# Patient Record
Sex: Female | Born: 1975
Health system: Southern US, Community
[De-identification: ages and names within clinical notes are randomized; demographics above are authoritative.]

## PROBLEM LIST (undated history)

## (undated) DIAGNOSIS — I10 Essential (primary) hypertension: Secondary | ICD-10-CM

## (undated) DIAGNOSIS — K219 Gastro-esophageal reflux disease without esophagitis: Secondary | ICD-10-CM

## (undated) DIAGNOSIS — K861 Other chronic pancreatitis: Secondary | ICD-10-CM

## (undated) DIAGNOSIS — Z765 Malingerer [conscious simulation]: Secondary | ICD-10-CM

## (undated) DIAGNOSIS — M1990 Unspecified osteoarthritis, unspecified site: Secondary | ICD-10-CM

## (undated) DIAGNOSIS — F329 Major depressive disorder, single episode, unspecified: Secondary | ICD-10-CM

## (undated) DIAGNOSIS — F32A Depression, unspecified: Secondary | ICD-10-CM

## (undated) DIAGNOSIS — F319 Bipolar disorder, unspecified: Secondary | ICD-10-CM

## (undated) DIAGNOSIS — M545 Low back pain, unspecified: Secondary | ICD-10-CM

## (undated) DIAGNOSIS — G8929 Other chronic pain: Secondary | ICD-10-CM

## (undated) DIAGNOSIS — E559 Vitamin D deficiency, unspecified: Secondary | ICD-10-CM

## (undated) DIAGNOSIS — M797 Fibromyalgia: Secondary | ICD-10-CM

## (undated) DIAGNOSIS — J45909 Unspecified asthma, uncomplicated: Secondary | ICD-10-CM

## (undated) DIAGNOSIS — E7800 Pure hypercholesterolemia, unspecified: Secondary | ICD-10-CM

## (undated) DIAGNOSIS — Z889 Allergy status to unspecified drugs, medicaments and biological substances status: Secondary | ICD-10-CM

## (undated) DIAGNOSIS — F419 Anxiety disorder, unspecified: Secondary | ICD-10-CM

## (undated) DIAGNOSIS — H5440 Blindness, one eye, unspecified eye: Secondary | ICD-10-CM

## (undated) DIAGNOSIS — R7303 Prediabetes: Secondary | ICD-10-CM

## (undated) DIAGNOSIS — D219 Benign neoplasm of connective and other soft tissue, unspecified: Secondary | ICD-10-CM

## (undated) HISTORY — PX: ENDOMETRIAL ABLATION: SHX621

## (undated) HISTORY — PX: EYE SURGERY: SHX253

## (undated) HISTORY — PX: WISDOM TOOTH EXTRACTION: SHX21

## (undated) HISTORY — DX: Benign neoplasm of connective and other soft tissue, unspecified: D21.9

## (undated) NOTE — *Deleted (*Deleted)
Family Medicine Teaching Service Daily Progress Note Intern Pager: [phone]  Patient name: Holly Mendez Medical record number: [id number] Date of birth: 06-13-1975 Age: 5 y.o. Gender: female  Primary Care Provider: Nestor Ramp, MD Consultants: None Code Status: Full  Pt Overview and Major Events to Date:  Admitted 12/01/2019  Assessment and Plan: Holly Mendez is a 82 y.o. female who presented with severe abdominal pain, nausea, vomiting and was admitted for IV fluid hydration and further evaluation.  Patient's past medical history significant for bipolar affective disorder, hypertension, morbid obesity, left eye blindness, chronic epigastric pain, vitamin D deficiency, chronic pancreatitis.   Abdominal pain ** Patient does not have any signs of infection at this time and no empiric antibiotics will be started. We will continue to treat patient as pancreatitis versus PUD with fluids, antiemetics (QTc 465), Protonix, and IV pain medications.   Continuous cardiac monitoring   Vitals per unit routine, O2 >92%, Activity OOB w/ assistance with sedating medciations   AM Labs: CMP, CBC    IVF while NPO   ADAT   IV Zofran PRN n/v   AM EKG for QTc   IV Morphine 2 mg x1, dose PRN given she is sleeping comfortably   ETOH pending   Chronic Abdominal Pain  Chronic pancreatitis  *** Chronic abdominal pain noted on progress note from March 2021 which spoke about outpatient EGD. No further notes are seen.  In 2018, there is a note that reports patient was previously seen by Dr. Elnoria Howard but that patient was not satisfied with [facility].  Dr. Myrtie Neither reviewed Dr. Haywood Pao records and noted that she has had work-up for constipation, pancreatitis and referred her to pain clinic earlier during the year.  He did not have anything further to add and politely declined to accept transfer of care.  No other notes seen in chart or care everywhere. 10/08/2015 Upper EUS with  Dr. Elnoria Howard showed pancreatic parenchymal abnormalities consisting of hyperechoic strands and lobularity were noted in the entire pancreas.  Home medications include: Nexium 40 mg daily, Pepcid 20 mg twice daily, Zofran as needed, Carafate 1 g 3 times daily AC nightly  Continue home medications when PO   Continue ETOH cessation   Hypertension *** Patient hypertensive in the emergency department 170's/80-90.  On her med list, there is clonidine patch that she wears once weekly (indication: Anger management per progress note 11/07/2017).  Patient reports she has not worn this for 3 weeks.  She is otherwise not on any other antihypertensive medications.  This is likely due to her pain.  Patient denies any chest pain, headaches, visual changes.  Previously on HCTZ. Previous admission notes states that she does not tolerate norvasc.   Will continue to monitor blood pressures.  Pain control   Bipolar affective  *** Per chart review, patient follows at [facility] mental health and receives her medications and therapy there.  Home medications include: Amitriptyline 75 mg, hydroxyzine 25 mg twice daily as needed, Latuda 80 mg daily.  Also takes clonidine patch for anger management issues.  Has not 1 patch in 3 weeks.  Continue home medications when PO   Hold clonidine patch  ETOH use  Patient reports hx of ETOH abuse. Last use last Wednesday per patient.   CIWA   ETOH pending   Substance abuse  Cocaine + on UDS. Hx of cocaine and ETOH use.   TOC consult for abuse resources   Vitamin D deficiency Takes 50,000 units twice  daily vitamin D  Hold while NPO   Asthma, mild intermittent Home medications include Symbicort and albuterol as needed.  Zyrtec 10 mg daily.  Hold while NPO   FEN/GI:   Fluids: LR 125 ml/hr   Electrolytes: replete PRN    Nutrition:  Clear liquid diet  VTE prophylaxis: Lovenox 40 (CrCl>30)   Status is: Observation  The patient remains OBS  appropriate and will d/c before 2 midnights.  Dispo: The patient is from: Home              Anticipated d/c is to: Home              Anticipated d/c date is: 1 day              Patient currently is not medically stable to d/c.        Subjective:  ***  Objective: Temp:  [97.6 F (36.4 C)-98.4 F (36.9 C)] 98.4 F (36.9 C) (11/23 0421) Pulse Rate:  [79-108] 92 (11/23 0421) Resp:  [13-29] 18 (11/23 0421) BP: (101-184)/(70-107) 116/71 (11/23 0421) SpO2:  [97 %-100 %] 100 % (11/23 0421) Weight:  [161 kg] 115 kg (11/22 1021)  Physical Exam: General: *** Cardiovascular: *** Respiratory: *** Abdomen: *** Extremities: ***  Laboratory: Recent Labs  Lab 12/01/19 1010 12/02/19 0509  WBC 7.4 12.0*  HGB 12.7 12.5  HCT 38.9 37.8  PLT 240 296   Recent Labs  Lab 12/01/19 1010 12/02/19 0509  NA 139 138  K 3.5 3.6  CL 103 101  CO2 24 26  BUN 6 7  CREATININE 0.87 1.42*  CALCIUM 9.1 9.2  PROT 7.6 7.3  BILITOT 0.8 1.0  ALKPHOS 107 99  ALT 38 32  AST 34 26  GLUCOSE 134* 104*    Imaging/Diagnostic Tests:  DG ABDOMEN ACUTE WITH 1 VIEW CHEST 12/02/2019 FINDINGS: PA chest: Lungs are clear. Heart is upper normal in size with pulmonary vascularity normal. No adenopathy. Supine and upright abdomen: There is moderate stool in the colon. There is no bowel dilatation or air-fluid level to suggest bowel obstruction. No free air. Probable tiny phlebolith right mid pelvis. IMPRESSION: Moderate stool in colon. No bowel obstruction or free air. No edema or airspace opacity. Heart upper normal in size.  CT ABDOMEN AND PELVIS WITH CONTRAST 12/02/2019 FINDINGS: Hepatobiliary: No hepatic lesions or intrahepatic biliary dilatation. The gallbladder is normal. No common bile duct dilatation. The portal and hepatic veins are patent. Pancreas: No mass, inflammation or ductal dilatation. No peripancreatic fluid collections. Spleen: Normal size. No focal lesions. Small accessory  spleen in the splenic hilum. IMPRESSION: 1. No acute abdominal/pelvic findings, mass lesions or adenopathy. 2. Small stable hiatal hernia. 3. Moderate bladder distention ULTRASOUND ABDOMEN LIMITED RIGHT UPPER QUADRANT 12/02/2019 FINDINGS: Gallbladder: There multiple stones within the gallbladder. No gallbladder wall thickening or pericholecystic fluid. Evaluation for Murphy's sign was limited as the patient was medicated. Common bile duct: Diameter: 6 mm Liver: No focal lesion identified. Within normal limits in parenchymal echogenicity. Portal vein is patent on color Doppler imaging with normal direction of blood flow towards the liver. Other: None. IMPRESSION: Cholelithiasis without definite sonographic evidence of acute cholecystitis.    Fayette Pho, MD 12/02/2019, 8:17 AM PGY-1, [facility] Family Medicine FPTS Intern pager: [phone], text pages welcome

---
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
## 1996-01-10 HISTORY — PX: TUBAL LIGATION: SHX77

## 2000-07-26 ENCOUNTER — Emergency Department (HOSPITAL_COMMUNITY): Admission: EM | Admit: 2000-07-26 | Discharge: 2000-07-26 | Payer: Self-pay | Admitting: Emergency Medicine

## 2001-01-09 HISTORY — PX: DILATION AND CURETTAGE OF UTERUS: SHX78

## 2001-12-13 ENCOUNTER — Emergency Department (HOSPITAL_COMMUNITY): Admission: EM | Admit: 2001-12-13 | Discharge: 2001-12-13 | Payer: Self-pay | Admitting: Emergency Medicine

## 2002-03-20 ENCOUNTER — Emergency Department (HOSPITAL_COMMUNITY): Admission: EM | Admit: 2002-03-20 | Discharge: 2002-03-20 | Payer: Self-pay | Admitting: Emergency Medicine

## 2002-03-22 ENCOUNTER — Emergency Department (HOSPITAL_COMMUNITY): Admission: EM | Admit: 2002-03-22 | Discharge: 2002-03-22 | Payer: Self-pay | Admitting: Emergency Medicine

## 2002-03-23 ENCOUNTER — Emergency Department (HOSPITAL_COMMUNITY): Admission: EM | Admit: 2002-03-23 | Discharge: 2002-03-24 | Payer: Self-pay | Admitting: Emergency Medicine

## 2003-01-10 ENCOUNTER — Emergency Department (HOSPITAL_COMMUNITY): Admission: EM | Admit: 2003-01-10 | Discharge: 2003-01-11 | Payer: Self-pay

## 2004-09-28 ENCOUNTER — Emergency Department (HOSPITAL_COMMUNITY): Admission: EM | Admit: 2004-09-28 | Discharge: 2004-09-28 | Payer: Self-pay | Admitting: Emergency Medicine

## 2005-03-14 ENCOUNTER — Ambulatory Visit: Payer: Self-pay | Admitting: Family Medicine

## 2005-04-04 ENCOUNTER — Ambulatory Visit: Payer: Self-pay | Admitting: Family Medicine

## 2005-04-07 ENCOUNTER — Ambulatory Visit: Payer: Self-pay | Admitting: *Deleted

## 2005-05-22 ENCOUNTER — Emergency Department (HOSPITAL_COMMUNITY): Admission: EM | Admit: 2005-05-22 | Discharge: 2005-05-23 | Payer: Self-pay | Admitting: Emergency Medicine

## 2005-05-24 ENCOUNTER — Ambulatory Visit: Payer: Self-pay | Admitting: Family Medicine

## 2005-05-26 ENCOUNTER — Ambulatory Visit: Payer: Self-pay | Admitting: Family Medicine

## 2005-06-05 ENCOUNTER — Inpatient Hospital Stay (HOSPITAL_COMMUNITY): Admission: AD | Admit: 2005-06-05 | Discharge: 2005-06-05 | Payer: Self-pay | Admitting: Gynecology

## 2005-06-29 ENCOUNTER — Ambulatory Visit: Payer: Self-pay | Admitting: Obstetrics & Gynecology

## 2005-10-26 ENCOUNTER — Ambulatory Visit: Payer: Self-pay | Admitting: Obstetrics and Gynecology

## 2005-12-21 ENCOUNTER — Ambulatory Visit: Payer: Self-pay | Admitting: Obstetrics and Gynecology

## 2005-12-21 ENCOUNTER — Ambulatory Visit (HOSPITAL_COMMUNITY): Admission: RE | Admit: 2005-12-21 | Discharge: 2005-12-21 | Payer: Self-pay | Admitting: Obstetrics and Gynecology

## 2006-12-13 ENCOUNTER — Emergency Department (HOSPITAL_COMMUNITY): Admission: EM | Admit: 2006-12-13 | Discharge: 2006-12-13 | Payer: Self-pay | Admitting: Emergency Medicine

## 2009-04-04 ENCOUNTER — Emergency Department (HOSPITAL_COMMUNITY): Admission: EM | Admit: 2009-04-04 | Discharge: 2009-04-04 | Payer: Self-pay | Admitting: Family Medicine

## 2009-08-29 ENCOUNTER — Emergency Department (HOSPITAL_COMMUNITY): Admission: EM | Admit: 2009-08-29 | Discharge: 2009-08-29 | Payer: Self-pay | Admitting: Emergency Medicine

## 2009-08-30 ENCOUNTER — Emergency Department (HOSPITAL_COMMUNITY): Admission: EM | Admit: 2009-08-30 | Discharge: 2009-08-30 | Payer: Self-pay | Admitting: Emergency Medicine

## 2009-09-02 ENCOUNTER — Ambulatory Visit: Payer: Self-pay | Admitting: Psychiatry

## 2009-09-02 ENCOUNTER — Observation Stay (HOSPITAL_COMMUNITY): Admission: EM | Admit: 2009-09-02 | Discharge: 2009-09-03 | Payer: Self-pay | Admitting: Emergency Medicine

## 2009-10-05 ENCOUNTER — Emergency Department (HOSPITAL_COMMUNITY): Admission: EM | Admit: 2009-10-05 | Discharge: 2009-10-05 | Payer: Self-pay | Admitting: Family Medicine

## 2009-11-19 ENCOUNTER — Ambulatory Visit: Payer: Self-pay | Admitting: Psychiatry

## 2010-03-24 LAB — CBC
HCT: 39.5 % (ref 36.0–46.0)
MCH: 28.4 pg (ref 26.0–34.0)
MCHC: 34.9 g/dL (ref 30.0–36.0)
MCV: 81.3 fL (ref 78.0–100.0)
Platelets: 266 10*3/uL (ref 150–400)
WBC: 11.5 10*3/uL — ABNORMAL HIGH (ref 4.0–10.5)

## 2010-03-24 LAB — URINE CULTURE: Culture  Setup Time: 201108222324

## 2010-03-24 LAB — DIFFERENTIAL
Basophils Absolute: 0 10*3/uL (ref 0.0–0.1)
Eosinophils Absolute: 0 10*3/uL (ref 0.0–0.7)
Monocytes Absolute: 1.2 10*3/uL — ABNORMAL HIGH (ref 0.1–1.0)
Monocytes Relative: 11 % (ref 3–12)
Neutro Abs: 7.9 10*3/uL — ABNORMAL HIGH (ref 1.7–7.7)
Neutrophils Relative %: 69 % (ref 43–77)

## 2010-03-24 LAB — COMPREHENSIVE METABOLIC PANEL
ALT: 37 U/L — ABNORMAL HIGH (ref 0–35)
Albumin: 3.5 g/dL (ref 3.5–5.2)
BUN: 9 mg/dL (ref 6–23)
CO2: 31 mEq/L (ref 19–32)
Calcium: 9 mg/dL (ref 8.4–10.5)
Chloride: 99 mEq/L (ref 96–112)
Creatinine, Ser: 0.82 mg/dL (ref 0.4–1.2)
Glucose, Bld: 124 mg/dL — ABNORMAL HIGH (ref 70–99)
Potassium: 3.9 mEq/L (ref 3.5–5.1)
Sodium: 138 mEq/L (ref 135–145)
Total Protein: 7.4 g/dL (ref 6.0–8.3)

## 2010-03-24 LAB — URINALYSIS, ROUTINE W REFLEX MICROSCOPIC
Hgb urine dipstick: NEGATIVE
Ketones, ur: NEGATIVE mg/dL
Nitrite: NEGATIVE
Protein, ur: 30 mg/dL — AB
Specific Gravity, Urine: 1.031 — ABNORMAL HIGH (ref 1.005–1.030)
pH: 7.5 (ref 5.0–8.0)

## 2010-03-24 LAB — LIPASE, BLOOD: Lipase: 39 U/L (ref 11–59)

## 2010-03-24 LAB — URINE MICROSCOPIC-ADD ON

## 2010-03-25 LAB — POCT PREGNANCY, URINE
Preg Test, Ur: NEGATIVE
Preg Test, Ur: NEGATIVE

## 2010-03-25 LAB — URINALYSIS, ROUTINE W REFLEX MICROSCOPIC
Bilirubin Urine: NEGATIVE
Glucose, UA: NEGATIVE mg/dL
Hgb urine dipstick: NEGATIVE
Nitrite: NEGATIVE
Specific Gravity, Urine: 1.02 (ref 1.005–1.030)
Urobilinogen, UA: 0.2 mg/dL (ref 0.0–1.0)
pH: 7 (ref 5.0–8.0)

## 2010-03-25 LAB — CBC
HCT: 40 % (ref 36.0–46.0)
HCT: 41.4 % (ref 36.0–46.0)
Hemoglobin: 13.8 g/dL (ref 12.0–15.0)
Hemoglobin: 14.1 g/dL (ref 12.0–15.0)
MCH: 28.4 pg (ref 26.0–34.0)
MCHC: 33.8 g/dL (ref 30.0–36.0)
MCHC: 33.9 g/dL (ref 30.0–36.0)
Platelets: 265 10*3/uL (ref 150–400)
RBC: 4.81 MIL/uL (ref 3.87–5.11)
RDW: 14.8 % (ref 11.5–15.5)
WBC: 11.1 10*3/uL — ABNORMAL HIGH (ref 4.0–10.5)

## 2010-03-25 LAB — COMPREHENSIVE METABOLIC PANEL
ALT: 35 U/L (ref 0–35)
ALT: 38 U/L — ABNORMAL HIGH (ref 0–35)
AST: 26 U/L (ref 0–37)
AST: 26 U/L (ref 0–37)
Alkaline Phosphatase: 76 U/L (ref 39–117)
Alkaline Phosphatase: 89 U/L (ref 39–117)
CO2: 29 mEq/L (ref 19–32)
Calcium: 8.7 mg/dL (ref 8.4–10.5)
Calcium: 9.1 mg/dL (ref 8.4–10.5)
Calcium: 9.4 mg/dL (ref 8.4–10.5)
Chloride: 95 mEq/L — ABNORMAL LOW (ref 96–112)
Chloride: 99 mEq/L (ref 96–112)
GFR calc Af Amer: >60 mL/min (ref >60)
GFR calc Af Amer: >60 mL/min (ref >60)
GFR calc Af Amer: >60 mL/min (ref >60)
GFR calc non Af Amer: >60 mL/min (ref >60)
GFR calc non Af Amer: >60 mL/min (ref >60)
GFR calc non Af Amer: >60 mL/min (ref >60)
Glucose, Bld: 113 mg/dL — ABNORMAL HIGH (ref 70–99)
Glucose, Bld: 135 mg/dL — ABNORMAL HIGH (ref 70–99)
Potassium: 3 mEq/L — ABNORMAL LOW (ref 3.5–5.1)
Potassium: 3.4 mEq/L — ABNORMAL LOW (ref 3.5–5.1)
Potassium: 3.8 mEq/L (ref 3.5–5.1)
Sodium: 135 mEq/L (ref 135–145)
Total Bilirubin: 0.6 mg/dL (ref 0.3–1.2)
Total Bilirubin: 0.7 mg/dL (ref 0.3–1.2)

## 2010-03-25 LAB — LIPASE, BLOOD
Lipase: 27 U/L (ref 11–59)
Lipase: 97 U/L — ABNORMAL HIGH (ref 11–59)

## 2010-03-25 LAB — URINE MICROSCOPIC-ADD ON

## 2010-03-25 LAB — DIFFERENTIAL
Basophils Absolute: 0.1 10*3/uL (ref 0.0–0.1)
Basophils Relative: 0 % (ref 0–1)
Eosinophils Absolute: 0 10*3/uL (ref 0.0–0.7)
Eosinophils Absolute: 0 10*3/uL (ref 0.0–0.7)
Eosinophils Relative: 0 % (ref 0–5)
Lymphocytes Relative: 13 % (ref 12–46)
Lymphocytes Relative: 31 % (ref 12–46)
Lymphs Abs: 1.2 10*3/uL (ref 0.7–4.0)
Lymphs Abs: 4 10*3/uL (ref 0.7–4.0)
Monocytes Absolute: 0.1 10*3/uL (ref 0.1–1.0)
Monocytes Absolute: 1.4 10*3/uL — ABNORMAL HIGH (ref 0.1–1.0)
Monocytes Relative: 2 % — ABNORMAL LOW (ref 3–12)
Neutro Abs: 7.2 10*3/uL (ref 1.7–7.7)
Neutro Abs: 7.9 10*3/uL — ABNORMAL HIGH (ref 1.7–7.7)
Neutrophils Relative %: 86 % — ABNORMAL HIGH (ref 43–77)

## 2010-03-25 LAB — CULTURE, BLOOD (ROUTINE X 2): Culture: NO GROWTH

## 2010-03-25 LAB — RAPID URINE DRUG SCREEN, HOSP PERFORMED
Amphetamines: NOT DETECTED
Barbiturates: NOT DETECTED
Opiates: NOT DETECTED

## 2010-03-25 LAB — PHOSPHORUS: Phosphorus: 3.1 mg/dL (ref 2.3–4.6)

## 2010-03-25 LAB — URINE CULTURE: Culture  Setup Time: 201108261756

## 2010-03-25 LAB — ETHANOL: Alcohol, Ethyl (B): 5 mg/dL (ref 0–10)

## 2010-03-25 LAB — MAGNESIUM: Magnesium: 2.6 mg/dL — ABNORMAL HIGH (ref 1.5–2.5)

## 2010-04-01 LAB — CBC
MCHC: 33.5 g/dL (ref 30.0–36.0)
Platelets: 268 10*3/uL (ref 150–400)
RDW: 15.3 % (ref 11.5–15.5)

## 2010-04-01 LAB — COMPREHENSIVE METABOLIC PANEL
ALT: 27 U/L (ref 0–35)
AST: 26 U/L (ref 0–37)
Calcium: 9.1 mg/dL (ref 8.4–10.5)
GFR calc Af Amer: >60 mL/min (ref >60)
Sodium: 138 mEq/L (ref 135–145)
Total Protein: 7.9 g/dL (ref 6.0–8.3)

## 2010-04-01 LAB — POCT URINALYSIS DIP (DEVICE)
Nitrite: NEGATIVE
Protein, ur: NEGATIVE mg/dL
pH: 6 (ref 5.0–8.0)

## 2010-04-01 LAB — LIPASE, BLOOD: Lipase: 20 U/L (ref 11–59)

## 2010-05-27 NOTE — Op Note (Signed)
[name], Holly Mendez           ACCOUNT NO.:  [id number]   MEDICAL RECORD NO.:  [id number]          PATIENT TYPE:  AMB   LOCATION:  SDC                           [facility]  [facility]   PHYSICIAN:  Phil D. Okey Dupre, M.D.     DATE OF BIRTH:  08-Nov-1975   DATE OF PROCEDURE:  12/21/2005  DATE OF DISCHARGE:                               OPERATIVE REPORT   PROCEDURE:  Hydrothermal ablation of the endometrium.   POSTOPERATIVE DIAGNOSIS:  Intractable menometrorrhagia with secondary  anemia.   SURGEON:  Dr. Okey Dupre   ANESTHESIA:  General.   SPECIMENS TO PATHOLOGY:  None.   POSTOPERATIVE CONDITION:  Satisfactory.   ESTIMATED BLOOD LOSS:  Under satisfactory general anesthesia with the  patient in dorsal lithotomy position, perineum, vagina were prepped and  draped in usual sterile manner.  Bimanual pelvic examination under  anesthesia revealed the uterus of normal size, shape, consistency,  anteflexed, freely movable with a free cul-de-sac. Adnexa could not be  well palpated. A weighted speculum was placed in the posterior  fourchette of the vagina through a marital introitus, BUS within normal  limits.  Vagina is clean and well rugated. The cervix was clean and  parous and grasped with single-tooth tenaculum.  The uterine cavity was  sounded to a depth of 8 cm. Cervical os dilated to a #8 Hagar dilator  and the endometrial ablation device inserted with a hysteroscope into  the uterine cavity where the entire cavity was easily visualized.  Both  ostia were seen with no sign of any leiomyomata that could be  identified.  There was a thickened endometrium with many hyperemic  areas. The usual precautions were used to make sure there was no  leakage. The circulating hydrothermal ablation machine was put into  effect and at 85-90 degrees cGy Celsius the ablation took place for 10  minutes with 1-minute cooling down period.  There was no sign of any  burning or injury to any other organ. Blanching  occurred to the entire  endometrium which was easily visualized. The tenaculum and speculum  removed from vagina.  The patient was transferred to recovery room in  satisfactory condition having tolerated procedure well.           ______________________________  Holly Mendez. Okey Dupre, M.D.     PDR/MEDQ  D:  12/21/2005  T:  12/21/2005  Job:  [id number]

## 2010-05-27 NOTE — Group Therapy Note (Signed)
NAME:  Holly Mendez, Holly Mendez NO.:  [id number]   MEDICAL RECORD NO.:  [id number]          PATIENT TYPE:  WOC   LOCATION:  [facility]                   FACILITY:  [facility]   PHYSICIAN:  Argentina Donovan, MD        DATE OF BIRTH:  [birth year]   DATE OF SERVICE:  10/26/2005                                    CLINIC NOTE   The patient is a 35 year old, African-American, gravida 3, para 3-0-0-3, who  has had persistent dysfunctional uterine bleeding, saw Dr. Perlie Gold in June,  and was placed on Loestrin to control the bleeding that did control the  bleeding.  The patient is back but since she ran out she has continued to  bleed again.  She has had a tubal ligation, she does not want to take  hormones anymore, and we offered her endometrial ablation as an alternative,  and she has decided this is what she wants to try.  We are going to have her  admitted for thermal hydroablation of the endometrium.  The patient has a  fairly good medical history.  She is ALLERGIC TO ZITHROMAX and NORVASC.  She  does not smoke, drink, or take illicit drugs, Pap smear normal in March of  2007.  The medicines she takes are Trazodone and Prolixin.  She weighs 225  pounds and is 5 foot 4 inches tall.   DIAGNOSIS:  Persistent dysfunctional uterine bleeding.           ______________________________  Argentina Donovan, MD     PR/MEDQ  D:  10/26/2005  T:  10/28/2005  Job:  [id number]

## 2010-05-27 NOTE — Group Therapy Note (Signed)
Holly Mendez, Holly Mendez NO.:  [id number]   MEDICAL RECORD NO.:  [id number]          PATIENT TYPE:  WOC   LOCATION:  [facility]                   [facility]ACILITY:  [facility]   PHYSICIAN:  Dorthula Perfect, MD     DATE OF BIRTH:  1976-01-06   DATE OF SERVICE:                                    CLINIC NOTE   HISTORY OF PRESENT ILLNESS:  This is a 35 year old African-American female,  gravida 4, para 3, abort of 1 who was seen in the MAU on Jun 05, 2005 with a  history of vaginal bleeding since May 14, 2005.  Prior to that, she had had  normal, cyclic menstrual periods.  She had been seen at [facility]  on May 22, 2004, and was given Provera to take for 10 days.  Her evaluation  on Jun 05, 2005 included a negative pregnancy test.  It included a  hemoglobin of 9.9 gm and a pelvic ultrasound.  The uterus was of normal  size.  The endometrial stripe was upper limits of normal.  Ovaries were  normal.  She was placed on Loestrin 24 Fe to take daily.  She has started  the last 4 brown pills, and will finish those Saturday.  Her bleeding  stopped about a week and a half ago.   PAST MEDICAL HISTORY:  Noncontributory.   REVIEW OF SYSTEMS:  Noncontributory.   PHYSICAL EXAMINATION:  ABDOMEN:  Soft, nontender, and somewhat obese.  PELVIC:  External genitalia and BUS glands are normal.  Vaginal wall was  epithelialized as was the cervix.  She is starting her menstrual period.  The uterus is in the upper limits of normal size.  The ovaries are not  palpable.  There are no separate masses.   DIAGNOSIS:  Abnormal uterine bleeding.   DISPOSITION:  Patient will continue Loestrin 58 Fe for another 3 cycles.  She is given samples.  She will be seen again here in 4 months for followup.  I anticipate that this will correct her problem.           ______________________________  Dorthula Perfect, MD     ER/MEDQ  D:  06/29/2005  T:  06/29/2005  Job:  [id number]

## 2011-01-10 HISTORY — PX: UPPER GASTROINTESTINAL ENDOSCOPY: SHX188

## 2011-01-10 HISTORY — PX: ESOPHAGOGASTRODUODENOSCOPY: SHX1529

## 2011-02-03 ENCOUNTER — Emergency Department (HOSPITAL_COMMUNITY): Payer: Medicaid Other

## 2011-02-03 ENCOUNTER — Emergency Department (INDEPENDENT_AMBULATORY_CARE_PROVIDER_SITE_OTHER)
Admission: EM | Admit: 2011-02-03 | Discharge: 2011-02-03 | Disposition: A | Discharge status: Nursing Home (Includes ICF) | Payer: Self-pay | Source: Home / Self Care

## 2011-02-03 ENCOUNTER — Encounter (HOSPITAL_COMMUNITY): Payer: Self-pay | Admitting: Emergency Medicine

## 2011-02-03 ENCOUNTER — Emergency Department (HOSPITAL_COMMUNITY)
Admission: EM | Admit: 2011-02-03 | Discharge: 2011-02-03 | Disposition: A | Discharge status: Home / Self Care | Payer: Medicaid Other | Attending: Emergency Medicine | Admitting: Emergency Medicine

## 2011-02-03 ENCOUNTER — Encounter (HOSPITAL_COMMUNITY): Payer: Self-pay

## 2011-02-03 DIAGNOSIS — R112 Nausea with vomiting, unspecified: Secondary | ICD-10-CM | POA: Insufficient documentation

## 2011-02-03 DIAGNOSIS — K859 Acute pancreatitis without necrosis or infection, unspecified: Secondary | ICD-10-CM

## 2011-02-03 DIAGNOSIS — R1032 Left lower quadrant pain: Secondary | ICD-10-CM | POA: Insufficient documentation

## 2011-02-03 DIAGNOSIS — F319 Bipolar disorder, unspecified: Secondary | ICD-10-CM | POA: Insufficient documentation

## 2011-02-03 DIAGNOSIS — R109 Unspecified abdominal pain: Secondary | ICD-10-CM

## 2011-02-03 DIAGNOSIS — I10 Essential (primary) hypertension: Secondary | ICD-10-CM | POA: Insufficient documentation

## 2011-02-03 DIAGNOSIS — Z79899 Other long term (current) drug therapy: Secondary | ICD-10-CM | POA: Insufficient documentation

## 2011-02-03 HISTORY — DX: Bipolar disorder, unspecified: F31.9

## 2011-02-03 HISTORY — DX: Essential (primary) hypertension: I10

## 2011-02-03 LAB — COMPREHENSIVE METABOLIC PANEL
ALT: 20 U/L (ref 0–35)
AST: 19 U/L (ref 0–37)
Albumin: 3.9 g/dL (ref 3.5–5.2)
Alkaline Phosphatase: 92 U/L (ref 39–117)
BUN: 8 mg/dL (ref 6–23)
Chloride: 98 mEq/L (ref 96–112)
Potassium: 3.8 mEq/L (ref 3.5–5.1)
Sodium: 136 mEq/L (ref 135–145)
Total Bilirubin: 0.2 mg/dL — ABNORMAL LOW (ref 0.3–1.2)
Total Protein: 8.4 g/dL — ABNORMAL HIGH (ref 6.0–8.3)

## 2011-02-03 LAB — CBC
Hemoglobin: 13.9 g/dL (ref 12.0–15.0)
MCH: 27.6 pg (ref 26.0–34.0)
MCHC: 34.5 g/dL (ref 30.0–36.0)
Platelets: 318 10*3/uL (ref 150–400)
RDW: 15 % (ref 11.5–15.5)

## 2011-02-03 LAB — URINALYSIS, ROUTINE W REFLEX MICROSCOPIC
Bilirubin Urine: NEGATIVE
Glucose, UA: NEGATIVE mg/dL
Ketones, ur: NEGATIVE mg/dL
Leukocytes, UA: NEGATIVE
Protein, ur: 30 mg/dL — AB
Specific Gravity, Urine: 1.023 (ref 1.005–1.030)
Urobilinogen, UA: 0.2 mg/dL (ref 0.0–1.0)
pH: 8.5 — ABNORMAL HIGH (ref 5.0–8.0)

## 2011-02-03 LAB — DIFFERENTIAL
Basophils Absolute: 0 10*3/uL (ref 0.0–0.1)
Basophils Relative: 0 % (ref 0–1)
Eosinophils Absolute: 0.2 10*3/uL (ref 0.0–0.7)
Monocytes Relative: 6 % (ref 3–12)
Neutro Abs: 6.2 10*3/uL (ref 1.7–7.7)
Neutrophils Relative %: 60 % (ref 43–77)

## 2011-02-03 LAB — LIPASE, BLOOD: Lipase: 269 U/L — ABNORMAL HIGH (ref 11–59)

## 2011-02-03 LAB — URINE MICROSCOPIC-ADD ON

## 2011-02-03 LAB — PREGNANCY, URINE: Preg Test, Ur: NEGATIVE

## 2011-02-03 MED ORDER — ONDANSETRON HCL 4 MG/2ML IJ SOLN
4.0000 mg | Freq: Once | INTRAMUSCULAR | Status: AC
  Administered 2011-02-03 (×2): 4 mg via INTRAVENOUS

## 2011-02-03 MED ORDER — ONDANSETRON HCL 4 MG/2ML IJ SOLN
INTRAMUSCULAR | Status: AC
  Administered 2011-02-03: 4 mg
  Filled 2011-02-03: qty 2

## 2011-02-03 MED ORDER — PROMETHAZINE HCL 25 MG PO TABS: 25.0000 mg | ORAL_TABLET | Freq: Three times a day (TID) | ORAL | Status: DC | PRN

## 2011-02-03 MED ORDER — IOHEXOL 300 MG/ML  SOLN
100.0000 mL | Freq: Once | INTRAMUSCULAR | Status: AC | PRN
  Administered 2011-02-03: 100 mL via INTRAVENOUS

## 2011-02-03 MED ORDER — ONDANSETRON HCL 4 MG/2ML IJ SOLN
INTRAMUSCULAR | Status: AC
  Filled 2011-02-03: qty 2

## 2011-02-03 MED ORDER — HYDROMORPHONE HCL PF 1 MG/ML IJ SOLN
1.0000 mg | Freq: Once | INTRAMUSCULAR | Status: AC
  Administered 2011-02-03: 1 mg via INTRAVENOUS

## 2011-02-03 MED ORDER — ONDANSETRON HCL 4 MG/2ML IJ SOLN
INTRAMUSCULAR | Status: AC
  Filled 2011-02-03: qty 4

## 2011-02-03 MED ORDER — HYDROMORPHONE HCL PF 2 MG/ML IJ SOLN
INTRAMUSCULAR | Status: AC
  Administered 2011-02-03: 2 mg
  Filled 2011-02-03: qty 1

## 2011-02-03 MED ORDER — ONDANSETRON HCL 4 MG/2ML IJ SOLN
INTRAMUSCULAR | Status: AC
  Administered 2011-02-03: 4 mg via INTRAVENOUS
  Filled 2011-02-03: qty 2

## 2011-02-03 MED ORDER — ONDANSETRON HCL 4 MG/2ML IJ SOLN
8.0000 mg | Freq: Once | INTRAMUSCULAR | Status: AC
  Administered 2011-02-03: 8 mg via INTRAMUSCULAR

## 2011-02-03 MED ORDER — HYDROMORPHONE HCL PF 1 MG/ML IJ SOLN
INTRAMUSCULAR | Status: AC
  Filled 2011-02-03: qty 1

## 2011-02-03 MED ORDER — OXYCODONE-ACETAMINOPHEN 5-325 MG PO TABS: 1.0000 | ORAL_TABLET | Freq: Four times a day (QID) | ORAL | Status: DC | PRN

## 2011-02-03 NOTE — ED Provider Notes (Signed)
History     CSN: [id number]  Arrival date & time 02/03/11  1038   First MD Initiated Contact with Patient 02/03/11 1048      Chief Complaint  Patient presents with  . Abdominal Pain    PMH of pancreatitis  . Nausea    actively vomiting    (Consider location/radiation/quality/duration/timing/severity/associated sxs/prior treatment) Patient is a 36 y.o. female presenting with abdominal pain. The history is provided by the patient.  Abdominal Pain The primary symptoms of the illness include abdominal pain, nausea and vomiting. The primary symptoms of the illness do not include fever, shortness of breath or hematochezia. The current episode started 2 days ago. The onset of the illness was gradual. The problem has not changed since onset. Associated with: She reports the last alcohol use was 6 months ago. The patient states that she believes she is currently not pregnant. The patient has had a change in bowel habit (She report 3 loose bowel movements today. No melena, no blood.). Symptoms associated with the illness do not include chills. Associated symptoms comments: She has a reported history of pancreatitis and feels the same as today's pain "almost". When asked, she points to the lower LQ abdomen as pain area. .    Past Medical History  Diagnosis Date  . Hypertension   . Pancreatitis   . Bipolar disorder   . Schizoaffective disorder     Past Surgical History  Procedure Date  . Tubal ligation   . Appendectomy     History reviewed. No pertinent family history.  History  Substance Use Topics  . Smoking status: Passive Smoker  . Smokeless tobacco: Not on file  . Alcohol Use: No     NO ETOH FOR 6 MONTHS    OB History    Grav Para Term Preterm Abortions TAB SAB Ect Mult Living                  Review of Systems  Constitutional: Negative for fever and chills.  HENT: Negative.   Respiratory: Negative.  Negative for shortness of breath.   Cardiovascular: Negative.     Gastrointestinal: Positive for nausea, vomiting and abdominal pain. Negative for hematochezia.  Musculoskeletal: Negative.   Skin: Negative.   Neurological: Negative.     Allergies  Norvasc and Zithromax  Home Medications   Current Outpatient Rx  Name Route Sig Dispense Refill  . BENZTROPINE MESYLATE 1 MG PO TABS Oral Take 0.5 mg by mouth 2 (two) times daily.    Marland Kitchen DIVALPROEX SODIUM 250 MG PO TBEC Oral Take 750 mg by mouth every evening.    Marland Kitchen FLUPHENAZINE DECANOATE 25 MG/ML IJ SOLN Intramuscular Inject 12.5 mg into the muscle every 14 (fourteen) days. 0.5 cc    . HYDROXYZINE HCL 25 MG PO TABS Oral Take 75 mg by mouth every evening.    Marland Kitchen LURASIDONE HCL 80 MG PO TABS Oral Take 80 mg by mouth every evening.       Pulse 75  Temp(Src) 97.6 F (36.4 C) (Oral)  Resp 18  Ht 5\' 5"  (1.651 m)  Wt 275 lb (124.739 kg)  BMI 45.76 kg/m2  SpO2 100%  LMP 12/04/2010  Physical Exam  Constitutional: She appears well-developed and well-nourished.  HENT:  Head: Normocephalic.  Neck: Normal range of motion. Neck supple.  Cardiovascular: Normal rate and regular rhythm.   Pulmonary/Chest: Effort normal and breath sounds normal.  Abdominal: Soft. She exhibits no mass. Bowel sounds are decreased. There is no rebound, no  guarding and no CVA tenderness.    Musculoskeletal: Normal range of motion.  Neurological: She is alert. No cranial nerve deficit.  Skin: Skin is warm and dry. No rash noted.  Psychiatric: She has a normal mood and affect.    ED Course  Procedures (including critical care time) Pain has been controlled with medications. No further vomiting in ED. She is drinking contrast for CT scan.  Labs Reviewed - No data to display No results found.   No diagnosis found.    MDM   Tenderness is in LLQ abdomen with mild tenderness in LUQ. Concerned for pathology other than pancreatitis. CT scan pending.        Rodena Medin, PA-C 02/03/11 1453

## 2011-02-03 NOTE — ED Provider Notes (Signed)
History     CSN: [id number]  Arrival date & time 02/03/11  4098   None     Chief Complaint  Patient presents with  . Abdominal Pain  . Emesis    (Consider location/radiation/quality/duration/timing/severity/associated sxs/prior treatment) HPI Comments: Patient presents with onset of left upper quadrant abdominal pain, nausea, vomiting, and diarrhea for 3 days. She denies fever. She states her symptoms are progressing, and she is unable to keep even water down at this time. She states that this feels just like when she had a flare of pancreatitis in the past. She states she was hospitalized with her last pancreatitis flareup which was in August of 2011.   Past Medical History  Diagnosis Date  . Hypertension   . Pancreatitis   . Bipolar disorder   . Schizoaffective disorder     Past Surgical History  Procedure Date  . Tubal ligation     History reviewed. No pertinent family history.  History  Substance Use Topics  . Smoking status: Never Smoker   . Smokeless tobacco: Not on file  . Alcohol Use: No    OB History    Grav Para Term Preterm Abortions TAB SAB Ect Mult Living                  Review of Systems  Constitutional: Positive for chills. Negative for fever and fatigue.  Respiratory: Negative for cough, shortness of breath and wheezing.   Cardiovascular: Positive for palpitations. Negative for chest pain.  Gastrointestinal: Positive for nausea, vomiting, abdominal pain and diarrhea.  Genitourinary: Negative for decreased urine volume.    Allergies  Norvasc and Zithromax  Home Medications   Current Outpatient Rx  Name Route Sig Dispense Refill  . BENZTROPINE MESYLATE 1 MG PO TABS Oral Take 0.5 mg by mouth 2 (two) times daily.    Marland Kitchen DIVALPROEX SODIUM 250 MG PO TBEC Oral Take 750 mg by mouth every evening.    Marland Kitchen FLUPHENAZINE DECANOATE 25 MG/ML IJ SOLN Intramuscular Inject into the muscle every 14 (fourteen) days. 0.5 cc    . HYDROXYZINE HCL 25 MG PO TABS  Oral Take 75 mg by mouth every evening.    Marland Kitchen LURASIDONE HCL 80 MG PO TABS Oral Take by mouth every evening.      BP 122/92  Pulse 78  Temp(Src) 97.7 F (36.5 C) (Oral)  Resp 20  SpO2 100%  Physical Exam  Nursing note and vitals reviewed. Constitutional: She appears well-developed and well-nourished. She appears ill. No distress.  HENT:  Head: Normocephalic and atraumatic.  Right Ear: Tympanic membrane, external ear and ear canal normal.  Left Ear: Tympanic membrane, external ear and ear canal normal.  Nose: Nose normal.  Mouth/Throat: Uvula is midline, oropharynx is clear and moist and mucous membranes are normal. No oropharyngeal exudate, posterior oropharyngeal edema or posterior oropharyngeal erythema.  Neck: Neck supple.  Cardiovascular: Normal rate, regular rhythm and normal heart sounds.   Pulmonary/Chest: Effort normal and breath sounds normal. No respiratory distress.  Abdominal: Soft. Bowel sounds are normal. She exhibits no mass. There is no hepatosplenomegaly. There is tenderness in the right upper quadrant, right lower quadrant, left upper quadrant and left lower quadrant. There is no rebound, no guarding and no CVA tenderness.  Lymphadenopathy:    She has no cervical adenopathy.  Neurological: She is alert.  Skin: Skin is warm and dry.  Psychiatric: She has a normal mood and affect.    ED Course  Procedures (including critical care time)  Labs Reviewed - No data to display No results found.   1. Abdominal pain   2. Nausea vomiting and diarrhea       MDM  Pt transferred to [facility], [place] 02/03/11 1015

## 2011-02-03 NOTE — ED Notes (Signed)
PA Cordelia Pen at [facility] for evaluation.

## 2011-02-03 NOTE — ED Provider Notes (Signed)
  Physical Exam  BP 109/65  Pulse 83  Temp(Src) 98.2 F (36.8 C) (Oral)  Resp 18  Ht 5\' 5"  (1.651 m)  Wt 275 lb (124.739 kg)  BMI 45.76 kg/m2  SpO2 98%  LMP 12/04/2010  Physical Exam  ED Course  Procedures  MDM ASKED SHE FELT THAT SHE COULD GO HOME AND SHE SAYS SHE FEELS LIKE SHE COULD.  I advised the patient that we will send her home with pain control and antiemetics.  She is comfortable with this plan and she understands that if her pain in her condition worsens she will need to come back to the emergency room for further evaluation.  The patient has been tolerating oral fluids here in the emergency room.  Medical screening examination/treatment/procedure(s) were conducted as a shared visit with non-physician practitioner(s) and myself.  I personally evaluated the patient during the encounter I saw pt with Mr. Lawyer.  Pt was comfortable, not in pain.  Her lab tests showed an elevated lipase, CT of abdomen/pelvis negative.  Released with discharge instructions, and advised to return if pain recurred. Osvaldo Human, M.D.  Carlyle Dolly, PA-C 02/03/11 1828  Carleene Cooper III, MD 02/04/11 [phone]

## 2011-02-03 NOTE — ED Notes (Signed)
Patient tolerated PO challenge without incident. 

## 2011-02-03 NOTE — ED Notes (Signed)
C/o LUQ abdominal pain, diarrhea and vomiting for 3 days.  Denies fever.  States these sx are just like when she had pancreatitis in 2011.  Denies sick contacts, states she has been unable to keep medications down.

## 2011-02-03 NOTE — ED Provider Notes (Signed)
Medical screening examination/treatment/procedure(s) were performed by non-physician practitioner and as supervising physician I was immediately available for consultation/collaboration.   Glynn Octave, MD 02/03/11 [phone]

## 2011-02-03 NOTE — ED Notes (Addendum)
Pt given the first dose of PO contrast.  She c/o n/v  and abdominal pain at  8/10.Marland Kitchen  BSC placed in room for pt convenience d/t intermittent diarrhea

## 2011-02-03 NOTE — ED Notes (Signed)
Pt states that she began having N/V and severe abdominal pain two days ago that has gotten progressviely worse

## 2011-02-04 ENCOUNTER — Encounter (HOSPITAL_COMMUNITY): Payer: Self-pay | Admitting: *Deleted

## 2011-02-04 ENCOUNTER — Inpatient Hospital Stay (HOSPITAL_COMMUNITY)
Admission: EM | Admit: 2011-02-04 | Discharge: 2011-02-08 | DRG: 439 | Disposition: A | Discharge status: Home / Self Care | Payer: Medicaid Other | Attending: Internal Medicine | Admitting: Internal Medicine

## 2011-02-04 DIAGNOSIS — K859 Acute pancreatitis without necrosis or infection, unspecified: Secondary | ICD-10-CM | POA: Diagnosis present

## 2011-02-04 DIAGNOSIS — Z6841 Body Mass Index (BMI) 40.0 and over, adult: Secondary | ICD-10-CM

## 2011-02-04 DIAGNOSIS — Z23 Encounter for immunization: Secondary | ICD-10-CM

## 2011-02-04 DIAGNOSIS — Z79899 Other long term (current) drug therapy: Secondary | ICD-10-CM

## 2011-02-04 DIAGNOSIS — F319 Bipolar disorder, unspecified: Secondary | ICD-10-CM | POA: Diagnosis present

## 2011-02-04 DIAGNOSIS — I10 Essential (primary) hypertension: Secondary | ICD-10-CM | POA: Diagnosis present

## 2011-02-04 DIAGNOSIS — R1032 Left lower quadrant pain: Secondary | ICD-10-CM | POA: Diagnosis not present

## 2011-02-04 DIAGNOSIS — F3170 Bipolar disorder, currently in remission, most recent episode unspecified: Secondary | ICD-10-CM | POA: Insufficient documentation

## 2011-02-04 DIAGNOSIS — R112 Nausea with vomiting, unspecified: Secondary | ICD-10-CM | POA: Diagnosis present

## 2011-02-04 DIAGNOSIS — R1084 Generalized abdominal pain: Secondary | ICD-10-CM | POA: Diagnosis present

## 2011-02-04 DIAGNOSIS — F209 Schizophrenia, unspecified: Secondary | ICD-10-CM | POA: Diagnosis present

## 2011-02-04 LAB — COMPREHENSIVE METABOLIC PANEL
ALT: 16 U/L (ref 0–35)
Alkaline Phosphatase: 85 U/L (ref 39–117)
CO2: 26 mEq/L (ref 19–32)
Chloride: 98 mEq/L (ref 96–112)
GFR calc Af Amer: >90 mL/min (ref >90)
GFR calc non Af Amer: 84 mL/min — ABNORMAL LOW (ref >90)
Glucose, Bld: 156 mg/dL — ABNORMAL HIGH (ref 70–99)
Potassium: 3.9 mEq/L (ref 3.5–5.1)
Sodium: 134 mEq/L — ABNORMAL LOW (ref 135–145)
Total Bilirubin: 0.3 mg/dL (ref 0.3–1.2)

## 2011-02-04 LAB — URINALYSIS, ROUTINE W REFLEX MICROSCOPIC
Bilirubin Urine: NEGATIVE
Nitrite: NEGATIVE
Specific Gravity, Urine: 1.044 — ABNORMAL HIGH (ref 1.005–1.030)
pH: 6 (ref 5.0–8.0)

## 2011-02-04 LAB — CBC
Hemoglobin: 13.3 g/dL (ref 12.0–15.0)
MCH: 27.8 pg (ref 26.0–34.0)
RBC: 4.79 MIL/uL (ref 3.87–5.11)
WBC: 11.5 10*3/uL — ABNORMAL HIGH (ref 4.0–10.5)

## 2011-02-04 MED ORDER — FLUPHENAZINE DECANOATE 25 MG/ML IJ SOLN: 12.5000 mg | INTRAMUSCULAR | Status: DC

## 2011-02-04 MED ORDER — BENZTROPINE MESYLATE 0.5 MG PO TABS
0.5000 mg | ORAL_TABLET | Freq: Two times a day (BID) | ORAL | Status: DC
  Administered 2011-02-04 – 2011-02-08 (×7): 0.5 mg via ORAL
  Filled 2011-02-04 (×10): qty 1

## 2011-02-04 MED ORDER — PROMETHAZINE HCL 25 MG/ML IJ SOLN
12.5000 mg | INTRAMUSCULAR | Status: AC
  Administered 2011-02-04: 12.5 mg via INTRAVENOUS
  Filled 2011-02-04: qty 1

## 2011-02-04 MED ORDER — LURASIDONE HCL 80 MG PO TABS
80.0000 mg | ORAL_TABLET | Freq: Every evening | ORAL | Status: DC
  Administered 2011-02-04 – 2011-02-07 (×3): 80 mg via ORAL
  Filled 2011-02-04 (×5): qty 1

## 2011-02-04 MED ORDER — ALUM & MAG HYDROXIDE-SIMETH 200-200-20 MG/5ML PO SUSP: 30.0000 mL | Freq: Four times a day (QID) | ORAL | Status: DC | PRN

## 2011-02-04 MED ORDER — INFLUENZA VIRUS VACC SPLIT PF IM SUSP
0.5000 mL | INTRAMUSCULAR | Status: AC
  Administered 2011-02-05: 0.5 mL via INTRAMUSCULAR
  Filled 2011-02-04: qty 0.5

## 2011-02-04 MED ORDER — ACETAMINOPHEN 650 MG RE SUPP: 650.0000 mg | Freq: Four times a day (QID) | RECTAL | Status: DC | PRN

## 2011-02-04 MED ORDER — OXYCODONE HCL 5 MG PO TABS
5.0000 mg | ORAL_TABLET | ORAL | Status: DC | PRN
  Administered 2011-02-06: 5 mg via ORAL
  Filled 2011-02-04: qty 1

## 2011-02-04 MED ORDER — HYDROXYZINE HCL 50 MG PO TABS
50.0000 mg | ORAL_TABLET | Freq: Every evening | ORAL | Status: DC
  Administered 2011-02-04 – 2011-02-05 (×2): 75 mg via ORAL
  Administered 2011-02-07: 50 mg via ORAL
  Filled 2011-02-04 (×5): qty 2

## 2011-02-04 MED ORDER — HYDROMORPHONE HCL PF 1 MG/ML IJ SOLN
1.0000 mg | Freq: Once | INTRAMUSCULAR | Status: AC
  Administered 2011-02-04: 1 mg via INTRAVENOUS
  Filled 2011-02-04: qty 1

## 2011-02-04 MED ORDER — ONDANSETRON HCL 4 MG/2ML IJ SOLN
4.0000 mg | Freq: Four times a day (QID) | INTRAMUSCULAR | Status: DC | PRN
  Administered 2011-02-06 – 2011-02-07 (×4): 4 mg via INTRAVENOUS
  Filled 2011-02-04 (×4): qty 2

## 2011-02-04 MED ORDER — PROMETHAZINE HCL 25 MG/ML IJ SOLN
25.0000 mg | Freq: Four times a day (QID) | INTRAMUSCULAR | Status: DC | PRN
  Administered 2011-02-06 – 2011-02-07 (×3): 25 mg via INTRAVENOUS
  Filled 2011-02-04 (×3): qty 1

## 2011-02-04 MED ORDER — SODIUM CHLORIDE 0.9 % IV SOLN
INTRAVENOUS | Status: AC
  Administered 2011-02-04: 11:00:00 via INTRAVENOUS

## 2011-02-04 MED ORDER — PROMETHAZINE HCL 25 MG/ML IJ SOLN
25.0000 mg | Freq: Once | INTRAMUSCULAR | Status: AC
  Administered 2011-02-04: 25 mg via INTRAVENOUS
  Filled 2011-02-04: qty 1

## 2011-02-04 MED ORDER — ONDANSETRON HCL 4 MG PO TABS: 4.0000 mg | ORAL_TABLET | Freq: Four times a day (QID) | ORAL | Status: DC | PRN

## 2011-02-04 MED ORDER — ZOLPIDEM TARTRATE 5 MG PO TABS: 5.0000 mg | ORAL_TABLET | Freq: Every evening | ORAL | Status: DC | PRN

## 2011-02-04 MED ORDER — SODIUM CHLORIDE 0.9 % IV BOLUS (SEPSIS)
1000.0000 mL | Freq: Once | INTRAVENOUS | Status: AC
  Administered 2011-02-04: 1000 mL via INTRAVENOUS

## 2011-02-04 MED ORDER — HYDROMORPHONE HCL PF 1 MG/ML IJ SOLN
0.5000 mg | INTRAMUSCULAR | Status: DC | PRN
  Administered 2011-02-04 – 2011-02-05 (×5): 1 mg via INTRAVENOUS
  Filled 2011-02-04 (×5): qty 1

## 2011-02-04 MED ORDER — SODIUM CHLORIDE 0.9 % IV SOLN
INTRAVENOUS | Status: DC
  Administered 2011-02-04 – 2011-02-05 (×2): via INTRAVENOUS

## 2011-02-04 MED ORDER — ENOXAPARIN SODIUM 40 MG/0.4ML ~~LOC~~ SOLN
40.0000 mg | SUBCUTANEOUS | Status: DC
  Administered 2011-02-04 – 2011-02-08 (×5): 40 mg via SUBCUTANEOUS
  Filled 2011-02-04 (×5): qty 0.4

## 2011-02-04 MED ORDER — DIVALPROEX SODIUM ER 500 MG PO TB24
1500.0000 mg | ORAL_TABLET | Freq: Every day | ORAL | Status: DC
  Administered 2011-02-04 – 2011-02-07 (×3): 1500 mg via ORAL
  Filled 2011-02-04 (×5): qty 3

## 2011-02-04 MED ORDER — ACETAMINOPHEN 325 MG PO TABS: 650.0000 mg | ORAL_TABLET | Freq: Four times a day (QID) | ORAL | Status: DC | PRN

## 2011-02-04 NOTE — ED Provider Notes (Signed)
History     CSN: [id number]  Arrival date & time 02/04/11  0402   First MD Initiated Contact with Patient 02/04/11 0406      Chief Complaint  Patient presents with  . Abdominal Pain    (Consider location/radiation/quality/duration/timing/severity/associated sxs/prior treatment) HPI Comments: 36 year old female with a history of bipolar disorder and schizophrenia as well as hypertension and pancreatitis. She presents to the emergency department approximately 15 hours after her last visit. She was seen at urgent care and transferred to the emergency department yesterday because of abdominal pain. Currently her pain is in the left upper quadrant, the pain is persistent, aching and 8/10 in severity. It is associated with nausea and vomiting and she has been unable to hold any food or liquids down. This pain has been going on greater than 24 hours. During her prior emergency department visit she was treated with pain medications, nausea medicines and IV fluids and found to have an elevated lipase at that time. That was the only significant laboratory abnormality. She improved during her emergency department stay and was discharged with return instructions if symptoms got worse.  She states that overnight the symptoms have gotten worse and she has been unable to tolerate even water. Symptoms are severe at this time.  The history is provided by the patient and medical records.    Past Medical History  Diagnosis Date  . Hypertension   . Pancreatitis   . Bipolar disorder   . Schizoaffective disorder     Past Surgical History  Procedure Date  . Tubal ligation   . Appendectomy     History reviewed. No pertinent family history.  History  Substance Use Topics  . Smoking status: Passive Smoker  . Smokeless tobacco: Not on file  . Alcohol Use: No     NO ETOH FOR 6 MONTHS    OB History    Grav Para Term Preterm Abortions TAB SAB Ect Mult Living                  Review of Systems    Constitutional: Negative for fever and chills.  HENT: Negative for sore throat and neck pain.   Eyes: Negative for visual disturbance.  Respiratory: Negative for cough and shortness of breath.   Cardiovascular: Negative for chest pain.  Gastrointestinal: Positive for nausea, vomiting, abdominal pain and diarrhea.  Genitourinary: Negative for dysuria and frequency.  Musculoskeletal: Negative for back pain.  Skin: Negative for rash.  Neurological: Negative for weakness, numbness and headaches.  Hematological: Negative for adenopathy.  Psychiatric/Behavioral: Negative for behavioral problems.    Allergies  Norvasc and Zithromax  Home Medications   Current Outpatient Rx  Name Route Sig Dispense Refill  . DIVALPROEX SODIUM ER 500 MG PO TB24 Oral Take 1,500 mg by mouth at bedtime.    Marland Kitchen BENZTROPINE MESYLATE 1 MG PO TABS Oral Take 0.5 mg by mouth 2 (two) times daily.    Marland Kitchen FLUPHENAZINE DECANOATE 25 MG/ML IJ SOLN Intramuscular Inject 12.5 mg into the muscle every 14 (fourteen) days. 0.5 cc    . HYDROXYZINE HCL 25 MG PO TABS Oral Take 50-75 mg by mouth every evening. For insomnia    . LURASIDONE HCL 80 MG PO TABS Oral Take 80 mg by mouth every evening.     . OXYCODONE-ACETAMINOPHEN 5-325 MG PO TABS Oral Take 1 tablet by mouth every 6 (six) hours as needed for pain. 15 tablet 0  . PROMETHAZINE HCL 25 MG PO TABS Oral Take 1  tablet (25 mg total) by mouth every 8 (eight) hours as needed for nausea. 15 tablet 0    BP 145/84  Pulse 86  Temp(Src) 97.4 F (36.3 C) (Oral)  Resp 22  SpO2 98%  LMP 12/04/2010  Physical Exam  Nursing note and vitals reviewed. Constitutional: She appears well-developed and well-nourished.       Uncomfortable appearing  HENT:  Head: Normocephalic and atraumatic.  Mouth/Throat: No oropharyngeal exudate.       Mucous membranes dry  Eyes: Conjunctivae and EOM are normal. Pupils are equal, round, and reactive to light. Right eye exhibits no discharge. Left eye  exhibits no discharge. No scleral icterus.  Neck: Normal range of motion. Neck supple. No JVD present. No thyromegaly present.  Cardiovascular: Normal rate, regular rhythm, normal heart sounds and intact distal pulses.  Exam reveals no gallop and no friction rub.   No murmur heard. Pulmonary/Chest: Effort normal and breath sounds normal. No respiratory distress. She has no wheezes. She has no rales.  Abdominal: Soft. Bowel sounds are normal. She exhibits no distension (tenderness to palpation in the left upper quadrant, no guarding, no masses, no rebound tenderness.) and no mass. There is tenderness.       No tenderness to the right lower quadrant, left lower quadrant or right upper quadrant. No epigastric tenderness. Non-peritoneal  Musculoskeletal: Normal range of motion. She exhibits no edema and no tenderness.  Lymphadenopathy:    She has no cervical adenopathy.  Neurological: She is alert. Coordination normal.  Skin: Skin is warm and dry. No rash noted. No erythema.  Psychiatric: She has a normal mood and affect. Her behavior is normal.    ED Course  Procedures (including critical care time)  Labs Reviewed  URINALYSIS, ROUTINE W REFLEX MICROSCOPIC - Abnormal; Notable for the following:    APPearance CLOUDY (*)    Specific Gravity, Urine 1.044 (*)    Ketones, ur 15 (*)    Protein, ur 30 (*)    All other components within normal limits  CBC - Abnormal; Notable for the following:    WBC 11.5 (*)    All other components within normal limits  COMPREHENSIVE METABOLIC PANEL - Abnormal; Notable for the following:    Sodium 134 (*)    Glucose, Bld 156 (*)    GFR calc non Af Amer 84 (*)    All other components within normal limits  URINE MICROSCOPIC-ADD ON - Abnormal; Notable for the following:    Squamous Epithelial / LPF MANY (*)    Bacteria, UA FEW (*)    All other components within normal limits  POCT PREGNANCY, URINE  POCT PREGNANCY, URINE   Ct Abdomen Pelvis W  Contrast  02/03/2011  *RADIOLOGY REPORT*  Clinical Data: Nausea and vomiting.  CT ABDOMEN AND PELVIS WITH CONTRAST  Technique:  Multidetector CT imaging of the abdomen and pelvis was performed following the standard protocol during bolus administration of intravenous contrast.  Contrast: OMNIPAQUE IOHEXOL 300 MG/ML IV SOLN  Comparison: CT 09/02/2009  Findings: There is mild atelectasis and ground-glass opacities at the lung bases.  No pleural fluid.  No pericardial fluid.  No focal hepatic lesion.  The gallbladder, pancreas, spleen, adrenal glands, and kidneys appear normal.  There is small hiatal hernia.  The stomach, small bowel, and cecum are normal.  The appendix is not identified.  The colon and rectosigmoid colon are normal.  Abdominal aorta is normal caliber.  No retroperitoneal or periportal lymphadenopathy.  No free fluid in  the pelvis.  No pelvic lymphadenopathy.  The bladder, uterus, and ovaries are normal.  No aggressive osseous lesions.  No evidence of significant abdominal hernia.  IMPRESSION:  1.  No acute abdominal or pelvic findings. 2.  Mild atelectasis at the lung bases. 3.  Small hiatal hernia.  Original Report Authenticated By: Genevive Bi, M.D.     1. Pancreatitis       MDM  Exam consistent with pancreatitis. Patient has overall normal vital signs but has significant symptoms requiring IV fluid bolus, maintenance saline, intravenous hydromorphone as well as intravenous antiemetics. Laboratory studies ordered for admission including CBC and basic metabolic panel  Review of medical records patient has elevated lipase 250 yesterday.Marland Kitchen  other laboratory values were normal at that time. CT scan of the abdomen and pelvis performed in the last 12 hours was negative for any acute findings.  We'll proceed with symptomatically her and likely admission due to patient's significant pain and non-tolerating outpatient therapy    CBC shows slight increase of WBC, UA shows that she  has dehydration with high SG and ketones.  Fluids and meds with good improvement.  D/w Dr. Lovell Sheehan - agrees to inpt admission.       Vida Roller, MD 02/04/11 [phone]

## 2011-02-04 NOTE — Progress Notes (Signed)
Subjective: Pt still having abdominal pain 5/10. Nausea improved. Objective: Filed Vitals:   02/04/11 0530 02/04/11 0600 02/04/11 0630 02/04/11 0712  BP: 131/84 140/90 132/65 102/63  Pulse: 91 90 97 96  Temp:    98.2 F (36.8 C)  TempSrc:      Resp:   16 19  Height:    5\' 5"  (1.651 m)  Weight:    119.4 kg (263 lb 3.7 oz)  SpO2: 94% 91% 95% 96%   Weight change:   Intake/Output Summary (Last 24 hours) at 02/04/11 1053 Last data filed at 02/04/11 0543  Gross per 24 hour  Intake   2000 ml  Output      0 ml  Net   2000 ml    General: Obese. Alert, awake, oriented x3, in no acute distress.  HEENT: [place]/AT PEERL, EOMI Neck: Trachea midline,  no masses, no thyromegal,y no JVD, no carotid bruit OROPHARYNX:  Moist, No exudate/ erythema/lesions.  Heart: Regular rate and rhythm, without murmurs, rubs, gallops, PMI non-displaced, no heaves or thrills on palpation.  Lungs: Clear to auscultation, no wheezing or rhonchi noted. No increased vocal fremitus resonant to percussion  Abdomen: Soft, mild diffuse tenderness, nondistended, positive bowel sounds, no masses no hepatosplenomegaly noted..  Neuro: No focal neurological deficits noted cranial nerves II through XII grossly intact. DTRs 2+ bilaterally upper and lower extremities. Strength 5/5 in bilateral upper and lower extremities. Musculoskeletal: No warm swelling or erythema around joints, no spinal tenderness noted. Psychiatric: Patient alert and oriented x3, good insight and cognition, good recent to remote recall. Lymph node survey: No cervical axillary or inguinal lymphadenopathy noted.     Lab Results:  [facility] 02/04/11 0405 02/03/11 1225  NA 134* 136  K 3.9 3.8  CL 98 98  CO2 26 26  GLUCOSE 156* 122*  BUN 6 8  CREATININE 0.87 0.89  CALCIUM 9.6 9.7  MG -- --  PHOS -- --    Basename 02/04/11 0405 02/03/11 1225  AST 17 19  ALT 16 20  ALKPHOS 85 92  BILITOT 0.3 0.2*  PROT 8.0 8.4*  ALBUMIN 3.7 3.9    Basename  02/03/11 1225  LIPASE 269*  AMYLASE --    Basename 02/04/11 0405 02/03/11 1225  WBC 11.5* 10.4  NEUTROABS -- 6.2  HGB 13.3 13.9  HCT 38.0 40.3  MCV 79.3 80.1  PLT 319 318   No results found for this basename: CKTOTAL:3,CKMB:3,CKMBINDEX:3,TROPONINI:3 in the last 72 hours No components found with this basename: POCBNP:3 No results found for this basename: DDIMER:2 in the last 72 hours No results found for this basename: HGBA1C:2 in the last 72 hours No results found for this basename: CHOL:2,HDL:2,LDLCALC:2,TRIG:2,CHOLHDL:2,LDLDIRECT:2 in the last 72 hours No results found for this basename: TSH,T4TOTAL,FREET3,T3FREE,THYROIDAB in the last 72 hours No results found for this basename: VITAMINB12:2,FOLATE:2,FERRITIN:2,TIBC:2,IRON:2,RETICCTPCT:2 in the last 72 hours  Micro Results: No results found for this or any previous visit (from the past 240 hour(s)).  Studies/Results: Ct Abdomen Pelvis W Contrast  02/03/2011  *RADIOLOGY REPORT*  Clinical Data: Nausea and vomiting.  CT ABDOMEN AND PELVIS WITH CONTRAST  Technique:  Multidetector CT imaging of the abdomen and pelvis was performed following the standard protocol during bolus administration of intravenous contrast.  Contrast: OMNIPAQUE IOHEXOL 300 MG/ML IV SOLN  Comparison: CT 09/02/2009  Findings: There is mild atelectasis and ground-glass opacities at the lung bases.  No pleural fluid.  No pericardial fluid.  No focal hepatic lesion.  The gallbladder, pancreas, spleen, adrenal  glands, and kidneys appear normal.  There is small hiatal hernia.  The stomach, small bowel, and cecum are normal.  The appendix is not identified.  The colon and rectosigmoid colon are normal.  Abdominal aorta is normal caliber.  No retroperitoneal or periportal lymphadenopathy.  No free fluid in the pelvis.  No pelvic lymphadenopathy.  The bladder, uterus, and ovaries are normal.  No aggressive osseous lesions.  No evidence of significant abdominal hernia.   IMPRESSION:  1.  No acute abdominal or pelvic findings. 2.  Mild atelectasis at the lung bases. 3.  Small hiatal hernia.  Original Report Authenticated By: Genevive Bi, M.D.    Medications: I have reviewed the patient's current medications. Scheduled Meds:   . sodium chloride   Intravenous STAT  . enoxaparin  40 mg Subcutaneous Q24H  .  HYDROmorphone (DILAUDID) injection  1 mg Intravenous Once  .  HYDROmorphone (DILAUDID) injection  1 mg Intravenous Once  . influenza  inactive virus vaccine  0.5 mL Intramuscular Tomorrow-1000  . promethazine  12.5 mg Intravenous To Major  . promethazine  25 mg Intravenous Once  . sodium chloride  1,000 mL Intravenous Once   Continuous Infusions:   . sodium chloride     PRN Meds:.acetaminophen, acetaminophen, alum & mag hydroxide-simeth, HYDROmorphone, ondansetron (ZOFRAN) IV, ondansetron, oxyCODONE, promethazine, zolpidem Assessment/Plan: Patient Active Hospital Problem List: Pancreatitis (02/04/2011)   Assessment: Pt still with considerable pain.    Plan: keep NPO for now  Abdominal pain (02/04/2011)   Assessment: Pt still with significant abdominal pain.    Plan: continue pain management  Schizophrenia (02/04/2011)   Assessment: Mood appears stable.   Plan: Continue pre-hospital medications  HTN (hypertension) (02/04/2011)   Assessment: BP well controlled    LOS: 0 days

## 2011-02-04 NOTE — ED Notes (Signed)
The pt has had abd pain with vomiting for 5 days.  She was seen here earlier today for the same.  Her meds will not stay down

## 2011-02-04 NOTE — ED Notes (Signed)
Dr Lovell Sheehan in at bedside.  New orders for pain level of 7/10 per Dr Lovell Sheehan.

## 2011-02-04 NOTE — ED Notes (Signed)
Attempted to call report.  RN unable to come to phone.  Will call back to ED in 5 minutes.

## 2011-02-04 NOTE — H&P (Addendum)
DATE OF ADMISSION:  02/04/2011  PCP:   No primary provider on file.   Chief Complaint: ABD Pain, Nausea and Vomiting  HPI: Holly Mendez is an 36 y.o. female complaints of increased nausea and vomiting and ABD Pain X 5 days. She complains of Epigastric and LUQ ABD pain which was sharp and constant and rated the pain as 8/10. She denies hematemesis, hematochezia, and melena.  She reports having yellow stools.  She was seen in the ED 1 day ago and diagnosed  Acute pancreatitis and her lipase was found to be 250.  She reports having pancreatitis in August 2012.    Past Medical History  Diagnosis Date  . Hypertension   . Pancreatitis   . Bipolar disorder   . Schizoaffective disorder     Past Surgical History  Procedure Date  . Tubal ligation   . Appendectomy     Medications:  HOME MEDS: Prior to Admission medications   Medication Sig Start Date End Date Taking? Authorizing Provider  divalproex (DEPAKOTE ER) 500 MG 24 hr tablet Take 1,500 mg by mouth at bedtime.   Yes Historical Provider, MD  benztropine (COGENTIN) 1 MG tablet Take 0.5 mg by mouth 2 (two) times daily.    Historical Provider, MD  fluPHENAZine decanoate (PROLIXIN) 25 MG/ML injection Inject 12.5 mg into the muscle every 14 (fourteen) days. 0.5 cc    Historical Provider, MD  hydrOXYzine (ATARAX/VISTARIL) 25 MG tablet Take 50-75 mg by mouth every evening. For insomnia    Historical Provider, MD  lurasidone (LATUDA) 80 MG TABS Take 80 mg by mouth every evening.     Historical Provider, MD  oxyCODONE-acetaminophen (PERCOCET) 5-325 MG per tablet Take 1 tablet by mouth every 6 (six) hours as needed for pain. 02/03/11 02/13/11  Jamesetta Orleans Lawyer, PA-C  promethazine (PHENERGAN) 25 MG tablet Take 1 tablet (25 mg total) by mouth every 8 (eight) hours as needed for nausea. 02/03/11 02/10/11  Carlyle Dolly, PA-C    Allergies:  Allergies  Allergen Reactions  . Norvasc (Amlodipine Besylate) Other (See Comments)   Mouth irritation  . Zithromax (Azithromycin) Other (See Comments)    Severe stomach cramping    Social History:   reports that she has been passively smoking.  She does not have any smokeless tobacco history on file. She reports that she does not drink alcohol or use illicit drugs.  Family History: History reviewed. No pertinent family history.  Review of Systems:  The patient denies anorexia, fever, weight loss,, vision loss, decreased hearing, hoarseness, chest pain, syncope, dyspnea on exertion, peripheral edema, balance deficits, hemoptysis, abdominal pain, melena, hematochezia, severe indigestion/heartburn, hematuria, incontinence, genital sores, muscle weakness, suspicious skin lesions, transient blindness, difficulty walking, depression, unusual weight change, abnormal bleeding, enlarged lymph nodes, angioedema, and breast masses.   Physical Exam:  GEN:  Pleasant  36 year old Obese African American female examined and in no acute distress; cooperative with exam Filed Vitals:   02/04/11 0408 02/04/11 0418 02/04/11 0500 02/04/11 0530  BP: 135/80 145/84 140/84 131/84  Pulse: 86  94 91  Temp: 97.5 F (36.4 C) 97.4 F (36.3 C)    TempSrc: Oral Oral    Resp: 18 22    SpO2: 95% 98% 93% 94%   Blood pressure 131/84, pulse 91, temperature 97.4 F (36.3 C), temperature source Oral, resp. rate 22, last menstrual period 12/04/2010, SpO2 94.00%. PSYCH: She is alert and oriented x4; does not appear anxious does not appear depressed; affect is normal  HEENT: Normocephalic and Atraumatic, Mucous membranes pink; PERRLA; EOM intact; Fundi:  Benign;  No scleral icterus, Nares: Patent, Oropharynx: Clear, Fair Dentition, Neck:  FROM, no cervical lymphadenopathy nor thyromegaly or carotid bruit; no JVD; Breasts:: Not examined CHEST WALL: No tenderness CHEST: Normal respiration, clear to auscultation bilaterally HEART: Regular rate and rhythm; no murmurs rubs or gallops BACK: No kyphosis or  scoliosis; no CVA tenderness ABDOMEN: Positive Bowel Sounds, Scaphoid, Obese, +Tenderness in Epigastrium and LUQ, NO Rebound No guarding,  no masses, no organomegaly. Rectal Exam: Not done EXTREMITIES: No bone or joint deformity; age-appropriate arthropathy of the hands and knees; no cyanosis, clubbing or edema; no ulcerations. Genitalia: not examined PULSES: 2+ and symmetric SKIN: Normal hydration no rash or ulceration CNS: Cranial nerves 2-12 grossly intact no focal neurologic deficit   Labs & Imaging Results for orders placed during the hospital encounter of 02/04/11 (from the past 48 hour(s))  CBC     Status: Abnormal   Collection Time   02/04/11  4:05 AM      Component Value Range Comment   WBC 11.5 (*) 4.0 - 10.5 (K/uL)    RBC 4.79  3.87 - 5.11 (MIL/uL)    Hemoglobin 13.3  12.0 - 15.0 (g/dL)    HCT 16.1  09.6 - 04.5 (%)    MCV 79.3  78.0 - 100.0 (fL)    MCH 27.8  26.0 - 34.0 (pg)    MCHC 35.0  30.0 - 36.0 (g/dL)    RDW 40.9  81.1 - 91.4 (%)    Platelets 319  150 - 400 (K/uL)   COMPREHENSIVE METABOLIC PANEL     Status: Abnormal   Collection Time   02/04/11  4:05 AM      Component Value Range Comment   Sodium 134 (*) 135 - 145 (mEq/L)    Potassium 3.9  3.5 - 5.1 (mEq/L)    Chloride 98  96 - 112 (mEq/L)    CO2 26  19 - 32 (mEq/L)    Glucose, Bld 156 (*) 70 - 99 (mg/dL)    BUN 6  6 - 23 (mg/dL)    Creatinine, Ser 7.82  0.50 - 1.10 (mg/dL)    Calcium 9.6  8.4 - 10.5 (mg/dL)    Total Protein 8.0  6.0 - 8.3 (g/dL)    Albumin 3.7  3.5 - 5.2 (g/dL)    AST 17  0 - 37 (U/L)    ALT 16  0 - 35 (U/L)    Alkaline Phosphatase 85  39 - 117 (U/L)    Total Bilirubin 0.3  0.3 - 1.2 (mg/dL)    GFR calc non Af Amer 84 (*) >90 (mL/min)    GFR calc Af Amer >90  >90 (mL/min)   URINALYSIS, ROUTINE W REFLEX MICROSCOPIC     Status: Abnormal   Collection Time   02/04/11  4:18 AM      Component Value Range Comment   Color, Urine YELLOW  YELLOW     APPearance CLOUDY (*) CLEAR     Specific  Gravity, Urine 1.044 (*) 1.005 - 1.030     pH 6.0  5.0 - 8.0     Glucose, UA NEGATIVE  NEGATIVE (mg/dL)    Hgb urine dipstick NEGATIVE  NEGATIVE     Bilirubin Urine NEGATIVE  NEGATIVE     Ketones, ur 15 (*) NEGATIVE (mg/dL)    Protein, ur 30 (*) NEGATIVE (mg/dL)    Urobilinogen, UA 0.2  0.0 - 1.0 (mg/dL)  Nitrite NEGATIVE  NEGATIVE     Leukocytes, UA NEGATIVE  NEGATIVE    URINE MICROSCOPIC-ADD ON     Status: Abnormal   Collection Time   02/04/11  4:18 AM      Component Value Range Comment   Squamous Epithelial / LPF MANY (*) RARE     WBC, UA 0-2  <3 (WBC/hpf)    Bacteria, UA FEW (*) RARE    POCT PREGNANCY, URINE     Status: Normal   Collection Time   02/04/11  4:22 AM      Component Value Range Comment   Preg Test, Ur NEGATIVE  NEGATIVE     Ct Abdomen Pelvis W Contrast  02/03/2011  *RADIOLOGY REPORT*  Clinical Data: Nausea and vomiting.  CT ABDOMEN AND PELVIS WITH CONTRAST  Technique:  Multidetector CT imaging of the abdomen and pelvis was performed following the standard protocol during bolus administration of intravenous contrast.  Contrast: OMNIPAQUE IOHEXOL 300 MG/ML IV SOLN  Comparison: CT 09/02/2009  Findings: There is mild atelectasis and ground-glass opacities at the lung bases.  No pleural fluid.  No pericardial fluid.  No focal hepatic lesion.  The gallbladder, pancreas, spleen, adrenal glands, and kidneys appear normal.  There is small hiatal hernia.  The stomach, small bowel, and cecum are normal.  The appendix is not identified.  The colon and rectosigmoid colon are normal.  Abdominal aorta is normal caliber.  No retroperitoneal or periportal lymphadenopathy.  No free fluid in the pelvis.  No pelvic lymphadenopathy.  The bladder, uterus, and ovaries are normal.  No aggressive osseous lesions.  No evidence of significant abdominal hernia.  IMPRESSION:  1.  No acute abdominal or pelvic findings. 2.  Mild atelectasis at the lung bases. 3.  Small hiatal hernia.  Original Report  Authenticated By: Genevive Bi, M.D.      Assessment:  1.  Acute Pancreatitis 2.  ABD Pain 3.  Nausea and Vomiting 4.  Hx of Bipolar and Schizophrenia 5.  Morbid Obesity  Plan:  Pain Control Antiemetics PRN IVFs Clear Liqiuids for now May need Gi eval for possible ERCP DVT prophylaxis. Other plans as per orders.    CODE STATUS:      FULL CODE        Nylen Creque C 02/04/2011, 5:56 AM

## 2011-02-04 NOTE — ED Notes (Signed)
Patient with left sided abdominal pain with nausea, vomiting and diarrhea.  Patient states the last time she had vomiting and diarrhea was before coming to ED.  Patient with tenderness in left upper quadrant.

## 2011-02-05 LAB — BASIC METABOLIC PANEL
BUN: 4 mg/dL — ABNORMAL LOW (ref 6–23)
CO2: 28 mEq/L (ref 19–32)
Chloride: 106 mEq/L (ref 96–112)
GFR calc Af Amer: 90 mL/min — ABNORMAL LOW (ref >90)
Glucose, Bld: 80 mg/dL (ref 70–99)
Potassium: 3.4 mEq/L — ABNORMAL LOW (ref 3.5–5.1)

## 2011-02-05 LAB — CBC
HCT: 35.9 % — ABNORMAL LOW (ref 36.0–46.0)
Hemoglobin: 11.7 g/dL — ABNORMAL LOW (ref 12.0–15.0)
MCHC: 32.6 g/dL (ref 30.0–36.0)
MCV: 82 fL (ref 78.0–100.0)

## 2011-02-05 MED ORDER — POLYETHYLENE GLYCOL 3350 17 G PO PACK
17.0000 g | PACK | Freq: Every day | ORAL | Status: DC
  Administered 2011-02-05: 17 g via ORAL
  Filled 2011-02-05 (×4): qty 1

## 2011-02-05 NOTE — Progress Notes (Signed)
Subjective: Pt states pain markedly improved and now only 1-2/10. Patient has tolerated the full meal for the first time just right now. States that she feels constipated and has had no bowel movement since admission. Objective: Filed Vitals:   02/04/11 2233 02/05/11 0530 02/05/11 0945 02/05/11 1354  BP: 112/72 113/75 106/71 112/73  Pulse: 79 75 57 82  Temp: 97.4 F (36.3 C) 95 F (35 C) 97.5 F (36.4 C) 98.3 F (36.8 C)  TempSrc:  Oral Oral Oral  Resp: 18 20 20 20   Height:      Weight:      SpO2: 94% 94% 100% 97%   Weight change:   Intake/Output Summary (Last 24 hours) at 02/05/11 1420 Last data filed at 02/05/11 1355  Gross per 24 hour  Intake   2472 ml  Output   1002 ml  Net   1470 ml    General: Obese. Alert, awake, oriented x3, in no acute distress.  HEENT: [place]/AT [place]ER[place], EOMI Neck: Trachea midline,  no masses, no thyromegal,y no JVD, no carotid bruit OROPHARYNX:  Moist, No exudate/ erythema/lesions.  Heart: Regular rate and rhythm, without murmurs, rubs, gallops, PMI non-displaced, no heaves or thrills on palpation.  Lungs: Clear to auscultation, no wheezing or rhonchi noted. No increased vocal fremitus resonant to percussion  Abdomen: Soft, very mild epigastric tenderness, nondistended, positive bowel sounds, no masses no hepatosplenomegaly noted..  Neuro: No focal neurological deficits noted cranial nerves II through XII grossly intact. DTRs 2+ bilaterally upper and lower extremities. Strength 5/5 in bilateral upper and lower extremities. Musculoskeletal: No warm swelling or erythema around joints, no spinal tenderness noted. Psychiatric: Patient alert and oriented x3, good insight and cognition, good recent to remote recall. Lymph node survey: No cervical axillary or inguinal lymphadenopathy noted.     Lab Results:  [facility] 02/05/11 0555 02/04/11 0405  NA 139 134*  K 3.4* 3.9  CL 106 98  CO2 28 26  GLUCOSE 80 156*  BUN 4* 6  CREATININE 0.93 0.87  CALCIUM  8.4 9.6  MG -- --  PHOS -- --    Basename 02/04/11 0405 02/03/11 1225  AST 17 19  ALT 16 20  ALKPHOS 85 92  BILITOT 0.3 0.2*  PROT 8.0 8.4*  ALBUMIN 3.7 3.9    Basename 02/03/11 1225  LIPASE 269*  AMYLASE --    Basename 02/05/11 0555 02/04/11 0405 02/03/11 1225  WBC 9.0 11.5* --  NEUTROABS -- -- 6.2  HGB 11.7* 13.3 --  HCT 35.9* 38.0 --  MCV 82.0 79.3 --  PLT 238 319 --   No results found for this basename: CKTOTAL:3,CKMB:3,CKMBINDEX:3,TROPONINI:3 in the last 72 hours No components found with this basename: POCBNP:3 No results found for this basename: DDIMER:2 in the last 72 hours No results found for this basename: HGBA1C:2 in the last 72 hours No results found for this basename: CHOL:2,HDL:2,LDLCALC:2,TRIG:2,CHOLHDL:2,LDLDIRECT:2 in the last 72 hours No results found for this basename: TSH,T4TOTAL,FREET3,T3FREE,THYROIDAB in the last 72 hours No results found for this basename: VITAMINB12:2,FOLATE:2,FERRITIN:2,TIBC:2,IRON:2,RETICCTPCT:2 in the last 72 hours  Micro Results: No results found for this or any previous visit (from the past 240 hour(s)).  Studies/Results: Ct Abdomen Pelvis W Contrast  02/03/2011  *RADIOLOGY REPORT*  Clinical Data: Nausea and vomiting.  CT ABDOMEN AND PELVIS WITH CONTRAST  Technique:  Multidetector CT imaging of the abdomen and pelvis was performed following the standard protocol during bolus administration of intravenous contrast.  Contrast: OMNIPAQUE IOHEXOL 300 MG/ML IV SOLN  Comparison:  CT 09/02/2009  Findings: There is mild atelectasis and ground-glass opacities at the lung bases.  No pleural fluid.  No pericardial fluid.  No focal hepatic lesion.  The gallbladder, pancreas, spleen, adrenal glands, and kidneys appear normal.  There is small hiatal hernia.  The stomach, small bowel, and cecum are normal.  The appendix is not identified.  The colon and rectosigmoid colon are normal.  Abdominal aorta is normal caliber.  No retroperitoneal or  periportal lymphadenopathy.  No free fluid in the pelvis.  No pelvic lymphadenopathy.  The bladder, uterus, and ovaries are normal.  No aggressive osseous lesions.  No evidence of significant abdominal hernia.  IMPRESSION:  1.  No acute abdominal or pelvic findings. 2.  Mild atelectasis at the lung bases. 3.  Small hiatal hernia.  Original Report Authenticated By: Genevive Bi, M.D.    Medications: I have reviewed the patient's current medications. Scheduled Meds:    . sodium chloride   Intravenous STAT  . benztropine  0.5 mg Oral BID  . divalproex  1,500 mg Oral QHS  . enoxaparin  40 mg Subcutaneous Q24H  . hydrOXYzine  50-75 mg Oral QPM  . influenza  inactive virus vaccine  0.5 mL Intramuscular Tomorrow-1000  . lurasidone  80 mg Oral QPM   Continuous Infusions:    . sodium chloride 125 mL/hr at 02/05/11 0521   PRN Meds:.acetaminophen, acetaminophen, alum & mag hydroxide-simeth, HYDROmorphone, ondansetron (ZOFRAN) IV, ondansetron, oxyCODONE, promethazine, zolpidem Assessment/Plan: Patient Active Hospital Problem List: Pancreatitis (02/04/2011)   Assessment: Patient almost without any pain. Tolerating diet well so far.    Plan: Continue on a heart healthy diet. It does well be discharged tomorrow  Abdominal pain (02/04/2011)   Assessment: Markedly improved.    Plan: Discontinue IV pain medications and use oral only as needed  Schizophrenia (02/04/2011)   Assessment: Mood  stable.   Plan: Continue pre-hospital medications  HTN (hypertension) (02/04/2011)   Assessment: BP well controlled  Disposition: Likely discharge home tomorrow   LOS: 1 day

## 2011-02-05 NOTE — Plan of Care (Signed)
Pt arrived from ED, Full Code, a/o, denied pain or nausea at this time, abd slightly tender to touch, skin intact, IV NS infusing lt a/c area, ambulates without assistance, describes having n/v for few days at home, lungs clear, voiding without difficulty, psy hx (see health hx notes), describes ETOH use several months ago, lives at home with husband & children. Plan to hydrate with IVF, monitor VS, lab values, control pain & nausea, allow clear liq intake & progression as ordered & tolerated, will return home with family at discharge. Bonney Leitz RN

## 2011-02-06 ENCOUNTER — Inpatient Hospital Stay (HOSPITAL_COMMUNITY): Payer: Medicaid Other

## 2011-02-06 LAB — COMPREHENSIVE METABOLIC PANEL
ALT: 27 U/L (ref 0–35)
Alkaline Phosphatase: 79 U/L (ref 39–117)
BUN: 4 mg/dL — ABNORMAL LOW (ref 6–23)
CO2: 31 mEq/L (ref 19–32)
Chloride: 102 mEq/L (ref 96–112)
GFR calc Af Amer: 84 mL/min — ABNORMAL LOW (ref >90)
GFR calc non Af Amer: 73 mL/min — ABNORMAL LOW (ref >90)
Glucose, Bld: 99 mg/dL (ref 70–99)
Potassium: 3.6 mEq/L (ref 3.5–5.1)
Total Bilirubin: 0.2 mg/dL — ABNORMAL LOW (ref 0.3–1.2)

## 2011-02-06 MED ORDER — MORPHINE SULFATE 4 MG/ML IJ SOLN
4.0000 mg | INTRAMUSCULAR | Status: DC | PRN
  Administered 2011-02-06 – 2011-02-08 (×9): 4 mg via INTRAVENOUS
  Filled 2011-02-06 (×9): qty 1

## 2011-02-06 MED ORDER — POTASSIUM CHLORIDE CRYS ER 20 MEQ PO TBCR: 40.0000 meq | EXTENDED_RELEASE_TABLET | ORAL | Status: DC

## 2011-02-06 MED ORDER — IOHEXOL 300 MG/ML  SOLN
100.0000 mL | Freq: Once | INTRAMUSCULAR | Status: AC | PRN
  Administered 2011-02-06: 100 mL via INTRAVENOUS

## 2011-02-06 MED ORDER — POTASSIUM CHLORIDE 10 MEQ/100ML IV SOLN
10.0000 meq | INTRAVENOUS | Status: AC
  Administered 2011-02-06 (×3): 10 meq via INTRAVENOUS
  Filled 2011-02-06 (×3): qty 100

## 2011-02-06 NOTE — Progress Notes (Signed)
Subjective: Pt states pain from yesterday markedly improved but she now has a new pain in the LUQ. Pt also started having Nausea and emesis again thi morning after having tolerated food yesterday. Pt also c/o chills which started today. Objective: Filed Vitals:   02/05/11 2141 02/06/11 0151 02/06/11 0521 02/06/11 1029  BP: 108/69 116/75 116/75 117/73  Pulse: 77 80 82 75  Temp: 97.5 F (36.4 C) 98 F (36.7 C) 98.1 F (36.7 C) 98.1 F (36.7 C)  TempSrc:    Oral  Resp: 20 20 20 20   Height:      Weight:      SpO2: 94% 96% 94% 97%   Weight change:   Intake/Output Summary (Last 24 hours) at 02/06/11 1119 Last data filed at 02/06/11 1016  Gross per 24 hour  Intake   2202 ml  Output    550 ml  Net   1652 ml    General: Obese. Alert, awake, oriented x3, in no acute distress.  HEENT: /AT [place], EOMI Neck: Trachea midline,  no masses, no thyromegal,y no JVD, no carotid bruit OROPHARYNX:  Moist, No exudate/ erythema/lesions.  Heart: Regular rate and rhythm, without murmurs, rubs, gallops, PMI non-displaced, no heaves or thrills on palpation.  Lungs: Clear to auscultation, no wheezing or rhonchi noted. No increased vocal fremitus resonant to percussion  Abdomen: Soft, LUQ tenderness, nondistended, positive bowel sounds, no masses no hepatosplenomegaly noted.  Neuro: No focal neurological deficits noted cranial nerves II through XII grossly intact. DTRs 2+ bilaterally upper and lower extremities. Strength 5/5 in bilateral upper and lower extremities. Musculoskeletal: No warm swelling or erythema around joints, no spinal tenderness noted. Psychiatric: Patient alert and oriented x3, good insight and cognition, good recent to remote recall. Lymph node survey: No cervical axillary or inguinal lymphadenopathy noted.     Lab Results:  [facility] 02/05/11 0555 02/04/11 0405  NA 139 134*  K 3.4* 3.9  CL 106 98  CO2 28 26  GLUCOSE 80 156*  BUN 4* 6  CREATININE 0.93 0.87  CALCIUM 8.4 9.6   MG -- --  PHOS -- --    Basename 02/04/11 0405 02/03/11 1225  AST 17 19  ALT 16 20  ALKPHOS 85 92  BILITOT 0.3 0.2*  PROT 8.0 8.4*  ALBUMIN 3.7 3.9    Basename 02/03/11 1225  LIPASE 269*  AMYLASE --    Basename 02/05/11 0555 02/04/11 0405 02/03/11 1225  WBC 9.0 11.5* --  NEUTROABS -- -- 6.2  HGB 11.7* 13.3 --  HCT 35.9* 38.0 --  MCV 82.0 79.3 --  PLT 238 319 --   No results found for this basename: CKTOTAL:3,CKMB:3,CKMBINDEX:3,TROPONINI:3 in the last 72 hours No components found with this basename: POCBNP:3 No results found for this basename: DDIMER:2 in the last 72 hours No results found for this basename: HGBA1C:2 in the last 72 hours No results found for this basename: CHOL:2,HDL:2,LDLCALC:2,TRIG:2,CHOLHDL:2,LDLDIRECT:2 in the last 72 hours No results found for this basename: TSH,T4TOTAL,FREET3,T3FREE,THYROIDAB in the last 72 hours No results found for this basename: VITAMINB12:2,FOLATE:2,FERRITIN:2,TIBC:2,IRON:2,RETICCTPCT:2 in the last 72 hours  Micro Results: No results found for this or any previous visit (from the past 240 hour(s)).  Studies/Results: Ct Abdomen Pelvis W Contrast  02/03/2011  *RADIOLOGY REPORT*  Clinical Data: Nausea and vomiting.  CT ABDOMEN AND PELVIS WITH CONTRAST  Technique:  Multidetector CT imaging of the abdomen and pelvis was performed following the standard protocol during bolus administration of intravenous contrast.  Contrast: OMNIPAQUE IOHEXOL 300 MG/ML IV  SOLN  Comparison: CT 09/02/2009  Findings: There is mild atelectasis and ground-glass opacities at the lung bases.  No pleural fluid.  No pericardial fluid.  No focal hepatic lesion.  The gallbladder, pancreas, spleen, adrenal glands, and kidneys appear normal.  There is small hiatal hernia.  The stomach, small bowel, and cecum are normal.  The appendix is not identified.  The colon and rectosigmoid colon are normal.  Abdominal aorta is normal caliber.  No retroperitoneal or  periportal lymphadenopathy.  No free fluid in the pelvis.  No pelvic lymphadenopathy.  The bladder, uterus, and ovaries are normal.  No aggressive osseous lesions.  No evidence of significant abdominal hernia.  IMPRESSION:  1.  No acute abdominal or pelvic findings. 2.  Mild atelectasis at the lung bases. 3.  Small hiatal hernia.  Original Report Authenticated By: Genevive Bi, M.D.    Medications: I have reviewed the patient's current medications. Scheduled Meds:    . benztropine  0.5 mg Oral BID  . divalproex  1,500 mg Oral QHS  . enoxaparin  40 mg Subcutaneous Q24H  . hydrOXYzine  50-75 mg Oral QPM  . lurasidone  80 mg Oral QPM  . polyethylene glycol  17 g Oral Daily  . potassium chloride  10 mEq Intravenous Q1 Hr x 3  . DISCONTD: potassium chloride  40 mEq Oral Q2H   Continuous Infusions:    . DISCONTD: sodium chloride 125 mL/hr at 02/05/11 0521   PRN Meds:.acetaminophen, acetaminophen, alum & mag hydroxide-simeth, ondansetron (ZOFRAN) IV, ondansetron, oxyCODONE, promethazine, zolpidem, DISCONTD: HYDROmorphone Assessment/Plan: Patient Active Hospital Problem List: Pancreatitis (02/04/2011)   Assessment: Pt now with recurrent emesis. Etiology is unclear. I will get an abdominal X-ray to ensure no gross abnormalities. Will also get CT scan of abdomen with contrast.   Plan:Resume NPO status  Abdominal pain (02/04/2011)   Assessment:Pt with new LUQ pain.    Plan: Resume IV pain medications and obtain abdominal studies, CMET, Lipase.  Schizophrenia (02/04/2011)   Assessment: Mood  stable.   Plan: Continue pre-hospital medications  HTN (hypertension) (02/04/2011)   Assessment: BP well controlled  Disposition:DS/C on hold for now.   LOS: 2 days

## 2011-02-06 NOTE — ED Provider Notes (Signed)
Medical screening examination/treatment/procedure(s) were performed by non-physician practitioner and as supervising physician I was immediately available for consultation/collaboration.   Tameca Jerez DOUGLAS MD.    Tonna Palazzi Douglas Delitha Elms, MD 02/06/11 1346 

## 2011-02-06 NOTE — Progress Notes (Signed)
   CARE MANAGEMENT NOTE 02/06/2011  Patient:  Holly Mendez, Holly Mendez   Account Number:  [id number]  Date Initiated:  02/06/2011  Documentation initiated by:  Letha Cape  Subjective/Objective Assessment:   dx pancreatitis  admit- lives with spouse.     Action/Plan:   Anticipated DC Date:  02/06/2011   Anticipated DC Plan:  HOME/SELF CARE      DC Planning Services  CM consult  Medication Assistance  [facility]      Choice offered to / List presented to:             Status of service:  In process, will continue to follow Medicare Important Message given?   (If response is "NO", the following Medicare IM given date fields will be blank) Date Medicare IM given:   Date Additional Medicare IM given:    Discharge Disposition:    Per UR Regulation:    Comments:  [facility] eligibility appt,  2/13 at [facility] f/u appt  3/15  at 3:15 Dr. Andrey Campanile  02/06/11 11:19 Letha Cape RN, BSN [phone] Patient lives with spouse, will need help with medication ast, patient is eligible for zz fund.  Patient has transportation to go home when dc.  Today patient is having some n/v, temp.  NCM will continue to follow.

## 2011-02-07 NOTE — Progress Notes (Signed)
Subjective: Pt states that her nausea has completely resolved.  Asking for food. Objective: Filed Vitals:   02/06/11 2207 02/07/11 0215 02/07/11 0513 02/07/11 1351  BP: 138/84 122/76 134/83 108/72  Pulse: 93 86 84 87  Temp: 98.8 F (37.1 C) 98.3 F (36.8 C) 99 F (37.2 C) 97.4 F (36.3 C)  TempSrc: Oral Oral Oral Oral  Resp: 18 20 20 20   Height:      Weight:      SpO2: 93% 93% 96% 98%   Weight change:   Intake/Output Summary (Last 24 hours) at 02/07/11 1944 Last data filed at 02/07/11 1902  Gross per 24 hour  Intake   1328 ml  Output      0 ml  Net   1328 ml    General: Obese. Alert, awake, oriented x3, in no acute distress.  HEENT: [place]/AT [place]ER[place], EOMI Neck: Trachea midline,  no masses, no thyromegal,y no JVD, no carotid bruit OROPHARYNX:  Moist, No exudate/ erythema/lesions.  Heart: Regular rate and rhythm, without murmurs, rubs, gallops, PMI non-displaced, no heaves or thrills on palpation.  Lungs: Clear to auscultation, no wheezing or rhonchi noted. No increased vocal fremitus resonant to percussion  Abdomen: Soft, LUQ tenderness, nondistended, positive bowel sounds, no masses no hepatosplenomegaly noted.  Neuro: No focal neurological deficits noted cranial nerves II through XII grossly intact. DTRs 2+ bilaterally upper and lower extremities. Strength 5/5 in bilateral upper and lower extremities. Musculoskeletal: No warm swelling or erythema around joints, no spinal tenderness noted. Psychiatric: Patient alert and oriented x3, good insight and cognition, good recent to remote recall. Lymph node survey: No cervical axillary or inguinal lymphadenopathy noted.     Lab Results:  Basename 02/06/11 1200 02/05/11 0555  NA 142 139  K 3.6 3.4*  CL 102 106  CO2 31 28  GLUCOSE 99 80  BUN 4* 4*  CREATININE 0.98 0.93  CALCIUM 9.8 8.4  MG 2.2 --  PHOS -- --    Basename 02/06/11 1200  AST 22  ALT 27  ALKPHOS 79  BILITOT 0.2*  PROT 7.7  ALBUMIN 3.6    Basename  02/06/11 1200  LIPASE 217*  AMYLASE --    Basename 02/05/11 0555  WBC 9.0  NEUTROABS --  HGB 11.7*  HCT 35.9*  MCV 82.0  PLT 238   No results found for this basename: CKTOTAL:3,CKMB:3,CKMBINDEX:3,TROPONINI:3 in the last 72 hours No components found with this basename: POCBNP:3 No results found for this basename: DDIMER:2 in the last 72 hours No results found for this basename: HGBA1C:2 in the last 72 hours No results found for this basename: CHOL:2,HDL:2,LDLCALC:2,TRIG:2,CHOLHDL:2,LDLDIRECT:2 in the last 72 hours No results found for this basename: TSH,T4TOTAL,FREET3,T3FREE,THYROIDAB in the last 72 hours No results found for this basename: VITAMINB12:2,FOLATE:2,FERRITIN:2,TIBC:2,IRON:2,RETICCTPCT:2 in the last 72 hours  Micro Results: No results found for this or any previous visit (from the past 240 hour(s)).  Studies/Results: Ct Abdomen Pelvis W Contrast  02/03/2011  *RADIOLOGY REPORT*  Clinical Data: Nausea and vomiting.  CT ABDOMEN AND PELVIS WITH CONTRAST  Technique:  Multidetector CT imaging of the abdomen and pelvis was performed following the standard protocol during bolus administration of intravenous contrast.  Contrast: OMNIPAQUE IOHEXOL 300 MG/ML IV SOLN  Comparison: CT 09/02/2009  Findings: There is mild atelectasis and ground-glass opacities at the lung bases.  No pleural fluid.  No pericardial fluid.  No focal hepatic lesion.  The gallbladder, pancreas, spleen, adrenal glands, and kidneys appear normal.  There is small hiatal hernia.  The stomach, small bowel, and cecum are normal.  The appendix is not identified.  The colon and rectosigmoid colon are normal.  Abdominal aorta is normal caliber.  No retroperitoneal or periportal lymphadenopathy.  No free fluid in the pelvis.  No pelvic lymphadenopathy.  The bladder, uterus, and ovaries are normal.  No aggressive osseous lesions.  No evidence of significant abdominal hernia.  IMPRESSION:  1.  No acute abdominal or pelvic  findings. 2.  Mild atelectasis at the lung bases. 3.  Small hiatal hernia.  Original Report Authenticated By: Genevive Bi, M.D.    Medications: I have reviewed the patient's current medications. Scheduled Meds:    . benztropine  0.5 mg Oral BID  . divalproex  1,500 mg Oral QHS  . enoxaparin  40 mg Subcutaneous Q24H  . hydrOXYzine  50-75 mg Oral QPM  . lurasidone  80 mg Oral QPM  . polyethylene glycol  17 g Oral Daily   Continuous Infusions:   PRN Meds:.acetaminophen, acetaminophen, alum & mag hydroxide-simeth, morphine injection, ondansetron (ZOFRAN) IV, ondansetron, promethazine, zolpidem Assessment/Plan: Patient Active Hospital Problem List: Pancreatitis (02/04/2011)   Assessment: Resolved. Patient now asking for food.  Plan:Advance diet.  Abdominal pain (02/04/2011)   Assessment:Abdominal pain resolved.   Schizophrenia (02/04/2011)   Assessment: Mood  stable.   Plan: Continue pre-hospital medications  HTN (hypertension) (02/04/2011)   Assessment: BP well controlled  Disposition:DS/C on hold for now.   LOS: 3 days

## 2011-02-08 MED ORDER — OXYCODONE HCL 5 MG PO TABS: 5.0000 mg | ORAL_TABLET | ORAL | Status: DC | PRN

## 2011-02-08 MED ORDER — ONDANSETRON HCL 4 MG PO TABS: 4.0000 mg | ORAL_TABLET | Freq: Four times a day (QID) | ORAL | Status: DC | PRN

## 2011-02-08 NOTE — Progress Notes (Signed)
01/3011 NSG 1445  Patient discharged to home with family, discharged instructions given and reviewed with patient.  Patient verbalized understanding, care notes given for new meds and pertinent education. Skin intact at discharge, IV discharged and intact. Patient escorted to car via wheelchair by NT.

## 2011-02-08 NOTE — Discharge Summary (Signed)
Discharge Summary  [name] MR#: [id number]  DOB:Jan 30, 1975  Date of Admission: 02/04/2011 Date of Discharge: 02/08/2011  Patient's PCP: No primary provider on file.  Attending Physician:THOMPSON,DANIEL  Consults:   None  Discharge Diagnoses: Pancreatitis Present on Admission:  .Pancreatitis .Abdominal pain .Schizophrenia .HTN (hypertension)   Brief Admitting History and Physical Holly Mendez is an 36 y.o. female complaints of increased nausea and vomiting and ABD Pain X 5 days. She complains of Epigastric and LUQ ABD pain which was sharp and constant and rated the pain as 8/10. She denies hematemesis, hematochezia, and melena. She reports having yellow stools. She was seen in the ED 1 day ago and diagnosed Acute pancreatitis and her lipase was found to be 250. She reports having pancreatitis in August 2012.    Discharge Medications Medication List  As of 02/08/2011 12:59 PM   START taking these medications         ondansetron 4 MG tablet   Commonly known as: ZOFRAN   Take 1 tablet (4 mg total) by mouth every 6 (six) hours as needed for nausea.      oxyCODONE 5 MG immediate release tablet   Commonly known as: Oxy IR/ROXICODONE   Take 1 tablet (5 mg total) by mouth every 4 (four) hours as needed.         CONTINUE taking these medications         benztropine 1 MG tablet   Commonly known as: COGENTIN      divalproex 500 MG 24 hr tablet   Commonly known as: DEPAKOTE ER      fluPHENAZine decanoate 25 MG/ML injection   Commonly known as: PROLIXIN      hydrOXYzine 25 MG tablet   Commonly known as: ATARAX/VISTARIL      lurasidone 80 MG Tabs   Commonly known as: LATUDA         STOP taking these medications         oxyCODONE-acetaminophen 5-325 MG per tablet      promethazine 25 MG tablet          Where to get your medications    These are the prescriptions that you need to pick up.   You may get these medications from any  pharmacy.         ondansetron 4 MG tablet   oxyCODONE 5 MG immediate release tablet           Hospital Course: Pancreatitis Patient was admitted to the MedSurg floor for an acute exacerbation of pancreatitis. Patient was placed on clear liquids, antiemetics, pain control. Initially patient still continued to have abdominal pain and a such was placed on bowel rest. CT of the abdomen and pelvis with done which was negative. Patient improved clinically and was subsequently placed on a diet. On 02/06/2011 patient continued to have some nausea as well as emesis on the morning even though she tolerated oral intake the day before. Abdominal x-ray was obtained which was negative as well as CT of the abdomen and pelvis was also obtained which was negative. Patient's nausea and emesis resolved abdomen abdominal pain improved and her diet was resumed and advanced. Patient's lipase levels trended down from admission levels. Patient tolerated diet well without any further nausea or emesis and abdominal pain had improved significantly. Patient however did complain of some abdominal discomfort which she felt was secondary to retching. Patient improved clinically and will be discharged home in stable and improved condition. The rest of patient's chronic  medical issues remained stable throughout the hospitalization.  Present on Admission:  .Pancreatitis .Abdominal pain .Schizophrenia .HTN (hypertension)   Day of Discharge BP 98/64  Pulse 88  Temp(Src) 97.8 F (36.6 C) (Oral)  Resp 20  Ht 5\' 5"  (1.651 m)  Wt 119.4 kg (263 lb 3.7 oz)  BMI 43.80 kg/m2  SpO2 95%  LMP 12/04/2010 General: Alert, awake, oriented x3, in no acute distress. HEENT: No bruits, no goiter. Heart: Regular rate and rhythm, without murmurs, rubs, gallops. Lungs: Clear to auscultation bilaterally. Abdomen: Soft, nontender, nondistended, positive bowel sounds. Extremities: No clubbing cyanosis or edema with positive pedal  pulses.   No results found for this or any previous visit (from the past 48 hour(s)).  Dg Abd 1 View  02/06/2011  *RADIOLOGY REPORT*  Clinical Data: Left lower quadrant pain.  Vomiting.  ABDOMEN - 1 VIEW  Comparison: CT scan dated 02/06/2011 at 24 p.m.  Findings: Contrast is seen in both renal collecting systems with no hydronephrosis.  The entire right ureter is visible.  Bladder appears normal.  Contrast is seen in the nondistended colon. Osseous structures are normal.  IMPRESSION: Benign-appearing abdomen.  Original Report Authenticated By: Gwynn Burly, M.D.   Ct Abdomen W Contrast  02/06/2011  *RADIOLOGY REPORT*  Clinical Data:  Abdominal pain.  CT ABDOMEN WITH CONTRAST  Technique:  Multidetector CT imaging of the abdomen was performed using the standard protocol following bolus administration of intravenous contrast.  Contrast: OMNIPAQUE IOHEXOL 300 MG/ML IV SOLN  Comparison:  02/03/2011.  Findings:  Lung bases show no acute findings.  Heart size normal. No pericardial or pleural effusion.  Small hiatal hernia.  Liver, gallbladder, adrenal glands, kidneys, spleen, pancreas, stomach and visualized bowel are unremarkable.  No pathologically enlarged lymph nodes.  No free fluid.  No worrisome lytic or sclerotic lesions.  IMPRESSION: No acute findings.  No findings to explain the patient's given symptoms.  Original Report Authenticated By: Reyes Ivan, M.D.   Ct Abdomen Pelvis W Contrast  02/03/2011  *RADIOLOGY REPORT*  Clinical Data: Nausea and vomiting.  CT ABDOMEN AND PELVIS WITH CONTRAST  Technique:  Multidetector CT imaging of the abdomen and pelvis was performed following the standard protocol during bolus administration of intravenous contrast.  Contrast: OMNIPAQUE IOHEXOL 300 MG/ML IV SOLN  Comparison: CT 09/02/2009  Findings: There is mild atelectasis and ground-glass opacities at the lung bases.  No pleural fluid.  No pericardial fluid.  No focal hepatic lesion.  The  gallbladder, pancreas, spleen, adrenal glands, and kidneys appear normal.  There is small hiatal hernia.  The stomach, small bowel, and cecum are normal.  The appendix is not identified.  The colon and rectosigmoid colon are normal.  Abdominal aorta is normal caliber.  No retroperitoneal or periportal lymphadenopathy.  No free fluid in the pelvis.  No pelvic lymphadenopathy.  The bladder, uterus, and ovaries are normal.  No aggressive osseous lesions.  No evidence of significant abdominal hernia.  IMPRESSION:  1.  No acute abdominal or pelvic findings. 2.  Mild atelectasis at the lung bases. 3.  Small hiatal hernia.  Original Report Authenticated By: Genevive Bi, M.D.     Disposition: Home   Diet: Gen. diet  Activity: Increase activity slowly   Follow-up Appts: Discharge Orders    Future Orders Please Complete By Expires   Diet general      Increase activity slowly      Discharge instructions      Comments:   [facility] with Dr Andrey Campanile   Other Restrictions      Comments:   No Alcohol      TESTS THAT NEED FOLLOW-UP   Time spent on discharge, talking to the patient, and coordinating care: 50 mins.   SignedRamiro Harvest 02/08/2011, 12:59 PM

## 2011-02-12 ENCOUNTER — Encounter (HOSPITAL_COMMUNITY): Payer: Self-pay | Admitting: Emergency Medicine

## 2011-02-12 ENCOUNTER — Inpatient Hospital Stay (HOSPITAL_COMMUNITY)
Admission: EM | Admit: 2011-02-12 | Discharge: 2011-02-14 | DRG: 440 | Disposition: A | Discharge status: Home / Self Care | Payer: Medicaid Other | Attending: Internal Medicine | Admitting: Internal Medicine

## 2011-02-12 DIAGNOSIS — F209 Schizophrenia, unspecified: Secondary | ICD-10-CM | POA: Diagnosis present

## 2011-02-12 DIAGNOSIS — K861 Other chronic pancreatitis: Secondary | ICD-10-CM | POA: Diagnosis present

## 2011-02-12 DIAGNOSIS — R1084 Generalized abdominal pain: Secondary | ICD-10-CM | POA: Diagnosis present

## 2011-02-12 DIAGNOSIS — K859 Acute pancreatitis without necrosis or infection, unspecified: Principal | ICD-10-CM | POA: Diagnosis present

## 2011-02-12 DIAGNOSIS — F3170 Bipolar disorder, currently in remission, most recent episode unspecified: Secondary | ICD-10-CM | POA: Diagnosis present

## 2011-02-12 DIAGNOSIS — F319 Bipolar disorder, unspecified: Secondary | ICD-10-CM | POA: Diagnosis present

## 2011-02-12 HISTORY — DX: Gastro-esophageal reflux disease without esophagitis: K21.9

## 2011-02-12 LAB — CBC
HCT: 38 % (ref 36.0–46.0)
MCH: 27.8 pg (ref 26.0–34.0)
MCHC: 33.2 g/dL (ref 30.0–36.0)
MCV: 80.7 fL (ref 78.0–100.0)
Platelets: 310 10*3/uL (ref 150–400)
Platelets: 301 10*3/uL (ref 150–400)
RDW: 15.5 % (ref 11.5–15.5)
RDW: 15.3 % (ref 11.5–15.5)
WBC: 12.5 10*3/uL — ABNORMAL HIGH (ref 4.0–10.5)
WBC: 12.1 10*3/uL — ABNORMAL HIGH (ref 4.0–10.5)

## 2011-02-12 LAB — COMPREHENSIVE METABOLIC PANEL
ALT: 19 U/L (ref 0–35)
AST: 17 U/L (ref 0–37)
Albumin: 3.7 g/dL (ref 3.5–5.2)
CO2: 26 mEq/L (ref 19–32)
Calcium: 9.7 mg/dL (ref 8.4–10.5)
Chloride: 101 mEq/L (ref 96–112)
Creatinine, Ser: 0.88 mg/dL (ref 0.50–1.10)
GFR calc non Af Amer: 83 mL/min — ABNORMAL LOW (ref >90)
Sodium: 139 mEq/L (ref 135–145)
Total Bilirubin: 0.2 mg/dL — ABNORMAL LOW (ref 0.3–1.2)

## 2011-02-12 LAB — URINALYSIS, MICROSCOPIC ONLY
Glucose, UA: NEGATIVE mg/dL
Hgb urine dipstick: NEGATIVE
Leukocytes, UA: NEGATIVE
Protein, ur: NEGATIVE mg/dL
pH: 7.5 (ref 5.0–8.0)

## 2011-02-12 LAB — DIFFERENTIAL
Basophils Absolute: 0.1 10*3/uL (ref 0.0–0.1)
Basophils Relative: 1 % (ref 0–1)
Lymphocytes Relative: 36 % (ref 12–46)
Neutro Abs: 6.7 10*3/uL (ref 1.7–7.7)

## 2011-02-12 LAB — CREATININE, SERUM: GFR calc Af Amer: >90 mL/min (ref >90)

## 2011-02-12 MED ORDER — LURASIDONE HCL 80 MG PO TABS
80.0000 mg | ORAL_TABLET | Freq: Every evening | ORAL | Status: DC
  Filled 2011-02-12 (×3): qty 1

## 2011-02-12 MED ORDER — HYDROMORPHONE HCL PF 1 MG/ML IJ SOLN
2.0000 mg | INTRAMUSCULAR | Status: DC | PRN
  Administered 2011-02-12 – 2011-02-13 (×3): 2 mg via INTRAVENOUS
  Filled 2011-02-12: qty 2
  Filled 2011-02-12: qty 1
  Filled 2011-02-12: qty 2
  Filled 2011-02-12: qty 1

## 2011-02-12 MED ORDER — PANTOPRAZOLE SODIUM 40 MG IV SOLR
40.0000 mg | Freq: Every day | INTRAVENOUS | Status: DC
  Administered 2011-02-12 – 2011-02-14 (×3): 40 mg via INTRAVENOUS
  Filled 2011-02-12 (×3): qty 40

## 2011-02-12 MED ORDER — ENOXAPARIN SODIUM 40 MG/0.4ML ~~LOC~~ SOLN
40.0000 mg | SUBCUTANEOUS | Status: DC
  Administered 2011-02-13 – 2011-02-14 (×2): 40 mg via SUBCUTANEOUS
  Filled 2011-02-12 (×2): qty 0.4

## 2011-02-12 MED ORDER — DIPHENHYDRAMINE HCL 50 MG/ML IJ SOLN: 25.0000 mg | Freq: Once | INTRAMUSCULAR | Status: DC

## 2011-02-12 MED ORDER — HYDROXYZINE HCL 50 MG PO TABS
50.0000 mg | ORAL_TABLET | Freq: Every evening | ORAL | Status: DC
  Administered 2011-02-12 – 2011-02-13 (×2): 75 mg via ORAL
  Administered 2011-02-14: 50 mg via ORAL
  Filled 2011-02-12 (×3): qty 2

## 2011-02-12 MED ORDER — WHITE PETROLATUM GEL
Status: AC
  Filled 2011-02-12: qty 5

## 2011-02-12 MED ORDER — SODIUM CHLORIDE 0.9 % IV SOLN
INTRAVENOUS | Status: DC
  Administered 2011-02-12 – 2011-02-14 (×2): via INTRAVENOUS

## 2011-02-12 MED ORDER — BENZTROPINE MESYLATE 1 MG PO TABS
1.0000 mg | ORAL_TABLET | Freq: Two times a day (BID) | ORAL | Status: DC
Start: 2011-02-12 — End: 2011-02-14
  Administered 2011-02-12 – 2011-02-14 (×4): 1 mg via ORAL
  Filled 2011-02-12 (×5): qty 1

## 2011-02-12 MED ORDER — ONDANSETRON HCL 4 MG/2ML IJ SOLN
4.0000 mg | Freq: Once | INTRAMUSCULAR | Status: AC
  Administered 2011-02-12: 4 mg via INTRAVENOUS
  Filled 2011-02-12: qty 2

## 2011-02-12 MED ORDER — SODIUM CHLORIDE 0.9 % IV BOLUS (SEPSIS)
1000.0000 mL | Freq: Once | INTRAVENOUS | Status: AC
  Administered 2011-02-12: 1000 mL via INTRAVENOUS

## 2011-02-12 MED ORDER — HYDROMORPHONE HCL PF 1 MG/ML IJ SOLN
1.0000 mg | Freq: Once | INTRAMUSCULAR | Status: AC
  Administered 2011-02-12: 1 mg via INTRAVENOUS
  Filled 2011-02-12: qty 1

## 2011-02-12 MED ORDER — ONDANSETRON HCL 4 MG/2ML IJ SOLN
4.0000 mg | Freq: Four times a day (QID) | INTRAMUSCULAR | Status: DC | PRN
  Administered 2011-02-12 – 2011-02-13 (×5): 4 mg via INTRAVENOUS
  Filled 2011-02-12 (×5): qty 2

## 2011-02-12 MED ORDER — FLUPHENAZINE DECANOATE 25 MG/ML IJ SOLN
12.5000 mg | INTRAMUSCULAR | Status: DC
  Administered 2011-02-14: 12.5 mg via INTRAMUSCULAR
  Filled 2011-02-12: qty 0.5

## 2011-02-12 MED ORDER — PROMETHAZINE HCL 25 MG/ML IJ SOLN
12.5000 mg | Freq: Once | INTRAMUSCULAR | Status: AC
  Administered 2011-02-12: 12.5 mg via INTRAVENOUS
  Filled 2011-02-12: qty 1

## 2011-02-12 NOTE — ED Provider Notes (Signed)
History     CSN: [id number]  Arrival date & time 02/12/11  1627   First MD Initiated Contact with Patient 02/12/11 1709      Chief Complaint  Patient presents with  . Pancreatitis    (Consider location/radiation/quality/duration/timing/severity/associated sxs/prior treatment) Patient is a 36 y.o. female presenting with abdominal pain. The history is provided by the patient.  Abdominal Pain The primary symptoms of the illness include abdominal pain, nausea and vomiting. The primary symptoms of the illness do not include fever, shortness of breath, diarrhea, dysuria, vaginal discharge or vaginal bleeding. The current episode started 2 days ago. The onset of the illness was gradual. The problem has been gradually worsening.  Symptoms associated with the illness do not include chills or constipation.  Pt states she was just in the hospital for a week for pancreatitis. Was discharged 4 days ago. Two days ago developed increased pain, nausea, vomiting. States she has not eaten or drank anything in two days. States pain is worsening. Unable to keep medications down at home. Denies fever, chills, malaise, chest pain, SOB. States feels weak. Denies alcohol.  Past Medical History  Diagnosis Date  . Hypertension   . Pancreatitis   . Bipolar disorder   . Schizoaffective disorder     Past Surgical History  Procedure Date  . Tubal ligation   . Appendectomy     No family history on file.  History  Substance Use Topics  . Smoking status: Passive Smoker  . Smokeless tobacco: Not on file  . Alcohol Use: No     NO ETOH FOR 6 MONTHS    OB History    Grav Para Term Preterm Abortions TAB SAB Ect Mult Living                  Review of Systems  Constitutional: Negative for fever and chills.  HENT: Negative.   Eyes: Negative.   Respiratory: Negative for shortness of breath.   Gastrointestinal: Positive for nausea, vomiting and abdominal pain. Negative for diarrhea and constipation.    Genitourinary: Negative for dysuria, vaginal bleeding and vaginal discharge.  Musculoskeletal: Negative.   Skin: Negative.   Neurological: Positive for dizziness, weakness and light-headedness.  Psychiatric/Behavioral: Negative.     Allergies  Norvasc and Zithromax  Home Medications   Current Outpatient Rx  Name Route Sig Dispense Refill  . BENZTROPINE MESYLATE 1 MG PO TABS Oral Take 1 mg by mouth 2 (two) times daily.     Marland Kitchen FLUPHENAZINE DECANOATE 25 MG/ML IJ SOLN Intramuscular Inject 12.5 mg into the muscle every 14 (fourteen) days. 0.5 cc    . HYDROXYZINE HCL 25 MG PO TABS Oral Take 50-75 mg by mouth every evening. For insomnia    . LURASIDONE HCL 80 MG PO TABS Oral Take 80 mg by mouth every evening.     . OXYCODONE HCL 5 MG PO TABS Oral Take 1 tablet (5 mg total) by mouth every 4 (four) hours as needed. 20 tablet 0    BP 130/78  Pulse 94  Temp(Src) 99.2 F (37.3 C) (Oral)  Resp 22  SpO2 96%  LMP 12/04/2010  Physical Exam  Constitutional: She is oriented to person, place, and time. She appears well-developed and well-nourished.       Uncomfortable appearing  Eyes: Conjunctivae are normal.  Neck: Neck supple.  Cardiovascular: Normal rate and regular rhythm.   Pulmonary/Chest: Effort normal and breath sounds normal.  Abdominal: Soft. Bowel sounds are normal.  Epigastric and left upper quadrant tenderness, no guarding, no rebound tenderness  Musculoskeletal: She exhibits no edema.  Neurological: She is alert and oriented to person, place, and time.  Skin: Skin is warm and dry.  Psychiatric: She has a normal mood and affect.    ED Course  Procedures (including critical care time)  Pt just discharged from inpatient few days ago for pancreatitis. Will check labs, pain meds and anti emetics ordered.  Results for orders placed during the hospital encounter of 02/12/11  CBC      Component Value Range   WBC 12.5 (*) 4.0 - 10.5 (K/uL)   RBC 4.98  3.87 - 5.11 (MIL/uL)    Hemoglobin 13.5  12.0 - 15.0 (g/dL)   HCT 40.9  81.1 - 91.4 (%)   MCV 81.7  78.0 - 100.0 (fL)   MCH 27.1  26.0 - 34.0 (pg)   MCHC 33.2  30.0 - 36.0 (g/dL)   RDW 78.2  95.6 - 21.3 (%)   Platelets 310  150 - 400 (K/uL)  DIFFERENTIAL      Component Value Range   Neutrophils Relative 53  43 - 77 (%)   Neutro Abs 6.7  1.7 - 7.7 (K/uL)   Lymphocytes Relative 36  12 - 46 (%)   Lymphs Abs 4.6 (*) 0.7 - 4.0 (K/uL)   Monocytes Relative 7  3 - 12 (%)   Monocytes Absolute 0.9  0.1 - 1.0 (K/uL)   Eosinophils Relative 2  0 - 5 (%)   Eosinophils Absolute 0.3  0.0 - 0.7 (K/uL)   Basophils Relative 1  0 - 1 (%)   Basophils Absolute 0.1  0.0 - 0.1 (K/uL)  COMPREHENSIVE METABOLIC PANEL      Component Value Range   Sodium 139  135 - 145 (mEq/L)   Potassium 4.1  3.5 - 5.1 (mEq/L)   Chloride 101  96 - 112 (mEq/L)   CO2 26  19 - 32 (mEq/L)   Glucose, Bld 144 (*) 70 - 99 (mg/dL)   BUN 4 (*) 6 - 23 (mg/dL)   Creatinine, Ser 0.86  0.50 - 1.10 (mg/dL)   Calcium 9.7  8.4 - 57.8 (mg/dL)   Total Protein 8.2  6.0 - 8.3 (g/dL)   Albumin 3.7  3.5 - 5.2 (g/dL)   AST 17  0 - 37 (U/L)   ALT 19  0 - 35 (U/L)   Alkaline Phosphatase 88  39 - 117 (U/L)   Total Bilirubin 0.2 (*) 0.3 - 1.2 (mg/dL)   GFR calc non Af Amer 83 (*) >90 (mL/min)   GFR calc Af Amer >90  >90 (mL/min)  LIPASE, BLOOD      Component Value Range   Lipase 293 (*) 11 - 59 (U/L)  POCT PREGNANCY, URINE      Component Value Range   Preg Test, Ur NEGATIVE  NEGATIVE    7:24 PM Lipase higher then when admitted. Pain not controlled. Fluids running. Will admit. Discussed with Dr. Anitra Lauth, who called [facility] for readmission. Pt accepted to [facility].     No diagnosis found.    MDM          Lottie Mussel, PA 02/12/11 1926

## 2011-02-12 NOTE — ED Notes (Signed)
C/o abd pain, nausea, and vomiting x 2 days.  States she was discharged from hospital on January 30th for pancreatitis.

## 2011-02-12 NOTE — H&P (Signed)
PCP:    Chief Complaint:  Abdominal pain  HPI: 36 year old female with a history of pancreatitis, who was just discharged from the hospital 3 days ago after being treated for acute pancreatitis, getting all the imaging studies were negative. Patient was discharged after she improved. Today patient is back to the hospital, with nausea vomiting and abdominal pain. Patient says that she did not drink any alcohol, in fact has not had alcohol over the past 8 months. Patient says that she has not been able to eat or drink because of the recurrent nausea and vomiting.  Allergies:   Allergies  Allergen Reactions  . Norvasc (Amlodipine Besylate) Other (See Comments)    Mouth irritation  . Zithromax (Azithromycin) Other (See Comments)    Severe stomach cramping      Past Medical History  Diagnosis Date  . Hypertension   . Pancreatitis   . Bipolar disorder   . Schizoaffective disorder     Past Surgical History  Procedure Date  . Tubal ligation   . Appendectomy     Prior to Admission medications   Medication Sig Start Date End Date Taking? Authorizing Provider  benztropine (COGENTIN) 1 MG tablet Take 1 mg by mouth 2 (two) times daily.    Yes Historical Provider, MD  fluPHENAZine decanoate (PROLIXIN) 25 MG/ML injection Inject 12.5 mg into the muscle every 14 (fourteen) days. 0.5 cc   Yes Historical Provider, MD  hydrOXYzine (ATARAX/VISTARIL) 25 MG tablet Take 50-75 mg by mouth every evening. For insomnia   Yes Historical Provider, MD  lurasidone (LATUDA) 80 MG TABS Take 80 mg by mouth every evening.    Yes Historical Provider, MD  oxyCODONE (OXY IR/ROXICODONE) 5 MG immediate release tablet Take 1 tablet (5 mg total) by mouth every 4 (four) hours as needed. 02/08/11 02/18/11 Yes Ramiro Harvest, MD    Social History:  reports that she has been passively smoking.  She does not have any smokeless tobacco history on file. She reports that she does not drink alcohol or use illicit drugs.  No  family history on file.  Review of Systems:  HEENT: Denies headache, blurred vision, runny nose, sore throat,  Neck: Denies thyroid problems,lymphadenopathy Chest : Denies shortness of breath, no history of COPD Heart : Denies Chest pain,  coronary arterey disease GI: Admits to nausea, vomiting, diarrhea GU: Denies dysuria, urgency, frequency of urination, hematuria Neuro: Denies stroke, seizures, syncope Psych: H/o schizoaffetive disorder,    Physical Exam: Blood pressure 127/80, pulse 91, temperature 98.8 F (37.1 C), temperature source Oral, resp. rate 18, last menstrual period 12/04/2010, SpO2 96.00%. Constitutional:   Patient is a well-developed and well-nourished female  in no acute distress and cooperative with exam. Head: Normocephalic and atraumatic Mouth: Mucus membranes moist Eyes: PERRL, EOMI, conjunctivae normal Neck: Supple, No Thyromegaly Cardiovascular: RRR, S1 normal, S2 normal Pulmonary/Chest: CTAB, no wheezes, rales, or rhonchi Abdominal: Soft. Mild epigastric tenderness,  non-distended, bowel sounds are normal, no masses, organomegaly, or guarding present.  Neurological: A&O x3, Strenght is normal and symmetric bilaterally, cranial nerve II-XII are grossly intact, no focal motor deficit, sensory intact to light touch bilaterally.  Extremities : No Cyanosis, Clubbing or Edema   Labs on Admission:  Results for orders placed during the hospital encounter of 02/12/11 (from the past 48 hour(s))  CBC     Status: Abnormal   Collection Time   02/12/11  5:25 PM      Component Value Range Comment   WBC 12.5 (*) 4.0 -  10.5 (K/uL)    RBC 4.98  3.87 - 5.11 (MIL/uL)    Hemoglobin 13.5  12.0 - 15.0 (g/dL)    HCT 45.4  09.8 - 11.9 (%)    MCV 81.7  78.0 - 100.0 (fL)    MCH 27.1  26.0 - 34.0 (pg)    MCHC 33.2  30.0 - 36.0 (g/dL)    RDW 14.7  82.9 - 56.2 (%)    Platelets 310  150 - 400 (K/uL)   DIFFERENTIAL     Status: Abnormal   Collection Time   02/12/11  5:25 PM       Component Value Range Comment   Neutrophils Relative 53  43 - 77 (%)    Neutro Abs 6.7  1.7 - 7.7 (K/uL)    Lymphocytes Relative 36  12 - 46 (%)    Lymphs Abs 4.6 (*) 0.7 - 4.0 (K/uL)    Monocytes Relative 7  3 - 12 (%)    Monocytes Absolute 0.9  0.1 - 1.0 (K/uL)    Eosinophils Relative 2  0 - 5 (%)    Eosinophils Absolute 0.3  0.0 - 0.7 (K/uL)    Basophils Relative 1  0 - 1 (%)    Basophils Absolute 0.1  0.0 - 0.1 (K/uL)   COMPREHENSIVE METABOLIC PANEL     Status: Abnormal   Collection Time   02/12/11  5:25 PM      Component Value Range Comment   Sodium 139  135 - 145 (mEq/L)    Potassium 4.1  3.5 - 5.1 (mEq/L)    Chloride 101  96 - 112 (mEq/L)    CO2 26  19 - 32 (mEq/L)    Glucose, Bld 144 (*) 70 - 99 (mg/dL)    BUN 4 (*) 6 - 23 (mg/dL)    Creatinine, Ser 1.30  0.50 - 1.10 (mg/dL)    Calcium 9.7  8.4 - 10.5 (mg/dL)    Total Protein 8.2  6.0 - 8.3 (g/dL)    Albumin 3.7  3.5 - 5.2 (g/dL)    AST 17  0 - 37 (U/L)    ALT 19  0 - 35 (U/L)    Alkaline Phosphatase 88  39 - 117 (U/L)    Total Bilirubin 0.2 (*) 0.3 - 1.2 (mg/dL)    GFR calc non Af Amer 83 (*) >90 (mL/min)    GFR calc Af Amer >90  >90 (mL/min)   LIPASE, BLOOD     Status: Abnormal   Collection Time   02/12/11  5:25 PM      Component Value Range Comment   Lipase 293 (*) 11 - 59 (U/L)   POCT PREGNANCY, URINE     Status: Normal   Collection Time   02/12/11  7:11 PM      Component Value Range Comment   Preg Test, Ur NEGATIVE  NEGATIVE      Assessment/Plan  Time Spent on Admission:  Acute pancreatitis We'll start the patient on IV normal saline at 100 per hour, to also start Zofran, and Dilaudid when necessary for pain. Will keep patient n.p.o. Patient's lipase is 293, which is only mildly elevated since her last lipase was 217. Will follow patient's lipase in the morning.  Nausea, vomiting Secondary to pancreatitis, will start Zofran when necessary.  Schizoaffective disorder Continue home medications  DVT  prophylaxis Lovenox    Makeyla Govan S [facility] Pager: [phone] 02/12/2011, 7:42 PM

## 2011-02-12 NOTE — ED Notes (Signed)
D/c 02/08/2011 for pancreatitis, treated with Zofran and narcotics. Watery stools. Hyperactive bs. Emesis- green/yellow.

## 2011-02-12 NOTE — ED Provider Notes (Signed)
Medical screening examination/treatment/procedure(s) were conducted as a shared visit with non-physician practitioner(s) and myself.  I personally evaluated the patient during the encounter  Patient with a history of pancreatitis recently admitted and discharged 2 days ago with recurrent symptoms and now higher lipase from prior. Patient had CT while she was hospitalized and there were no signs of pseudocyst. On exam here patient has left upper quadrant pain no peritoneal signs. Continued vomiting so will admit.  Gwyneth Sprout, MD 02/12/11 2117

## 2011-02-13 ENCOUNTER — Inpatient Hospital Stay (HOSPITAL_COMMUNITY): Payer: Medicaid Other

## 2011-02-13 ENCOUNTER — Encounter (HOSPITAL_COMMUNITY): Payer: Self-pay | Admitting: *Deleted

## 2011-02-13 LAB — COMPREHENSIVE METABOLIC PANEL
ALT: 17 U/L (ref 0–35)
AST: 26 U/L (ref 0–37)
CO2: 24 mEq/L (ref 19–32)
Chloride: 101 mEq/L (ref 96–112)
Creatinine, Ser: 0.85 mg/dL (ref 0.50–1.10)
GFR calc Af Amer: >90 mL/min (ref >90)
GFR calc non Af Amer: 86 mL/min — ABNORMAL LOW (ref >90)
Glucose, Bld: 121 mg/dL — ABNORMAL HIGH (ref 70–99)
Total Bilirubin: 0.2 mg/dL — ABNORMAL LOW (ref 0.3–1.2)

## 2011-02-13 LAB — CBC
MCH: 27.8 pg (ref 26.0–34.0)
MCHC: 34 g/dL (ref 30.0–36.0)
Platelets: 238 10*3/uL (ref 150–400)
RBC: 4.43 MIL/uL (ref 3.87–5.11)

## 2011-02-13 LAB — LIPID PANEL: LDL Cholesterol: 116 mg/dL — ABNORMAL HIGH (ref 0–99)

## 2011-02-13 LAB — LIPASE, BLOOD: Lipase: 21 U/L (ref 11–59)

## 2011-02-13 LAB — ETHANOL: Alcohol, Ethyl (B): <11 mg/dL (ref 0–11)

## 2011-02-13 MED ORDER — HYDROMORPHONE HCL PF 1 MG/ML IJ SOLN
1.0000 mg | INTRAMUSCULAR | Status: DC | PRN
  Administered 2011-02-13 – 2011-02-14 (×6): 2 mg via INTRAVENOUS
  Filled 2011-02-13 (×6): qty 2

## 2011-02-13 NOTE — Progress Notes (Signed)
Utilization review completed. Holly Mendez 02/13/2011 

## 2011-02-13 NOTE — Consult Note (Signed)
Reason for Consult:Nausea and vomiting. Referring Physician: Dr. Zannie Cove  Holly Mendez is an 36 y.o. female.  HPI: Patient gives a history of nausea and vomiting x 1 week-recently discharged after being treated supportively for ?acute pancreatitis. She complains of some LUQ pain [5/10 in intensity] and some reflux with nausea and vomiting. Denies having any melena or hematochezia. No history of alcohol abuse. Lipase is 21 today. Recent CT scan of abdomen and pelvis have been normal. She had an abdominal ultrasound done in 2011 that was normal as well.  Past Medical History  Diagnosis Date  . Hypertension   . Pancreatitis   . Bipolar disorder   . Schizoaffective disorder     Past Surgical History  Procedure Date  . Tubal ligation   . Appendectomy     History reviewed. No pertinent family history.  Social History:  reports that she has been passively smoking.  She does not have any smokeless tobacco history on file. She reports that she does not drink alcohol or use illicit drugs.  Allergies:  Allergies  Allergen Reactions  . Norvasc (Amlodipine Besylate) Other (See Comments)    Mouth irritation  . Zithromax (Azithromycin) Other (See Comments)    Severe stomach cramping    Medications: I have reviewed the patient's current medications.  Results for orders placed during the hospital encounter of 02/12/11 (from the past 48 hour(s))  CBC     Status: Abnormal   Collection Time   02/12/11  5:25 PM      Component Value Range Comment   WBC 12.5 (*) 4.0 - 10.5 (K/uL)    RBC 4.98  3.87 - 5.11 (MIL/uL)    Hemoglobin 13.5  12.0 - 15.0 (g/dL)    HCT 78.4  69.6 - 29.5 (%)    MCV 81.7  78.0 - 100.0 (fL)    MCH 27.1  26.0 - 34.0 (pg)    MCHC 33.2  30.0 - 36.0 (g/dL)    RDW 28.4  13.2 - 44.0 (%)    Platelets 310  150 - 400 (K/uL)   DIFFERENTIAL     Status: Abnormal   Collection Time   02/12/11  5:25 PM      Component Value Range Comment   Neutrophils Relative 53  43  - 77 (%)    Neutro Abs 6.7  1.7 - 7.7 (K/uL)    Lymphocytes Relative 36  12 - 46 (%)    Lymphs Abs 4.6 (*) 0.7 - 4.0 (K/uL)    Monocytes Relative 7  3 - 12 (%)    Monocytes Absolute 0.9  0.1 - 1.0 (K/uL)    Eosinophils Relative 2  0 - 5 (%)    Eosinophils Absolute 0.3  0.0 - 0.7 (K/uL)    Basophils Relative 1  0 - 1 (%)    Basophils Absolute 0.1  0.0 - 0.1 (K/uL)   COMPREHENSIVE METABOLIC PANEL     Status: Abnormal   Collection Time   02/12/11  5:25 PM      Component Value Range Comment   Sodium 139  135 - 145 (mEq/L)    Potassium 4.1  3.5 - 5.1 (mEq/L)    Chloride 101  96 - 112 (mEq/L)    CO2 26  19 - 32 (mEq/L)    Glucose, Bld 144 (*) 70 - 99 (mg/dL)    BUN 4 (*) 6 - 23 (mg/dL)    Creatinine, Ser 1.02  0.50 - 1.10 (mg/dL)    Calcium 9.7  8.4 - 10.5 (mg/dL)    Total Protein 8.2  6.0 - 8.3 (g/dL)    Albumin 3.7  3.5 - 5.2 (g/dL)    AST 17  0 - 37 (U/L)    ALT 19  0 - 35 (U/L)    Alkaline Phosphatase 88  39 - 117 (U/L)    Total Bilirubin 0.2 (*) 0.3 - 1.2 (mg/dL)    GFR calc non Af Amer 83 (*) >90 (mL/min)    GFR calc Af Amer >90  >90 (mL/min)   LIPASE, BLOOD     Status: Abnormal   Collection Time   02/12/11  5:25 PM      Component Value Range Comment   Lipase 293 (*) 11 - 59 (U/L)   POCT PREGNANCY, URINE     Status: Normal   Collection Time   02/12/11  7:11 PM      Component Value Range Comment   Preg Test, Ur NEGATIVE  NEGATIVE    URINALYSIS, WITH MICROSCOPIC     Status: Abnormal   Collection Time   02/12/11  7:50 PM      Component Value Range Comment   Color, Urine YELLOW  YELLOW     APPearance CLOUDY (*) CLEAR     Specific Gravity, Urine 1.019  1.005 - 1.030     pH 7.5  5.0 - 8.0     Glucose, UA NEGATIVE  NEGATIVE (mg/dL)    Hgb urine dipstick NEGATIVE  NEGATIVE     Bilirubin Urine NEGATIVE  NEGATIVE     Ketones, ur NEGATIVE  NEGATIVE (mg/dL)    Protein, ur NEGATIVE  NEGATIVE (mg/dL)    Urobilinogen, UA 0.2  0.0 - 1.0 (mg/dL)    Nitrite NEGATIVE  NEGATIVE      Leukocytes, UA NEGATIVE  NEGATIVE     WBC, UA 0-2  <3 (WBC/hpf)    RBC / HPF 0-2  <3 (RBC/hpf)    Bacteria, UA MANY (*) RARE     Squamous Epithelial / LPF FEW (*) RARE     Casts HYALINE CASTS (*) NEGATIVE     Urine-Other MUCOUS PRESENT     CBC     Status: Abnormal   Collection Time   02/12/11  9:30 PM      Component Value Range Comment   WBC 12.1 (*) 4.0 - 10.5 (K/uL)    RBC 4.71  3.87 - 5.11 (MIL/uL)    Hemoglobin 13.1  12.0 - 15.0 (g/dL)    HCT 16.1  09.6 - 04.5 (%)    MCV 80.7  78.0 - 100.0 (fL)    MCH 27.8  26.0 - 34.0 (pg)    MCHC 34.5  30.0 - 36.0 (g/dL)    RDW 40.9  81.1 - 91.4 (%)    Platelets 301  150 - 400 (K/uL)   CREATININE, SERUM     Status: Abnormal   Collection Time   02/12/11  9:30 PM      Component Value Range Comment   Creatinine, Ser 0.84  0.50 - 1.10 (mg/dL)    GFR calc non Af Amer 88 (*) >90 (mL/min)    GFR calc Af Amer >90  >90 (mL/min)   COMPREHENSIVE METABOLIC PANEL     Status: Abnormal   Collection Time   02/13/11  6:20 AM      Component Value Range Comment   Sodium 134 (*) 135 - 145 (mEq/L)    Potassium 4.7  3.5 - 5.1 (mEq/L) HEMOLYSIS AT THIS LEVEL MAY AFFECT  RESULT   Chloride 101  96 - 112 (mEq/L)    CO2 24  19 - 32 (mEq/L)    Glucose, Bld 121 (*) 70 - 99 (mg/dL)    BUN 6  6 - 23 (mg/dL)    Creatinine, Ser 3.08  0.50 - 1.10 (mg/dL)    Calcium 9.1  8.4 - 10.5 (mg/dL)    Total Protein 7.6  6.0 - 8.3 (g/dL)    Albumin 3.4 (*) 3.5 - 5.2 (g/dL)    AST 26  0 - 37 (U/L) HEMOLYSIS AT THIS LEVEL MAY AFFECT RESULT   ALT 17  0 - 35 (U/L) HEMOLYSIS AT THIS LEVEL MAY AFFECT RESULT   Alkaline Phosphatase 73  39 - 117 (U/L) HEMOLYSIS AT THIS LEVEL MAY AFFECT RESULT   Total Bilirubin 0.2 (*) 0.3 - 1.2 (mg/dL)    GFR calc non Af Amer 86 (*) >90 (mL/min)    GFR calc Af Amer >90  >90 (mL/min)   CBC     Status: Abnormal   Collection Time   02/13/11  6:20 AM      Component Value Range Comment   WBC 11.6 (*) 4.0 - 10.5 (K/uL) WHITE COUNT CONFIRMED ON SMEAR   RBC 4.43   3.87 - 5.11 (MIL/uL)    Hemoglobin 12.3  12.0 - 15.0 (g/dL)    HCT 65.7  84.6 - 96.2 (%)    MCV 81.7  78.0 - 100.0 (fL)    MCH 27.8  26.0 - 34.0 (pg)    MCHC 34.0  30.0 - 36.0 (g/dL)    RDW 95.2 (*) 84.1 - 15.5 (%)    Platelets 238  150 - 400 (K/uL) PLATELET CLUMPS NOTED ON SMEAR  LIPASE, BLOOD     Status: Normal   Collection Time   02/13/11  6:20 AM      Component Value Range Comment   Lipase 21  11 - 59 (U/L)   ETHANOL     Status: Normal   Collection Time   02/13/11  9:15 AM      Component Value Range Comment   Alcohol, Ethyl (B) <11  0 - 11 (mg/dL)   LIPID PANEL     Status: Abnormal   Collection Time   02/13/11  9:15 AM      Component Value Range Comment   Cholesterol 176  0 - 200 (mg/dL)    Triglycerides 324  <150 (mg/dL)    HDL 31 (*) >40 (mg/dL)    Total CHOL/HDL Ratio 5.7      VLDL 29  0 - 40 (mg/dL)    LDL Cholesterol 102 (*) 0 - 99 (mg/dL)     No results found.  Review of Systems  Constitutional: Negative for fever, chills, weight loss, malaise/fatigue and diaphoresis.  HENT: Negative for hearing loss, ear pain, nosebleeds, congestion, sore throat, neck pain, tinnitus and ear discharge.   Eyes: Negative for blurred vision, double vision, photophobia, pain, discharge and redness.  Respiratory: Negative for cough, hemoptysis, sputum production, shortness of breath, wheezing and stridor.   Cardiovascular: Negative for chest pain, palpitations, orthopnea, claudication, leg swelling and PND.  Gastrointestinal: Positive for heartburn, nausea, vomiting and abdominal pain. Negative for diarrhea, constipation, blood in stool and melena.  Genitourinary: Negative for dysuria, urgency, frequency, hematuria and flank pain.  Musculoskeletal: Negative for myalgias, back pain, joint pain and falls.  Skin: Negative for itching and rash.  Neurological: Positive for dizziness and tingling. Negative for tremors, sensory change, speech change, focal weakness, seizures,  weakness and  headaches.  Endo/Heme/Allergies: Negative for environmental allergies and polydipsia. Does not bruise/bleed easily.  Psychiatric/Behavioral: Positive for depression. Negative for suicidal ideas, hallucinations, memory loss and substance abuse. The patient is nervous/anxious. The patient does not have insomnia.    Blood pressure 109/67, pulse 82, temperature 98.6 F (37 C), temperature source Oral, resp. rate 16, last menstrual period 12/04/2010, SpO2 96.00%. Physical Exam  Nursing note and vitals reviewed. Constitutional: She is oriented to person, place, and time. She appears well-developed and well-nourished.  HENT:  Head: Normocephalic and atraumatic.  Eyes: EOM are normal.  Neck: Normal range of motion. Neck supple.  Cardiovascular: Normal rate, regular rhythm and normal heart sounds.   Respiratory: Effort normal and breath sounds normal. No respiratory distress. She has no wheezes. She has no rales. She exhibits no tenderness.  GI: Soft. Bowel sounds are normal. She exhibits no distension and no mass. There is no tenderness. There is no rebound and no guarding.  Musculoskeletal: Normal range of motion.  Neurological: She is alert and oriented to person, place, and time.  Skin: Skin is warm and dry.  Psychiatric: She has a normal mood and affect. Her behavior is normal.    Assessment/Plan: 1) Nausea with vomiting and LUQ pain: Will proceed with an EGD tomorrow. Continue PPI's for now. 2) Morbid obesity.  3) Hiatal hernia-noted on previous imaging.  4) Bipolar disorder: On Latuda.  Holly Mendez 02/13/2011, 5:51 PM

## 2011-02-13 NOTE — Progress Notes (Addendum)
Subjective: Still with intermittent abd pain, epigastrium and LUQ, heart burn Reports she felt a little better when she went home but not completely, abd pain was associated with nausea and vomiting yesterday Apparently has not eaten anything in 3 days  Objective: Vital signs in last 24 hours: Temp:  [97.6 F (36.4 C)-99.2 F (37.3 C)] 97.6 F (36.4 C) (02/04 0519) Pulse Rate:  [87-95] 87  (02/04 0519) Resp:  [18-22] 20  (02/04 0519) BP: (107-130)/(69-80) 107/69 mmHg (02/04 0519) SpO2:  [96 %] 96 % (02/04 0519) Weight change:  Last BM Date: 02/12/11  Intake/Output from previous day: 02/03 0701 - 02/04 0700 In: 1050 [I.V.:1050] Out: -      Physical Exam: General: Alert, awake, oriented x3, in no acute distress. HEENT: No bruits, no goiter. Heart: Regular rate and rhythm, without murmurs, rubs, gallops. Lungs: Clear to auscultation bilaterally. Abdomen: Soft, mild epigastric and LUQ tenderness, nondistended, positive bowel sounds, no flank tenderness Extremities: No clubbing cyanosis or edema with positive pedal pulses. Neuro: Grossly intact, nonfocal.  Lab Results: Basic Metabolic Panel:  Basename 02/13/11 0620 02/12/11 2130 02/12/11 1725  NA 134* -- 139  K 4.7 -- 4.1  CL 101 -- 101  CO2 24 -- 26  GLUCOSE 121* -- 144*  BUN 6 -- 4*  CREATININE 0.85 0.84 --  CALCIUM 9.1 -- 9.7  MG -- -- --  PHOS -- -- --   Liver Function Tests:  [facility] 02/13/11 0620 02/12/11 1725  AST 26 17  ALT 17 19  ALKPHOS 73 88  BILITOT 0.2* 0.2*  PROT 7.6 8.2  ALBUMIN 3.4* 3.7    Basename 02/13/11 0620 02/12/11 1725  LIPASE 21 293*  AMYLASE -- --   No results found for this basename: AMMONIA:2 in the last 72 hours CBC:  Basename 02/13/11 0620 02/12/11 2130 02/12/11 1725  WBC 11.6* 12.1* --  NEUTROABS -- -- 6.7  HGB 12.3 13.1 --  HCT 36.2 38.0 --  MCV 81.7 80.7 --  PLT 238 301 --   Cardiac Enzymes: No results found for this basename:  CKTOTAL:3,CKMB:3,CKMBINDEX:3,TROPONINI:3 in the last 72 hours BNP: No results found for this basename: PROBNP:3 in the last 72 hours D-Dimer: No results found for this basename: DDIMER:2 in the last 72 hours CBG: No results found for this basename: GLUCAP:6 in the last 72 hours Hemoglobin A1C: No results found for this basename: HGBA1C in the last 72 hours Fasting Lipid Panel: No results found for this basename: CHOL,HDL,LDLCALC,TRIG,CHOLHDL,LDLDIRECT in the last 72 hours Thyroid Function Tests: No results found for this basename: TSH,T4TOTAL,FREET4,T3FREE,THYROIDAB in the last 72 hours Anemia Panel: No results found for this basename: VITAMINB12,FOLATE,FERRITIN,TIBC,IRON,RETICCTPCT in the last 72 hours Coagulation: No results found for this basename: LABPROT:2,INR:2 in the last 72 hours Urine Drug Screen: Drugs of Abuse     Component Value Date/Time   LABOPIA NONE DETECTED 09/01/2009 2113   COCAINSCRNUR NONE DETECTED 09/01/2009 2113   LABBENZ NONE DETECTED 09/01/2009 2113   AMPHETMU NONE DETECTED 09/01/2009 2113   THCU NONE DETECTED 09/01/2009 2113   LABBARB  Value: NONE DETECTED        DRUG SCREEN FOR MEDICAL PURPOSES ONLY.  IF CONFIRMATION IS NEEDED FOR ANY PURPOSE, NOTIFY LAB WITHIN 5 DAYS.        LOWEST DETECTABLE LIMITS FOR URINE DRUG SCREEN Drug Class       Cutoff (ng/mL) Amphetamine      1000 Barbiturate      200 Benzodiazepine   200 Tricyclics  300 Opiates          300 Cocaine          300 THC              50 09/01/2009 2113    Alcohol Level: No results found for this basename: ETH:2 in the last 72 hours Urinalysis:  Basename 02/12/11 1950  COLORURINE YELLOW  LABSPEC 1.019  PHURINE 7.5  GLUCOSEU NEGATIVE  HGBUR NEGATIVE  BILIRUBINUR NEGATIVE  KETONESUR NEGATIVE  PROTEINUR NEGATIVE  UROBILINOGEN 0.2  NITRITE NEGATIVE  LEUKOCYTESUR NEGATIVE    No results found for this or any previous visit (from the past 240 hour(s)).  Studies/Results: No results  found.  Medications: Scheduled Meds:   . benztropine  1 mg Oral BID  . enoxaparin  40 mg Subcutaneous Q24H  . fluPHENAZine decanoate  12.5 mg Intramuscular Q14 Days  .  HYDROmorphone (DILAUDID) injection  1 mg Intravenous Once  .  HYDROmorphone (DILAUDID) injection  1 mg Intravenous Once  . hydrOXYzine  50-75 mg Oral QPM  . lurasidone  80 mg Oral QPM  . ondansetron  4 mg Intravenous Once  . pantoprazole (PROTONIX) IV  40 mg Intravenous Daily  . promethazine  12.5 mg Intravenous Once  . sodium chloride  1,000 mL Intravenous Once  . white petrolatum      . DISCONTD: diphenhydrAMINE  25 mg Intravenous Once   Continuous Infusions:   . sodium chloride 100 mL/hr at 02/12/11 2100   PRN Meds:.HYDROmorphone (DILAUDID) injection, ondansetron (ZOFRAN) IV  Assessment/Plan: 1. Abdominal pain: chronic pancreatitis of unclear etiology vs gastritis/PUD, readmission in 3 days Advance to clears, decrease IVF CT with no evidence of gall stones, also check RUQ USG,  pt reports abstinence from ETOH for 8 months Check Triglycerides. Request GI consult, does she need an EGD or ERCP defer to GI opinion PPI trial 2.  Bipolar affective disorder:continue Cogentin and lurasidone 3: DVT prophylaxis: lovenox    LOS: 1 day   Nolia Tschantz [facility] Pager: [phone] 02/13/2011, 9:47 AM

## 2011-02-14 ENCOUNTER — Encounter (HOSPITAL_COMMUNITY)
Admission: EM | Disposition: A | Discharge status: Home / Self Care | Payer: Self-pay | Source: Home / Self Care | Attending: Internal Medicine

## 2011-02-14 ENCOUNTER — Encounter (HOSPITAL_COMMUNITY): Payer: Self-pay | Admitting: *Deleted

## 2011-02-14 HISTORY — PX: ESOPHAGOGASTRODUODENOSCOPY: SHX5428

## 2011-02-14 SURGERY — EGD (ESOPHAGOGASTRODUODENOSCOPY)
Anesthesia: Moderate Sedation

## 2011-02-14 MED ORDER — PANTOPRAZOLE SODIUM 40 MG PO TBEC: 40.0000 mg | DELAYED_RELEASE_TABLET | Freq: Every day | ORAL | Status: DC

## 2011-02-14 MED ORDER — PANTOPRAZOLE SODIUM 40 MG PO TBEC: 40.0000 mg | DELAYED_RELEASE_TABLET | Freq: Two times a day (BID) | ORAL | Status: DC

## 2011-02-14 MED ORDER — FENTANYL CITRATE 0.05 MG/ML IJ SOLN
INTRAMUSCULAR | Status: AC
  Filled 2011-02-14: qty 2

## 2011-02-14 MED ORDER — MIDAZOLAM HCL 10 MG/2ML IJ SOLN
INTRAMUSCULAR | Status: DC | PRN
  Administered 2011-02-14: 1 mg via INTRAVENOUS
  Administered 2011-02-14 (×2): 2 mg via INTRAVENOUS

## 2011-02-14 MED ORDER — MIDAZOLAM HCL 10 MG/2ML IJ SOLN
INTRAMUSCULAR | Status: AC
  Filled 2011-02-14: qty 2

## 2011-02-14 MED ORDER — FENTANYL NICU IV SYRINGE 50 MCG/ML
INJECTION | INTRAMUSCULAR | Status: DC | PRN
  Administered 2011-02-14 (×2): 25 ug via INTRAVENOUS

## 2011-02-14 MED ORDER — PANTOPRAZOLE SODIUM 40 MG PO TBEC
40.0000 mg | DELAYED_RELEASE_TABLET | Freq: Two times a day (BID) | ORAL | Status: DC
  Administered 2011-02-14: 40 mg via ORAL
  Filled 2011-02-14: qty 1

## 2011-02-14 NOTE — Interval H&P Note (Signed)
History and Physical Interval Note:  02/14/2011 2:47 PM  Holly Mendez  has presented today for surgery, with the diagnosis of pancreatitis  The various methods of treatment have been discussed with the patient and family. After consideration of risks, benefits and other options for treatment, the patient has consented to  Procedure(s): ESOPHAGOGASTRODUODENOSCOPY (EGD) as a surgical intervention .  The patients' history has been reviewed, patient examined, no change in status, stable for surgery.  I have reviewed the patients' chart and labs.  Questions were answered to the patient's satisfaction.     Auset Fritzler D

## 2011-02-14 NOTE — H&P (View-Only) (Signed)
Subjective: Still with intermittent abd pain, epigastrium and LUQ, heart burn Reports she felt a little better when she went home but not completely, abd pain was associated with nausea and vomiting yesterday Apparently has not eaten anything in 3 days  Objective: Vital signs in last 24 hours: Temp:  [97.6 F (36.4 C)-99.2 F (37.3 C)] 97.6 F (36.4 C) (02/04 0519) Pulse Rate:  [87-95] 87  (02/04 0519) Resp:  [18-22] 20  (02/04 0519) BP: (107-130)/(69-80) 107/69 mmHg (02/04 0519) SpO2:  [96 %] 96 % (02/04 0519) Weight change:  Last BM Date: 02/12/11  Intake/Output from previous day: 02/03 0701 - 02/04 0700 In: 1050 [I.V.:1050] Out: -      Physical Exam: General: Alert, awake, oriented x3, in no acute distress. HEENT: No bruits, no goiter. Heart: Regular rate and rhythm, without murmurs, rubs, gallops. Lungs: Clear to auscultation bilaterally. Abdomen: Soft, mild epigastric and LUQ tenderness, nondistended, positive bowel sounds, no flank tenderness Extremities: No clubbing cyanosis or edema with positive pedal pulses. Neuro: Grossly intact, nonfocal.  Lab Results: Basic Metabolic Panel:  Basename 02/13/11 0620 02/12/11 2130 02/12/11 1725  NA 134* -- 139  K 4.7 -- 4.1  CL 101 -- 101  CO2 24 -- 26  GLUCOSE 121* -- 144*  BUN 6 -- 4*  CREATININE 0.85 0.84 --  CALCIUM 9.1 -- 9.7  MG -- -- --  PHOS -- -- --   Liver Function Tests:  Basename 02/13/11 0620 02/12/11 1725  AST 26 17  ALT 17 19  ALKPHOS 73 88  BILITOT 0.2* 0.2*  PROT 7.6 8.2  ALBUMIN 3.4* 3.7    Basename 02/13/11 0620 02/12/11 1725  LIPASE 21 293*  AMYLASE -- --   No results found for this basename: AMMONIA:2 in the last 72 hours CBC:  Basename 02/13/11 0620 02/12/11 2130 02/12/11 1725  WBC 11.6* 12.1* --  NEUTROABS -- -- 6.7  HGB 12.3 13.1 --  HCT 36.2 38.0 --  MCV 81.7 80.7 --  PLT 238 301 --   Cardiac Enzymes: No results found for this basename:  CKTOTAL:3,CKMB:3,CKMBINDEX:3,TROPONINI:3 in the last 72 hours BNP: No results found for this basename: PROBNP:3 in the last 72 hours D-Dimer: No results found for this basename: DDIMER:2 in the last 72 hours CBG: No results found for this basename: GLUCAP:6 in the last 72 hours Hemoglobin A1C: No results found for this basename: HGBA1C in the last 72 hours Fasting Lipid Panel: No results found for this basename: CHOL,HDL,LDLCALC,TRIG,CHOLHDL,LDLDIRECT in the last 72 hours Thyroid Function Tests: No results found for this basename: TSH,T4TOTAL,FREET4,T3FREE,THYROIDAB in the last 72 hours Anemia Panel: No results found for this basename: VITAMINB12,FOLATE,FERRITIN,TIBC,IRON,RETICCTPCT in the last 72 hours Coagulation: No results found for this basename: LABPROT:2,INR:2 in the last 72 hours Urine Drug Screen: Drugs of Abuse     Component Value Date/Time   LABOPIA NONE DETECTED 09/01/2009 2113   COCAINSCRNUR NONE DETECTED 09/01/2009 2113   LABBENZ NONE DETECTED 09/01/2009 2113   AMPHETMU NONE DETECTED 09/01/2009 2113   THCU NONE DETECTED 09/01/2009 2113   LABBARB  Value: NONE DETECTED        DRUG SCREEN FOR MEDICAL PURPOSES ONLY.  IF CONFIRMATION IS NEEDED FOR ANY PURPOSE, NOTIFY LAB WITHIN 5 DAYS.        LOWEST DETECTABLE LIMITS FOR URINE DRUG SCREEN Drug Class       Cutoff (ng/mL) Amphetamine      1000 Barbiturate      200 Benzodiazepine   200 Tricyclics         300 Opiates          300 Cocaine          300 THC              50 09/01/2009 2113    Alcohol Level: No results found for this basename: ETH:2 in the last 72 hours Urinalysis:  Basename 02/12/11 1950  COLORURINE YELLOW  LABSPEC 1.019  PHURINE 7.5  GLUCOSEU NEGATIVE  HGBUR NEGATIVE  BILIRUBINUR NEGATIVE  KETONESUR NEGATIVE  PROTEINUR NEGATIVE  UROBILINOGEN 0.2  NITRITE NEGATIVE  LEUKOCYTESUR NEGATIVE    No results found for this or any previous visit (from the past 240 hour(s)).  Studies/Results: No results  found.  Medications: Scheduled Meds:   . benztropine  1 mg Oral BID  . enoxaparin  40 mg Subcutaneous Q24H  . fluPHENAZine decanoate  12.5 mg Intramuscular Q14 Days  .  HYDROmorphone (DILAUDID) injection  1 mg Intravenous Once  .  HYDROmorphone (DILAUDID) injection  1 mg Intravenous Once  . hydrOXYzine  50-75 mg Oral QPM  . lurasidone  80 mg Oral QPM  . ondansetron  4 mg Intravenous Once  . pantoprazole (PROTONIX) IV  40 mg Intravenous Daily  . promethazine  12.5 mg Intravenous Once  . sodium chloride  1,000 mL Intravenous Once  . white petrolatum      . DISCONTD: diphenhydrAMINE  25 mg Intravenous Once   Continuous Infusions:   . sodium chloride 100 mL/hr at 02/12/11 2100   PRN Meds:.HYDROmorphone (DILAUDID) injection, ondansetron (ZOFRAN) IV  Assessment/Plan: 1. Abdominal pain: chronic pancreatitis of unclear etiology vs gastritis/PUD, readmission in 3 days Advance to clears, decrease IVF CT with no evidence of gall stones, also check RUQ USG,  pt reports abstinence from ETOH for 8 months Check Triglycerides. Request GI consult, does she need an EGD or ERCP defer to GI opinion PPI trial 2.  Bipolar affective disorder:continue Cogentin and lurasidone 3: DVT prophylaxis: lovenox    LOS: 1 day   Shantasia Hunnell [facility] Pager: [phone] 02/13/2011, 9:47 AM   

## 2011-02-14 NOTE — Op Note (Signed)
[facility] [address] [place], [place]  [address]  OPERATIVE PROCEDURE REPORT  PATIENT:  Holly Mendez, Holly Mendez  MR#:  [id number] BIRTHDATE:  July 29, 1975  GENDER:  female ENDOSCOPIST:  Jeani Hawking, MD ASSISTANT:  Jeneen Montgomery, RN, Rolanda Lundborg and Olene Craven PROCEDURE DATE:  02/14/2011 PROCEDURE:  EGD, diagnostic [phone] ASA CLASS:  Class III INDICATIONS:  Nausea/Vomiting MEDICATIONS:  Fentanyl 50 mcg IV, Versed 5 mg IV  DESCRIPTION OF PROCEDURE:   After the risks benefits and alternatives of the procedure were thoroughly explained, informed consent was obtained.  The [id number] ([id number]) endoscope was introduced through the mouth and advanced to the second portion of the duodenum, without limitations.  The instrument was slowly withdrawn as the mucosa was fully examined. <<PROCEDUREIMAGES>>  FINDINGS:  In the distal esophagus there was evidence of a mild healed/healing esohpagitis. No other abnormaliteis were identified.    Retroflexed views revealed no abnormalities.    The scope was then withdrawn from the patient and the procedure terminated.  COMPLICATIONS:  None  IMPRESSION:  1) ? Healed or healing esophagitis. RECOMMENDATIONS:  1) Increase Protonix to 40 mg BID.  ______________________________ Jeani Hawking, MD  n. Rosalie DoctorJeani Hawking at 02/14/2011 03:12 PM  [place], [place], [phone]

## 2011-02-15 ENCOUNTER — Encounter (HOSPITAL_COMMUNITY): Payer: Self-pay | Admitting: Gastroenterology

## 2011-03-13 ENCOUNTER — Emergency Department (HOSPITAL_COMMUNITY): Payer: Medicaid Other

## 2011-03-13 ENCOUNTER — Encounter (HOSPITAL_COMMUNITY): Payer: Self-pay | Admitting: Emergency Medicine

## 2011-03-13 ENCOUNTER — Emergency Department (HOSPITAL_COMMUNITY)
Admission: EM | Admit: 2011-03-13 | Discharge: 2011-03-14 | Disposition: A | Discharge status: Home / Self Care | Payer: Medicaid Other | Attending: Emergency Medicine | Admitting: Emergency Medicine

## 2011-03-13 DIAGNOSIS — E860 Dehydration: Secondary | ICD-10-CM

## 2011-03-13 DIAGNOSIS — R197 Diarrhea, unspecified: Secondary | ICD-10-CM | POA: Insufficient documentation

## 2011-03-13 DIAGNOSIS — R112 Nausea with vomiting, unspecified: Secondary | ICD-10-CM | POA: Insufficient documentation

## 2011-03-13 DIAGNOSIS — F319 Bipolar disorder, unspecified: Secondary | ICD-10-CM | POA: Insufficient documentation

## 2011-03-13 DIAGNOSIS — Z9889 Other specified postprocedural states: Secondary | ICD-10-CM | POA: Insufficient documentation

## 2011-03-13 DIAGNOSIS — I10 Essential (primary) hypertension: Secondary | ICD-10-CM | POA: Insufficient documentation

## 2011-03-13 DIAGNOSIS — R1011 Right upper quadrant pain: Secondary | ICD-10-CM | POA: Insufficient documentation

## 2011-03-13 DIAGNOSIS — R1110 Vomiting, unspecified: Secondary | ICD-10-CM

## 2011-03-13 DIAGNOSIS — K861 Other chronic pancreatitis: Secondary | ICD-10-CM | POA: Insufficient documentation

## 2011-03-13 DIAGNOSIS — Z9851 Tubal ligation status: Secondary | ICD-10-CM | POA: Insufficient documentation

## 2011-03-13 DIAGNOSIS — R10811 Right upper quadrant abdominal tenderness: Secondary | ICD-10-CM | POA: Insufficient documentation

## 2011-03-13 DIAGNOSIS — R10816 Epigastric abdominal tenderness: Secondary | ICD-10-CM | POA: Insufficient documentation

## 2011-03-13 DIAGNOSIS — R10812 Left upper quadrant abdominal tenderness: Secondary | ICD-10-CM | POA: Insufficient documentation

## 2011-03-13 DIAGNOSIS — R630 Anorexia: Secondary | ICD-10-CM | POA: Insufficient documentation

## 2011-03-13 DIAGNOSIS — R1012 Left upper quadrant pain: Secondary | ICD-10-CM | POA: Insufficient documentation

## 2011-03-13 DIAGNOSIS — R000 Tachycardia, unspecified: Secondary | ICD-10-CM | POA: Insufficient documentation

## 2011-03-13 DIAGNOSIS — F259 Schizoaffective disorder, unspecified: Secondary | ICD-10-CM | POA: Insufficient documentation

## 2011-03-13 LAB — URINALYSIS, ROUTINE W REFLEX MICROSCOPIC
Glucose, UA: NEGATIVE mg/dL
Hgb urine dipstick: NEGATIVE
Ketones, ur: 15 mg/dL — AB
Nitrite: NEGATIVE
Protein, ur: 30 mg/dL — AB
Specific Gravity, Urine: 1.037 — ABNORMAL HIGH (ref 1.005–1.030)
Urobilinogen, UA: 0.2 mg/dL (ref 0.0–1.0)
pH: 5.5 (ref 5.0–8.0)

## 2011-03-13 LAB — LIPASE, BLOOD: Lipase: 58 U/L (ref 11–59)

## 2011-03-13 LAB — COMPREHENSIVE METABOLIC PANEL
ALT: 17 U/L (ref 0–35)
AST: 19 U/L (ref 0–37)
Albumin: 3.9 g/dL (ref 3.5–5.2)
Alkaline Phosphatase: 98 U/L (ref 39–117)
BUN: 8 mg/dL (ref 6–23)
CO2: 22 mEq/L (ref 19–32)
Calcium: 10 mg/dL (ref 8.4–10.5)
Chloride: 99 mEq/L (ref 96–112)
Creatinine, Ser: 0.82 mg/dL (ref 0.50–1.10)
GFR calc Af Amer: >90 mL/min (ref >90)
GFR calc non Af Amer: >90 mL/min (ref >90)
Glucose, Bld: 147 mg/dL — ABNORMAL HIGH (ref 70–99)
Potassium: 4.3 mEq/L (ref 3.5–5.1)
Sodium: 134 mEq/L — ABNORMAL LOW (ref 135–145)
Total Bilirubin: 0.3 mg/dL (ref 0.3–1.2)
Total Protein: 8.5 g/dL — ABNORMAL HIGH (ref 6.0–8.3)

## 2011-03-13 LAB — DIFFERENTIAL
Basophils Absolute: 0 10*3/uL (ref 0.0–0.1)
Basophils Relative: 0 % (ref 0–1)
Eosinophils Absolute: 0.2 10*3/uL (ref 0.0–0.7)
Eosinophils Relative: 2 % (ref 0–5)
Lymphocytes Relative: 10 % — ABNORMAL LOW (ref 12–46)
Lymphs Abs: 0.9 10*3/uL (ref 0.7–4.0)
Monocytes Absolute: 0.7 10*3/uL (ref 0.1–1.0)
Monocytes Relative: 8 % (ref 3–12)
Neutro Abs: 7.8 10*3/uL — ABNORMAL HIGH (ref 1.7–7.7)
Neutrophils Relative %: 81 % — ABNORMAL HIGH (ref 43–77)

## 2011-03-13 LAB — CBC
HCT: 42.5 % (ref 36.0–46.0)
Hemoglobin: 14.4 g/dL (ref 12.0–15.0)
MCH: 26.8 pg (ref 26.0–34.0)
MCHC: 33.9 g/dL (ref 30.0–36.0)
MCV: 79 fL (ref 78.0–100.0)
Platelets: 304 10*3/uL (ref 150–400)
RBC: 5.38 MIL/uL — ABNORMAL HIGH (ref 3.87–5.11)
RDW: 15.1 % (ref 11.5–15.5)
WBC: 9.6 10*3/uL (ref 4.0–10.5)

## 2011-03-13 LAB — URINE MICROSCOPIC-ADD ON

## 2011-03-13 MED ORDER — SODIUM CHLORIDE 0.9 % IV BOLUS (SEPSIS)
1000.0000 mL | Freq: Once | INTRAVENOUS | Status: AC
  Administered 2011-03-13: 1000 mL via INTRAVENOUS

## 2011-03-13 MED ORDER — ONDANSETRON HCL 4 MG/2ML IJ SOLN
4.0000 mg | Freq: Once | INTRAMUSCULAR | Status: AC
  Administered 2011-03-13: 4 mg via INTRAVENOUS
  Filled 2011-03-13: qty 2

## 2011-03-13 MED ORDER — HYDROMORPHONE HCL PF 1 MG/ML IJ SOLN
1.0000 mg | Freq: Once | INTRAMUSCULAR | Status: AC
  Administered 2011-03-13: 1 mg via INTRAVENOUS
  Filled 2011-03-13: qty 1

## 2011-03-13 NOTE — ED Provider Notes (Signed)
History     CSN: [id number]  Arrival date & time 03/13/11  1810   First MD Initiated Contact with Patient 03/13/11 2258      Chief Complaint  Patient presents with  . Abdominal Pain    (Consider location/radiation/quality/duration/timing/severity/associated sxs/prior treatment) HPI Comments: Patient with a history of pancreatitis presents with worsening RUQ and LUQ abdominal pain similar to her pancreatitis pain - states that the pain started on Friday with worsening of pain, vomiting and diarrhea - she reports a recent history of admission in February for the same thing - denies fever, chills, pregnancy, constipation, bowel surgeries, vaginal discharge or bleeding.  Patient is a 36 y.o. female presenting with abdominal pain. The history is provided by the patient. No language interpreter was used.  Abdominal Pain The primary symptoms of the illness include abdominal pain, nausea, vomiting and diarrhea. The primary symptoms of the illness do not include fever, fatigue, shortness of breath, hematemesis, hematochezia, dysuria, vaginal discharge or vaginal bleeding. The current episode started more than 2 days ago. The onset of the illness was gradual. The problem has been gradually worsening.  The abdominal pain began more than 2 days ago. The pain came on gradually. The abdominal pain is located in the RUQ, epigastric region and LUQ. The abdominal pain does not radiate. The severity of the abdominal pain is 7/10. The abdominal pain is relieved by nothing. The abdominal pain is exacerbated by vomiting.  The illness is associated with eating. The patient states that she believes she is currently not pregnant. The patient has not had a change in bowel habit. Additional symptoms associated with the illness include anorexia. Symptoms associated with the illness do not include chills, diaphoresis, heartburn, constipation, urgency, hematuria, frequency or back pain.    Past Medical History  Diagnosis  Date  . Hypertension   . Pancreatitis   . Bipolar disorder   . Schizoaffective disorder   . GERD (gastroesophageal reflux disease)     Past Surgical History  Procedure Date  . Tubal ligation   . Appendectomy   . Esophagogastroduodenoscopy 02/14/2011    Procedure: ESOPHAGOGASTRODUODENOSCOPY (EGD);  Surgeon: Theda Belfast, MD;  Location: [facility] ENDOSCOPY;  Service: Endoscopy;  Laterality: N/A;    Family History  Problem Relation Age of Onset  . Anesthesia problems Neg Hx   . Hypotension Neg Hx   . Malignant hyperthermia Neg Hx   . Pseudochol deficiency Neg Hx     History  Substance Use Topics  . Smoking status: Passive Smoker  . Smokeless tobacco: Not on file  . Alcohol Use: No     NO ETOH FOR 6 MONTHS    OB History    Grav Para Term Preterm Abortions TAB SAB Ect Mult Living                  Review of Systems  Constitutional: Negative for fever, chills, diaphoresis and fatigue.  Respiratory: Negative for shortness of breath.   Gastrointestinal: Positive for nausea, vomiting, abdominal pain, diarrhea and anorexia. Negative for heartburn, constipation, hematochezia and hematemesis.  Genitourinary: Negative for dysuria, urgency, frequency, hematuria, vaginal bleeding and vaginal discharge.  Musculoskeletal: Negative for back pain.  All other systems reviewed and are negative.    Allergies  Norvasc and Zithromax  Home Medications   Current Outpatient Rx  Name Route Sig Dispense Refill  . HYDROXYZINE HCL 25 MG PO TABS Oral Take 50-75 mg by mouth every evening. For insomnia    . LURASIDONE HCL 80  MG PO TABS Oral Take 80 mg by mouth every evening.     Marland Kitchen RISPERIDONE 1 MG PO TABS Oral Take 1 mg by mouth daily.      BP 117/83  Pulse 108  Temp(Src) 97.7 F (36.5 C) (Oral)  Resp 20  SpO2 96%  LMP 01/13/2011  Physical Exam  Nursing note and vitals reviewed. Constitutional: She is oriented to person, place, and time. She appears well-developed and well-nourished. She  appears distressed.  HENT:  Head: Normocephalic and atraumatic.  Right Ear: External ear normal.  Left Ear: External ear normal.  Nose: Nose normal.  Mouth/Throat: No oropharyngeal exudate.       Dry mucous membranes  Eyes: Conjunctivae are normal. Pupils are equal, round, and reactive to light. No scleral icterus.  Neck: Normal range of motion. Neck supple.  Cardiovascular: Regular rhythm and normal heart sounds.  Exam reveals no gallop and no friction rub.   No murmur heard.      tachycardia  Pulmonary/Chest: Effort normal and breath sounds normal. No respiratory distress. She exhibits no tenderness.  Abdominal: Soft. Bowel sounds are normal. She exhibits no distension and no mass. There is tenderness in the right upper quadrant, epigastric area and left upper quadrant. There is no rebound, no guarding and no CVA tenderness.    Musculoskeletal: Normal range of motion. She exhibits no edema and no tenderness.  Lymphadenopathy:    She has no cervical adenopathy.  Neurological: She is alert and oriented to person, place, and time. No cranial nerve deficit.  Skin: Skin is warm and dry. No rash noted. No erythema. No pallor.  Psychiatric: She has a normal mood and affect. Her behavior is normal. Judgment and thought content normal.    ED Course  Procedures (including critical care time)  Labs Reviewed  URINALYSIS, ROUTINE W REFLEX MICROSCOPIC - Abnormal; Notable for the following:    Color, Urine AMBER (*) BIOCHEMICALS MAY BE AFFECTED BY COLOR   APPearance CLOUDY (*)    Specific Gravity, Urine 1.037 (*)    Bilirubin Urine SMALL (*)    Ketones, ur 15 (*)    Protein, ur 30 (*)    Leukocytes, UA TRACE (*)    All other components within normal limits  CBC - Abnormal; Notable for the following:    RBC 5.38 (*)    All other components within normal limits  DIFFERENTIAL - Abnormal; Notable for the following:    Neutrophils Relative 81 (*)    Neutro Abs 7.8 (*)    Lymphocytes  Relative 10 (*)    All other components within normal limits  COMPREHENSIVE METABOLIC PANEL - Abnormal; Notable for the following:    Sodium 134 (*)    Glucose, Bld 147 (*)    Total Protein 8.5 (*)    All other components within normal limits  URINE MICROSCOPIC-ADD ON - Abnormal; Notable for the following:    Casts HYALINE CASTS (*)    All other components within normal limits  LIPASE, BLOOD  POCT PREGNANCY, URINE   No results found. Results for orders placed during the hospital encounter of 03/13/11  URINALYSIS, ROUTINE W REFLEX MICROSCOPIC      Component Value Range   Color, Urine AMBER (*) YELLOW    APPearance CLOUDY (*) CLEAR    Specific Gravity, Urine 1.037 (*) 1.005 - 1.030    pH 5.5  5.0 - 8.0    Glucose, UA NEGATIVE  NEGATIVE (mg/dL)   Hgb urine dipstick NEGATIVE  NEGATIVE  Bilirubin Urine SMALL (*) NEGATIVE    Ketones, ur 15 (*) NEGATIVE (mg/dL)   Protein, ur 30 (*) NEGATIVE (mg/dL)   Urobilinogen, UA 0.2  0.0 - 1.0 (mg/dL)   Nitrite NEGATIVE  NEGATIVE    Leukocytes, UA TRACE (*) NEGATIVE   LIPASE, BLOOD      Component Value Range   Lipase 58  11 - 59 (U/L)  CBC      Component Value Range   WBC 9.6  4.0 - 10.5 (K/uL)   RBC 5.38 (*) 3.87 - 5.11 (MIL/uL)   Hemoglobin 14.4  12.0 - 15.0 (g/dL)   HCT 16.1  09.6 - 04.5 (%)   MCV 79.0  78.0 - 100.0 (fL)   MCH 26.8  26.0 - 34.0 (pg)   MCHC 33.9  30.0 - 36.0 (g/dL)   RDW 40.9  81.1 - 91.4 (%)   Platelets 304  150 - 400 (K/uL)  DIFFERENTIAL      Component Value Range   Neutrophils Relative 81 (*) 43 - 77 (%)   Neutro Abs 7.8 (*) 1.7 - 7.7 (K/uL)   Lymphocytes Relative 10 (*) 12 - 46 (%)   Lymphs Abs 0.9  0.7 - 4.0 (K/uL)   Monocytes Relative 8  3 - 12 (%)   Monocytes Absolute 0.7  0.1 - 1.0 (K/uL)   Eosinophils Relative 2  0 - 5 (%)   Eosinophils Absolute 0.2  0.0 - 0.7 (K/uL)   Basophils Relative 0  0 - 1 (%)   Basophils Absolute 0.0  0.0 - 0.1 (K/uL)  COMPREHENSIVE METABOLIC PANEL      Component Value Range     Sodium 134 (*) 135 - 145 (mEq/L)   Potassium 4.3  3.5 - 5.1 (mEq/L)   Chloride 99  96 - 112 (mEq/L)   CO2 22  19 - 32 (mEq/L)   Glucose, Bld 147 (*) 70 - 99 (mg/dL)   BUN 8  6 - 23 (mg/dL)   Creatinine, Ser 7.82  0.50 - 1.10 (mg/dL)   Calcium 95.6  8.4 - 10.5 (mg/dL)   Total Protein 8.5 (*) 6.0 - 8.3 (g/dL)   Albumin 3.9  3.5 - 5.2 (g/dL)   AST 19  0 - 37 (U/L)   ALT 17  0 - 35 (U/L)   Alkaline Phosphatase 98  39 - 117 (U/L)   Total Bilirubin 0.3  0.3 - 1.2 (mg/dL)   GFR calc non Af Amer >90  >90 (mL/min)   GFR calc Af Amer >90  >90 (mL/min)  POCT PREGNANCY, URINE      Component Value Range   Preg Test, Ur NEGATIVE  NEGATIVE   URINE MICROSCOPIC-ADD ON      Component Value Range   Squamous Epithelial / LPF RARE  RARE    WBC, UA 3-6  <3 (WBC/hpf)   RBC / HPF 0-2  <3 (RBC/hpf)   Bacteria, UA RARE  RARE    Casts HYALINE CASTS (*) NEGATIVE    Urine-Other MUCOUS PRESENT     US Abdomen Complete  03/14/2011  *RADIOLOGY REPORT*  Clinical Data:  Right upper quadrant pain for 4 days, nausea, vomiting  ULTRASOUND ABDOMEN:  Technique:  Sonography of upper abdominal structures was performed.  Comparison:  02/13/2011  Gallbladder:  Normally distended without stones or wall thickening. No pericholecystic fluid or sonographic Murphy sign.  Common bile duct:  Upper normal caliber 5.5 mm diameter.  Liver:  Normal echogenicity.  Question mildly enlarged.  No focal mass or nodularity.  IVC:  Normal appearance  Pancreas:  Portion of body is adequately visualize and grossly normal in appearance with remainder obscured by bowel gas.  Spleen:  7.4 cm length, normal morphology  Right kidney:  13.3 cm length. Normal morphology without mass or hydronephrosis.  Left kidney:  11.9 cm length. Normal morphology without mass or hydronephrosis.  Aorta:  Distally obscured by bowel gas, proximally normal caliber  Other:  No free fluid  IMPRESSION: Question mildly enlarged liver. Incomplete visualization of aorta and  pancreas. No acute abnormalities identified. No interval change.  Original Report Authenticated By: Lollie Marrow, M.D.   US Abdomen Complete  02/13/2011  *RADIOLOGY REPORT*  Clinical Data:  Right-sided abdominal pain, hypertension  COMPLETE ABDOMINAL ULTRASOUND  Comparison:  CT 02/06/2011  Findings:  Gallbladder:  No shadowing gallstones or echogenic sludge.  No gallbladder wall thickening or pericholecystic fluid.  Negative sonographic Murphy's sign according to the ultrasound technologist.  Common bile duct:  4.6 mm diameter, unremarkable  Liver:  Mildly enlarged, 18.7 cm craniocaudal length, without focal lesion or intrahepatic biliary ductal dilatation.  IVC:  Appears normal.  Pancreas:  No focal abnormality seen.  Spleen:  9.3 cm craniocaudal length, unremarkable.  Right Kidney:  12.3 cm. No hydronephrosis.  Well-preserved cortex. Normal size and parenchymal echotexture without focal abnormalities.  Left Kidney:  13 cm. No hydronephrosis.  Well-preserved cortex. Normal size and parenchymal echotexture without focal abnormalities.  Abdominal aorta:  No aneurysm identified.  IMPRESSION:  1.  Normal gallbladder. 2.  Mild hepatomegaly as before.  Original Report Authenticated By: Osa Craver, M.D.      Abdominal pain Vomiting Dehydration    MDM  Patient report mild improvement in pain after medication - will try oral trial of fluids to see if she can tolerate po's - if so, she can be discharged home with pain and nausea medications.      Patient able to tolerate po fluids - heart rate down to 98, will discharge home with pain and nausea medication.  She is to follow up with [facility] on the 13th of March, she knows to return here with any worsening of symptoms.  Izola Price Cash, [place] 03/14/11 [phone]

## 2011-03-13 NOTE — ED Notes (Signed)
Patient states she is not throwing up as much with pain 8 out of 10.  Alert and oriented.  Labs being drawn at this time.

## 2011-03-13 NOTE — ED Notes (Signed)
Patient states she has vomited once since last assessment.  Patient alert and oriented at this time.  Pain is unchanged continues to rate at 8

## 2011-03-13 NOTE — ED Notes (Signed)
PT. REPORTS MID ABDOMINAL PAIN WITH VOMITTING AND DIARRHEA FOR 4 DAYS , STATES HISTORY OF PANCREATITIS , DENIES FEVER OR CHILLS.

## 2011-03-13 NOTE — ED Notes (Signed)
Patient approached desk about wait time.  Informed patient she was next on list.   Patient alert and oriented with no acute distress noted.  Apologized to patient for extended wait time.

## 2011-03-14 MED ORDER — HYDROMORPHONE HCL PF 1 MG/ML IJ SOLN
1.0000 mg | Freq: Once | INTRAMUSCULAR | Status: AC
  Administered 2011-03-14: 1 mg via INTRAVENOUS
  Filled 2011-03-14: qty 1

## 2011-03-14 MED ORDER — PROMETHAZINE HCL 25 MG PO TABS: 25.0000 mg | ORAL_TABLET | Freq: Four times a day (QID) | ORAL | Status: DC | PRN

## 2011-03-14 MED ORDER — ONDANSETRON HCL 4 MG/2ML IJ SOLN
4.0000 mg | Freq: Once | INTRAMUSCULAR | Status: AC
  Administered 2011-03-14: 4 mg via INTRAVENOUS
  Filled 2011-03-14: qty 2

## 2011-03-14 MED ORDER — HYDROCODONE-ACETAMINOPHEN 5-325 MG PO TABS: 1.0000 | ORAL_TABLET | ORAL | Status: AC | PRN

## 2011-03-14 NOTE — ED Notes (Signed)
Back from radiology.

## 2011-03-14 NOTE — ED Notes (Signed)
Patient given PO challenge by request.  PA states patient may have PO fluids.

## 2011-03-14 NOTE — Discharge Summary (Signed)
Physician Discharge Summary  Patient ID: Holly Mendez MRN: [id number] DOB/AGE: [id number] y.o.  Admit date: 02/12/2011 Discharge date: 03/14/2011  Primary Care Physician:  No primary provider on file.   Discharge Diagnoses:   1. Abdominal pain 2. GERD/healing esophagitis 3.  Bipolar affective disorder, currently in remission 4. Remote history of alcohol abuse 5. Hypertension 6. History of pancreatitis   Medication List  As of 03/14/2011  6:14 PM   STOP taking these medications         oxyCODONE 5 MG immediate release tablet         TAKE these medications         hydrOXYzine 25 MG tablet   Commonly known as: ATARAX/VISTARIL   Take 50-75 mg by mouth every evening. For insomnia      lurasidone 80 MG Tabs   Commonly known as: LATUDA   Take 80 mg by mouth every evening.          3. protonix 40 mg by mouth twice a day   Disposition and Follow-up:  Dr. Loreta Ave in one month  Consults: Dr. Loreta Ave and Dr. Elnoria Howard with GI  EGD 2/5 with healing esophagitis  Significant Diagnostic Studies:  No results found.  Brief H and P: 36 year old female with a history of pancreatitis, who was just discharged from the hospital 3 days ago after being treated for acute pancreatitis, getting all the imaging studies were negative. Patient was discharged after she improved. Today patient is back to the hospital, with nausea vomiting and abdominal pain. Patient says that she did not drink any alcohol, in fact has not had alcohol over the past 8 months. Patient says that she has not been able to eat or drink because of the recurrent nausea and vomiting.  Hospital Course:  1. Abdominal pain with nausea and vomiting: she had recently been discharged from the hospital after treatment for possible pancreatitis, she had a CT of her abdomen and pelvis as well as an abdominal ultrasound which did not show any evidence of gallstones, or any other acute abdominal process. Her triglycerides were not  elevated, she also swore abstinence from alcohol for 8 months. Was subsequently seen by GI in consultation who proceeded with endoscopy, which showed healing esophagitis, she was treated symptomatically with antiemetics, PPI, low-dose narcotics with good clinical response. And was subsequently discharged home on oral protonix twice a day, after she was tolerating a regular diet. We also checked her triglycerides which came back normal Rest of her chronic medical problems remain stable  Time spent on Discharge:  Signed: Jaanvi Fizer [facility]  03/14/2011, 6:14 PM

## 2011-03-20 NOTE — ED Provider Notes (Signed)
Medical screening examination/treatment/procedure(s) were performed by non-physician practitioner and as supervising physician I was immediately available for consultation/collaboration.  Saleemah Mollenhauer, MD 03/20/11 0701 

## 2012-01-06 ENCOUNTER — Encounter (HOSPITAL_COMMUNITY): Payer: Self-pay | Admitting: *Deleted

## 2012-01-06 ENCOUNTER — Emergency Department (HOSPITAL_COMMUNITY)
Admission: EM | Admit: 2012-01-06 | Discharge: 2012-01-06 | Disposition: A | Discharge status: Home / Self Care | Payer: Medicaid Other | Attending: Emergency Medicine | Admitting: Emergency Medicine

## 2012-01-06 ENCOUNTER — Emergency Department (HOSPITAL_COMMUNITY): Payer: Medicaid Other

## 2012-01-06 DIAGNOSIS — Z8659 Personal history of other mental and behavioral disorders: Secondary | ICD-10-CM | POA: Insufficient documentation

## 2012-01-06 DIAGNOSIS — M25529 Pain in unspecified elbow: Secondary | ICD-10-CM | POA: Insufficient documentation

## 2012-01-06 DIAGNOSIS — I10 Essential (primary) hypertension: Secondary | ICD-10-CM | POA: Insufficient documentation

## 2012-01-06 DIAGNOSIS — R071 Chest pain on breathing: Secondary | ICD-10-CM | POA: Insufficient documentation

## 2012-01-06 DIAGNOSIS — Z8719 Personal history of other diseases of the digestive system: Secondary | ICD-10-CM | POA: Insufficient documentation

## 2012-01-06 DIAGNOSIS — R209 Unspecified disturbances of skin sensation: Secondary | ICD-10-CM | POA: Insufficient documentation

## 2012-01-06 DIAGNOSIS — R0789 Other chest pain: Secondary | ICD-10-CM

## 2012-01-06 DIAGNOSIS — M7500 Adhesive capsulitis of unspecified shoulder: Secondary | ICD-10-CM | POA: Insufficient documentation

## 2012-01-06 LAB — CBC
HCT: 36.9 % (ref 36.0–46.0)
MCH: 26.3 pg (ref 26.0–34.0)
MCHC: 32.5 g/dL (ref 30.0–36.0)
RDW: 14.6 % (ref 11.5–15.5)

## 2012-01-06 LAB — BASIC METABOLIC PANEL
BUN: 8 mg/dL (ref 6–23)
Calcium: 9.2 mg/dL (ref 8.4–10.5)
Creatinine, Ser: 0.76 mg/dL (ref 0.50–1.10)
GFR calc Af Amer: >90 mL/min (ref >90)
GFR calc non Af Amer: >90 mL/min (ref >90)
Potassium: 3.7 mEq/L (ref 3.5–5.1)

## 2012-01-06 LAB — POCT I-STAT TROPONIN I: Troponin i, poc: 0 ng/mL (ref 0.00–0.08)

## 2012-01-06 MED ORDER — TRAMADOL HCL 50 MG PO TABS: 50.0000 mg | ORAL_TABLET | Freq: Four times a day (QID) | ORAL | Status: DC | PRN

## 2012-01-06 MED ORDER — IBUPROFEN 800 MG PO TABS: 800.0000 mg | ORAL_TABLET | Freq: Three times a day (TID) | ORAL | Status: DC

## 2012-01-06 NOTE — ED Notes (Signed)
Pt here for chest pain, onset 48 hours ago, sts started in left arm and radiating up to chest describes as a toothache.

## 2012-01-06 NOTE — ED Notes (Signed)
Reports having left arm weakness/numbness x 2 days, has radiated up into her chest causing a throbbing chest pain. Reports dog bite 3 weeks ago and still having problems with puncture wounds and "feelilng funny" since with nausea, etc.

## 2012-01-06 NOTE — ED Provider Notes (Signed)
History     CSN: [id number]  Arrival date & time 01/06/12  0712   First MD Initiated Contact with Patient 01/06/12 [phone]      Chief Complaint  Patient presents with  . Chest Pain  . Arm Problem    (Consider location/radiation/quality/duration/timing/severity/associated sxs/prior treatment) HPI Comments: Patient presents for evaluation of left arm pain. Patient reports that the pain began 2 days ago. She notices that it hurts more at night when she lies down on her left side. Chest old her arm close to her body because that makes the pain better. Pain also worsens if she raises her arm or moves it. Pain is sharp in nature, moderate. She says she has numbness and tingling down the arm as well. Pain does cross over into the left chest underwent to the arm. She is not short of breath. No nausea or diaphoresis. She denies any injury.  Patient also wants a dog bite evaluated. She says she was bitten on the back of her leg 3 weeks ago. She says the area is healing, but it still hurts. No redness or drainage.  Patient is a 36 y.o. female presenting with chest pain.  Chest Pain Pertinent negatives for primary symptoms include no fever, no shortness of breath and no abdominal pain.  Associated symptoms include numbness.     Past Medical History  Diagnosis Date  . Hypertension   . Pancreatitis   . Bipolar disorder   . Schizoaffective disorder   . GERD (gastroesophageal reflux disease)     Past Surgical History  Procedure Date  . Tubal ligation   . Appendectomy   . Esophagogastroduodenoscopy 02/14/2011    Procedure: ESOPHAGOGASTRODUODENOSCOPY (EGD);  Surgeon: Theda Belfast, MD;  Location: [facility] ENDOSCOPY;  Service: Endoscopy;  Laterality: N/A;    Family History  Problem Relation Age of Onset  . Anesthesia problems Neg Hx   . Hypotension Neg Hx   . Malignant hyperthermia Neg Hx   . Pseudochol deficiency Neg Hx     History  Substance Use Topics  . Smoking status: Passive Smoke  Exposure - Never Smoker  . Smokeless tobacco: Not on file  . Alcohol Use: No     Comment: NO ETOH FOR 6 MONTHS    OB History    Grav Para Term Preterm Abortions TAB SAB Ect Mult Living                  Review of Systems  Constitutional: Negative for fever.  HENT: Negative.   Respiratory: Negative for shortness of breath.   Cardiovascular: Positive for chest pain.  Gastrointestinal: Negative for abdominal pain.  Skin: Negative for color change and rash.  Neurological: Positive for numbness.  All other systems reviewed and are negative.    Allergies  Norvasc and Zithromax  Home Medications   Current Outpatient Rx  Name  Route  Sig  Dispense  Refill  . OXYCODONE HCL 5 MG PO CAPS   Oral   Take 5 mg by mouth daily. pain         . PROMETHAZINE HCL 25 MG PO TABS   Oral   Take 1 tablet (25 mg total) by mouth every 6 (six) hours as needed for nausea.   30 tablet   0     BP 117/71  Pulse 86  Temp 98.4 F (36.9 C) (Oral)  Resp 18  SpO2 98%  LMP 12/20/2011  Physical Exam  Constitutional: She is oriented to person, place, and time. She  appears well-developed and well-nourished. No distress.  HENT:  Head: Normocephalic and atraumatic.  Right Ear: Hearing normal.  Nose: Nose normal.  Mouth/Throat: Oropharynx is clear and moist and mucous membranes are normal.  Eyes: Conjunctivae normal and EOM are normal. Pupils are equal, round, and reactive to light.  Neck: Normal range of motion. Neck supple.  Cardiovascular: Normal rate, regular rhythm, S1 normal and S2 normal.  Exam reveals no gallop and no friction rub.   No murmur heard. Pulmonary/Chest: Effort normal and breath sounds normal. No respiratory distress. She exhibits no tenderness.  Abdominal: Soft. Normal appearance and bowel sounds are normal. There is no hepatosplenomegaly. There is no tenderness. There is no rebound, no guarding, no tenderness at McBurney's point and negative Murphy's sign. No hernia.    Musculoskeletal: Normal range of motion.       Left shoulder: She exhibits tenderness and pain. She exhibits no deformity.  Neurological: She is alert and oriented to person, place, and time. She has normal strength. No cranial nerve deficit or sensory deficit. Coordination normal. GCS eye subscore is 4. GCS verbal subscore is 5. GCS motor subscore is 6.  Skin: Skin is warm, dry and intact. No rash noted. No cyanosis.  Psychiatric: She has a normal mood and affect. Her speech is normal and behavior is normal. Thought content normal.    ED Course  Procedures (including critical care time)   Date: 01/06/2012  Rate: 84  Rhythm: normal sinus rhythm  QRS Axis: normal  Intervals: normal  ST/T Wave abnormalities: early repolarization  Conduction Disutrbances:none  Narrative Interpretation:   Old EKG Reviewed: none available    Labs Reviewed  CBC  PROTIME-INR  POCT I-STAT TROPONIN I  BASIC METABOLIC PANEL   Dg Chest Port 1 View  01/06/2012  *RADIOLOGY REPORT*  Clinical Data: Chest pain.  PORTABLE CHEST - 1 VIEW  Comparison: 12/13/2006.  Findings: Heart and mediastinal contours are within normal limits. No focal opacities or effusions.  No acute bony abnormality.  IMPRESSION: No active cardiopulmonary disease.   Original Report Authenticated By: Charlett Nose, M.D.      No diagnosis found.    MDM  Patient presents to ER with complaints of left arm and chest pain. Symptoms have been present for 2 days. Pain is continuous, but worsens with movement. Symptoms are very consistent with musculoskeletal pain. Pain is very reproducible with palpation over the area of the axilla and anterior upper arm. Pain also significantly worsens with active and passive range of motion at the shoulder. Patient has minimal cardiac risk factors and EKG was unremarkable. Troponin was negative after 2 days. There is nothing to support cardiac etiology of the symptoms. With the current workup, patient is safe for  further evaluation by her primary care doctor as an outpatient. She'll be treated for musculoskeletal pain.  Patient did indicate that she had some numbness in her left hand. This worsens with movement as well. There is nothing to indicate a central nervous system etiology of this. All of her symptoms are consistent with inflammation in the shoulder area with possible slight nerve impingement causing the numbness and tingling. She has normal strength and sensation in the arm and hand. She has normal pulses. No further workup necessary.        Gilda Crease, MD 01/07/12 [phone]

## 2012-01-06 NOTE — ED Notes (Signed)
Registration made aware of pt need to report to animal control

## 2012-04-18 ENCOUNTER — Other Ambulatory Visit: Payer: Self-pay | Admitting: Internal Medicine

## 2012-04-18 DIAGNOSIS — K3189 Other diseases of stomach and duodenum: Secondary | ICD-10-CM

## 2012-04-24 ENCOUNTER — Ambulatory Visit
Admission: RE | Admit: 2012-04-24 | Discharge: 2012-04-24 | Disposition: A | Discharge status: Home / Self Care | Payer: Medicaid Other | Source: Ambulatory Visit | Attending: Internal Medicine | Admitting: Internal Medicine

## 2012-04-24 DIAGNOSIS — R1013 Epigastric pain: Secondary | ICD-10-CM

## 2012-10-10 ENCOUNTER — Encounter (HOSPITAL_COMMUNITY): Payer: Self-pay | Admitting: *Deleted

## 2012-10-10 ENCOUNTER — Emergency Department (HOSPITAL_COMMUNITY)
Admission: EM | Admit: 2012-10-10 | Discharge: 2012-10-10 | Disposition: A | Discharge status: Home / Self Care | Payer: Self-pay | Source: Home / Self Care | Attending: Emergency Medicine | Admitting: Emergency Medicine

## 2012-10-10 DIAGNOSIS — T148 Other injury of unspecified body region: Secondary | ICD-10-CM

## 2012-10-10 DIAGNOSIS — W57XXXA Bitten or stung by nonvenomous insect and other nonvenomous arthropods, initial encounter: Secondary | ICD-10-CM

## 2012-10-10 MED ORDER — PREDNISONE 20 MG PO TABS: 20.0000 mg | ORAL_TABLET | Freq: Two times a day (BID) | ORAL | Status: DC

## 2012-10-10 MED ORDER — DOXEPIN HCL 10 MG PO CAPS: 10.0000 mg | ORAL_CAPSULE | Freq: Three times a day (TID) | ORAL | Status: DC

## 2012-10-10 MED ORDER — TRIAMCINOLONE ACETONIDE 0.1 % EX CREA: TOPICAL_CREAM | Freq: Three times a day (TID) | CUTANEOUS | Status: DC

## 2012-10-10 NOTE — ED Provider Notes (Signed)
Chief Complaint:   Chief Complaint  Patient presents with  . Rash    History of Present Illness:   Holly Mendez is a 37 year old female who has been broken out in a rash for the past week since she helped a friend clean out a house. The house was infested with bugs and firm of all kinds. There were numerous small flying insects that a lighted on her. She's had no definite exposures to bed bugs. The rash is very itchy. It is composed of numerous small, raised, erythematous papules. She denies any fever, chills or other systemic symptoms. She's had no difficulty breathing, wheezing, or coughing. She denies any swelling of the lips, tongue, or throat.  Review of Systems:  Other than noted above, the patient denies any of the following symptoms: Systemic:  No fever, chills, sweats, weight loss, or fatigue. ENT:  No nasal congestion, rhinorrhea, sore throat, swelling of lips, tongue or throat. Resp:  No cough, wheezing, or shortness of breath. Skin:  No rash, itching, nodules, or suspicious lesions.  PMFSH:  Past medical history, family history, social history, meds, and allergies were reviewed. She is allergic to Norvasc and azithromycin. She takes oxycodone for chronic pancreatitis, hydroxyzine at bedtime for sleep, and Abilify for psychiatric issues.  Physical Exam:   Vital signs:  BP 130/77  Pulse 2  Temp(Src) 97.9 F (36.6 C) (Oral)  Resp 24  SpO2 100% Gen:  Alert, oriented, in no distress. ENT:  Pharynx clear, no intraoral lesions, moist mucous membranes. Lungs:  Clear to auscultation. Skin:  She has numerous, small, erythematous, raised maculopapules on the arms, legs, trunk, and back. None of these appear infected.  Assessment:  The encounter diagnosis was Insect bites.  Could be bed bugs, fleas, or chiggers.  Plan:   1.  Meds:  The following meds were prescribed:   New Prescriptions   DOXEPIN (SINEQUAN) 10 MG CAPSULE    Take 1 capsule (10 mg total) by mouth 3 (three)  times daily.   PREDNISONE (DELTASONE) 20 MG TABLET    Take 1 tablet (20 mg total) by mouth 2 (two) times daily.   TRIAMCINOLONE CREAM (KENALOG) 0.1 %    Apply topically 3 (three) times daily.    2.  Patient Education/Counseling:  The patient was given appropriate handouts, self care instructions, and instructed in symptomatic relief.    3.  Follow up:  The patient was told to follow up if no better in 3 to 4 days, if becoming worse in any way, and given some red flag symptoms such as worsening rash which would prompt immediate return.  Follow up here if necessary.      Reuben Likes, MD 10/10/12 1515

## 2012-10-10 NOTE — ED Notes (Signed)
Pt  Has  Symptoms  Of a  Rash        Since  Monday  She  Reports  She  Was  Cleaning  A  House  A  Few  Days  Ago  And   Noticed  The  Bites       She   Displays  No  Angioedema  And  Is  In no  Acute  Distress   Sitting upright on the  Exam table  Speaking in  Complete  sentances

## 2013-07-22 ENCOUNTER — Ambulatory Visit: Payer: Medicaid Other | Attending: Internal Medicine | Admitting: Physical Therapy

## 2013-07-22 DIAGNOSIS — M545 Low back pain, unspecified: Secondary | ICD-10-CM | POA: Insufficient documentation

## 2013-07-22 DIAGNOSIS — IMO0001 Reserved for inherently not codable concepts without codable children: Secondary | ICD-10-CM | POA: Insufficient documentation

## 2013-07-22 DIAGNOSIS — M25659 Stiffness of unspecified hip, not elsewhere classified: Secondary | ICD-10-CM | POA: Insufficient documentation

## 2013-10-28 ENCOUNTER — Emergency Department (HOSPITAL_COMMUNITY): Payer: Medicaid Other

## 2013-10-28 ENCOUNTER — Emergency Department (HOSPITAL_COMMUNITY)
Admission: EM | Admit: 2013-10-28 | Discharge: 2013-10-28 | Disposition: A | Discharge status: Home / Self Care | Payer: Medicaid Other | Attending: Emergency Medicine | Admitting: Emergency Medicine

## 2013-10-28 ENCOUNTER — Encounter (HOSPITAL_COMMUNITY): Payer: Self-pay | Admitting: Emergency Medicine

## 2013-10-28 DIAGNOSIS — R21 Rash and other nonspecific skin eruption: Secondary | ICD-10-CM | POA: Insufficient documentation

## 2013-10-28 DIAGNOSIS — Z79899 Other long term (current) drug therapy: Secondary | ICD-10-CM | POA: Insufficient documentation

## 2013-10-28 DIAGNOSIS — I10 Essential (primary) hypertension: Secondary | ICD-10-CM | POA: Insufficient documentation

## 2013-10-28 DIAGNOSIS — Z8719 Personal history of other diseases of the digestive system: Secondary | ICD-10-CM | POA: Insufficient documentation

## 2013-10-28 DIAGNOSIS — R221 Localized swelling, mass and lump, neck: Secondary | ICD-10-CM | POA: Insufficient documentation

## 2013-10-28 DIAGNOSIS — Z8659 Personal history of other mental and behavioral disorders: Secondary | ICD-10-CM | POA: Insufficient documentation

## 2013-10-28 NOTE — ED Notes (Signed)
MD reports he called patient to tell report of ultrasound. He left message.

## 2013-10-28 NOTE — ED Notes (Signed)
Pt having to leave to go pick up child and her ride is here. Called to inquire about [name], reports from radiology are the read will be done so as it is on top of list. Dr. Kathrynn Humble made aware pt is leaving. Reports he will discharge her and will call with results. Pt verbalized this. Did not want vital signs taken.

## 2013-10-28 NOTE — ED Notes (Signed)
Reports since Friday lump growing on outside of her throat. No drainage, is sore to touch. Is hard.

## 2013-11-02 NOTE — ED Provider Notes (Signed)
CSN: [id number]     Arrival date & time 10/28/13  1660 History   First MD Initiated Contact with Patient 10/28/13 [phone]     Chief Complaint  Patient presents with  . Mass     (Consider location/radiation/quality/duration/timing/severity/associated sxs/prior Treatment) HPI Comments: Pt comes in with cc of lump to the neck. Pt noted a lump to her neck on Friday. The lump has grown larger over the past few days. There is family hx of cancers, so she decided to come to the ER. There is no dysphagia, hoarseness. No n/v/f/c/weight loss.  The history is provided by the patient.    Past Medical History  Diagnosis Date  . Hypertension   . Pancreatitis   . Bipolar disorder   . Schizoaffective disorder   . GERD (gastroesophageal reflux disease)    Past Surgical History  Procedure Laterality Date  . Tubal ligation    . Appendectomy    . Esophagogastroduodenoscopy  02/14/2011    Procedure: ESOPHAGOGASTRODUODENOSCOPY (EGD);  Surgeon: Beryle Beams, MD;  Location: [facility] ENDOSCOPY;  Service: Endoscopy;  Laterality: N/A;   Family History  Problem Relation Age of Onset  . Anesthesia problems Neg Hx   . Hypotension Neg Hx   . Malignant hyperthermia Neg Hx   . Pseudochol deficiency Neg Hx    History  Substance Use Topics  . Smoking status: Passive Smoke Exposure - Never Smoker  . Smokeless tobacco: Not on file  . Alcohol Use: No     Comment: NO ETOH FOR 6 MONTHS   OB History   Grav Para Term Preterm Abortions TAB SAB Ect Mult Living                 Review of Systems  Constitutional: Negative for chills, diaphoresis, activity change and unexpected weight change.  HENT: Negative for sore throat, trouble swallowing and voice change.   Respiratory: Negative for shortness of breath.   Cardiovascular: Negative for chest pain.  Gastrointestinal: Negative for nausea, vomiting and abdominal pain.  Genitourinary: Negative for dysuria.  Musculoskeletal: Negative for neck pain.  Skin: Positive  for rash.  Neurological: Negative for headaches.      Allergies  Norvasc and Zithromax  Home Medications   Prior to Admission medications   Medication Sig Start Date End Date Taking? Authorizing Provider  oxycodone (OXY-IR) 5 MG capsule Take 5 mg by mouth 3 (three) times daily. pain   Yes Historical Provider, MD   BP 123/52  Pulse 79  Temp(Src) 97.8 F (36.6 C) (Oral)  Resp 18  Ht 5\' 5"  (1.651 m)  Wt 282 lb (127.914 kg)  BMI 46.93 kg/m2  SpO2 98% Physical Exam  Nursing note and vitals reviewed. Constitutional: She is oriented to person, place, and time. She appears well-developed and well-nourished.  HENT:  Head: Normocephalic and atraumatic.  Eyes: EOM are normal. Pupils are equal, round, and reactive to light.  Neck: Neck supple.  Indurated neck lesion, no fluctuance, size is about 2-3 in length.  Cardiovascular: Normal rate, regular rhythm and normal heart sounds.   No murmur heard. Pulmonary/Chest: Effort normal. No respiratory distress.  Abdominal: Soft. She exhibits no distension. There is no tenderness. There is no rebound and no guarding.  Neurological: She is alert and oriented to person, place, and time.  Skin: Skin is warm and dry.    ED Course  Procedures (including critical care time) Labs Review Labs Reviewed - No data to display  Imaging Review No results found.   EKG  Interpretation None      MDM   Final diagnoses:  Mass of neck    Pt comes in with neck mass.  [name] ordered - and shows: IMPRESSION: Small midline mid neck lesion is most likely a complex thyroglossal duct cyst.   Unfortunately, it took a while before [name] was done, and resulted - and pt had to leave. I called her number, as requested by her, and left a message, and requested her to pick up the records if she needs and to see ENT doctor.    Varney Biles, MD 11/02/13 [phone]

## 2013-11-21 DIAGNOSIS — F32A Depression, unspecified: Secondary | ICD-10-CM | POA: Insufficient documentation

## 2013-11-21 DIAGNOSIS — J302 Other seasonal allergic rhinitis: Secondary | ICD-10-CM | POA: Insufficient documentation

## 2013-11-21 DIAGNOSIS — J45909 Unspecified asthma, uncomplicated: Secondary | ICD-10-CM

## 2013-11-21 DIAGNOSIS — F39 Unspecified mood [affective] disorder: Secondary | ICD-10-CM | POA: Insufficient documentation

## 2013-11-21 HISTORY — DX: Unspecified asthma, uncomplicated: J45.909

## 2014-02-04 ENCOUNTER — Emergency Department (INDEPENDENT_AMBULATORY_CARE_PROVIDER_SITE_OTHER)
Admission: EM | Admit: 2014-02-04 | Discharge: 2014-02-04 | Disposition: A | Discharge status: Home / Self Care | Payer: Medicaid Other | Source: Home / Self Care | Attending: Emergency Medicine | Admitting: Emergency Medicine

## 2014-02-04 ENCOUNTER — Encounter (HOSPITAL_COMMUNITY): Payer: Self-pay

## 2014-02-04 ENCOUNTER — Emergency Department (INDEPENDENT_AMBULATORY_CARE_PROVIDER_SITE_OTHER): Payer: Medicaid Other

## 2014-02-04 DIAGNOSIS — R634 Abnormal weight loss: Secondary | ICD-10-CM

## 2014-02-04 LAB — COMPREHENSIVE METABOLIC PANEL
ALK PHOS: 93 U/L (ref 39–117)
ALT: 27 U/L (ref 0–35)
AST: 22 U/L (ref 0–37)
Albumin: 3.7 g/dL (ref 3.5–5.2)
Anion gap: 3 — ABNORMAL LOW (ref 5–15)
BILIRUBIN TOTAL: 0.3 mg/dL (ref 0.3–1.2)
BUN: 7 mg/dL (ref 6–23)
CO2: 29 mmol/L (ref 19–32)
CREATININE: 0.84 mg/dL (ref 0.50–1.10)
Calcium: 8.9 mg/dL (ref 8.4–10.5)
Chloride: 106 mmol/L (ref 96–112)
GFR calc Af Amer: >90 mL/min (ref >90)
GFR calc non Af Amer: 86 mL/min — ABNORMAL LOW (ref >90)
Glucose, Bld: 91 mg/dL (ref 70–99)
POTASSIUM: 3.9 mmol/L (ref 3.5–5.1)
SODIUM: 138 mmol/L (ref 135–145)
TOTAL PROTEIN: 7.7 g/dL (ref 6.0–8.3)

## 2014-02-04 LAB — TSH: TSH: 1.333 u[IU]/mL (ref 0.350–4.500)

## 2014-02-04 LAB — CBC WITH DIFFERENTIAL/PLATELET
Basophils Absolute: 0 10*3/uL (ref 0.0–0.1)
Basophils Relative: 0 % (ref 0–1)
Eosinophils Absolute: 0.6 10*3/uL (ref 0.0–0.7)
Eosinophils Relative: 7 % — ABNORMAL HIGH (ref 0–5)
HCT: 37.5 % (ref 36.0–46.0)
HEMOGLOBIN: 12.6 g/dL (ref 12.0–15.0)
Lymphocytes Relative: 47 % — ABNORMAL HIGH (ref 12–46)
Lymphs Abs: 3.7 10*3/uL (ref 0.7–4.0)
MCH: 26.9 pg (ref 26.0–34.0)
MCHC: 33.6 g/dL (ref 30.0–36.0)
MCV: 80 fL (ref 78.0–100.0)
MONOS PCT: 9 % (ref 3–12)
Monocytes Absolute: 0.7 10*3/uL (ref 0.1–1.0)
Neutro Abs: 2.8 10*3/uL (ref 1.7–7.7)
Neutrophils Relative %: 37 % — ABNORMAL LOW (ref 43–77)
PLATELETS: 304 10*3/uL (ref 150–400)
RBC: 4.69 MIL/uL (ref 3.87–5.11)
RDW: 15.4 % (ref 11.5–15.5)
WBC: 7.8 10*3/uL (ref 4.0–10.5)

## 2014-02-04 LAB — POCT URINALYSIS DIP (DEVICE)
Bilirubin Urine: NEGATIVE
GLUCOSE, UA: NEGATIVE mg/dL
Ketones, ur: NEGATIVE mg/dL
Leukocytes, UA: NEGATIVE
NITRITE: NEGATIVE
PH: 7 (ref 5.0–8.0)
PROTEIN: 30 mg/dL — AB
Specific Gravity, Urine: 1.025 (ref 1.005–1.030)
UROBILINOGEN UA: 0.2 mg/dL (ref 0.0–1.0)

## 2014-02-04 LAB — POCT PREGNANCY, URINE: PREG TEST UR: NEGATIVE

## 2014-02-04 MED ORDER — ONDANSETRON HCL 4 MG PO TABS: 4.0000 mg | ORAL_TABLET | Freq: Three times a day (TID) | ORAL | Status: DC | PRN

## 2014-02-04 NOTE — ED Notes (Signed)
C/o multiple problems. Has a scheduled  appointment to see her PCP in February, and was reportedly told to see someone in the ED or [facility] for her concerns. Reports 40 + lb unintentional weight loss, nausea , unable to eat. Reported history of pancreatitis , but this does not feel like her usual symptoms. Reported strong family history of CA

## 2014-02-04 NOTE — ED Notes (Signed)
Patient unable to void at this time

## 2014-02-04 NOTE — Discharge Instructions (Signed)
Your exam is normal today. We checked some basic blood work. We will call you with the results in 2 days. Please follow-up with your new doctor next week as scheduled. You will need a mammogram and Pap smear at least. Like we discussed, your nausea and loss of appetite may have a physical cause. It is also possible that you can worry yourself into loss of appetite and nausea. It is important to make sure there is not a physical cause.  Take Zofran every 8 hours as needed for nausea, to see if that will help your appetite. Make sure you are drinking plenty of fluids to help with the dizzy feeling when you stand up.

## 2014-02-04 NOTE — ED Provider Notes (Signed)
CSN: [id number]     Arrival date & time 02/04/14  5784 History   First MD Initiated Contact with Patient 02/04/14 0912     Chief Complaint  Patient presents with  . Weight Loss   (Consider location/radiation/quality/duration/timing/severity/associated sxs/prior Treatment) HPI  She is a 39 year old woman here for evaluation of multiple complaints. Her primary concern is a 40 pound weight loss over the last 3 weeks. This has been unintentional. She also reports loss of appetite and nausea. She states she has had May before meals in the last 3 weeks. She reports getting dizzy when she stands up and her legs feeling wobbly on standing. She reports one episode of vomiting. She reports some mild constipation, but does not know if there is been blood in her stool. She also reports some intermittent abdominal pain, but states this is not new for her. She states her hair is falling out. She is also noted some increasing dark spots along her rib cage. She reports some increased dyspnea on exertion. She had an ablation 6 years ago, but states she had some bleeding 2 days ago. She states she has a history of abnormal Pap smears, her last Pap smear was for 5 years ago. She has what sounds like an endometrial biopsy several years ago. She also reports that she has a family history of cancer, she reports an aunt and an uncle who had bone cancer. She also has another aunt who had lung and brain cancer.  Her only medication is Seroquel. She states the dose was increased from 150 mg to 300 mg about one month ago. She states she has some issues with pancreatitis and borderline diabetes.  Past Medical History  Diagnosis Date  . Hypertension   . Pancreatitis   . Bipolar disorder   . Schizoaffective disorder   . GERD (gastroesophageal reflux disease)    Past Surgical History  Procedure Laterality Date  . Tubal ligation    . Appendectomy    . Esophagogastroduodenoscopy  02/14/2011    Procedure:  ESOPHAGOGASTRODUODENOSCOPY (EGD);  Surgeon: Beryle Beams, MD;  Location: [facility] ENDOSCOPY;  Service: Endoscopy;  Laterality: N/A;   Family History  Problem Relation Age of Onset  . Anesthesia problems Neg Hx   . Hypotension Neg Hx   . Malignant hyperthermia Neg Hx   . Pseudochol deficiency Neg Hx   . Cancer Other    History  Substance Use Topics  . Smoking status: Passive Smoke Exposure - Never Smoker  . Smokeless tobacco: Not on file  . Alcohol Use: No     Comment: NO ETOH FOR 6 MONTHS   OB History    No data available     Review of Systems  Constitutional: Positive for appetite change. Negative for fever.  HENT: Negative.   Respiratory: Positive for shortness of breath.   Cardiovascular: Negative for chest pain, palpitations and leg swelling.  Gastrointestinal: Positive for nausea, abdominal pain and constipation. Negative for vomiting, diarrhea and blood in stool.  Musculoskeletal: Positive for back pain (chronic).  Skin: Positive for color change (dark spots along rib cage).  Neurological: Positive for dizziness (on standing) and weakness (legs feel wobbly).  Hematological: Negative for adenopathy.    Allergies  Norvasc and Zithromax  Home Medications   Prior to Admission medications   Medication Sig Start Date End Date Taking? Authorizing Provider  QUEtiapine (SEROQUEL) 400 MG tablet Take 400 mg by mouth at bedtime.   Yes Historical Provider, MD  ondansetron (ZOFRAN) 4 MG  tablet Take 1 tablet (4 mg total) by mouth every 8 (eight) hours as needed for nausea or vomiting. 02/04/14   Melony Overly, MD  oxycodone (OXY-IR) 5 MG capsule Take 5 mg by mouth 3 (three) times daily. pain    Historical Provider, MD   BP 124/81 mmHg  Pulse 98  Temp(Src) 97.8 F (36.6 C) (Oral)  Resp 16  SpO2 99% Physical Exam  Constitutional: She is oriented to person, place, and time. She appears well-developed and well-nourished. She appears distressed (anxious).  Obese woman  HENT:  Head:  Normocephalic and atraumatic.  Eyes: No scleral icterus.  Neck: Neck supple.  Cardiovascular: Normal rate, regular rhythm, normal heart sounds and intact distal pulses.   No murmur heard. Pulmonary/Chest: Effort normal and breath sounds normal. No respiratory distress. She has no wheezes. She has no rales.  Abdominal: Soft. Bowel sounds are normal. She exhibits no distension and no mass. There is tenderness (mild suprapubic and RLQ). There is no rebound and no guarding.  Limited by body habitus  Genitourinary: No breast swelling (no masses), tenderness or discharge. Pelvic exam was performed with patient prone. There is no rash on the right labia. There is no rash on the left labia. Uterus is tender. Uterus is not enlarged. Cervix exhibits no motion tenderness. Right adnexum displays no mass and no tenderness. Left adnexum displays no mass and no tenderness. There is bleeding in the vagina. No vaginal discharge found.  Bimanual exam limited due to body habitus  Musculoskeletal: She exhibits no edema or tenderness.  Lymphadenopathy:    She has no cervical adenopathy.    She has no axillary adenopathy.       Right: No inguinal and no supraclavicular adenopathy present.       Left: No inguinal and no supraclavicular adenopathy present.  Neurological: She is alert and oriented to person, place, and time. She exhibits normal muscle tone.    ED Course  Procedures (including critical care time) Labs Review Labs Reviewed  CBC WITH DIFFERENTIAL/PLATELET - Abnormal; Notable for the following:    Neutrophils Relative % 37 (*)    Lymphocytes Relative 47 (*)    Eosinophils Relative 7 (*)    All other components within normal limits  POCT URINALYSIS DIP (DEVICE) - Abnormal; Notable for the following:    Hgb urine dipstick LARGE (*)    Protein, ur 30 (*)    All other components within normal limits  COMPREHENSIVE METABOLIC PANEL  TSH  HIV ANTIBODY (ROUTINE TESTING)  POCT PREGNANCY, URINE     Imaging Review Dg Chest 2 View  02/04/2014   CLINICAL DATA:  Anorexia, night sweats, unintentional weight loss. Smoker.  EXAM: CHEST  2 VIEW  COMPARISON:  01/06/2012  FINDINGS: The lungs are clear. There are no effusions. Pulmonary vasculature is normal. Hilar, mediastinal and cardiac contours appear unremarkable.  No significant skeletal abnormalities are evident.  There is no significant interval change.  IMPRESSION: No active cardiopulmonary disease.   Electronically Signed   By: Andreas Newport M.D.   On: 02/04/2014 10:59     MDM   1. Weight loss    Exam is benign. Basic blood work collected today. Chest x-ray is unremarkable. Discussed with patient that given her frequent exposure to cancer in the last year or so, this may be a worried well situation. She was open to this idea. She will follow-up with her new PCP at [facility] week. Discussed that she will need at a minimum a mammogram  and a Pap smear. We'll give her a prescription of Zofran to help with the nausea. Follow-up as needed.    Melony Overly, MD 02/04/14 1114

## 2014-02-05 LAB — HIV ANTIBODY (ROUTINE TESTING W REFLEX): HIV Screen 4th Generation wRfx: NONREACTIVE

## 2014-02-05 NOTE — ED Notes (Addendum)
TSH 1.333, HIV non reactive.  Message sent to Dr. Bridgett Larsson. Holly Mendez 02/05/2014 She wrote back to notify pt. of neg. results, due to anxiety about them. I called pt.  Pt. verified x 2 and given results. 02/09/2014

## 2014-02-09 ENCOUNTER — Telehealth (HOSPITAL_COMMUNITY): Payer: Self-pay | Admitting: *Deleted

## 2014-02-20 ENCOUNTER — Other Ambulatory Visit: Payer: Self-pay

## 2014-02-20 DIAGNOSIS — A599 Trichomoniasis, unspecified: Secondary | ICD-10-CM | POA: Insufficient documentation

## 2014-02-20 DIAGNOSIS — Z1231 Encounter for screening mammogram for malignant neoplasm of breast: Secondary | ICD-10-CM

## 2014-02-25 ENCOUNTER — Ambulatory Visit
Admission: RE | Admit: 2014-02-25 | Discharge: 2014-02-25 | Disposition: A | Discharge status: Home / Self Care | Payer: Medicaid Other | Source: Ambulatory Visit

## 2014-02-25 DIAGNOSIS — Z1231 Encounter for screening mammogram for malignant neoplasm of breast: Secondary | ICD-10-CM

## 2014-02-27 ENCOUNTER — Other Ambulatory Visit: Payer: Self-pay | Admitting: Family Medicine

## 2014-02-27 DIAGNOSIS — R928 Other abnormal and inconclusive findings on diagnostic imaging of breast: Secondary | ICD-10-CM

## 2014-03-06 ENCOUNTER — Ambulatory Visit
Admission: RE | Admit: 2014-03-06 | Discharge: 2014-03-06 | Disposition: A | Discharge status: Home / Self Care | Payer: Medicaid Other | Source: Ambulatory Visit | Attending: Family Medicine | Admitting: Family Medicine

## 2014-03-06 DIAGNOSIS — R928 Other abnormal and inconclusive findings on diagnostic imaging of breast: Secondary | ICD-10-CM

## 2014-03-09 ENCOUNTER — Other Ambulatory Visit: Payer: Medicaid Other

## 2014-04-03 ENCOUNTER — Encounter (HOSPITAL_COMMUNITY): Payer: Self-pay | Admitting: *Deleted

## 2014-04-03 ENCOUNTER — Emergency Department (HOSPITAL_COMMUNITY)
Admission: EM | Admit: 2014-04-03 | Discharge: 2014-04-03 | Disposition: A | Discharge status: Home / Self Care | Payer: Medicaid Other | Attending: Emergency Medicine | Admitting: Emergency Medicine

## 2014-04-03 DIAGNOSIS — R110 Nausea: Secondary | ICD-10-CM | POA: Diagnosis not present

## 2014-04-03 DIAGNOSIS — Z8719 Personal history of other diseases of the digestive system: Secondary | ICD-10-CM | POA: Diagnosis not present

## 2014-04-03 DIAGNOSIS — R197 Diarrhea, unspecified: Secondary | ICD-10-CM | POA: Diagnosis not present

## 2014-04-03 DIAGNOSIS — Z79899 Other long term (current) drug therapy: Secondary | ICD-10-CM | POA: Insufficient documentation

## 2014-04-03 DIAGNOSIS — R1084 Generalized abdominal pain: Secondary | ICD-10-CM | POA: Diagnosis not present

## 2014-04-03 DIAGNOSIS — Z7951 Long term (current) use of inhaled steroids: Secondary | ICD-10-CM | POA: Diagnosis not present

## 2014-04-03 DIAGNOSIS — Z8659 Personal history of other mental and behavioral disorders: Secondary | ICD-10-CM | POA: Diagnosis not present

## 2014-04-03 DIAGNOSIS — I10 Essential (primary) hypertension: Secondary | ICD-10-CM | POA: Diagnosis not present

## 2014-04-03 LAB — CBC WITH DIFFERENTIAL/PLATELET
Basophils Absolute: 0.1 10*3/uL (ref 0.0–0.1)
Basophils Relative: 1 % (ref 0–1)
EOS PCT: 4 % (ref 0–5)
Eosinophils Absolute: 0.4 10*3/uL (ref 0.0–0.7)
HEMATOCRIT: 39 % (ref 36.0–46.0)
Hemoglobin: 13.2 g/dL (ref 12.0–15.0)
Lymphocytes Relative: 29 % (ref 12–46)
Lymphs Abs: 3.1 10*3/uL (ref 0.7–4.0)
MCH: 27.2 pg (ref 26.0–34.0)
MCHC: 33.8 g/dL (ref 30.0–36.0)
MCV: 80.2 fL (ref 78.0–100.0)
MONO ABS: 0.8 10*3/uL (ref 0.1–1.0)
MONOS PCT: 7 % (ref 3–12)
Neutro Abs: 6.3 10*3/uL (ref 1.7–7.7)
Neutrophils Relative %: 59 % (ref 43–77)
Platelets: 259 10*3/uL (ref 150–400)
RBC: 4.86 MIL/uL (ref 3.87–5.11)
RDW: 15 % (ref 11.5–15.5)
WBC: 10.7 10*3/uL — AB (ref 4.0–10.5)

## 2014-04-03 LAB — COMPREHENSIVE METABOLIC PANEL
ALT: 22 U/L (ref 0–35)
AST: 29 U/L (ref 0–37)
Albumin: 3.7 g/dL (ref 3.5–5.2)
Alkaline Phosphatase: 93 U/L (ref 39–117)
Anion gap: 8 (ref 5–15)
BUN: 6 mg/dL (ref 6–23)
CALCIUM: 9 mg/dL (ref 8.4–10.5)
CO2: 26 mmol/L (ref 19–32)
Chloride: 101 mmol/L (ref 96–112)
Creatinine, Ser: 0.93 mg/dL (ref 0.50–1.10)
GFR calc Af Amer: 88 mL/min — ABNORMAL LOW (ref >90)
GFR, EST NON AFRICAN AMERICAN: 76 mL/min — AB (ref >90)
GLUCOSE: 138 mg/dL — AB (ref 70–99)
Potassium: 3.5 mmol/L (ref 3.5–5.1)
Sodium: 135 mmol/L (ref 135–145)
Total Bilirubin: 0.4 mg/dL (ref 0.3–1.2)
Total Protein: 7.7 g/dL (ref 6.0–8.3)

## 2014-04-03 LAB — LIPASE, BLOOD: Lipase: 25 U/L (ref 11–59)

## 2014-04-03 LAB — POC OCCULT BLOOD, ED: Fecal Occult Bld: NEGATIVE

## 2014-04-03 MED ORDER — SODIUM CHLORIDE 0.9 % IV BOLUS (SEPSIS)
1000.0000 mL | Freq: Once | INTRAVENOUS | Status: AC
  Administered 2014-04-03: 1000 mL via INTRAVENOUS

## 2014-04-03 MED ORDER — LOPERAMIDE HCL 2 MG PO CAPS
4.0000 mg | ORAL_CAPSULE | Freq: Once | ORAL | Status: AC
  Administered 2014-04-03: 4 mg via ORAL
  Filled 2014-04-03: qty 2

## 2014-04-03 MED ORDER — LOPERAMIDE HCL 2 MG PO CAPS: 4.0000 mg | ORAL_CAPSULE | Freq: Once | ORAL | Status: DC

## 2014-04-03 NOTE — ED Notes (Signed)
PO challenge started

## 2014-04-03 NOTE — ED Provider Notes (Signed)
CSN: [id number]     Arrival date & time 04/03/14  1746 History   First MD Initiated Contact with Patient 04/03/14 2014     Chief Complaint  Patient presents with  . Diarrhea  . Abdominal Pain     (Consider location/radiation/quality/duration/timing/severity/associated sxs/prior Treatment) HPI Comments: Patient states she binge drank Friday night into Saturday and since that time has had 5-6 bowel movements a day on Sunday.  She had noticed blood in her stool 1.  She also noticed blood in her stool today 1.  She reports that she became nauseated but no vomiting.  She reports crampy abdominal pain prior to bowel movements.  She has not taken any medication for her symptoms nor called her primary care physician  Patient is a 39 y.o. female presenting with diarrhea and abdominal pain. The history is provided by the patient.  Diarrhea Quality:  Watery Severity:  Moderate Onset quality:  Gradual Duration:  7 days Timing:  Intermittent Progression:  Unchanged Relieved by:  None tried Ineffective treatments:  None tried Associated symptoms: abdominal pain   Associated symptoms: no fever and no vomiting   Abdominal pain:    Location:  Generalized   Quality:  Cramping   Severity:  Moderate   Onset quality:  Gradual   Timing:  Intermittent   Progression:  Unchanged   Chronicity:  New Abdominal Pain Associated symptoms: diarrhea and nausea   Associated symptoms: no chest pain, no dysuria, no fever, no shortness of breath and no vomiting     Past Medical History  Diagnosis Date  . Hypertension   . Pancreatitis   . Bipolar disorder   . Schizoaffective disorder   . GERD (gastroesophageal reflux disease)    Past Surgical History  Procedure Laterality Date  . Tubal ligation    . Appendectomy    . Esophagogastroduodenoscopy  02/14/2011    Procedure: ESOPHAGOGASTRODUODENOSCOPY (EGD);  Surgeon: Beryle Beams, MD;  Location: [facility] ENDOSCOPY;  Service: Endoscopy;  Laterality: N/A;    Family History  Problem Relation Age of Onset  . Anesthesia problems Neg Hx   . Hypotension Neg Hx   . Malignant hyperthermia Neg Hx   . Pseudochol deficiency Neg Hx   . Cancer Other    History  Substance Use Topics  . Smoking status: Passive Smoke Exposure - Never Smoker  . Smokeless tobacco: Not on file  . Alcohol Use: Yes   OB History    No data available     Review of Systems  Constitutional: Negative for fever.  Respiratory: Negative for shortness of breath.   Cardiovascular: Negative for chest pain.  Gastrointestinal: Positive for nausea, abdominal pain and diarrhea. Negative for vomiting.  Genitourinary: Negative for dysuria and decreased urine volume.  All other systems reviewed and are negative.     Allergies  Norvasc and Zithromax  Home Medications   Prior to Admission medications   Medication Sig Start Date End Date Taking? Authorizing Provider  albuterol (PROVENTIL HFA;VENTOLIN HFA) 108 (90 BASE) MCG/ACT inhaler Inhale 2 puffs into the lungs every 6 (six) hours as needed for wheezing or shortness of breath.   Yes Historical Provider, MD  budesonide-formoterol (SYMBICORT) 160-4.5 MCG/ACT inhaler Inhale 2 puffs into the lungs 2 (two) times daily.   Yes Historical Provider, MD  cetirizine (ZYRTEC) 10 MG tablet Take 10 mg by mouth daily.   Yes Historical Provider, MD  fluticasone (FLONASE) 50 MCG/ACT nasal spray Place 2 sprays into both nostrils daily as needed for allergies or  rhinitis.   Yes Historical Provider, MD  HYDROcodone-acetaminophen (NORCO/VICODIN) 5-325 MG per tablet Take 1 tablet by mouth every 6 (six) hours as needed for moderate pain.   Yes Historical Provider, MD  loperamide (IMODIUM) 2 MG capsule Take 2 capsules (4 mg total) by mouth once. 04/03/14   Junius Creamer, NP  ondansetron (ZOFRAN) 4 MG tablet Take 1 tablet (4 mg total) by mouth every 8 (eight) hours as needed for nausea or vomiting. Patient not taking: Reported on 04/03/2014 02/04/14   Melony Overly, MD   BP 121/71 mmHg  Pulse 89  Temp(Src) 98 F (36.7 C) (Oral)  Resp 19  Ht 5\' 5"  (1.651 m)  Wt 249 lb (112.946 kg)  BMI 41.44 kg/m2  SpO2 100%  LMP 04/02/2009 Physical Exam  Constitutional: She is oriented to person, place, and time. She appears well-developed and well-nourished.  Eyes: Pupils are equal, round, and reactive to light.  Neck: Normal range of motion.  Cardiovascular: Normal rate.   Pulmonary/Chest: Effort normal and breath sounds normal.  Abdominal: Soft. Bowel sounds are normal. She exhibits no distension. There is no tenderness.  Musculoskeletal: Normal range of motion.  Neurological: She is alert and oriented to person, place, and time.  Skin: Skin is warm and dry. No rash noted.  Nursing note and vitals reviewed.   ED Course  Procedures (including critical care time) Labs Review Labs Reviewed  CBC WITH DIFFERENTIAL/PLATELET - Abnormal; Notable for the following:    WBC 10.7 (*)    All other components within normal limits  COMPREHENSIVE METABOLIC PANEL - Abnormal; Notable for the following:    Glucose, Bld 138 (*)    GFR calc non Af Amer 76 (*)    GFR calc Af Amer 88 (*)    All other components within normal limits  LIPASE, BLOOD  POC OCCULT BLOOD, ED    Imaging Review Patient's labs are normal.  She is guaiac negative.  She has been given a prescription for Imodium and ejection to follow-up with her primary care physician  MDM   Final diagnoses:  Diarrhea         Junius Creamer, NP 04/03/14 2241  Evelina Bucy, MD 04/03/14 [phone]

## 2014-04-03 NOTE — ED Notes (Signed)
Pt placed in a gown hooked up to BP cuff and pulse ox

## 2014-04-03 NOTE — ED Notes (Signed)
Pt states she binge drank last Sat.  Since then she has been having diarrhea 6 x per day, bloody diarrhea 2 x per day and lower abdominal pain.

## 2014-04-03 NOTE — Discharge Instructions (Signed)
Your labs today are fine.  You are not bleeding rectally You have  been given a prescription for Imodium.  Please take this as directed.  Drink plenty of fluids.  Follow-up with Dr.

## 2014-04-22 DIAGNOSIS — M79651 Pain in right thigh: Secondary | ICD-10-CM | POA: Insufficient documentation

## 2014-08-11 ENCOUNTER — Emergency Department (HOSPITAL_COMMUNITY)
Admission: EM | Admit: 2014-08-11 | Discharge: 2014-08-11 | Disposition: A | Discharge status: Home / Self Care | Payer: Medicaid Other | Attending: Emergency Medicine | Admitting: Emergency Medicine

## 2014-08-11 ENCOUNTER — Emergency Department (HOSPITAL_COMMUNITY): Payer: Medicaid Other

## 2014-08-11 ENCOUNTER — Encounter (HOSPITAL_COMMUNITY): Payer: Self-pay | Admitting: Emergency Medicine

## 2014-08-11 DIAGNOSIS — Z8719 Personal history of other diseases of the digestive system: Secondary | ICD-10-CM | POA: Insufficient documentation

## 2014-08-11 DIAGNOSIS — D649 Anemia, unspecified: Secondary | ICD-10-CM | POA: Insufficient documentation

## 2014-08-11 DIAGNOSIS — R2243 Localized swelling, mass and lump, lower limb, bilateral: Secondary | ICD-10-CM | POA: Diagnosis present

## 2014-08-11 DIAGNOSIS — I10 Essential (primary) hypertension: Secondary | ICD-10-CM | POA: Insufficient documentation

## 2014-08-11 DIAGNOSIS — R600 Localized edema: Secondary | ICD-10-CM

## 2014-08-11 DIAGNOSIS — M1990 Unspecified osteoarthritis, unspecified site: Secondary | ICD-10-CM | POA: Insufficient documentation

## 2014-08-11 DIAGNOSIS — T426X5A Adverse effect of other antiepileptic and sedative-hypnotic drugs, initial encounter: Secondary | ICD-10-CM | POA: Insufficient documentation

## 2014-08-11 DIAGNOSIS — Z8659 Personal history of other mental and behavioral disorders: Secondary | ICD-10-CM | POA: Diagnosis not present

## 2014-08-11 DIAGNOSIS — Z7951 Long term (current) use of inhaled steroids: Secondary | ICD-10-CM | POA: Diagnosis not present

## 2014-08-11 DIAGNOSIS — Z79899 Other long term (current) drug therapy: Secondary | ICD-10-CM | POA: Insufficient documentation

## 2014-08-11 DIAGNOSIS — T50905A Adverse effect of unspecified drugs, medicaments and biological substances, initial encounter: Secondary | ICD-10-CM

## 2014-08-11 DIAGNOSIS — R0602 Shortness of breath: Secondary | ICD-10-CM | POA: Insufficient documentation

## 2014-08-11 LAB — CBC WITH DIFFERENTIAL/PLATELET
BASOS PCT: 0 % (ref 0–1)
Basophils Absolute: 0 10*3/uL (ref 0.0–0.1)
EOS ABS: 0.5 10*3/uL (ref 0.0–0.7)
EOS PCT: 5 % (ref 0–5)
HEMATOCRIT: 34.5 % — AB (ref 36.0–46.0)
Hemoglobin: 11.6 g/dL — ABNORMAL LOW (ref 12.0–15.0)
Lymphocytes Relative: 55 % — ABNORMAL HIGH (ref 12–46)
Lymphs Abs: 5.2 10*3/uL — ABNORMAL HIGH (ref 0.7–4.0)
MCH: 26.7 pg (ref 26.0–34.0)
MCHC: 33.6 g/dL (ref 30.0–36.0)
MCV: 79.3 fL (ref 78.0–100.0)
MONOS PCT: 6 % (ref 3–12)
Monocytes Absolute: 0.5 10*3/uL (ref 0.1–1.0)
Neutro Abs: 3.2 10*3/uL (ref 1.7–7.7)
Neutrophils Relative %: 34 % — ABNORMAL LOW (ref 43–77)
Platelets: 262 10*3/uL (ref 150–400)
RBC: 4.35 MIL/uL (ref 3.87–5.11)
RDW: 14.6 % (ref 11.5–15.5)
WBC: 9.4 10*3/uL (ref 4.0–10.5)

## 2014-08-11 LAB — COMPREHENSIVE METABOLIC PANEL
ALT: 34 U/L (ref 14–54)
AST: 27 U/L (ref 15–41)
Albumin: 3.4 g/dL — ABNORMAL LOW (ref 3.5–5.0)
Alkaline Phosphatase: 84 U/L (ref 38–126)
Anion gap: 8 (ref 5–15)
BUN: 7 mg/dL (ref 6–20)
CO2: 27 mmol/L (ref 22–32)
Calcium: 8.8 mg/dL — ABNORMAL LOW (ref 8.9–10.3)
Chloride: 104 mmol/L (ref 101–111)
Creatinine, Ser: 0.78 mg/dL (ref 0.44–1.00)
GFR calc Af Amer: >60 mL/min (ref >60)
GFR calc non Af Amer: >60 mL/min (ref >60)
Glucose, Bld: 98 mg/dL (ref 65–99)
Potassium: 3.6 mmol/L (ref 3.5–5.1)
Sodium: 139 mmol/L (ref 135–145)
Total Bilirubin: 0.3 mg/dL (ref 0.3–1.2)
Total Protein: 7.1 g/dL (ref 6.5–8.1)

## 2014-08-11 LAB — BRAIN NATRIURETIC PEPTIDE: B Natriuretic Peptide: 34.8 pg/mL (ref 0.0–100.0)

## 2014-08-11 NOTE — ED Notes (Signed)
Pt. reports swelling/edema at bilateral lower legs onset yesterday , denies injury . Mild SOB , ambulatory , pain " pressure" when ambulating .

## 2014-08-11 NOTE — ED Provider Notes (Signed)
CSN: [id number]     Arrival date & time 08/11/14  2016 History   First MD Initiated Contact with Patient 08/11/14 2150     Chief Complaint  Patient presents with  . Leg Swelling   HPI  Initially presented with bilateral lower extremity swelling. Denies history of injury. Also notes mild shortness of breath. Unable to lie flat due to significant back arthritis, but suspects she would have shortness of breath if attempted. Unsure if improved with elevation. Denies calf tenderness. Denies any prior history of similar episodes. Reports recently staring Lyrica three weeks ago and has been titrating up dose. States medication is ineffective. Concerned swelling is coming from Lyrica since that was listed as one of the side effects.   Past Medical History  Diagnosis Date  . Hypertension   . Pancreatitis   . Bipolar disorder   . Schizoaffective disorder   . GERD (gastroesophageal reflux disease)    Past Surgical History  Procedure Laterality Date  . Tubal ligation    . Appendectomy    . Esophagogastroduodenoscopy  02/14/2011    Procedure: ESOPHAGOGASTRODUODENOSCOPY (EGD);  Surgeon: Beryle Beams, MD;  Location: [facility] ENDOSCOPY;  Service: Endoscopy;  Laterality: N/A;   Family History  Problem Relation Age of Onset  . Anesthesia problems Neg Hx   . Hypotension Neg Hx   . Malignant hyperthermia Neg Hx   . Pseudochol deficiency Neg Hx   . Cancer Other    History  Substance Use Topics  . Smoking status: Passive Smoke Exposure - Never Smoker  . Smokeless tobacco: Not on file  . Alcohol Use: Yes   OB History    No data available     Review of Systems  Respiratory: Positive for shortness of breath.   Cardiovascular: Positive for leg swelling. Negative for chest pain.   Allergies  Norvasc and Zithromax  Home Medications   Prior to Admission medications   Medication Sig Start Date End Date Taking? Authorizing Provider  albuterol (PROVENTIL HFA;VENTOLIN HFA) 108 (90 BASE) MCG/ACT inhaler  Inhale 2 puffs into the lungs every 6 (six) hours as needed for wheezing or shortness of breath.    Historical Provider, MD  budesonide-formoterol (SYMBICORT) 160-4.5 MCG/ACT inhaler Inhale 2 puffs into the lungs 2 (two) times daily.    Historical Provider, MD  cetirizine (ZYRTEC) 10 MG tablet Take 10 mg by mouth daily.    Historical Provider, MD  fluticasone (FLONASE) 50 MCG/ACT nasal spray Place 2 sprays into both nostrils daily as needed for allergies or rhinitis.    Historical Provider, MD  HYDROcodone-acetaminophen (NORCO/VICODIN) 5-325 MG per tablet Take 1 tablet by mouth every 6 (six) hours as needed for moderate pain.    Historical Provider, MD  loperamide (IMODIUM) 2 MG capsule Take 2 capsules (4 mg total) by mouth once. 04/03/14   Junius Creamer, NP  ondansetron (ZOFRAN) 4 MG tablet Take 1 tablet (4 mg total) by mouth every 8 (eight) hours as needed for nausea or vomiting. Patient not taking: Reported on 04/03/2014 02/04/14   Melony Overly, MD   BP 125/76 mmHg  Pulse 70  Temp(Src) 97.7 F (36.5 C) (Oral)  Resp 19  Wt 249 lb (112.946 kg)  SpO2 100% Physical Exam  Constitutional: She is oriented to person, place, and time. She appears well-developed and well-nourished. No distress.  HENT:  Head: Normocephalic and atraumatic.  Cardiovascular: Normal rate and regular rhythm.  Exam reveals no gallop and no friction rub.   No murmur heard. Pulmonary/Chest:  Effort normal. No respiratory distress. She has no wheezes. She has no rales.  Abdominal: Soft. Bowel sounds are normal. She exhibits no distension. There is no tenderness. There is no rebound.  Musculoskeletal:  Nonpitting edema noted bilaterally in lower extremities  Neurological: She is alert and oriented to person, place, and time.  Skin: Skin is warm and dry. No rash noted.  Psychiatric: She has a normal mood and affect. Her behavior is normal.    ED Course  Procedures (including critical care time) Labs Review Labs Reviewed   CBC WITH DIFFERENTIAL/PLATELET - Abnormal; Notable for the following:    Hemoglobin 11.6 (*)    HCT 34.5 (*)    Neutrophils Relative % 34 (*)    Lymphocytes Relative 55 (*)    Lymphs Abs 5.2 (*)    All other components within normal limits  COMPREHENSIVE METABOLIC PANEL - Abnormal; Notable for the following:    Calcium 8.8 (*)    Albumin 3.4 (*)    All other components within normal limits  BRAIN NATRIURETIC PEPTIDE    Imaging Review Dg Chest Portable 1 View  08/11/2014   CLINICAL DATA:  Acute onset of shortness of breath and lower extremity swelling. Initial encounter.  EXAM: PORTABLE CHEST - 1 VIEW  COMPARISON:  Chest radiograph performed 02/04/2014  FINDINGS: The lungs are well-aerated. Mild vascular congestion is noted. There is no evidence of focal opacification, pleural effusion or pneumothorax.  The cardiomediastinal silhouette is borderline normal in size. No acute osseous abnormalities are seen.  IMPRESSION: Mild vascular congestion noted; lungs remain grossly clear.   Electronically Signed   By: Garald Balding M.D.   On: 08/11/2014 23:15     EKG Interpretation None      MDM   Final diagnoses:  Bilateral leg edema  Medication adverse effect, initial encounter  - CMP normal - CBC with mild anemia (11.6) - BNP normal - CXR with mild vascular congestion  Suspect edema secondary to Lyrica. Will titrate dose of lyrica down and then discontinue. Follow up with PCP. Recommend contacting Pain Clinic and alerting of adverse reaction.         [place], [place] 08/11/14 2329  Leonard Schwartz, MD 08/11/14 [phone]

## 2014-08-11 NOTE — Discharge Instructions (Signed)
I suspect your edema is due to Lyrica, which you just started taking approximately three weeks ago.  Please titrate the dose of this medication down to two tablets a day for seven days and then one tablet a day for seven days before stopping the medication. Please let the doctor at the pain clinic know that you had an adverse reaction to this medication. If your shortness of breath worsens or if you develop chest pain, please return to the Emergency Department.

## 2014-08-11 NOTE — ED Notes (Signed)
Patient getting X-ray

## 2014-09-10 DIAGNOSIS — Z8661 Personal history of infections of the central nervous system: Secondary | ICD-10-CM

## 2014-09-10 HISTORY — DX: Personal history of infections of the central nervous system: Z86.61

## 2014-09-21 ENCOUNTER — Encounter (HOSPITAL_COMMUNITY): Payer: Self-pay

## 2014-09-21 ENCOUNTER — Emergency Department (HOSPITAL_COMMUNITY): Payer: Medicaid Other

## 2014-09-21 ENCOUNTER — Encounter: Payer: Self-pay | Admitting: Internal Medicine

## 2014-09-21 ENCOUNTER — Encounter (HOSPITAL_COMMUNITY): Payer: Self-pay | Admitting: *Deleted

## 2014-09-21 ENCOUNTER — Inpatient Hospital Stay (HOSPITAL_COMMUNITY)
Admission: EM | Admit: 2014-09-21 | Discharge: 2014-09-24 | DRG: 097 | Disposition: A | Discharge status: Home / Self Care | Payer: Medicaid Other | Attending: Internal Medicine | Admitting: Internal Medicine

## 2014-09-21 DIAGNOSIS — E860 Dehydration: Secondary | ICD-10-CM | POA: Diagnosis present

## 2014-09-21 DIAGNOSIS — F259 Schizoaffective disorder, unspecified: Secondary | ICD-10-CM | POA: Diagnosis present

## 2014-09-21 DIAGNOSIS — E872 Acidosis, unspecified: Secondary | ICD-10-CM | POA: Insufficient documentation

## 2014-09-21 DIAGNOSIS — E669 Obesity, unspecified: Secondary | ICD-10-CM | POA: Diagnosis present

## 2014-09-21 DIAGNOSIS — G039 Meningitis, unspecified: Secondary | ICD-10-CM | POA: Diagnosis present

## 2014-09-21 DIAGNOSIS — Z6841 Body Mass Index (BMI) 40.0 and over, adult: Secondary | ICD-10-CM | POA: Diagnosis not present

## 2014-09-21 DIAGNOSIS — F319 Bipolar disorder, unspecified: Secondary | ICD-10-CM | POA: Diagnosis present

## 2014-09-21 DIAGNOSIS — F209 Schizophrenia, unspecified: Secondary | ICD-10-CM | POA: Diagnosis present

## 2014-09-21 DIAGNOSIS — Z23 Encounter for immunization: Secondary | ICD-10-CM | POA: Diagnosis not present

## 2014-09-21 DIAGNOSIS — F17210 Nicotine dependence, cigarettes, uncomplicated: Secondary | ICD-10-CM | POA: Diagnosis present

## 2014-09-21 DIAGNOSIS — D72829 Elevated white blood cell count, unspecified: Secondary | ICD-10-CM | POA: Diagnosis not present

## 2014-09-21 DIAGNOSIS — R51 Headache: Secondary | ICD-10-CM

## 2014-09-21 DIAGNOSIS — F3170 Bipolar disorder, currently in remission, most recent episode unspecified: Secondary | ICD-10-CM | POA: Diagnosis present

## 2014-09-21 DIAGNOSIS — A419 Sepsis, unspecified organism: Secondary | ICD-10-CM | POA: Diagnosis present

## 2014-09-21 DIAGNOSIS — K219 Gastro-esophageal reflux disease without esophagitis: Secondary | ICD-10-CM | POA: Diagnosis present

## 2014-09-21 DIAGNOSIS — IMO0001 Reserved for inherently not codable concepts without codable children: Secondary | ICD-10-CM | POA: Insufficient documentation

## 2014-09-21 DIAGNOSIS — H5442 Blindness, left eye, normal vision right eye: Secondary | ICD-10-CM | POA: Diagnosis present

## 2014-09-21 DIAGNOSIS — I10 Essential (primary) hypertension: Secondary | ICD-10-CM | POA: Diagnosis present

## 2014-09-21 DIAGNOSIS — R519 Headache, unspecified: Secondary | ICD-10-CM | POA: Insufficient documentation

## 2014-09-21 DIAGNOSIS — R509 Fever, unspecified: Secondary | ICD-10-CM | POA: Diagnosis present

## 2014-09-21 HISTORY — DX: Unspecified asthma, uncomplicated: J45.909

## 2014-09-21 HISTORY — DX: Unspecified osteoarthritis, unspecified site: M19.90

## 2014-09-21 HISTORY — DX: Low back pain: M54.5

## 2014-09-21 HISTORY — DX: Other chronic pain: G89.29

## 2014-09-21 HISTORY — DX: Fibromyalgia: M79.7

## 2014-09-21 HISTORY — DX: Other chronic pancreatitis: K86.1

## 2014-09-21 HISTORY — DX: Pure hypercholesterolemia, unspecified: E78.00

## 2014-09-21 HISTORY — DX: Low back pain, unspecified: M54.50

## 2014-09-21 HISTORY — DX: Blindness, one eye, unspecified eye: H54.40

## 2014-09-21 LAB — CSF CELL COUNT WITH DIFFERENTIAL
EOS CSF: 0 % (ref 0–1)
Eosinophils, CSF: 1 % (ref 0–1)
Lymphs, CSF: 95 % — ABNORMAL HIGH (ref 40–80)
Lymphs, CSF: 90 % — ABNORMAL HIGH (ref 40–80)
MONOCYTE-MACROPHAGE-SPINAL FLUID: 3 % — AB (ref 15–45)
Monocyte-Macrophage-Spinal Fluid: 7 % — ABNORMAL LOW (ref 15–45)
RBC Count, CSF: 380 /mm3 — ABNORMAL HIGH
RBC Count, CSF: 13 /mm3 — ABNORMAL HIGH
Segmented Neutrophils-CSF: 2 % (ref 0–6)
Segmented Neutrophils-CSF: 2 % (ref 0–6)
TUBE #: 4
Tube #: 1
WBC, CSF: 400 /mm3 (ref 0–5)
WBC, CSF: 580 /mm3 (ref 0–5)

## 2014-09-21 LAB — URINALYSIS, ROUTINE W REFLEX MICROSCOPIC
Bilirubin Urine: NEGATIVE
Glucose, UA: NEGATIVE mg/dL
Hgb urine dipstick: NEGATIVE
KETONES UR: NEGATIVE mg/dL
LEUKOCYTES UA: NEGATIVE
NITRITE: NEGATIVE
Protein, ur: NEGATIVE mg/dL
Specific Gravity, Urine: 1.009 (ref 1.005–1.030)
Urobilinogen, UA: 0.2 mg/dL (ref 0.0–1.0)
pH: 5 (ref 5.0–8.0)

## 2014-09-21 LAB — CBC WITH DIFFERENTIAL/PLATELET
BASOS ABS: 0 10*3/uL (ref 0.0–0.1)
BASOS PCT: 0 % (ref 0–1)
EOS ABS: 0 10*3/uL (ref 0.0–0.7)
Eosinophils Relative: 0 % (ref 0–5)
HCT: 38.5 % (ref 36.0–46.0)
HEMOGLOBIN: 12.8 g/dL (ref 12.0–15.0)
Lymphocytes Relative: 19 % (ref 12–46)
Lymphs Abs: 2.5 10*3/uL (ref 0.7–4.0)
MCH: 26.9 pg (ref 26.0–34.0)
MCHC: 33.2 g/dL (ref 30.0–36.0)
MCV: 81.1 fL (ref 78.0–100.0)
MONOS PCT: 4 % (ref 3–12)
Monocytes Absolute: 0.5 10*3/uL (ref 0.1–1.0)
NEUTROS PCT: 77 % (ref 43–77)
Neutro Abs: 9.8 10*3/uL — ABNORMAL HIGH (ref 1.7–7.7)
Platelets: 286 10*3/uL (ref 150–400)
RBC: 4.75 MIL/uL (ref 3.87–5.11)
RDW: 15 % (ref 11.5–15.5)
WBC: 12.8 10*3/uL — AB (ref 4.0–10.5)

## 2014-09-21 LAB — COMPREHENSIVE METABOLIC PANEL
ALBUMIN: 3.6 g/dL (ref 3.5–5.0)
ALK PHOS: 79 U/L (ref 38–126)
ALT: 17 U/L (ref 14–54)
ANION GAP: 10 (ref 5–15)
AST: 22 U/L (ref 15–41)
BUN: 6 mg/dL (ref 6–20)
CALCIUM: 9.1 mg/dL (ref 8.9–10.3)
CO2: 23 mmol/L (ref 22–32)
Chloride: 100 mmol/L — ABNORMAL LOW (ref 101–111)
Creatinine, Ser: 0.8 mg/dL (ref 0.44–1.00)
GFR calc Af Amer: >60 mL/min (ref >60)
GFR calc non Af Amer: >60 mL/min (ref >60)
GLUCOSE: 136 mg/dL — AB (ref 65–99)
Potassium: 3.9 mmol/L (ref 3.5–5.1)
SODIUM: 133 mmol/L — AB (ref 135–145)
Total Bilirubin: 0.5 mg/dL (ref 0.3–1.2)
Total Protein: 7.7 g/dL (ref 6.5–8.1)

## 2014-09-21 LAB — I-STAT CG4 LACTIC ACID, ED
Lactic Acid, Venous: 2.58 mmol/L (ref 0.5–2.0)
Lactic Acid, Venous: 3.27 mmol/L (ref 0.5–2.0)

## 2014-09-21 LAB — MAGNESIUM: MAGNESIUM: 1.8 mg/dL (ref 1.7–2.4)

## 2014-09-21 LAB — PROTEIN, CSF: TOTAL PROTEIN, CSF: 177 mg/dL — AB (ref 15–45)

## 2014-09-21 LAB — GLUCOSE, CSF: GLUCOSE CSF: 63 mg/dL (ref 40–70)

## 2014-09-21 MED ORDER — SODIUM CHLORIDE 0.9 % IV BOLUS (SEPSIS)
1000.0000 mL | Freq: Once | INTRAVENOUS | Status: AC
  Administered 2014-09-21: 1000 mL via INTRAVENOUS

## 2014-09-21 MED ORDER — LORATADINE 10 MG PO TABS
10.0000 mg | ORAL_TABLET | Freq: Every day | ORAL | Status: DC
  Administered 2014-09-22 – 2014-09-24 (×3): 10 mg via ORAL
  Filled 2014-09-21 (×4): qty 1

## 2014-09-21 MED ORDER — KETOROLAC TROMETHAMINE 30 MG/ML IJ SOLN
30.0000 mg | Freq: Four times a day (QID) | INTRAMUSCULAR | Status: DC | PRN
  Administered 2014-09-21 – 2014-09-24 (×4): 30 mg via INTRAVENOUS
  Filled 2014-09-21 (×4): qty 1

## 2014-09-21 MED ORDER — FOLIC ACID 1 MG PO TABS
1.0000 mg | ORAL_TABLET | Freq: Every day | ORAL | Status: DC
  Administered 2014-09-21 – 2014-09-24 (×4): 1 mg via ORAL
  Filled 2014-09-21 (×4): qty 1

## 2014-09-21 MED ORDER — SODIUM CHLORIDE 0.9 % IV SOLN
2.0000 g | INTRAVENOUS | Status: DC
  Administered 2014-09-21 – 2014-09-24 (×17): 2 g via INTRAVENOUS
  Filled 2014-09-21 (×21): qty 2000

## 2014-09-21 MED ORDER — IPRATROPIUM BROMIDE 0.02 % IN SOLN: 0.5000 mg | RESPIRATORY_TRACT | Status: DC | PRN

## 2014-09-21 MED ORDER — TIZANIDINE HCL 2 MG PO TABS
2.0000 mg | ORAL_TABLET | Freq: Every day | ORAL | Status: DC
  Administered 2014-09-21 – 2014-09-23 (×3): 2 mg via ORAL
  Filled 2014-09-21 (×4): qty 1

## 2014-09-21 MED ORDER — METOCLOPRAMIDE HCL 5 MG/ML IJ SOLN
10.0000 mg | Freq: Once | INTRAMUSCULAR | Status: AC
  Administered 2014-09-21: 10 mg via INTRAVENOUS
  Filled 2014-09-21: qty 2

## 2014-09-21 MED ORDER — MAGNESIUM CITRATE PO SOLN: 1.0000 | Freq: Once | ORAL | Status: DC | PRN

## 2014-09-21 MED ORDER — SODIUM CHLORIDE 0.9 % IJ SOLN
3.0000 mL | Freq: Two times a day (BID) | INTRAMUSCULAR | Status: DC
  Administered 2014-09-22 – 2014-09-23 (×2): 3 mL via INTRAVENOUS

## 2014-09-21 MED ORDER — OXYCODONE HCL 5 MG PO TABS
5.0000 mg | ORAL_TABLET | ORAL | Status: DC | PRN
  Administered 2014-09-21 – 2014-09-22 (×5): 5 mg via ORAL
  Filled 2014-09-21 (×5): qty 1

## 2014-09-21 MED ORDER — DEXTROSE 5 % IV SOLN
2.0000 g | Freq: Two times a day (BID) | INTRAVENOUS | Status: DC
  Administered 2014-09-21 – 2014-09-24 (×6): 2 g via INTRAVENOUS
  Filled 2014-09-21 (×7): qty 2

## 2014-09-21 MED ORDER — DOCUSATE SODIUM 100 MG PO CAPS
100.0000 mg | ORAL_CAPSULE | Freq: Two times a day (BID) | ORAL | Status: DC
  Administered 2014-09-21 – 2014-09-24 (×6): 100 mg via ORAL
  Filled 2014-09-21 (×6): qty 1

## 2014-09-21 MED ORDER — ALUM & MAG HYDROXIDE-SIMETH 200-200-20 MG/5ML PO SUSP: 30.0000 mL | Freq: Four times a day (QID) | ORAL | Status: DC | PRN

## 2014-09-21 MED ORDER — ONDANSETRON HCL 4 MG/2ML IJ SOLN: 4.0000 mg | Freq: Four times a day (QID) | INTRAMUSCULAR | Status: DC | PRN

## 2014-09-21 MED ORDER — SODIUM CHLORIDE 0.9 % IV SOLN
INTRAVENOUS | Status: DC
  Administered 2014-09-21: 16:00:00 via INTRAVENOUS
  Administered 2014-09-21: 125 mL/h via INTRAVENOUS
  Administered 2014-09-22 – 2014-09-24 (×5): via INTRAVENOUS

## 2014-09-21 MED ORDER — SENNOSIDES-DOCUSATE SODIUM 8.6-50 MG PO TABS: 1.0000 | ORAL_TABLET | Freq: Every evening | ORAL | Status: DC | PRN

## 2014-09-21 MED ORDER — ONDANSETRON HCL 4 MG PO TABS: 4.0000 mg | ORAL_TABLET | Freq: Four times a day (QID) | ORAL | Status: DC | PRN

## 2014-09-21 MED ORDER — PREGABALIN 25 MG PO CAPS
50.0000 mg | ORAL_CAPSULE | Freq: Two times a day (BID) | ORAL | Status: DC
  Administered 2014-09-21 – 2014-09-24 (×6): 50 mg via ORAL
  Filled 2014-09-21 (×6): qty 2

## 2014-09-21 MED ORDER — LIDOCAINE-EPINEPHRINE 1 %-1:100000 IJ SOLN
10.0000 mL | Freq: Once | INTRAMUSCULAR | Status: AC
  Administered 2014-09-21: 10 mL via INTRADERMAL
  Filled 2014-09-21: qty 1

## 2014-09-21 MED ORDER — SORBITOL 70 % SOLN
30.0000 mL | Freq: Every day | Status: DC | PRN
  Filled 2014-09-21: qty 30

## 2014-09-21 MED ORDER — PANTOPRAZOLE SODIUM 40 MG PO TBEC: 40.0000 mg | DELAYED_RELEASE_TABLET | Freq: Every day | ORAL | Status: DC

## 2014-09-21 MED ORDER — ALBUTEROL SULFATE (2.5 MG/3ML) 0.083% IN NEBU: 2.5000 mg | INHALATION_SOLUTION | RESPIRATORY_TRACT | Status: DC | PRN

## 2014-09-21 MED ORDER — VITAMIN B-1 100 MG PO TABS
100.0000 mg | ORAL_TABLET | Freq: Every day | ORAL | Status: DC
  Administered 2014-09-21 – 2014-09-24 (×4): 100 mg via ORAL
  Filled 2014-09-21 (×4): qty 1

## 2014-09-21 MED ORDER — PANTOPRAZOLE SODIUM 40 MG PO TBEC
40.0000 mg | DELAYED_RELEASE_TABLET | Freq: Every day | ORAL | Status: DC
  Administered 2014-09-21 – 2014-09-24 (×4): 40 mg via ORAL
  Filled 2014-09-21 (×4): qty 1

## 2014-09-21 MED ORDER — VANCOMYCIN HCL IN DEXTROSE 1-5 GM/200ML-% IV SOLN
1000.0000 mg | Freq: Three times a day (TID) | INTRAVENOUS | Status: DC
  Administered 2014-09-21 – 2014-09-24 (×8): 1000 mg via INTRAVENOUS
  Filled 2014-09-21 (×10): qty 200

## 2014-09-21 MED ORDER — ACETAMINOPHEN 325 MG PO TABS
650.0000 mg | ORAL_TABLET | Freq: Four times a day (QID) | ORAL | Status: DC | PRN
  Administered 2014-09-21: 650 mg via ORAL
  Filled 2014-09-21: qty 2

## 2014-09-21 MED ORDER — ADULT MULTIVITAMIN W/MINERALS CH
1.0000 | ORAL_TABLET | Freq: Every day | ORAL | Status: DC
  Administered 2014-09-21 – 2014-09-24 (×4): 1 via ORAL
  Filled 2014-09-21 (×4): qty 1

## 2014-09-21 MED ORDER — FLUTICASONE PROPIONATE 50 MCG/ACT NA SUSP
2.0000 | Freq: Every day | NASAL | Status: DC | PRN
  Filled 2014-09-21: qty 16

## 2014-09-21 MED ORDER — DEXAMETHASONE SODIUM PHOSPHATE 10 MG/ML IJ SOLN
10.0000 mg | Freq: Four times a day (QID) | INTRAMUSCULAR | Status: DC
  Administered 2014-09-21 – 2014-09-24 (×11): 10 mg via INTRAVENOUS
  Filled 2014-09-21 (×11): qty 1

## 2014-09-21 MED ORDER — ACETAMINOPHEN 650 MG RE SUPP: 650.0000 mg | Freq: Four times a day (QID) | RECTAL | Status: DC | PRN

## 2014-09-21 MED ORDER — ACETAMINOPHEN 325 MG PO TABS
650.0000 mg | ORAL_TABLET | Freq: Once | ORAL | Status: DC
Start: 2014-09-21 — End: 2014-09-21

## 2014-09-21 MED ORDER — BUDESONIDE-FORMOTEROL FUMARATE 160-4.5 MCG/ACT IN AERO
2.0000 | INHALATION_SPRAY | Freq: Two times a day (BID) | RESPIRATORY_TRACT | Status: DC
  Administered 2014-09-22 – 2014-09-24 (×4): 2 via RESPIRATORY_TRACT
  Filled 2014-09-21 (×2): qty 6

## 2014-09-21 MED ORDER — ONDANSETRON HCL 4 MG/2ML IJ SOLN
4.0000 mg | Freq: Once | INTRAMUSCULAR | Status: AC
  Administered 2014-09-21: 4 mg via INTRAVENOUS
  Filled 2014-09-21: qty 2

## 2014-09-21 MED ORDER — FENTANYL CITRATE (PF) 100 MCG/2ML IJ SOLN
50.0000 ug | Freq: Once | INTRAMUSCULAR | Status: AC
  Administered 2014-09-21: 50 ug via INTRAVENOUS
  Filled 2014-09-21: qty 2

## 2014-09-21 MED ORDER — DEXTROSE 5 % IV SOLN
2.0000 g | Freq: Once | INTRAVENOUS | Status: AC
  Administered 2014-09-21: 2 g via INTRAVENOUS
  Filled 2014-09-21: qty 2

## 2014-09-21 MED ORDER — SODIUM CHLORIDE 0.9 % IV SOLN
2500.0000 mg | Freq: Once | INTRAVENOUS | Status: AC
  Administered 2014-09-21: 2500 mg via INTRAVENOUS
  Filled 2014-09-21: qty 2500

## 2014-09-21 NOTE — Progress Notes (Signed)
NURSING PROGRESS NOTE  Holly Mendez [id number] Admission Data: 09/21/2014 6:11 PM Attending Provider: Eugenie Filler, MD MBE:[id number] NOT IN SYSTEM Code Status: Full Allergies:  Norvasc and Zithromax Past Medical History:   has a past medical history of Hypertension; Pancreatitis; Bipolar disorder; Schizoaffective disorder; and GERD (gastroesophageal reflux disease). Past Surgical History:   has past surgical history that includes Tubal ligation; Appendectomy; and Esophagogastroduodenoscopy (02/14/2011). Social History:   reports that she has been passively smoking Cigarettes.  She has been smoking about 0.25 packs per day. She does not have any smokeless tobacco history on file. She reports that she drinks alcohol. She reports that she does not use illicit drugs.  Holly Mendez is a 39 y.o. female patient admitted from ED:   Last Documented Vital Signs: Blood pressure 121/64, pulse 91, temperature 97.8 F (36.6 C), temperature source Oral, resp. rate 18, height 5\' 5"  (1.651 m), weight 114.306 kg (252 lb), SpO2 97 %.  Cardiac Monitoring: Box # 19 in place. Cardiac monitor yields:normal sinus rhythm.  IV Fluids:  IV in place, occlusive dsg intact without redness, IV cath antecubital left, condition patent and no redness normal saline.   Skin: WDL  Patient/Family orientated to room. Information packet given to patient/family. Admission inpatient armband information verified with patient/family to include name and date of birth and placed on patient arm. Side rails up x 2, fall assessment and education completed with patient/family. Patient/family able to verbalize understanding of risk associated with falls and verbalized understanding to call for assistance before getting out of bed. Call light within reach. Patient/family able to voice and demonstrate understanding of unit orientation instructions.    Will continue to evaluate and treat per MD orders.   Hendricks Limes RN,  BS, BSN

## 2014-09-21 NOTE — Progress Notes (Signed)
Report received by [facility] RN.

## 2014-09-21 NOTE — ED Notes (Signed)
Placed pt on bed pan.

## 2014-09-21 NOTE — ED Notes (Signed)
Attempted report 

## 2014-09-21 NOTE — ED Provider Notes (Signed)
CSN: [id number]     Arrival date & time 09/21/14  0856 History   None    Chief Complaint  Patient presents with  . Fever  . Headache   Patient is a 39 y.o. female presenting with general illness. The history is provided by the patient. No language interpreter was used.  Illness Location:  Head & neck Quality:  Pain, with fever & N/V Severity:  Severe Onset quality:  Gradual Timing:  Constant Progression:  Worsening Chronicity:  New Context:  PMHx of HTN, GERD, and schizoaffective fever, chills, headache, and neck stiffness. Generalized headache started yesterday morning with progressive worsening until presentation. Neck stiffness started yesterday evening along with subjective fever and shaking chills. Other associated symptoms include nausea, NBNB emesis, decreased appetite, and body aches. Associated symptoms: fatigue, fever, headaches, nausea and vomiting   Associated symptoms: no abdominal pain, no chest pain, no cough, no diarrhea, no rhinorrhea and no shortness of breath     Past Medical History  Diagnosis Date  . Hypertension   . Pancreatitis   . Bipolar disorder   . Schizoaffective disorder   . GERD (gastroesophageal reflux disease)    Past Surgical History  Procedure Laterality Date  . Tubal ligation    . Appendectomy    . Esophagogastroduodenoscopy  02/14/2011    Procedure: ESOPHAGOGASTRODUODENOSCOPY (EGD);  Surgeon: Beryle Beams, MD;  Location: [facility] ENDOSCOPY;  Service: Endoscopy;  Laterality: N/A;   Family History  Problem Relation Age of Onset  . Anesthesia problems Neg Hx   . Hypotension Neg Hx   . Malignant hyperthermia Neg Hx   . Pseudochol deficiency Neg Hx   . Cancer Other    Social History  Substance Use Topics  . Smoking status: Passive Smoke Exposure - Never Smoker  . Smokeless tobacco: None  . Alcohol Use: Yes   OB History    No data available      Review of Systems  Constitutional: Positive for fever, chills, appetite change and fatigue.   HENT: Negative for rhinorrhea.   Respiratory: Negative for cough and shortness of breath.   Cardiovascular: Negative for chest pain.  Gastrointestinal: Positive for nausea and vomiting. Negative for abdominal pain and diarrhea.  Musculoskeletal: Positive for neck pain and neck stiffness.  Neurological: Positive for headaches.  All other systems reviewed and are negative.   Allergies  Norvasc and Zithromax  Home Medications   Prior to Admission medications   Medication Sig Start Date End Date Taking? Authorizing Provider  albuterol (PROVENTIL HFA;VENTOLIN HFA) 108 (90 BASE) MCG/ACT inhaler Inhale 2 puffs into the lungs every 6 (six) hours as needed for wheezing or shortness of breath.    Historical Provider, MD  budesonide-formoterol (SYMBICORT) 160-4.5 MCG/ACT inhaler Inhale 2 puffs into the lungs 2 (two) times daily.    Historical Provider, MD  cetirizine (ZYRTEC) 10 MG tablet Take 10 mg by mouth daily.    Historical Provider, MD  fluticasone (FLONASE) 50 MCG/ACT nasal spray Place 2 sprays into both nostrils daily as needed for allergies or rhinitis.    Historical Provider, MD  HYDROcodone-acetaminophen (NORCO/VICODIN) 5-325 MG per tablet Take 1 tablet by mouth every 6 (six) hours as needed for moderate pain.    Historical Provider, MD  loperamide (IMODIUM) 2 MG capsule Take 2 capsules (4 mg total) by mouth once. 04/03/14   Junius Creamer, NP  ondansetron (ZOFRAN) 4 MG tablet Take 1 tablet (4 mg total) by mouth every 8 (eight) hours as needed for nausea or vomiting.  Patient not taking: Reported on 04/03/2014 02/04/14   Melony Overly, MD   BP 148/74 mmHg  Pulse 92  Temp(Src) 100.9 F (38.3 C) (Oral)  Resp 17  Ht 5\' 5"  (1.651 m)  Wt 252 lb (114.306 kg)  BMI 41.93 kg/m2  SpO2 99%   Physical Exam  Constitutional: She is oriented to person, place, and time. She appears distressed.  Middle-aged obese female in moderate distress  HENT:  Head: Normocephalic and atraumatic.  Eyes:  Conjunctivae are normal.  Right eye with pupil of 3 mm and reactive, left eye with ocular transplant  Neck: Normal range of motion. Neck supple.  Cardiovascular: Normal rate, regular rhythm and intact distal pulses.   Pulmonary/Chest: Effort normal and breath sounds normal.  Abdominal: Soft. Bowel sounds are normal. She exhibits no distension. There is no tenderness. There is no rebound.  Musculoskeletal: Normal range of motion.  Neurological: She is alert and oriented to person, place, and time. She has normal reflexes. She displays normal reflexes. No cranial nerve deficit. She exhibits normal muscle tone. Coordination normal.  Skin: Skin is warm. She is not diaphoretic.  Nursing note and vitals reviewed.   ED Course  Procedures   Labs Review Labs Reviewed  CBC WITH DIFFERENTIAL/PLATELET - Abnormal; Notable for the following:    WBC 12.8 (*)    Neutro Abs 9.8 (*)    All other components within normal limits  COMPREHENSIVE METABOLIC PANEL - Abnormal; Notable for the following:    Sodium 133 (*)    Chloride 100 (*)    Glucose, Bld 136 (*)    All other components within normal limits  PROTEIN, CSF - Abnormal; Notable for the following:    Total  Protein, CSF 177 (*)    All other components within normal limits  CSF CELL COUNT WITH DIFFERENTIAL - Abnormal; Notable for the following:    Appearance, CSF HAZY (*)    RBC Count, CSF 380 (*)    WBC, CSF 400 (*)    Lymphs, CSF 95 (*)    Monocyte-Macrophage-Spinal Fluid 3 (*)    All other components within normal limits  CSF CELL COUNT WITH DIFFERENTIAL - Abnormal; Notable for the following:    RBC Count, CSF 13 (*)    WBC, CSF 580 (*)    Lymphs, CSF 90 (*)    Monocyte-Macrophage-Spinal Fluid 7 (*)    All other components within normal limits  I-STAT CG4 LACTIC ACID, ED - Abnormal; Notable for the following:    Lactic Acid, Venous 2.58 (*)    All other components within normal limits  I-STAT CG4 LACTIC ACID, ED - Abnormal; Notable  for the following:    Lactic Acid, Venous 3.27 (*)    All other components within normal limits  CULTURE, BLOOD (ROUTINE X 2)  CULTURE, BLOOD (ROUTINE X 2)  CSF CULTURE  CSF CULTURE  GRAM STAIN  GRAM STAIN  CSF CULTURE  URINALYSIS, ROUTINE W REFLEX MICROSCOPIC (NOT AT [facility])  GLUCOSE, CSF  CSF CELL COUNT WITH DIFFERENTIAL  CSF CELL COUNT WITH DIFFERENTIAL  GLUCOSE, CSF  PROTEIN, CSF  GLUCOSE, CSF  PROTEIN, CSF  CSF CELL COUNT WITH DIFFERENTIAL  HERPES SIMPLEX VIRUS(HSV) DNA BY PCR   Imaging Review Dg Lumbar Puncture Fluoro Guide  09/21/2014   CLINICAL DATA:  Severe headache and neck pain since last evening.  EXAM: DIAGNOSTIC LUMBAR PUNCTURE UNDER FLUOROSCOPIC GUIDANCE  FLUOROSCOPY TIME:  Radiation Exposure Index (as provided by the fluoroscopic device):  If the device  does not provide the exposure index:  Fluoroscopy Time (in minutes and seconds):  1 minutes and 12 seconds  Number of Acquired Images:  1  PROCEDURE: Informed consent was obtained from the patient prior to the procedure, including potential complications of headache, allergy, and pain. With the patient prone, the lower back was prepped with Betadine. 1% Lidocaine was used for local anesthesia. Lumbar puncture was performed at the L3-4 level using a 20 gauge needle with return of clear CSF. 10 ml of CSF were obtained for laboratory studies. The patient tolerated the procedure well and there were no apparent complications.  IMPRESSION: Fluoroscopic guided lumbar puncture with 10 cc of clear CSF obtained for appropriate laboratory evaluation.   Electronically Signed   By: Marijo Sanes M.D.   On: 09/21/2014 12:33   I have personally reviewed and evaluated these images and lab results as part of my medical decision-making.   EKG Interpretation None      MDM  Ms Nemitz is a 39 yo female w/ PMHx of HTN, GERD, and schizoaffective fever, chills, HA, & neck stiffness. Generalized headache started yesterday morning with  progressive worsening until presentation. Neck stiffness started yesterday evening along with subjective fever and shaking chills. Other associated symptoms include nausea, NBNB emesis, decreased appetite, and body aches.  Exam above notable for obese middle-age female lying in stretcher in moderate distress. Temp 100.58F.  heart rate low 100s. Normotensive. Not tachypneic. Breathing well on room air and maintaining saturations without supplemental oxygen. Abdomen benign. Pupil 3 mm and reactive on right, left ocular implant in place. Neck rigidity noted on exam. Neuro exam otherwise nonfocal.   WBC 12.8 lactic acid 2.58. Concern for sepsis with possible source as meningitis. Attempted LP but unable to obtain CSF sample secondary to patient's body habitus. Blood & urine cultures obtains as well as basic labs. Cover for meningitis initiated with Ceftriaxone 2g & Vancomycin 20mg /kg. Patient sent to fluoroscopy suite to undergo LP - CSF containing obtained and sent for analysis. CMP unremarkable. UA negative. CSF analysis notable for WBCs in 500s, protein 177, and glucose 63 (serum glucose 163 which 70% would equal 114). Higher suspicion for bacterial source given results.  Patient admitted to hospitalist service for further evaluation and management of meningitis. Patient understands and agrees with plan has no further questions or concerns this time.  Patient care discussed with followed by my attending, Dr. Carmin Muskrat.   Final diagnoses:  Headache  Leukocytosis  Lactic acidosis  Meningitis    Mayer Camel, MD 09/21/14 1527  Carmin Muskrat, MD 09/24/14 1540

## 2014-09-21 NOTE — ED Notes (Signed)
Per ems- pt has reported HA, N/V, fever and neck pain for 24 hrs. Pt was given 4 mg of zofran, 1000mg  of tylenol, 256ml ns en route.

## 2014-09-21 NOTE — H&P (Signed)
[facility] History and Physical  Holly Mendez XQJ:[id number] DOB: 09-15-1975 DOA: 09/21/2014  Referring physician: Dr. Vanita Panda PCP: PROVIDER NOT IN SYSTEM   Chief Complaint: Headache/fever/nausea vomiting  HPI: Holly Mendez is a 39 y.o. female  With history of hypertension, bipolar disorder, schizoaffective disorder, gastroesophageal reflux disease, who presents to the ED with a one-day history of worsening frontal headache and headache on the top of the head which started at 10 AM while in church one day prior to admission. Patient states headache has been constant and worsening with some associated nausea emesis and pain and stiffness increased urinary output, photophobia, phonophobia, subjective fevers, chills. Patient denies any chest pain, no shortness of breath, no abdominal pain, no diarrhea, no constipation, no dysuria no melanotic, no hematemesis, no hematochezia, no cough. Patient states she called EMS and on route here she was noted to have a temperature 101. Patient was seen in the emergency room comprehensive metabolic profile obtained had a sodium of 133 chloride of 100 glucose of 136 otherwise was unremarkable. Lactic acid was elevated at 2.58 and it went further up to 3.27. CBC had a white count of 12.8 otherwise was within normal limits. Urinalysis was leukocytes negative nitrite negative. LP was attempted by ED physician due to body habitus unable to be obtained and patient underwent LP under fluoroscopy. Patient was given a dose of IV vancomycin and IV Rocephin. [facility] were called to admit the patient for further evaluation and management.   Review of Systems: As per history of present illness otherwise negative. Constitutional:  No weight loss, night sweats, Fevers, chills, fatigue.  HEENT:  No headaches, Difficulty swallowing,Tooth/dental problems,Sore throat,  No sneezing, itching, ear ache, nasal congestion, post nasal drip,    Cardio-vascular:  No chest pain, Orthopnea, PND, swelling in lower extremities, anasarca, dizziness, palpitations  GI:  No heartburn, indigestion, abdominal pain, nausea, vomiting, diarrhea, change in bowel habits, loss of appetite  Resp:  No shortness of breath with exertion or at rest. No excess mucus, no productive cough, No non-productive cough, No coughing up of blood.No change in color of mucus.No wheezing.No chest wall deformity  Skin:  no rash or lesions.  GU:  no dysuria, change in color of urine, no urgency or frequency. No flank pain.  Musculoskeletal:  No joint pain or swelling. No decreased range of motion. No back pain.  Psych:  No change in mood or affect. No depression or anxiety. No memory loss.   Past Medical History  Diagnosis Date  . Hypertension   . Pancreatitis   . Bipolar disorder   . Schizoaffective disorder   . GERD (gastroesophageal reflux disease)    Past Surgical History  Procedure Laterality Date  . Tubal ligation    . Appendectomy    . Esophagogastroduodenoscopy  02/14/2011    Procedure: ESOPHAGOGASTRODUODENOSCOPY (EGD);  Surgeon: Beryle Beams, MD;  Location[facility] ENDOSCOPY;  Service: Endoscopy;  Laterality: N/A;   Social History:  reports that she has been passively smoking Cigarettes.  She has been smoking about 0.25 packs per day. She does not have any smokeless tobacco history on file. She reports that she drinks alcohol. She reports that she does not use illicit drugs.  Allergies  Allergen Reactions  . Norvasc [Amlodipine Besylate] Other (See Comments)    Mouth irritation  . Zithromax [Azithromycin] Other (See Comments)    Severe stomach cramping    Family History  Problem Relation Age of Onset  . Anesthesia problems Neg Hx   .  Hypotension Neg Hx   . Malignant hyperthermia Neg Hx   . Pseudochol deficiency Neg Hx   . Cancer Other   . Aneurysm Mother    mother alive age 7 with brain aneurysms. Father deceased cause unknown. Patient is  on disability secondary to her bipolar and schizoaffective disorders.  Prior to Admission medications   Medication Sig Start Date End Date Taking? Authorizing Provider  albuterol (PROVENTIL HFA;VENTOLIN HFA) 108 (90 BASE) MCG/ACT inhaler Inhale 2 puffs into the lungs every 6 (six) hours as needed for wheezing or shortness of breath.   Yes Historical Provider, MD  budesonide-formoterol (SYMBICORT) 160-4.5 MCG/ACT inhaler Inhale 2 puffs into the lungs 2 (two) times daily.   Yes Historical Provider, MD  cetirizine (ZYRTEC) 10 MG tablet Take 10 mg by mouth daily.   Yes Historical Provider, MD  fluticasone (FLONASE) 50 MCG/ACT nasal spray Place 2 sprays into both nostrils daily as needed for allergies or rhinitis.   Yes Historical Provider, MD  pregabalin (LYRICA) 50 MG capsule Take 50 mg by mouth 2 (two) times daily.   Yes Historical Provider, MD  tizanidine (ZANAFLEX) 2 MG capsule Take 2 mg by mouth at bedtime.   Yes Historical Provider, MD  loperamide (IMODIUM) 2 MG capsule Take 2 capsules (4 mg total) by mouth once. Patient not taking: Reported on 09/21/2014 04/03/14   Junius Creamer, NP  ondansetron (ZOFRAN) 4 MG tablet Take 1 tablet (4 mg total) by mouth every 8 (eight) hours as needed for nausea or vomiting. Patient not taking: Reported on 04/03/2014 02/04/14   Melony Overly, MD   Physical Exam: Filed Vitals:   09/21/14 1427 09/21/14 1430 09/21/14 1445 09/21/14 1453  BP: 139/68 103/82 148/74   Pulse: 102 94 92   Temp:    100.9 F (38.3 C)  TempSrc:    Oral  Resp: 17     Height:      Weight:      SpO2: 100% 100% 99%     Wt Readings from Last 3 Encounters:  09/21/14 114.306 kg (252 lb)  08/11/14 112.946 kg (249 lb)  04/03/14 112.946 kg (249 lb)    General:  Well-developed well-nourished female laying on gurney in no acute cardiopulmonary distress. Patient complaining of significant headache. Patient lay flat post LP.  Eyes: PERRLA, EOMI, normal lids, irises & conjunctiva ENT: grossly  normal hearing, lips & tongue. Dry mucous membranes. Neck: no LAD, masses or thyromegaly. Nuchal rigidity Cardiovascular: Tachycardic. No murmurs rubs or gallops. No lower extremity edema.  Respiratory: CTA bilaterally anterior lung fields, no w/r/r. Normal respiratory effort. Abdomen: soft, ntnd, positive bowel sounds, no rebound, no guarding Skin: no rash or induration seen on limited exam Musculoskeletal: grossly normal tone BUE/BLE Psychiatric: grossly normal mood and affect, speech fluent and appropriate Neurologic: Alert and oriented 3. Cranial nerves II through XII are grossly intact. Sensation is intact. Visual fields are intact. Gait not tested secondary to safety.           Labs on Admission:  Basic Metabolic Panel:  Recent Labs Lab 09/21/14 1114  NA 133*  K 3.9  CL 100*  CO2 23  GLUCOSE 136*  BUN 6  CREATININE 0.80  CALCIUM 9.1   Liver Function Tests:  Recent Labs Lab 09/21/14 1114  AST 22  ALT 17  ALKPHOS 79  BILITOT 0.5  PROT 7.7  ALBUMIN 3.6   No results for input(s): LIPASE, AMYLASE in the last 168 hours. No results for input(s): AMMONIA in the  last 168 hours. CBC:  Recent Labs Lab 09/21/14 1114  WBC 12.8*  NEUTROABS 9.8*  HGB 12.8  HCT 38.5  MCV 81.1  PLT 286   Cardiac Enzymes: No results for input(s): CKTOTAL, CKMB, CKMBINDEX, TROPONINI in the last 168 hours.  BNP (last 3 results)  Recent Labs  08/11/14 2023  BNP 34.8    ProBNP (last 3 results) No results for input(s): PROBNP in the last 8760 hours.  CBG: No results for input(s): GLUCAP in the last 168 hours.  Radiological Exams on Admission: Dg Lumbar Puncture Fluoro Guide  09/21/2014   CLINICAL DATA:  Severe headache and neck pain since last evening.  EXAM: DIAGNOSTIC LUMBAR PUNCTURE UNDER FLUOROSCOPIC GUIDANCE  FLUOROSCOPY TIME:  Radiation Exposure Index (as provided by the fluoroscopic device):  If the device does not provide the exposure index:  Fluoroscopy Time (in  minutes and seconds):  1 minutes and 12 seconds  Number of Acquired Images:  1  PROCEDURE: Informed consent was obtained from the patient prior to the procedure, including potential complications of headache, allergy, and pain. With the patient prone, the lower back was prepped with Betadine. 1% Lidocaine was used for local anesthesia. Lumbar puncture was performed at the L3-4 level using a 20 gauge needle with return of clear CSF. 10 ml of CSF were obtained for laboratory studies. The patient tolerated the procedure well and there were no apparent complications.  IMPRESSION: Fluoroscopic guided lumbar puncture with 10 cc of clear CSF obtained for appropriate laboratory evaluation.   Electronically Signed   By: Marijo Sanes M.D.   On: 09/21/2014 12:33    EKG: Not  Assessment/Plan Principal Problem:   Meningitis Active Problems:   Schizophrenia   Bipolar affective disorder, currently in remission   HTN (hypertension)   Dehydration   Leukocytosis  #1 acute meningitis Likely bacterial versus viral. Patient presenting with fever chills severe headache, nuchal rigidity, chills, myalgias. Patient noted to have a leukocytosis with a white count of 12.8 and a fever with a temperature 100.9. Patient underwent LP under fluoroscopy per Interventional radiology. CSF with 13 RBCs 580 WBCs 90% lymphocytes 7% monocytes CSF protein was 177 CSF glucose was 63. CSF Gram stain and cultures pending. Blood cultures pending. Will check a HSV PCR of the CSF, enterovirus PCR of CSF. Will admit patient to telemetry. Will place patient on IV Rocephin, IV vancomycin, IV ampicillin, IV dexamethasone. IV fluids. Pain management. Supportive care. Consulted ID for further evaluation and management.  #2 leukocytosis Likely secondary to problem #1. UA negative. Patient with no pulmonary symptoms. Patient will be placed empirically on IV vancomycin, IV Rocephin, IV ampicillin. Follow.  #3 bipolar affective  disorder/schizophrenia Stable.  #4 dehydration IV fluids.  #5 prophylaxis PPI for GI prophylaxis. SCDs for DVT prophylaxis.  Code Status: Full DVT Prophylaxis: SCDs Family Communication: Updated patient. No family at bedside. Disposition Plan: Admit to telemetry.  Time spent: [facility] Pager [phone]

## 2014-09-21 NOTE — Progress Notes (Signed)
MD Grandville Silos paged, patient admitted to [facility].

## 2014-09-21 NOTE — Consult Note (Signed)
ANTIBIOTIC CONSULT NOTE - INITIAL  Pharmacy Consult for Vancomycin Indication: meningitis  Allergies  Allergen Reactions  . Norvasc [Amlodipine Besylate] Other (See Comments)    Mouth irritation  . Zithromax [Azithromycin] Other (See Comments)    Severe stomach cramping    Patient Measurements: Height: 5\' 5"  (165.1 cm) Weight: 252 lb (114.306 kg) IBW/kg (Calculated) : 57  Vital Signs: Temp: 100.9 F (38.3 C) (09/12 1453) Temp Source: Oral (09/12 1453) BP: 105/58 mmHg (09/12 1545) Pulse Rate: 93 (09/12 1545) Intake/Output from previous day:   Intake/Output from this shift:    Labs:  Recent Labs  09/21/14 1114  WBC 12.8*  HGB 12.8  PLT 286  CREATININE 0.80   Estimated Creatinine Clearance: 116.7 mL/min (by C-G formula based on Cr of 0.8).  Microbiology: Recent Results (from the past 720 hour(s))  Blood culture (routine x 2)     Status: None (Preliminary result)   Collection Time: 09/21/14 11:13 AM  Result Value Ref Range Status   Specimen Description BLOOD RIGHT ANTECUBITAL  Final   Special Requests BOTTLES DRAWN AEROBIC AND ANAEROBIC 5ML  Final   Culture PENDING  Incomplete   Report Status PENDING  Incomplete  Blood culture (routine x 2)     Status: None (Preliminary result)   Collection Time: 09/21/14 11:25 AM  Result Value Ref Range Status   Specimen Description BLOOD RIGHT HAND  Final   Special Requests BOTTLES DRAWN AEROBIC AND ANAEROBIC 5ML  Final   Culture PENDING  Incomplete   Report Status PENDING  Incomplete  CSF culture     Status: None (Preliminary result)   Collection Time: 09/21/14 12:32 PM  Result Value Ref Range Status   Specimen Description CSF  Final   Special Requests NONE  Final   Gram Stain   Final    CYTOSPIN SLIDE WBC PRESENT,BOTH PMN AND MONONUCLEAR NO ORGANISMS SEEN    Culture PENDING  Incomplete   Report Status PENDING  Incomplete    Medical History: Past Medical History  Diagnosis Date  . Hypertension   . Pancreatitis    . Bipolar disorder   . Schizoaffective disorder   . GERD (gastroesophageal reflux disease)    Assessment: 39yof presents to the ED with fever, chills, severe HA, nuchal rigidity, and myalgias. She underwent LP which showed protein 177, glucose 63, WBC 580, lymphs 90. She will begin antibiotics for probable meningitis. Received 2500mg  vancomycin in the ED at 1219. Renal function wnl.  Goal of Therapy:  Vancomycin trough level 15-20 mcg/ml  Plan:  1) Vancomycin 1g IV q8 2) Follow renal function, cultures, LOT, level as needed  Holly Mendez 09/21/2014,4:08 PM

## 2014-09-21 NOTE — Consult Note (Signed)
[facility]  Date of Admission:  09/21/2014  Date of Consult:  09/21/2014  Reason for Consult: Meningitis Referring Physician: Thompson  Impression/Recommendation Meningitis  Lymphocytic  Would- continue her current anbx as discussed with Dr Grandville Silos  await Cx  Check CSF HSV  Check CSF enterovirus  Check HIV AB and RNA  Check BCx   Check state enterovirus panel  Consider adding Acyclovir if any clinical change.   Thank you so much for this interesting consult,   Bobby Rumpf (pager) [phone] [email or web address]  Holly Mendez is an 39 y.o. female.  HPI: 39 yo F with hx of pancreatitis, comes to hospital with onset of headache yesterday AM. She denies fever or chills. She has no hx of insect bites (although she does spend her days sitting on her porch).  She was brought to ED today when her headache worsened. She was noted to have temp 101 in EMS.  She underwent LP in ED showing Prot 177, WBC 580 (90%L), RBC 13, Glc 63, g/s showed no organisms.   Past Medical History  Diagnosis Date  . Hypertension   . Pancreatitis   . Bipolar disorder   . Schizoaffective disorder   . GERD (gastroesophageal reflux disease)     Past Surgical History  Procedure Laterality Date  . Tubal ligation    . Appendectomy    . Esophagogastroduodenoscopy  02/14/2011    Procedure: ESOPHAGOGASTRODUODENOSCOPY (EGD);  Surgeon: Beryle Beams, MD;  Location: [facility] ENDOSCOPY;  Service: Endoscopy;  Laterality: N/A;     Allergies  Allergen Reactions  . Norvasc [Amlodipine Besylate] Other (See Comments)    Mouth irritation  . Zithromax [Azithromycin] Other (See Comments)    Severe stomach cramping    Medications:  Scheduled: . budesonide-formoterol  2 puff Inhalation BID  . dexamethasone  10 mg Intravenous 4 times per day  . loratadine  10 mg Oral Daily  . pantoprazole  40 mg Oral Q0600  . pregabalin  50 mg Oral BID  . tizanidine  2 mg Oral QHS     Abtx:  Anti-infectives    Start     Dose/Rate Route Frequency Ordered Stop   09/21/14 2200  cefTRIAXone (ROCEPHIN) 2 g in dextrose 5 % 50 mL IVPB     2 g 100 mL/hr over 30 Minutes Intravenous Every 12 hours 09/21/14 1551     09/21/14 2000  vancomycin (VANCOCIN) IVPB 1000 mg/200 mL premix     1,000 mg 200 mL/hr over 60 Minutes Intravenous Every 8 hours 09/21/14 1614     09/21/14 1630  ampicillin (OMNIPEN) 2 g in sodium chloride 0.9 % 50 mL IVPB     2 g 150 mL/hr over 20 Minutes Intravenous 6 times per day 09/21/14 1529     09/21/14 1130  vancomycin (VANCOCIN) 2,500 mg in sodium chloride 0.9 % 500 mL IVPB     2,500 mg 250 mL/hr over 120 Minutes Intravenous  Once 09/21/14 1056 09/21/14 1511   09/21/14 1056  cefTRIAXone (ROCEPHIN) 2 g in dextrose 5 % 50 mL IVPB     2 g 100 mL/hr over 30 Minutes Intravenous  Once 09/21/14 1056 09/21/14 1215      Total days of antibiotics: 0 amp/ceftriaxone/vanco           Social History:  reports that she has been passively smoking Cigarettes.  She has been smoking about 0.25 packs per day. She does not have any smokeless tobacco history on file. She reports  that she drinks alcohol. She reports that she does not use illicit drugs.  She is married.   Family History  Problem Relation Age of Onset  . Anesthesia problems Neg Hx   . Hypotension Neg Hx   . Malignant hyperthermia Neg Hx   . Pseudochol deficiency Neg Hx   . Cancer Other   . Aneurysm Mother     General ROS: +photophobia, no dysphagia, no genital or oral ulcers, +neck stiffness, no diarrhea, no rash, no dysuria.  Please see HPI. 12 point ROS o/w (-)  Blood pressure 111/50, pulse 99, temperature 100.9 F (38.3 C), temperature source Oral, resp. rate 17, height '5\' 5"'  (1.651 m), weight 114.306 kg (252 lb), SpO2 93 %. General appearance: alert, cooperative and mild distress Eyes: negative findings: conjunctivae and sclerae normal, pupils equal, round, reactive to light and accomodation  and mild light aversion.  Throat: normal findings: buccal mucosa normal and oropharynx pink & moist without lesions or evidence of thrush Neck: no adenopathy, supple, symmetrical, trachea midline and resistance to movement. lateral and flexion.  Lungs: clear to auscultation bilaterally Heart: regular rate and rhythm Abdomen: normal findings: bowel sounds normal and soft, non-tender Extremities: edema none Skin: no rash.   Normal light touch, plantar and dorsiflexion in BLE.    Results for orders placed or performed during the hospital encounter of 09/21/14 (from the past 48 hour(s))  Urinalysis, Routine w reflex microscopic (not at [facility])     Status: None   Collection Time: 09/21/14 11:01 AM  Result Value Ref Range   Color, Urine YELLOW YELLOW   APPearance CLEAR CLEAR   Specific Gravity, Urine 1.009 1.005 - 1.030   pH 5.0 5.0 - 8.0   Glucose, UA NEGATIVE NEGATIVE mg/dL   Hgb urine dipstick NEGATIVE NEGATIVE   Bilirubin Urine NEGATIVE NEGATIVE   Ketones, ur NEGATIVE NEGATIVE mg/dL   Protein, ur NEGATIVE NEGATIVE mg/dL   Urobilinogen, UA 0.2 0.0 - 1.0 mg/dL   Nitrite NEGATIVE NEGATIVE   Leukocytes, UA NEGATIVE NEGATIVE    Comment: MICROSCOPIC NOT DONE ON URINES WITH NEGATIVE PROTEIN, BLOOD, LEUKOCYTES, NITRITE, OR GLUCOSE <1000 mg/dL.  Blood culture (routine x 2)     Status: None (Preliminary result)   Collection Time: 09/21/14 11:13 AM  Result Value Ref Range   Specimen Description BLOOD RIGHT ANTECUBITAL    Special Requests BOTTLES DRAWN AEROBIC AND ANAEROBIC 5ML    Culture PENDING    Report Status PENDING   CBC with Differential     Status: Abnormal   Collection Time: 09/21/14 11:14 AM  Result Value Ref Range   WBC 12.8 (H) 4.0 - 10.5 K/uL   RBC 4.75 3.87 - 5.11 MIL/uL   Hemoglobin 12.8 12.0 - 15.0 g/dL   HCT 38.5 36.0 - 46.0 %   MCV 81.1 78.0 - 100.0 fL   MCH 26.9 26.0 - 34.0 pg   MCHC 33.2 30.0 - 36.0 g/dL   RDW 15.0 11.5 - 15.5 %   Platelets 286 150 - 400 K/uL    Neutrophils Relative % 77 43 - 77 %   Neutro Abs 9.8 (H) 1.7 - 7.7 K/uL   Lymphocytes Relative 19 12 - 46 %   Lymphs Abs 2.5 0.7 - 4.0 K/uL   Monocytes Relative 4 3 - 12 %   Monocytes Absolute 0.5 0.1 - 1.0 K/uL   Eosinophils Relative 0 0 - 5 %   Eosinophils Absolute 0.0 0.0 - 0.7 K/uL   Basophils Relative 0 0 - 1 %  Basophils Absolute 0.0 0.0 - 0.1 K/uL  Comprehensive metabolic panel     Status: Abnormal   Collection Time: 09/21/14 11:14 AM  Result Value Ref Range   Sodium 133 (L) 135 - 145 mmol/L   Potassium 3.9 3.5 - 5.1 mmol/L   Chloride 100 (L) 101 - 111 mmol/L   CO2 23 22 - 32 mmol/L   Glucose, Bld 136 (H) 65 - 99 mg/dL   BUN 6 6 - 20 mg/dL   Creatinine, Ser 0.80 0.44 - 1.00 mg/dL   Calcium 9.1 8.9 - 10.3 mg/dL   Total Protein 7.7 6.5 - 8.1 g/dL   Albumin 3.6 3.5 - 5.0 g/dL   AST 22 15 - 41 U/L   ALT 17 14 - 54 U/L   Alkaline Phosphatase 79 38 - 126 U/L   Total Bilirubin 0.5 0.3 - 1.2 mg/dL   GFR calc non Af Amer >60 >60 mL/min   GFR calc Af Amer >60 >60 mL/min    Comment: (NOTE) The eGFR has been calculated using the CKD EPI equation. This calculation has not been validated in all clinical situations. eGFR's persistently <60 mL/min signify possible Chronic Kidney Disease.    Anion gap 10 5 - 15  Blood culture (routine x 2)     Status: None (Preliminary result)   Collection Time: 09/21/14 11:25 AM  Result Value Ref Range   Specimen Description BLOOD RIGHT HAND    Special Requests BOTTLES DRAWN AEROBIC AND ANAEROBIC 5ML    Culture PENDING    Report Status PENDING   I-Stat CG4 Lactic Acid, ED     Status: Abnormal   Collection Time: 09/21/14 11:28 AM  Result Value Ref Range   Lactic Acid, Venous 2.58 (HH) 0.5 - 2.0 mmol/L   Comment NOTIFIED PHYSICIAN   Glucose, CSF     Status: None   Collection Time: 09/21/14 12:27 PM  Result Value Ref Range   Glucose, CSF 63 40 - 70 mg/dL  Protein, CSF     Status: Abnormal   Collection Time: 09/21/14 12:28 PM  Result Value  Ref Range   Total  Protein, CSF 177 (H) 15 - 45 mg/dL  CSF cell count with differential     Status: Abnormal   Collection Time: 09/21/14 12:29 PM  Result Value Ref Range   Tube # 1    Color, CSF COLORLESS COLORLESS   Appearance, CSF HAZY (A) CLEAR   Supernatant NOT INDICATED    RBC Count, CSF 380 (H) 0 /cu mm   WBC, CSF 400 (HH) 0 - 5 /cu mm    Comment: CRITICAL RESULT CALLED TO, READ BACK BY AND VERIFIED WITH: DREAMA WHITE,RN AT 1610 09/21/14 BY ZBEECH.    Segmented Neutrophils-CSF 2 0 - 6 %   Lymphs, CSF 95 (H) 40 - 80 %   Monocyte-Macrophage-Spinal Fluid 3 (L) 15 - 45 %   Eosinophils, CSF 0 0 - 1 %  CSF cell count with differential     Status: Abnormal   Collection Time: 09/21/14 12:30 PM  Result Value Ref Range   Tube # 4    Color, CSF COLORLESS COLORLESS   Appearance, CSF CLEAR CLEAR   Supernatant NOT INDICATED    RBC Count, CSF 13 (H) 0 /cu mm   WBC, CSF 580 (HH) 0 - 5 /cu mm    Comment: CRITICAL RESULT CALLED TO, READ BACK BY AND VERIFIED WITH: DREAMA WHITE,RN AT 9604 09/21/14 BY ZBEECH.    Segmented Neutrophils-CSF 2 0 -  6 %   Lymphs, CSF 90 (H) 40 - 80 %   Monocyte-Macrophage-Spinal Fluid 7 (L) 15 - 45 %   Eosinophils, CSF 1 0 - 1 %  CSF culture     Status: None (Preliminary result)   Collection Time: 09/21/14 12:32 PM  Result Value Ref Range   Specimen Description CSF    Special Requests NONE    Gram Stain      CYTOSPIN SLIDE WBC PRESENT,BOTH PMN AND MONONUCLEAR NO ORGANISMS SEEN    Culture PENDING    Report Status PENDING   I-Stat CG4 Lactic Acid, ED     Status: Abnormal   Collection Time: 09/21/14  1:09 PM  Result Value Ref Range   Lactic Acid, Venous 3.27 (HH) 0.5 - 2.0 mmol/L   Comment NOTIFIED PHYSICIAN       Component Value Date/Time   SDES CSF 09/21/2014 1232   SPECREQUEST NONE 09/21/2014 1232   CULT PENDING 09/21/2014 1232   REPTSTATUS PENDING 09/21/2014 1232   Dg Lumbar Puncture Fluoro Guide  09/21/2014   CLINICAL DATA:  Severe headache and  neck pain since last evening.  EXAM: DIAGNOSTIC LUMBAR PUNCTURE UNDER FLUOROSCOPIC GUIDANCE  FLUOROSCOPY TIME:  Radiation Exposure Index (as provided by the fluoroscopic device):  If the device does not provide the exposure index:  Fluoroscopy Time (in minutes and seconds):  1 minutes and 12 seconds  Number of Acquired Images:  1  PROCEDURE: Informed consent was obtained from the patient prior to the procedure, including potential complications of headache, allergy, and pain. With the patient prone, the lower back was prepped with Betadine. 1% Lidocaine was used for local anesthesia. Lumbar puncture was performed at the L3-4 level using a 20 gauge needle with return of clear CSF. 10 ml of CSF were obtained for laboratory studies. The patient tolerated the procedure well and there were no apparent complications.  IMPRESSION: Fluoroscopic guided lumbar puncture with 10 cc of clear CSF obtained for appropriate laboratory evaluation.   Electronically Signed   By: Marijo Sanes M.D.   On: 09/21/2014 12:33   Recent Results (from the past 240 hour(s))  Blood culture (routine x 2)     Status: None (Preliminary result)   Collection Time: 09/21/14 11:13 AM  Result Value Ref Range Status   Specimen Description BLOOD RIGHT ANTECUBITAL  Final   Special Requests BOTTLES DRAWN AEROBIC AND ANAEROBIC 5ML  Final   Culture PENDING  Incomplete   Report Status PENDING  Incomplete  Blood culture (routine x 2)     Status: None (Preliminary result)   Collection Time: 09/21/14 11:25 AM  Result Value Ref Range Status   Specimen Description BLOOD RIGHT HAND  Final   Special Requests BOTTLES DRAWN AEROBIC AND ANAEROBIC 5ML  Final   Culture PENDING  Incomplete   Report Status PENDING  Incomplete  CSF culture     Status: None (Preliminary result)   Collection Time: 09/21/14 12:32 PM  Result Value Ref Range Status   Specimen Description CSF  Final   Special Requests NONE  Final   Gram Stain   Final    CYTOSPIN SLIDE WBC  PRESENT,BOTH PMN AND MONONUCLEAR NO ORGANISMS SEEN    Culture PENDING  Incomplete   Report Status PENDING  Incomplete      09/21/2014, 4:33 PM     LOS: 0 days    Records and images were personally reviewed where available.

## 2014-09-22 ENCOUNTER — Encounter (HOSPITAL_COMMUNITY): Payer: Self-pay | Admitting: General Practice

## 2014-09-22 DIAGNOSIS — R51 Headache: Secondary | ICD-10-CM

## 2014-09-22 DIAGNOSIS — R519 Headache, unspecified: Secondary | ICD-10-CM | POA: Insufficient documentation

## 2014-09-22 DIAGNOSIS — IMO0001 Reserved for inherently not codable concepts without codable children: Secondary | ICD-10-CM | POA: Insufficient documentation

## 2014-09-22 DIAGNOSIS — A419 Sepsis, unspecified organism: Secondary | ICD-10-CM

## 2014-09-22 LAB — CBC
HEMATOCRIT: 35.6 % — AB (ref 36.0–46.0)
HEMOGLOBIN: 11.9 g/dL — AB (ref 12.0–15.0)
MCH: 26.7 pg (ref 26.0–34.0)
MCHC: 33.4 g/dL (ref 30.0–36.0)
MCV: 80 fL (ref 78.0–100.0)
Platelets: 231 10*3/uL (ref 150–400)
RBC: 4.45 MIL/uL (ref 3.87–5.11)
RDW: 15 % (ref 11.5–15.5)
WBC: 10.2 10*3/uL (ref 4.0–10.5)

## 2014-09-22 LAB — COMPREHENSIVE METABOLIC PANEL
ALBUMIN: 3.2 g/dL — AB (ref 3.5–5.0)
ALK PHOS: 63 U/L (ref 38–126)
ALT: 16 U/L (ref 14–54)
ANION GAP: 8 (ref 5–15)
AST: 22 U/L (ref 15–41)
BUN: 8 mg/dL (ref 6–20)
CHLORIDE: 105 mmol/L (ref 101–111)
CO2: 22 mmol/L (ref 22–32)
Calcium: 8.4 mg/dL — ABNORMAL LOW (ref 8.9–10.3)
Creatinine, Ser: 0.84 mg/dL (ref 0.44–1.00)
GFR calc Af Amer: >60 mL/min (ref >60)
GFR calc non Af Amer: >60 mL/min (ref >60)
GLUCOSE: 168 mg/dL — AB (ref 65–99)
POTASSIUM: 3.8 mmol/L (ref 3.5–5.1)
SODIUM: 135 mmol/L (ref 135–145)
Total Bilirubin: 0.5 mg/dL (ref 0.3–1.2)
Total Protein: 6.7 g/dL (ref 6.5–8.1)

## 2014-09-22 LAB — LACTIC ACID, PLASMA: LACTIC ACID, VENOUS: 2.3 mmol/L — AB (ref 0.5–2.0)

## 2014-09-22 LAB — PROTIME-INR
INR: 1.15 (ref 0.00–1.49)
Prothrombin Time: 14.9 seconds (ref 11.6–15.2)

## 2014-09-22 LAB — HIV ANTIBODY (ROUTINE TESTING W REFLEX): HIV Screen 4th Generation wRfx: NONREACTIVE

## 2014-09-22 LAB — PATHOLOGIST SMEAR REVIEW

## 2014-09-22 MED ORDER — INFLUENZA VAC SPLIT QUAD 0.5 ML IM SUSY
0.5000 mL | PREFILLED_SYRINGE | INTRAMUSCULAR | Status: DC
  Filled 2014-09-22: qty 0.5
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
## 2014-09-22 NOTE — Clinical Documentation Improvement (Signed)
Internal Medicine  Can the diagnosis of obesity be further specified? If so, please document in your future progress notes and discharge summary.     Morbid Obesity  Obesity only as currently documented  Other  Clinically Undetermined  Document any associated diagnoses/conditions.   Supporting Information: Currently documented height 5'5", weight 252 lb, BMI 41.93.     Please exercise your independent, professional judgment when responding. A specific answer is not anticipated or expected.  Thank you, Mateo Flow, RN [phone] Clinical Documentation Specialist

## 2014-09-22 NOTE — Progress Notes (Signed)
INFECTIOUS DISEASE PROGRESS NOTE  ID: Holly Mendez is a 39 y.o. female with  Principal Problem:   Meningitis Active Problems:   Schizophrenia   Bipolar affective disorder, currently in remission   HTN (hypertension)   Dehydration   Leukocytosis  Subjective: Feels better.  Improved headache Improved neck stiffness.   Abtx:  Anti-infectives    Start     Dose/Rate Route Frequency Ordered Stop   09/21/14 2200  cefTRIAXone (ROCEPHIN) 2 g in dextrose 5 % 50 mL IVPB     2 g 100 mL/hr over 30 Minutes Intravenous Every 12 hours 09/21/14 1551     09/21/14 2000  vancomycin (VANCOCIN) IVPB 1000 mg/200 mL premix     1,000 mg 200 mL/hr over 60 Minutes Intravenous Every 8 hours 09/21/14 1614     09/21/14 1630  ampicillin (OMNIPEN) 2 g in sodium chloride 0.9 % 50 mL IVPB     2 g 150 mL/hr over 20 Minutes Intravenous 6 times per day 09/21/14 1529     09/21/14 1130  vancomycin (VANCOCIN) 2,500 mg in sodium chloride 0.9 % 500 mL IVPB     2,500 mg 250 mL/hr over 120 Minutes Intravenous  Once 09/21/14 1056 09/21/14 1511   09/21/14 1056  cefTRIAXone (ROCEPHIN) 2 g in dextrose 5 % 50 mL IVPB     2 g 100 mL/hr over 30 Minutes Intravenous  Once 09/21/14 1056 09/21/14 1215      Medications:  Scheduled: . ampicillin (OMNIPEN) IV  2 g Intravenous 6 times per day  . budesonide-formoterol  2 puff Inhalation BID  . cefTRIAXone (ROCEPHIN)  IV  2 g Intravenous Q12H  . dexamethasone  10 mg Intravenous 4 times per day  . docusate sodium  100 mg Oral BID  . folic acid  1 mg Oral Daily  . [START ON 09/23/2014] Influenza vac split quadrivalent PF  0.5 mL Intramuscular Tomorrow-1000  . loratadine  10 mg Oral Daily  . multivitamin with minerals  1 tablet Oral Daily  . pantoprazole  40 mg Oral Q0600  . pregabalin  50 mg Oral BID  . sodium chloride  3 mL Intravenous Q12H  . thiamine  100 mg Oral Daily  . tiZANidine  2 mg Oral QHS  . vancomycin  1,000 mg Intravenous Q8H    Objective: Vital  signs in last 24 hours: Temp:  [97.8 F (36.6 C)-100.9 F (38.3 C)] 98.4 F (36.9 C) (09/13 0426) Pulse Rate:  [81-102] 83 (09/13 0426) Resp:  [17-23] 18 (09/13 0426) BP: (103-148)/(50-89) 113/64 mmHg (09/13 0426) SpO2:  [93 %-100 %] 96 % (09/13 0910) Weight:  [117.2 kg (258 lb 6.1 oz)] 117.2 kg (258 lb 6.1 oz) (09/13 0423)   General appearance: alert, cooperative and no distress Eyes: L eye opaque. no photophobia Neck: FROM Resp: clear to auscultation bilaterally Cardio: regular rate and rhythm GI: normal findings: bowel sounds normal and soft, non-tender  Lab Results  Recent Labs  09/21/14 1114 09/22/14 0518  WBC 12.8* 10.2  HGB 12.8 11.9*  HCT 38.5 35.6*  NA 133* 135  K 3.9 3.8  CL 100* 105  CO2 23 22  BUN 6 8  CREATININE 0.80 0.84   Liver Panel  Recent Labs  09/21/14 1114 09/22/14 0518  PROT 7.7 6.7  ALBUMIN 3.6 3.2*  AST 22 22  ALT 17 16  ALKPHOS 79 63  BILITOT 0.5 0.5   Sedimentation Rate No results for input(s): ESRSEDRATE in the last 72 hours. C-Reactive Protein  No results for input(s): CRP in the last 72 hours.  Microbiology: Recent Results (from the past 240 hour(s))  Blood culture (routine x 2)     Status: None (Preliminary result)   Collection Time: 09/21/14 11:13 AM  Result Value Ref Range Status   Specimen Description BLOOD RIGHT ANTECUBITAL  Final   Special Requests BOTTLES DRAWN AEROBIC AND ANAEROBIC 5ML  Final   Culture PENDING  Incomplete   Report Status PENDING  Incomplete  Blood culture (routine x 2)     Status: None (Preliminary result)   Collection Time: 09/21/14 11:25 AM  Result Value Ref Range Status   Specimen Description BLOOD RIGHT HAND  Final   Special Requests BOTTLES DRAWN AEROBIC AND ANAEROBIC 5ML  Final   Culture PENDING  Incomplete   Report Status PENDING  Incomplete  CSF culture     Status: None (Preliminary result)   Collection Time: 09/21/14 12:32 PM  Result Value Ref Range Status   Specimen Description CSF   Final   Special Requests NONE  Final   Gram Stain   Final    CYTOSPIN SLIDE WBC PRESENT,BOTH PMN AND MONONUCLEAR NO ORGANISMS SEEN    Culture PENDING  Incomplete   Report Status PENDING  Incomplete    Studies/Results: Dg Lumbar Puncture Fluoro Guide  09/21/2014   CLINICAL DATA:  Severe headache and neck pain since last evening.  EXAM: DIAGNOSTIC LUMBAR PUNCTURE UNDER FLUOROSCOPIC GUIDANCE  FLUOROSCOPY TIME:  Radiation Exposure Index (as provided by the fluoroscopic device):  If the device does not provide the exposure index:  Fluoroscopy Time (in minutes and seconds):  1 minutes and 12 seconds  Number of Acquired Images:  1  PROCEDURE: Informed consent was obtained from the patient prior to the procedure, including potential complications of headache, allergy, and pain. With the patient prone, the lower back was prepped with Betadine. 1% Lidocaine was used for local anesthesia. Lumbar puncture was performed at the L3-4 level using a 20 gauge needle with return of clear CSF. 10 ml of CSF were obtained for laboratory studies. The patient tolerated the procedure well and there were no apparent complications.  IMPRESSION: Fluoroscopic guided lumbar puncture with 10 cc of clear CSF obtained for appropriate laboratory evaluation.   Electronically Signed   By: Marijo Sanes M.D.   On: 09/21/2014 12:33     Assessment/Plan: Meningitis  Total days of antibiotics: 2 amp/vanco/ceftriaxone/steroids  HIV Ab (-) Await csf cx Await hsv and adeno pcr Await state arbovirus panel- spoke with lab to get setup         Bobby Rumpf Infectious Diseases (pager) [phone] [email or web address] 09/22/2014, 10:43 AM  LOS: 1 day

## 2014-09-22 NOTE — Progress Notes (Signed)
[facility]                                                                              Patient Demographics  Holly Mendez, is a 39 y.o. female, DOB - January 21, 1975, MVH:[id number]  Admit date - 09/21/2014   Admitting Physician Eugenie Filler, MD  Outpatient Primary MD for the patient is [name], MD  LOS - 1   Chief Complaint  Patient presents with  . Fever  . Headache      Interim history 39 year old with history of hypertension, bipolar, schizoaffective disorder presenting to the emergency department with complaints of frontal headache, fever nausea and vomiting. Patient underwent LP. CSF culture shows no growth. Blood cultures pending. Infectious disease also consulted. Continue IV vancomycin, ampicillin, ceftriaxone.   Assessment & Plan   Sepsis secondary to Acute meningitis -Patient was febrile upon admission with leukocytosis -Leukocytosis improved -Continues to have headache however improved, able to move her neck without restriction -Continue IV vancomycin, ampicillin, ceftriaxone, dexamethasone -Patient underwent fluoroscopy guided LP by interventional radiology, CSF culture shows no organisms -Blood Cultures pending -Pending HSV, enterovirus PCR   Bipolar affective disorder/schizophrenia -Stable, currently on no home medications  Dehydration -Improving, Placed on IV fluids  GERD -Continue PPI  Left eye blindness -Stable  Code Status: Full  Family Communication: None at Bedside  Disposition Plan: Admitted.  Time Spent in minutes   30 minutes  Procedures  LP   Consults   Infectious disease  DVT Prophylaxis  SCDs  Lab Results  Component Value Date   PLT 231 09/22/2014    Medications  Scheduled Meds: . ampicillin (OMNIPEN) IV  2 g Intravenous 6 times per day  . budesonide-formoterol  2 puff Inhalation BID  . cefTRIAXone (ROCEPHIN)  IV  2 g Intravenous Q12H  . dexamethasone  10 mg Intravenous 4 times per day  . docusate sodium   100 mg Oral BID  . folic acid  1 mg Oral Daily  . [START ON 09/23/2014] Influenza vac split quadrivalent PF  0.5 mL Intramuscular Tomorrow-1000  . loratadine  10 mg Oral Daily  . multivitamin with minerals  1 tablet Oral Daily  . pantoprazole  40 mg Oral Q0600  . pregabalin  50 mg Oral BID  . sodium chloride  3 mL Intravenous Q12H  . thiamine  100 mg Oral Daily  . tiZANidine  2 mg Oral QHS  . vancomycin  1,000 mg Intravenous Q8H   Continuous Infusions: . sodium chloride 125 mL/hr at 09/22/14 0856   PRN Meds:.acetaminophen **OR** acetaminophen, albuterol, alum & mag hydroxide-simeth, fluticasone, ipratropium, ketorolac, magnesium citrate, ondansetron **OR** ondansetron (ZOFRAN) IV, oxyCODONE, senna-docusate, sorbitol  Antibiotics    Anti-infectives    Start     Dose/Rate Route Frequency Ordered Stop   09/21/14 2200  cefTRIAXone (ROCEPHIN) 2 g in dextrose 5 % 50 mL IVPB     2 g 100 mL/hr over 30 Minutes Intravenous Every 12 hours 09/21/14 1551     09/21/14 2000  vancomycin (VANCOCIN) IVPB 1000 mg/200 mL premix     1,000 mg 200 mL/hr over 60 Minutes Intravenous Every 8 hours 09/21/14 1614     09/21/14 1630  ampicillin (  OMNIPEN) 2 g in sodium chloride 0.9 % 50 mL IVPB     2 g 150 mL/hr over 20 Minutes Intravenous 6 times per day 09/21/14 1529     09/21/14 1130  vancomycin (VANCOCIN) 2,500 mg in sodium chloride 0.9 % 500 mL IVPB     2,500 mg 250 mL/hr over 120 Minutes Intravenous  Once 09/21/14 1056 09/21/14 1511   09/21/14 1056  cefTRIAXone (ROCEPHIN) 2 g in dextrose 5 % 50 mL IVPB     2 g 100 mL/hr over 30 Minutes Intravenous  Once 09/21/14 1056 09/21/14 1215        Subjective:   [employer] seen and examined today.  Patient states her headache has improved.  Patient states she is able to coordinate with more ease. Denies any testing, shortness of breath, vomiting, nausea vomiting diarrhea constipation, dizziness.  Objective:   Filed Vitals:   09/21/14 2215 09/22/14  0423 09/22/14 0426 09/22/14 0910  BP: 133/62  113/64   Pulse: 81  83   Temp: 98.1 F (36.7 C)  98.4 F (36.9 C)   TempSrc: Oral  Oral   Resp: 20  18   Height:      Weight:  117.2 kg (258 lb 6.1 oz)    SpO2: 99%  94% 96%    Wt Readings from Last 3 Encounters:  09/22/14 117.2 kg (258 lb 6.1 oz)  08/11/14 112.946 kg (249 lb)  04/03/14 112.946 kg (249 lb)     Intake/Output Summary (Last 24 hours) at 09/22/14 1135 Last data filed at 09/22/14 0956  Gross per 24 hour  Intake 3035.83 ml  Output    550 ml  Net 2485.83 ml    Exam  General: Well developed, well nourished, NAD, appears stated age  HEENT: NCAT, mucous membranes moist. Left eye opaque  Cardiovascular: S1 S2 auscultated, no rubs, murmurs or gallops. Regular rate and rhythm.  Respiratory: Clear to auscultation bilaterally with equal chest rise  Abdomen: Soft, obese, nontender, nondistended, + bowel sounds  Extremities: warm dry without cyanosis clubbing or edema  Neuro: AAOx3, nonfocal (left eye blindness)  Skin: Without rashes exudates or nodules  Psych: Normal affect and demeanor  Data Review   Micro Results Recent Results (from the past 240 hour(s))  Blood culture (routine x 2)     Status: None (Preliminary result)   Collection Time: 09/21/14 11:13 AM  Result Value Ref Range Status   Specimen Description BLOOD RIGHT ANTECUBITAL  Final   Special Requests BOTTLES DRAWN AEROBIC AND ANAEROBIC 5ML  Final   Culture PENDING  Incomplete   Report Status PENDING  Incomplete  Blood culture (routine x 2)     Status: None (Preliminary result)   Collection Time: 09/21/14 11:25 AM  Result Value Ref Range Status   Specimen Description BLOOD RIGHT HAND  Final   Special Requests BOTTLES DRAWN AEROBIC AND ANAEROBIC 5ML  Final   Culture PENDING  Incomplete   Report Status PENDING  Incomplete  CSF culture     Status: None (Preliminary result)   Collection Time: 09/21/14 12:32 PM  Result Value Ref Range Status    Specimen Description CSF  Final   Special Requests NONE  Final   Gram Stain   Final    CYTOSPIN SLIDE WBC PRESENT,BOTH PMN AND MONONUCLEAR NO ORGANISMS SEEN    Culture PENDING  Incomplete   Report Status PENDING  Incomplete    Radiology Reports Dg Lumbar Puncture Fluoro Guide  09/21/2014   CLINICAL DATA:  Severe headache and neck pain since last evening.  EXAM: DIAGNOSTIC LUMBAR PUNCTURE UNDER FLUOROSCOPIC GUIDANCE  FLUOROSCOPY TIME:  Radiation Exposure Index (as provided by the fluoroscopic device):  If the device does not provide the exposure index:  Fluoroscopy Time (in minutes and seconds):  1 minutes and 12 seconds  Number of Acquired Images:  1  PROCEDURE: Informed consent was obtained from the patient prior to the procedure, including potential complications of headache, allergy, and pain. With the patient prone, the lower back was prepped with Betadine. 1% Lidocaine was used for local anesthesia. Lumbar puncture was performed at the L3-4 level using a 20 gauge needle with return of clear CSF. 10 ml of CSF were obtained for laboratory studies. The patient tolerated the procedure well and there were no apparent complications.  IMPRESSION: Fluoroscopic guided lumbar puncture with 10 cc of clear CSF obtained for appropriate laboratory evaluation.   Electronically Signed   By: Marijo Sanes M.D.   On: 09/21/2014 12:33    CBC  Recent Labs Lab 09/21/14 1114 09/22/14 0518  WBC 12.8* 10.2  HGB 12.8 11.9*  HCT 38.5 35.6*  PLT 286 231  MCV 81.1 80.0  MCH 26.9 26.7  MCHC 33.2 33.4  RDW 15.0 15.0  LYMPHSABS 2.5  --   MONOABS 0.5  --   EOSABS 0.0  --   BASOSABS 0.0  --     Chemistries   Recent Labs Lab 09/21/14 1114 09/21/14 1847 09/22/14 0518  NA 133*  --  135  K 3.9  --  3.8  CL 100*  --  105  CO2 23  --  22  GLUCOSE 136*  --  168*  BUN 6  --  8  CREATININE 0.80  --  0.84  CALCIUM 9.1  --  8.4*  MG  --  1.8  --   AST 22  --  22  ALT 17  --  16  ALKPHOS 79  --  63    BILITOT 0.5  --  0.5   ------------------------------------------------------------------------------------------------------------------ estimated creatinine clearance is 114.8 mL/min (by C-G formula based on Cr of 0.84). ------------------------------------------------------------------------------------------------------------------ No results for input(s): HGBA1C in the last 72 hours. ------------------------------------------------------------------------------------------------------------------ No results for input(s): CHOL, HDL, LDLCALC, TRIG, CHOLHDL, LDLDIRECT in the last 72 hours. ------------------------------------------------------------------------------------------------------------------ No results for input(s): TSH, T4TOTAL, T3FREE, THYROIDAB in the last 72 hours.  Invalid input(s): FREET3 ------------------------------------------------------------------------------------------------------------------ No results for input(s): VITAMINB12, FOLATE, FERRITIN, TIBC, IRON, RETICCTPCT in the last 72 hours.  Coagulation profile  Recent Labs Lab 09/22/14 0518  INR 1.15    No results for input(s): DDIMER in the last 72 hours.  Cardiac Enzymes No results for input(s): CKMB, TROPONINI, MYOGLOBIN in the last 168 hours.  Invalid input(s): CK ------------------------------------------------------------------------------------------------------------------ Invalid input(s): POCBNP    Dave Mergen D.O. on 09/22/2014 at 11:35 AM  Between 7am to 7pm - Pager - [phone]  After 7pm go to [email or web address] - password [email or web address]  And look for the night coverage person covering for me after hours  [facility] Office  [phone]

## 2014-09-22 NOTE — Progress Notes (Signed)
Cont pt on Droplet precautions even with no growth on CSF per Dr. Lujean Rave you.

## 2014-09-22 NOTE — Progress Notes (Signed)
PT Cancellation Note  Patient Details Name: Holly Mendez MRN: [id number] DOB: [birth year]   Cancelled Treatment:    Reason Eval/Treat Not Completed: PT screened, no needs identified, will sign off. Per nursing pt independent.   Elanah Osmanovic 09/22/2014, 12:44 PM  [facility] PT [phone]

## 2014-09-22 NOTE — Progress Notes (Signed)
OT Cancellation Note  Patient Details Name: Holly Mendez MRN: [id number] DOB: 1975-09-08   Cancelled Treatment:    Reason Eval/Treat Not Completed: OT screened, no needs identified, will sign off  Benito Mccreedy OTR/L [phone] 09/22/2014, 5:09 PM

## 2014-09-23 ENCOUNTER — Encounter (HOSPITAL_COMMUNITY): Payer: Self-pay

## 2014-09-23 ENCOUNTER — Ambulatory Visit: Payer: Medicaid Other | Admitting: Family Medicine

## 2014-09-23 DIAGNOSIS — F3170 Bipolar disorder, currently in remission, most recent episode unspecified: Secondary | ICD-10-CM

## 2014-09-23 DIAGNOSIS — I10 Essential (primary) hypertension: Secondary | ICD-10-CM

## 2014-09-23 DIAGNOSIS — E860 Dehydration: Secondary | ICD-10-CM

## 2014-09-23 DIAGNOSIS — D72829 Elevated white blood cell count, unspecified: Secondary | ICD-10-CM

## 2014-09-23 LAB — BASIC METABOLIC PANEL
Anion gap: 9 (ref 5–15)
BUN: 11 mg/dL (ref 6–20)
CO2: 26 mmol/L (ref 22–32)
CREATININE: 0.76 mg/dL (ref 0.44–1.00)
Calcium: 8.8 mg/dL — ABNORMAL LOW (ref 8.9–10.3)
Chloride: 103 mmol/L (ref 101–111)
GFR calc Af Amer: >60 mL/min (ref >60)
GLUCOSE: 160 mg/dL — AB (ref 65–99)
POTASSIUM: 4.3 mmol/L (ref 3.5–5.1)
SODIUM: 138 mmol/L (ref 135–145)

## 2014-09-23 LAB — CBC
HCT: 38.2 % (ref 36.0–46.0)
Hemoglobin: 12.7 g/dL (ref 12.0–15.0)
MCH: 27 pg (ref 26.0–34.0)
MCHC: 33.2 g/dL (ref 30.0–36.0)
MCV: 81.1 fL (ref 78.0–100.0)
PLATELETS: 265 10*3/uL (ref 150–400)
RBC: 4.71 MIL/uL (ref 3.87–5.11)
RDW: 15.2 % (ref 11.5–15.5)
WBC: 13.2 10*3/uL — ABNORMAL HIGH (ref 4.0–10.5)

## 2014-09-23 LAB — HIV-1 RNA ULTRAQUANT REFLEX TO GENTYP+: HIV-1 RNA Quant, Log: UNDETERMINED log10copy/mL

## 2014-09-23 MED ORDER — POLYETHYLENE GLYCOL 3350 17 G PO PACK
17.0000 g | PACK | Freq: Every day | ORAL | Status: DC
  Administered 2014-09-23 – 2014-09-24 (×2): 17 g via ORAL
  Filled 2014-09-23 (×2): qty 1

## 2014-09-23 MED ORDER — INFLUENZA VAC SPLIT QUAD 0.5 ML IM SUSY
0.5000 mL | PREFILLED_SYRINGE | INTRAMUSCULAR | Status: AC
  Administered 2014-09-24: 0.5 mL via INTRAMUSCULAR
  Filled 2014-09-23: qty 0.5

## 2014-09-23 NOTE — Progress Notes (Signed)
[facility] PROGRESS NOTE  Holly Mendez RSW:[id number] DOB: [birth year] DOA: 09/21/2014 PCP: [name], MD  Assessment/Plan: Sepsis secondary to Acute meningitis -Patient was febrile upon admission with leukocytosis -Leukocytosis improved -Pt continued to have headache however improved, able to move her neck without restriction -Pt is continued on IV vancomycin, ampicillin, ceftriaxone, dexamethasone -Patient underwent fluoroscopy guided LP by interventional radiology, CSF culture shows no organisms -Blood Cultures pending -Pending HSV, enterovirus PCR - called lab/micro. Labs sent out, arrived for testing 9/13, pending  Bipolar affective disorder/schizophrenia -Stable, currently on no home medications  Dehydration -Improving, Placed on IV fluids  GERD -Continue PPI  Left eye blindness -Remain stable  Code Status: Full Family Communication: Pt in room Disposition Plan: Pending   Consultants:  ID  Procedures:    Antibiotics:  Ampicillin 9/12>>>  Rocephin 9/12>>>  Vancomycin 9/12>>>  HPI/Subjective: Complains of continued headache. Neck is less stiff  Objective: Filed Vitals:   09/23/14 0639 09/23/14 0900 09/23/14 0906 09/23/14 1505  BP:    142/85  Pulse:   87 74  Temp:    97.9 F (36.6 C)  TempSrc:    Oral  Resp:   18 22  Height:      Weight: 115.667 kg (255 lb)     SpO2:  99% 99% 99%    Intake/Output Summary (Last 24 hours) at 09/23/14 1706 Last data filed at 09/23/14 0910  Gross per 24 hour  Intake 4160.83 ml  Output    800 ml  Net 3360.83 ml   Filed Weights   09/21/14 0902 09/22/14 0423 09/23/14 0639  Weight: 114.306 kg (252 lb) 117.2 kg (258 lb 6.1 oz) 115.667 kg (255 lb)    Exam:   General:  Awake, in nad  Cardiovascular: regular, s1, s2  Respiratory: normal resp effort, no wheezing  Abdomen: soft,nondistended  Musculoskeletal: perfused, no clubbing   Data Reviewed: Basic Metabolic Panel:  Recent Labs Lab  09/21/14 1114 09/21/14 1847 09/22/14 0518 09/23/14 0530  NA 133*  --  135 138  K 3.9  --  3.8 4.3  CL 100*  --  105 103  CO2 23  --  22 26  GLUCOSE 136*  --  168* 160*  BUN 6  --  8 11  CREATININE 0.80  --  0.84 0.76  CALCIUM 9.1  --  8.4* 8.8*  MG  --  1.8  --   --    Liver Function Tests:  Recent Labs Lab 09/21/14 1114 09/22/14 0518  AST 22 22  ALT 17 16  ALKPHOS 79 63  BILITOT 0.5 0.5  PROT 7.7 6.7  ALBUMIN 3.6 3.2*   No results for input(s): LIPASE, AMYLASE in the last 168 hours. No results for input(s): AMMONIA in the last 168 hours. CBC:  Recent Labs Lab 09/21/14 1114 09/22/14 0518 09/23/14 0530  WBC 12.8* 10.2 13.2*  NEUTROABS 9.8*  --   --   HGB 12.8 11.9* 12.7  HCT 38.5 35.6* 38.2  MCV 81.1 80.0 81.1  PLT 286 231 265   Cardiac Enzymes: No results for input(s): CKTOTAL, CKMB, CKMBINDEX, TROPONINI in the last 168 hours. BNP (last 3 results)  Recent Labs  08/11/14 2023  BNP 34.8    ProBNP (last 3 results) No results for input(s): PROBNP in the last 8760 hours.  CBG: No results for input(s): GLUCAP in the last 168 hours.  Recent Results (from the past 240 hour(s))  Blood culture (routine x 2)     Status: None (  Preliminary result)   Collection Time: 09/21/14 11:13 AM  Result Value Ref Range Status   Specimen Description BLOOD RIGHT ANTECUBITAL  Final   Special Requests BOTTLES DRAWN AEROBIC AND ANAEROBIC 5ML  Final   Culture NO GROWTH 1 DAY  Final   Report Status PENDING  Incomplete  Blood culture (routine x 2)     Status: None (Preliminary result)   Collection Time: 09/21/14 11:25 AM  Result Value Ref Range Status   Specimen Description BLOOD RIGHT HAND  Final   Special Requests BOTTLES DRAWN AEROBIC AND ANAEROBIC 5ML  Final   Culture NO GROWTH 1 DAY  Final   Report Status PENDING  Incomplete  CSF culture     Status: None (Preliminary result)   Collection Time: 09/21/14 12:32 PM  Result Value Ref Range Status   Specimen Description  CSF  Final   Special Requests NONE  Final   Gram Stain   Final    CYTOSPIN SLIDE WBC PRESENT,BOTH PMN AND MONONUCLEAR NO ORGANISMS SEEN    Culture NO GROWTH 2 DAYS  Final   Report Status PENDING  Incomplete     Studies: No results found.  Scheduled Meds: . ampicillin (OMNIPEN) IV  2 g Intravenous 6 times per day  . budesonide-formoterol  2 puff Inhalation BID  . cefTRIAXone (ROCEPHIN)  IV  2 g Intravenous Q12H  . dexamethasone  10 mg Intravenous 4 times per day  . docusate sodium  100 mg Oral BID  . folic acid  1 mg Oral Daily  . [START ON 09/24/2014] Influenza vac split quadrivalent PF  0.5 mL Intramuscular Tomorrow-1000  . loratadine  10 mg Oral Daily  . multivitamin with minerals  1 tablet Oral Daily  . pantoprazole  40 mg Oral Q0600  . polyethylene glycol  17 g Oral Daily  . pregabalin  50 mg Oral BID  . sodium chloride  3 mL Intravenous Q12H  . thiamine  100 mg Oral Daily  . tiZANidine  2 mg Oral QHS  . vancomycin  1,000 mg Intravenous Q8H   Continuous Infusions: . sodium chloride 125 mL/hr at 09/23/14 1441    Principal Problem:   Meningitis Active Problems:   Schizophrenia   Bipolar affective disorder, currently in remission   HTN (hypertension)   Dehydration   Leukocytosis   Headache   Blood poisoning   Holly Mendez, [place] [facility] Pager [phone]. If 7PM-7AM, please contact night-coverage at [email or web address], password [facility] 09/23/2014, 5:06 PM  LOS: 2 days

## 2014-09-23 NOTE — Consult Note (Signed)
Holly Mendez M. [name] EdD 

## 2014-09-23 NOTE — Progress Notes (Addendum)
INFECTIOUS DISEASE PROGRESS NOTE  ID: Holly Mendez is a 39 y.o. female with  Principal Problem:   Meningitis Active Problems:   Schizophrenia   Bipolar affective disorder, currently in remission   HTN (hypertension)   Dehydration   Leukocytosis   Headache   Blood poisoning  Subjective: Without complaints. Headache better with IV rx.   Neck feels better.   Abtx:  Anti-infectives    Start     Dose/Rate Route Frequency Ordered Stop   09/21/14 2200  cefTRIAXone (ROCEPHIN) 2 g in dextrose 5 % 50 mL IVPB     2 g 100 mL/hr over 30 Minutes Intravenous Every 12 hours 09/21/14 1551     09/21/14 2000  vancomycin (VANCOCIN) IVPB 1000 mg/200 mL premix     1,000 mg 200 mL/hr over 60 Minutes Intravenous Every 8 hours 09/21/14 1614     09/21/14 1630  ampicillin (OMNIPEN) 2 g in sodium chloride 0.9 % 50 mL IVPB     2 g 150 mL/hr over 20 Minutes Intravenous 6 times per day 09/21/14 1529     09/21/14 1130  vancomycin (VANCOCIN) 2,500 mg in sodium chloride 0.9 % 500 mL IVPB     2,500 mg 250 mL/hr over 120 Minutes Intravenous  Once 09/21/14 1056 09/21/14 1511   09/21/14 1056  cefTRIAXone (ROCEPHIN) 2 g in dextrose 5 % 50 mL IVPB     2 g 100 mL/hr over 30 Minutes Intravenous  Once 09/21/14 1056 09/21/14 1215      Medications:  Scheduled: . ampicillin (OMNIPEN) IV  2 g Intravenous 6 times per day  . budesonide-formoterol  2 puff Inhalation BID  . cefTRIAXone (ROCEPHIN)  IV  2 g Intravenous Q12H  . dexamethasone  10 mg Intravenous 4 times per day  . docusate sodium  100 mg Oral BID  . folic acid  1 mg Oral Daily  . [START ON 09/24/2014] Influenza vac split quadrivalent PF  0.5 mL Intramuscular Tomorrow-1000  . loratadine  10 mg Oral Daily  . multivitamin with minerals  1 tablet Oral Daily  . pantoprazole  40 mg Oral Q0600  . polyethylene glycol  17 g Oral Daily  . pregabalin  50 mg Oral BID  . sodium chloride  3 mL Intravenous Q12H  . thiamine  100 mg Oral Daily  .  tiZANidine  2 mg Oral QHS  . vancomycin  1,000 mg Intravenous Q8H    Objective: Vital signs in last 24 hours: Temp:  [97.5 F (36.4 C)-98.2 F (36.8 C)] 97.9 F (36.6 C) (09/14 1505) Pulse Rate:  [74-88] 74 (09/14 1505) Resp:  [16-22] 22 (09/14 1505) BP: (121-142)/(55-85) 142/85 mmHg (09/14 1505) SpO2:  [99 %-100 %] 99 % (09/14 1505) Weight:  [115.667 kg (255 lb)] 115.667 kg (255 lb) (09/14 0639)   General appearance: alert, cooperative and no distress Neck: FROM Resp: clear to auscultation bilaterally Cardio: regular rate and rhythm GI: normal findings: bowel sounds normal and soft, non-tender  Lab Results  Recent Labs  09/22/14 0518 09/23/14 0530  WBC 10.2 13.2*  HGB 11.9* 12.7  HCT 35.6* 38.2  NA 135 138  K 3.8 4.3  CL 105 103  CO2 22 26  BUN 8 11  CREATININE 0.84 0.76   Liver Panel  Recent Labs  09/21/14 1114 09/22/14 0518  PROT 7.7 6.7  ALBUMIN 3.6 3.2*  AST 22 22  ALT 17 16  ALKPHOS 79 63  BILITOT 0.5 0.5   Sedimentation Rate No results  for input(s): ESRSEDRATE in the last 72 hours. C-Reactive Protein No results for input(s): CRP in the last 72 hours.  Microbiology: Recent Results (from the past 240 hour(s))  Blood culture (routine x 2)     Status: None (Preliminary result)   Collection Time: 09/21/14 11:13 AM  Result Value Ref Range Status   Specimen Description BLOOD RIGHT ANTECUBITAL  Final   Special Requests BOTTLES DRAWN AEROBIC AND ANAEROBIC 5ML  Final   Culture NO GROWTH 1 DAY  Final   Report Status PENDING  Incomplete  Blood culture (routine x 2)     Status: None (Preliminary result)   Collection Time: 09/21/14 11:25 AM  Result Value Ref Range Status   Specimen Description BLOOD RIGHT HAND  Final   Special Requests BOTTLES DRAWN AEROBIC AND ANAEROBIC 5ML  Final   Culture NO GROWTH 1 DAY  Final   Report Status PENDING  Incomplete  CSF culture     Status: None (Preliminary result)   Collection Time: 09/21/14 12:32 PM  Result Value  Ref Range Status   Specimen Description CSF  Final   Special Requests NONE  Final   Gram Stain   Final    CYTOSPIN SLIDE WBC PRESENT,BOTH PMN AND MONONUCLEAR NO ORGANISMS SEEN    Culture NO GROWTH 2 DAYS  Final   Report Status PENDING  Incomplete    Studies/Results: No results found.   Assessment/Plan: Meningitis  Total days of antibiotics: 3 amp/vanco/ceftriaxone/steroids  Hopefully her Cx will be negative in AM and she can be d/c off anbx Her HSV pcr is the only test that would change her treatment, she can receive valtrex po if needed.  D/i pt and Dr Wyline Copas  Therapeutic drug monitoring  Cr stable  F/u vanco level.       HIV (-)     Bobby Rumpf Infectious Diseases (pager) [phone] [email or web address] 09/23/2014, 6:06 PM  LOS: 2 days

## 2014-09-24 ENCOUNTER — Telehealth: Payer: Self-pay | Admitting: Family Medicine

## 2014-09-24 ENCOUNTER — Inpatient Hospital Stay (HOSPITAL_COMMUNITY): Payer: Medicaid Other

## 2014-09-24 LAB — BASIC METABOLIC PANEL
Anion gap: 10 (ref 5–15)
BUN: 10 mg/dL (ref 6–20)
CHLORIDE: 101 mmol/L (ref 101–111)
CO2: 27 mmol/L (ref 22–32)
CREATININE: 0.82 mg/dL (ref 0.44–1.00)
Calcium: 8.7 mg/dL — ABNORMAL LOW (ref 8.9–10.3)
GFR calc non Af Amer: >60 mL/min (ref >60)
Glucose, Bld: 183 mg/dL — ABNORMAL HIGH (ref 65–99)
POTASSIUM: 3.9 mmol/L (ref 3.5–5.1)
SODIUM: 138 mmol/L (ref 135–145)

## 2014-09-24 LAB — CBC
HEMATOCRIT: 40.4 % (ref 36.0–46.0)
Hemoglobin: 13.3 g/dL (ref 12.0–15.0)
MCH: 26.8 pg (ref 26.0–34.0)
MCHC: 32.9 g/dL (ref 30.0–36.0)
MCV: 81.3 fL (ref 78.0–100.0)
Platelets: 319 10*3/uL (ref 150–400)
RBC: 4.97 MIL/uL (ref 3.87–5.11)
RDW: 15.1 % (ref 11.5–15.5)
WBC: 12.7 10*3/uL — AB (ref 4.0–10.5)

## 2014-09-24 LAB — CSF CULTURE: CULTURE: NO GROWTH

## 2014-09-24 MED ORDER — KETOROLAC TROMETHAMINE 10 MG PO TABS: 10.0000 mg | ORAL_TABLET | Freq: Four times a day (QID) | ORAL | Status: DC | PRN

## 2014-09-24 NOTE — Telephone Encounter (Signed)
Pt is being discharged from the hospital today. She had a new pt appt with dr Nori Riis on Monday but she was in the hospital.  appt was rescheduled to 10-15.  She needs a hospital followup visit before that day. Would dr neal want to see her before then, schedule her with another dr on white team or just let her be seen on the 15.   If another appt is scheduled, please call pt and advise

## 2014-09-24 NOTE — Progress Notes (Signed)
INFECTIOUS DISEASE PROGRESS NOTE  ID: Holly Mendez is a 39 y.o. female with  Principal Problem:   Meningitis Active Problems:   Schizophrenia   Bipolar affective disorder, currently in remission   HTN (hypertension)   Dehydration   Leukocytosis   Headache   Blood poisoning  Subjective: No headache  Abtx:  Anti-infectives    Start     Dose/Rate Route Frequency Ordered Stop   09/21/14 2200  cefTRIAXone (ROCEPHIN) 2 g in dextrose 5 % 50 mL IVPB     2 g 100 mL/hr over 30 Minutes Intravenous Every 12 hours 09/21/14 1551     09/21/14 2000  vancomycin (VANCOCIN) IVPB 1000 mg/200 mL premix     1,000 mg 200 mL/hr over 60 Minutes Intravenous Every 8 hours 09/21/14 1614     09/21/14 1630  ampicillin (OMNIPEN) 2 g in sodium chloride 0.9 % 50 mL IVPB     2 g 150 mL/hr over 20 Minutes Intravenous 6 times per day 09/21/14 1529     09/21/14 1130  vancomycin (VANCOCIN) 2,500 mg in sodium chloride 0.9 % 500 mL IVPB     2,500 mg 250 mL/hr over 120 Minutes Intravenous  Once 09/21/14 1056 09/21/14 1511   09/21/14 1056  cefTRIAXone (ROCEPHIN) 2 g in dextrose 5 % 50 mL IVPB     2 g 100 mL/hr over 30 Minutes Intravenous  Once 09/21/14 1056 09/21/14 1215      Medications:  Scheduled: . ampicillin (OMNIPEN) IV  2 g Intravenous 6 times per day  . budesonide-formoterol  2 puff Inhalation BID  . cefTRIAXone (ROCEPHIN)  IV  2 g Intravenous Q12H  . dexamethasone  10 mg Intravenous 4 times per day  . docusate sodium  100 mg Oral BID  . folic acid  1 mg Oral Daily  . Influenza vac split quadrivalent PF  0.5 mL Intramuscular Tomorrow-1000  . loratadine  10 mg Oral Daily  . multivitamin with minerals  1 tablet Oral Daily  . pantoprazole  40 mg Oral Q0600  . polyethylene glycol  17 g Oral Daily  . pregabalin  50 mg Oral BID  . sodium chloride  3 mL Intravenous Q12H  . thiamine  100 mg Oral Daily  . tiZANidine  2 mg Oral QHS  . vancomycin  1,000 mg Intravenous Q8H    Objective: Vital  signs in last 24 hours: Temp:  [97.5 F (36.4 C)-97.9 F (36.6 C)] 97.7 F (36.5 C) (09/15 0602) Pulse Rate:  [64-89] 64 (09/15 0835) Resp:  [18-22] 18 (09/15 0835) BP: (109-153)/(69-91) 109/69 mmHg (09/15 0602) SpO2:  [98 %-99 %] 98 % (09/15 0835) Weight:  [116.8 kg (257 lb 8 oz)] 116.8 kg (257 lb 8 oz) (09/15 0602)   General appearance: alert, cooperative and no distress Resp: clear to auscultation bilaterally Cardio: regular rate and rhythm GI: normal findings: bowel sounds normal and soft, non-tender Neck- FROM  Lab Results  Recent Labs  09/23/14 0530 09/24/14 0619  WBC 13.2* 12.7*  HGB 12.7 13.3  HCT 38.2 40.4  NA 138 138  K 4.3 3.9  CL 103 101  CO2 26 27  BUN 11 10  CREATININE 0.76 0.82   Liver Panel  Recent Labs  09/22/14 0518  PROT 6.7  ALBUMIN 3.2*  AST 22  ALT 16  ALKPHOS 63  BILITOT 0.5   Sedimentation Rate No results for input(s): ESRSEDRATE in the last 72 hours. C-Reactive Protein No results for input(s): CRP in the last  72 hours.  Microbiology: Recent Results (from the past 240 hour(s))  Blood culture (routine x 2)     Status: None (Preliminary result)   Collection Time: 09/21/14 11:13 AM  Result Value Ref Range Status   Specimen Description BLOOD RIGHT ANTECUBITAL  Final   Special Requests BOTTLES DRAWN AEROBIC AND ANAEROBIC 5ML  Final   Culture NO GROWTH 2 DAYS  Final   Report Status PENDING  Incomplete  Blood culture (routine x 2)     Status: None (Preliminary result)   Collection Time: 09/21/14 11:25 AM  Result Value Ref Range Status   Specimen Description BLOOD RIGHT HAND  Final   Special Requests BOTTLES DRAWN AEROBIC AND ANAEROBIC 5ML  Final   Culture NO GROWTH 2 DAYS  Final   Report Status PENDING  Incomplete  CSF culture     Status: None   Collection Time: 09/21/14 12:32 PM  Result Value Ref Range Status   Specimen Description CSF  Final   Special Requests NONE  Final   Gram Stain   Final    CYTOSPIN SLIDE WBC  PRESENT,BOTH PMN AND MONONUCLEAR NO ORGANISMS SEEN    Culture NO GROWTH 3 DAYS  Final   Report Status 09/24/2014 FINAL  Final    Studies/Results: No results found.   Assessment/Plan: Meningitis  Total days of antibiotics: 4 amp/vanco/ceftriaxone/steroids  Will stop her anbx Stop her steroids.  Obtain head CT Await PCR's Can be d/c home with outpt f/u.   Therapeutic Drug Monitoring  Her Cr is stable  Stopping vanco, no need for trough         Bobby Rumpf Infectious Diseases (pager) [phone] [email or web address] 09/24/2014, 12:26 PM  LOS: 3 days

## 2014-09-24 NOTE — Discharge Summary (Signed)
Physician Discharge Summary  Holly Mendez RDE:[id number] DOB: 08-01-1975 DOA: 09/21/2014  PCP: Holly Mcmurray, MD  Admit date: 09/21/2014 Discharge date: 09/24/2014  Time spent: 20 minutes  Recommendations for Outpatient Follow-up:  1. Follow up with PCP in 1-2 weeks 2. Please follow up PCR results as outpatient  Discharge Diagnoses:  Principal Problem:   Meningitis Active Problems:   Schizophrenia   Bipolar affective disorder, currently in remission   HTN (hypertension)   Dehydration   Leukocytosis   Headache   Blood poisoning   Morbid obesity   Discharge Condition: Improved  Diet recommendation: Regular  Filed Weights   09/22/14 0423 09/23/14 0639 09/24/14 0602  Weight: 117.2 kg (258 lb 6.1 oz) 115.667 kg (255 lb) 116.8 kg (257 lb 8 oz)    History of present illness:  Please see admit h and p from 9/12 for details. Briefly, pt presented with headache with fevers and nausea. The patient was admitted with concerns for possible acute meningitis.  Hospital Course:  Sepsis secondary to Acute meningitis -Patient was febrile upon admission with leukocytosis -Leukocytosis had improved -Pt's headache and neck stiffness improved markedly during this admission -Pt was continued on empiric IV vancomycin, ampicillin, ceftriaxone, dexamethasone -Patient underwent fluoroscopy guided LP by interventional radiology, CSF culture shows no organisms -Blood Cultures neg -Still pending HSV, enterovirus PCR - called lab/micro. These labs were sent out, arrived for testing 9/13, pending and would have patient follow up as outpatient -Case discussed with ID who recommends d/c antibiotics and clear for discharge with close outpatient follow up  Bipolar affective disorder/schizophrenia -Remained stable, currently on no home medications  Dehydration -Improved with IV fluids  GERD -Continue PPI  Left eye blindness -Remain stable  Morbid  Obesity -Stable  Consultations:  ID  Discharge Exam: Filed Vitals:   09/23/14 2157 09/24/14 0602 09/24/14 0835 09/24/14 1407  BP: 153/91 109/69  144/81  Pulse: 68 89 64 73  Temp: 97.5 F (36.4 C) 97.7 F (36.5 C)  98.3 F (36.8 C)  TempSrc: Oral Oral  Oral  Resp: 18  18 18   Height:      Weight:  116.8 kg (257 lb 8 oz)    SpO2: 98% 98% 98% 100%    General: Awake, in nad Cardiovascular: regular, s1, s2 Respiratory: normal resp effort, no wheezing  Discharge Instructions     Medication List    STOP taking these medications        loperamide 2 MG capsule  Commonly known as:  IMODIUM     ondansetron 4 MG tablet  Commonly known as:  ZOFRAN      TAKE these medications        albuterol 108 (90 BASE) MCG/ACT inhaler  Commonly known as:  PROVENTIL HFA;VENTOLIN HFA  Inhale 2 puffs into the lungs every 6 (six) hours as needed for wheezing or shortness of breath.     budesonide-formoterol 160-4.5 MCG/ACT inhaler  Commonly known as:  SYMBICORT  Inhale 2 puffs into the lungs 2 (two) times daily.     cetirizine 10 MG tablet  Commonly known as:  ZYRTEC  Take 10 mg by mouth daily.     fluticasone 50 MCG/ACT nasal spray  Commonly known as:  FLONASE  Place 2 sprays into both nostrils daily as needed for allergies or rhinitis.     ketorolac 10 MG tablet  Commonly known as:  TORADOL  Take 1 tablet (10 mg total) by mouth every 6 (six) hours as needed.  pregabalin 50 MG capsule  Commonly known as:  LYRICA  Take 50 mg by mouth 2 (two) times daily.     tizanidine 2 MG capsule  Commonly known as:  ZANAFLEX  Take 2 mg by mouth at bedtime.       Allergies  Allergen Reactions  . Norvasc [Amlodipine Besylate] Other (See Comments)    Mouth irritation  . Zithromax [Azithromycin] Other (See Comments)    Severe stomach cramping   Follow-up Information    Follow up with [facility].   Contact information:   [place]  [phone]       Follow up with [facility].   Specialty:  Family Medicine   Contact information:   [address] [place] [address] [phone]       Follow up with Holly Mcmurray, MD. Schedule an appointment as soon as possible for a visit on 10/14/2014.   Specialties:  Family Medicine, Sports Medicine   Why:  Hospital follow up,  Appointment will be on Oct 5th will call patient at home if get one closer   Contact information:   [address] [place] [address] [phone]        The results of significant diagnostics from this hospitalization (including imaging, microbiology, ancillary and laboratory) are listed below for reference.    Significant Diagnostic Studies: Ct Head Wo Contrast  09/24/2014   CLINICAL DATA:  Midline frontal headaches radiating to the vertex for 3 days.  EXAM: CT HEAD WITHOUT CONTRAST  TECHNIQUE: Contiguous axial images were obtained from the base of the skull through the vertex without intravenous contrast.  COMPARISON:  None.  FINDINGS: No acute cortical infarct, hemorrhage, or mass lesion is present. The ventricles are of normal size. No significant extra-axial fluid collection is evident. The paranasal sinuses and mastoid air cells are clear. The calvarium is intact.  No significant extracranial lesions are evident. The left globe is calcified. The right globe is within normal limits. The orbits are otherwise unremarkable.  IMPRESSION: 1. Negative CT of the head. 2. Left-sided phthisis bulbi.   Electronically Signed   By: San Morelle M.D.   On: 09/24/2014 13:46   Dg Lumbar Puncture Fluoro Guide  09/21/2014   CLINICAL DATA:  Severe headache and neck pain since last evening.  EXAM: DIAGNOSTIC LUMBAR PUNCTURE UNDER FLUOROSCOPIC GUIDANCE  FLUOROSCOPY TIME:  Radiation Exposure Index (as provided by the fluoroscopic device):  If the device does not provide the exposure index:  Fluoroscopy Time (in minutes and  seconds):  1 minutes and 12 seconds  Number of Acquired Images:  1  PROCEDURE: Informed consent was obtained from the patient prior to the procedure, including potential complications of headache, allergy, and pain. With the patient prone, the lower back was prepped with Betadine. 1% Lidocaine was used for local anesthesia. Lumbar puncture was performed at the L3-4 level using a 20 gauge needle with return of clear CSF. 10 ml of CSF were obtained for laboratory studies. The patient tolerated the procedure well and there were no apparent complications.  IMPRESSION: Fluoroscopic guided lumbar puncture with 10 cc of clear CSF obtained for appropriate laboratory evaluation.   Electronically Signed   By: Marijo Sanes M.D.   On: 09/21/2014 12:33    Microbiology: Recent Results (from the past 240 hour(s))  Blood culture (routine x 2)     Status: None (Preliminary result)   Collection Time: 09/21/14 11:13  AM  Result Value Ref Range Status   Specimen Description BLOOD RIGHT ANTECUBITAL  Final   Special Requests BOTTLES DRAWN AEROBIC AND ANAEROBIC 5ML  Final   Culture NO GROWTH 2 DAYS  Final   Report Status PENDING  Incomplete  Blood culture (routine x 2)     Status: None (Preliminary result)   Collection Time: 09/21/14 11:25 AM  Result Value Ref Range Status   Specimen Description BLOOD RIGHT HAND  Final   Special Requests BOTTLES DRAWN AEROBIC AND ANAEROBIC 5ML  Final   Culture NO GROWTH 2 DAYS  Final   Report Status PENDING  Incomplete  CSF culture     Status: None   Collection Time: 09/21/14 12:32 PM  Result Value Ref Range Status   Specimen Description CSF  Final   Special Requests NONE  Final   Gram Stain   Final    CYTOSPIN SLIDE WBC PRESENT,BOTH PMN AND MONONUCLEAR NO ORGANISMS SEEN    Culture NO GROWTH 3 DAYS  Final   Report Status 09/24/2014 FINAL  Final     Labs: Basic Metabolic Panel:  Recent Labs Lab 09/21/14 1114 09/21/14 1847 09/22/14 0518 09/23/14 0530 09/24/14 0619   NA 133*  --  135 138 138  K 3.9  --  3.8 4.3 3.9  CL 100*  --  105 103 101  CO2 23  --  22 26 27   GLUCOSE 136*  --  168* 160* 183*  BUN 6  --  8 11 10   CREATININE 0.80  --  0.84 0.76 0.82  CALCIUM 9.1  --  8.4* 8.8* 8.7*  MG  --  1.8  --   --   --    Liver Function Tests:  Recent Labs Lab 09/21/14 1114 09/22/14 0518  AST 22 22  ALT 17 16  ALKPHOS 79 63  BILITOT 0.5 0.5  PROT 7.7 6.7  ALBUMIN 3.6 3.2*   No results for input(s): LIPASE, AMYLASE in the last 168 hours. No results for input(s): AMMONIA in the last 168 hours. CBC:  Recent Labs Lab 09/21/14 1114 09/22/14 0518 09/23/14 0530 09/24/14 0619  WBC 12.8* 10.2 13.2* 12.7*  NEUTROABS 9.8*  --   --   --   HGB 12.8 11.9* 12.7 13.3  HCT 38.5 35.6* 38.2 40.4  MCV 81.1 80.0 81.1 81.3  PLT 286 231 265 319   Cardiac Enzymes: No results for input(s): CKTOTAL, CKMB, CKMBINDEX, TROPONINI in the last 168 hours. BNP: BNP (last 3 results)  Recent Labs  08/11/14 2023  BNP 34.8    ProBNP (last 3 results) No results for input(s): PROBNP in the last 8760 hours.  CBG: No results for input(s): GLUCAP in the last 168 hours.   Signed:  Milina Pagett, Orpah Melter  [facility] 09/24/2014, 2:24 PM

## 2014-09-24 NOTE — Progress Notes (Signed)
Holly Mendez to be D/C'd Home per MD order.  Discussed with the patient and all questions fully answered.  VSS, Skin clean, dry and intact without evidence of skin break down, no evidence of skin tears noted. IV catheter discontinued intact. Site without signs and symptoms of complications. Dressing and pressure applied.  An After Visit Summary was printed and given to the patient. Patient received prescription.  D/c education completed with patient including follow up instructions, medication list, d/c activities limitations if indicated, with other d/c instructions as indicated by MD - patient able to verbalize understanding, all questions fully answered.   Patient instructed to return to ED, call 911, or call MD for any changes in condition.   Patient escorted via [place], and D/C home via private auto.  Horton Finer 09/24/2014 3:44 PM

## 2014-09-24 NOTE — Care Management Note (Signed)
Case Management Note  Patient Details  Name: KAMRY FARACI MRN: [id number] Date of Birth: 05/17/1975  Subjective/Objective:                 Discharge to home, self care.   Action/Plan:   Expected Discharge Date:                  Expected Discharge Plan:  Home/Self Care  In-House Referral:     Discharge planning Services  CM Consult  Post Acute Care Choice:    Choice offered to:     DME Arranged:    DME Agency:     HH Arranged:    [place] Agency:     Status of Service:  Completed, signed off  Medicare Important Message Given:    Date Medicare IM Given:    Medicare IM give by:    Date Additional Medicare IM Given:    Additional Medicare Important Message give by:     If discussed at [place] of Stay Meetings, dates discussed:    Additional Comments:  Carles Collet, RN 09/24/2014, 2:36 PM

## 2014-09-26 LAB — CULTURE, BLOOD (ROUTINE X 2)
Culture: NO GROWTH
Culture: NO GROWTH

## 2014-10-01 ENCOUNTER — Ambulatory Visit (INDEPENDENT_AMBULATORY_CARE_PROVIDER_SITE_OTHER): Payer: Medicaid Other | Admitting: Family Medicine

## 2014-10-01 ENCOUNTER — Encounter: Payer: Self-pay | Admitting: Family Medicine

## 2014-10-01 VITALS — BP 136/98 | HR 90 | Temp 98.4°F | Wt 247.0 lb

## 2014-10-01 DIAGNOSIS — B370 Candidal stomatitis: Secondary | ICD-10-CM

## 2014-10-01 DIAGNOSIS — R51 Headache: Secondary | ICD-10-CM | POA: Diagnosis not present

## 2014-10-01 DIAGNOSIS — Z8249 Family history of ischemic heart disease and other diseases of the circulatory system: Secondary | ICD-10-CM

## 2014-10-01 DIAGNOSIS — R519 Headache, unspecified: Secondary | ICD-10-CM

## 2014-10-01 MED ORDER — NYSTATIN 100000 UNIT/ML MT SUSP: 5.0000 mL | Freq: Four times a day (QID) | OROMUCOSAL | Status: DC

## 2014-10-01 NOTE — Patient Instructions (Signed)
Nice to meet you today Scheduling MRI of brain Take nystatin 4 times daily for a week to treat thrush Keep the appointment you have with Dr. Nori Riis  Call with any questions or concerns.  Be well, Dr. Ardelia Mems

## 2014-10-02 NOTE — Progress Notes (Signed)
Date of Visit: 10/01/2014    HPI:  Pt presents for a same day appointment to discuss possible thrush.  Of note, patient was hospitalized from 9/12 to 9/15 with presumed meningitis, after presenting with headache, neck stiffness, and high fever. Treated with antibiotics (vancomycin, ampicillin, ceftriaxone) and steroids (dexamethasone). CSF cultures were negative, with PCR's for enterovirus and herpes pending at time of discharge. Patient denies having any history of oral or genital herpes. She was cared for on the [facility] service, and has an upcoming appointment here at the [facility] on 10/5 to establish care with Dr. Nori Riis.  Patient reports that since discharge from the hospital she has developed a thick coating on her mouth, especially on her tongue. Has had some difficulty swallowing due to the discomfort in her mouth. Even when she brushes her tongue some of the white coating is still there.  Patient also reports continued headaches since her hospitalization. They are worse with movement, and improved with being still. Denies any fevers. Reports history of both her mother and maternal aunt having cerebral aneurysms. No MRI's were done in the hospital. No vision changes or new focal neurological symptoms.  ROS: See HPI  [place]: history of bipolar/schizophrenia, hypertension, morbid obesity  PHYSICAL EXAM: BP 136/98 mmHg  Pulse 90  Temp(Src) 98.4 F (36.9 C) (Oral)  Wt 247 lb (112.038 kg)  LMP 08/27/2014 (Approximate) Gen: NAD, pleasant, cooperative HEENT: normocephalic, atraumatic. moist mucous membranes. White coating present on tongue. No meningismus. Heart: regular rate and rhythm  No murmur Lungs: clear to auscultation bilaterally, NWOB Neuro: cranial nerves II-XII tested and intact. Speech normal. Full strength bilat upper and lower ext. Normal FNF.   ASSESSMENT/PLAN:  1. Thrush - patient known to be at risk for oropharyngeal candidiasis given her recent treatment with both  antibiotics and steroids. History and exam consistent with this diagnosis. rx nystatin swish & swallow.  2. Headaches - no red flags (no recurrent fevers, neurological signs). Suspect these are post-lumbar puncture headaches, and had a long discussion with patient today to explain the etiology of spinal headaches. However, with her family history of cerebral aneurysms in multiple family members, as well as her recent admission for meningitis of unknown etiology, I believe MRI/MRA is warranted to evaluate further. Of note, the PCR orders for enterovirus and herpes seem to have been canceled when she was discharged. I will make an attempt to call the lab to find out if these results are still attainable. Patient will follow up as scheduled with Dr. Nori Riis.  FOLLOW UP: F/u as scheduled with Dr. Nori Riis to establish as patient here at [facility].  [place]. Ardelia Mems, [place]

## 2014-10-05 LAB — MISCELLANEOUS TEST

## 2014-10-07 NOTE — Telephone Encounter (Signed)
Left voice message informing patient to keep appointment for the 15th with Dr. Nori Riis only if she is feeling better, but if she is still having problems to schedule with a different provider before the 15th.  Derl Barrow, RN

## 2014-10-07 NOTE — Telephone Encounter (Signed)
If she is feeling OK then I will see her on the 15th as scheduled. If she is still having post hospital stay issues then schedule her w another provider before [facility]! [name]

## 2014-10-08 ENCOUNTER — Telehealth: Payer: Self-pay | Admitting: Family Medicine

## 2014-10-08 ENCOUNTER — Other Ambulatory Visit: Payer: Self-pay | Admitting: Family Medicine

## 2014-10-13 ENCOUNTER — Other Ambulatory Visit: Payer: Self-pay | Admitting: Family Medicine

## 2014-10-13 DIAGNOSIS — R51 Headache: Principal | ICD-10-CM

## 2014-10-13 DIAGNOSIS — R519 Headache, unspecified: Secondary | ICD-10-CM

## 2014-10-13 DIAGNOSIS — Z8249 Family history of ischemic heart disease and other diseases of the circulatory system: Secondary | ICD-10-CM

## 2014-10-14 ENCOUNTER — Encounter: Payer: Self-pay | Admitting: Family Medicine

## 2014-10-14 ENCOUNTER — Ambulatory Visit (INDEPENDENT_AMBULATORY_CARE_PROVIDER_SITE_OTHER): Payer: Medicaid Other | Admitting: Family Medicine

## 2014-10-14 ENCOUNTER — Ambulatory Visit: Payer: Medicaid Other | Admitting: Family Medicine

## 2014-10-14 VITALS — BP 130/82 | HR 86 | Temp 98.4°F | Ht 66.0 in | Wt 252.6 lb

## 2014-10-14 DIAGNOSIS — I10 Essential (primary) hypertension: Secondary | ICD-10-CM

## 2014-10-14 DIAGNOSIS — R739 Hyperglycemia, unspecified: Secondary | ICD-10-CM

## 2014-10-14 DIAGNOSIS — G4489 Other headache syndrome: Secondary | ICD-10-CM

## 2014-10-14 DIAGNOSIS — G039 Meningitis, unspecified: Secondary | ICD-10-CM | POA: Diagnosis not present

## 2014-10-14 DIAGNOSIS — F3170 Bipolar disorder, currently in remission, most recent episode unspecified: Secondary | ICD-10-CM

## 2014-10-15 ENCOUNTER — Ambulatory Visit (HOSPITAL_COMMUNITY): Admission: RE | Admit: 2014-10-15 | Payer: Medicaid Other | Source: Ambulatory Visit

## 2014-10-15 NOTE — Telephone Encounter (Signed)
Late entry note: Contacted hospital lab on 9/29 to inquire about patient's CSF HSV & enterovirus PCR's. Was told that these were never processed through the lab and had not been ordered. Was advised to order "add-on" labs to see if these could be added to remaining CSF sample. Was then called and advised that quantity not sufficient. So, in summary, we will not be able to find out if meningitis was due to enterovirus or HSV. Evidently state lab arbovirus panel is still pending, and has not yet resulted.  FYI to PCP, whom patient saw yesterday in clinic to establish care.  Leeanne Rio, MD

## 2014-10-17 ENCOUNTER — Encounter: Payer: Self-pay | Admitting: Family Medicine

## 2014-10-17 DIAGNOSIS — R739 Hyperglycemia, unspecified: Secondary | ICD-10-CM | POA: Insufficient documentation

## 2014-10-17 DIAGNOSIS — Z9889 Other specified postprocedural states: Secondary | ICD-10-CM | POA: Insufficient documentation

## 2014-10-17 NOTE — Assessment & Plan Note (Signed)
[place] she has some history of chronic headaches but what she said scribing now seems more related to her recent hospitalization for meningitis.  We will follow this, see her and once.  I expect them to resolve as she recovers from meningitis.  Answered her questions regarding meningitis.

## 2014-10-17 NOTE — Progress Notes (Signed)
   Subjective:    Patient ID: Holly Mendez, female    DOB: 11-May-1975, 39 y.o.   MRN: [id number]  HPI  The patient my practice here for establishment of care and follow-up recent hospitalization.  Hospitalized for meningitis.  Since then she has continued to have daily headaches.  They have decreased in intensity, now 6 out of 10 at their worst.  At the time of hospitalization #10 out of 10.  She still has a lot of questions about why she had meningitis #2.  History of mental health issues.  Followed by mental [place].  She takes her medicines regularly.  Not complaining currently of any mood issues. #3.  COPD.  Using her inhalers regularly and not currently having any issues. #4.Hypertension: Well controlled.  Nonsmoker.  No regular exercise.  No problems with her medicines.  Compliant.  Review of Systems Positive for headache.  Negative for confusion.  No unusual weight change, fever, sweats, chills.  Denies chest pain, no shortness of breath, no dyspnea on exertion, no lower extremity edema, no palpitations.    Objective:   Physical Exam Vital signs reviewed. GENERAL: Well-developed, well-nourished, no acute distress. CARDIOVASCULAR: Regular rate and rhythm no murmur gallop or rub LUNGS: Clear to auscultation bilaterally, no rales or wheeze. ABDOMEN: Soft positive bowel sounds NEURO: No gross focal neurological deficits. MSK: Movement of extremity x 4. PSYCH: AxO 4.  Speech is normal in content and fluency.  Recent and remote memory is intact.         Assessment & Plan:  And next office visit we will follow-up her headaches to see if they have improved and she continues her recovery from meningitis.  We will also recheck her blood sugar given some of her CBGs were elevated during her hospitalization.

## 2014-10-17 NOTE — Assessment & Plan Note (Signed)
Will recheck next visit.

## 2014-10-17 NOTE — Assessment & Plan Note (Signed)
Currently good control.  No medication changes.  Reviewed her lab work was just done while she was in the hospital and her creatinine is normal, electrolytes normal, glucose Was elevated.  We'll recheck on see her back.

## 2014-10-28 ENCOUNTER — Ambulatory Visit (INDEPENDENT_AMBULATORY_CARE_PROVIDER_SITE_OTHER): Payer: Medicaid Other | Admitting: Family Medicine

## 2014-10-28 ENCOUNTER — Other Ambulatory Visit: Payer: Self-pay | Admitting: Family Medicine

## 2014-10-28 ENCOUNTER — Encounter: Payer: Self-pay | Admitting: Family Medicine

## 2014-10-28 ENCOUNTER — Telehealth: Payer: Self-pay | Admitting: Family Medicine

## 2014-10-28 VITALS — BP 125/104 | HR 87 | Temp 97.8°F | Ht 66.0 in | Wt 252.2 lb

## 2014-10-28 DIAGNOSIS — T7840XA Allergy, unspecified, initial encounter: Secondary | ICD-10-CM | POA: Diagnosis present

## 2014-10-28 MED ORDER — METHYLPREDNISOLONE ACETATE 40 MG/ML IJ SUSP
40.0000 mg | Freq: Once | INTRAMUSCULAR | Status: AC
  Administered 2014-10-28: 40 mg via INTRA_ARTICULAR

## 2014-10-28 MED ORDER — PREDNISONE 50 MG PO TABS: 50.0000 mg | ORAL_TABLET | Freq: Every day | ORAL | Status: DC

## 2014-10-28 NOTE — Patient Instructions (Signed)
Take prednisone 50mg daily for 5 days Go to your pharmacy, they'll be able to get you a quantity of the pink version of the pills Do NOT take any more of the kind that gave you a rash Giving you a shot of steroids here as well  If you have any swelling of your lips, tongue, throat, or any wheezing or trouble breathing please go to to the ER immediately Also do this if you have any sores in your mouth or genital area  Be well, Dr.   Drug Allergy Allergic reactions to medicines are common. Some allergic reactions are mild. A delayed type of drug allergy that occurs 1 week or more after exposure to a medicine or vaccine is called serum sickness. A life-threatening, sudden (acute) allergic reaction that involves the whole body is called anaphylaxis. CAUSES  "True" drug allergies occur when there is an allergic reaction to a medicine. This is caused by overactivity of the immune system. First, the body becomes sensitized. The immune system is triggered by your first exposure to the medicine. Following this first exposure, future exposure to the same medicine may be life-threatening. Almost any medicine can cause an allergic reaction. Common ones are:  Penicillin.  Sulfonamides (sulfa drugs).  Local anesthetics.  X-ray dyes that contain iodine. SYMPTOMS  Common symptoms of a minor allergic reaction are:  Swelling around the mouth.  An itchy red rash or hives.  Vomiting or diarrhea. Anaphylaxis can cause swelling of the mouth and throat. This makes it difficult to breathe and swallow. Severe reactions can be fatal within seconds, even after exposure to only a trace amount of the drug that causes the reaction. HOME CARE INSTRUCTIONS  If you are unsure of what caused your reaction, write down:  The names of the medicines you took.  How much medicine you took.  How you took the medicine, such as whether you took a pill, injected the medicine, or applied it to your skin.  All  of the things you ate and drank.  The date and time of your reaction.  The symptoms of the reaction.  You may want to follow up with an allergy specialist after the reaction has cleared in order to be tested to confirm the allergy. It is important to confirm that your reaction is an allergy, not just a side effect to the medicine. If you have a true allergy to a medicine, this may prevent that medicine and related medicines from being given to you when you are very ill.  If you have hives or a rash:  Take medicines as directed by your caregiver.  You may use an over-the-counter antihistamine (diphenhydramine) as needed.  Apply cold compresses to the skin or take baths in cool water. Avoid hot baths or showers.  If you are severely allergic:  Continuous observation after a severe reaction may be needed. Hospitalization is often required.  Wear a medical alert bracelet or necklace stating your allergy.  You and your family must learn how to use an anaphylaxis kit or give an epinephrine injection to temporarily treat an emergency allergic reaction. If you have had a severe reaction, always carry your epinephrine injection or anaphylaxis kit with you. This can be lifesaving if you have a severe reaction.  Do not drive or perform tasks after treatment until the medicines used to treat your reaction have worn off, or until your caregiver says it is okay.  If you have a drug allergy that was confirmed by your   health care provider:  Carry information about the drug allergy with you at all times.  Always check with a pharmacist before taking any over-the-counter medicine. SEEK MEDICAL CARE IF:   You think you had an allergic reaction. Symptoms usually start within 30 minutes after exposure.  Symptoms are getting worse rather than better.  You develop new symptoms.  The symptoms that brought you to your caregiver return. SEEK IMMEDIATE MEDICAL CARE IF:   You have swelling of the  mouth, difficulty breathing, or wheezing.  You have a tight feeling in your chest or throat.  You develop hives, swelling, or itching all over your body.  You develop severe vomiting or diarrhea.  You feel faint or pass out. This is an emergency. Use your epinephrine injection or anaphylaxis kit as you have been instructed. Call for emergency medical help. Even if you improve after the injection, you need to be examined at a hospital emergency department. MAKE SURE YOU:   Understand these instructions.  Will watch your condition.  Will get help right away if you are not doing well or get worse.   This information is not intended to replace advice given to you by your health care provider. Make sure you discuss any questions you have with your health care provider.   Document Released: 12/26/2004 Document Revised: 01/16/2014 Document Reviewed: 07/28/2014 Elsevier Interactive Patient Education 2016 Elsevier Inc.  

## 2014-10-28 NOTE — Telephone Encounter (Signed)
Family Medicine After hours phone call  Patient called complaining of whole body itching and rash. Denies any throat swelling or difficulty breathing. Says she recently picked up tegretol but the pill changed, she is not sure if it is a new manufacturer or not but it was a different dose; she had been on this medicine before. She says she was recently in the hospital with a diagnosis of meningitis. I strongly encouraged her that she needed to be seen by a doctor to have the rash looked at, says she has financial issues and really wanted to wait until clinic appointment. Recommended she try taking a benadryl, which she says she would need to go out and buy but doesn't have any money (told her this was over the counter and shouldn't be more than $5). ED precautions for anaphylaxis symptoms. I informed her sometimes drug rashes can be very dangerous (Stevens-johnson) and not to hesitate to go to the ED if she gets worse. Earliest clinic appointment available with Dr. Ardelia Mems at 2:45pm.  Tawanna Sat, MD 10/28/2014, 5:48 AM PGY-3, [place]

## 2014-10-28 NOTE — Telephone Encounter (Signed)
Refill request for albuterol. Also, patient requesting meds to help with the pain in her back and leg. Please advise.

## 2014-10-28 NOTE — Progress Notes (Signed)
Date of Visit: 10/28/2014   HPI:  Pt presents for a same day appointment to discuss allergic reaction to medication.  Patient takes carbamazepine as part of her medical regimen for bipolar/schizophrenia. She is followed by Dr. Sherilyn Cooter at [facility] in [place] (located on [facility]). Earlier this week she had her carbamazepine dose increased from 200mg  twice daily to 400mg  in the morning and 200mg  at night. She picked up the new prescription yesterday, and the pharmacy had to give her a new type of tablet. She previously took oval-shaped pink tablets, and this time they gave her circular white tablets because they did not have enough of the pink tablets.  She took the first dose of 2 white tablets at about 4pm, since she missed her morning dose. Within about 2 hours, she began breaking out in a rash on her arms and legs, that was intensely pruritic. It again worsened when she took the next dose later that night. She denies any fever, sores in mouth or genitals, swelling of her tongue/throat/lips, or any difficulty breathing. She feels well except for the intense itching.  ROS: See HPI  [place]: history of bipolar/schizophrenia, hypertension, recent hospitalization for meningitis, morbid obesity  PHYSICAL EXAM: BP 125/104 mmHg  Pulse 87  Temp(Src) 97.8 F (36.6 C) (Oral)  Ht 5\' 6"  (1.676 m)  Wt 252 lb 3.2 oz (114.397 kg)  BMI 40.73 kg/m2 Gen: NAD, pleasant, cooperative Skin: maculopapular rash on arms and legs without skin breakdown, also present on back HEENT: no oral lesions, lip, or tongue swelling Resp: normal work of breathing   ASSESSMENT/PLAN:  1. Allergic reaction - most likely this is an allergic drug eruption, likely to a component of the new type of tablet patient was given. Doubt any actual allergy to carbamazepine, as the timeline is so consistent with dosing of the new formulation of tablet.  Plan: - I called and spoke with patient's pharmacy, and they  will be able to give her a small supply of the original pink tablets until they are able to get more in stock. She will then follow up there to get the rest of the amount at a later time - encouraged patient to call her psychiatrist and inform her of these developments - depomedrol 40mg  IM now - prednisone 50mg  daily for 5 days - discussed return precautions  FOLLOW UP: F/u as needed if symptoms worsen or do not improve.   [place]. Ardelia Mems, [place]

## 2014-10-29 ENCOUNTER — Telehealth: Payer: Self-pay | Admitting: Family Medicine

## 2014-10-29 NOTE — Telephone Encounter (Signed)
Melanie from the pre-service center is calling because the patient will have an MRA-Head done tomorrow that needs prior authorization. Thank you, Fonda Kinder, ASA

## 2014-10-29 NOTE — Telephone Encounter (Signed)
Will forward to MD and to referral coordinator to see if there is any update on the imaging.  Halah Whiteside,CMA

## 2014-10-29 NOTE — Telephone Encounter (Signed)
Calling again, says the MRA-head has been approved but MRI of brain without contrast has not been approved yet, wants to know if the doctor wants just one procedure done or both? If both, needs a prior auth done for the other referral.

## 2014-10-29 NOTE — Telephone Encounter (Signed)
Both MRI and MRA have been pre-cert'd for pt's appt tomorrow.

## 2014-10-30 ENCOUNTER — Ambulatory Visit (HOSPITAL_COMMUNITY)
Admission: RE | Admit: 2014-10-30 | Discharge: 2014-10-30 | Disposition: A | Discharge status: Home / Self Care | Payer: Medicaid Other | Source: Ambulatory Visit | Attending: Family Medicine | Admitting: Family Medicine

## 2014-10-30 ENCOUNTER — Encounter: Payer: Self-pay | Admitting: Family Medicine

## 2014-10-30 DIAGNOSIS — J329 Chronic sinusitis, unspecified: Secondary | ICD-10-CM | POA: Diagnosis not present

## 2014-10-30 DIAGNOSIS — R51 Headache: Secondary | ICD-10-CM | POA: Diagnosis not present

## 2014-10-30 DIAGNOSIS — Z8249 Family history of ischemic heart disease and other diseases of the circulatory system: Secondary | ICD-10-CM | POA: Diagnosis not present

## 2014-10-30 DIAGNOSIS — I671 Cerebral aneurysm, nonruptured: Secondary | ICD-10-CM | POA: Insufficient documentation

## 2014-10-30 DIAGNOSIS — R519 Headache, unspecified: Secondary | ICD-10-CM

## 2014-10-30 MED ORDER — DICLOFENAC SODIUM 75 MG PO TBEC: 75.0000 mg | DELAYED_RELEASE_TABLET | Freq: Two times a day (BID) | ORAL | Status: DC

## 2014-10-30 MED ORDER — ALBUTEROL SULFATE HFA 108 (90 BASE) MCG/ACT IN AERS: 2.0000 | INHALATION_SPRAY | Freq: Four times a day (QID) | RESPIRATORY_TRACT | Status: DC | PRN

## 2014-12-08 ENCOUNTER — Telehealth: Payer: Self-pay | Admitting: *Deleted

## 2014-12-08 NOTE — Telephone Encounter (Signed)
Received call from Juanetta Beets at [facility]. States patient in need of new prosthetic eye. Form was faxed to provider on 11/23/14 Please follow-up. Hubbard Hartshorn, RN

## 2014-12-09 ENCOUNTER — Ambulatory Visit: Payer: Medicaid Other | Admitting: Family Medicine

## 2014-12-10 ENCOUNTER — Encounter: Payer: Self-pay | Admitting: Family Medicine

## 2014-12-10 DIAGNOSIS — H5440 Blindness, one eye, unspecified eye: Secondary | ICD-10-CM | POA: Insufficient documentation
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
## 2014-12-16 ENCOUNTER — Telehealth: Payer: Self-pay | Admitting: Family Medicine

## 2014-12-16 NOTE — Telephone Encounter (Signed)
Can dr neal give pt something for her back?  Some type of pain medication. Could hardly get out of bed this morning

## 2014-12-16 NOTE — Telephone Encounter (Signed)
Dear [name] Team She would need to be seen for her back issues. Pain medicine is usually not something I rx for back pain and certainly not without her being seen.. As I am out of office until next week, she could be placed on SDA schedule if she wants  THANKS! [name]

## 2014-12-18 NOTE — Telephone Encounter (Signed)
LVM for pt to call back to inform her of below. Zimmerman Rumple, April D, CMA  

## 2014-12-22 ENCOUNTER — Encounter: Payer: Self-pay | Admitting: Family Medicine

## 2014-12-22 ENCOUNTER — Ambulatory Visit (INDEPENDENT_AMBULATORY_CARE_PROVIDER_SITE_OTHER): Payer: Medicaid Other | Admitting: Family Medicine

## 2014-12-22 VITALS — BP 139/79 | HR 85 | Temp 97.8°F | Ht 66.0 in | Wt 259.8 lb

## 2014-12-22 DIAGNOSIS — F3170 Bipolar disorder, currently in remission, most recent episode unspecified: Secondary | ICD-10-CM | POA: Diagnosis not present

## 2014-12-22 DIAGNOSIS — M549 Dorsalgia, unspecified: Secondary | ICD-10-CM

## 2014-12-22 DIAGNOSIS — G8929 Other chronic pain: Secondary | ICD-10-CM

## 2014-12-22 NOTE — Assessment & Plan Note (Signed)
No red flags by history or physical.  Evidently chronic for some time but worse with cold weather. Heat, movement, Zanaflex for pain relief.  Continue diclofenac. She has tried physical therapy which helped in the past but she has Medicaid which only pays for limited amount of physical therapy. Follow-up in 1 month with PCP. If still with pain can try physical therapy as will be a new year at that time.

## 2014-12-22 NOTE — Telephone Encounter (Signed)
PT had an appointment to discuss this on SDA visit with Dr. Mingo Amber on 12/22/2014. Katharina Caper, April D, [place]

## 2014-12-22 NOTE — Assessment & Plan Note (Signed)
Of note she mentions today that she was taken off of Seroquel and started on Abilify as of 1 week ago. I have adjusted her medication list.

## 2014-12-22 NOTE — Patient Instructions (Addendum)
You are correct in that back pain can be difficult to treat.    I'm going to send in the muscle relaxer for your back.  Start with this at night as it may make you sleepy.   Come back to see Dr. Nori Riis in about 1 month to see how you're doing.    Keep movement in your back as much as possible.    It was good to meet you today.
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
## 2014-12-24 ENCOUNTER — Telehealth: Payer: Self-pay | Admitting: Family Medicine

## 2014-12-24 NOTE — Telephone Encounter (Signed)
Pt called because she was prescribed Lyrica and the last time she took this she ended up in the ER with a swollen head and other parts. She was told by the ED not to take this. She would like to know what she should do. Please call since she is not going to take this medication until speaking to someone from our office. jw

## 2014-12-28 NOTE — Telephone Encounter (Signed)
Dear Holly Mendez Team I am confused---when she says she "was prescribed Lyrica"----WHO gave ut to her? Dr. Mingo Amber who saw her last tallked about zanaflex and diclofenac.   Of course I do not want her to take Lyrica if she had problems with it before    [facility]! [name]

## 2014-12-29 NOTE — Telephone Encounter (Signed)
Spoke with patient, she states that when pharmacy called after her visit on 12/13 they told her MD sent in a rx for lyrica, states she has reactions to lyrica and did not pick this medication up. She states that this was the only medicine at the pharmacy (of note: It doesn't look like anything was ever sent to her pharmacy that day even though MD says he was going to prescribe zanaflex). Would like to know if MD could send in the muscle relaxer he mentioned at last office visit.

## 2014-12-29 NOTE — Telephone Encounter (Signed)
LM for patient to call back. Leliana Kontz,CMA  

## 2014-12-30 MED ORDER — TIZANIDINE HCL 2 MG PO CAPS: 2.0000 mg | ORAL_CAPSULE | Freq: Three times a day (TID) | ORAL | Status: DC | PRN

## 2014-12-30 NOTE — Telephone Encounter (Signed)
LVM for pt to call back to inform her of below. Zimmerman Rumple, Aseel Truxillo D, CMA  

## 2014-12-30 NOTE — Telephone Encounter (Signed)
Dear [name] Team This has been done Please lether know THANKS! [name]

## 2015-01-20 ENCOUNTER — Other Ambulatory Visit: Payer: Self-pay | Admitting: *Deleted

## 2015-01-20 MED ORDER — CETIRIZINE HCL 10 MG PO TABS: 10.0000 mg | ORAL_TABLET | Freq: Every day | ORAL | Status: DC

## 2015-01-20 MED ORDER — SYMBICORT 80-4.5 MCG/ACT IN AERO: 2.0000 | INHALATION_SPRAY | Freq: Two times a day (BID) | RESPIRATORY_TRACT | Status: DC

## 2015-02-04 ENCOUNTER — Ambulatory Visit: Payer: Medicaid Other | Admitting: Family Medicine

## 2015-02-06 ENCOUNTER — Telehealth: Payer: Self-pay | Admitting: Family Medicine

## 2015-02-06 NOTE — Telephone Encounter (Signed)
Emergency Line / After Hours Call  She reports starting zanaflex. She has had swelling of her lower extremity for the past three days since starting the new medication. She denies any chest pain or SOB. The LE swelling is bilateral. Advised to stop the zanaflex until her appt with Dr. Nori Riis on Monday.   Rosemarie Ax, MD PGY-3, [facility] 02/06/2015, 3:49 AM

## 2015-02-08 ENCOUNTER — Ambulatory Visit (INDEPENDENT_AMBULATORY_CARE_PROVIDER_SITE_OTHER): Payer: Medicaid Other | Admitting: Family Medicine

## 2015-02-08 ENCOUNTER — Encounter: Payer: Self-pay | Admitting: Family Medicine

## 2015-02-08 ENCOUNTER — Other Ambulatory Visit (HOSPITAL_COMMUNITY)
Admission: RE | Admit: 2015-02-08 | Discharge: 2015-02-08 | Disposition: A | Discharge status: Home / Self Care | Payer: Medicaid Other | Source: Ambulatory Visit | Attending: Family Medicine | Admitting: Family Medicine

## 2015-02-08 VITALS — BP 124/80 | HR 83 | Temp 98.3°F | Wt 270.0 lb

## 2015-02-08 DIAGNOSIS — A499 Bacterial infection, unspecified: Secondary | ICD-10-CM

## 2015-02-08 DIAGNOSIS — A5901 Trichomonal vulvovaginitis: Secondary | ICD-10-CM

## 2015-02-08 DIAGNOSIS — N760 Acute vaginitis: Secondary | ICD-10-CM

## 2015-02-08 DIAGNOSIS — Z113 Encounter for screening for infections with a predominantly sexual mode of transmission: Secondary | ICD-10-CM | POA: Diagnosis present

## 2015-02-08 DIAGNOSIS — B9689 Other specified bacterial agents as the cause of diseases classified elsewhere: Secondary | ICD-10-CM

## 2015-02-08 LAB — POCT WET PREP (WET MOUNT): Clue Cells Wet Prep Whiff POC: POSITIVE

## 2015-02-08 LAB — POCT URINE PREGNANCY: PREG TEST UR: NEGATIVE

## 2015-02-08 MED ORDER — METRONIDAZOLE 500 MG PO TABS: 500.0000 mg | ORAL_TABLET | Freq: Two times a day (BID) | ORAL | Status: DC

## 2015-02-08 NOTE — Progress Notes (Signed)
Per chart review of Care Everywhere, patient had last pap smear on 02/13/2014 which was negative. Updated in Health Maintenance.

## 2015-02-08 NOTE — Progress Notes (Signed)
    Subjective   Holly Mendez is a 40 y.o. female that presents for a same day visit  1. Vaginal discharge: Symptoms started about 3 weeks ago. She has a milky white discharge that is changed. She has associated odor that is intermittent. No fevers. She does endorse some nausea when she smells the odor. She is currently sexually active but very infrequent. She has sex with multiple people in the last 3 months and used a condom. She has been treated for trichomonas in the past. She has a history of uterine ablation. She is also concerned for pregnancy. She has had some spotting for a few days which has resolved.  ROS Per HPI  Social History  Substance Use Topics  . Smoking status: Current Some Day Smoker -- 0.10 packs/day for 1 years    Types: Cigarettes  . Smokeless tobacco: Never Used  . Alcohol Use: Yes     Comment: 09/22/2014 "might have a wine cooler q couple months"    Allergies  Allergen Reactions  . Norvasc [Amlodipine Besylate] Other (See Comments)    Mouth irritation  . Zithromax [Azithromycin] Other (See Comments)    Severe stomach cramping    Objective   BP 124/80 mmHg  Pulse 83  Temp(Src) 98.3 F (36.8 C) (Oral)  Wt 270 lb (122.471 kg)  General: Well appearing, no distress Genitourinary: No external vaginal lesions. Vagina with copious white discharge, no blood or purulence.  Assessment and Plan   1. Trichomonas vaginitis - POCT urine pregnancy - POCT Wet Prep [facility]) - Cervicovaginal ancillary only - metroNIDAZOLE (FLAGYL) 500 MG tablet; Take 1 tablet (500 mg total) by mouth 2 (two) times daily.  Dispense: 14 tablet; Refill: 0  2. Bacterial vaginosis - metroNIDAZOLE (FLAGYL) 500 MG tablet; Take 1 tablet (500 mg total) by mouth 2 (two) times daily.  Dispense: 14 tablet; Refill: 0

## 2015-02-08 NOTE — Patient Instructions (Signed)
Thank you for coming to see me today. It was a pleasure. Today we talked about:   Trichomonas and Bacterial Vaginosis: I am prescribing a 7 day course of Metronidazole 500mg  twice daily. Please do not drink alcohol for at least 3 days after you have completed this medication. Please inform all sexual partners as Trichomonas is considered sexually transmitted.  If you have any questions or concerns, please do not hesitate to call the office at [phone].  Sincerely,  Cordelia Poche, MD

## 2015-02-09 LAB — CERVICOVAGINAL ANCILLARY ONLY
Chlamydia: NEGATIVE
NEISSERIA GONORRHEA: NEGATIVE

## 2015-02-10 ENCOUNTER — Encounter: Payer: Self-pay | Admitting: *Deleted

## 2015-02-22 ENCOUNTER — Other Ambulatory Visit: Payer: Self-pay

## 2015-02-22 DIAGNOSIS — Z1231 Encounter for screening mammogram for malignant neoplasm of breast: Secondary | ICD-10-CM

## 2015-02-23 ENCOUNTER — Other Ambulatory Visit: Payer: Self-pay | Admitting: *Deleted

## 2015-02-23 MED ORDER — FLUTICASONE PROPIONATE 50 MCG/ACT NA SUSP: 2.0000 | Freq: Every day | NASAL | Status: DC | PRN

## 2015-03-09 ENCOUNTER — Ambulatory Visit: Payer: Medicaid Other

## 2015-03-10 ENCOUNTER — Ambulatory Visit (INDEPENDENT_AMBULATORY_CARE_PROVIDER_SITE_OTHER): Payer: Medicaid Other | Admitting: Family Medicine

## 2015-03-10 ENCOUNTER — Encounter: Payer: Self-pay | Admitting: Family Medicine

## 2015-03-10 VITALS — BP 140/80 | HR 71 | Temp 98.9°F | Ht 66.0 in | Wt 262.0 lb

## 2015-03-10 DIAGNOSIS — M545 Low back pain, unspecified: Secondary | ICD-10-CM

## 2015-03-10 DIAGNOSIS — N760 Acute vaginitis: Secondary | ICD-10-CM

## 2015-03-10 DIAGNOSIS — B9689 Other specified bacterial agents as the cause of diseases classified elsewhere: Secondary | ICD-10-CM

## 2015-03-10 DIAGNOSIS — G8929 Other chronic pain: Secondary | ICD-10-CM

## 2015-03-10 DIAGNOSIS — A499 Bacterial infection, unspecified: Secondary | ICD-10-CM

## 2015-03-10 DIAGNOSIS — M549 Dorsalgia, unspecified: Secondary | ICD-10-CM | POA: Diagnosis not present

## 2015-03-10 DIAGNOSIS — A5901 Trichomonal vulvovaginitis: Secondary | ICD-10-CM | POA: Diagnosis not present

## 2015-03-10 MED ORDER — METRONIDAZOLE 500 MG PO TABS: 500.0000 mg | ORAL_TABLET | Freq: Two times a day (BID) | ORAL | Status: DC

## 2015-03-10 MED ORDER — AMITRIPTYLINE HCL 25 MG PO TABS: ORAL_TABLET | ORAL | Status: DC

## 2015-03-10 NOTE — Patient Instructions (Signed)
Please get your back X rays Start the amitriptylene Please see me back in 3-5 weeks

## 2015-03-10 NOTE — Assessment & Plan Note (Signed)
We'll have any recent imaging. Like to set that up and have given her information put the order in. I'll try her on amitriptyline and see her back in 3-4 weeks. This seems more muscular than radicular. She has done physical therapy in the past without much improvement. Body habitus is probably contributing this as he is low core strength.

## 2015-03-10 NOTE — Progress Notes (Signed)
   Subjective:    Patient ID: Holly Mendez, female    DOB: [birth year], 40 y.o.   MRN: [id number]  HPI  back pain: low back pain last several months, gradually worsening. Painful to the touch. It is central in the low back, does not radiate. No bowel or bladder changes. Not necessarily better with lying down. Definitely worse with extended standing. The muscle relaxer gave her did not seem to help and it caused her lower extremities to swell so she stopped it. At one point she had been on Lyrica and that seemed to help with her pain but she had a lot of extremity swelling with that so they had to stop that. She does not know if she's been on gabapentin in the past.   Trichomoniasis: before she completed her treatment she had sexual intercourse with her husband. He did not get treated. Now she has return of her vaginal discharge and itchy symptoms.  PERTINENT  PMH / PSH: I have reviewed the patient's medications, allergies, past medical and surgical history. Pertinent findings that relate to today's visit / issues include: No hx back surgery  Review of Systems  see history of present illness    Objective:   Physical Exam  vital signs reviewed GEN.: Well-developed overweight female no acute distress BACK: mildly tender to palpation in the paravertebral muscles in the lower thoracic upper lumbar spine area. Mild tenderness to palpation over the PSIS right greater than left but bilateral. Normal flexion and extension at the hips. Normal rotation laterally. Negative straight leg raise bilaterally  In seated and supine position.  EXTREMITY: Lower extremity strength 5 out of 5 hip flexion extension , lower leg extension and flexion , plantar and dorsiflexion.  Neuro: DTRs 1+ to 2+ bilateral symmetrical at knee and ankle       Assessment & Plan:   #1. Trichomoniasis : prescription for her and her husband instructions to complete treatment.

## 2015-03-15 ENCOUNTER — Ambulatory Visit
Admission: RE | Admit: 2015-03-15 | Discharge: 2015-03-15 | Disposition: A | Discharge status: Home / Self Care | Payer: Medicaid Other | Source: Ambulatory Visit | Attending: Family Medicine | Admitting: Family Medicine

## 2015-03-15 ENCOUNTER — Telehealth: Payer: Self-pay | Admitting: Family Medicine

## 2015-03-15 DIAGNOSIS — M545 Low back pain, unspecified: Secondary | ICD-10-CM

## 2015-03-15 NOTE — Telephone Encounter (Signed)
Would like results from todays x rays  [phone]

## 2015-03-16 NOTE — Telephone Encounter (Signed)
Pt informed. Brenden Rudman T Jasara Corrigan, CMA  

## 2015-03-16 NOTE — Telephone Encounter (Signed)
Dear [name] Team X rays show some mild arthritic change--nothing worrisome.  I will go over them when I see her back in office [facility]! Holly Mendez

## 2015-03-17 ENCOUNTER — Other Ambulatory Visit: Payer: Self-pay | Admitting: *Deleted

## 2015-03-17 MED ORDER — FLUTICASONE PROPIONATE 50 MCG/ACT NA SUSP: 2.0000 | Freq: Every day | NASAL | Status: DC | PRN

## 2015-03-30 ENCOUNTER — Telehealth: Payer: Self-pay | Admitting: Family Medicine

## 2015-03-30 NOTE — Telephone Encounter (Signed)
Holly Mendez was given info for results.  Unable to keep appt due to issue with transportation..  Want to know if she need to continue on the muscle relaxer med that she was given.  Please call to advise.

## 2015-03-31 ENCOUNTER — Ambulatory Visit: Payer: Medicaid Other | Admitting: Family Medicine

## 2015-03-31 NOTE — Telephone Encounter (Signed)
Dear [name] Team I think she can stop that and use it  PRN. Holly Mendez

## 2015-03-31 NOTE — Telephone Encounter (Signed)
LVM for pt to call back to inform her of below. Zimmerman Rumple, April D, CMA  

## 2015-04-01 NOTE — Telephone Encounter (Signed)
Pt called back and was given the message below and she stated that she understood. jw

## 2015-04-06 ENCOUNTER — Other Ambulatory Visit: Payer: Self-pay | Admitting: *Deleted

## 2015-04-07 MED ORDER — TIZANIDINE HCL 2 MG PO CAPS: 2.0000 mg | ORAL_CAPSULE | Freq: Two times a day (BID) | ORAL | Status: DC | PRN

## 2015-05-06 ENCOUNTER — Telehealth: Payer: Self-pay | Admitting: *Deleted

## 2015-05-06 MED ORDER — AMITRIPTYLINE HCL 25 MG PO TABS: ORAL_TABLET | ORAL | Status: DC

## 2015-05-06 NOTE — Telephone Encounter (Signed)
Patient requesting refill on amitryptylline. Holly Mendez

## 2015-06-09 ENCOUNTER — Other Ambulatory Visit: Payer: Self-pay | Admitting: *Deleted

## 2015-06-09 MED ORDER — AMITRIPTYLINE HCL 25 MG PO TABS: ORAL_TABLET | ORAL | Status: DC

## 2015-06-18 ENCOUNTER — Ambulatory Visit: Payer: Medicaid Other

## 2015-07-12 ENCOUNTER — Other Ambulatory Visit: Payer: Self-pay | Admitting: *Deleted

## 2015-07-12 MED ORDER — FLUTICASONE PROPIONATE 50 MCG/ACT NA SUSP: 2.0000 | Freq: Every day | NASAL | Status: DC | PRN

## 2015-07-30 ENCOUNTER — Ambulatory Visit: Payer: Medicaid Other

## 2015-08-02 ENCOUNTER — Observation Stay (HOSPITAL_COMMUNITY)
Admission: EM | Admit: 2015-08-02 | Discharge: 2015-08-03 | Disposition: A | Discharge status: Home / Self Care | Payer: Medicaid Other | Attending: Family Medicine | Admitting: Family Medicine

## 2015-08-02 ENCOUNTER — Encounter (HOSPITAL_COMMUNITY): Payer: Self-pay | Admitting: Emergency Medicine

## 2015-08-02 ENCOUNTER — Observation Stay (HOSPITAL_COMMUNITY): Payer: Medicaid Other

## 2015-08-02 ENCOUNTER — Emergency Department (HOSPITAL_COMMUNITY)
Admission: EM | Admit: 2015-08-02 | Discharge: 2015-08-02 | Disposition: A | Discharge status: Home / Self Care | Payer: Medicaid Other | Source: Home / Self Care | Attending: Emergency Medicine | Admitting: Emergency Medicine

## 2015-08-02 ENCOUNTER — Encounter (HOSPITAL_COMMUNITY): Payer: Self-pay | Admitting: *Deleted

## 2015-08-02 DIAGNOSIS — R109 Unspecified abdominal pain: Secondary | ICD-10-CM

## 2015-08-02 DIAGNOSIS — K861 Other chronic pancreatitis: Secondary | ICD-10-CM

## 2015-08-02 DIAGNOSIS — K859 Acute pancreatitis without necrosis or infection, unspecified: Secondary | ICD-10-CM | POA: Diagnosis present

## 2015-08-02 DIAGNOSIS — J45909 Unspecified asthma, uncomplicated: Secondary | ICD-10-CM | POA: Insufficient documentation

## 2015-08-02 DIAGNOSIS — I10 Essential (primary) hypertension: Secondary | ICD-10-CM | POA: Diagnosis not present

## 2015-08-02 DIAGNOSIS — R1013 Epigastric pain: Secondary | ICD-10-CM | POA: Diagnosis not present

## 2015-08-02 DIAGNOSIS — F17210 Nicotine dependence, cigarettes, uncomplicated: Secondary | ICD-10-CM | POA: Diagnosis not present

## 2015-08-02 DIAGNOSIS — R1012 Left upper quadrant pain: Secondary | ICD-10-CM

## 2015-08-02 DIAGNOSIS — Z79899 Other long term (current) drug therapy: Secondary | ICD-10-CM | POA: Insufficient documentation

## 2015-08-02 LAB — COMPREHENSIVE METABOLIC PANEL
ALBUMIN: 4 g/dL (ref 3.5–5.0)
ALBUMIN: 3.8 g/dL (ref 3.5–5.0)
ALK PHOS: 96 U/L (ref 38–126)
ALT: 25 U/L (ref 14–54)
ALT: 23 U/L (ref 14–54)
AST: 28 U/L (ref 15–41)
AST: 31 U/L (ref 15–41)
Alkaline Phosphatase: 101 U/L (ref 38–126)
Anion gap: 8 (ref 5–15)
Anion gap: 8 (ref 5–15)
BILIRUBIN TOTAL: 0.8 mg/dL (ref 0.3–1.2)
BUN: 7 mg/dL (ref 6–20)
BUN: 5 mg/dL — AB (ref 6–20)
CALCIUM: 9.3 mg/dL (ref 8.9–10.3)
CHLORIDE: 101 mmol/L (ref 101–111)
CO2: 25 mmol/L (ref 22–32)
CO2: 26 mmol/L (ref 22–32)
CREATININE: 0.71 mg/dL (ref 0.44–1.00)
Calcium: 9.3 mg/dL (ref 8.9–10.3)
Chloride: 102 mmol/L (ref 101–111)
Creatinine, Ser: 0.78 mg/dL (ref 0.44–1.00)
GFR calc Af Amer: >60 mL/min (ref >60)
GFR calc Af Amer: >60 mL/min (ref >60)
GFR calc non Af Amer: >60 mL/min (ref >60)
GFR calc non Af Amer: >60 mL/min (ref >60)
GLUCOSE: 174 mg/dL — AB (ref 65–99)
GLUCOSE: 145 mg/dL — AB (ref 65–99)
POTASSIUM: 3.6 mmol/L (ref 3.5–5.1)
POTASSIUM: 4.6 mmol/L (ref 3.5–5.1)
SODIUM: 134 mmol/L — AB (ref 135–145)
Sodium: 136 mmol/L (ref 135–145)
TOTAL PROTEIN: 7.6 g/dL (ref 6.5–8.1)
Total Bilirubin: 0.5 mg/dL (ref 0.3–1.2)
Total Protein: 7.6 g/dL (ref 6.5–8.1)

## 2015-08-02 LAB — URINALYSIS, ROUTINE W REFLEX MICROSCOPIC
BILIRUBIN URINE: NEGATIVE
GLUCOSE, UA: NEGATIVE mg/dL
Hgb urine dipstick: NEGATIVE
Ketones, ur: NEGATIVE mg/dL
Leukocytes, UA: NEGATIVE
Nitrite: NEGATIVE
PH: 7.5 (ref 5.0–8.0)
Protein, ur: NEGATIVE mg/dL
SPECIFIC GRAVITY, URINE: 1.018 (ref 1.005–1.030)

## 2015-08-02 LAB — CBC WITH DIFFERENTIAL/PLATELET
BASOS ABS: 0 10*3/uL (ref 0.0–0.1)
BASOS PCT: 0 %
Eosinophils Absolute: 0 10*3/uL (ref 0.0–0.7)
Eosinophils Relative: 0 %
HEMATOCRIT: 40.4 % (ref 36.0–46.0)
HEMOGLOBIN: 13.6 g/dL (ref 12.0–15.0)
LYMPHS PCT: 17 %
Lymphs Abs: 1.7 10*3/uL (ref 0.7–4.0)
MCH: 27.9 pg (ref 26.0–34.0)
MCHC: 33.7 g/dL (ref 30.0–36.0)
MCV: 82.8 fL (ref 78.0–100.0)
Monocytes Absolute: 0.4 10*3/uL (ref 0.1–1.0)
Monocytes Relative: 4 %
NEUTROS ABS: 8.1 10*3/uL — AB (ref 1.7–7.7)
NEUTROS PCT: 79 %
Platelets: 257 10*3/uL (ref 150–400)
RBC: 4.88 MIL/uL (ref 3.87–5.11)
RDW: 14.1 % (ref 11.5–15.5)
WBC: 10.2 10*3/uL (ref 4.0–10.5)

## 2015-08-02 LAB — CBC
HEMATOCRIT: 41.4 % (ref 36.0–46.0)
HEMATOCRIT: 37.9 % (ref 36.0–46.0)
HEMOGLOBIN: 13.7 g/dL (ref 12.0–15.0)
HEMOGLOBIN: 12.6 g/dL (ref 12.0–15.0)
MCH: 27.6 pg (ref 26.0–34.0)
MCH: 27.5 pg (ref 26.0–34.0)
MCHC: 33.1 g/dL (ref 30.0–36.0)
MCHC: 33.2 g/dL (ref 30.0–36.0)
MCV: 83.3 fL (ref 78.0–100.0)
MCV: 82.8 fL (ref 78.0–100.0)
Platelets: 295 10*3/uL (ref 150–400)
Platelets: 288 10*3/uL (ref 150–400)
RBC: 4.97 MIL/uL (ref 3.87–5.11)
RBC: 4.58 MIL/uL (ref 3.87–5.11)
RDW: 14.1 % (ref 11.5–15.5)
RDW: 14.1 % (ref 11.5–15.5)
WBC: 10.4 10*3/uL (ref 4.0–10.5)
WBC: 9.8 10*3/uL (ref 4.0–10.5)

## 2015-08-02 LAB — LIPID PANEL
Cholesterol: 214 mg/dL — ABNORMAL HIGH (ref 0–200)
HDL: 44 mg/dL (ref >40)
LDL Cholesterol: 146 mg/dL — ABNORMAL HIGH (ref 0–99)
TRIGLYCERIDES: 121 mg/dL (ref <150)
Total CHOL/HDL Ratio: 4.9 RATIO
VLDL: 24 mg/dL (ref 0–40)

## 2015-08-02 LAB — CREATININE, SERUM: CREATININE: 0.81 mg/dL (ref 0.44–1.00)

## 2015-08-02 LAB — LIPASE, BLOOD
LIPASE: 103 U/L — AB (ref 11–51)
Lipase: 65 U/L — ABNORMAL HIGH (ref 11–51)

## 2015-08-02 MED ORDER — FOLIC ACID 1 MG PO TABS
1.0000 mg | ORAL_TABLET | Freq: Every day | ORAL | Status: DC
  Administered 2015-08-02 – 2015-08-03 (×2): 1 mg via ORAL
  Filled 2015-08-02 (×2): qty 1

## 2015-08-02 MED ORDER — ONDANSETRON 4 MG PO TBDP
4.0000 mg | ORAL_TABLET | Freq: Once | ORAL | Status: AC | PRN
  Administered 2015-08-02: 4 mg via ORAL

## 2015-08-02 MED ORDER — ADULT MULTIVITAMIN W/MINERALS CH
1.0000 | ORAL_TABLET | Freq: Every day | ORAL | Status: DC
  Administered 2015-08-02 – 2015-08-03 (×2): 1 via ORAL
  Filled 2015-08-02 (×2): qty 1

## 2015-08-02 MED ORDER — ENOXAPARIN SODIUM 40 MG/0.4ML ~~LOC~~ SOLN
40.0000 mg | SUBCUTANEOUS | Status: DC
  Administered 2015-08-02: 40 mg via SUBCUTANEOUS
  Filled 2015-08-02: qty 0.4

## 2015-08-02 MED ORDER — ONDANSETRON HCL 4 MG/2ML IJ SOLN
4.0000 mg | Freq: Once | INTRAMUSCULAR | Status: AC
  Administered 2015-08-02: 4 mg via INTRAVENOUS
  Filled 2015-08-02: qty 2

## 2015-08-02 MED ORDER — HYDROCODONE-ACETAMINOPHEN 5-325 MG PO TABS: 1.0000 | ORAL_TABLET | Freq: Four times a day (QID) | ORAL | 0 refills | Status: DC | PRN

## 2015-08-02 MED ORDER — HYDROMORPHONE HCL 1 MG/ML IJ SOLN
1.0000 mg | Freq: Once | INTRAMUSCULAR | Status: AC
  Administered 2015-08-02: 1 mg via INTRAVENOUS
  Filled 2015-08-02: qty 1

## 2015-08-02 MED ORDER — VITAMIN B-1 100 MG PO TABS
100.0000 mg | ORAL_TABLET | Freq: Every day | ORAL | Status: DC
  Administered 2015-08-02 – 2015-08-03 (×2): 100 mg via ORAL
  Filled 2015-08-02 (×2): qty 1

## 2015-08-02 MED ORDER — HYDROMORPHONE HCL 1 MG/ML IJ SOLN
1.0000 mg | Freq: Once | INTRAMUSCULAR | Status: AC
Start: 2015-08-02 — End: 2015-08-02
  Administered 2015-08-02: 1 mg via INTRAVENOUS
  Filled 2015-08-02: qty 1

## 2015-08-02 MED ORDER — ONDANSETRON HCL 4 MG/2ML IJ SOLN
4.0000 mg | Freq: Four times a day (QID) | INTRAMUSCULAR | Status: DC | PRN
  Filled 2015-08-02: qty 2

## 2015-08-02 MED ORDER — SODIUM CHLORIDE 0.9 % IV BOLUS (SEPSIS)
1000.0000 mL | Freq: Once | INTRAVENOUS | Status: AC
Start: 2015-08-02 — End: 2015-08-02
  Administered 2015-08-02: 1000 mL via INTRAVENOUS

## 2015-08-02 MED ORDER — HYDROMORPHONE HCL 1 MG/ML IJ SOLN
1.0000 mg | INTRAMUSCULAR | Status: DC | PRN
  Administered 2015-08-02: 1 mg via INTRAVENOUS
  Filled 2015-08-02: qty 1

## 2015-08-02 MED ORDER — SODIUM CHLORIDE 0.9 % IV BOLUS (SEPSIS)
1000.0000 mL | Freq: Once | INTRAVENOUS | Status: AC
  Administered 2015-08-02: 1000 mL via INTRAVENOUS

## 2015-08-02 MED ORDER — LORAZEPAM 2 MG/ML IJ SOLN: 1.0000 mg | Freq: Four times a day (QID) | INTRAMUSCULAR | Status: DC | PRN

## 2015-08-02 MED ORDER — LORAZEPAM 1 MG PO TABS: 1.0000 mg | ORAL_TABLET | Freq: Four times a day (QID) | ORAL | Status: DC | PRN

## 2015-08-02 MED ORDER — THIAMINE HCL 100 MG/ML IJ SOLN: 100.0000 mg | Freq: Every day | INTRAMUSCULAR | Status: DC

## 2015-08-02 MED ORDER — HYDROMORPHONE HCL 1 MG/ML IJ SOLN: 1.0000 mg | Freq: Once | INTRAMUSCULAR | Status: DC

## 2015-08-02 MED ORDER — ALBUTEROL SULFATE (2.5 MG/3ML) 0.083% IN NEBU: 3.0000 mL | INHALATION_SOLUTION | Freq: Four times a day (QID) | RESPIRATORY_TRACT | Status: DC | PRN

## 2015-08-02 MED ORDER — HYDROMORPHONE HCL 1 MG/ML IJ SOLN: 1.0000 mg | INTRAMUSCULAR | Status: DC | PRN

## 2015-08-02 MED ORDER — IOPAMIDOL (ISOVUE-300) INJECTION 61%
INTRAVENOUS | Status: AC
  Filled 2015-08-02: qty 100

## 2015-08-02 MED ORDER — MOMETASONE FURO-FORMOTEROL FUM 100-5 MCG/ACT IN AERO
2.0000 | INHALATION_SPRAY | Freq: Two times a day (BID) | RESPIRATORY_TRACT | Status: DC
  Filled 2015-08-02: qty 8.8

## 2015-08-02 MED ORDER — ONDANSETRON 4 MG PO TBDP
ORAL_TABLET | ORAL | Status: AC
  Filled 2015-08-02: qty 1

## 2015-08-02 MED ORDER — METOCLOPRAMIDE HCL 10 MG PO TABS: 10.0000 mg | ORAL_TABLET | Freq: Three times a day (TID) | ORAL | 0 refills | Status: DC | PRN

## 2015-08-02 MED ORDER — SODIUM CHLORIDE 0.9 % IV SOLN
INTRAVENOUS | Status: DC
  Administered 2015-08-03: 01:00:00 via INTRAVENOUS

## 2015-08-02 MED ORDER — ONDANSETRON 4 MG PO TBDP: 4.0000 mg | ORAL_TABLET | Freq: Three times a day (TID) | ORAL | 0 refills | Status: DC | PRN
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
## 2015-08-02 NOTE — ED Notes (Signed)
Report given to [facility] on [facility].  Per family practice MD request pt will stop at CT prior to floor. CT called- pt will be transported by ED tech to CT 1 then to floor.  Floor updated.

## 2015-08-02 NOTE — ED Notes (Signed)
PA at bedside.
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
## 2015-08-02 NOTE — ED Notes (Addendum)
Patient currently vomiting in waiting room. Returned to ED after being discharged one hour ago for same symptoms per patient and family member.  Triage nurse notified and medication administered.

## 2015-08-02 NOTE — ED Triage Notes (Signed)
PT was just seen here today and then discharged home with pancreatitis.  PT states she is now vomiting blood.  Pt still with upper abdominal pain and feels bad.

## 2015-08-02 NOTE — ED Notes (Signed)
Pt. Ambulated to bathroom with minimal assist. Pt. Tolerated well.

## 2015-08-02 NOTE — ED Notes (Signed)
Unable to get pt temp in triage due to pt just having some sprite

## 2015-08-02 NOTE — ED Triage Notes (Addendum)
Pt. arrived with PTAR from home reports left upper abdominal pain with nausea / emesis . Denies diarrhea / no fever or chills. Pt.'s family became verbally aggressive with nurse during triage - nurse explained that pt. will be move to next available bed as soon as possible , updated on wait time/ process .

## 2015-08-02 NOTE — ED Provider Notes (Signed)
Pt given to me a shift change with PO challenge pending.  Pt seen and evaluated in ER by Charlann Lange, PA-C, dx with acute on chronic pancreatitis, pt given pain medication and antiemetic in the ED.  Will likely discharge home if able to tolerate fluids.  7:56 AM Pt tolerating fluids and reports she is ready to go home.   Will discharge home with zofran, reglan and norco, to follow up with PCP as needed, return precautions reviewed. Pt well appearing, VSS at the time of discharge.  Results for orders placed or performed during the hospital encounter of 08/02/15  Lipase, blood  Result Value Ref Range   Lipase 103 (H) 11 - 51 U/L  Comprehensive metabolic panel  Result Value Ref Range   Sodium 134 (L) 135 - 145 mmol/L   Potassium 3.6 3.5 - 5.1 mmol/L   Chloride 101 101 - 111 mmol/L   CO2 25 22 - 32 mmol/L   Glucose, Bld 174 (H) 65 - 99 mg/dL   BUN 7 6 - 20 mg/dL   Creatinine, Ser 0.78 0.44 - 1.00 mg/dL   Calcium 9.3 8.9 - 10.3 mg/dL   Total Protein 7.6 6.5 - 8.1 g/dL   Albumin 4.0 3.5 - 5.0 g/dL   AST 28 15 - 41 U/L   ALT 25 14 - 54 U/L   Alkaline Phosphatase 101 38 - 126 U/L   Total Bilirubin 0.5 0.3 - 1.2 mg/dL   GFR calc non Af Amer >60 >60 mL/min   GFR calc Af Amer >60 >60 mL/min   Anion gap 8 5 - 15  CBC  Result Value Ref Range   WBC 10.4 4.0 - 10.5 K/uL   RBC 4.97 3.87 - 5.11 MIL/uL   Hemoglobin 13.7 12.0 - 15.0 g/dL   HCT 41.4 36.0 - 46.0 %   MCV 83.3 78.0 - 100.0 fL   MCH 27.6 26.0 - 34.0 pg   MCHC 33.1 30.0 - 36.0 g/dL   RDW 14.1 11.5 - 15.5 %   Platelets 295 150 - 400 K/uL  Urinalysis, Routine w reflex microscopic  Result Value Ref Range   Color, Urine YELLOW YELLOW   APPearance CLEAR CLEAR   Specific Gravity, Urine 1.018 1.005 - 1.030   pH 7.5 5.0 - 8.0   Glucose, UA NEGATIVE NEGATIVE mg/dL   Hgb urine dipstick NEGATIVE NEGATIVE   Bilirubin Urine NEGATIVE NEGATIVE   Ketones, ur NEGATIVE NEGATIVE mg/dL   Protein, ur NEGATIVE NEGATIVE mg/dL   Nitrite  NEGATIVE NEGATIVE   Leukocytes, UA NEGATIVE NEGATIVE   No results found.     Delsa Grana, PA-C 08/02/15 [phone]
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
## 2015-08-02 NOTE — ED Notes (Signed)
Family at bedside. Son 

## 2015-08-03 LAB — BASIC METABOLIC PANEL
Anion gap: 6 (ref 5–15)
BUN: 6 mg/dL (ref 6–20)
CALCIUM: 8.9 mg/dL (ref 8.9–10.3)
CO2: 27 mmol/L (ref 22–32)
CREATININE: 0.81 mg/dL (ref 0.44–1.00)
Chloride: 102 mmol/L (ref 101–111)
GFR calc Af Amer: >60 mL/min (ref >60)
Glucose, Bld: 119 mg/dL — ABNORMAL HIGH (ref 65–99)
POTASSIUM: 3.5 mmol/L (ref 3.5–5.1)
SODIUM: 135 mmol/L (ref 135–145)

## 2015-08-03 MED ORDER — MORPHINE SULFATE (PF) 2 MG/ML IV SOLN: 2.0000 mg | INTRAVENOUS | Status: DC | PRN

## 2015-08-03 NOTE — Progress Notes (Signed)
Holly Mendez to be D/C'd Home per MD order.  Discussed prescriptions and follow up appointments with the patient. Prescriptions given to patient, medication list explained in detail. Pt verbalized understanding.    Medication List    TAKE these medications   albuterol 108 (90 Base) MCG/ACT inhaler Commonly known as:  PROVENTIL HFA;VENTOLIN HFA Inhale 2 puffs into the lungs every 6 (six) hours as needed for wheezing or shortness of breath.   amitriptyline 25 MG tablet Commonly known as:  ELAVIL Take on to three tabs at bedtime for back pain What changed:  how much to take  how to take this  when to take this  reasons to take this  additional instructions   cetirizine 10 MG tablet Commonly known as:  ZYRTEC Take 1 tablet (10 mg total) by mouth daily. What changed:  when to take this  reasons to take this   fluticasone 50 MCG/ACT nasal spray Commonly known as:  FLONASE Place 2 sprays into both nostrils daily as needed for allergies or rhinitis. What changed:  reasons to take this   HYDROcodone-acetaminophen 5-325 MG tablet Commonly known as:  NORCO/VICODIN Take 1-2 tablets by mouth every 6 (six) hours as needed for severe pain.   metoCLOPramide 10 MG tablet Commonly known as:  REGLAN Take 1 tablet (10 mg total) by mouth 3 (three) times daily as needed for nausea or refractory nausea / vomiting.   ondansetron 4 MG disintegrating tablet Commonly known as:  ZOFRAN ODT Take 1 tablet (4 mg total) by mouth every 8 (eight) hours as needed for nausea or vomiting.   SYMBICORT 80-4.5 MCG/ACT inhaler Generic drug:  budesonide-formoterol Inhale 2 puffs into the lungs 2 (two) times daily. What changed:  when to take this  reasons to take this   tizanidine 2 MG capsule Commonly known as:  ZANAFLEX Take 1 capsule (2 mg total) by mouth 2 (two) times daily as needed for muscle spasms.       Vitals:   08/03/15 0436 08/03/15 0945  BP: 132/73 124/82  Pulse: 89 89   Resp: 20 20  Temp: 98 F (36.7 C) 98.5 F (36.9 C)    Skin clean, dry and intact without evidence of skin break down, no evidence of skin tears noted. IV catheter discontinued intact. Site without signs and symptoms of complications. Dressing and pressure applied. Pt denies pain at this time. No complaints noted.  An After Visit Summary was printed and given to the patient. Patient ambulated off unit, and D/C home via private auto.  Retta Mac BSN, RN

## 2015-08-03 NOTE — Discharge Instructions (Signed)
You were admitted for pancreatitis likely due to alcohol use. It is important NOT to drink alcohol, as this can cause another episode of pancreatitis. Please schedule an appointment with [facility] as you mentioned for grief counseling. It is also important to attend your follow-up appointment with Dr. Nori Riis as scheduled.

## 2015-08-03 NOTE — Discharge Summary (Signed)
[facility] Discharge Summary  Patient name: Holly Mendez Medical record number: [id number] Date of birth: Apr 03, 1975 Age: 40 y.o. Gender: female Date of Admission: 08/02/2015  Date of Discharge: 08/03/15 Admitting Physician: Holly Resides, MD  Primary Care Provider: [name], MD Consultants: None  Indication for Hospitalization: Pancreatitis  Discharge Diagnoses/Problem List:  Patient Active Problem List   Diagnosis Date Noted  . Pancreatitis 08/02/2015  . Back pain, chronic 12/22/2014  . Blind left eye 12/10/2014  . Possiblle Anterior communicating artery aneurysm 10/30/2014  . S/P endometrial ablation 10/17/2014  . Hyperglycemia 10/17/2014  . Morbid obesity ([place]) 09/24/2014  . Headache   . Meningitis, hx, 2016 09/21/2014  . Bipolar affective disorder, currently in remission ([place]) 02/04/2011  . HTN (hypertension) 02/04/2011     Disposition: Home  Discharge Condition: Stable, improved   Discharge Exam:  General: lying in bed, appears groggy and uncomfortable Eyes: pupils constricted in the setting of recent dilaudid dose in ED, R eye PERRL. L eye has post traumatic changes.  ENTM: dry mucous membrane Cardiovascular: RRR, no murmurs appreciated. <3 sec cap refill.2+ DPs Respiratory: CTAB, normal effort Abdomen: TTP over mid epigastric area with rebound but no guarding, +bs, soft, nd.  MSK: moving limbs spontaneously Skin: no rashes noted Neuro: groggy but arousable, no focal deficits Psych: appropriate affect   Brief Hospital Course:  Holly Mendez is a 40 y.o. female presenting with nausea/vomiting. PMH is significant for chronic pancreatitis, HTN, HLD, GERD, fibromyalgia, bipolar disorder, asthma, obesity, probable anterior communicating artery aneurysm.  Abdominal pain likely acute on chronic pancreatitis vs GERD. Patient presented with abdominal pain and a lipase 106 in ED, that trended down to 65.  Stated that she has a  history of alcohol abuse and has been sober for 7 years, but recently had a death in the family and has been binge drinking for the past weekend. She had been afebrile, midepigastric pain with mild rebound no guarding. CT abd/pelvis w/contrast shows no mass/inflammatory changes. States last pancreatitis flare was in 2013 but imaging from that time shows no acute findings.  She was admitted under observation and placed on NPO and was given NS@160cc /hr and her pain was initially controlled with dilauded 1mg  q2hr prn and zofran for nausea.  She needed little pain medication over night.  Despite our order for NPO, her husband brought her breakfast, which she ate without any issues.  She felt much improved this morning and was scoring zero on CIWA.  Due to her long recent history of sobriety and that this was a binge we are not concerned for acute withdrawal at this time.   Issues for Follow Up:  1. Alcohol abuse: Patient had a binge in the wake of her brother's recent murder.  Not concerned for acute withdrawal, but patient needs follow up with primary doctor to discuss coping strategies.  Appears to have a very supportive husband who is a non-drinker.   2. Hyperlipidemia: Patient had elevated cholesterol anld likely push her over to the >7.5% 10-year ASCVD risk.  d LDL to 214 and 146.  Not quite at the mark for needing a statin, but patient should be screened for diabetes, as this wou 3. H/o Anterior communicating artery aneurysm:  Patient had a finding of a anterior communicating artery aneurysm in 2016 and it was recommended that she follow up with a CTA, which she has not done.  We recommend that she follow up and receive this study.  Significant Procedures: None Significant Labs and Imaging:   Recent Labs Lab 08/02/15 0502 08/02/15 1501 08/02/15 1901  WBC 10.4 10.2 9.8  HGB 13.7 13.6 12.6  HCT 41.4 40.4 37.9  PLT 295 257 288    Recent Labs Lab 08/02/15 0502 08/02/15 1501 08/02/15 1901  08/03/15 0604  NA 134* 136  --  135  K 3.6 4.6  --  3.5  CL 101 102  --  102  CO2 25 26  --  27  GLUCOSE 174* 145*  --  119*  BUN 7 5*  --  6  CREATININE 0.78 0.71 0.81 0.81  CALCIUM 9.3 9.3  --  8.9  ALKPHOS 101 96  --   --   AST 28 31  --   --   ALT 25 23  --   --   ALBUMIN 4.0 3.8  --   --     Ct Abdomen Pelvis W Contrast  Result Date: 08/02/2015 CLINICAL DATA:  Left upper abdominal pain EXAM: CT ABDOMEN AND PELVIS WITH CONTRAST TECHNIQUE: Multidetector CT imaging of the abdomen and pelvis was performed using the standard protocol following bolus administration of intravenous contrast. CONTRAST:  100 mL Isovue-300 COMPARISON:  02/06/2011 FINDINGS: Lower chest:  No acute findings. Hepatobiliary: No masses or other significant abnormality. Pancreas: No mass, inflammatory changes, or other significant abnormality. Spleen: Within normal limits in size and appearance. Adrenals/Urinary Tract: No masses identified. No evidence of hydronephrosis. Stomach/Bowel: No evidence of obstruction, inflammatory process, or abnormal fluid collections. Vascular/Lymphatic: No pathologically enlarged lymph nodes. No evidence of abdominal aortic aneurysm. Reproductive: The uterus and ovaries appear within normal limits. Some fluid is noted within the vaginal wall adjacent to the cervix. Other: None. Musculoskeletal:  No suspicious bone lesions identified. IMPRESSION: Small amount of fluid is noted within the vaginal vault adjacent to the cervix. No acute abnormality to correspond with the patient's clinical symptomatology is noted. Electronically Signed   By: Inez Catalina M.D.   On: 08/02/2015 18:43   Results/Tests Pending at Time of Discharge: None  Discharge Medications:    Medication List    TAKE these medications   albuterol 108 (90 Base) MCG/ACT inhaler Commonly known as:  PROVENTIL HFA;VENTOLIN HFA Inhale 2 puffs into the lungs every 6 (six) hours as needed for wheezing or shortness of breath.    amitriptyline 25 MG tablet Commonly known as:  ELAVIL Take on to three tabs at bedtime for back pain What changed:  how much to take  how to take this  when to take this  reasons to take this  additional instructions   cetirizine 10 MG tablet Commonly known as:  ZYRTEC Take 1 tablet (10 mg total) by mouth daily. What changed:  when to take this  reasons to take this   fluticasone 50 MCG/ACT nasal spray Commonly known as:  FLONASE Place 2 sprays into both nostrils daily as needed for allergies or rhinitis. What changed:  reasons to take this   HYDROcodone-acetaminophen 5-325 MG tablet Commonly known as:  NORCO/VICODIN Take 1-2 tablets by mouth every 6 (six) hours as needed for severe pain.   metoCLOPramide 10 MG tablet Commonly known as:  REGLAN Take 1 tablet (10 mg total) by mouth 3 (three) times daily as needed for nausea or refractory nausea / vomiting.   ondansetron 4 MG disintegrating tablet Commonly known as:  ZOFRAN ODT Take 1 tablet (4 mg total) by mouth every 8 (eight) hours as needed for nausea or vomiting.  SYMBICORT 80-4.5 MCG/ACT inhaler Generic drug:  budesonide-formoterol Inhale 2 puffs into the lungs 2 (two) times daily. What changed:  when to take this  reasons to take this   tizanidine 2 MG capsule Commonly known as:  ZANAFLEX Take 1 capsule (2 mg total) by mouth 2 (two) times daily as needed for muscle spasms.       Discharge Instructions: Please refer to Patient Instructions section of EMR for full details.  Patient was counseled important signs and symptoms that should prompt return to medical care, changes in medications, dietary instructions, activity restrictions, and follow up appointments.   Follow-Up Appointments:   Eloise Levels, MD 08/03/2015, 9:15 PM PGY-1, [place]

## 2015-08-04 ENCOUNTER — Observation Stay (HOSPITAL_COMMUNITY)
Admission: EM | Admit: 2015-08-04 | Discharge: 2015-08-05 | Disposition: A | Discharge status: Home / Self Care | Payer: Medicaid Other | Attending: Family Medicine | Admitting: Family Medicine

## 2015-08-04 ENCOUNTER — Encounter (HOSPITAL_COMMUNITY): Payer: Self-pay

## 2015-08-04 DIAGNOSIS — R739 Hyperglycemia, unspecified: Secondary | ICD-10-CM | POA: Diagnosis not present

## 2015-08-04 DIAGNOSIS — F3170 Bipolar disorder, currently in remission, most recent episode unspecified: Secondary | ICD-10-CM | POA: Diagnosis not present

## 2015-08-04 DIAGNOSIS — K589 Irritable bowel syndrome without diarrhea: Secondary | ICD-10-CM | POA: Diagnosis not present

## 2015-08-04 DIAGNOSIS — R1013 Epigastric pain: Secondary | ICD-10-CM

## 2015-08-04 DIAGNOSIS — R1010 Upper abdominal pain, unspecified: Secondary | ICD-10-CM | POA: Diagnosis not present

## 2015-08-04 DIAGNOSIS — Z7951 Long term (current) use of inhaled steroids: Secondary | ICD-10-CM | POA: Diagnosis not present

## 2015-08-04 DIAGNOSIS — E785 Hyperlipidemia, unspecified: Secondary | ICD-10-CM | POA: Insufficient documentation

## 2015-08-04 DIAGNOSIS — I671 Cerebral aneurysm, nonruptured: Secondary | ICD-10-CM | POA: Diagnosis not present

## 2015-08-04 DIAGNOSIS — F17210 Nicotine dependence, cigarettes, uncomplicated: Secondary | ICD-10-CM | POA: Diagnosis not present

## 2015-08-04 DIAGNOSIS — K219 Gastro-esophageal reflux disease without esophagitis: Secondary | ICD-10-CM | POA: Insufficient documentation

## 2015-08-04 DIAGNOSIS — K859 Acute pancreatitis without necrosis or infection, unspecified: Secondary | ICD-10-CM | POA: Diagnosis present

## 2015-08-04 DIAGNOSIS — R112 Nausea with vomiting, unspecified: Secondary | ICD-10-CM | POA: Insufficient documentation

## 2015-08-04 DIAGNOSIS — K861 Other chronic pancreatitis: Secondary | ICD-10-CM | POA: Diagnosis not present

## 2015-08-04 DIAGNOSIS — J45909 Unspecified asthma, uncomplicated: Secondary | ICD-10-CM | POA: Diagnosis not present

## 2015-08-04 DIAGNOSIS — R1110 Vomiting, unspecified: Secondary | ICD-10-CM

## 2015-08-04 DIAGNOSIS — Z6841 Body Mass Index (BMI) 40.0 and over, adult: Secondary | ICD-10-CM | POA: Diagnosis not present

## 2015-08-04 DIAGNOSIS — Z79899 Other long term (current) drug therapy: Secondary | ICD-10-CM | POA: Diagnosis not present

## 2015-08-04 DIAGNOSIS — I10 Essential (primary) hypertension: Secondary | ICD-10-CM | POA: Diagnosis not present

## 2015-08-04 DIAGNOSIS — K5900 Constipation, unspecified: Secondary | ICD-10-CM | POA: Insufficient documentation

## 2015-08-04 DIAGNOSIS — G8929 Other chronic pain: Secondary | ICD-10-CM | POA: Diagnosis present

## 2015-08-04 DIAGNOSIS — H5442 Blindness, left eye, normal vision right eye: Secondary | ICD-10-CM | POA: Insufficient documentation

## 2015-08-04 LAB — COMPREHENSIVE METABOLIC PANEL
ALBUMIN: 4.5 g/dL (ref 3.5–5.0)
ALT: 38 U/L (ref 14–54)
ANION GAP: 9 (ref 5–15)
AST: 40 U/L (ref 15–41)
Alkaline Phosphatase: 89 U/L (ref 38–126)
BILIRUBIN TOTAL: 0.8 mg/dL (ref 0.3–1.2)
BUN: 6 mg/dL (ref 6–20)
CHLORIDE: 99 mmol/L — AB (ref 101–111)
CO2: 26 mmol/L (ref 22–32)
Calcium: 9.2 mg/dL (ref 8.9–10.3)
Creatinine, Ser: 0.79 mg/dL (ref 0.44–1.00)
GFR calc Af Amer: >60 mL/min (ref >60)
GFR calc non Af Amer: >60 mL/min (ref >60)
GLUCOSE: 145 mg/dL — AB (ref 65–99)
POTASSIUM: 3.6 mmol/L (ref 3.5–5.1)
SODIUM: 134 mmol/L — AB (ref 135–145)
TOTAL PROTEIN: 8.4 g/dL — AB (ref 6.5–8.1)

## 2015-08-04 LAB — LIPASE, BLOOD: Lipase: 20 U/L (ref 11–51)

## 2015-08-04 LAB — CBC WITH DIFFERENTIAL/PLATELET
BASOS PCT: 0 %
Basophils Absolute: 0 10*3/uL (ref 0.0–0.1)
Eosinophils Absolute: 0 10*3/uL (ref 0.0–0.7)
Eosinophils Relative: 0 %
HEMATOCRIT: 39.4 % (ref 36.0–46.0)
HEMOGLOBIN: 13.5 g/dL (ref 12.0–15.0)
LYMPHS ABS: 1.9 10*3/uL (ref 0.7–4.0)
LYMPHS PCT: 18 %
MCH: 28.1 pg (ref 26.0–34.0)
MCHC: 34.3 g/dL (ref 30.0–36.0)
MCV: 82.1 fL (ref 78.0–100.0)
MONO ABS: 0.5 10*3/uL (ref 0.1–1.0)
MONOS PCT: 4 %
NEUTROS ABS: 8.2 10*3/uL — AB (ref 1.7–7.7)
NEUTROS PCT: 78 %
Platelets: 299 10*3/uL (ref 150–400)
RBC: 4.8 MIL/uL (ref 3.87–5.11)
RDW: 14.4 % (ref 11.5–15.5)
WBC: 10.6 10*3/uL — ABNORMAL HIGH (ref 4.0–10.5)

## 2015-08-04 LAB — ETHANOL: Alcohol, Ethyl (B): <5 mg/dL (ref <5)

## 2015-08-04 MED ORDER — LORAZEPAM 2 MG/ML IJ SOLN: 1.0000 mg | Freq: Four times a day (QID) | INTRAMUSCULAR | Status: DC | PRN

## 2015-08-04 MED ORDER — LORAZEPAM 1 MG PO TABS: 1.0000 mg | ORAL_TABLET | Freq: Four times a day (QID) | ORAL | Status: DC | PRN

## 2015-08-04 MED ORDER — ONDANSETRON HCL 4 MG/2ML IJ SOLN
4.0000 mg | Freq: Once | INTRAMUSCULAR | Status: AC
  Administered 2015-08-04: 4 mg via INTRAVENOUS
  Filled 2015-08-04: qty 2

## 2015-08-04 MED ORDER — HYDROMORPHONE HCL 1 MG/ML IJ SOLN
1.0000 mg | Freq: Once | INTRAMUSCULAR | Status: AC
  Administered 2015-08-04: 1 mg via INTRAVENOUS
  Filled 2015-08-04: qty 1

## 2015-08-04 MED ORDER — ONDANSETRON HCL 4 MG/2ML IJ SOLN
4.0000 mg | Freq: Four times a day (QID) | INTRAMUSCULAR | Status: DC | PRN
  Administered 2015-08-04 – 2015-08-05 (×3): 4 mg via INTRAVENOUS
  Filled 2015-08-04 (×3): qty 2

## 2015-08-04 MED ORDER — MORPHINE SULFATE (PF) 4 MG/ML IV SOLN
4.0000 mg | INTRAVENOUS | Status: DC | PRN
  Administered 2015-08-04 – 2015-08-05 (×4): 4 mg via INTRAVENOUS
  Filled 2015-08-04 (×4): qty 1

## 2015-08-04 MED ORDER — ALBUTEROL SULFATE (2.5 MG/3ML) 0.083% IN NEBU: 2.5000 mg | INHALATION_SOLUTION | Freq: Four times a day (QID) | RESPIRATORY_TRACT | Status: DC | PRN

## 2015-08-04 MED ORDER — THIAMINE HCL 100 MG/ML IJ SOLN
100.0000 mg | Freq: Every day | INTRAMUSCULAR | Status: DC
  Administered 2015-08-04: 100 mg via INTRAVENOUS
  Filled 2015-08-04: qty 2

## 2015-08-04 MED ORDER — KCL IN DEXTROSE-NACL 20-5-0.45 MEQ/L-%-% IV SOLN
INTRAVENOUS | Status: DC
  Administered 2015-08-04: 16:00:00 via INTRAVENOUS
  Filled 2015-08-04 (×2): qty 1000

## 2015-08-04 MED ORDER — ONDANSETRON HCL 4 MG PO TABS: 4.0000 mg | ORAL_TABLET | Freq: Four times a day (QID) | ORAL | Status: DC | PRN

## 2015-08-04 MED ORDER — HYDROMORPHONE HCL 1 MG/ML IJ SOLN
1.0000 mg | Freq: Once | INTRAMUSCULAR | Status: DC
  Filled 2015-08-04: qty 1

## 2015-08-04 MED ORDER — SODIUM CHLORIDE 0.9 % IV SOLN
1000.0000 mL | Freq: Once | INTRAVENOUS | Status: AC
  Administered 2015-08-04: 1000 mL via INTRAVENOUS

## 2015-08-04 MED ORDER — MOMETASONE FURO-FORMOTEROL FUM 100-5 MCG/ACT IN AERO
2.0000 | INHALATION_SPRAY | Freq: Two times a day (BID) | RESPIRATORY_TRACT | Status: DC
  Filled 2015-08-04: qty 8.8

## 2015-08-04 MED ORDER — HYDROCODONE-ACETAMINOPHEN 5-325 MG PO TABS: 1.0000 | ORAL_TABLET | Freq: Once | ORAL | Status: DC

## 2015-08-04 MED ORDER — FOLIC ACID 1 MG PO TABS
1.0000 mg | ORAL_TABLET | Freq: Every day | ORAL | Status: DC
  Administered 2015-08-04 – 2015-08-05 (×2): 1 mg via ORAL
  Filled 2015-08-04 (×2): qty 1

## 2015-08-04 MED ORDER — ENOXAPARIN SODIUM 40 MG/0.4ML ~~LOC~~ SOLN
40.0000 mg | SUBCUTANEOUS | Status: DC
  Administered 2015-08-04: 40 mg via SUBCUTANEOUS
  Filled 2015-08-04: qty 0.4

## 2015-08-04 MED ORDER — VITAMIN B-1 100 MG PO TABS
100.0000 mg | ORAL_TABLET | Freq: Every day | ORAL | Status: DC
  Administered 2015-08-05: 100 mg via ORAL
  Filled 2015-08-04: qty 1

## 2015-08-04 MED ORDER — SODIUM CHLORIDE 0.9 % IV SOLN: INTRAVENOUS | Status: DC

## 2015-08-04 MED ORDER — ADULT MULTIVITAMIN W/MINERALS CH
1.0000 | ORAL_TABLET | Freq: Every day | ORAL | Status: DC
  Administered 2015-08-04 – 2015-08-05 (×2): 1 via ORAL
  Filled 2015-08-04 (×2): qty 1

## 2015-08-04 MED ORDER — ONDANSETRON HCL 4 MG/2ML IJ SOLN
4.0000 mg | Freq: Once | INTRAMUSCULAR | Status: DC
  Filled 2015-08-04: qty 2

## 2015-08-04 MED ORDER — SODIUM CHLORIDE 0.9 % IV SOLN
1000.0000 mL | INTRAVENOUS | Status: DC
  Administered 2015-08-04 (×2): 1000 mL via INTRAVENOUS
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
## 2015-08-04 NOTE — ED Notes (Signed)
Pt complains of abdominal pain due to her pancreatitis, was seen at [facility] yesterday and started vomiting this am again.

## 2015-08-04 NOTE — ED Notes (Signed)
4 mg zofran and 30mcg fentanyl given by EMS in route

## 2015-08-05 DIAGNOSIS — R1013 Epigastric pain: Secondary | ICD-10-CM | POA: Diagnosis not present

## 2015-08-05 DIAGNOSIS — I671 Cerebral aneurysm, nonruptured: Secondary | ICD-10-CM | POA: Diagnosis not present

## 2015-08-05 DIAGNOSIS — K859 Acute pancreatitis without necrosis or infection, unspecified: Secondary | ICD-10-CM | POA: Diagnosis not present

## 2015-08-05 DIAGNOSIS — R112 Nausea with vomiting, unspecified: Secondary | ICD-10-CM | POA: Diagnosis not present

## 2015-08-05 LAB — CBC
HCT: 40.3 % (ref 36.0–46.0)
Hemoglobin: 13.2 g/dL (ref 12.0–15.0)
MCH: 27.3 pg (ref 26.0–34.0)
MCHC: 32.8 g/dL (ref 30.0–36.0)
MCV: 83.4 fL (ref 78.0–100.0)
PLATELETS: 283 10*3/uL (ref 150–400)
RBC: 4.83 MIL/uL (ref 3.87–5.11)
RDW: 14.3 % (ref 11.5–15.5)
WBC: 12 10*3/uL — AB (ref 4.0–10.5)

## 2015-08-05 LAB — COMPREHENSIVE METABOLIC PANEL
ALT: 30 U/L (ref 14–54)
AST: 32 U/L (ref 15–41)
Albumin: 3.5 g/dL (ref 3.5–5.0)
Alkaline Phosphatase: 80 U/L (ref 38–126)
Anion gap: 7 (ref 5–15)
BILIRUBIN TOTAL: 0.7 mg/dL (ref 0.3–1.2)
CHLORIDE: 100 mmol/L — AB (ref 101–111)
CO2: 26 mmol/L (ref 22–32)
CREATININE: 0.87 mg/dL (ref 0.44–1.00)
Calcium: 8.9 mg/dL (ref 8.9–10.3)
GFR calc Af Amer: >60 mL/min (ref >60)
GLUCOSE: 132 mg/dL — AB (ref 65–99)
Potassium: 3.2 mmol/L — ABNORMAL LOW (ref 3.5–5.1)
Sodium: 133 mmol/L — ABNORMAL LOW (ref 135–145)
TOTAL PROTEIN: 7.2 g/dL (ref 6.5–8.1)

## 2015-08-05 LAB — HEMOGLOBIN A1C
Hgb A1c MFr Bld: 6.3 % — ABNORMAL HIGH (ref 4.8–5.6)
Mean Plasma Glucose: 134 mg/dL

## 2015-08-05 NOTE — Discharge Instructions (Signed)
You were admitted to the hospital for nausea and vomiting that could have been due to pancreatitis, but also likely related to stress and your irritable bowel syndrome.  We have limited your food intake overnight and you tolerated liquids well and then had a full lunch without issues.  We feel that you have improved well enough to be discharged.  Please follow up with your primary doctor and also with your therapist to see if you can restart some of the medication that you mentioned helped you cope with stress in the past.

## 2015-08-05 NOTE — Progress Notes (Signed)
Pt verbaly understood discharge paper and recommendations.
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
## 2015-08-06 ENCOUNTER — Inpatient Hospital Stay (HOSPITAL_COMMUNITY)
Admission: EM | Admit: 2015-08-06 | Discharge: 2015-08-10 | DRG: 392 | Disposition: A | Discharge status: Home / Self Care | Payer: Medicaid Other | Attending: Family Medicine | Admitting: Family Medicine

## 2015-08-06 ENCOUNTER — Emergency Department (HOSPITAL_COMMUNITY): Payer: Medicaid Other

## 2015-08-06 ENCOUNTER — Encounter (HOSPITAL_COMMUNITY): Payer: Self-pay

## 2015-08-06 ENCOUNTER — Other Ambulatory Visit: Payer: Self-pay | Admitting: *Deleted

## 2015-08-06 DIAGNOSIS — H5442 Blindness, left eye, normal vision right eye: Secondary | ICD-10-CM | POA: Diagnosis present

## 2015-08-06 DIAGNOSIS — A499 Bacterial infection, unspecified: Secondary | ICD-10-CM | POA: Diagnosis not present

## 2015-08-06 DIAGNOSIS — G8929 Other chronic pain: Secondary | ICD-10-CM | POA: Diagnosis present

## 2015-08-06 DIAGNOSIS — K219 Gastro-esophageal reflux disease without esophagitis: Secondary | ICD-10-CM | POA: Diagnosis present

## 2015-08-06 DIAGNOSIS — B9689 Other specified bacterial agents as the cause of diseases classified elsewhere: Secondary | ICD-10-CM | POA: Insufficient documentation

## 2015-08-06 DIAGNOSIS — R1110 Vomiting, unspecified: Secondary | ICD-10-CM | POA: Diagnosis present

## 2015-08-06 DIAGNOSIS — K829 Disease of gallbladder, unspecified: Secondary | ICD-10-CM | POA: Diagnosis present

## 2015-08-06 DIAGNOSIS — K5900 Constipation, unspecified: Secondary | ICD-10-CM | POA: Insufficient documentation

## 2015-08-06 DIAGNOSIS — Z6841 Body Mass Index (BMI) 40.0 and over, adult: Secondary | ICD-10-CM

## 2015-08-06 DIAGNOSIS — E785 Hyperlipidemia, unspecified: Secondary | ICD-10-CM | POA: Diagnosis present

## 2015-08-06 DIAGNOSIS — N760 Acute vaginitis: Secondary | ICD-10-CM | POA: Diagnosis not present

## 2015-08-06 DIAGNOSIS — M797 Fibromyalgia: Secondary | ICD-10-CM | POA: Diagnosis present

## 2015-08-06 DIAGNOSIS — K861 Other chronic pancreatitis: Secondary | ICD-10-CM | POA: Diagnosis present

## 2015-08-06 DIAGNOSIS — R7303 Prediabetes: Secondary | ICD-10-CM | POA: Diagnosis present

## 2015-08-06 DIAGNOSIS — E876 Hypokalemia: Secondary | ICD-10-CM

## 2015-08-06 DIAGNOSIS — R112 Nausea with vomiting, unspecified: Secondary | ICD-10-CM | POA: Diagnosis present

## 2015-08-06 DIAGNOSIS — J45909 Unspecified asthma, uncomplicated: Secondary | ICD-10-CM | POA: Diagnosis present

## 2015-08-06 DIAGNOSIS — F1010 Alcohol abuse, uncomplicated: Secondary | ICD-10-CM | POA: Diagnosis present

## 2015-08-06 DIAGNOSIS — R739 Hyperglycemia, unspecified: Secondary | ICD-10-CM | POA: Diagnosis present

## 2015-08-06 DIAGNOSIS — K589 Irritable bowel syndrome without diarrhea: Secondary | ICD-10-CM | POA: Diagnosis present

## 2015-08-06 DIAGNOSIS — E7800 Pure hypercholesterolemia, unspecified: Secondary | ICD-10-CM | POA: Diagnosis present

## 2015-08-06 DIAGNOSIS — F259 Schizoaffective disorder, unspecified: Secondary | ICD-10-CM | POA: Diagnosis present

## 2015-08-06 DIAGNOSIS — F3170 Bipolar disorder, currently in remission, most recent episode unspecified: Secondary | ICD-10-CM | POA: Diagnosis present

## 2015-08-06 DIAGNOSIS — F17210 Nicotine dependence, cigarettes, uncomplicated: Secondary | ICD-10-CM | POA: Diagnosis present

## 2015-08-06 DIAGNOSIS — R1013 Epigastric pain: Secondary | ICD-10-CM | POA: Diagnosis present

## 2015-08-06 DIAGNOSIS — I10 Essential (primary) hypertension: Secondary | ICD-10-CM | POA: Diagnosis present

## 2015-08-06 DIAGNOSIS — I671 Cerebral aneurysm, nonruptured: Secondary | ICD-10-CM | POA: Diagnosis present

## 2015-08-06 LAB — RAPID URINE DRUG SCREEN, HOSP PERFORMED
AMPHETAMINES: NOT DETECTED
Barbiturates: NOT DETECTED
Benzodiazepines: NOT DETECTED
Cocaine: NOT DETECTED
OPIATES: POSITIVE — AB
Tetrahydrocannabinol: NOT DETECTED

## 2015-08-06 LAB — COMPREHENSIVE METABOLIC PANEL
ALT: 42 U/L (ref 14–54)
AST: 42 U/L — AB (ref 15–41)
Albumin: 4.6 g/dL (ref 3.5–5.0)
Alkaline Phosphatase: 93 U/L (ref 38–126)
Anion gap: 12 (ref 5–15)
BILIRUBIN TOTAL: 0.9 mg/dL (ref 0.3–1.2)
CHLORIDE: 96 mmol/L — AB (ref 101–111)
CO2: 22 mmol/L (ref 22–32)
CREATININE: 0.9 mg/dL (ref 0.44–1.00)
Calcium: 9.8 mg/dL (ref 8.9–10.3)
Glucose, Bld: 147 mg/dL — ABNORMAL HIGH (ref 65–99)
Potassium: 4.3 mmol/L (ref 3.5–5.1)
Sodium: 130 mmol/L — ABNORMAL LOW (ref 135–145)
TOTAL PROTEIN: 8.2 g/dL — AB (ref 6.5–8.1)

## 2015-08-06 LAB — WET PREP, GENITAL
SPERM: NONE SEEN
TRICH WET PREP: NONE SEEN
YEAST WET PREP: NONE SEEN

## 2015-08-06 LAB — URINALYSIS, ROUTINE W REFLEX MICROSCOPIC
Bilirubin Urine: NEGATIVE
GLUCOSE, UA: NEGATIVE mg/dL
Hgb urine dipstick: NEGATIVE
KETONES UR: 40 mg/dL — AB
LEUKOCYTES UA: NEGATIVE
NITRITE: NEGATIVE
PROTEIN: NEGATIVE mg/dL
Specific Gravity, Urine: 1.019 (ref 1.005–1.030)
pH: 8.5 — ABNORMAL HIGH (ref 5.0–8.0)

## 2015-08-06 LAB — CBC
HCT: 43.7 % (ref 36.0–46.0)
Hemoglobin: 15 g/dL (ref 12.0–15.0)
MCH: 28.2 pg (ref 26.0–34.0)
MCHC: 34.3 g/dL (ref 30.0–36.0)
MCV: 82.3 fL (ref 78.0–100.0)
PLATELETS: 348 10*3/uL (ref 150–400)
RBC: 5.31 MIL/uL — AB (ref 3.87–5.11)
RDW: 14.1 % (ref 11.5–15.5)
WBC: 11.7 10*3/uL — AB (ref 4.0–10.5)

## 2015-08-06 LAB — LIPASE, BLOOD: LIPASE: 33 U/L (ref 11–51)

## 2015-08-06 LAB — I-STAT BETA HCG BLOOD, ED (MC, WL, AP ONLY)

## 2015-08-06 MED ORDER — HYDROMORPHONE HCL 1 MG/ML IJ SOLN
1.0000 mg | Freq: Once | INTRAMUSCULAR | Status: AC
  Administered 2015-08-06: 1 mg via INTRAVENOUS
  Filled 2015-08-06: qty 1

## 2015-08-06 MED ORDER — GI COCKTAIL ~~LOC~~
30.0000 mL | Freq: Once | ORAL | Status: AC
  Administered 2015-08-07: 30 mL via ORAL
  Filled 2015-08-06: qty 30

## 2015-08-06 MED ORDER — PROMETHAZINE HCL 25 MG/ML IJ SOLN
12.5000 mg | Freq: Once | INTRAMUSCULAR | Status: AC
  Administered 2015-08-06: 12.5 mg via INTRAVENOUS
  Filled 2015-08-06: qty 1

## 2015-08-06 MED ORDER — SODIUM CHLORIDE 0.9 % IV BOLUS (SEPSIS)
1000.0000 mL | Freq: Once | INTRAVENOUS | Status: AC
  Administered 2015-08-06: 1000 mL via INTRAVENOUS

## 2015-08-06 MED ORDER — METOCLOPRAMIDE HCL 5 MG/ML IJ SOLN
10.0000 mg | Freq: Once | INTRAMUSCULAR | Status: AC
  Administered 2015-08-06: 10 mg via INTRAVENOUS
  Filled 2015-08-06: qty 2

## 2015-08-06 MED ORDER — ONDANSETRON HCL 4 MG/2ML IJ SOLN
4.0000 mg | Freq: Once | INTRAMUSCULAR | Status: AC
  Administered 2015-08-06: 4 mg via INTRAVENOUS
  Filled 2015-08-06: qty 2

## 2015-08-06 NOTE — ED Notes (Signed)
Pelvic cart set up at bedside  
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
## 2015-08-06 NOTE — ED Triage Notes (Signed)
Per [facility]: Pt complaining of pancreatitis, pt states that she is in pain, "pt is already requesting pain medication and wants you guys to make the pain go away", per [facility] "pt has been taking the zofran like a pill and throwing it back up".
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
## 2015-08-06 NOTE — ED Notes (Signed)
Patient transported to X-ray 

## 2015-08-06 NOTE — ED Notes (Signed)
Pt states " I haven't taken any of the pain medication today, I just throw it back up"

## 2015-08-07 ENCOUNTER — Observation Stay (HOSPITAL_COMMUNITY): Payer: Medicaid Other

## 2015-08-07 DIAGNOSIS — R7303 Prediabetes: Secondary | ICD-10-CM | POA: Diagnosis present

## 2015-08-07 DIAGNOSIS — K589 Irritable bowel syndrome without diarrhea: Secondary | ICD-10-CM | POA: Diagnosis present

## 2015-08-07 DIAGNOSIS — E7800 Pure hypercholesterolemia, unspecified: Secondary | ICD-10-CM | POA: Diagnosis present

## 2015-08-07 DIAGNOSIS — R739 Hyperglycemia, unspecified: Secondary | ICD-10-CM | POA: Diagnosis present

## 2015-08-07 DIAGNOSIS — F1010 Alcohol abuse, uncomplicated: Secondary | ICD-10-CM | POA: Diagnosis present

## 2015-08-07 DIAGNOSIS — N760 Acute vaginitis: Secondary | ICD-10-CM | POA: Diagnosis present

## 2015-08-07 DIAGNOSIS — J45909 Unspecified asthma, uncomplicated: Secondary | ICD-10-CM | POA: Diagnosis present

## 2015-08-07 DIAGNOSIS — H5442 Blindness, left eye, normal vision right eye: Secondary | ICD-10-CM | POA: Diagnosis present

## 2015-08-07 DIAGNOSIS — R1110 Vomiting, unspecified: Secondary | ICD-10-CM | POA: Diagnosis present

## 2015-08-07 DIAGNOSIS — F259 Schizoaffective disorder, unspecified: Secondary | ICD-10-CM | POA: Diagnosis present

## 2015-08-07 DIAGNOSIS — K861 Other chronic pancreatitis: Secondary | ICD-10-CM | POA: Diagnosis present

## 2015-08-07 DIAGNOSIS — Z6841 Body Mass Index (BMI) 40.0 and over, adult: Secondary | ICD-10-CM | POA: Diagnosis not present

## 2015-08-07 DIAGNOSIS — F3170 Bipolar disorder, currently in remission, most recent episode unspecified: Secondary | ICD-10-CM | POA: Diagnosis present

## 2015-08-07 DIAGNOSIS — R1013 Epigastric pain: Secondary | ICD-10-CM | POA: Diagnosis present

## 2015-08-07 DIAGNOSIS — K5900 Constipation, unspecified: Secondary | ICD-10-CM | POA: Diagnosis not present

## 2015-08-07 DIAGNOSIS — G8929 Other chronic pain: Secondary | ICD-10-CM | POA: Diagnosis present

## 2015-08-07 DIAGNOSIS — R112 Nausea with vomiting, unspecified: Secondary | ICD-10-CM | POA: Diagnosis present

## 2015-08-07 DIAGNOSIS — K219 Gastro-esophageal reflux disease without esophagitis: Secondary | ICD-10-CM | POA: Diagnosis present

## 2015-08-07 DIAGNOSIS — I671 Cerebral aneurysm, nonruptured: Secondary | ICD-10-CM | POA: Diagnosis present

## 2015-08-07 DIAGNOSIS — F17210 Nicotine dependence, cigarettes, uncomplicated: Secondary | ICD-10-CM | POA: Diagnosis present

## 2015-08-07 DIAGNOSIS — K829 Disease of gallbladder, unspecified: Secondary | ICD-10-CM | POA: Diagnosis present

## 2015-08-07 DIAGNOSIS — A499 Bacterial infection, unspecified: Secondary | ICD-10-CM | POA: Diagnosis not present

## 2015-08-07 DIAGNOSIS — M797 Fibromyalgia: Secondary | ICD-10-CM | POA: Diagnosis present

## 2015-08-07 DIAGNOSIS — E785 Hyperlipidemia, unspecified: Secondary | ICD-10-CM | POA: Diagnosis present

## 2015-08-07 DIAGNOSIS — I10 Essential (primary) hypertension: Secondary | ICD-10-CM | POA: Diagnosis present

## 2015-08-07 LAB — CBC
HCT: 39.5 % (ref 36.0–46.0)
HCT: 41.5 % (ref 36.0–46.0)
Hemoglobin: 13.4 g/dL (ref 12.0–15.0)
Hemoglobin: 13.8 g/dL (ref 12.0–15.0)
MCH: 27.7 pg (ref 26.0–34.0)
MCH: 28.1 pg (ref 26.0–34.0)
MCHC: 33.3 g/dL (ref 30.0–36.0)
MCHC: 33.9 g/dL (ref 30.0–36.0)
MCV: 82.8 fL (ref 78.0–100.0)
MCV: 83.2 fL (ref 78.0–100.0)
PLATELETS: 302 10*3/uL (ref 150–400)
PLATELETS: 312 10*3/uL (ref 150–400)
RBC: 4.77 MIL/uL (ref 3.87–5.11)
RBC: 4.99 MIL/uL (ref 3.87–5.11)
RDW: 14 % (ref 11.5–15.5)
RDW: 14.1 % (ref 11.5–15.5)
WBC: 12.3 10*3/uL — AB (ref 4.0–10.5)
WBC: 12.9 10*3/uL — ABNORMAL HIGH (ref 4.0–10.5)

## 2015-08-07 LAB — BASIC METABOLIC PANEL
Anion gap: 8 (ref 5–15)
BUN: 5 mg/dL — AB (ref 6–20)
CALCIUM: 9.1 mg/dL (ref 8.9–10.3)
CHLORIDE: 100 mmol/L — AB (ref 101–111)
CO2: 26 mmol/L (ref 22–32)
CREATININE: 1.02 mg/dL — AB (ref 0.44–1.00)
GFR calc Af Amer: >60 mL/min (ref >60)
GFR calc non Af Amer: >60 mL/min (ref >60)
GLUCOSE: 143 mg/dL — AB (ref 65–99)
Potassium: 3.6 mmol/L (ref 3.5–5.1)
Sodium: 134 mmol/L — ABNORMAL LOW (ref 135–145)

## 2015-08-07 LAB — TSH: TSH: 0.479 u[IU]/mL (ref 0.350–4.500)

## 2015-08-07 LAB — LACTIC ACID, PLASMA
Lactic Acid, Venous: 1.9 mmol/L (ref 0.5–1.9)
Lactic Acid, Venous: 1.2 mmol/L (ref 0.5–1.9)

## 2015-08-07 LAB — CREATININE, SERUM: CREATININE: 0.86 mg/dL (ref 0.44–1.00)

## 2015-08-07 MED ORDER — ONDANSETRON HCL 4 MG/2ML IJ SOLN
INTRAMUSCULAR | Status: AC
  Administered 2015-08-07: 4 mg via INTRAVENOUS
  Filled 2015-08-07: qty 2

## 2015-08-07 MED ORDER — KETOROLAC TROMETHAMINE 30 MG/ML IJ SOLN
30.0000 mg | Freq: Once | INTRAMUSCULAR | Status: AC
  Administered 2015-08-07: 30 mg via INTRAVENOUS
  Filled 2015-08-07: qty 1

## 2015-08-07 MED ORDER — HYDROMORPHONE HCL 1 MG/ML IJ SOLN
1.0000 mg | INTRAMUSCULAR | Status: DC | PRN
  Administered 2015-08-07 (×3): 1 mg via INTRAVENOUS
  Filled 2015-08-07 (×3): qty 1

## 2015-08-07 MED ORDER — ALBUTEROL SULFATE (2.5 MG/3ML) 0.083% IN NEBU: 3.0000 mL | INHALATION_SOLUTION | Freq: Four times a day (QID) | RESPIRATORY_TRACT | Status: DC | PRN

## 2015-08-07 MED ORDER — MILK AND MOLASSES ENEMA
1.0000 | Freq: Once | RECTAL | Status: DC
  Filled 2015-08-07: qty 250

## 2015-08-07 MED ORDER — SODIUM CHLORIDE 0.9 % IV SOLN: INTRAVENOUS | Status: AC

## 2015-08-07 MED ORDER — ONDANSETRON 4 MG PO TBDP
4.0000 mg | ORAL_TABLET | Freq: Three times a day (TID) | ORAL | Status: DC | PRN
  Administered 2015-08-07 – 2015-08-09 (×2): 4 mg via ORAL
  Filled 2015-08-07 (×2): qty 1

## 2015-08-07 MED ORDER — HYDROMORPHONE HCL 1 MG/ML IJ SOLN: 1.0000 mg | INTRAMUSCULAR | Status: DC | PRN

## 2015-08-07 MED ORDER — TIZANIDINE HCL 2 MG PO TABS
2.0000 mg | ORAL_TABLET | Freq: Two times a day (BID) | ORAL | Status: DC | PRN
Start: 2015-08-07 — End: 2015-08-10
  Administered 2015-08-07 – 2015-08-09 (×2): 2 mg via ORAL
  Filled 2015-08-07 (×2): qty 1

## 2015-08-07 MED ORDER — AMITRIPTYLINE HCL 50 MG PO TABS: 50.0000 mg | ORAL_TABLET | Freq: Every evening | ORAL | Status: DC | PRN

## 2015-08-07 MED ORDER — ONDANSETRON HCL 4 MG/2ML IJ SOLN
4.0000 mg | Freq: Four times a day (QID) | INTRAMUSCULAR | Status: DC
  Administered 2015-08-07 – 2015-08-10 (×9): 4 mg via INTRAVENOUS
  Filled 2015-08-07 (×9): qty 2

## 2015-08-07 MED ORDER — ONDANSETRON HCL 4 MG/2ML IJ SOLN: 4.0000 mg | Freq: Three times a day (TID) | INTRAMUSCULAR | Status: DC | PRN

## 2015-08-07 MED ORDER — ONDANSETRON HCL 4 MG/2ML IJ SOLN
4.0000 mg | Freq: Three times a day (TID) | INTRAMUSCULAR | Status: AC | PRN
  Administered 2015-08-07: 4 mg via INTRAVENOUS
  Filled 2015-08-07: qty 2

## 2015-08-07 MED ORDER — SODIUM CHLORIDE 0.9 % IV SOLN
INTRAVENOUS | Status: DC
  Administered 2015-08-07: 1 mL via INTRAVENOUS
  Administered 2015-08-07 (×2): via INTRAVENOUS
  Administered 2015-08-07: 1 mL via INTRAVENOUS
  Administered 2015-08-08 – 2015-08-09 (×5): via INTRAVENOUS

## 2015-08-07 MED ORDER — MILK AND MOLASSES ENEMA
1.0000 | Freq: Once | RECTAL | Status: AC
  Administered 2015-08-07: 250 mL via RECTAL
  Filled 2015-08-07: qty 250

## 2015-08-07 MED ORDER — METOCLOPRAMIDE HCL 5 MG/ML IJ SOLN
10.0000 mg | Freq: Four times a day (QID) | INTRAMUSCULAR | Status: DC
  Administered 2015-08-07 – 2015-08-10 (×8): 10 mg via INTRAVENOUS
  Filled 2015-08-07 (×8): qty 2

## 2015-08-07 MED ORDER — ENOXAPARIN SODIUM 60 MG/0.6ML ~~LOC~~ SOLN
60.0000 mg | SUBCUTANEOUS | Status: DC
  Administered 2015-08-07 – 2015-08-09 (×3): 60 mg via SUBCUTANEOUS
  Filled 2015-08-07 (×3): qty 0.6

## 2015-08-07 MED ORDER — METOCLOPRAMIDE HCL 10 MG PO TABS
10.0000 mg | ORAL_TABLET | Freq: Three times a day (TID) | ORAL | Status: DC | PRN
  Administered 2015-08-08: 10 mg via ORAL
  Filled 2015-08-07: qty 1

## 2015-08-07 MED ORDER — DOCUSATE SODIUM 100 MG PO CAPS
100.0000 mg | ORAL_CAPSULE | Freq: Two times a day (BID) | ORAL | Status: DC
  Administered 2015-08-07 – 2015-08-09 (×6): 100 mg via ORAL
  Filled 2015-08-07 (×7): qty 1

## 2015-08-07 MED ORDER — PANTOPRAZOLE SODIUM 40 MG PO TBEC
40.0000 mg | DELAYED_RELEASE_TABLET | Freq: Two times a day (BID) | ORAL | Status: DC
  Administered 2015-08-07 – 2015-08-09 (×6): 40 mg via ORAL
  Filled 2015-08-07 (×7): qty 1

## 2015-08-07 NOTE — Progress Notes (Signed)
Pt claimed she had 3 good bowel movements post retention enema.

## 2015-08-07 NOTE — Progress Notes (Signed)
Family Medicine Teaching Service Daily Progress Note Intern Pager: [phone]  Patient name: Holly Mendez Medical record number: [id number] Date of birth: 1975/02/03 Age: 40 y.o. Gender: female  Primary Care Provider: [name], MD Consultants: None Code Status: Full  Pt Overview and Major Events to Date:  08/06/15:  Admitted to FMTS under attending [place]  Assessment and Plan: Holly Mendez is a 40 y.o. female presenting with abdominal pain accompanied with nausea and vomiting most likely secondary to ongoing pancreatitis flare for which she was admitted earlier this week (7/24) . PMH is significant for chronic pancreatitis, HLD, GERD. Fibromyalgia, bipolar disorder, asthma, possible history of anterior communicating artery aneurysm and obesity.   #Epigastric pain, nausea, emesis most likely secondary to ongoing pancreatitis Patient admitted earlier this week (7/24) for similar symptoms, was discharged on 7/25 with symptoms improvement. Patient returned with similar symptomic on 7/26, was treated with improvement in symptoms and discharged on 7/27. Once again on 7/28, patient endorses similar symptoms after discharge from hospital, multiple episodes of emesis (x20) epigastric pain mostly radiating to her chest that she describes as a burning pain 9/10 up from 5/10 on discharge per patient. Pain did not radiate to her back.  No flair of pancreatitis on either recent admission.  Much improved after bowel movements today. RUQ US showed mild distension of CBD and some sludge.  No stones, no pericholecystic fluid or gallbladder wall thickening.  --NPO except for meds, advance to liquids as tolerated --phenergan 15mg  IV for nausea -- protonix 40mg   BID  -- NS 150 cc/hr  #BV Patient also reports clear fluid leaking from vagina during emesis. Vaginal exam was unremarkable, however wet prep revealed clue cells w/ moderate WBC consistent with bacterial vaginosis. Patient denies any other  urinary symptoms, no dysuria, increased frequency or urgency. Will start treatment prior to discharge, at the moment patient is unable to tolerate po. --Start patient on 500mg  po bid for 7 days   #Constipation, subacute Patient reports last bowel movement was over a week ago (Wed- 7/19). Patient received an unsuccessful enema on last admission. Patient has been on opiate for pain control which could potentially exacerbate possible constipation though NPO most of this week. Imaging did not show bowel dilation or significant stool burden, however patient has complained about it. Has been constipated and not had BM since last week.  Had multiple bowel movements today and feels improved after molasses enema.  --Continue IVF 150 cc/hr -- cont to monitor for symptoms  #Alcohol abuse.  Last drink was two weeks ago after murder of her brother. Patient with a history of alcohol abuse. --Will monitor for withdrawal symptoms and CIWA if needed  #Mildly elevated BP: BP 150s/90s in ED. Likely autonomic response due to pain. No history of hypertension.  -- Will monitor BP with symptoms improvement   #Hyperglycemia: Mildly elevated blood glucose on BMP. No history of diabetes.  A1c is 6.3 patient meet pre diabetes criteria. Patient also had some ketones in UA most likely secondary to increase physiologic stress, limited po intake in the past week and excessive vomiting. --Will continue to monitor CBGs --Follow up with PCP in the outpatient setting   #Hyperlipidemia: ASCVD risk score 5.3% based on her most recent lipid panel and most recent blood pressure - Would benefit from moderate intensity statin - will defer to PCP  #Fibromyalgia and chronic back pain --Will continue home regimen of amitriptyline, norco and zanaflex prn while NPO if patient can tolerate.  #Asthma  --  Will continue, Albuterol neb q6 prn  #Bipolar disorder, in remission  --No medications at the moment, well controlled    #Anterior communicating artery aneurysm.MRI/MRA on 10/30/2014 showing probable 30mm ACA aneurysm vs dysplastic ACA. Follow up CTA was recommended and not performed --Follow up CTA as outpatient  FEN/GI: NPO, NS @150cc /hr PPx: PPI  Disposition: pending medical improvement  Subjective:  Patient had multiple bowl movements today and feels much improved.    Objective: Temp:  [97.8 F (36.6 C)-98.4 F (36.9 C)] 97.8 F (36.6 C) (07/29 0548) Pulse Rate:  [82-98] 82 (07/29 0548) Resp:  [16-26] 17 (07/29 0548) BP: (104-158)/(63-97) 104/63 (07/29 0548) SpO2:  [93 %-100 %] 93 % (07/29 0548) Weight:  [261 lb 3.2 oz (118.5 kg)] 261 lb 3.2 oz (118.5 kg) (07/29 0016) Physical Exam: General: 40yo female in NAD lying in hospital bed Eyes: PERRL, EOMI ENTM: MMM, O/P clear Neck: FROM, supple Cardiovascular: RRR, no murmurs noted Respiratory: NWOB, CTAB Abdomen: Obese, +BS, S, non-tender, non-distended.  MSK: No cyanosis or edema Skin: Warm, dry Neuro: Alert and conversational. No focal neurological deficits.  Psych: Normal affect and thought content  Laboratory:  Recent Labs Lab 08/06/15 1650 08/07/15 0043 08/07/15 0324  WBC 11.7* 12.3* 12.9*  HGB 15.0 13.8 13.4  HCT 43.7 41.5 39.5  PLT 348 312 302    Recent Labs Lab 08/04/15 0628 08/05/15 0255 08/06/15 1650 08/07/15 0043 08/07/15 0324  NA 134* 133* 130*  --  134*  K 3.6 3.2* 4.3  --  3.6  CL 99* 100* 96*  --  100*  CO2 26 26 22   --  26  BUN 6 <5* <5*  --  5*  CREATININE 0.79 0.87 0.90 0.86 1.02*  CALCIUM 9.2 8.9 9.8  --  9.1  PROT 8.4* 7.2 8.2*  --   --   BILITOT 0.8 0.7 0.9  --   --   ALKPHOS 89 80 93  --   --   ALT 38 30 42  --   --   AST 40 32 42*  --   --   GLUCOSE 145* 132* 147*  --  143*    Imaging/Diagnostic Tests: Dg Abd Acute W/chest  Result Date: 08/06/2015 CLINICAL DATA:  Emesis and constipation for 1 week, initial encounter EXAM: DG ABDOMEN ACUTE W/ 1V CHEST COMPARISON:  08/02/2015 FINDINGS:  There is no evidence of dilated bowel loops or free intraperitoneal air. Fecal material is seen within the colon although no obstructive changes are seen. No radiopaque calculi or other significant radiographic abnormality is seen. Heart size and mediastinal contours are within normal limits. Both lungs are clear. IMPRESSION: No acute abnormality noted. Electronically Signed   By: Inez Catalina M.D.   On: 08/06/2015 21:33   Eloise Levels, MD 08/07/2015, 6:56 AM PGY-1, [place] Intern pager: [phone], text pages welcome

## 2015-08-08 ENCOUNTER — Encounter (HOSPITAL_COMMUNITY): Payer: Self-pay | Admitting: *Deleted

## 2015-08-08 ENCOUNTER — Inpatient Hospital Stay (HOSPITAL_COMMUNITY): Payer: Medicaid Other

## 2015-08-08 DIAGNOSIS — E876 Hypokalemia: Secondary | ICD-10-CM

## 2015-08-08 LAB — HEPATIC FUNCTION PANEL
ALBUMIN: 4.3 g/dL (ref 3.5–5.0)
ALT: 32 U/L (ref 14–54)
AST: 28 U/L (ref 15–41)
Alkaline Phosphatase: 87 U/L (ref 38–126)
BILIRUBIN INDIRECT: 0.4 mg/dL (ref 0.3–0.9)
Bilirubin, Direct: 0.2 mg/dL (ref 0.1–0.5)
TOTAL PROTEIN: 8.1 g/dL (ref 6.5–8.1)
Total Bilirubin: 0.6 mg/dL (ref 0.3–1.2)

## 2015-08-08 LAB — BASIC METABOLIC PANEL
Anion gap: 11 (ref 5–15)
CHLORIDE: 98 mmol/L — AB (ref 101–111)
CO2: 25 mmol/L (ref 22–32)
Calcium: 9.2 mg/dL (ref 8.9–10.3)
Creatinine, Ser: 0.81 mg/dL (ref 0.44–1.00)
Glucose, Bld: 123 mg/dL — ABNORMAL HIGH (ref 65–99)
POTASSIUM: 3.6 mmol/L (ref 3.5–5.1)
SODIUM: 134 mmol/L — AB (ref 135–145)

## 2015-08-08 LAB — CBC
HCT: 43.4 % (ref 36.0–46.0)
HEMOGLOBIN: 14.3 g/dL (ref 12.0–15.0)
MCH: 27.6 pg (ref 26.0–34.0)
MCHC: 32.9 g/dL (ref 30.0–36.0)
MCV: 83.6 fL (ref 78.0–100.0)
PLATELETS: 253 10*3/uL (ref 150–400)
RBC: 5.19 MIL/uL — AB (ref 3.87–5.11)
RDW: 14.2 % (ref 11.5–15.5)
WBC: 12.5 10*3/uL — AB (ref 4.0–10.5)

## 2015-08-08 MED ORDER — SODIUM CHLORIDE 0.9 % IV SOLN
25.0000 mg | Freq: Once | INTRAVENOUS | Status: AC
  Administered 2015-08-08: 25 mg via INTRAVENOUS
  Filled 2015-08-08: qty 1

## 2015-08-08 MED ORDER — KETOROLAC TROMETHAMINE 30 MG/ML IJ SOLN
30.0000 mg | Freq: Once | INTRAMUSCULAR | Status: AC
  Administered 2015-08-08: 30 mg via INTRAVENOUS
  Filled 2015-08-08: qty 1

## 2015-08-08 MED ORDER — KETOROLAC TROMETHAMINE 30 MG/ML IJ SOLN
30.0000 mg | Freq: Once | INTRAMUSCULAR | Status: AC
  Administered 2015-08-09: 30 mg via INTRAVENOUS
  Filled 2015-08-08: qty 1

## 2015-08-08 NOTE — Progress Notes (Signed)
Family Medicine Teaching Service Daily Progress Note Intern Pager: [phone]  Patient name: Holly Mendez Medical record number: [id number] Date of birth: [birth year] Age: 40 y.o. Gender: female  Primary Care Provider: [name], MD Consultants: None Code Status: Full  Pt Overview and Major Events to Date:  08/06/15:  Admitted to FMTS under attending [place]el 08/07/15: recurrent episodes of n/v  Assessment and Plan: Holly Mendez is a 40 y.o. female presenting with abdominal pain accompanied with nausea and vomiting most likely secondary to ongoing pancreatitis flare for which she was admitted earlier this week (7/24) . PMH is significant for chronic pancreatitis, HLD, GERD. Fibromyalgia, bipolar disorder, asthma, possible history of anterior communicating artery aneurysm and obesity.   #Epigastric pain, nausea, emesis Patient admitted earlier this week (7/24) for similar symptoms and now has bounced back twice. At that time diagnoses was pancreatitis. This admission she endorses multiple episodes of emesis (x20) and epigastric pain mostly radiating to her chest. Unsure of diagnosis at this time. Believe there is a strong psych component with somatic manifestions. However, RUQ US showed mild distension of CBD and some sludge yesterday.  No stones, no pericholecystic fluid or gallbladder wall thickening. Could be a gallbladder etiology.  --NPO except for meds, advance to liquids as tolerated --phenergan 15mg  IV for nausea -- protonix 40mg   BID  -- NS 125 cc/hr --Follow-up LFTs --GI consult for possible MRCP -- Avoid narcotics; NSAIDs or tylenol prn for pain  #BV Patient also reports clear fluid leaking from vagina during emesis. Vaginal exam was unremarkable, however wet prep revealed clue cells w/ moderate WBC consistent with bacterial vaginosis. Patient denies any other urinary symptoms, no dysuria, increased frequency or urgency. Will start treatment prior to discharge, at the  moment patient is unable to tolerate po. --Start patient on 500mg  po bid for 7 days  #Constipation, subacute Patient reports last bowel movement was over a week ago (Wed- 7/19). Patient received an unsuccessful enema on last admission. Patient has been on opiate for pain control which could potentially exacerbate possible constipation though NPO most of this week. Imaging did not show bowel dilation or significant stool burden, however patient has complained about it. Resolved. Now s/p multiple BMs after enema.   #Alcohol abuse.  Last drink was two weeks ago after murder of her brother. Patient with a history of alcohol abuse. --Will monitor for withdrawal symptoms and CIWA if needed  #Mildly elevated BP: BP 150s/90s in ED. Likely autonomic response due to pain. No history of hypertension.  -- Will monitor BP with symptom improvement   #Hyperglycemia: Mildly elevated blood glucose on BMP. No history of diabetes.  A1c is 6.3 patient meets pre diabetes criteria. Patient also had some ketones in UA most likely secondary to increase physiologic stress, limited po intake in the past week and excessive vomiting. --Will continue to monitor CBGs --Follow up with PCP in the outpatient setting   #Hyperlipidemia: ASCVD risk score 5.3% based on her most recent lipid panel and most recent blood pressure - Would benefit from moderate intensity statin - will defer to PCP  #Fibromyalgia and chronic back pain --Will continue home regimen of amitriptyline, norco and zanaflex prn while NPO if patient can tolerate.  #Asthma  -- Will continue, Albuterol neb q6 prn  #Bipolar disorder, in remission  --No medications at the moment, well controlled   #Anterior communicating artery aneurysm.MRI/MRA on 10/30/2014 showing probable 31mm ACA aneurysm vs dysplastic ACA. Follow up CTA was recommended and not performed --Follow  up CTA as outpatient  FEN/GI: NPO, NS @125cc /hr PPx: PPI  Disposition:  pending medical improvement  Subjective:  Complains of crampy abdominal pain this morning. Had advancement of diet to clear liquids and patient had recurrence of emesis. Requesting something for pain.   Objective: Temp:  [97.8 F (36.6 C)-99.7 F (37.6 C)] 99.7 F (37.6 C) (07/30 0544) Pulse Rate:  [80-93] 80 (07/30 0544) Resp:  [18-20] 18 (07/30 0544) BP: (134-147)/(70-88) 146/88 (07/30 0544) SpO2:  [97 %-100 %] 99 % (07/30 0544) Physical Exam: General: mild distress, lying in hospital bed, obese ENTM: MMM Cardiovascular: RRR Respiratory: NWOB, CTAB Abdomen: Obese, +BS, S, epigastric and RUQ tenderness, non-distended.  MSK: No edema Skin: Warm, dry Neuro: Alert and conversational. No focal neurological deficits.  Psych: Normal affect and thought content  Laboratory:  Recent Labs Lab 08/06/15 1650 08/07/15 0043 08/07/15 0324  WBC 11.7* 12.3* 12.9*  HGB 15.0 13.8 13.4  HCT 43.7 41.5 39.5  PLT 348 312 302    Recent Labs Lab 08/04/15 0628 08/05/15 0255 08/06/15 1650 08/07/15 0043 08/07/15 0324  NA 134* 133* 130*  --  134*  K 3.6 3.2* 4.3  --  3.6  CL 99* 100* 96*  --  100*  CO2 26 26 22   --  26  BUN 6 <5* <5*  --  5*  CREATININE 0.79 0.87 0.90 0.86 1.02*  CALCIUM 9.2 8.9 9.8  --  9.1  PROT 8.4* 7.2 8.2*  --   --   BILITOT 0.8 0.7 0.9  --   --   ALKPHOS 89 80 93  --   --   ALT 38 30 42  --   --   AST 40 32 42*  --   --   GLUCOSE 145* 132* 147*  --  143*    Imaging/Diagnostic Tests: Dg Abd Acute W/chest  Result Date: 08/06/2015 CLINICAL DATA:  Emesis and constipation for 1 week, initial encounter EXAM: DG ABDOMEN ACUTE W/ 1V CHEST COMPARISON:  08/02/2015 FINDINGS: There is no evidence of dilated bowel loops or free intraperitoneal air. Fecal material is seen within the colon although no obstructive changes are seen. No radiopaque calculi or other significant radiographic abnormality is seen. Heart size and mediastinal contours are within normal limits. Both  lungs are clear. IMPRESSION: No acute abnormality noted. Electronically Signed   By: Inez Catalina M.D.   On: 08/06/2015 21:33  US Abdomen Limited Ruq  Result Date: 08/07/2015 CLINICAL DATA:  Right upper quadrant pain.  Pancreatitis. EXAM: US ABDOMEN LIMITED - RIGHT UPPER QUADRANT COMPARISON:  April 24, 2012 ultrasound of the abdomen FINDINGS: Gallbladder: A small amount of sludge is seen in the gallbladder with no wall thickening, pericholecystic fluid, stones, or Murphy's sign. Common bile duct: Diameter: 7.1 mm Liver: Increased echogenicity in the liver with no focal mass. IMPRESSION: 1. There is a small amount of sludge in the gallbladder. The gallbladder is otherwise normal in appearance. 2. The common bile duct measures up to 7.1 mm which is prominent for age. Recommend correlation with labs. If there is concern for biliary obstruction, an MRCP could better evaluate. 3. Probable hepatic steatosis. Electronically Signed   By: Dorise Bullion III M.D   On: 08/07/2015 11:16   Katheren Shams, DO 08/08/2015, 6:15 AM PGY-3, [facility] Intern pager: [phone], text pages welcome
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
## 2015-08-09 ENCOUNTER — Encounter (HOSPITAL_COMMUNITY): Payer: Self-pay | Admitting: *Deleted

## 2015-08-09 LAB — GC/CHLAMYDIA PROBE AMP (~~LOC~~) NOT AT ARMC
Chlamydia: NEGATIVE
Neisseria Gonorrhea: NEGATIVE

## 2015-08-09 MED ORDER — TIZANIDINE HCL 2 MG PO CAPS: 2.0000 mg | ORAL_CAPSULE | Freq: Two times a day (BID) | ORAL | 5 refills | Status: DC | PRN

## 2015-08-09 MED ORDER — ALUM & MAG HYDROXIDE-SIMETH 200-200-20 MG/5ML PO SUSP
30.0000 mL | Freq: Once | ORAL | Status: AC
  Administered 2015-08-09: 30 mL via ORAL
  Filled 2015-08-09: qty 30

## 2015-08-09 MED ORDER — POLYETHYLENE GLYCOL 3350 17 G PO PACK
17.0000 g | PACK | Freq: Every day | ORAL | Status: DC
  Administered 2015-08-09: 17 g via ORAL
  Filled 2015-08-09: qty 1

## 2015-08-09 NOTE — Progress Notes (Signed)
Family Medicine Teaching Service Daily Progress Note Intern Pager: [phone]  Patient name: Holly Mendez Medical record number: [id number] Date of birth: [birth year] Age: 40 y.o. Gender: female  Primary Care Provider: [name], MD Consultants: None Code Status: Full  Pt Overview and Major Events to Date:  08/06/15:  Admitted to FMTS under attending Hensel 08/07/15: Recurrent episodes of n/v  Assessment and Plan: Holly Mendez is a 40 y.o. female presenting with abdominal pain accompanied with nausea and vomiting most likely secondary to ongoing pancreatitis flare for which she was admitted earlier this week (7/24) . PMH is significant for chronic pancreatitis, HLD, GERD. Fibromyalgia, bipolar disorder, asthma, possible history of anterior communicating artery aneurysm and obesity.   #Epigastric pain, nausea, emesis Patient admitted earlier this week (7/24) for similar symptoms and now has bounced back twice. At that time diagnoses was pancreatitis. This admission she endorses multiple episodes of emesis (x20) and epigastric pain mostly radiating to her chest. Unsure of diagnosis at this time. Believe there is a strong psych component with somatic manifestions. However, RUQ US showed mild distension of CBD and some sludge yesterday.  No stones, no pericholecystic fluid or gallbladder wall thickening. Could be a gallbladder etiology.  MRCP normal.  GI suggests gastric emptying study. LFT normal. Will wait for GI recs and advance diet to liquids.  --NPO except for meds, advance to liquids as tolerated --phenergan 15mg  IV for nausea -- add colace when diet is advanced -- f/u GI recs -- protonix 40mg   BID  -- NS 125 cc/hr -- Avoid narcotics; NSAIDs or tylenol prn for pain  #BV Patient also reports clear fluid leaking from vagina during emesis. Vaginal exam was unremarkable, however wet prep revealed clue cells w/ moderate WBC consistent with bacterial vaginosis. Patient denies any  other urinary symptoms, no dysuria, increased frequency or urgency. Will start treatment prior to discharge, at the moment patient is unable to tolerate po. --Start patient on 500mg  po bid for 7 days  #Constipation, subacute Patient reports last bowel movement was over a week ago (Wed- 7/19). Patient received an unsuccessful enema on last admission. Patient has been on opiate for pain control which could potentially exacerbate possible constipation though NPO most of this week. Imaging did not show bowel dilation or significant stool burden, however patient has complained about it. Now resolved after multiple BM s/p enema.   #Alcohol abuse.  Last drink was two weeks ago after murder of her brother. Patient with a history of alcohol abuse. --Will monitor for withdrawal symptoms and CIWA if needed  #Mildly elevated BP: BP 150s/90s in ED. Likely autonomic response due to pain. No history of hypertension. 132/83 this AM.  -- Will monitor BP with symptom improvement   #Hyperglycemia: Mildly elevated blood glucose on BMP. No history of diabetes.  A1c is 6.3 patient meets pre diabetes criteria. Patient also had some ketones in UA most likely secondary to increase physiologic stress, limited po intake in the past week and excessive vomiting. CBG 123 --Will continue to monitor CBGs --Follow up with PCP in the outpatient setting   #Hyperlipidemia: ASCVD risk score 5.3% based on her most recent lipid panel and most recent blood pressure - Would benefit from moderate intensity statin - will defer to PCP  #Fibromyalgia and chronic back pain --Will continue home regimen of amitriptyline, norco and zanaflex prn while NPO if patient can tolerate.  #Asthma  -- Will continue, Albuterol neb q6 prn  #Bipolar disorder, in remission  --No medications  at the moment, well controlled   #Anterior communicating artery aneurysm.MRI/MRA on 10/30/2014 showing probable 36mm ACA aneurysm vs dysplastic ACA.  Follow up CTA was recommended and not performed --Follow up CTA as outpatient  FEN/GI: NPO, NS @125cc /hr PPx: PPI  Disposition: pending medical improvement  Subjective:  Complains of crampy abdominal pain this morning. NPO, patient asking for ice chips.   Objective: Temp:  [98.4 F (36.9 C)-98.5 F (36.9 C)] 98.5 F (36.9 C) (07/31 1406) Pulse Rate:  [86-90] 90 (07/31 1406) Resp:  [19] 19 (07/31 1406) BP: (130-132)/(76-83) 130/76 (07/31 1406) SpO2:  [95 %-100 %] 95 % (07/31 1406) Physical Exam: General: mild distress, lying in hospital bed, obese ENTM: MMM Cardiovascular: RRR Respiratory: NWOB, CTAB Abdomen: Obese, +BS, S, epigastric and RUQ tenderness, non-distended.  MSK: No edema Skin: Warm, dry Neuro: Alert and conversational. No focal neurological deficits.  Psych: Normal affect and thought content  Laboratory:  Recent Labs Lab 08/07/15 0043 08/07/15 0324 08/08/15 0945  WBC 12.3* 12.9* 12.5*  HGB 13.8 13.4 14.3  HCT 41.5 39.5 43.4  PLT 312 302 253    Recent Labs Lab 08/05/15 0255 08/06/15 1650 08/07/15 0043 08/07/15 0324 08/08/15 0945  NA 133* 130*  --  134* 134*  K 3.2* 4.3  --  3.6 3.6  CL 100* 96*  --  100* 98*  CO2 26 22  --  26 25  BUN <5* <5*  --  5* <5*  CREATININE 0.87 0.90 0.86 1.02* 0.81  CALCIUM 8.9 9.8  --  9.1 9.2  PROT 7.2 8.2*  --   --  8.1  BILITOT 0.7 0.9  --   --  0.6  ALKPHOS 80 93  --   --  87  ALT 30 42  --   --  32  AST 32 42*  --   --  28  GLUCOSE 132* 147*  --  143* 123*    Imaging/Diagnostic Tests: Mr Abdomen Mrcp Wo Cm  Result Date: 08/08/2015 CLINICAL DATA:  Abdominal pain common nausea and vomiting with history of pancreatitis. EXAM: MRI ABDOMEN WITHOUT CONTRAST  (INCLUDING MRCP) TECHNIQUE: Multiplanar multisequence MR imaging of the abdomen was performed. Heavily T2-weighted images of the biliary and pancreatic ducts were obtained, and three-dimensional MRCP images were rendered by post processing. COMPARISON:  CT  scan 08/02/2015 and ultrasound 08/07/2015 FINDINGS: Examination is quite limited due to respiratory motion. Lower chest: The lung bases are grossly clear. No pleural or pericardial effusion. Hepatobiliary: No obvious liver lesions. The gallbladder is normal. Normal caliber and course of the common bile duct. No common bile duct stones. Pancreas: No mass, inflammation or ductal dilatation. I do not see any obvious changes of acute pancreatitis. No peripancreatic fluid collections. Spleen: Normal size.  No focal lesions. Adrenals/Urinary Tract: The adrenal glands and kidneys are unremarkable. Stomach/Bowel: The stomach, duodenum, visualized small bowel and visualized colon are grossly normal. Vascular/Lymphatic: The aorta is normal in caliber. No mesenteric or retroperitoneal mass are adenopathy. Small scattered lymph nodes are stable. Other: No ascites or abdominal wall hernia. Musculoskeletal: No significant bony findings. IMPRESSION: Very limited examination due to respiratory motion. Normal gallbladder and normal caliber and course of the common bile duct. No MR findings to suggest acute pancreatitis and no mass or ductal dilatation. No obvious findings for pancreatic divisum. Electronically Signed   By: Marijo Sanes M.D.   On: 08/08/2015 13:58   Eloise Levels, MD 08/09/2015, 2:49 PM PGY-1, [place] Intern pager:  [phone], text pages welcome

## 2015-08-09 NOTE — Progress Notes (Signed)
Holly Mendez 3:42 PM  Subjective: Patient seen and examined and her hospital computer chart reviewed and her case discussed with my partner Dr. Amedeo Plenty and she woke up this morning feeling better and wants to go home and has no more pain and no more nausea or vomiting and wants to eat  Objective: Vital signs stable afebrile no acute distress abdomen is soft nontender MRCP okay labs stable  Assessment: Improved GI symptoms  Plan: Okay with me to go home and slowly advance diet and avoid alcohol and drug use and my partner Dr. Amedeo Plenty happy to see back when necessary and please call us if we can be of any further assistance with this hospital stay  [facility]  Pager [phone] After 5PM or if no answer call [phone]

## 2015-08-09 NOTE — Discharge Summary (Signed)
[facility] Discharge Summary  Patient name: Holly Mendez Medical record number: [id number] Date of birth: 1975-07-01 Age: 40 y.o. Gender: female Date of Admission: 08/06/2015  Date of Discharge: 08/10/2015 Admitting Physician: Zenia Resides, MD  Primary Care Provider: [name], MD Consultants: Gastroenterology  Indication for Hospitalization: Nausea/vomiting  Discharge Diagnoses/Problem List:  Patient Active Problem List   Diagnosis Date Noted  . Hypokalemia   . Intractable vomiting 08/07/2015  . Nausea & vomiting 08/06/2015  . Constipation   . BV (bacterial vaginosis)   . Epigastric pain 08/04/2015  . Uncontrollable vomiting   . Pancreatitis 08/02/2015  . Back pain, chronic 12/22/2014  . Blind left eye 12/10/2014  . Possiblle Anterior communicating artery aneurysm 10/30/2014  . S/P endometrial ablation 10/17/2014  . Hyperglycemia 10/17/2014  . Morbid obesity ([place]) 09/24/2014  . Headache   . Meningitis, hx, 2016 09/21/2014  . Bipolar affective disorder, currently in remission ([place]) 02/04/2011  . HTN (hypertension) 02/04/2011     Disposition: Home  Discharge Condition: Stable, improved  Discharge Exam:  General: mild distress, lying in hospital bed, obese ENTM: MMM Cardiovascular: RRR Respiratory: NWOB, CTAB Abdomen: Obese, +BS, S, epigastric and RUQ tenderness, non-distended.  MSK: No edema Skin: Warm, dry Neuro: Alert and conversational. No focal neurological deficits.  Psych: Normal affect and thought content  Brief Hospital Course:  Holly Reichle Whitsettis a 40 y.o.femalepresenting with abdominal pain accompanied with nausea and vomiting most likely secondary to ongoing pancreatitis flare for which she was admitted earlier this week (7/24). PMH is significant for chronic pancreatitis, HLD, GERD. Fibromyalgia, bipolar disorder, asthma, possible history of anterior communicating artery aneurysm and obesity.   #Epigastric  pain, nausea, emesis Patient admitted earlier this week (7/24) for similar symptoms and now has bounced back twice. At that time diagnoses was pancreatitis. This admission she endorses multiple episodes of emesis (x20) and epigastric pain mostly radiating to her chest. Unsure of diagnosis at this time. Believe there is a strong psych component with somatic manifestions. However, RUQ US showed mild distension of CBD and some sludge yesterday.  No stones, no pericholecystic fluid or gallbladder wall thickening. Could be a gallbladder etiology.  MRCP normal. Slowly advanced diet to liquids and then soft diet.   #BV Patient also reports clear fluid leaking from vagina during emesis. Vaginal exam was unremarkable, however wet prep revealed clue cells w/ moderate WBC consistent with bacterial vaginosis. Patient denies any other urinary symptoms, no dysuria, increased frequency or urgency. Started treatment of 500mg  metronidazole PO BID for 7 days at the time of discharge.  #Constipation, subacute Patient reports last bowel movement was over a week ago (Wed- 7/19). Patient received an unsuccessful enema on last admission. Patient has been on opiate for pain control which could potentially exacerbate possible constipation though NPO most of this week. Imaging did not show bowel dilation or significant stool burden, however patient has complained about it. Now resolved after multiple BM s/p enema.   Issues for Follow Up:  1. Nausea/vomiting: Patient should have follow up appointment with GI to possibly have EGD.  Last study in 2013 showed gastritis.   2. BV: Patient will continue flagyl 500mg  BID x7 days.  Can follow up if symptomatic.   Significant Procedures: None  Significant Labs and Imaging:   Recent Labs Lab 08/07/15 0043 08/07/15 0324 08/08/15 0945  WBC 12.3* 12.9* 12.5*  HGB 13.8 13.4 14.3  HCT 41.5 39.5 43.4  PLT 312 302 253    Recent  Labs Lab 08/04/15 [id number] 08/05/15 0255  08/06/15 1650 08/07/15 0043 08/07/15 0324 08/08/15 0945  NA 134* 133* 130*  --  134* 134*  K 3.6 3.2* 4.3  --  3.6 3.6  CL 99* 100* 96*  --  100* 98*  CO2 26 26 22   --  26 25  GLUCOSE 145* 132* 147*  --  143* 123*  BUN 6 <5* <5*  --  5* <5*  CREATININE 0.79 0.87 0.90 0.86 1.02* 0.81  CALCIUM 9.2 8.9 9.8  --  9.1 9.2  ALKPHOS 89 80 93  --   --  87  AST 40 32 42*  --   --  28  ALT 38 30 42  --   --  32  ALBUMIN 4.5 3.5 4.6  --   --  4.3    Results/Tests Pending at Time of Discharge: None  Discharge Medications:    Medication List    STOP taking these medications   HYDROcodone-acetaminophen 5-325 MG tablet Commonly known as:  NORCO/VICODIN     TAKE these medications   albuterol 108 (90 Base) MCG/ACT inhaler Commonly known as:  PROVENTIL HFA;VENTOLIN HFA Inhale 2 puffs into the lungs every 6 (six) hours as needed for wheezing or shortness of breath.   amitriptyline 25 MG tablet Commonly known as:  ELAVIL Take on to three tabs at bedtime for back pain What changed:  how much to take  how to take this  when to take this  reasons to take this  additional instructions   calcium carbonate 750 MG chewable tablet Commonly known as:  TUMS EX Chew 1 tablet by mouth daily as needed for heartburn.   cetirizine 10 MG tablet Commonly known as:  ZYRTEC Take 1 tablet (10 mg total) by mouth daily. What changed:  when to take this  reasons to take this   docusate sodium 100 MG capsule Commonly known as:  COLACE Take 1 capsule (100 mg total) by mouth 2 (two) times daily.   fluticasone 50 MCG/ACT nasal spray Commonly known as:  FLONASE Place 2 sprays into both nostrils daily as needed for allergies or rhinitis. What changed:  reasons to take this   metoCLOPramide 10 MG tablet Commonly known as:  REGLAN Take 1 tablet (10 mg total) by mouth 3 (three) times daily as needed for nausea or refractory nausea / vomiting.   metroNIDAZOLE 500 MG tablet Commonly known as:   FLAGYL Take 1 tablet (500 mg total) by mouth 2 (two) times daily.   ondansetron 4 MG disintegrating tablet Commonly known as:  ZOFRAN ODT Take 1 tablet (4 mg total) by mouth every 8 (eight) hours as needed for nausea or vomiting.   pantoprazole 40 MG tablet Commonly known as:  PROTONIX Take 1 tablet (40 mg total) by mouth 2 (two) times daily.   polyethylene glycol packet Commonly known as:  MIRALAX / GLYCOLAX Take 17 g by mouth daily.   SYMBICORT 80-4.5 MCG/ACT inhaler Generic drug:  budesonide-formoterol Inhale 2 puffs into the lungs 2 (two) times daily. What changed:  when to take this  reasons to take this   tizanidine 2 MG capsule Commonly known as:  ZANAFLEX Take 1 capsule (2 mg total) by mouth 2 (two) times daily as needed for muscle spasms.       Discharge Instructions: Please refer to Patient Instructions section of EMR for full details.  Patient was counseled important signs and symptoms that should prompt return to medical care, changes in medications,  dietary instructions, activity restrictions, and follow up appointments.   Follow-Up Appointments: Follow-up Information    [name], MD Follow up on 08/11/2015.   Specialties:  Family Medicine, Sports Medicine Why:  @ 9:15AM Contact information: [id number] [id number] [phone]           Everrett Coombe, MD 08/10/2015, 3:50 PM PGY-1, [place]

## 2015-08-10 MED ORDER — DOCUSATE SODIUM 100 MG PO CAPS: 100.0000 mg | ORAL_CAPSULE | Freq: Two times a day (BID) | ORAL | 0 refills | Status: DC

## 2015-08-10 MED ORDER — PANTOPRAZOLE SODIUM 40 MG PO TBEC: 40.0000 mg | DELAYED_RELEASE_TABLET | Freq: Two times a day (BID) | ORAL | 2 refills | Status: DC

## 2015-08-10 MED ORDER — POLYETHYLENE GLYCOL 3350 17 G PO PACK: 17.0000 g | PACK | Freq: Every day | ORAL | 0 refills | Status: DC

## 2015-08-10 MED ORDER — METRONIDAZOLE 500 MG PO TABS: 500.0000 mg | ORAL_TABLET | Freq: Two times a day (BID) | ORAL | 0 refills | Status: DC

## 2015-08-10 NOTE — Progress Notes (Signed)
Pt ready for discharge to home. Pt. Is alert and oriented. Pt is hemodynamically stable. IV removed. AVS reviewed with pt. Capable of re verbalizing medication regimen. Discharge plan appropriate and in place.

## 2015-08-10 NOTE — Progress Notes (Signed)
Family Medicine Teaching Service Daily Progress Note Intern Pager: [phone]  Patient name: Holly Mendez Medical record number: [id number] Date of birth: 02-Nov-1975 Age: 40 y.o. Gender: female  Primary Care Provider: [name], MD Consultants: None  Code Status: Full   Pt Overview and Major Events to Date:  08/06/15:  Admitted to FMTS under attending Hensel 08/07/15: Recurrent episodes of n/v 08/10/15: N/V Resolved  Assessment and Plan: Holly Coulibaly Whitsettis a 40 y.o.femalepresenting with abdominal pain accompanied with nausea and vomiting most likely secondary to ongoing pancreatitis flare for which she was admitted earlier this week (7/24). PMH is significant for chronic pancreatitis, HLD, GERD. Fibromyalgia, bipolar disorder, asthma, possible history of anterior communicating artery aneurysm and obesity.   #Epigastric pain, nausea, emesis Patient admitted earlier this week (7/24) for similar symptoms and now has bounced back twice. At that time diagnoses was pancreatitis. This admission she endorses multiple episodes of emesis (x20) and epigastric pain mostly radiating to her chest. Unsure of diagnosis at this time. Believe there is a strong psych component with somatic manifestions. However, RUQ US showed mild distension of CBD and some sludge yesterday.  No stones, no pericholecystic fluid or gallbladder wall thickening.  MRCP normal.  GI suggests gastric emptying study. LFT normal.  --tolerating PO last night and this AM - patient reports eating a burger last night, eggs toast and grits this morning --cont Phenergan PRN nausea -- protonix 40mg   BID  -- Avoid narcotics; NSAIDs or tylenol prn for pain --GI saw patient, fine to go home with slowly advancing diet, avoiding alcohol and drug use, may follow up with GI as outpt as needed. Appreciate recs.  #BV Patient also reports clear fluid leaking from vagina during emesis. Vaginal exam was unremarkable, however wet prep revealed  clue cells w/ moderate WBC consistent with bacterial vaginosis. Patient denies any other urinary symptoms, no dysuria, increased frequency or urgency.  - 500 mg PO flagyl BID x 7days started at discharge.    #Constipation, subacute - Resolved Patient reported last bowel movement was over a week ago (Wed- 7/19). Patient received an unsuccessful enema on last admission. Imaging did not show bowel dilation or significant stool burden, however patient has complained about it. Now resolved after multiple BM s/p enema.     #Alcohol abuse.  Last drink was two weeks ago after murder of her brother. Patient with a history of alcohol abuse. --Will monitor for withdrawal symptoms and CIWA if needed  #Mildly elevated BP: Resolved. BP 150s/90s in ED. Likely autonomic response due to pain. No history of hypertension. 112/71 this AM.    #Hyperglycemia: Mildly elevated blood glucose on BMP. No history of diabetes. A1c is 6.3 patient meets pre diabetes criteria. Patient also had some ketones in UA most likely secondary to increase physiologic stress, limited po intake in the past week and excessive vomiting. --Follow up with PCP in the outpatient setting   #Hyperlipidemia: ASCVD risk score 5.3% based on her most recent lipid panel and most recent blood pressure - Would benefit from moderate intensity statin - will defer to PCP  #Fibromyalgia and chronic back pain --Will continue home regimen of amitriptyline, norco and zanaflex prnwhile NPO if patient can tolerate.  #Asthma -- Will continue, Albuterol neb q6 prn  #Bipolar disorder, in remission  --No medications at the moment, well controlled  #Anterior communicating artery aneurysm.MRI/MRA on 10/30/2014 showing probable 18mm ACA aneurysm vs dysplastic ACA. Follow up CTA was recommended and not performed --Follow up CTA as outpatient  FEN/GI: Normal diet PPx: PPI  Disposition: Patient stable for discharge.  Subjective:  Patient  reports feeling better overnight.  No nausea or vomiting. Reports ongoing diarrhea improved with imodium, now has formed stools.   Objective: Temp:  [97.8 F (36.6 C)-98.5 F (36.9 C)] 98.4 F (36.9 C) (08/01 0527) Pulse Rate:  [84-92] 92 (08/01 0527) Resp:  [18-19] 18 (08/01 0527) BP: (112-138)/(71-79) 112/71 (08/01 0527) SpO2:  [95 %-96 %] 95 % (08/01 0527) Physical Exam: General: NAD, well-appearing female appears stated age Cardiovascular: RRR, no murmurs, rubs or gallops Respiratory: clear to auscultation bilaterally, no W/R/R Abdomen: Soft, nontender, nondistended Extremities: Warm and well-perfused, no edema  Laboratory:  Recent Labs Lab 08/07/15 0043 08/07/15 0324 08/08/15 0945  WBC 12.3* 12.9* 12.5*  HGB 13.8 13.4 14.3  HCT 41.5 39.5 43.4  PLT 312 302 253    Recent Labs Lab 08/05/15 0255 08/06/15 1650 08/07/15 0043 08/07/15 0324 08/08/15 0945  NA 133* 130*  --  134* 134*  K 3.2* 4.3  --  3.6 3.6  CL 100* 96*  --  100* 98*  CO2 26 22  --  26 25  BUN <5* <5*  --  5* <5*  CREATININE 0.87 0.90 0.86 1.02* 0.81  CALCIUM 8.9 9.8  --  9.1 9.2  PROT 7.2 8.2*  --   --  8.1  BILITOT 0.7 0.9  --   --  0.6  ALKPHOS 80 93  --   --  87  ALT 30 42  --   --  32  AST 32 42*  --   --  28  GLUCOSE 132* 147*  --  143* 123*      Imaging/Diagnostic Tests: Mr Abdomen Mrcp Wo Cm  Result Date: 08/08/2015 CLINICAL DATA:  Abdominal pain common nausea and vomiting with history of pancreatitis. EXAM: MRI ABDOMEN WITHOUT CONTRAST  (INCLUDING MRCP) TECHNIQUE: Multiplanar multisequence MR imaging of the abdomen was performed. Heavily T2-weighted images of the biliary and pancreatic ducts were obtained, and three-dimensional MRCP images were rendered by post processing. COMPARISON:  CT scan 08/02/2015 and ultrasound 08/07/2015 FINDINGS: Examination is quite limited due to respiratory motion. Lower chest: The lung bases are grossly clear. No pleural or pericardial effusion.  Hepatobiliary: No obvious liver lesions. The gallbladder is normal. Normal caliber and course of the common bile duct. No common bile duct stones. Pancreas: No mass, inflammation or ductal dilatation. I do not see any obvious changes of acute pancreatitis. No peripancreatic fluid collections. Spleen: Normal size.  No focal lesions. Adrenals/Urinary Tract: The adrenal glands and kidneys are unremarkable. Stomach/Bowel: The stomach, duodenum, visualized small bowel and visualized colon are grossly normal. Vascular/Lymphatic: The aorta is normal in caliber. No mesenteric or retroperitoneal mass are adenopathy. Small scattered lymph nodes are stable. Other: No ascites or abdominal wall hernia. Musculoskeletal: No significant bony findings. IMPRESSION: Very limited examination due to respiratory motion. Normal gallbladder and normal caliber and course of the common bile duct. No MR findings to suggest acute pancreatitis and no mass or ductal dilatation. No obvious findings for pancreatic divisum. Electronically Signed   By: Marijo Sanes M.D.   On: 08/08/2015 13:58   Everrett Coombe, MD 08/10/2015, 9:36 AM PGY-1, [place] Intern pager: [phone], text pages welcome

## 2015-08-10 NOTE — Progress Notes (Signed)
Patient is still very upset- unable to do assessment. Patient states she is ready to leave and is waiting on a doctor to come see her.

## 2015-08-10 NOTE — Progress Notes (Signed)
Patient stated that she was told that she could leave early this morning- RN called MD on call to verify. MD asked that patient does not leave until she is seen by MD this morning. Patient is now very upset because she said that her only ride will be here shortly and she will need to leave. MD paged again.

## 2015-08-10 NOTE — Discharge Instructions (Signed)
You were admitted for nausea and vomiting, which may be due to your pancreatitis.   In order to prevent recurrent symptoms in the future, you should make the following dietary adjustments: - Reduce the amount of fat in your diet. (Details below) - Refrain from consuming alcohol. - Avoid smoking and drug use.  Follow up with Dr. Nori Riis on 08/11/2015 at 9 am.   Low-Fat Diet for Pancreatitis or Gallbladder Conditions A low-fat diet can be helpful if you have pancreatitis or a gallbladder condition. With these conditions, your pancreas and gallbladder have trouble digesting fats. A healthy eating plan with less fat will help rest your pancreas and gallbladder and reduce your symptoms. WHAT DO I NEED TO KNOW ABOUT THIS DIET?  Eat a low-fat diet.  Reduce your fat intake to less than 20-30% of your total daily calories. This is less than 50-60 g of fat per day.  Remember that you need some fat in your diet. Ask your dietician what your daily goal should be.  Choose nonfat and low-fat healthy foods. Look for the words "nonfat," "low fat," or "fat free."  As a guide, look on the label and choose foods with less than 3 g of fat per serving. Eat only one serving.  Avoid alcohol.  Do not smoke. If you need help quitting, talk with your health care provider.  Eat small frequent meals instead of three large heavy meals. WHAT FOODS CAN I EAT? Grains Include healthy grains and starches such as potatoes, wheat bread, fiber-rich cereal, and brown rice. Choose whole grain options whenever possible. In adults, whole grains should account for 45-65% of your daily calories.  Fruits and Vegetables Eat plenty of fruits and vegetables. Fresh fruits and vegetables add fiber to your diet. Meats and Other Protein Sources Eat lean meat such as chicken and pork. Trim any fat off of meat before cooking it. Eggs, fish, and beans are other sources of protein. In adults, these foods should account for 10-35% of your  daily calories. Dairy Choose low-fat milk and dairy options. Dairy includes fat and protein, as well as calcium.  Fats and Oils Limit high-fat foods such as fried foods, sweets, baked goods, sugary drinks.  Other Creamy sauces and condiments, such as mayonnaise, can add extra fat. Think about whether or not you need to use them, or use smaller amounts or low fat options. WHAT FOODS ARE NOT RECOMMENDED?  High fat foods, such as:  [employer].  Ice cream.  [place] toast.  Sweet rolls.  Pizza.  Cheese bread.  Foods covered with batter, butter, creamy sauces, or cheese.  Fried foods.  Sugary drinks and desserts.  Foods that cause gas or bloating   This information is not intended to replace advice given to you by your health care provider. Make sure you discuss any questions you have with your health care provider.   Document Released: 12/31/2012 Document Reviewed: 12/31/2012 Elsevier Interactive Patient Education [employer].

## 2015-08-11 ENCOUNTER — Ambulatory Visit (INDEPENDENT_AMBULATORY_CARE_PROVIDER_SITE_OTHER): Payer: Medicaid Other | Admitting: Family Medicine

## 2015-08-11 ENCOUNTER — Encounter: Payer: Self-pay | Admitting: Family Medicine

## 2015-08-11 VITALS — BP 127/81 | HR 89 | Temp 97.8°F | Ht 65.0 in | Wt 261.4 lb

## 2015-08-11 DIAGNOSIS — R1110 Vomiting, unspecified: Secondary | ICD-10-CM | POA: Diagnosis present

## 2015-08-11 DIAGNOSIS — R1013 Epigastric pain: Secondary | ICD-10-CM | POA: Diagnosis not present

## 2015-08-11 MED ORDER — METRONIDAZOLE 500 MG PO TABS: ORAL_TABLET | ORAL | 1 refills | Status: DC

## 2015-08-11 NOTE — Progress Notes (Signed)
    CHIEF COMPLAINT / HPI:  Abdominal pain. Recently hospitalized. Pain is better and she's not having nausea and vomiting like she was but she still has midline epigastric pain. She's also had a lot of constipation which is improving. She has some questions about her medications and about why she's having these problems.  REVIEW OF SYSTEMS: No unusual weight change, fever, sweats, chills. She has abdominal pain as per history of present illness. No melena. She is having bowel movements daily.  OBJECTIVE: Vital signs are reviewed.  GEN.: Well-developed female, overweight, no acute distress CV: Regular rate and rhythm LUNGS: Clear to auscultation bilaterally EXTREMITY: No edema, movement of extremity 4. PSYCH: Alert and oriented 4. Affects interactive. Good eye contact. Asks and answers questions appropriately  ASSESSMENT / PLAN: Please see problem oriented charting for details Additionally she said she did not receive the prescription for her bacterial vaginosis that was supposed to be given to her on discharge from the hospital. I will call that in. She reports having had problems with this in the past. If it does not clear up, she'll let me know.

## 2015-08-11 NOTE — Patient Instructions (Signed)
I will send in a referral for the stomach doctor If you have not heard from them in a week give me a call Start some fiber supplement daily---miralax or citrucel or mettamucil. Start with a tablespoon daily and double uit every 5-7 days I am calling in some flagyl for the bacterial vaginosis Let me see you back in 4-6 weeks

## 2015-08-11 NOTE — Assessment & Plan Note (Signed)
Unclear why she's had this recurrent abdominal pain. She has been admitted twice recently. Initial diagnosis was pancreatitis but imaging studies and laboratory values did not indicate pancreatitis. She was seen by Dr. Amedeo Plenty from gastroenterology at last hospitalization and they recommended outpatient follow-up with possible EGD. I will refer her for that. I spent greater than 50% of our 25 minute office visit in counseling education regarding appropriate diet, fiber supplementation and the need to gradually increase that. We also discussed her medications. I will see her back in 4-6 weeks.

## 2015-08-25 ENCOUNTER — Other Ambulatory Visit: Payer: Self-pay | Admitting: *Deleted

## 2015-08-25 MED ORDER — ALBUTEROL SULFATE HFA 108 (90 BASE) MCG/ACT IN AERS: 2.0000 | INHALATION_SPRAY | Freq: Four times a day (QID) | RESPIRATORY_TRACT | 12 refills | Status: DC | PRN

## 2015-08-26 ENCOUNTER — Ambulatory Visit: Payer: Medicaid Other

## 2015-09-10 ENCOUNTER — Ambulatory Visit
Admission: RE | Admit: 2015-09-10 | Discharge: 2015-09-10 | Disposition: A | Discharge status: Home / Self Care | Payer: Medicaid Other | Source: Ambulatory Visit

## 2015-09-10 DIAGNOSIS — Z1231 Encounter for screening mammogram for malignant neoplasm of breast: Secondary | ICD-10-CM

## 2015-09-15 ENCOUNTER — Ambulatory Visit (INDEPENDENT_AMBULATORY_CARE_PROVIDER_SITE_OTHER): Payer: Medicaid Other | Admitting: Family Medicine

## 2015-09-15 ENCOUNTER — Encounter: Payer: Self-pay | Admitting: Family Medicine

## 2015-09-15 VITALS — BP 126/81 | HR 98 | Temp 98.0°F | Ht 65.0 in | Wt 263.2 lb

## 2015-09-15 DIAGNOSIS — Z23 Encounter for immunization: Secondary | ICD-10-CM | POA: Diagnosis not present

## 2015-09-15 DIAGNOSIS — M549 Dorsalgia, unspecified: Secondary | ICD-10-CM | POA: Diagnosis not present

## 2015-09-15 DIAGNOSIS — R1013 Epigastric pain: Secondary | ICD-10-CM | POA: Diagnosis not present

## 2015-09-15 DIAGNOSIS — K5901 Slow transit constipation: Secondary | ICD-10-CM | POA: Diagnosis not present

## 2015-09-15 DIAGNOSIS — G8929 Other chronic pain: Secondary | ICD-10-CM | POA: Diagnosis not present

## 2015-09-15 NOTE — Patient Instructions (Signed)
Try to remain active throughout this episode of back pain is I think that will help overall. Continue her daily fiber. It was great to see you.

## 2015-09-15 NOTE — Assessment & Plan Note (Signed)
She reports having had an MRI sometime in the past medical see that our record. She also reports having some type of spinal injection in the past through pain clinic but she's not sure if that was a trigger point injection, epidural steroid injection or facet injection. She also does not recall whether or not it seemed to help.  I think she just having a flare of some facet arthritis today. We discussed about activity being important. I'll continue the tizanidine. DC amitriptyline as that will not help her GI problems anyway and doesn't seem to be helping her as much is the muscle relaxer. Follow-up when necessary for back issues. Should it worsen, would probably get MRI.

## 2015-09-15 NOTE — Assessment & Plan Note (Signed)
This seems resolved. I reviewed her MRCP results which are totally normal with her. She seems relieved by this.

## 2015-09-15 NOTE — Addendum Note (Signed)
Addended by: Londell Moh T on: 09/15/2015 02:54 PM   Modules accepted: Orders, SmartSet

## 2015-09-15 NOTE — Assessment & Plan Note (Signed)
Thick GI doctor placed her on daily MiraLAX and I recommended continuing that without missing a day. We discussed slow transit.

## 2015-09-15 NOTE — Progress Notes (Signed)
    CHIEF COMPLAINT / HPI:   1. Back pain: same as usual but having a flare over the last few days. She attributes this to the weather. Pain in low back bilateral, not currently radiating to the leg or buttock. No leg weakness, no change in bowel or bladder habits. Pain 4 out of 10. The muscle relaxer is helping. It works better for her than the amitriptyline and the amitriptyline seems to give her dry mouth so she's rather take the tizanidine #2. Epigastric/abdominal pain: Better. She wants to discuss results of her recent workup by gastroenterology. They have placed her on daily fiber and that seems to be helping. She has a few questions about that. She says overall her abdominal pain is 90-95% better #3. Health maintenance: She had her mammogram yesterday wants to get results from that as well as check up on her other preventive needs.  REVIEW OF SYSTEMS:  No unusual weight change, fever, sweats, chills. See history of present illness above for additional pertinent review of systems  OBJECTIVE:  Vital signs are reviewed.   GEN.: Well-developed female no acute distress CV: Regular rate and rhythm  ABDOMEN: Soft, positive bowel sounds nontender nondistended no rebound or guarding BACK: Spine appears to be normally aligned. No soft tissue defect, no swelling. Limited flexion at the hips secondary to stiffness. Negative straight leg raise bilaterally. Contrast chair without difficulty although she seems to be somewhat stiff. 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 PSYCH: Alert and oriented 4. Affects a little bit flat. Asks answers questions appropriately. Speech is normal in content and fluency. ASSESSMENT / PLAN: Please see problem oriented charting for details  Preventive health needs: Will give her updated tetanus and flu shot today. We reviewed her normal  mammogram results.

## 2015-09-21 ENCOUNTER — Telehealth: Payer: Self-pay | Admitting: Internal Medicine

## 2015-09-21 ENCOUNTER — Emergency Department (HOSPITAL_COMMUNITY): Payer: Medicaid Other

## 2015-09-21 ENCOUNTER — Encounter (HOSPITAL_COMMUNITY): Payer: Self-pay | Admitting: Emergency Medicine

## 2015-09-21 ENCOUNTER — Emergency Department (HOSPITAL_COMMUNITY)
Admission: EM | Admit: 2015-09-21 | Discharge: 2015-09-21 | Disposition: A | Discharge status: Home / Self Care | Payer: Medicaid Other | Attending: Emergency Medicine | Admitting: Emergency Medicine

## 2015-09-21 DIAGNOSIS — J45909 Unspecified asthma, uncomplicated: Secondary | ICD-10-CM | POA: Insufficient documentation

## 2015-09-21 DIAGNOSIS — R1084 Generalized abdominal pain: Secondary | ICD-10-CM

## 2015-09-21 DIAGNOSIS — Z87891 Personal history of nicotine dependence: Secondary | ICD-10-CM | POA: Diagnosis not present

## 2015-09-21 DIAGNOSIS — K5900 Constipation, unspecified: Secondary | ICD-10-CM

## 2015-09-21 DIAGNOSIS — I10 Essential (primary) hypertension: Secondary | ICD-10-CM | POA: Insufficient documentation

## 2015-09-21 DIAGNOSIS — R1013 Epigastric pain: Secondary | ICD-10-CM | POA: Diagnosis present

## 2015-09-21 LAB — POC URINE PREG, ED: PREG TEST UR: NEGATIVE

## 2015-09-21 LAB — CBC
HCT: 39.9 % (ref 36.0–46.0)
Hemoglobin: 13.5 g/dL (ref 12.0–15.0)
MCH: 27.9 pg (ref 26.0–34.0)
MCHC: 33.8 g/dL (ref 30.0–36.0)
MCV: 82.4 fL (ref 78.0–100.0)
PLATELETS: 300 10*3/uL (ref 150–400)
RBC: 4.84 MIL/uL (ref 3.87–5.11)
RDW: 14 % (ref 11.5–15.5)
WBC: 9.4 10*3/uL (ref 4.0–10.5)

## 2015-09-21 LAB — COMPREHENSIVE METABOLIC PANEL
ALBUMIN: 4 g/dL (ref 3.5–5.0)
ALK PHOS: 114 U/L (ref 38–126)
ALT: 50 U/L (ref 14–54)
ANION GAP: 11 (ref 5–15)
AST: 40 U/L (ref 15–41)
BILIRUBIN TOTAL: 1.4 mg/dL — AB (ref 0.3–1.2)
BUN: 7 mg/dL (ref 6–20)
CALCIUM: 9.4 mg/dL (ref 8.9–10.3)
CO2: 27 mmol/L (ref 22–32)
CREATININE: 0.9 mg/dL (ref 0.44–1.00)
Chloride: 97 mmol/L — ABNORMAL LOW (ref 101–111)
GFR calc Af Amer: >60 mL/min (ref >60)
GFR calc non Af Amer: >60 mL/min (ref >60)
GLUCOSE: 165 mg/dL — AB (ref 65–99)
Potassium: 4 mmol/L (ref 3.5–5.1)
SODIUM: 135 mmol/L (ref 135–145)
TOTAL PROTEIN: 8.1 g/dL (ref 6.5–8.1)

## 2015-09-21 LAB — URINALYSIS, ROUTINE W REFLEX MICROSCOPIC
Glucose, UA: NEGATIVE mg/dL
HGB URINE DIPSTICK: NEGATIVE
KETONES UR: 15 mg/dL — AB
Leukocytes, UA: NEGATIVE
NITRITE: NEGATIVE
PROTEIN: 100 mg/dL — AB
pH: 6 (ref 5.0–8.0)

## 2015-09-21 LAB — URINE MICROSCOPIC-ADD ON: RBC / HPF: NONE SEEN RBC/hpf (ref 0–5)

## 2015-09-21 LAB — LIPASE, BLOOD: Lipase: 83 U/L — ABNORMAL HIGH (ref 11–51)

## 2015-09-21 MED ORDER — KETOROLAC TROMETHAMINE 30 MG/ML IJ SOLN
30.0000 mg | Freq: Once | INTRAMUSCULAR | Status: AC
  Administered 2015-09-21: 30 mg via INTRAVENOUS
  Filled 2015-09-21: qty 1

## 2015-09-21 MED ORDER — SODIUM CHLORIDE 0.9 % IV BOLUS (SEPSIS)
1000.0000 mL | Freq: Once | INTRAVENOUS | Status: AC
  Administered 2015-09-21: 1000 mL via INTRAVENOUS

## 2015-09-21 MED ORDER — METOCLOPRAMIDE HCL 10 MG PO TABS: 10.0000 mg | ORAL_TABLET | Freq: Four times a day (QID) | ORAL | 0 refills | Status: DC

## 2015-09-21 MED ORDER — METOCLOPRAMIDE HCL 5 MG/ML IJ SOLN
10.0000 mg | Freq: Once | INTRAMUSCULAR | Status: AC
  Administered 2015-09-21: 10 mg via INTRAVENOUS
  Filled 2015-09-21: qty 2

## 2015-09-21 MED ORDER — HALOPERIDOL LACTATE 5 MG/ML IJ SOLN
4.0000 mg | Freq: Once | INTRAMUSCULAR | Status: AC
  Administered 2015-09-21: 4 mg via INTRAVENOUS
  Filled 2015-09-21: qty 1

## 2015-09-21 NOTE — Telephone Encounter (Signed)
After Hours Phone Call:   Patient called emergency clinic line reporting severe abdominal pain. Has history of pancreatitis and reports this pain feels similar. She states she has thrown up approximately 25 times in the past couple days. Has not had anything to eat or drink for 48 hours and feels very dehydrated. Advised patient to come to the ED for further evaluation. Patient voiced understanding and reported that she would be on her way now.   Phill Myron, D.O. 09/21/2015, 5:29 AM PGY-2, [facility]

## 2015-09-21 NOTE — ED Triage Notes (Signed)
Pt arrives with N/V abdominal pain ongoing x72 hours, reports hx of pancreatitis. Reports 25+ episodes of vomiting in the last 24 hours. No BM in 5 days, states she knows she's impacted.
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
## 2015-09-22 ENCOUNTER — Emergency Department (HOSPITAL_COMMUNITY): Payer: Medicaid Other

## 2015-09-22 ENCOUNTER — Emergency Department (HOSPITAL_COMMUNITY)
Admission: EM | Admit: 2015-09-22 | Discharge: 2015-09-22 | Disposition: A | Discharge status: Home / Self Care | Payer: Medicaid Other | Source: Home / Self Care | Attending: Emergency Medicine | Admitting: Emergency Medicine

## 2015-09-22 ENCOUNTER — Encounter (HOSPITAL_COMMUNITY): Payer: Self-pay | Admitting: Neurology

## 2015-09-22 ENCOUNTER — Encounter (HOSPITAL_COMMUNITY): Payer: Self-pay

## 2015-09-22 ENCOUNTER — Emergency Department (HOSPITAL_COMMUNITY)
Admission: EM | Admit: 2015-09-22 | Discharge: 2015-09-23 | Disposition: A | Discharge status: Home / Self Care | Payer: Medicaid Other | Attending: Emergency Medicine | Admitting: Emergency Medicine

## 2015-09-22 DIAGNOSIS — J45909 Unspecified asthma, uncomplicated: Secondary | ICD-10-CM | POA: Insufficient documentation

## 2015-09-22 DIAGNOSIS — R112 Nausea with vomiting, unspecified: Secondary | ICD-10-CM | POA: Insufficient documentation

## 2015-09-22 DIAGNOSIS — Z87891 Personal history of nicotine dependence: Secondary | ICD-10-CM | POA: Insufficient documentation

## 2015-09-22 DIAGNOSIS — R1084 Generalized abdominal pain: Secondary | ICD-10-CM | POA: Diagnosis present

## 2015-09-22 DIAGNOSIS — I10 Essential (primary) hypertension: Secondary | ICD-10-CM | POA: Insufficient documentation

## 2015-09-22 DIAGNOSIS — Z79899 Other long term (current) drug therapy: Secondary | ICD-10-CM

## 2015-09-22 DIAGNOSIS — R109 Unspecified abdominal pain: Secondary | ICD-10-CM

## 2015-09-22 DIAGNOSIS — Z7951 Long term (current) use of inhaled steroids: Secondary | ICD-10-CM | POA: Diagnosis not present

## 2015-09-22 LAB — CBC
HCT: 39.9 % (ref 36.0–46.0)
HEMOGLOBIN: 13.9 g/dL (ref 12.0–15.0)
MCH: 27.7 pg (ref 26.0–34.0)
MCHC: 34.8 g/dL (ref 30.0–36.0)
MCV: 79.5 fL (ref 78.0–100.0)
Platelets: 324 10*3/uL (ref 150–400)
RBC: 5.02 MIL/uL (ref 3.87–5.11)
RDW: 13.9 % (ref 11.5–15.5)
WBC: 10.1 10*3/uL (ref 4.0–10.5)

## 2015-09-22 LAB — URINALYSIS, ROUTINE W REFLEX MICROSCOPIC
Glucose, UA: NEGATIVE mg/dL
Hgb urine dipstick: NEGATIVE
Ketones, ur: NEGATIVE mg/dL
LEUKOCYTES UA: NEGATIVE
NITRITE: NEGATIVE
Protein, ur: 30 mg/dL — AB
SPECIFIC GRAVITY, URINE: 1.028 (ref 1.005–1.030)
pH: 6 (ref 5.0–8.0)

## 2015-09-22 LAB — CBC WITH DIFFERENTIAL/PLATELET
BASOS PCT: 1 %
Basophils Absolute: 0 10*3/uL (ref 0.0–0.1)
EOS ABS: 0 10*3/uL (ref 0.0–0.7)
EOS PCT: 0 %
HCT: 39.8 % (ref 36.0–46.0)
Hemoglobin: 13.5 g/dL (ref 12.0–15.0)
LYMPHS ABS: 2.8 10*3/uL (ref 0.7–4.0)
Lymphocytes Relative: 36 %
MCH: 27.7 pg (ref 26.0–34.0)
MCHC: 33.9 g/dL (ref 30.0–36.0)
MCV: 81.6 fL (ref 78.0–100.0)
Monocytes Absolute: 0.5 10*3/uL (ref 0.1–1.0)
Monocytes Relative: 6 %
NEUTROS PCT: 57 %
Neutro Abs: 4.4 10*3/uL (ref 1.7–7.7)
PLATELETS: 274 10*3/uL (ref 150–400)
RBC: 4.88 MIL/uL (ref 3.87–5.11)
RDW: 14 % (ref 11.5–15.5)
WBC: 7.8 10*3/uL (ref 4.0–10.5)

## 2015-09-22 LAB — COMPREHENSIVE METABOLIC PANEL
ALBUMIN: 3.6 g/dL (ref 3.5–5.0)
ALK PHOS: 110 U/L (ref 38–126)
ALT: 70 U/L — ABNORMAL HIGH (ref 14–54)
ALT: 77 U/L — ABNORMAL HIGH (ref 14–54)
ANION GAP: 13 (ref 5–15)
ANION GAP: 10 (ref 5–15)
AST: 109 U/L — ABNORMAL HIGH (ref 15–41)
AST: 90 U/L — ABNORMAL HIGH (ref 15–41)
Albumin: 4.2 g/dL (ref 3.5–5.0)
Alkaline Phosphatase: 100 U/L (ref 38–126)
BILIRUBIN TOTAL: 2 mg/dL — AB (ref 0.3–1.2)
BUN: <5 mg/dL — ABNORMAL LOW (ref 6–20)
BUN: 8 mg/dL (ref 6–20)
CALCIUM: 9.3 mg/dL (ref 8.9–10.3)
CHLORIDE: 97 mmol/L — AB (ref 101–111)
CO2: 23 mmol/L (ref 22–32)
CO2: 28 mmol/L (ref 22–32)
Calcium: 9.2 mg/dL (ref 8.9–10.3)
Chloride: 95 mmol/L — ABNORMAL LOW (ref 101–111)
Creatinine, Ser: 0.76 mg/dL (ref 0.44–1.00)
Creatinine, Ser: 0.9 mg/dL (ref 0.44–1.00)
GFR calc non Af Amer: >60 mL/min (ref >60)
GFR calc non Af Amer: >60 mL/min (ref >60)
GLUCOSE: 130 mg/dL — AB (ref 65–99)
Glucose, Bld: 145 mg/dL — ABNORMAL HIGH (ref 65–99)
Potassium: 3.7 mmol/L (ref 3.5–5.1)
Potassium: 5 mmol/L (ref 3.5–5.1)
SODIUM: 133 mmol/L — AB (ref 135–145)
SODIUM: 133 mmol/L — AB (ref 135–145)
TOTAL PROTEIN: 8.5 g/dL — AB (ref 6.5–8.1)
Total Bilirubin: 1.2 mg/dL (ref 0.3–1.2)
Total Protein: 7.1 g/dL (ref 6.5–8.1)

## 2015-09-22 LAB — URINE CULTURE

## 2015-09-22 LAB — URINE MICROSCOPIC-ADD ON
BACTERIA UA: NONE SEEN
RBC / HPF: NONE SEEN RBC/hpf (ref 0–5)
WBC UA: NONE SEEN WBC/hpf (ref 0–5)

## 2015-09-22 LAB — LIPASE, BLOOD: Lipase: 20 U/L (ref 11–51)

## 2015-09-22 MED ORDER — SODIUM CHLORIDE 0.9 % IV BOLUS (SEPSIS)
2000.0000 mL | Freq: Once | INTRAVENOUS | Status: AC
  Administered 2015-09-23: 2000 mL via INTRAVENOUS

## 2015-09-22 MED ORDER — SODIUM CHLORIDE 0.9 % IV BOLUS (SEPSIS): 1000.0000 mL | Freq: Once | INTRAVENOUS | Status: DC

## 2015-09-22 MED ORDER — MAGNESIUM CITRATE PO SOLN
1.0000 | Freq: Once | ORAL | Status: AC
  Administered 2015-09-23: 1 via ORAL
  Filled 2015-09-22: qty 296
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
## 2015-09-22 NOTE — Discharge Instructions (Signed)
Please read and follow all provided instructions.  Your diagnoses today include:  1. Abdominal pain, unspecified abdominal location     Tests performed today include: Blood counts and electrolytes Blood tests to check liver and kidney function Vital signs. See below for your results today.   Medications prescribed:   Take any prescribed medications only as directed.  Home care instructions:  Follow any educational materials contained in this packet.  Follow-up instructions: Please follow-up with your primary care provider in the next 2 days for further evaluation of your symptoms.    Return instructions:  SEEK IMMEDIATE MEDICAL ATTENTION IF: The pain does not go away or becomes severe  A temperature above 101F develops  Repeated vomiting occurs (multiple episodes)  The pain becomes localized to portions of the abdomen. The right side could possibly be appendicitis. In an adult, the left lower portion of the abdomen could be colitis or diverticulitis.  Blood is being passed in stools or vomit (bright red or black tarry stools)  You develop chest pain, difficulty breathing, dizziness or fainting, or become confused, poorly responsive, or inconsolable (young children) If you have any other emergent concerns regarding your health  Additional Information: Abdominal (belly) pain can be caused by many things. Your caregiver performed an examination and possibly ordered blood/urine tests and imaging (CT scan, x-rays, ultrasound). Many cases can be observed and treated at home after initial evaluation in the emergency department. Even though you are being discharged home, abdominal pain can be unpredictable. Therefore, you need a repeated exam if your pain does not resolve, returns, or worsens. Most patients with abdominal pain don't have to be admitted to the hospital or have surgery, but serious problems like appendicitis and gallbladder attacks can start out as nonspecific pain. Many  abdominal conditions cannot be diagnosed in one visit, so follow-up evaluations are very important.  Your vital signs today were: BP 113/76 (BP Location: Right Arm)    Pulse 91    Temp 98.5 F (36.9 C) (Oral)    Resp 16    Ht 5\' 5"  (1.651 m)    Wt 119.3 kg    SpO2 98%    BMI 43.77 kg/m  If your blood pressure (bp) was elevated above 135/85 this visit, please have this repeated by your doctor within one month. --------------

## 2015-09-22 NOTE — ED Notes (Addendum)
Pt reports she is ready to leave and doesn't want IV fluids. Dorothea Ogle, [place] made aware. Want pt to try and eat saltine crackers and make sure she can tolerate well. Pt given water and crackers.
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
## 2015-09-22 NOTE — ED Triage Notes (Signed)
Pt BIB [facility] c/o abdominal pain. Pt was seen at [facility] yesterday and this morning. [facility] recommended an enema and pt declined this morning. Hx of pancreatitis. LBM last Thursday (9/7). A&Ox4. Endorses N/V.

## 2015-09-22 NOTE — ED Triage Notes (Signed)
Per ems- pt is coming from GI today where she had f/u appointment after being d/c from ED yesterday. C/o n/v x 5 days. Had constipation yesterday but took miralax and is now having diarrhea. While at the doctors office, she reports she started shaking and fell in the floor, prompting the doctor to call 911. Thinks she is so dehydrated and tastes medicine in her mouth. Pt is a x 4. In NAD

## 2015-09-23 MED ORDER — ONDANSETRON HCL 4 MG/2ML IJ SOLN
4.0000 mg | Freq: Once | INTRAMUSCULAR | Status: AC
  Administered 2015-09-23: 4 mg via INTRAVENOUS
  Filled 2015-09-23: qty 2

## 2015-09-23 MED ORDER — ONDANSETRON 8 MG PO TBDP
8.0000 mg | ORAL_TABLET | Freq: Once | ORAL | Status: AC
  Administered 2015-09-23: 8 mg via ORAL
  Filled 2015-09-23: qty 1

## 2015-09-23 MED ORDER — PROMETHAZINE HCL 25 MG RE SUPP: 25.0000 mg | Freq: Four times a day (QID) | RECTAL | 0 refills | Status: DC | PRN

## 2015-09-23 NOTE — ED Notes (Signed)
Patient has sipped small amount of ginger ale but hesitant to drink. Patient has refused to drink remainder of mag citrate.

## 2015-09-23 NOTE — ED Notes (Signed)
Patient vomited first half of mag citrate. Unable to drink remainder at this time.

## 2015-09-23 NOTE — Discharge Instructions (Signed)
We saw you in the ER for the abdominal pain. °All of our results are normal, including all labs and imaging. Kidney function is fine as well. °We are not sure what is causing your abdominal pain, and recommend that you see your primary care doctor within 2-3 days for further evaluation. °If your symptoms get worse, return to the ER. ° ° ° °

## 2015-09-23 NOTE — ED Notes (Signed)
Pt ambulated to the bathroom.  

## 2015-09-28 ENCOUNTER — Other Ambulatory Visit: Payer: Self-pay | Admitting: Student

## 2015-10-04 ENCOUNTER — Other Ambulatory Visit: Payer: Self-pay | Admitting: Gastroenterology

## 2015-10-05 ENCOUNTER — Encounter (HOSPITAL_COMMUNITY): Payer: Self-pay | Admitting: *Deleted

## 2015-10-08 ENCOUNTER — Ambulatory Visit (HOSPITAL_COMMUNITY)
Admission: RE | Admit: 2015-10-08 | Discharge: 2015-10-08 | Disposition: A | Discharge status: Home / Self Care | Payer: Medicaid Other | Source: Ambulatory Visit | Attending: Gastroenterology | Admitting: Gastroenterology

## 2015-10-08 ENCOUNTER — Ambulatory Visit (HOSPITAL_COMMUNITY): Payer: Medicaid Other | Admitting: Anesthesiology

## 2015-10-08 ENCOUNTER — Encounter (HOSPITAL_COMMUNITY): Payer: Self-pay | Admitting: *Deleted

## 2015-10-08 ENCOUNTER — Encounter (HOSPITAL_COMMUNITY)
Admission: RE | Disposition: A | Discharge status: Home / Self Care | Payer: Self-pay | Source: Ambulatory Visit | Attending: Gastroenterology

## 2015-10-08 DIAGNOSIS — Z970 Presence of artificial eye: Secondary | ICD-10-CM | POA: Diagnosis not present

## 2015-10-08 DIAGNOSIS — K920 Hematemesis: Secondary | ICD-10-CM | POA: Insufficient documentation

## 2015-10-08 DIAGNOSIS — R1084 Generalized abdominal pain: Secondary | ICD-10-CM | POA: Diagnosis not present

## 2015-10-08 DIAGNOSIS — I739 Peripheral vascular disease, unspecified: Secondary | ICD-10-CM | POA: Diagnosis not present

## 2015-10-08 DIAGNOSIS — K5900 Constipation, unspecified: Secondary | ICD-10-CM | POA: Diagnosis not present

## 2015-10-08 DIAGNOSIS — R112 Nausea with vomiting, unspecified: Secondary | ICD-10-CM | POA: Diagnosis present

## 2015-10-08 DIAGNOSIS — H5442 Blindness, left eye, normal vision right eye: Secondary | ICD-10-CM | POA: Diagnosis not present

## 2015-10-08 DIAGNOSIS — M479 Spondylosis, unspecified: Secondary | ICD-10-CM | POA: Diagnosis not present

## 2015-10-08 DIAGNOSIS — Z8719 Personal history of other diseases of the digestive system: Secondary | ICD-10-CM | POA: Diagnosis not present

## 2015-10-08 DIAGNOSIS — F17210 Nicotine dependence, cigarettes, uncomplicated: Secondary | ICD-10-CM | POA: Insufficient documentation

## 2015-10-08 HISTORY — PX: EUS: SHX5427

## 2015-10-08 SURGERY — UPPER ENDOSCOPIC ULTRASOUND (EUS) LINEAR
Anesthesia: Monitor Anesthesia Care

## 2015-10-08 MED ORDER — PROPOFOL 10 MG/ML IV BOLUS
INTRAVENOUS | Status: AC
  Filled 2015-10-08: qty 20

## 2015-10-08 MED ORDER — PROPOFOL 10 MG/ML IV BOLUS
INTRAVENOUS | Status: AC
  Filled 2015-10-08: qty 40

## 2015-10-08 MED ORDER — PROPOFOL 500 MG/50ML IV EMUL
INTRAVENOUS | Status: DC | PRN
  Administered 2015-10-08: 125 ug/kg/min via INTRAVENOUS

## 2015-10-08 MED ORDER — SODIUM CHLORIDE 0.9 % IV SOLN: INTRAVENOUS | Status: DC

## 2015-10-08 MED ORDER — PROPOFOL 500 MG/50ML IV EMUL
INTRAVENOUS | Status: DC | PRN
  Administered 2015-10-08 (×3): 30 mg via INTRAVENOUS

## 2015-10-08 MED ORDER — LACTATED RINGERS IV SOLN
INTRAVENOUS | Status: DC
  Administered 2015-10-08: 1000 mL via INTRAVENOUS

## 2015-10-08 NOTE — Anesthesia Preprocedure Evaluation (Signed)
Anesthesia Evaluation  Patient identified by MRN, date of birth, ID band Patient awake    Reviewed: Allergy & Precautions, H&P , NPO status , Patient's Chart, lab work & pertinent test results  History of Anesthesia Complications Negative for: history of anesthetic complications  Airway Mallampati: II  TM Distance: >3 FB Neck ROM: full    Dental no notable dental hx.    Pulmonary asthma , Current Smoker,    Pulmonary exam normal breath sounds clear to auscultation       Cardiovascular hypertension, + Peripheral Vascular Disease  Normal cardiovascular exam Rhythm:regular Rate:Normal     Neuro/Psych  Headaches, PSYCHIATRIC DISORDERS Bipolar Disorder Schizophrenia    GI/Hepatic Neg liver ROS, GERD  ,  Endo/Other  negative endocrine ROS  Renal/GU negative Renal ROS     Musculoskeletal  (+) Arthritis , Fibromyalgia -  Abdominal   Peds  Hematology negative hematology ROS (+)   Anesthesia Other Findings   Reproductive/Obstetrics negative OB ROS                             Anesthesia Physical Anesthesia Plan  ASA: III  Anesthesia Plan: MAC   Post-op Pain Management:    Induction: Intravenous  Airway Management Planned: Nasal Cannula  Additional Equipment:   Intra-op Plan:   Post-operative Plan:   Informed Consent: I have reviewed the patients History and Physical, chart, labs and discussed the procedure including the risks, benefits and alternatives for the proposed anesthesia with the patient or authorized representative who has indicated his/her understanding and acceptance.   Dental Advisory Given  Plan Discussed with: Anesthesiologist, CRNA and Surgeon  Anesthesia Plan Comments: (Blind left eye, prosthetic now)        Anesthesia Quick Evaluation

## 2015-10-08 NOTE — Discharge Instructions (Signed)
YOU HAD AN ENDOSCOPIC PROCEDURE TODAY: Refer to the procedure report and other information in the discharge instructions given to you for any specific questions about what was found during the examination. If this information does not answer your questions, please call [facility] at [phone] to clarify.  ° °YOU SHOULD EXPECT: Some feelings of bloating in the abdomen. Passage of more gas than usual. Walking can help get rid of the air that was put into your GI tract during the procedure and reduce the bloating. If you had a lower endoscopy (such as a colonoscopy or flexible sigmoidoscopy) you may notice spotting of blood in your stool or on the toilet paper. Some abdominal soreness may be present for a day or two, also. ° °DIET: Your first meal following the procedure should be a light meal and then it is ok to progress to your normal diet. A half-sandwich or bowl of soup is an example of a good first meal. Heavy or fried foods are harder to digest and may make you feel nauseous or bloated. Drink plenty of fluids but you should avoid alcoholic beverages for 24 hours. If you had an esophageal dilation, please see attached information for diet.  ° °ACTIVITY: Your care partner should take you home directly after the procedure. You should plan to take it easy, moving slowly for the rest of the day. You can resume normal activity the day after the procedure however YOU SHOULD NOT DRIVE, use power tools, machinery or perform tasks that involve climbing or major physical exertion for 24 hours (because of the sedation medicines used during the test).  ° °SYMPTOMS TO REPORT IMMEDIATELY: °A gastroenterologist can be reached at any hour. Please call [phone]  for any of the following symptoms:  °Following lower endoscopy (colonoscopy, flexible sigmoidoscopy) °Excessive amounts of blood in the stool  °Significant tenderness, worsening of abdominal pains  °Swelling of the abdomen that is new, acute  °Fever of  100° or higher  °Following upper endoscopy (EGD, EUS, ERCP, esophageal dilation) °Vomiting of blood or coffee ground material  °New, significant abdominal pain  °New, significant chest pain or pain under the shoulder blades  °Painful or persistently difficult swallowing  °New shortness of breath  °Black, tarry-looking or red, bloody stools ° °FOLLOW UP:  °If any biopsies were taken you will be contacted by phone or by letter within the next 1-3 weeks. Call [phone]  if you have not heard about the biopsies in 3 weeks.  °Please also call with any specific questions about appointments or follow up tests. ° °

## 2015-10-08 NOTE — Transfer of Care (Signed)
Immediate Anesthesia Transfer of Care Note  Patient: Holly Mendez  Procedure(s) Performed: Procedure(s): UPPER ENDOSCOPIC ULTRASOUND (EUS) LINEAR (N/A)  Patient Location: PACU  Anesthesia Type:MAC  Level of Consciousness: awake, alert  and oriented  Airway & Oxygen Therapy: Patient Spontanous Breathing and Patient connected to nasal cannula oxygen  Post-op Assessment: Report given to RN and Post -op Vital signs reviewed and stable  Post vital signs: Reviewed and stable  Last Vitals:  Vitals:   10/08/15 1002 10/08/15 1117  BP: (!) 135/92 (!) 112/92  Pulse: 98 (!) 101  Resp: 20 20  Temp: 36.4 C     Last Pain:  Vitals:   10/08/15 1117  TempSrc: Oral         Complications: No apparent anesthesia complications
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
## 2015-10-08 NOTE — Anesthesia Postprocedure Evaluation (Signed)
Anesthesia Post Note  Patient: Odell Broccoli Brown-Klee  Procedure(s) Performed: Procedure(s) (LRB): UPPER ENDOSCOPIC ULTRASOUND (EUS) LINEAR (N/A)  Patient location during evaluation: PACU Anesthesia Type: MAC Level of consciousness: awake and alert Pain management: pain level controlled Vital Signs Assessment: post-procedure vital signs reviewed and stable Respiratory status: spontaneous breathing, nonlabored ventilation, respiratory function stable and patient connected to nasal cannula oxygen Cardiovascular status: stable and blood pressure returned to baseline Anesthetic complications: no    Last Vitals:  Vitals:   10/08/15 1002 10/08/15 1117  BP: (!) 135/92 (!) 112/92  Pulse: 98 (!) 101  Resp: 20 20  Temp: 36.4 C 36.7 C    Last Pain:  Vitals:   10/08/15 1117  TempSrc: Oral                 Zenaida Deed

## 2015-10-08 NOTE — Op Note (Signed)
[facility] Patient Name: Holly Mendez Procedure Date: 10/08/2015 MRN: [id number] Attending MD: Carol Ada , MD Date of Birth: 1975-04-19 CSN: [id number] Age: 40 Admit Type: Outpatient Procedure:                Upper EUS Indications:              Suspected chronic pancreatitis, Generalized                            abdominal distress Providers:                Carol Ada, MD, Elmer Ramp. Tilden Dome, RN, Ralene Bathe,                            Technician, Herbie Drape, CRNA Referring MD:              Medicines:                Propofol per Anesthesia Complications:            No immediate complications. Estimated Blood Loss:     Estimated blood loss: none. Procedure:                Pre-Anesthesia Assessment:                           - Prior to the procedure, a History and Physical                            was performed, and patient medications and                            allergies were reviewed. The patient's tolerance of                            previous anesthesia was also reviewed. The risks                            and benefits of the procedure and the sedation                            options and risks were discussed with the patient.                            All questions were answered, and informed consent                            was obtained. Prior Anticoagulants: The patient has                            taken no previous anticoagulant or antiplatelet                            agents. ASA Grade Assessment: II - A patient with                            mild  systemic disease. After reviewing the risks                            and benefits, the patient was deemed in                            satisfactory condition to undergo the procedure.                           - Sedation was administered by an anesthesia                            professional. Deep sedation was attained.                           After obtaining informed  consent, the endoscope was                            passed under direct vision. Throughout the                            procedure, the patient's blood pressure, pulse, and                            oxygen saturations were monitored continuously. The                            [id number]) scope was introduced through                            the mouth, and advanced to the second part of                            duodenum. The upper EUS was accomplished without                            difficulty. The patient tolerated the procedure                            well. Scope In: Scope Out: Findings:      Endosonographic Finding :      Pancreatic parenchymal abnormalities were noted in the entire pancreas.       These consisted of hyperechoic strands and lobularity. Impression:               - Pancreatic parenchymal abnormalities consisting                            of hyperechoic strands and lobularity were noted in                            the entire pancreas.                           - No specimens collected. Moderate Sedation:      N/A- Per  Anesthesia Care Recommendation:           - Patient has a contact number available for                            emergencies. The signs and symptoms of potential                            delayed complications were discussed with the                            patient. Return to normal activities tomorrow.                            Written discharge instructions were provided to the                            patient.                           - Resume previous diet.                           - Continue present medications. Procedure Code(s):        --- Professional ---                           [phone], Esophagogastroduodenoscopy, flexible,                            transoral; with endoscopic ultrasound examination                            limited to the esophagus, stomach or duodenum, and                             adjacent structures Diagnosis Code(s):        --- Professional ---                           K86.9, Disease of pancreas, unspecified                           R10.84, Generalized abdominal pain CPT copyright 2016 American Medical Association. All rights reserved. The codes documented in this report are preliminary and upon coder review may  be revised to meet current compliance requirements. Carol Ada, MD Carol Ada, MD 10/08/2015 11:19:49 AM This report has been signed electronically. Number of Addenda: 0

## 2015-10-12 ENCOUNTER — Other Ambulatory Visit: Payer: Self-pay | Admitting: *Deleted

## 2015-10-13 MED ORDER — FLUTICASONE PROPIONATE 50 MCG/ACT NA SUSP
2.0000 | Freq: Every day | NASAL | 3 refills | Status: DC | PRN
Start: 2015-10-13 — End: 2017-01-03

## 2015-10-14 ENCOUNTER — Other Ambulatory Visit: Payer: Self-pay | Admitting: *Deleted

## 2015-10-14 MED ORDER — SYMBICORT 80-4.5 MCG/ACT IN AERO: 2.0000 | INHALATION_SPRAY | Freq: Two times a day (BID) | RESPIRATORY_TRACT | 7 refills | Status: DC

## 2015-11-05 ENCOUNTER — Other Ambulatory Visit: Payer: Self-pay | Admitting: *Deleted

## 2015-11-05 MED ORDER — AMITRIPTYLINE HCL 25 MG PO TABS: ORAL_TABLET | ORAL | 3 refills | Status: DC

## 2015-11-11 ENCOUNTER — Ambulatory Visit (INDEPENDENT_AMBULATORY_CARE_PROVIDER_SITE_OTHER): Payer: Medicaid Other | Admitting: Family Medicine

## 2015-11-11 ENCOUNTER — Other Ambulatory Visit (HOSPITAL_COMMUNITY)
Admission: RE | Admit: 2015-11-11 | Discharge: 2015-11-11 | Disposition: A | Discharge status: Home / Self Care | Payer: Medicaid Other | Source: Ambulatory Visit | Attending: Family Medicine | Admitting: Family Medicine

## 2015-11-11 ENCOUNTER — Encounter: Payer: Self-pay | Admitting: Family Medicine

## 2015-11-11 VITALS — BP 136/84 | HR 87 | Temp 98.0°F | Wt 257.0 lb

## 2015-11-11 DIAGNOSIS — Z113 Encounter for screening for infections with a predominantly sexual mode of transmission: Secondary | ICD-10-CM | POA: Diagnosis not present

## 2015-11-11 DIAGNOSIS — B9689 Other specified bacterial agents as the cause of diseases classified elsewhere: Secondary | ICD-10-CM | POA: Diagnosis not present

## 2015-11-11 DIAGNOSIS — N760 Acute vaginitis: Secondary | ICD-10-CM | POA: Diagnosis not present

## 2015-11-11 DIAGNOSIS — N898 Other specified noninflammatory disorders of vagina: Secondary | ICD-10-CM | POA: Diagnosis present

## 2015-11-11 LAB — POCT URINE PREGNANCY: PREG TEST UR: NEGATIVE

## 2015-11-11 LAB — POCT WET PREP (WET MOUNT)
Clue Cells Wet Prep Whiff POC: POSITIVE
Trichomonas Wet Prep HPF POC: ABSENT

## 2015-11-11 MED ORDER — METRONIDAZOLE 500 MG PO TABS: 500.0000 mg | ORAL_TABLET | Freq: Two times a day (BID) | ORAL | 0 refills | Status: DC

## 2015-11-11 NOTE — Progress Notes (Signed)
VAGINAL DISCHARGE  Having vaginal discharge for 14 days. Discharge is: mucus like and malodorous Sex in last month: no (2 months) Using barrier protection (condoms): no Possible STD exposure: yes Personal history of vaginal infection: yes, >52yrs ago Family history of uterine or vaginal cancer: no Recent antibiotic use: no  Symptoms Vaginal itching: yes Dysuria:no Dyspareunia: no Genital sores or ulcers:no Hematuria: no Flank pain: no Weight loss: no Weight gain: no Trouble with vision: no Headaches: no Abdomen or pelvic pain: no Back pain: no   ROS see HPI Smoking Status noted  CC, SH/smoking status, and VS noted  Objective: BP 136/84   Pulse 87   Temp 98 F (36.7 C) (Oral)   Wt 257 lb (116.6 kg)   BMI 42.77 kg/m  Gen: NAD, alert, cooperative. CV: Well-perfused. Resp: Non-labored. Neuro: Sensation intact throughout. Female genitalia: normal external genitalia, vulva, vagina, cervix, uterus and adnexa Cervix: normal appearing cervix without discharge or lesions, cervical discharge present - malodorous and mucoid and cervical motion tenderness absent   Assessment and plan:  BV (bacterial vaginosis) Patient is here with complaints of increased vaginal discharge and odor. Etiology most likely she vaginosis. Wet prep today supports this diagnosis. - Metronidazole twice a day 7 days - Provided information on vaginal hygiene.   Orders Placed This Encounter  Procedures  . POCT Wet Prep [employer])  . POCT urine pregnancy    Meds ordered this encounter  Medications  . metroNIDAZOLE (FLAGYL) 500 MG tablet    Sig: Take 1 tablet (500 mg total) by mouth 2 (two) times daily.    Dispense:  14 tablet    Refill:  0     Elberta Leatherwood, MD,MS,  PGY3 11/11/2015 2:36 PM

## 2015-11-11 NOTE — Assessment & Plan Note (Signed)
Patient is here with complaints of increased vaginal discharge and odor. Etiology most likely she vaginosis. Wet prep today supports this diagnosis. - Metronidazole twice a day 7 days - Provided information on vaginal hygiene.

## 2015-11-11 NOTE — Patient Instructions (Signed)
It was a pleasure seeing you today in our clinic. Today we discussed your vaginal discharge. Here is the treatment plan we have discussed and agreed upon together:   - Tests today revealed that you likely have bacterial vaginosis. - I've prescribed you metronidazole. Take 1 tablet twice a day for the next 7 days. Do not drink alcohol while on this medication as it may cause significant nausea and vomiting. - If you have any questions call your PCP.   Healthy vaginal hygiene practices   -  Avoid sleeper pajamas. Nightgowns allow air to circulate.  Sleep without underpants whenever possible.  -  Wear cotton underpants during the day. Double-rinse underwear after washing to avoid residual detergent. Do not use fabric softeners for underwear and swimsuits.  - Avoid tights, leotards, leggings, "skinny" jeans, and other tight-fitting clothing. Skirts and loose-fitting pants allow air to circulate.  - Avoid pantyliners.  Instead use tampons or cotton pads.  - Daily warm bathing is helpful:     - Soak in clean water (no soap) for 10 to 15 minutes. Adding vinegar or baking soda to the water has not been specifically studied and may not be better than clean water alone.      - Use soap to wash regions other than the genital area just before getting out of the tub. Limit use of any soap on genital areas. Use fragance-free soaps.     - Rinse the genital area well and gently pat dry.  Don't rub.  Hair dryer to assist with drying can be used only if on cool setting.     - Do not use bubble baths or perfumed soaps.  - Do not use any feminine sprays, douches or powders.  These contain chemicals that will irritate the skin.  - If the genital area is tender or swollen, cool compresses may relieve the discomfort. Unscented wet wipes can be used instead of toilet paper for wiping.   - Emollients, such as Vaseline, may help protect skin and can be applied to the irritated area.  - Always remember to wipe  front-to-back after bowel movements. Pat dry after urination.  - Do not sit in wet swimsuits for long periods of time after swimming

## 2015-11-12 LAB — CERVICOVAGINAL ANCILLARY ONLY
Chlamydia: NEGATIVE
Neisseria Gonorrhea: NEGATIVE

## 2015-11-14 ENCOUNTER — Emergency Department (HOSPITAL_COMMUNITY): Payer: Medicaid Other

## 2015-11-14 ENCOUNTER — Encounter (HOSPITAL_COMMUNITY): Payer: Self-pay | Admitting: Emergency Medicine

## 2015-11-14 ENCOUNTER — Emergency Department (HOSPITAL_COMMUNITY)
Admission: EM | Admit: 2015-11-14 | Discharge: 2015-11-14 | Disposition: A | Discharge status: Home / Self Care | Payer: Medicaid Other | Attending: Emergency Medicine | Admitting: Emergency Medicine

## 2015-11-14 DIAGNOSIS — F17200 Nicotine dependence, unspecified, uncomplicated: Secondary | ICD-10-CM | POA: Insufficient documentation

## 2015-11-14 DIAGNOSIS — I10 Essential (primary) hypertension: Secondary | ICD-10-CM | POA: Insufficient documentation

## 2015-11-14 DIAGNOSIS — R1011 Right upper quadrant pain: Secondary | ICD-10-CM | POA: Diagnosis present

## 2015-11-14 DIAGNOSIS — K861 Other chronic pancreatitis: Secondary | ICD-10-CM | POA: Insufficient documentation

## 2015-11-14 DIAGNOSIS — K859 Acute pancreatitis without necrosis or infection, unspecified: Secondary | ICD-10-CM

## 2015-11-14 DIAGNOSIS — J45909 Unspecified asthma, uncomplicated: Secondary | ICD-10-CM | POA: Insufficient documentation

## 2015-11-14 LAB — I-STAT CG4 LACTIC ACID, ED: LACTIC ACID, VENOUS: 3.21 mmol/L — AB (ref 0.5–1.9)

## 2015-11-14 LAB — COMPREHENSIVE METABOLIC PANEL
ALK PHOS: 92 U/L (ref 38–126)
ALT: 25 U/L (ref 14–54)
AST: 25 U/L (ref 15–41)
Albumin: 4 g/dL (ref 3.5–5.0)
Anion gap: 11 (ref 5–15)
BILIRUBIN TOTAL: 0.6 mg/dL (ref 0.3–1.2)
BUN: 5 mg/dL — AB (ref 6–20)
CALCIUM: 9.9 mg/dL (ref 8.9–10.3)
CO2: 23 mmol/L (ref 22–32)
CREATININE: 0.82 mg/dL (ref 0.44–1.00)
Chloride: 102 mmol/L (ref 101–111)
GFR calc Af Amer: >60 mL/min (ref >60)
GLUCOSE: 161 mg/dL — AB (ref 65–99)
POTASSIUM: 3.7 mmol/L (ref 3.5–5.1)
Sodium: 136 mmol/L (ref 135–145)
TOTAL PROTEIN: 8 g/dL (ref 6.5–8.1)

## 2015-11-14 LAB — CBC
HEMATOCRIT: 39.4 % (ref 36.0–46.0)
Hemoglobin: 13.5 g/dL (ref 12.0–15.0)
MCH: 27.6 pg (ref 26.0–34.0)
MCHC: 34.3 g/dL (ref 30.0–36.0)
MCV: 80.4 fL (ref 78.0–100.0)
PLATELETS: 300 10*3/uL (ref 150–400)
RBC: 4.9 MIL/uL (ref 3.87–5.11)
RDW: 14.4 % (ref 11.5–15.5)
WBC: 10.1 10*3/uL (ref 4.0–10.5)

## 2015-11-14 LAB — LIPASE, BLOOD: Lipase: 184 U/L — ABNORMAL HIGH (ref 11–51)

## 2015-11-14 MED ORDER — HYDROCODONE-ACETAMINOPHEN 5-325 MG PO TABS: 1.0000 | ORAL_TABLET | ORAL | 0 refills | Status: DC | PRN

## 2015-11-14 MED ORDER — ONDANSETRON 4 MG PO TBDP: 4.0000 mg | ORAL_TABLET | Freq: Three times a day (TID) | ORAL | 0 refills | Status: DC | PRN

## 2015-11-14 MED ORDER — METOCLOPRAMIDE HCL 5 MG/ML IJ SOLN
10.0000 mg | Freq: Once | INTRAMUSCULAR | Status: AC
  Administered 2015-11-14: 10 mg via INTRAVENOUS
  Filled 2015-11-14: qty 2

## 2015-11-14 MED ORDER — SODIUM CHLORIDE 0.9 % IV BOLUS (SEPSIS)
1000.0000 mL | Freq: Once | INTRAVENOUS | Status: AC
  Administered 2015-11-14: 1000 mL via INTRAVENOUS

## 2015-11-14 MED ORDER — HYDROMORPHONE HCL 2 MG/ML IJ SOLN
1.0000 mg | Freq: Once | INTRAMUSCULAR | Status: AC
  Administered 2015-11-14: 1 mg via INTRAVENOUS
  Filled 2015-11-14: qty 1

## 2015-11-14 NOTE — ED Triage Notes (Signed)
Pt was seen by PMD 3 days ago for vaginal discharge and dc'd with antibiotic RX.  Patient states she has been taking prescribed medications, but that since yesterday has been unable to keep them or any food or liquid down due to nausea and vomiting.

## 2015-11-14 NOTE — ED Notes (Signed)
Doctor at bedside.
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
## 2015-11-14 NOTE — ED Provider Notes (Signed)
Complains of right-sided abdominal pain onset last night coming by nausea and vomiting pain feels like pancreatitis she's had in the past. She is also been told that she has gallstones. On exam appears uncomfortable, nontoxic. Abdomen morbidly obese, tender at right upper and right lower quadrants no guarding rigidity or rebound, negative Murphy sign   Orlie Dakin, MD 11/14/15 [phone]

## 2015-11-14 NOTE — ED Notes (Signed)
EDP at bedside  

## 2015-11-14 NOTE — ED Notes (Signed)
Patient still in   Ultra sound

## 2015-11-14 NOTE — ED Notes (Signed)
Patient transported to Ultrasound 

## 2015-11-14 NOTE — ED Triage Notes (Addendum)
Pt c/o LRQ abdominal pain since 10pm last night, along with nausea and vomiting.  EMS gave 4mg  IV zofran en route.  Pt was seen in this ED with pancreatitis but dc'd home to followup with GI.  EMS Vitals152/106, hr 80, NSR, CBG 156, resp 16, 98% RA.  Pain 10/10.  18G IV inserted by EMS, patent and flushed by ED staff.

## 2015-11-16 ENCOUNTER — Observation Stay (HOSPITAL_COMMUNITY): Payer: Medicaid Other

## 2015-11-16 ENCOUNTER — Encounter (HOSPITAL_COMMUNITY): Payer: Self-pay | Admitting: Emergency Medicine

## 2015-11-16 ENCOUNTER — Other Ambulatory Visit: Payer: Self-pay

## 2015-11-16 ENCOUNTER — Emergency Department (HOSPITAL_COMMUNITY): Payer: Medicaid Other

## 2015-11-16 ENCOUNTER — Inpatient Hospital Stay (HOSPITAL_COMMUNITY)
Admission: EM | Admit: 2015-11-16 | Discharge: 2015-11-19 | DRG: 439 | Disposition: A | Discharge status: Home / Self Care | Payer: Medicaid Other | Attending: Family Medicine | Admitting: Family Medicine

## 2015-11-16 DIAGNOSIS — J45909 Unspecified asthma, uncomplicated: Secondary | ICD-10-CM | POA: Diagnosis present

## 2015-11-16 DIAGNOSIS — K5900 Constipation, unspecified: Secondary | ICD-10-CM | POA: Diagnosis present

## 2015-11-16 DIAGNOSIS — K861 Other chronic pancreatitis: Secondary | ICD-10-CM | POA: Diagnosis present

## 2015-11-16 DIAGNOSIS — M797 Fibromyalgia: Secondary | ICD-10-CM | POA: Diagnosis present

## 2015-11-16 DIAGNOSIS — Z7951 Long term (current) use of inhaled steroids: Secondary | ICD-10-CM

## 2015-11-16 DIAGNOSIS — R112 Nausea with vomiting, unspecified: Secondary | ICD-10-CM | POA: Diagnosis present

## 2015-11-16 DIAGNOSIS — E876 Hypokalemia: Secondary | ICD-10-CM | POA: Diagnosis present

## 2015-11-16 DIAGNOSIS — F319 Bipolar disorder, unspecified: Secondary | ICD-10-CM | POA: Diagnosis present

## 2015-11-16 DIAGNOSIS — R109 Unspecified abdominal pain: Secondary | ICD-10-CM

## 2015-11-16 DIAGNOSIS — F259 Schizoaffective disorder, unspecified: Secondary | ICD-10-CM | POA: Diagnosis present

## 2015-11-16 DIAGNOSIS — F17210 Nicotine dependence, cigarettes, uncomplicated: Secondary | ICD-10-CM | POA: Diagnosis present

## 2015-11-16 DIAGNOSIS — E7800 Pure hypercholesterolemia, unspecified: Secondary | ICD-10-CM | POA: Diagnosis present

## 2015-11-16 DIAGNOSIS — E871 Hypo-osmolality and hyponatremia: Secondary | ICD-10-CM | POA: Diagnosis not present

## 2015-11-16 DIAGNOSIS — R7303 Prediabetes: Secondary | ICD-10-CM | POA: Diagnosis present

## 2015-11-16 DIAGNOSIS — I10 Essential (primary) hypertension: Secondary | ICD-10-CM | POA: Diagnosis present

## 2015-11-16 DIAGNOSIS — M545 Low back pain: Secondary | ICD-10-CM | POA: Diagnosis present

## 2015-11-16 DIAGNOSIS — Z6841 Body Mass Index (BMI) 40.0 and over, adult: Secondary | ICD-10-CM

## 2015-11-16 DIAGNOSIS — K859 Acute pancreatitis without necrosis or infection, unspecified: Principal | ICD-10-CM | POA: Diagnosis present

## 2015-11-16 DIAGNOSIS — J9811 Atelectasis: Secondary | ICD-10-CM | POA: Diagnosis present

## 2015-11-16 DIAGNOSIS — H5462 Unqualified visual loss, left eye, normal vision right eye: Secondary | ICD-10-CM | POA: Diagnosis present

## 2015-11-16 DIAGNOSIS — G8929 Other chronic pain: Secondary | ICD-10-CM | POA: Diagnosis present

## 2015-11-16 DIAGNOSIS — R1011 Right upper quadrant pain: Secondary | ICD-10-CM

## 2015-11-16 DIAGNOSIS — K219 Gastro-esophageal reflux disease without esophagitis: Secondary | ICD-10-CM | POA: Diagnosis present

## 2015-11-16 LAB — URINALYSIS, ROUTINE W REFLEX MICROSCOPIC
BILIRUBIN URINE: NEGATIVE
GLUCOSE, UA: NEGATIVE mg/dL
Hgb urine dipstick: NEGATIVE
KETONES UR: NEGATIVE mg/dL
LEUKOCYTES UA: NEGATIVE
NITRITE: NEGATIVE
PH: 7.5 (ref 5.0–8.0)
Protein, ur: NEGATIVE mg/dL
SPECIFIC GRAVITY, URINE: 1.028 (ref 1.005–1.030)

## 2015-11-16 LAB — CBC
HCT: 38.2 % (ref 36.0–46.0)
HEMATOCRIT: 39.4 % (ref 36.0–46.0)
HEMOGLOBIN: 13.4 g/dL (ref 12.0–15.0)
Hemoglobin: 13.1 g/dL (ref 12.0–15.0)
MCH: 27.6 pg (ref 26.0–34.0)
MCH: 27.9 pg (ref 26.0–34.0)
MCHC: 34 g/dL (ref 30.0–36.0)
MCHC: 34.3 g/dL (ref 30.0–36.0)
MCV: 81.1 fL (ref 78.0–100.0)
MCV: 81.4 fL (ref 78.0–100.0)
PLATELETS: 268 10*3/uL (ref 150–400)
Platelets: 288 10*3/uL (ref 150–400)
RBC: 4.86 MIL/uL (ref 3.87–5.11)
RBC: 4.69 MIL/uL (ref 3.87–5.11)
RDW: 14.6 % (ref 11.5–15.5)
RDW: 15.1 % (ref 11.5–15.5)
WBC: 9.2 10*3/uL (ref 4.0–10.5)
WBC: 9 10*3/uL (ref 4.0–10.5)

## 2015-11-16 LAB — COMPREHENSIVE METABOLIC PANEL
ALK PHOS: 87 U/L (ref 38–126)
ALT: 26 U/L (ref 14–54)
ANION GAP: 14 (ref 5–15)
AST: 30 U/L (ref 15–41)
Albumin: 4.2 g/dL (ref 3.5–5.0)
BILIRUBIN TOTAL: 0.4 mg/dL (ref 0.3–1.2)
BUN: 7 mg/dL (ref 6–20)
CALCIUM: 9.7 mg/dL (ref 8.9–10.3)
CO2: 23 mmol/L (ref 22–32)
CREATININE: 0.87 mg/dL (ref 0.44–1.00)
Chloride: 101 mmol/L (ref 101–111)
Glucose, Bld: 153 mg/dL — ABNORMAL HIGH (ref 65–99)
Potassium: 3.8 mmol/L (ref 3.5–5.1)
Sodium: 138 mmol/L (ref 135–145)
TOTAL PROTEIN: 7.8 g/dL (ref 6.5–8.1)

## 2015-11-16 LAB — CREATININE, SERUM: CREATININE: 0.81 mg/dL (ref 0.44–1.00)

## 2015-11-16 LAB — TROPONIN I: Troponin I: <0.03 ng/mL (ref <0.03)

## 2015-11-16 LAB — I-STAT CG4 LACTIC ACID, ED: LACTIC ACID, VENOUS: 0.96 mmol/L (ref 0.5–1.9)

## 2015-11-16 LAB — TSH: TSH: 0.254 u[IU]/mL — ABNORMAL LOW (ref 0.350–4.500)

## 2015-11-16 LAB — LIPASE, BLOOD: Lipase: 100 U/L — ABNORMAL HIGH (ref 11–51)

## 2015-11-16 LAB — RAPID URINE DRUG SCREEN, HOSP PERFORMED
AMPHETAMINES: NOT DETECTED
BARBITURATES: NOT DETECTED
BENZODIAZEPINES: NOT DETECTED
Cocaine: NOT DETECTED
Opiates: NOT DETECTED
TETRAHYDROCANNABINOL: NOT DETECTED

## 2015-11-16 LAB — ETHANOL: Alcohol, Ethyl (B): <5 mg/dL (ref <5)

## 2015-11-16 MED ORDER — ALBUTEROL SULFATE HFA 108 (90 BASE) MCG/ACT IN AERS: 2.0000 | INHALATION_SPRAY | Freq: Four times a day (QID) | RESPIRATORY_TRACT | Status: DC | PRN

## 2015-11-16 MED ORDER — ONDANSETRON HCL 4 MG/2ML IJ SOLN: 4.0000 mg | Freq: Three times a day (TID) | INTRAMUSCULAR | Status: DC | PRN

## 2015-11-16 MED ORDER — METOCLOPRAMIDE HCL 10 MG PO TABS
10.0000 mg | ORAL_TABLET | Freq: Three times a day (TID) | ORAL | Status: DC | PRN
  Administered 2015-11-18: 10 mg via ORAL
  Filled 2015-11-16: qty 1

## 2015-11-16 MED ORDER — SODIUM CHLORIDE 0.9 % IV SOLN
INTRAVENOUS | Status: DC
  Administered 2015-11-16 – 2015-11-18 (×9): via INTRAVENOUS

## 2015-11-16 MED ORDER — FENTANYL CITRATE (PF) 100 MCG/2ML IJ SOLN
25.0000 ug | INTRAMUSCULAR | Status: DC | PRN
  Administered 2015-11-16 (×2): 25 ug via INTRAVENOUS
  Filled 2015-11-16 (×2): qty 2

## 2015-11-16 MED ORDER — SODIUM CHLORIDE 0.9 % IV BOLUS (SEPSIS)
1000.0000 mL | Freq: Once | INTRAVENOUS | Status: AC
  Administered 2015-11-16: 1000 mL via INTRAVENOUS

## 2015-11-16 MED ORDER — FENTANYL CITRATE (PF) 100 MCG/2ML IJ SOLN
25.0000 ug | INTRAMUSCULAR | Status: DC | PRN
  Administered 2015-11-16 – 2015-11-17 (×6): 25 ug via INTRAVENOUS
  Filled 2015-11-16 (×6): qty 2

## 2015-11-16 MED ORDER — MOMETASONE FURO-FORMOTEROL FUM 100-5 MCG/ACT IN AERO
2.0000 | INHALATION_SPRAY | Freq: Two times a day (BID) | RESPIRATORY_TRACT | Status: DC
  Administered 2015-11-17: 2 via RESPIRATORY_TRACT
  Filled 2015-11-16: qty 8.8

## 2015-11-16 MED ORDER — HYDROCODONE-ACETAMINOPHEN 5-325 MG PO TABS: 1.0000 | ORAL_TABLET | ORAL | Status: DC | PRN

## 2015-11-16 MED ORDER — ONDANSETRON HCL 4 MG/2ML IJ SOLN
4.0000 mg | Freq: Four times a day (QID) | INTRAMUSCULAR | Status: DC | PRN
  Administered 2015-11-16 – 2015-11-19 (×8): 4 mg via INTRAVENOUS
  Filled 2015-11-16 (×9): qty 2

## 2015-11-16 MED ORDER — POLYETHYLENE GLYCOL 3350 17 G PO PACK: 17.0000 g | PACK | Freq: Two times a day (BID) | ORAL | Status: DC

## 2015-11-16 MED ORDER — ONDANSETRON HCL 4 MG/2ML IJ SOLN
4.0000 mg | Freq: Once | INTRAMUSCULAR | Status: AC | PRN
  Administered 2015-11-16: 4 mg via INTRAVENOUS
  Filled 2015-11-16: qty 2

## 2015-11-16 MED ORDER — HYDROMORPHONE HCL 2 MG/ML IJ SOLN
1.0000 mg | Freq: Once | INTRAMUSCULAR | Status: AC
  Administered 2015-11-16: 1 mg via INTRAVENOUS
  Filled 2015-11-16: qty 1

## 2015-11-16 MED ORDER — FLEET ENEMA 7-19 GM/118ML RE ENEM
1.0000 | ENEMA | Freq: Once | RECTAL | Status: AC
  Administered 2015-11-16: 1 via RECTAL
  Filled 2015-11-16: qty 1

## 2015-11-16 MED ORDER — ENOXAPARIN SODIUM 40 MG/0.4ML ~~LOC~~ SOLN
40.0000 mg | SUBCUTANEOUS | Status: DC
  Administered 2015-11-17 – 2015-11-18 (×2): 40 mg via SUBCUTANEOUS
  Filled 2015-11-16 (×3): qty 0.4

## 2015-11-16 MED ORDER — ONDANSETRON HCL 4 MG PO TABS
4.0000 mg | ORAL_TABLET | Freq: Four times a day (QID) | ORAL | Status: DC | PRN
  Administered 2015-11-18: 4 mg via ORAL
  Filled 2015-11-16: qty 1

## 2015-11-16 MED ORDER — FAMOTIDINE IN NACL 20-0.9 MG/50ML-% IV SOLN
20.0000 mg | Freq: Two times a day (BID) | INTRAVENOUS | Status: DC
  Administered 2015-11-17 – 2015-11-19 (×6): 20 mg via INTRAVENOUS
  Filled 2015-11-16 (×7): qty 50

## 2015-11-16 MED ORDER — ALBUTEROL SULFATE (2.5 MG/3ML) 0.083% IN NEBU: 2.5000 mg | INHALATION_SOLUTION | Freq: Four times a day (QID) | RESPIRATORY_TRACT | Status: DC | PRN

## 2015-11-16 MED ORDER — IOPAMIDOL (ISOVUE-300) INJECTION 61%
INTRAVENOUS | Status: AC
  Administered 2015-11-16: 100 mL
  Filled 2015-11-16: qty 100

## 2015-11-16 NOTE — Progress Notes (Signed)
FPTS Interim Progress Note  S: Paged to intern pager patient received enema, had no bowel movement but was complaining of excruciating abdominal pain.  Patient was seen and examined at bedside. She reports diffuse abdominal pain, which has been worsening since onset. She has been unable to have a bowel movement in several days. Endorses that she "feels like she's in labor" with cramping pain, however worse in epigastric region.  Patient reports that pain is consistent with previous pancreatitis symptoms.  O: BP 144/90 (BP Location: Right Arm)   Pulse 94   Temp 98.2 F (36.8 C) (Oral)   Resp 20   LMP 10/14/2015 (Approximate)   SpO2 96%   Gen: Patient in apparent distress, diaphoretic and calling out in pain, unable to sit still, holding her abdomen Abd: abdomen is soft, (+)normoactive bowel sounds appreciated, (+)RUQ/epigastric tenderness to palpation without rebound or guarding (-) murphy sign, (-) Rosving sign, rectal exam WNL.   A/P: Patient is a 47yoF with abdominal pain worsening over the past two days, found to have elevated lipase and symptoms are reportedly consistent with previous pancreatitis symptoms.  Abdominal Pain - likely due to pancreatitis - Will treat for pancreatitis with IVF and pain control with fentanyl 25 mcg q3h PRN and norco-vicoden 5-325mg  q4h, keep NPO.  Will monitor abdominal pain.   1. CT abdomen had been performed earlier without abnormal findings on the read. The radiologist was called to look over images again, uterus, appendix and all visualized organs were stable from previous CT. No radiologic evidence of pancreatitis. 2.  Lipase noted to be elevated at 100, previously high at 184 in ED two days ago. 3.  Bladder full on CT but not abnormal. Bladder scan at bedside 12 mL residual. 4.  bHcg, UDS added to labs  Constipation 1. continue miralax BID 2. Will monitor  Everrett Coombe, MD 11/16/2015, 2:08 PM PGY-1, [facility] Service pager  [phone]

## 2015-11-16 NOTE — ED Notes (Signed)
Patient transported to CT 
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
## 2015-11-16 NOTE — ED Notes (Signed)
Report called to [employer]

## 2015-11-16 NOTE — Discharge Summary (Signed)
[facility] Discharge Summary  Patient name: Holly Mendez Medical record number: [id number] Date of birth: 23-Dec-1975 Age: 40 y.o. Gender: female Date of Admission: 11/16/2015  Date of Discharge: 11/19/15 Admitting Physician: Leeanne Rio, MD  Primary Care Provider: [name], MD Consultants: None  Indication for Hospitalization: Acute pancretitis  Discharge Diagnoses/Problem List:  Resolving uncomplicated acute pancreatitis Constipation Hyperlipidemia Asthma Prediabetes Schitzoaffective Disorder/Bipolar Disorder  Disposition: Home  Discharge Condition: Stable, imporved  Discharge Exam:  General: obese, well nourished, well developed, no acute distress with non-toxic appearance HEENT: normocephalic, atraumatic, moist mucous membranes CV: regular rate and rhythm without murmurs, rubs, or gallops Lungs: clear to auscultation bilaterally with normal work of breathing Abdomen: soft, non-distended, non-tenderness w/o rebound or guarding, no masses or organomegaly palpable, normoactive bowel sounds Skin: warm, dry, no rashes or lesions, cap refill < 2 seconds Extremities: warm and well perfused, normal tone  Brief Hospital Course:  Holly Mendez is a 40 y.o. female presenting with acute epigastric/RUQ/RLQ abdominal pain. PMH is significant for recurrent pancreatitis, constipation, morbid obesity, hyperlipidemia, hypertension, GERD, schizoaffective.  Onset 11/06 and worsening with eating and drinking leading to non-bloody and non-bilious vomiting. Patient noted not having a bowel movement in 1 week. Lipase was 100 on admission, improved from lipase 2 days prior for same complaint. Patient was treated as resolving uncomplicated acute pancreatitis. Patient was able to pass normal stool during admission and diet was advanced as tolerated. Patients vitals remained stable and abdominal pain was minimal with resolution of nausea on  discharge with close f/u with PCP.  Issues for Follow Up:  1. Patient was treated for resolving uncomplicated pancreatitis given symptoms and improved lipase on admission given previous lipase 2 days prior 2. Patient was found to have criteria for prediabetes and needs counseling on PCP f/u 3. Patient needs CBC and CMET on f/u to check mild hyponatremia, liver function status, and mild anemia 4. Dr. Benson Norway, patients GI, was contacted and aware of hospitalization 5. Patient needs further GI investigation given finding of EGD U/S on 10/08/15 6. Patients amitriptyline, reglan, tizanidine, and phenergan was stopped during hospitalization and on d/c  Significant Procedures: None  Significant Labs and Imaging:   Recent Labs Lab 11/16/15 2343 11/18/15 0409 11/19/15 0539  WBC 10.6* 8.9 8.8  HGB 13.1 13.0 11.4*  HCT 37.6 38.5 35.1*  PLT 271 301 171    Recent Labs Lab 11/16/15 0544 11/16/15 1532 11/16/15 2343 11/18/15 0409 11/19/15 0539 11/19/15 1319  NA 138  --  134* 136 131* 133*  K 3.8  --  4.2 3.2* 5.2* 3.8  CL 101  --  98* 96* 96* 94*  CO2 23  --  25 29 23 28   GLUCOSE 153*  --  136* 151* 117* 127*  BUN 7  --  <5* <5* <5* 5*  CREATININE 0.87 0.81 0.71 0.68 0.72 0.82  CALCIUM 9.7  --  8.9 9.1 9.3 10.0  ALKPHOS 87  --  78 75  --   --   AST 30  --  20 22  --   --   ALT 26  --  22 23  --   --   ALBUMIN 4.2  --  3.7 3.8  --   --    Troponin: neg x3 B-hCG: neg TSH: 0.254 LDH: 0.96 UDS: neg UA: neg Lipase: 100 (184 11/05)  CT ABDOMEN PELVIS W CONTRAST (11/17/15) FINDINGS: Lower chest: Mild basilar atelectasis. Hepatobiliary: No focal liver abnormality.No evidence  of biliary obstruction or stone. Pancreas: Unremarkable. Spleen: Unremarkable. Adrenals/Urinary Tract: Negative adrenals. No hydronephrosis or stone. Full urinary bladder. No wall thickening. Stomach/Bowel: No obstruction. No appendicitis. Vascular/Lymphatic: No acute vascular abnormality. No mass or  adenopathy. Reproductive: Fluid again noted in the posterior vaginal fornix. There may also be nabothian cysts. Other: No ascites or pneumoperitoneum. Musculoskeletal: No acute or aggressive finding  IMPRESSION: No explanation for acute abdominal pain.  [name] RUQ (11/17/15) FINDINGS: Gallbladder: No gallstones or wall thickening visualized. No sonographic Murphy sign noted by sonographer. Common bile duct: Diameter: 7.9 mm tapering to a normal caliber of the pancreatic head. Liver: No focal lesion identified. Within normal limits in parenchymal echogenicity.  IMPRESSION: No cholelithiasis or sonographic evidence of acute cholecystitis.  Results/Tests Pending at Time of Discharge: None  Discharge Medications:    Medication List    STOP taking these medications   amitriptyline 25 MG tablet Commonly known as:  ELAVIL   cetirizine 10 MG tablet Commonly known as:  ZYRTEC   metoCLOPramide 10 MG tablet Commonly known as:  REGLAN   metroNIDAZOLE 500 MG tablet Commonly known as:  FLAGYL   promethazine 25 MG suppository Commonly known as:  PHENERGAN   tizanidine 2 MG capsule Commonly known as:  ZANAFLEX     TAKE these medications   albuterol 108 (90 Base) MCG/ACT inhaler Commonly known as:  PROVENTIL HFA;VENTOLIN HFA Inhale 2 puffs into the lungs every 6 (six) hours as needed for wheezing or shortness of breath.   ARIPiprazole 10 MG tablet Commonly known as:  ABILIFY Take 10 mg by mouth daily.   carbamazepine 200 MG tablet Commonly known as:  TEGRETOL Take 200 mg by mouth 2 (two) times daily.   docusate sodium 100 MG capsule Commonly known as:  COLACE Take 1 capsule (100 mg total) by mouth 2 (two) times daily.   fluticasone 50 MCG/ACT nasal spray Commonly known as:  FLONASE Place 2 sprays into both nostrils daily as needed for allergies or rhinitis.   HYDROcodone-acetaminophen 5-325 MG tablet Commonly known as:  NORCO/VICODIN Take 1-2 tablets by mouth every  6 (six) hours as needed. What changed:  when to take this   ondansetron 4 MG disintegrating tablet Commonly known as:  ZOFRAN ODT Take 1 tablet (4 mg total) by mouth every 8 (eight) hours as needed for nausea or vomiting.   pantoprazole 40 MG tablet Commonly known as:  PROTONIX Take 1 tablet (40 mg total) by mouth 2 (two) times daily.   polyethylene glycol packet Commonly known as:  MIRALAX / GLYCOLAX Take 17 g by mouth daily.   SYMBICORT 80-4.5 MCG/ACT inhaler Generic drug:  budesonide-formoterol Inhale 2 puffs into the lungs 2 (two) times daily.       Discharge Instructions: Please refer to Patient Instructions section of EMR for full details.  Patient was counseled important signs and symptoms that should prompt return to medical care, changes in medications, dietary instructions, activity restrictions, and follow up appointments.   Follow-Up Appointments: Follow-up Information    [name], MD Follow up on 11/24/2015.   Specialties:  Family Medicine, Sports Medicine Why:  hospital follow-up appointment at 10:45AM Contact information: [address] [place] [address] [place], DO 11/21/2015, 3:30 PM PGY-1, [place]

## 2015-11-16 NOTE — ED Notes (Signed)
Attempted report 
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
## 2015-11-16 NOTE — ED Notes (Signed)
Pt heard vomiting from room. Pt found to have vomit on sheet in bed. Linens changed. Pt given emesis bag and informed to vomit in bag

## 2015-11-16 NOTE — ED Triage Notes (Signed)
Per EMS pt reports severe abdominal pain and nausea since noon yesterday.  Seen recently but states "nothing helped".

## 2015-11-17 ENCOUNTER — Observation Stay (HOSPITAL_COMMUNITY): Payer: Medicaid Other

## 2015-11-17 DIAGNOSIS — M797 Fibromyalgia: Secondary | ICD-10-CM | POA: Diagnosis present

## 2015-11-17 DIAGNOSIS — I10 Essential (primary) hypertension: Secondary | ICD-10-CM | POA: Diagnosis present

## 2015-11-17 DIAGNOSIS — F17210 Nicotine dependence, cigarettes, uncomplicated: Secondary | ICD-10-CM | POA: Diagnosis present

## 2015-11-17 DIAGNOSIS — E7800 Pure hypercholesterolemia, unspecified: Secondary | ICD-10-CM | POA: Diagnosis present

## 2015-11-17 DIAGNOSIS — F319 Bipolar disorder, unspecified: Secondary | ICD-10-CM | POA: Diagnosis present

## 2015-11-17 DIAGNOSIS — E871 Hypo-osmolality and hyponatremia: Secondary | ICD-10-CM | POA: Diagnosis not present

## 2015-11-17 DIAGNOSIS — K859 Acute pancreatitis without necrosis or infection, unspecified: Secondary | ICD-10-CM | POA: Diagnosis present

## 2015-11-17 DIAGNOSIS — K219 Gastro-esophageal reflux disease without esophagitis: Secondary | ICD-10-CM | POA: Diagnosis present

## 2015-11-17 DIAGNOSIS — K861 Other chronic pancreatitis: Secondary | ICD-10-CM | POA: Diagnosis present

## 2015-11-17 DIAGNOSIS — R1011 Right upper quadrant pain: Secondary | ICD-10-CM

## 2015-11-17 DIAGNOSIS — Z6841 Body Mass Index (BMI) 40.0 and over, adult: Secondary | ICD-10-CM | POA: Diagnosis not present

## 2015-11-17 DIAGNOSIS — R112 Nausea with vomiting, unspecified: Secondary | ICD-10-CM

## 2015-11-17 DIAGNOSIS — E876 Hypokalemia: Secondary | ICD-10-CM | POA: Diagnosis present

## 2015-11-17 DIAGNOSIS — R7303 Prediabetes: Secondary | ICD-10-CM | POA: Diagnosis present

## 2015-11-17 DIAGNOSIS — F259 Schizoaffective disorder, unspecified: Secondary | ICD-10-CM | POA: Diagnosis present

## 2015-11-17 DIAGNOSIS — R109 Unspecified abdominal pain: Secondary | ICD-10-CM | POA: Diagnosis present

## 2015-11-17 DIAGNOSIS — H5462 Unqualified visual loss, left eye, normal vision right eye: Secondary | ICD-10-CM | POA: Diagnosis present

## 2015-11-17 DIAGNOSIS — J9811 Atelectasis: Secondary | ICD-10-CM | POA: Diagnosis present

## 2015-11-17 DIAGNOSIS — R1013 Epigastric pain: Secondary | ICD-10-CM | POA: Diagnosis not present

## 2015-11-17 DIAGNOSIS — K5900 Constipation, unspecified: Secondary | ICD-10-CM | POA: Diagnosis present

## 2015-11-17 DIAGNOSIS — J45909 Unspecified asthma, uncomplicated: Secondary | ICD-10-CM | POA: Diagnosis present

## 2015-11-17 DIAGNOSIS — R1084 Generalized abdominal pain: Secondary | ICD-10-CM | POA: Diagnosis not present

## 2015-11-17 DIAGNOSIS — Z7951 Long term (current) use of inhaled steroids: Secondary | ICD-10-CM | POA: Diagnosis not present

## 2015-11-17 DIAGNOSIS — M545 Low back pain: Secondary | ICD-10-CM | POA: Diagnosis present

## 2015-11-17 DIAGNOSIS — G8929 Other chronic pain: Secondary | ICD-10-CM | POA: Diagnosis present

## 2015-11-17 LAB — COMPREHENSIVE METABOLIC PANEL
ALK PHOS: 78 U/L (ref 38–126)
ALT: 22 U/L (ref 14–54)
ANION GAP: 11 (ref 5–15)
AST: 20 U/L (ref 15–41)
Albumin: 3.7 g/dL (ref 3.5–5.0)
BUN: <5 mg/dL — ABNORMAL LOW (ref 6–20)
CALCIUM: 8.9 mg/dL (ref 8.9–10.3)
CHLORIDE: 98 mmol/L — AB (ref 101–111)
CO2: 25 mmol/L (ref 22–32)
Creatinine, Ser: 0.71 mg/dL (ref 0.44–1.00)
Glucose, Bld: 136 mg/dL — ABNORMAL HIGH (ref 65–99)
Potassium: 4.2 mmol/L (ref 3.5–5.1)
SODIUM: 134 mmol/L — AB (ref 135–145)
Total Bilirubin: 1 mg/dL (ref 0.3–1.2)
Total Protein: 7 g/dL (ref 6.5–8.1)

## 2015-11-17 LAB — CBC
HCT: 37.6 % (ref 36.0–46.0)
HEMOGLOBIN: 13.1 g/dL (ref 12.0–15.0)
MCH: 28.2 pg (ref 26.0–34.0)
MCHC: 34.8 g/dL (ref 30.0–36.0)
MCV: 80.9 fL (ref 78.0–100.0)
PLATELETS: 271 10*3/uL (ref 150–400)
RBC: 4.65 MIL/uL (ref 3.87–5.11)
RDW: 15 % (ref 11.5–15.5)
WBC: 10.6 10*3/uL — AB (ref 4.0–10.5)

## 2015-11-17 LAB — TROPONIN I: Troponin I: <0.03 ng/mL (ref <0.03)

## 2015-11-17 LAB — BETA HCG QUANT (REF LAB)

## 2015-11-17 MED ORDER — HYDROMORPHONE HCL 2 MG/ML IJ SOLN
1.0000 mg | INTRAMUSCULAR | Status: DC | PRN
  Administered 2015-11-17 – 2015-11-18 (×7): 1 mg via INTRAVENOUS
  Filled 2015-11-17 (×7): qty 1

## 2015-11-17 MED ORDER — PROMETHAZINE HCL 25 MG RE SUPP: 12.5000 mg | Freq: Three times a day (TID) | RECTAL | Status: DC | PRN

## 2015-11-17 MED ORDER — ARIPIPRAZOLE 10 MG PO TABS
10.0000 mg | ORAL_TABLET | Freq: Every day | ORAL | Status: DC
  Administered 2015-11-17 – 2015-11-19 (×3): 10 mg via ORAL
  Filled 2015-11-17 (×4): qty 1

## 2015-11-17 NOTE — Progress Notes (Signed)
Family Medicine Teaching Service Daily Progress Note Intern Pager: [phone]  Patient name: Holly Mendez Medical record number: [id number] Date of birth: 1975/08/22 Age: 40 y.o. Gender: female  Primary Care Provider: [name], MD Consultants: None Code Status: Full  Pt Overview and Major Events to Date:  11/07: Admit for abdominal pain thought to be acute pancreatitis  Assessment and Plan: Holly Mendez is a 40 y.o. female presenting with acute onset abdominal pain. PMH is significant for bipolar disorder, hypertension, morbid obesity, hyperglycemia, chronic back pain, and constipation.  # Acute abdominal pain:  Etiology currently unknown however initial concern for constipation versus pancreatitis. Pancreatitis thought to be relatively unlikely as lipase is elevated but its improving from 10/05, and admission level was only twice the normal range. Constipation seems relatively likely as patient endorses no BMs over the past week and no flatulence. Patient has passed stool 12-03-22 AM, therefore small bowel obstruction is unlikely. CT obtained in the ED does not show any evidence of bowel obstruction but does note some fluid in the posterior vaginal fornix. Differential would include cholelithiasis, however on 11/05, patient underwent an abdominal ultrasound (RUQ) which showed no biliary ductal dilation or gallbladder pathology. Labs include TSH low 0.254, troponin neg x3, lactic acid 0.96. Patient seen by Dr. Benson Norway for GI. Discussed this patient with him over the phone and he did not feel the need to be consulted but felt she may benefit from phenergan suppositories. Has long h/o constipation. He said pancreatitis is unlikely and the CT was not concerning for acute abdomen.  --Initiate liquid diet as toelrated --RUQ U/S pedning --NS IV @200cc /hr --Zofran q8h PRN --Continuing home Reglan 10 mg TID PRN --Phenergan suppositories q6h PRN  --MiraLAX BID --Nursing to track  I&O's --Dilauded q3h PRN pain, Fentanyl and Norco d/c'd --Pepcid 20 mg IV BID --Will add home Abilify 10 mg QD  #Constipation:  Possible overall cause of patient's abdominal pain. Patient noted lack of a BM for at least a week but passed loose stool this AM though RN not aware and not recorded. --MiraLAX BID --Track I&Os --Avoid dehydration  #Elevated Lipase, Acute, Improving:  Initial concern for pancreatitis. Fortunately, patient's lipase level seemed to be reducing back towards normal from 11/05. --Monitor CBC and CMET daily --Escalate diet as tolerated  FEN/GI: Clear liquid diet, IVF NS @200cc /hr, Pepcid 20 mg IV BID Prophylaxis: Lovenox  Disposition: Pending improvement of abdominal pain and tolerating diet  Subjective:  VSS and afebrile. Patient had x2 episodes of emesis, non-bloody or bilious. She had a loose bowel movement this AM. Patient endorses pain which was not improved with fentanyl. States, "I need the pain medication I got in the ED. That seemed to help with the pain."   Objective: Temp:  [98.1 F (36.7 C)-98.8 F (37.1 C)] 98.1 F (36.7 C) Dec 03, 2022 0728) Pulse Rate:  [73-108] 79 12-03-2022 0728) Resp:  [17-24] 18 12-03-2022 0728) BP: (136-155)/(79-90) 136/82 12/03/22 0728) SpO2:  [96 %-100 %] 96 % 12-03-22 0728) Physical Exam: General: obese, well nourished, well developed, acute distress 2/2 abdominal tenderness with non-toxic appearance HEENT: normocephalic, atraumatic, moist mucous membranes CV: regular rate and rhythm without murmurs, rubs, or gallops Lungs: clear to auscultation bilaterally with normal work of breathing Abdomen: soft, non-distended, diffuse tenderness worse on right side near RUQ w/o rebound or guarding, no masses or organomegaly palpable, normoactive bowel sounds Skin: warm, dry, no rashes or lesions, cap refill < 2 seconds Extremities: warm and well perfused, normal tone  Laboratory:  Recent Labs Lab 11/16/15 0544 11/16/15 1532  11/16/15 2343  WBC 9.2 9.0 10.6*  HGB 13.4 13.1 13.1  HCT 39.4 38.2 37.6  PLT 288 268 271    Recent Labs Lab 11/14/15 1100 11/16/15 0544 11/16/15 1532 11/16/15 2343  NA 136 138  --  134*  K 3.7 3.8  --  4.2  CL 102 101  --  98*  CO2 23 23  --  25  BUN 5* 7  --  <5*  CREATININE 0.82 0.87 0.81 0.71  CALCIUM 9.9 9.7  --  8.9  PROT 8.0 7.8  --  7.0  BILITOT 0.6 0.4  --  1.0  ALKPHOS 92 87  --  78  ALT 25 26  --  22  AST 25 30  --  20  GLUCOSE 161* 153*  --  136*   Troponin: neg x3 B-hCG: neg TSH: 0.254 LDH: 0.96 UDS: neg UA: neg Lipase: 100 (184 11/05)  Imaging/Diagnostic Tests: CT ABDOMEN PELVIS W CONTRAST (11/17/15) FINDINGS: Lower chest:  Mild basilar atelectasis. Hepatobiliary: No focal liver abnormality.No evidence of biliary obstruction or stone. Pancreas: Unremarkable. Spleen: Unremarkable. Adrenals/Urinary Tract: Negative adrenals. No hydronephrosis or stone. Full urinary bladder. No wall thickening. Stomach/Bowel:  No obstruction. No appendicitis. Vascular/Lymphatic: No acute vascular abnormality. No mass or adenopathy. Reproductive: Fluid again noted in the posterior vaginal fornix. There may also be nabothian cysts. Other: No ascites or pneumoperitoneum. Musculoskeletal: No acute or aggressive finding  IMPRESSION: No explanation for acute abdominal pain.  Holly Mendez RUQ (11/17/15) FINDINGS: Gallbladder: No gallstones or wall thickening visualized. No sonographic Murphy sign noted by sonographer. Common bile duct: Diameter: 7.9 mm tapering to a normal caliber of the pancreatic head. Liver: No focal lesion identified. Within normal limits in parenchymal echogenicity.  IMPRESSION: No cholelithiasis or sonographic evidence of acute cholecystitis.    Holly Bing, DO 11/17/2015, 8:17 AM PGY-1, [place] Intern pager: [phone], text pages welcome

## 2015-11-18 DIAGNOSIS — R1013 Epigastric pain: Secondary | ICD-10-CM

## 2015-11-18 DIAGNOSIS — R1084 Generalized abdominal pain: Secondary | ICD-10-CM

## 2015-11-18 LAB — LIPID PANEL
CHOL/HDL RATIO: 5.6 ratio
Cholesterol: 228 mg/dL — ABNORMAL HIGH (ref 0–200)
HDL: 41 mg/dL (ref >40)
LDL CALC: 161 mg/dL — AB (ref 0–99)
TRIGLYCERIDES: 130 mg/dL (ref <150)
VLDL: 26 mg/dL (ref 0–40)

## 2015-11-18 LAB — CBC
HEMATOCRIT: 38.5 % (ref 36.0–46.0)
HEMOGLOBIN: 13 g/dL (ref 12.0–15.0)
MCH: 27.7 pg (ref 26.0–34.0)
MCHC: 33.8 g/dL (ref 30.0–36.0)
MCV: 81.9 fL (ref 78.0–100.0)
Platelets: 301 10*3/uL (ref 150–400)
RBC: 4.7 MIL/uL (ref 3.87–5.11)
RDW: 14.5 % (ref 11.5–15.5)
WBC: 8.9 10*3/uL (ref 4.0–10.5)

## 2015-11-18 LAB — COMPREHENSIVE METABOLIC PANEL
ALT: 23 U/L (ref 14–54)
AST: 22 U/L (ref 15–41)
Albumin: 3.8 g/dL (ref 3.5–5.0)
Alkaline Phosphatase: 75 U/L (ref 38–126)
Anion gap: 11 (ref 5–15)
BUN: <5 mg/dL — ABNORMAL LOW (ref 6–20)
CHLORIDE: 96 mmol/L — AB (ref 101–111)
CO2: 29 mmol/L (ref 22–32)
Calcium: 9.1 mg/dL (ref 8.9–10.3)
Creatinine, Ser: 0.68 mg/dL (ref 0.44–1.00)
Glucose, Bld: 151 mg/dL — ABNORMAL HIGH (ref 65–99)
POTASSIUM: 3.2 mmol/L — AB (ref 3.5–5.1)
Sodium: 136 mmol/L (ref 135–145)
Total Bilirubin: 0.4 mg/dL (ref 0.3–1.2)
Total Protein: 7.4 g/dL (ref 6.5–8.1)

## 2015-11-18 MED ORDER — HYDROMORPHONE HCL 2 MG/ML IJ SOLN
1.0000 mg | INTRAMUSCULAR | Status: DC | PRN
  Administered 2015-11-18 (×2): 1 mg via INTRAVENOUS
  Filled 2015-11-18 (×2): qty 1

## 2015-11-18 MED ORDER — POTASSIUM CHLORIDE CRYS ER 20 MEQ PO TBCR: 40.0000 meq | EXTENDED_RELEASE_TABLET | Freq: Two times a day (BID) | ORAL | Status: DC

## 2015-11-18 MED ORDER — HYDROCODONE-ACETAMINOPHEN 5-325 MG PO TABS
1.0000 | ORAL_TABLET | ORAL | Status: DC | PRN
  Administered 2015-11-18 – 2015-11-19 (×3): 1 via ORAL
  Filled 2015-11-18 (×3): qty 1

## 2015-11-18 MED ORDER — CARBAMAZEPINE 200 MG PO TABS
200.0000 mg | ORAL_TABLET | Freq: Two times a day (BID) | ORAL | Status: DC
  Administered 2015-11-18 – 2015-11-19 (×3): 200 mg via ORAL
  Filled 2015-11-18 (×4): qty 1

## 2015-11-18 MED ORDER — POLYETHYLENE GLYCOL 3350 17 G PO PACK
17.0000 g | PACK | Freq: Every day | ORAL | Status: DC
  Filled 2015-11-18 (×2): qty 1

## 2015-11-18 MED ORDER — POTASSIUM CHLORIDE CRYS ER 20 MEQ PO TBCR
40.0000 meq | EXTENDED_RELEASE_TABLET | Freq: Two times a day (BID) | ORAL | Status: AC
  Administered 2015-11-18 (×2): 40 meq via ORAL
  Filled 2015-11-18 (×2): qty 2

## 2015-11-18 NOTE — Progress Notes (Signed)
Family Medicine Teaching Service Daily Progress Note Intern Pager: [phone]  Patient name: Holly Mendez Medical record number: [id number] Date of birth: 1975/06/12 Age: 40 y.o. Gender: female  Primary Care Provider: [name], MD Consultants: None Code Status: Full  Pt Overview and Major Events to Date:  11/07: Admit for abdominal pain thought to be acute pancreatitis 11/09: Tolerating clear liquids and no emesis overnight, advancing to soft diet  Assessment and Plan: Holly Mendez a 40 y.o.femalepresenting with acute onset abdominal pain. PMH is significant for bipolar disorder, hypertension, morbid obesity, hyperglycemia, chronic back pain, and constipation.  #Abdominal Pain/Constipation/Elevated Lipase, Acute, Imporved:  Etiology currently unknown however initial concern for constipation versus pancreatitis. Pancreatitis thought to be relatively unlikely as lipase is elevated but its improving from 10/05. Constipation seems relatively likely as patient endorses no BMs over the past week. Patient passing stool so obstruction unlikely. EGD on 09/2015 showed pancreatic parenchymal abnormalities consisting of hyperechoic strands and lobularity noted in the entire pancreas concerning for chronic pancreatitis. CT during admission did not show any evidence of bowel obstruction. Abdominal U/S RUQwas neg for gallbladder/liver findings. Labs include TSH low 0.254, troponin neg x3, lactic acid 0.96. Patient seen by Dr. Benson Norway for GI. Discussed this patient with him over the phone and he did not feel the need to be consulted but felt she may benefit from phenergan suppositories. Has long h/o constipation. He said pancreatitis is unlikely and the CT was not concerning for acute abdomen. Abd pain has improved with narcotic use and emesis has resolved as diet is advanced. Pt noted BM overnight x2 with well formed stool though not recorded by RN as of 11/09.  --Liquid diet as  toelrated --NS IV @100cc /hr --Tolerating clear liquids, will advance to soft diet --Zofran q8h PRN --Will stop home Reglan 10 mg TID PRN given pt not taking often and multiple side effects  --Phenergan suppositories q6h PRN  --Decreased MiraLAX to home dose QD --Nursing to track I&O's --Dilauded will be spaced q4h PRN pain, continues to receive every 3 hrs prior --Pepcid 20 mg IV BID --Will obtain lipid panel and A1c  #Asthma, Chronic, Stable: Respiratory status well controlled since admission. Takes Dulera and Albuterol at home.  --Dulera 2 puffs BID  #Hypokalemia, Acute, Stable: K level decreased from 4.3>3.2 over 24 hrs on 11/09.  --K-dur 40 mg BID  #Schitzoaffective Disorder/Bipolar Disorder, Chronic: Uncertain level of control given episodes of emesis during admission and likely not tolerating psych meds. Able to tolerate Abilify overnight, will restart Carbamezapine. --Abilify 10 mg QD --Restarted home Carbamezapine 200 mg BID   FEN/GI: Soft food diet, IVF NS @200cc /hr, Pepcid 20 mg IV BID Prophylaxis: Lovenox  Disposition: Pending improvement of abdominal pain and tolerating diet, will try and wean off IV meds  Subjective:  Patient laying in bed well appearing in no distress. Says she feels much better than yesterday since receiving the Dilaudid (last dose 1 hr prior). Nausea improved w/o emesis since last episode yesterday. Tolerating clear liquids and PO meds. Says she is willing to advance diet and is agreeable to decrease pain medications.  Objective: Temp:  [97.7 F (36.5 C)-98.9 F (37.2 C)] 97.7 F (36.5 C) (11/09 0518) Pulse Rate:  [75-92] 80 (11/09 0518) Resp:  [14-18] 18 (11/08 2112) BP: (139-151)/(73-89) 142/87 (11/09 0518) SpO2:  [93 %-100 %] 100 % (11/09 0518) Physical Exam: General: obese, well nourished, well developed, no acute distress with non-toxic appearance HEENT: normocephalic, atraumatic, moist mucous membranes CV: regular rate  and rhythm  without murmurs, rubs, or gallops Lungs: clear to auscultation bilaterally with normal work of breathing Abdomen: soft, non-distended, minimal epigastric tenderness sparing RUQ w/o rebound or guarding, no masses or organomegaly palpable, normoactive bowel sounds Skin: warm, dry, no rashes or lesions, cap refill < 2 seconds Extremities: warm and well perfused, normal tone  Laboratory:  Recent Labs Lab 11/16/15 1532 11/16/15 2343 11/18/15 0409  WBC 9.0 10.6* 8.9  HGB 13.1 13.1 13.0  HCT 38.2 37.6 38.5  PLT 268 271 301    Recent Labs Lab 11/16/15 0544 11/16/15 1532 11/16/15 2343 11/18/15 0409  NA 138  --  134* 136  K 3.8  --  4.2 3.2*  CL 101  --  98* 96*  CO2 23  --  25 29  BUN 7  --  <5* <5*  CREATININE 0.87 0.81 0.71 0.68  CALCIUM 9.7  --  8.9 9.1  PROT 7.8  --  7.0 7.4  BILITOT 0.4  --  1.0 0.4  ALKPHOS 87  --  78 75  ALT 26  --  22 23  AST 30  --  20 22  GLUCOSE 153*  --  136* 151*   Troponin: neg x3 B-hCG: neg TSH: 0.254 LDH: 0.96 UDS: neg UA: neg Lipase: 100 (184 11/05)  Imaging/Diagnostic Tests: CT ABDOMEN PELVIS W CONTRAST (11/17/15) FINDINGS: Lower chest: Mild basilar atelectasis. Hepatobiliary: No focal liver abnormality.No evidence of biliary obstruction or stone. Pancreas: Unremarkable. Spleen: Unremarkable. Adrenals/Urinary Tract: Negative adrenals. No hydronephrosis or stone. Full urinary bladder. No wall thickening. Stomach/Bowel: No obstruction. No appendicitis. Vascular/Lymphatic: No acute vascular abnormality. No mass or adenopathy. Reproductive: Fluid again noted in the posterior vaginal fornix. There may also be nabothian cysts. Other: No ascites or pneumoperitoneum. Musculoskeletal: No acute or aggressive finding  IMPRESSION: No explanation for acute abdominal pain.  [name] RUQ (11/17/15) FINDINGS: Gallbladder: No gallstones or wall thickening visualized. No sonographic Murphy sign noted by sonographer. Common bile duct:  Diameter: 7.9 mm tapering to a normal caliber of the pancreatic head. Liver: No focal lesion identified. Within normal limits in parenchymal echogenicity.  IMPRESSION: No cholelithiasis or sonographic evidence of acute cholecystitis.  Beattie Bing, DO 11/18/2015, 7:29 AM PGY-1, [place] Intern pager: [phone], text pages welcome

## 2015-11-19 LAB — BASIC METABOLIC PANEL
Anion gap: 12 (ref 5–15)
Anion gap: 11 (ref 5–15)
BUN: <5 mg/dL — ABNORMAL LOW (ref 6–20)
BUN: 5 mg/dL — ABNORMAL LOW (ref 6–20)
CHLORIDE: 96 mmol/L — AB (ref 101–111)
CHLORIDE: 94 mmol/L — AB (ref 101–111)
CO2: 23 mmol/L (ref 22–32)
CO2: 28 mmol/L (ref 22–32)
Calcium: 9.3 mg/dL (ref 8.9–10.3)
Calcium: 10 mg/dL (ref 8.9–10.3)
Creatinine, Ser: 0.72 mg/dL (ref 0.44–1.00)
Creatinine, Ser: 0.82 mg/dL (ref 0.44–1.00)
GFR calc non Af Amer: >60 mL/min (ref >60)
GFR calc non Af Amer: >60 mL/min (ref >60)
Glucose, Bld: 117 mg/dL — ABNORMAL HIGH (ref 65–99)
Glucose, Bld: 127 mg/dL — ABNORMAL HIGH (ref 65–99)
POTASSIUM: 5.2 mmol/L — AB (ref 3.5–5.1)
POTASSIUM: 3.8 mmol/L (ref 3.5–5.1)
SODIUM: 131 mmol/L — AB (ref 135–145)
SODIUM: 133 mmol/L — AB (ref 135–145)

## 2015-11-19 LAB — CBC
HEMATOCRIT: 35.1 % — AB (ref 36.0–46.0)
HEMOGLOBIN: 11.4 g/dL — AB (ref 12.0–15.0)
MCH: 26 pg (ref 26.0–34.0)
MCHC: 32.5 g/dL (ref 30.0–36.0)
MCV: 80.1 fL (ref 78.0–100.0)
Platelets: 171 10*3/uL (ref 150–400)
RBC: 4.38 MIL/uL (ref 3.87–5.11)
RDW: 14.5 % (ref 11.5–15.5)
WBC: 8.8 10*3/uL (ref 4.0–10.5)

## 2015-11-19 LAB — HEMOGLOBIN A1C
Hgb A1c MFr Bld: 6.3 % — ABNORMAL HIGH (ref 4.8–5.6)
MEAN PLASMA GLUCOSE: 134 mg/dL

## 2015-11-19 MED ORDER — ONDANSETRON 4 MG PO TBDP: 4.0000 mg | ORAL_TABLET | Freq: Three times a day (TID) | ORAL | 0 refills | Status: DC | PRN

## 2015-11-19 MED ORDER — HYDROCODONE-ACETAMINOPHEN 5-325 MG PO TABS: 1.0000 | ORAL_TABLET | Freq: Four times a day (QID) | ORAL | 0 refills | Status: DC | PRN

## 2015-11-19 NOTE — Progress Notes (Signed)
Patient refused a new bag of normal saline and requested her IV be saline locked so that she could rest.  However, she still agrees to take all of her future secondary IV medications.  Will notify MD on call.

## 2015-11-19 NOTE — Progress Notes (Signed)
Family Medicine Teaching Service Daily Progress Note Intern Pager: [phone]  Patient name: Holly Mendez Medical record number: [id number] Date of birth: 1976/01/05 Age: 40 y.o. Gender: female  Primary Care Provider: [name], MD Consultants: None Code Status: Full  Pt Overview and Major Events to Date:  11/07: Admit for abdominal pain thought to be acute pancreatitis 11/09: Tolerating clear liquids and no emesis overnight, advancing to soft diet  Assessment and Plan: Holly Guillet Brown-Whitsettis a 40 y.o.femalepresenting with acute onset abdominal pain. PMH is significant for bipolar disorder, hypertension, morbid obesity, hyperglycemia, chronic back pain, and constipation.  #Abdominal Pain/Constipation/Elevated Lipase, Acute, Imporved:  Etiology currently unknown however initial concern for constipation versus pancreatitis. Pancreatitis thought to be relatively unlikely as lipase is elevated but its improving from 10/05. Constipation seems relatively likely as patient endorses no BMs over the past week. Patient passing stool so obstruction unlikely. EGD on 09/2015 showed pancreatic parenchymal abnormalities consisting of hyperechoic strands and lobularity noted in the entire pancreas concerning for chronic pancreatitis. CT during admission did not show any evidence of bowel obstruction. Abdominal U/S RUQwas neg for gallbladder/liver findings. Labs include TSH low 0.254, troponin neg x3, lactic acid 0.96. Patient seen by Dr. Benson Norway for GI. Discussed this patient with him over the phone and he did not feel the need to be consulted but felt she may benefit from phenergan suppositories. Has long h/o constipation. He said pancreatitis is unlikely and the CT was not concerning for acute abdomen. Abd pain has improved with narcotic use and emesis has resolved as diet is advanced. Pt noted BM overnight x2 with well formed stool. --Liquid diet as toelrated --NS IV @100cc /hr --Tolerating  clear liquids, will advance to soft diet --Zofran q8h PRN --Stop home Reglan 10 mg TID PRN on d/c given pt not taking often and multiple side effects  --Phenergan suppositories q6h PRN  --MiraLAX QD --Nursing to track I&O's --Norco 5-325 q4h PRN pain, patient has received every dose --Pepcid 20 mg IV BID  #Hyperlipidemia, New Finding: Lipid panel LDL 161. A1c 6.3. ASCVD risk 8.6%. Meets criteria for high intensity statin. --Start Atorvastatin 20 mg QD on d/c  #Asthma, Chronic, Stable: Respiratory status well controlled since admission. Takes Dulera and Albuterol at home.  --Dulera 2 puffs BID  #Hypokalemia, Acute, Stable: K level increased from 3.2>5.2, hemolyzed BMET. Tolerating soft diet, will continue to advance diet.  #Hyponatremia, Acute: Na level 131 today, hemolyzed BMET. Off IVF and tolerating soft diet, will continue to advance diet.  #Schitzoaffective Disorder/Bipolar Disorder, Chronic: Uncertain level of control given episodes of emesis during admission and likely not tolerating psych meds. --Abilify 10 mg QD --Carbamezapine 200 mg BID   FEN/GI: Soft food diet, IVF NS @100cc /hr, Pepcid 20 mg IV BID Prophylaxis: Lovenox  Disposition: Pending improvement of abdominal pain and tolerating diet, wean off IV meds  Subjective:  Patient sitting up in bed well appearing in no distress. Says pain is well controlled. Denies nausea. Tolerating soft diet and PO meds. Says she is wanting to go home.  Objective: Temp:  [97.4 F (36.3 C)-99.1 F (37.3 C)] 97.4 F (36.3 C) (11/10 0438) Pulse Rate:  [80-83] 82 (11/10 0438) Resp:  [18] 18 (11/10 0438) BP: (142-150)/(75-92) 142/75 (11/10 0438) SpO2:  [94 %-100 %] 94 % (11/10 0438) Physical Exam: General: obese, well nourished, well developed, no acute distress with non-toxic appearance HEENT: normocephalic, atraumatic, moist mucous membranes CV: regular rate and rhythm without murmurs, rubs, or gallops Lungs: clear to  auscultation  bilaterally with normal work of breathing Abdomen: soft, non-distended, non-tenderness w/o rebound or guarding, no masses or organomegaly palpable, normoactive bowel sounds Skin: warm, dry, no rashes or lesions, cap refill < 2 seconds Extremities: warm and well perfused, normal tone  Laboratory:  Recent Labs Lab 11/16/15 2343 11/18/15 0409 11/19/15 0539  WBC 10.6* 8.9 8.8  HGB 13.1 13.0 11.4*  HCT 37.6 38.5 35.1*  PLT 271 301 171    Recent Labs Lab 11/16/15 0544  11/16/15 2343 11/18/15 0409 11/19/15 0539  NA 138  --  134* 136 131*  K 3.8  --  4.2 3.2* 5.2*  CL 101  --  98* 96* 96*  CO2 23  --  25 29 23   BUN 7  --  <5* <5* <5*  CREATININE 0.87  < > 0.71 0.68 0.72  CALCIUM 9.7  --  8.9 9.1 9.3  PROT 7.8  --  7.0 7.4  --   BILITOT 0.4  --  1.0 0.4  --   ALKPHOS 87  --  78 75  --   ALT 26  --  22 23  --   AST 30  --  20 22  --   GLUCOSE 153*  --  136* 151* 117*  < > = values in this interval not displayed. Troponin: neg x3 B-hCG: neg TSH: 0.254 LDH: 0.96 UDS: neg UA: neg Lipase: 100 (184 11/05)  Imaging/Diagnostic Tests: CT ABDOMEN PELVIS W CONTRAST (11/17/15) FINDINGS: Lower chest: Mild basilar atelectasis. Hepatobiliary: No focal liver abnormality.No evidence of biliary obstruction or stone. Pancreas: Unremarkable. Spleen: Unremarkable. Adrenals/Urinary Tract: Negative adrenals. No hydronephrosis or stone. Full urinary bladder. No wall thickening. Stomach/Bowel: No obstruction. No appendicitis. Vascular/Lymphatic: No acute vascular abnormality. No mass or adenopathy. Reproductive: Fluid again noted in the posterior vaginal fornix. There may also be nabothian cysts. Other: No ascites or pneumoperitoneum. Musculoskeletal: No acute or aggressive finding  IMPRESSION: No explanation for acute abdominal pain.  [name] RUQ (11/17/15) FINDINGS: Gallbladder: No gallstones or wall thickening visualized. No sonographic Murphy sign noted by  sonographer. Common bile duct: Diameter: 7.9 mm tapering to a normal caliber of the pancreatic head. Liver: No focal lesion identified. Within normal limits in parenchymal echogenicity.  IMPRESSION: No cholelithiasis or sonographic evidence of acute cholecystitis.  Stryker Bing, DO 11/19/2015, 7:27 AM PGY-1, [place] Intern pager: [phone], text pages welcome

## 2015-11-19 NOTE — Progress Notes (Signed)
Pt left prior to getting results of blood work. Pt stated she had a situation at home that she needed to take care of.

## 2015-11-19 NOTE — Discharge Instructions (Signed)
You were admitted for pancreatitis. We gave you fluids and pain meds until your pain was controlled. Please take the pain meds only when needed. Follow up with the clinic at 10:45 AM on 11/24/2015.

## 2015-11-24 ENCOUNTER — Encounter: Payer: Self-pay | Admitting: Family Medicine

## 2015-11-24 ENCOUNTER — Ambulatory Visit (INDEPENDENT_AMBULATORY_CARE_PROVIDER_SITE_OTHER): Payer: Medicaid Other | Admitting: Family Medicine

## 2015-11-24 VITALS — BP 158/92 | HR 93 | Temp 98.4°F | Wt 263.0 lb

## 2015-11-24 DIAGNOSIS — R1013 Epigastric pain: Secondary | ICD-10-CM | POA: Diagnosis not present

## 2015-11-24 DIAGNOSIS — K861 Other chronic pancreatitis: Secondary | ICD-10-CM

## 2015-11-24 DIAGNOSIS — G8929 Other chronic pain: Secondary | ICD-10-CM | POA: Diagnosis not present

## 2015-11-24 MED ORDER — SUCRALFATE 1 G PO TABS: ORAL_TABLET | ORAL | 1 refills | Status: DC

## 2015-11-25 ENCOUNTER — Telehealth: Payer: Self-pay | Admitting: Family Medicine

## 2015-11-25 MED ORDER — METRONIDAZOLE 500 MG PO TABS: 500.0000 mg | ORAL_TABLET | Freq: Two times a day (BID) | ORAL | 0 refills | Status: AC

## 2015-11-25 NOTE — Telephone Encounter (Signed)
rx sent as requested.

## 2015-11-25 NOTE — Telephone Encounter (Signed)
Will forward to PCP to determine approval or denial.  Derl Barrow, RN

## 2015-11-25 NOTE — Telephone Encounter (Signed)
Has BV. Was recently in hospital and the medicine she was given for bv was not returned to pt when she left. She would like to have a refill called in to [facility]. Please let pt know when it is called in

## 2015-11-26 ENCOUNTER — Telehealth: Payer: Self-pay | Admitting: Family Medicine

## 2015-11-26 NOTE — Progress Notes (Signed)
    CHIEF COMPLAINT / HPI:   #1. Follow-up recent hospitalization for chronic recurrent pancreatitis. She still having problems eating and holding fluids down. She feels generally weak. She's having problems getting her activities of daily living including her typical household chores done. They gave her some pills for nausea when she left and that seemed to help some. She would like some additional pills. Movements are scant right now probably secondary to this small amount that she is managing to eat. No diarrhea. No blood in her stool. No mucus in her stool. She's had no fever at home.  REVIEW OF SYSTEMS:  See history of present illness  OBJECTIVE:  Vital signs are reviewed.   Vital signs reviewed. GENERAL: Well-developed, well-nourished, no acute distress. CARDIOVASCULAR: Regular rate and rhythm LUNGS: Clear to auscultation bilaterally, no rales or wheeze. ABDOMEN: Soft positive bowel sounds. Mildly tender to palpation in the midepigastric area but no rebound or guarding. No masses noted. 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 Ambien is without assistance    ASSESSMENT / PLAN: #1. Hospital follow-up for recent admission for acute/chronic pancreatitis. I reviewed her last EGD note. She asks me to set her up for some personal care services as she is not able to do all her ADLs at home. Not sure she'll qualify we'll be happy see in the paperwork through. I'll give her some more nausea pills. Like see her back in 3-4 weeks.

## 2015-11-26 NOTE — Telephone Encounter (Signed)
Dear [name] Team Ok--there was a delay--tell her to call me if she has not heard from someone by next Wednesday  I apologize for the delay [facility]! [name]

## 2015-11-26 NOTE — Telephone Encounter (Signed)
Pt called regarding the paperwork needed for a home health aide.  She stated dr Nori Riis told her to call back today if pt hadnt heard anything back from dr neal or Neoma Laming

## 2015-11-26 NOTE — Telephone Encounter (Signed)
Pt informed. Will call next Wed if she hasn't heard from anyone. [name], CMA

## 2015-11-30 ENCOUNTER — Telehealth: Payer: Self-pay | Admitting: Family Medicine

## 2015-11-30 NOTE — Progress Notes (Signed)
Personal Care application completed by MD, form faxed to [facility].  LCSW called [facility] to confirm receipt of application.  Per Elta Guadel[place], application received, they will contact patient to schedule an assessment or she may contact them.  Called patient to provide an update.    Plan: Patient will reach out to [facility] to schedule her assessment.   Casimer Lanius, LCSW Licensed Clinical Social Worker [place]   [phone] 10:32 AM

## 2015-11-30 NOTE — Telephone Encounter (Signed)
Spoke to pt. She is gong to stop taking the symbicort. She will get a cane at [employer] or a drug store. Pt wanted to thank Dr. Nori Riis for the information. [name], CMA

## 2015-11-30 NOTE — Telephone Encounter (Signed)
Pt had a home visit yesterday, requesting orders for a cane due to pain in right leg. Pt has not been to [facility] since August, so pt has not been on [facility] medicine for some time. Nira Conn will be making pt an appointment @ [facility]. Pt has also not been taking asthma medication correctly. Pt has not been using albuterol at all, pt has been using Symbicort as needed instead of daily. Nira Conn would like to know if pt should be taking the Symbicort daily since she has only used it twice as a rescue inhaler. Pt has also not been eating or drinking much due pancreatis and doing so makes her nauseous so Nira Conn is setting pt up with a dietician with [facility]. Any questions or concerns please call Heather. Thanks! ep

## 2015-11-30 NOTE — Telephone Encounter (Signed)
Dear [name] Team Can u call patient and tell her if she has not been using the symbicort daily and is doing Ok then she can just stop it (see note from [facility] nurse for questions. RE: her request for an order for a cane, I am afraid she does not currently have a dx that would qualify her for any kind of assistive device but she can get a cane without a Rx--it is OTC. THANKS! [name]

## 2015-12-20 ENCOUNTER — Encounter (HOSPITAL_COMMUNITY): Payer: Self-pay | Admitting: Emergency Medicine

## 2015-12-20 ENCOUNTER — Emergency Department (HOSPITAL_COMMUNITY)
Admission: EM | Admit: 2015-12-20 | Discharge: 2015-12-21 | Disposition: A | Discharge status: Home / Self Care | Payer: Medicaid Other | Attending: Emergency Medicine | Admitting: Emergency Medicine

## 2015-12-20 DIAGNOSIS — R1084 Generalized abdominal pain: Secondary | ICD-10-CM | POA: Insufficient documentation

## 2015-12-20 DIAGNOSIS — I10 Essential (primary) hypertension: Secondary | ICD-10-CM | POA: Insufficient documentation

## 2015-12-20 DIAGNOSIS — R112 Nausea with vomiting, unspecified: Secondary | ICD-10-CM | POA: Diagnosis not present

## 2015-12-20 DIAGNOSIS — Z79899 Other long term (current) drug therapy: Secondary | ICD-10-CM | POA: Diagnosis not present

## 2015-12-20 DIAGNOSIS — F17210 Nicotine dependence, cigarettes, uncomplicated: Secondary | ICD-10-CM | POA: Diagnosis not present

## 2015-12-20 DIAGNOSIS — R109 Unspecified abdominal pain: Secondary | ICD-10-CM | POA: Diagnosis present

## 2015-12-20 DIAGNOSIS — J45909 Unspecified asthma, uncomplicated: Secondary | ICD-10-CM | POA: Diagnosis not present

## 2015-12-20 LAB — CBC
HCT: 39 % (ref 36.0–46.0)
Hemoglobin: 13.6 g/dL (ref 12.0–15.0)
MCH: 27.7 pg (ref 26.0–34.0)
MCHC: 34.9 g/dL (ref 30.0–36.0)
MCV: 79.4 fL (ref 78.0–100.0)
PLATELETS: 320 10*3/uL (ref 150–400)
RBC: 4.91 MIL/uL (ref 3.87–5.11)
RDW: 14.1 % (ref 11.5–15.5)
WBC: 8.5 10*3/uL (ref 4.0–10.5)

## 2015-12-20 LAB — URINALYSIS, ROUTINE W REFLEX MICROSCOPIC
BILIRUBIN URINE: NEGATIVE
Bacteria, UA: NONE SEEN
Glucose, UA: NEGATIVE mg/dL
Ketones, ur: 5 mg/dL — AB
Nitrite: NEGATIVE
Protein, ur: 100 mg/dL — AB
SPECIFIC GRAVITY, URINE: 1.032 — AB (ref 1.005–1.030)
pH: 5 (ref 5.0–8.0)

## 2015-12-20 LAB — COMPREHENSIVE METABOLIC PANEL
ALBUMIN: 4.2 g/dL (ref 3.5–5.0)
ALK PHOS: 92 U/L (ref 38–126)
ALT: 26 U/L (ref 14–54)
AST: 21 U/L (ref 15–41)
Anion gap: 8 (ref 5–15)
BUN: 9 mg/dL (ref 6–20)
CALCIUM: 9.2 mg/dL (ref 8.9–10.3)
CHLORIDE: 100 mmol/L — AB (ref 101–111)
CO2: 28 mmol/L (ref 22–32)
CREATININE: 0.72 mg/dL (ref 0.44–1.00)
GFR calc Af Amer: >60 mL/min (ref >60)
GFR calc non Af Amer: >60 mL/min (ref >60)
GLUCOSE: 164 mg/dL — AB (ref 65–99)
Potassium: 3.6 mmol/L (ref 3.5–5.1)
SODIUM: 136 mmol/L (ref 135–145)
Total Bilirubin: 0.4 mg/dL (ref 0.3–1.2)
Total Protein: 8.1 g/dL (ref 6.5–8.1)

## 2015-12-20 LAB — LIPASE, BLOOD: LIPASE: 97 U/L — AB (ref 11–51)

## 2015-12-20 MED ORDER — SODIUM CHLORIDE 0.9 % IV BOLUS (SEPSIS)
2000.0000 mL | Freq: Once | INTRAVENOUS | Status: AC
  Administered 2015-12-20: 2000 mL via INTRAVENOUS

## 2015-12-20 MED ORDER — DICYCLOMINE HCL 10 MG/ML IM SOLN
20.0000 mg | Freq: Once | INTRAMUSCULAR | Status: AC
  Administered 2015-12-21: 20 mg via INTRAMUSCULAR
  Filled 2015-12-20: qty 2

## 2015-12-20 MED ORDER — ONDANSETRON 4 MG PO TBDP
4.0000 mg | ORAL_TABLET | Freq: Once | ORAL | Status: AC | PRN
  Administered 2015-12-20: 4 mg via ORAL
  Filled 2015-12-20: qty 1

## 2015-12-20 MED ORDER — ONDANSETRON HCL 4 MG/2ML IJ SOLN
4.0000 mg | Freq: Once | INTRAMUSCULAR | Status: AC
  Administered 2015-12-20: 4 mg via INTRAVENOUS
  Filled 2015-12-20: qty 2

## 2015-12-20 MED ORDER — KETOROLAC TROMETHAMINE 30 MG/ML IJ SOLN
30.0000 mg | Freq: Once | INTRAMUSCULAR | Status: AC
  Administered 2015-12-20: 30 mg via INTRAVENOUS
  Filled 2015-12-20: qty 1

## 2015-12-20 MED ORDER — FAMOTIDINE IN NACL 20-0.9 MG/50ML-% IV SOLN
20.0000 mg | INTRAVENOUS | Status: AC
  Administered 2015-12-20: 20 mg via INTRAVENOUS
  Filled 2015-12-20: qty 50

## 2015-12-20 NOTE — ED Notes (Signed)
EMT called pt to recheck vitals and get a urine sample but pt stated that she was tired and wanted to wait.

## 2015-12-20 NOTE — ED Triage Notes (Signed)
Per EMS, patient from home, c/o abdominal pain, N/V since yesterday. Hx pancreatitis. Patient states the pain began after eating fried chicken. Denies chest pain, SOB, diarrhea.
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
## 2015-12-21 MED ORDER — SODIUM CHLORIDE 0.9 % IV BOLUS (SEPSIS)
1000.0000 mL | Freq: Once | INTRAVENOUS | Status: AC
  Administered 2015-12-21: 1000 mL via INTRAVENOUS

## 2015-12-21 MED ORDER — ONDANSETRON 4 MG PO TBDP: 4.0000 mg | ORAL_TABLET | Freq: Three times a day (TID) | ORAL | 0 refills | Status: DC | PRN

## 2015-12-21 MED ORDER — PROMETHAZINE HCL 25 MG RE SUPP: 25.0000 mg | Freq: Four times a day (QID) | RECTAL | 0 refills | Status: DC | PRN

## 2015-12-21 MED ORDER — PROMETHAZINE HCL 25 MG/ML IJ SOLN
12.5000 mg | Freq: Once | INTRAMUSCULAR | Status: AC
  Administered 2015-12-21: 12.5 mg via INTRAVENOUS
  Filled 2015-12-21: qty 1

## 2015-12-23 ENCOUNTER — Other Ambulatory Visit: Payer: Self-pay | Admitting: Family Medicine

## 2015-12-24 ENCOUNTER — Inpatient Hospital Stay: Payer: Medicaid Other | Admitting: Family Medicine

## 2015-12-30 ENCOUNTER — Inpatient Hospital Stay: Payer: Medicaid Other | Admitting: Family Medicine

## 2016-01-15 ENCOUNTER — Emergency Department (HOSPITAL_COMMUNITY)
Admission: EM | Admit: 2016-01-15 | Discharge: 2016-01-15 | Disposition: A | Discharge status: Home / Self Care | Payer: Medicaid Other | Attending: Emergency Medicine | Admitting: Emergency Medicine

## 2016-01-15 ENCOUNTER — Encounter (HOSPITAL_COMMUNITY): Payer: Self-pay | Admitting: Emergency Medicine

## 2016-01-15 DIAGNOSIS — K861 Other chronic pancreatitis: Secondary | ICD-10-CM | POA: Diagnosis not present

## 2016-01-15 DIAGNOSIS — R112 Nausea with vomiting, unspecified: Secondary | ICD-10-CM | POA: Diagnosis present

## 2016-01-15 DIAGNOSIS — J45909 Unspecified asthma, uncomplicated: Secondary | ICD-10-CM | POA: Diagnosis not present

## 2016-01-15 DIAGNOSIS — F17210 Nicotine dependence, cigarettes, uncomplicated: Secondary | ICD-10-CM | POA: Insufficient documentation

## 2016-01-15 DIAGNOSIS — I10 Essential (primary) hypertension: Secondary | ICD-10-CM | POA: Diagnosis not present

## 2016-01-15 LAB — URINALYSIS, ROUTINE W REFLEX MICROSCOPIC
Bilirubin Urine: NEGATIVE
GLUCOSE, UA: NEGATIVE mg/dL
HGB URINE DIPSTICK: NEGATIVE
KETONES UR: NEGATIVE mg/dL
Leukocytes, UA: NEGATIVE
Nitrite: NEGATIVE
PH: 6 (ref 5.0–8.0)
PROTEIN: 100 mg/dL — AB
Specific Gravity, Urine: 1.03 (ref 1.005–1.030)

## 2016-01-15 LAB — CBC WITH DIFFERENTIAL/PLATELET
Basophils Absolute: 0 10*3/uL (ref 0.0–0.1)
Basophils Relative: 0 %
EOS ABS: 0 10*3/uL (ref 0.0–0.7)
EOS PCT: 0 %
HCT: 38.9 % (ref 36.0–46.0)
Hemoglobin: 13.1 g/dL (ref 12.0–15.0)
LYMPHS ABS: 1.2 10*3/uL (ref 0.7–4.0)
LYMPHS PCT: 13 %
MCH: 27.2 pg (ref 26.0–34.0)
MCHC: 33.7 g/dL (ref 30.0–36.0)
MCV: 80.9 fL (ref 78.0–100.0)
MONO ABS: 0.3 10*3/uL (ref 0.1–1.0)
MONOS PCT: 3 %
Neutro Abs: 7.9 10*3/uL — ABNORMAL HIGH (ref 1.7–7.7)
Neutrophils Relative %: 84 %
PLATELETS: 302 10*3/uL (ref 150–400)
RBC: 4.81 MIL/uL (ref 3.87–5.11)
RDW: 13.9 % (ref 11.5–15.5)
WBC: 9.3 10*3/uL (ref 4.0–10.5)

## 2016-01-15 LAB — COMPREHENSIVE METABOLIC PANEL
ALBUMIN: 4.2 g/dL (ref 3.5–5.0)
ALT: 19 U/L (ref 14–54)
AST: 21 U/L (ref 15–41)
Alkaline Phosphatase: 91 U/L (ref 38–126)
Anion gap: 8 (ref 5–15)
BUN: 8 mg/dL (ref 6–20)
CHLORIDE: 102 mmol/L (ref 101–111)
CO2: 25 mmol/L (ref 22–32)
CREATININE: 0.78 mg/dL (ref 0.44–1.00)
Calcium: 9.4 mg/dL (ref 8.9–10.3)
GFR calc Af Amer: >60 mL/min (ref >60)
GLUCOSE: 173 mg/dL — AB (ref 65–99)
POTASSIUM: 4.2 mmol/L (ref 3.5–5.1)
Sodium: 135 mmol/L (ref 135–145)
Total Bilirubin: 0.4 mg/dL (ref 0.3–1.2)
Total Protein: 8.2 g/dL — ABNORMAL HIGH (ref 6.5–8.1)

## 2016-01-15 LAB — I-STAT BETA HCG BLOOD, ED (MC, WL, AP ONLY): I-stat hCG, quantitative: <5 m[IU]/mL (ref <5)

## 2016-01-15 LAB — URINALYSIS, MICROSCOPIC (REFLEX): RBC / HPF: NONE SEEN RBC/hpf (ref 0–5)

## 2016-01-15 LAB — LIPASE, BLOOD: LIPASE: 89 U/L — AB (ref 11–51)

## 2016-01-15 MED ORDER — METOCLOPRAMIDE HCL 5 MG/ML IJ SOLN
10.0000 mg | Freq: Once | INTRAMUSCULAR | Status: AC
  Administered 2016-01-15: 10 mg via INTRAVENOUS
  Filled 2016-01-15: qty 2

## 2016-01-15 MED ORDER — ONDANSETRON HCL 4 MG/2ML IJ SOLN
4.0000 mg | Freq: Once | INTRAMUSCULAR | Status: AC
  Administered 2016-01-15: 4 mg via INTRAVENOUS
  Filled 2016-01-15: qty 2

## 2016-01-15 MED ORDER — PROMETHAZINE HCL 25 MG/ML IJ SOLN
12.5000 mg | Freq: Once | INTRAMUSCULAR | Status: AC
  Administered 2016-01-15: 12.5 mg via INTRAVENOUS
  Filled 2016-01-15: qty 1

## 2016-01-15 MED ORDER — FAMOTIDINE IN NACL 20-0.9 MG/50ML-% IV SOLN
20.0000 mg | Freq: Once | INTRAVENOUS | Status: AC
  Administered 2016-01-15: 20 mg via INTRAVENOUS
  Filled 2016-01-15: qty 50

## 2016-01-15 MED ORDER — SODIUM CHLORIDE 0.9 % IV BOLUS (SEPSIS)
1000.0000 mL | Freq: Once | INTRAVENOUS | Status: AC
  Administered 2016-01-15: 1000 mL via INTRAVENOUS

## 2016-01-15 MED ORDER — KETOROLAC TROMETHAMINE 30 MG/ML IJ SOLN
30.0000 mg | Freq: Once | INTRAMUSCULAR | Status: AC
  Administered 2016-01-15: 30 mg via INTRAVENOUS
  Filled 2016-01-15: qty 1
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
## 2016-01-15 NOTE — ED Triage Notes (Signed)
Pt is from home.  Hx pancreatitis.  Pt reports to EMS that these symptoms feel similar.

## 2016-01-15 NOTE — ED Notes (Signed)
RN contacted lab to verify blood orders for this Pt as orders disappeared.  RN spoke to supervisor of lab who will handle it on her end to be sure we get results.

## 2016-01-24 ENCOUNTER — Other Ambulatory Visit: Payer: Self-pay | Admitting: Family Medicine

## 2016-02-02 ENCOUNTER — Telehealth: Payer: Self-pay | Admitting: Family Medicine

## 2016-02-02 DIAGNOSIS — R1013 Epigastric pain: Principal | ICD-10-CM

## 2016-02-02 DIAGNOSIS — G8929 Other chronic pain: Secondary | ICD-10-CM

## 2016-02-02 NOTE — Telephone Encounter (Signed)
Pt needs a referral for pain management, pt has medicaid. ep

## 2016-02-06 ENCOUNTER — Encounter (HOSPITAL_COMMUNITY): Payer: Self-pay | Admitting: Emergency Medicine

## 2016-02-06 ENCOUNTER — Encounter (HOSPITAL_COMMUNITY): Payer: Self-pay | Admitting: *Deleted

## 2016-02-06 ENCOUNTER — Emergency Department (HOSPITAL_COMMUNITY)
Admission: EM | Admit: 2016-02-06 | Discharge: 2016-02-06 | Disposition: A | Discharge status: Home / Self Care | Payer: Medicaid Other | Attending: Emergency Medicine | Admitting: Emergency Medicine

## 2016-02-06 ENCOUNTER — Emergency Department (HOSPITAL_COMMUNITY)
Admission: EM | Admit: 2016-02-06 | Discharge: 2016-02-06 | Disposition: A | Discharge status: Home / Self Care | Payer: Medicaid Other | Source: Home / Self Care | Attending: Emergency Medicine | Admitting: Emergency Medicine

## 2016-02-06 ENCOUNTER — Telehealth: Payer: Self-pay | Admitting: Family Medicine

## 2016-02-06 DIAGNOSIS — R1013 Epigastric pain: Secondary | ICD-10-CM | POA: Diagnosis not present

## 2016-02-06 DIAGNOSIS — Z79899 Other long term (current) drug therapy: Secondary | ICD-10-CM | POA: Insufficient documentation

## 2016-02-06 DIAGNOSIS — I10 Essential (primary) hypertension: Secondary | ICD-10-CM | POA: Insufficient documentation

## 2016-02-06 DIAGNOSIS — J45909 Unspecified asthma, uncomplicated: Secondary | ICD-10-CM | POA: Diagnosis not present

## 2016-02-06 DIAGNOSIS — F17210 Nicotine dependence, cigarettes, uncomplicated: Secondary | ICD-10-CM | POA: Insufficient documentation

## 2016-02-06 DIAGNOSIS — K861 Other chronic pancreatitis: Secondary | ICD-10-CM

## 2016-02-06 HISTORY — DX: Malingerer (conscious simulation): Z76.5

## 2016-02-06 LAB — COMPREHENSIVE METABOLIC PANEL
ALBUMIN: 4.2 g/dL (ref 3.5–5.0)
ALK PHOS: 74 U/L (ref 38–126)
ALT: 24 U/L (ref 14–54)
ALT: 29 U/L (ref 14–54)
ANION GAP: 13 (ref 5–15)
AST: 32 U/L (ref 15–41)
AST: 32 U/L (ref 15–41)
Albumin: 4 g/dL (ref 3.5–5.0)
Alkaline Phosphatase: 76 U/L (ref 38–126)
Anion gap: 16 — ABNORMAL HIGH (ref 5–15)
BILIRUBIN TOTAL: 0.8 mg/dL (ref 0.3–1.2)
BUN: 10 mg/dL (ref 6–20)
BUN: 9 mg/dL (ref 6–20)
CALCIUM: 9.6 mg/dL (ref 8.9–10.3)
CHLORIDE: 95 mmol/L — AB (ref 101–111)
CO2: 27 mmol/L (ref 22–32)
CO2: 28 mmol/L (ref 22–32)
Calcium: 10.1 mg/dL (ref 8.9–10.3)
Chloride: 96 mmol/L — ABNORMAL LOW (ref 101–111)
Creatinine, Ser: 0.92 mg/dL (ref 0.44–1.00)
Creatinine, Ser: 0.93 mg/dL (ref 0.44–1.00)
GFR calc Af Amer: >60 mL/min (ref >60)
GFR calc non Af Amer: >60 mL/min (ref >60)
GLUCOSE: 159 mg/dL — AB (ref 65–99)
Glucose, Bld: 148 mg/dL — ABNORMAL HIGH (ref 65–99)
POTASSIUM: 3.7 mmol/L (ref 3.5–5.1)
Potassium: 3.4 mmol/L — ABNORMAL LOW (ref 3.5–5.1)
SODIUM: 137 mmol/L (ref 135–145)
Sodium: 138 mmol/L (ref 135–145)
TOTAL PROTEIN: 7.6 g/dL (ref 6.5–8.1)
Total Bilirubin: 0.6 mg/dL (ref 0.3–1.2)
Total Protein: 8 g/dL (ref 6.5–8.1)

## 2016-02-06 LAB — CBC
HCT: 41.8 % (ref 36.0–46.0)
HEMOGLOBIN: 13.8 g/dL (ref 12.0–15.0)
MCH: 27.3 pg (ref 26.0–34.0)
MCHC: 33 g/dL (ref 30.0–36.0)
MCV: 82.8 fL (ref 78.0–100.0)
PLATELETS: 368 10*3/uL (ref 150–400)
RBC: 5.05 MIL/uL (ref 3.87–5.11)
RDW: 14.7 % (ref 11.5–15.5)
WBC: 10 10*3/uL (ref 4.0–10.5)

## 2016-02-06 LAB — URINALYSIS, ROUTINE W REFLEX MICROSCOPIC
Bilirubin Urine: NEGATIVE
Bilirubin Urine: NEGATIVE
GLUCOSE, UA: 50 mg/dL — AB
Glucose, UA: NEGATIVE mg/dL
HGB URINE DIPSTICK: NEGATIVE
HGB URINE DIPSTICK: NEGATIVE
KETONES UR: 5 mg/dL — AB
Ketones, ur: 5 mg/dL — AB
LEUKOCYTES UA: NEGATIVE
LEUKOCYTES UA: NEGATIVE
NITRITE: NEGATIVE
NITRITE: NEGATIVE
PH: 6 (ref 5.0–8.0)
PROTEIN: 100 mg/dL — AB
PROTEIN: 30 mg/dL — AB
Specific Gravity, Urine: 1.03 (ref 1.005–1.030)
Specific Gravity, Urine: 1.029 (ref 1.005–1.030)
pH: 5 (ref 5.0–8.0)

## 2016-02-06 LAB — CBC WITH DIFFERENTIAL/PLATELET
BASOS ABS: 0 10*3/uL (ref 0.0–0.1)
Basophils Relative: 0 %
Eosinophils Absolute: 0 10*3/uL (ref 0.0–0.7)
Eosinophils Relative: 0 %
HEMATOCRIT: 42.8 % (ref 36.0–46.0)
Hemoglobin: 14.2 g/dL (ref 12.0–15.0)
LYMPHS ABS: 2.7 10*3/uL (ref 0.7–4.0)
Lymphocytes Relative: 24 %
MCH: 27.3 pg (ref 26.0–34.0)
MCHC: 33.2 g/dL (ref 30.0–36.0)
MCV: 82.1 fL (ref 78.0–100.0)
MONO ABS: 0.7 10*3/uL (ref 0.1–1.0)
MONOS PCT: 6 %
Neutro Abs: 7.7 10*3/uL (ref 1.7–7.7)
Neutrophils Relative %: 70 %
Platelets: 393 10*3/uL (ref 150–400)
RBC: 5.21 MIL/uL — AB (ref 3.87–5.11)
RDW: 14.7 % (ref 11.5–15.5)
WBC: 11.1 10*3/uL — AB (ref 4.0–10.5)

## 2016-02-06 LAB — LIPASE, BLOOD
Lipase: 398 U/L — ABNORMAL HIGH (ref 11–51)
Lipase: 56 U/L — ABNORMAL HIGH (ref 11–51)

## 2016-02-06 LAB — I-STAT BETA HCG BLOOD, ED (MC, WL, AP ONLY)
I-stat hCG, quantitative: <5 m[IU]/mL (ref <5)
I-stat hCG, quantitative: <5 m[IU]/mL (ref <5)

## 2016-02-06 LAB — AMYLASE: AMYLASE: 419 U/L — AB (ref 28–100)

## 2016-02-06 MED ORDER — ONDANSETRON 4 MG PO TBDP
ORAL_TABLET | ORAL | Status: AC
  Filled 2016-02-06: qty 1

## 2016-02-06 MED ORDER — HYDROCODONE-ACETAMINOPHEN 5-325 MG PO TABS: 1.0000 | ORAL_TABLET | Freq: Four times a day (QID) | ORAL | 0 refills | Status: DC | PRN

## 2016-02-06 MED ORDER — PROMETHAZINE HCL 25 MG RE SUPP: 25.0000 mg | Freq: Four times a day (QID) | RECTAL | 0 refills | Status: DC | PRN

## 2016-02-06 MED ORDER — HYDROMORPHONE HCL 2 MG/ML IJ SOLN
1.0000 mg | Freq: Once | INTRAMUSCULAR | Status: AC
  Administered 2016-02-06: 1 mg via INTRAVENOUS
  Filled 2016-02-06: qty 1

## 2016-02-06 MED ORDER — ONDANSETRON 4 MG PO TBDP: 4.0000 mg | ORAL_TABLET | Freq: Three times a day (TID) | ORAL | 0 refills | Status: DC | PRN

## 2016-02-06 MED ORDER — SODIUM CHLORIDE 0.9 % IV BOLUS (SEPSIS)
1000.0000 mL | Freq: Once | INTRAVENOUS | Status: AC
  Administered 2016-02-06: 1000 mL via INTRAVENOUS

## 2016-02-06 MED ORDER — ONDANSETRON HCL 4 MG/2ML IJ SOLN
4.0000 mg | Freq: Once | INTRAMUSCULAR | Status: AC
  Administered 2016-02-06: 4 mg via INTRAVENOUS
  Filled 2016-02-06: qty 2

## 2016-02-06 MED ORDER — ONDANSETRON 4 MG PO TBDP
4.0000 mg | ORAL_TABLET | Freq: Once | ORAL | Status: AC | PRN
  Administered 2016-02-06: 4 mg via ORAL

## 2016-02-06 MED ORDER — GI COCKTAIL ~~LOC~~
30.0000 mL | Freq: Once | ORAL | Status: AC
  Administered 2016-02-06: 30 mL via ORAL
  Filled 2016-02-06: qty 30

## 2016-02-06 NOTE — Discharge Instructions (Signed)
Please start clear liquid diet for now. Advance diet to more normal slowly.  Please return to the ER if your symptoms worsen; you have increased pain, fevers, chills, inability to keep any medications down, confusion. Otherwise see the outpatient doctor as requested.

## 2016-02-06 NOTE — ED Triage Notes (Signed)
Patient presents with c/o RUG pain with N/V since Friday with history of the same

## 2016-02-06 NOTE — ED Triage Notes (Signed)
Per EMS: pt c/o upper abd pain and seen here last night for same; pt sts N/V

## 2016-02-06 NOTE — ED Notes (Signed)
Pt laid down on floor in lobby, crying out "my stomach is bleeding".  Camera operator, tech, this nurse at pts side.  Helped pt to wheelchair and assigned room.

## 2016-02-06 NOTE — ED Notes (Signed)
Vomited after drinking fluid.  MD made aware.

## 2016-02-06 NOTE — ED Notes (Signed)
EDP at bedside  

## 2016-02-06 NOTE — ED Notes (Signed)
Pt given fluids for PO challenge. Pt tolerating well. Will continue to monitor.
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
## 2016-02-06 NOTE — ED Notes (Signed)
Pt given sprite to PO challenge 

## 2016-02-06 NOTE — Telephone Encounter (Signed)
Received call from patient on after hours line. Said that she was just discharged from the ED for pancreatitis. Shortly after returning home, patient reports that her symptoms got much worse and she started vomiting blood. Reports that she is now vomiting pure blood and says that she has filled up a small trash can with blood. I instructed the patient to call 911 immediately for transport to the ED. Patient voiced understanding and had no further questions.  Algis Greenhouse. Jerline Pain, [place] Resident PGY-3 02/06/2016 4:40 PM

## 2016-02-06 NOTE — ED Notes (Signed)
Pt O2 sats dropping to upper 80's. 3L [place] placed on pt.
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
## 2016-02-09 ENCOUNTER — Other Ambulatory Visit: Payer: Self-pay | Admitting: Family Medicine

## 2016-02-09 MED ORDER — ONDANSETRON 4 MG PO TBDP: 4.0000 mg | ORAL_TABLET | Freq: Three times a day (TID) | ORAL | 0 refills | Status: DC | PRN

## 2016-02-09 NOTE — Telephone Encounter (Signed)
Pt was seen in ED recently for pancrease flare up, pt is in a lot of pain and would like Dr. Nori Riis to call something in. Pt uses [facility] [facility]. Pt has an appointment on 02-16-16. ep

## 2016-02-09 NOTE — Telephone Encounter (Signed)
Dear Holly Mendez Team I have called in a refillof her ondansetron. I would not recommend narcotics for this flare of pancreatitis. THANKS! [name]

## 2016-02-10 NOTE — Telephone Encounter (Signed)
LVM to have pt call the office. If she calls, please give her the info below.  [name], CMA

## 2016-02-15 ENCOUNTER — Other Ambulatory Visit: Payer: Self-pay | Admitting: Family Medicine

## 2016-02-16 ENCOUNTER — Other Ambulatory Visit: Payer: Self-pay | Admitting: Family Medicine

## 2016-02-16 ENCOUNTER — Inpatient Hospital Stay: Payer: Medicaid Other | Admitting: Family Medicine

## 2016-02-16 MED ORDER — AMITRIPTYLINE HCL 25 MG PO TABS: ORAL_TABLET | ORAL | 1 refills | Status: DC

## 2016-02-16 NOTE — Progress Notes (Signed)
rx did not go thru--retry Holly Mendez

## 2016-02-24 ENCOUNTER — Ambulatory Visit: Payer: Medicaid Other | Admitting: Family Medicine

## 2016-03-08 ENCOUNTER — Encounter: Payer: Self-pay | Admitting: Family Medicine

## 2016-03-08 ENCOUNTER — Ambulatory Visit (INDEPENDENT_AMBULATORY_CARE_PROVIDER_SITE_OTHER): Payer: Medicaid Other | Admitting: Family Medicine

## 2016-03-08 DIAGNOSIS — N760 Acute vaginitis: Secondary | ICD-10-CM

## 2016-03-08 DIAGNOSIS — B9689 Other specified bacterial agents as the cause of diseases classified elsewhere: Secondary | ICD-10-CM

## 2016-03-08 DIAGNOSIS — R1084 Generalized abdominal pain: Secondary | ICD-10-CM | POA: Diagnosis not present

## 2016-03-08 DIAGNOSIS — R1011 Right upper quadrant pain: Secondary | ICD-10-CM | POA: Diagnosis not present

## 2016-03-08 DIAGNOSIS — R112 Nausea with vomiting, unspecified: Secondary | ICD-10-CM | POA: Diagnosis not present

## 2016-03-08 MED ORDER — CLINDAMYCIN PHOSPHATE 2 % VA CREA: 1.0000 | TOPICAL_CREAM | Freq: Every day | VAGINAL | 2 refills | Status: DC

## 2016-03-08 NOTE — Assessment & Plan Note (Signed)
Recurrent w typical symptoms. She says flagyl has not been helpful in given her current issues with nausea think we would do better giving her clindamycin vaginal gel. She'll follow-up if her typical symptoms do not resolve

## 2016-03-08 NOTE — Assessment & Plan Note (Signed)
Unclear if she truly has chronic recurrent pancreatitis or just idiopathic generalized abdominal pain. One of the gastroenterologists had refer her to pain clinic. I'm not going to prescribe any narcotics for her chronic abdominal pain given the fact that her gastroenterologist did not start her on any pancreatic enzymes, the thought must be that she does not have insufficiency of pancreatic enzymes.

## 2016-03-08 NOTE — Assessment & Plan Note (Signed)
She continues to complain of nausea constantly. She is been seen by gastroenterology and they placed her on ondansetron and Phenergan area I'll have anything additional to add. Since the Carafate did not seem to help I would recommend she go ahead and stop that. She is also on Protonix and for now continue that

## 2016-03-08 NOTE — Progress Notes (Signed)
    CHIEF COMPLAINT / HPI:   #1. Chronic epigastric pain. We added sucralfate at last office visit still having breakthrough pain. Wants to know there some additional medicine we could consider. She has a pain clinic referral in process. #2 continues to get recurrent bacterial vaginosis. Has had this off and on for years. Is having another problem about now.  REVIEW OF SYSTEMS:  Complains of nausea constantly. She's had some intermittent vomiting. No fever  OBJECTIVE:  Vital signs are reviewed.   GEN.: Well-developed overweight female no acute distress ABDOMEN: Soft, mildly tender to palpation in the midepigastric area, no rebound or guarding  ASSESSMENT / PLAN: Please see problem oriented charting for details

## 2016-03-28 ENCOUNTER — Other Ambulatory Visit: Payer: Self-pay | Admitting: Family Medicine

## 2016-04-07 ENCOUNTER — Emergency Department (HOSPITAL_COMMUNITY)
Admission: EM | Admit: 2016-04-07 | Discharge: 2016-04-07 | Disposition: A | Discharge status: Home / Self Care | Payer: Medicaid Other | Attending: Emergency Medicine | Admitting: Emergency Medicine

## 2016-04-07 ENCOUNTER — Encounter (HOSPITAL_COMMUNITY): Payer: Self-pay | Admitting: Emergency Medicine

## 2016-04-07 DIAGNOSIS — R1013 Epigastric pain: Secondary | ICD-10-CM

## 2016-04-07 DIAGNOSIS — J45909 Unspecified asthma, uncomplicated: Secondary | ICD-10-CM | POA: Diagnosis not present

## 2016-04-07 DIAGNOSIS — Z79899 Other long term (current) drug therapy: Secondary | ICD-10-CM | POA: Diagnosis not present

## 2016-04-07 DIAGNOSIS — R112 Nausea with vomiting, unspecified: Secondary | ICD-10-CM | POA: Diagnosis not present

## 2016-04-07 DIAGNOSIS — F17210 Nicotine dependence, cigarettes, uncomplicated: Secondary | ICD-10-CM | POA: Insufficient documentation

## 2016-04-07 DIAGNOSIS — I10 Essential (primary) hypertension: Secondary | ICD-10-CM | POA: Diagnosis not present

## 2016-04-07 DIAGNOSIS — G8929 Other chronic pain: Secondary | ICD-10-CM | POA: Insufficient documentation

## 2016-04-07 DIAGNOSIS — R109 Unspecified abdominal pain: Secondary | ICD-10-CM

## 2016-04-07 LAB — COMPREHENSIVE METABOLIC PANEL
ALT: 23 U/L (ref 14–54)
ANION GAP: 11 (ref 5–15)
AST: 20 U/L (ref 15–41)
Albumin: 4.3 g/dL (ref 3.5–5.0)
Alkaline Phosphatase: 91 U/L (ref 38–126)
BILIRUBIN TOTAL: 0.8 mg/dL (ref 0.3–1.2)
BUN: 10 mg/dL (ref 6–20)
CALCIUM: 10 mg/dL (ref 8.9–10.3)
CHLORIDE: 100 mmol/L — AB (ref 101–111)
CO2: 27 mmol/L (ref 22–32)
CREATININE: 0.92 mg/dL (ref 0.44–1.00)
GFR calc Af Amer: >60 mL/min (ref >60)
Glucose, Bld: 164 mg/dL — ABNORMAL HIGH (ref 65–99)
POTASSIUM: 3.9 mmol/L (ref 3.5–5.1)
Sodium: 138 mmol/L (ref 135–145)
TOTAL PROTEIN: 8.6 g/dL — AB (ref 6.5–8.1)

## 2016-04-07 LAB — URINALYSIS, ROUTINE W REFLEX MICROSCOPIC
Bacteria, UA: NONE SEEN
Bilirubin Urine: NEGATIVE
GLUCOSE, UA: 50 mg/dL — AB
HGB URINE DIPSTICK: NEGATIVE
Ketones, ur: 5 mg/dL — AB
Leukocytes, UA: NEGATIVE
NITRITE: NEGATIVE
PH: 5 (ref 5.0–8.0)
PROTEIN: 100 mg/dL — AB
RBC / HPF: NONE SEEN RBC/hpf (ref 0–5)
Specific Gravity, Urine: 1.032 — ABNORMAL HIGH (ref 1.005–1.030)

## 2016-04-07 LAB — CBC
HCT: 40.7 % (ref 36.0–46.0)
Hemoglobin: 13.8 g/dL (ref 12.0–15.0)
MCH: 27.2 pg (ref 26.0–34.0)
MCHC: 33.9 g/dL (ref 30.0–36.0)
MCV: 80.3 fL (ref 78.0–100.0)
PLATELETS: 367 10*3/uL (ref 150–400)
RBC: 5.07 MIL/uL (ref 3.87–5.11)
RDW: 14.7 % (ref 11.5–15.5)
WBC: 13.6 10*3/uL — ABNORMAL HIGH (ref 4.0–10.5)

## 2016-04-07 LAB — LIPASE, BLOOD: LIPASE: 22 U/L (ref 11–51)

## 2016-04-07 MED ORDER — GI COCKTAIL ~~LOC~~
30.0000 mL | Freq: Once | ORAL | Status: AC
  Administered 2016-04-07: 30 mL via ORAL
  Filled 2016-04-07: qty 30

## 2016-04-07 MED ORDER — ONDANSETRON HCL 4 MG/2ML IJ SOLN
4.0000 mg | Freq: Once | INTRAMUSCULAR | Status: AC | PRN
  Administered 2016-04-07: 4 mg via INTRAVENOUS
  Filled 2016-04-07: qty 2

## 2016-04-07 MED ORDER — PANTOPRAZOLE SODIUM 40 MG IV SOLR
40.0000 mg | Freq: Once | INTRAVENOUS | Status: AC
  Administered 2016-04-07: 40 mg via INTRAVENOUS
  Filled 2016-04-07: qty 40

## 2016-04-07 MED ORDER — ONDANSETRON HCL 4 MG/2ML IJ SOLN
4.0000 mg | Freq: Once | INTRAMUSCULAR | Status: AC
  Administered 2016-04-07: 4 mg via INTRAVENOUS
  Filled 2016-04-07: qty 2

## 2016-04-07 NOTE — ED Notes (Signed)
Pt requested that we come take the IV out of her arm, states she is gonna "get out of here". RN made aware.

## 2016-04-07 NOTE — ED Triage Notes (Signed)
Per pt has chronic pancreatitis and has been hurting with nausea vomiting x 6 days.  Phenergan suppositories nor ODT zofran are working at home. Pt feels dehydrated with 10/10 pain.
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
## 2016-04-07 NOTE — ED Provider Notes (Addendum)
I was asked by staff to address the patient's concerns.  Patient asked RN to remove her IV, is standing fully clothed and states that she has persistent abdominal pain, nausea and vomiting, and "feel worse than when I came.".  I asked if she would be willing to have this replaced the IV, and give additional treatments with fluids and antiemetics.  She stated "I am okay, and want to go home."  She has Zofran to use for nausea at home.   Daleen Bo, MD 04/07/16 [place], MD 04/07/16 1004

## 2016-04-09 ENCOUNTER — Emergency Department (HOSPITAL_COMMUNITY)
Admission: EM | Admit: 2016-04-09 | Discharge: 2016-04-09 | Disposition: A | Discharge status: Home / Self Care | Payer: Medicaid Other | Attending: Emergency Medicine | Admitting: Emergency Medicine

## 2016-04-09 ENCOUNTER — Encounter (HOSPITAL_COMMUNITY): Payer: Self-pay | Admitting: Emergency Medicine

## 2016-04-09 DIAGNOSIS — Z79899 Other long term (current) drug therapy: Secondary | ICD-10-CM | POA: Insufficient documentation

## 2016-04-09 DIAGNOSIS — F17210 Nicotine dependence, cigarettes, uncomplicated: Secondary | ICD-10-CM | POA: Insufficient documentation

## 2016-04-09 DIAGNOSIS — I10 Essential (primary) hypertension: Secondary | ICD-10-CM | POA: Diagnosis not present

## 2016-04-09 DIAGNOSIS — K219 Gastro-esophageal reflux disease without esophagitis: Secondary | ICD-10-CM | POA: Insufficient documentation

## 2016-04-09 DIAGNOSIS — J45909 Unspecified asthma, uncomplicated: Secondary | ICD-10-CM | POA: Diagnosis not present

## 2016-04-09 DIAGNOSIS — R1030 Lower abdominal pain, unspecified: Secondary | ICD-10-CM | POA: Insufficient documentation

## 2016-04-09 DIAGNOSIS — R109 Unspecified abdominal pain: Secondary | ICD-10-CM

## 2016-04-09 DIAGNOSIS — G8929 Other chronic pain: Secondary | ICD-10-CM | POA: Diagnosis not present

## 2016-04-09 LAB — RAPID URINE DRUG SCREEN, HOSP PERFORMED
Amphetamines: NOT DETECTED
Barbiturates: POSITIVE — AB
Benzodiazepines: NOT DETECTED
COCAINE: NOT DETECTED
OPIATES: NOT DETECTED
Tetrahydrocannabinol: NOT DETECTED

## 2016-04-09 LAB — URINALYSIS, ROUTINE W REFLEX MICROSCOPIC
BILIRUBIN URINE: NEGATIVE
Glucose, UA: NEGATIVE mg/dL
KETONES UR: NEGATIVE mg/dL
Nitrite: NEGATIVE
PROTEIN: 100 mg/dL — AB
Specific Gravity, Urine: 1.015 (ref 1.005–1.030)
pH: 7 (ref 5.0–8.0)

## 2016-04-09 LAB — COMPREHENSIVE METABOLIC PANEL
ALBUMIN: 4.1 g/dL (ref 3.5–5.0)
ALT: 64 U/L — ABNORMAL HIGH (ref 14–54)
ANION GAP: 15 (ref 5–15)
AST: 62 U/L — ABNORMAL HIGH (ref 15–41)
Alkaline Phosphatase: 94 U/L (ref 38–126)
BILIRUBIN TOTAL: 1.3 mg/dL — AB (ref 0.3–1.2)
BUN: 10 mg/dL (ref 6–20)
CO2: 30 mmol/L (ref 22–32)
Calcium: 9.6 mg/dL (ref 8.9–10.3)
Chloride: 91 mmol/L — ABNORMAL LOW (ref 101–111)
Creatinine, Ser: 1.02 mg/dL — ABNORMAL HIGH (ref 0.44–1.00)
GFR calc Af Amer: >60 mL/min (ref >60)
GFR calc non Af Amer: >60 mL/min (ref >60)
GLUCOSE: 180 mg/dL — AB (ref 65–99)
Potassium: 3.5 mmol/L (ref 3.5–5.1)
Sodium: 136 mmol/L (ref 135–145)
TOTAL PROTEIN: 8 g/dL (ref 6.5–8.1)

## 2016-04-09 LAB — CBC
HEMATOCRIT: 44.4 % (ref 36.0–46.0)
HEMOGLOBIN: 15 g/dL (ref 12.0–15.0)
MCH: 27.2 pg (ref 26.0–34.0)
MCHC: 33.8 g/dL (ref 30.0–36.0)
MCV: 80.6 fL (ref 78.0–100.0)
Platelets: 406 10*3/uL — ABNORMAL HIGH (ref 150–400)
RBC: 5.51 MIL/uL — ABNORMAL HIGH (ref 3.87–5.11)
RDW: 14.8 % (ref 11.5–15.5)
WBC: 10.7 10*3/uL — ABNORMAL HIGH (ref 4.0–10.5)

## 2016-04-09 LAB — LIPASE, BLOOD: Lipase: 15 U/L (ref 11–51)

## 2016-04-09 MED ORDER — PROMETHAZINE HCL 25 MG RE SUPP: 25.0000 mg | Freq: Four times a day (QID) | RECTAL | 0 refills | Status: DC | PRN

## 2016-04-09 MED ORDER — GI COCKTAIL ~~LOC~~
30.0000 mL | Freq: Once | ORAL | Status: AC
  Administered 2016-04-09: 30 mL via ORAL
  Filled 2016-04-09: qty 30

## 2016-04-09 MED ORDER — SODIUM CHLORIDE 0.9 % IV BOLUS (SEPSIS)
1000.0000 mL | Freq: Once | INTRAVENOUS | Status: AC
  Administered 2016-04-09: 1000 mL via INTRAVENOUS

## 2016-04-09 MED ORDER — DICYCLOMINE HCL 10 MG/ML IM SOLN
20.0000 mg | Freq: Once | INTRAMUSCULAR | Status: AC
  Administered 2016-04-09: 20 mg via INTRAMUSCULAR
  Filled 2016-04-09: qty 2

## 2016-04-09 MED ORDER — PROMETHAZINE HCL 25 MG/ML IJ SOLN
25.0000 mg | Freq: Once | INTRAMUSCULAR | Status: AC
  Administered 2016-04-09: 25 mg via INTRAMUSCULAR
  Filled 2016-04-09: qty 1

## 2016-04-09 MED ORDER — FAMOTIDINE IN NACL 20-0.9 MG/50ML-% IV SOLN
20.0000 mg | Freq: Once | INTRAVENOUS | Status: AC
  Administered 2016-04-09: 20 mg via INTRAVENOUS
  Filled 2016-04-09: qty 50
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
## 2016-04-09 NOTE — Discharge Instructions (Signed)
Drink plenty of fluids. Use the phenergan suppositories for nausea or vomiting. Continue your protonix. You can try TUMS for acid reflux symptoms between doses of your protonix.  Follow up with either Dr Nori Riis or Dr Benson Norway about your pain if not improving this week.

## 2016-04-09 NOTE — ED Notes (Signed)
Family at bedside. 

## 2016-04-09 NOTE — ED Notes (Signed)
Pt states pain greatly improved.  MD notified.

## 2016-04-09 NOTE — ED Notes (Signed)
Called pharmacy and spoke with Jeneen Rinks. He stated you can mix bentyl and promethazine in same syringe

## 2016-04-09 NOTE — ED Triage Notes (Signed)
Pt presents to ed for abdominal pain seen here for same 2 days ago. States she has chronic pancreatitis and has not been able to eat or drink for 7 days now. Pt looks in acute distress during triage

## 2016-04-10 LAB — URINE CULTURE

## 2016-04-11 ENCOUNTER — Other Ambulatory Visit: Payer: Self-pay | Admitting: Family Medicine

## 2016-04-26 ENCOUNTER — Other Ambulatory Visit: Payer: Self-pay | Admitting: Family Medicine

## 2016-04-26 ENCOUNTER — Ambulatory Visit: Payer: Medicaid Other | Admitting: Family Medicine

## 2016-05-10 ENCOUNTER — Other Ambulatory Visit: Payer: Self-pay | Admitting: Family Medicine

## 2016-05-10 MED ORDER — TINIDAZOLE 500 MG PO TABS: 2.0000 g | ORAL_TABLET | Freq: Once | ORAL | 1 refills | Status: DC

## 2016-05-10 NOTE — Telephone Encounter (Signed)
Spoke to pt. Informed her of the info below. Pt had good understanding.  [name], CMA
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
## 2016-05-10 NOTE — Telephone Encounter (Signed)
LVM for pt to call the office. If pt calls, please give her the info below. [name], CMA'
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
## 2016-05-18 ENCOUNTER — Other Ambulatory Visit: Payer: Self-pay | Admitting: Family Medicine

## 2016-06-08 ENCOUNTER — Encounter (HOSPITAL_COMMUNITY): Payer: Self-pay | Admitting: Nurse Practitioner

## 2016-06-08 ENCOUNTER — Emergency Department (HOSPITAL_COMMUNITY): Payer: Medicaid Other

## 2016-06-08 ENCOUNTER — Emergency Department (HOSPITAL_COMMUNITY)
Admission: EM | Admit: 2016-06-08 | Discharge: 2016-06-08 | Disposition: A | Discharge status: Home / Self Care | Payer: Medicaid Other | Attending: Emergency Medicine | Admitting: Emergency Medicine

## 2016-06-08 DIAGNOSIS — R197 Diarrhea, unspecified: Secondary | ICD-10-CM | POA: Insufficient documentation

## 2016-06-08 DIAGNOSIS — R1013 Epigastric pain: Secondary | ICD-10-CM | POA: Diagnosis not present

## 2016-06-08 DIAGNOSIS — I10 Essential (primary) hypertension: Secondary | ICD-10-CM | POA: Insufficient documentation

## 2016-06-08 DIAGNOSIS — J45909 Unspecified asthma, uncomplicated: Secondary | ICD-10-CM | POA: Diagnosis not present

## 2016-06-08 DIAGNOSIS — Z79899 Other long term (current) drug therapy: Secondary | ICD-10-CM | POA: Insufficient documentation

## 2016-06-08 DIAGNOSIS — F17210 Nicotine dependence, cigarettes, uncomplicated: Secondary | ICD-10-CM | POA: Insufficient documentation

## 2016-06-08 DIAGNOSIS — R112 Nausea with vomiting, unspecified: Secondary | ICD-10-CM | POA: Diagnosis present

## 2016-06-08 LAB — CBC WITH DIFFERENTIAL/PLATELET
Basophils Absolute: 0 10*3/uL (ref 0.0–0.1)
Basophils Relative: 0 %
EOS ABS: 0 10*3/uL (ref 0.0–0.7)
EOS PCT: 0 %
HCT: 41.1 % (ref 36.0–46.0)
Hemoglobin: 14.4 g/dL (ref 12.0–15.0)
LYMPHS ABS: 2 10*3/uL (ref 0.7–4.0)
Lymphocytes Relative: 23 %
MCH: 27.4 pg (ref 26.0–34.0)
MCHC: 35 g/dL (ref 30.0–36.0)
MCV: 78.1 fL (ref 78.0–100.0)
MONOS PCT: 3 %
Monocytes Absolute: 0.3 10*3/uL (ref 0.1–1.0)
Neutro Abs: 6.6 10*3/uL (ref 1.7–7.7)
Neutrophils Relative %: 74 %
PLATELETS: 341 10*3/uL (ref 150–400)
RBC: 5.26 MIL/uL — ABNORMAL HIGH (ref 3.87–5.11)
RDW: 14.4 % (ref 11.5–15.5)
WBC: 8.9 10*3/uL (ref 4.0–10.5)

## 2016-06-08 LAB — URINALYSIS, ROUTINE W REFLEX MICROSCOPIC
Bilirubin Urine: NEGATIVE
GLUCOSE, UA: NEGATIVE mg/dL
HGB URINE DIPSTICK: NEGATIVE
KETONES UR: 20 mg/dL — AB
Leukocytes, UA: NEGATIVE
Nitrite: NEGATIVE
PROTEIN: NEGATIVE mg/dL
Specific Gravity, Urine: 1.025 (ref 1.005–1.030)
pH: 8 (ref 5.0–8.0)

## 2016-06-08 LAB — COMPREHENSIVE METABOLIC PANEL
ALT: 24 U/L (ref 14–54)
AST: 26 U/L (ref 15–41)
Albumin: 4.6 g/dL (ref 3.5–5.0)
Alkaline Phosphatase: 92 U/L (ref 38–126)
Anion gap: 11 (ref 5–15)
BUN: 8 mg/dL (ref 6–20)
CHLORIDE: 101 mmol/L (ref 101–111)
CO2: 24 mmol/L (ref 22–32)
CREATININE: 0.92 mg/dL (ref 0.44–1.00)
Calcium: 9.9 mg/dL (ref 8.9–10.3)
GFR calc Af Amer: >60 mL/min (ref >60)
Glucose, Bld: 157 mg/dL — ABNORMAL HIGH (ref 65–99)
Potassium: 4.3 mmol/L (ref 3.5–5.1)
SODIUM: 136 mmol/L (ref 135–145)
Total Bilirubin: 0.8 mg/dL (ref 0.3–1.2)
Total Protein: 9.1 g/dL — ABNORMAL HIGH (ref 6.5–8.1)

## 2016-06-08 LAB — LIPASE, BLOOD: LIPASE: 31 U/L (ref 11–51)

## 2016-06-08 LAB — HCG, QUANTITATIVE, PREGNANCY: hCG, Beta Chain, Quant, S: <1 m[IU]/mL (ref <5)

## 2016-06-08 MED ORDER — IOPAMIDOL (ISOVUE-300) INJECTION 61%
INTRAVENOUS | Status: AC
  Filled 2016-06-08: qty 100

## 2016-06-08 MED ORDER — SODIUM CHLORIDE 0.9 % IV BOLUS (SEPSIS)
1000.0000 mL | Freq: Once | INTRAVENOUS | Status: AC
  Administered 2016-06-08: 1000 mL via INTRAVENOUS

## 2016-06-08 MED ORDER — PANTOPRAZOLE SODIUM 40 MG IV SOLR
40.0000 mg | Freq: Once | INTRAVENOUS | Status: AC
  Administered 2016-06-08: 40 mg via INTRAVENOUS
  Filled 2016-06-08: qty 40

## 2016-06-08 MED ORDER — MORPHINE SULFATE (PF) 2 MG/ML IV SOLN
4.0000 mg | Freq: Once | INTRAVENOUS | Status: AC
  Administered 2016-06-08: 4 mg via INTRAVENOUS
  Filled 2016-06-08: qty 2

## 2016-06-08 MED ORDER — FAMOTIDINE IN NACL 20-0.9 MG/50ML-% IV SOLN
20.0000 mg | Freq: Once | INTRAVENOUS | Status: AC
  Administered 2016-06-08: 20 mg via INTRAVENOUS
  Filled 2016-06-08: qty 50

## 2016-06-08 MED ORDER — IOPAMIDOL (ISOVUE-300) INJECTION 61%
100.0000 mL | Freq: Once | INTRAVENOUS | Status: AC | PRN
  Administered 2016-06-08: 100 mL via INTRAVENOUS

## 2016-06-08 MED ORDER — ONDANSETRON HCL 4 MG/2ML IJ SOLN
4.0000 mg | Freq: Once | INTRAMUSCULAR | Status: AC
  Administered 2016-06-08: 4 mg via INTRAVENOUS
  Filled 2016-06-08: qty 2

## 2016-06-08 MED ORDER — HALOPERIDOL LACTATE 5 MG/ML IJ SOLN
2.0000 mg | Freq: Once | INTRAMUSCULAR | Status: AC
  Administered 2016-06-08: 2 mg via INTRAVENOUS
  Filled 2016-06-08: qty 1

## 2016-06-08 MED ORDER — PROMETHAZINE HCL 25 MG PO TABS: 25.0000 mg | ORAL_TABLET | Freq: Four times a day (QID) | ORAL | 0 refills | Status: DC | PRN
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
## 2016-06-08 NOTE — Discharge Instructions (Signed)
All of your labs and imaging has been reassuring in the ED. She continue your Pepcid and Prilosec at home.  Continue short course of Phenergan for nausea and pain. Make sure you call your GI doctor today to schedule follow-up appointment. Return to the ED if your unable to keep anything down by mouth have worsening pain or for any reason.

## 2016-06-08 NOTE — ED Provider Notes (Signed)
Medical screening examination/treatment/procedure(s) were conducted as a shared visit with non-physician practitioner(s) and myself.  I personally evaluated the patient during the encounter. Briefly, the patient is a 41 y.o. female who presents to the ED with exacerbation of her cervical and vomiting with associated abdominal pain. Labs grossly reassuring without evidence of pancreatitis, biliary obstruction. CT obtained which did not reveal any evidence of serious intra-abdominal/inflammatory. Patient treated symptomatically and able to tolerate by mouth. Safe for discharge with strict return precautions.    EKG Interpretation  Date/Time:  Thursday Jun 08 2016 14:23:33 EDT Ventricular Rate:  83 PR Interval:    QRS Duration: 83 QT Interval:  377 QTC Calculation: 443 R Axis:   49 Text Interpretation:  Sinus rhythm Abnormal R-wave progression, early transition Otherwise no significant change Reconfirmed by [facility] MD, Gabrelle Roca ([id number]) on 06/08/2016 5:10:10 PM           Gregor Dershem, Grayce Sessions, MD 06/08/16 1710

## 2016-06-08 NOTE — ED Notes (Signed)
Called lab to check on hcg serum. They will add on.

## 2016-06-08 NOTE — ED Triage Notes (Signed)
Patient presents to [facility] via [facility] from home for complaints of generalized abdominal pain that started yesterday. She says she had one episode of diarrhea last night and has noticed bright red blood in her emesis. No diarrhea or emesis with EMS. Patient rates pain 10/10. Iv startyed by EMS, zofran 4mg  administered in route. Patient has hx of pancreatitis.

## 2016-06-13 ENCOUNTER — Other Ambulatory Visit: Payer: Self-pay | Admitting: Family Medicine

## 2016-06-14 ENCOUNTER — Encounter: Payer: Self-pay | Admitting: Student

## 2016-06-14 ENCOUNTER — Ambulatory Visit (INDEPENDENT_AMBULATORY_CARE_PROVIDER_SITE_OTHER): Payer: Medicaid Other | Admitting: Student

## 2016-06-14 VITALS — BP 110/72 | HR 106 | Temp 98.4°F | Wt 223.0 lb

## 2016-06-14 DIAGNOSIS — K449 Diaphragmatic hernia without obstruction or gangrene: Secondary | ICD-10-CM

## 2016-06-14 DIAGNOSIS — R1084 Generalized abdominal pain: Secondary | ICD-10-CM | POA: Diagnosis not present

## 2016-06-14 NOTE — Patient Instructions (Signed)
Follow up as needed A referral was made to GI and you will be called regarding your symptoms  Call the office at [phone] with questions or concerns

## 2016-06-14 NOTE — Progress Notes (Signed)
   Subjective:    Patient ID: Holly Mendez, female    DOB: [birth year], 41 y.o.   MRN: [id number]   CC: stomach pain  HPI: 41 y/o F presents for stomach pain and concern for hernia  Stomach pain - seen in the ED for this on 5/31 and had an overall unremarkable workup - CT abd pelvis did show a small hiatal hernia and the pateint is very concerned about this - she states that every time she eats or drinks, it touches the hernia and she has significant pain - she has had emesis, last this AM, normal stools - she spoke to her GI Doctor who told her there was nothing to do for her hernia and it was not concerning and the patient wishes to get a second opinion from another GI physician  Smoking status reviewed  Review of Systems  Per HPI, else denies recent illness, fever, chest pain, shortness of breath,     Objective:  BP 110/72   Pulse (!) 106   Temp 98.4 F (36.9 C) (Oral)   Wt 223 lb (101.2 kg)   SpO2 97%   BMI 40.79 kg/m  Vitals and nursing note reviewed  General: NAD Cardiac: RRR,  Respiratory: CTAB, normal effort Abdomen: soft, initially nontender but when asked if palpations hurt her she jumped and recoiled from pain, nondistended, no hepatic or splenomegaly. Bowel sounds present, no palpable abdominal hernia Extremities: no edema or cyanosis. WWP. Skin: warm and dry, no rashes noted Neuro: alert and oriented, no focal deficits   Assessment & Plan:    Generalized abdominal pain Continued generalized abdominal pain which appears to be worse now that she has been told she has a small hiatal hernia. No concerning findings on exam. Will refer to GI again to be seen by a different provider. Hydration precautions discussed. She has been on carafate and protonix in past which did not seem to be helpful, so unclear if restarting would be helpful. Will follow as needed with GI    Broox Lonigro A. Lincoln Brigham MD, [facility] Resident PGY-3 Pager [phone]

## 2016-06-14 NOTE — Assessment & Plan Note (Addendum)
Continued generalized abdominal pain which appears to be worse now that she has been told she has a small hiatal hernia. No concerning findings on exam. Will refer to GI again to be seen by a different provider. Hydration precautions discussed. She has been on carafate and protonix in past which did not seem to be helpful, so unclear if restarting would be helpful. Will follow as needed with GI

## 2016-06-16 ENCOUNTER — Telehealth: Payer: Self-pay | Admitting: Gastroenterology

## 2016-06-16 NOTE — Telephone Encounter (Signed)
Received GI records. Patient states that she is not satisfied with [facility] and says that "Dr. Benson Norway does not want to help her with her medical issues. Dr. Loletha Carrow is Doc of the Day for 06/14/16. Records placed on his desk for review.

## 2016-06-20 NOTE — Telephone Encounter (Signed)
I reviewed Dr. Ulyses Amor records. It appears she has had a workup for constipation pancreatitis, and earlier this year he referred her to a pain clinic.  I do not feel I have anything further to add, and I politely decline to accept transfer.  - HD

## 2016-06-21 NOTE — Telephone Encounter (Signed)
Informed patient of this.  °

## 2016-06-23 ENCOUNTER — Telehealth: Payer: Self-pay | Admitting: Family Medicine

## 2016-06-23 NOTE — Telephone Encounter (Signed)
Spoke to pt. Appt made for pt to see Dr. Nori Riis on 08/02/2016. [name], CMA

## 2016-06-23 NOTE — Telephone Encounter (Signed)
Pt called because she was referred to [facility]. The doctor from there said that would not except her because she didn't think he could do anything for her the Dr. Benson Norway had did. The patient would like to have another referral to a different GI doctor. jw

## 2016-06-23 NOTE — Telephone Encounter (Signed)
Dear [name] Team Ok--it is unlikely that the other GI groups will take her either if she has had full work up by Dr. Benson Norway. See if he will schedule an appointment with me in next month or so and we can discuss. I do not know exactly what else to recommend either THANKS! Holly Mendez

## 2016-06-29 ENCOUNTER — Emergency Department (HOSPITAL_COMMUNITY)
Admission: EM | Admit: 2016-06-29 | Discharge: 2016-06-29 | Disposition: A | Discharge status: Home / Self Care | Payer: Medicaid Other | Attending: Emergency Medicine | Admitting: Emergency Medicine

## 2016-06-29 ENCOUNTER — Encounter (HOSPITAL_COMMUNITY): Payer: Self-pay

## 2016-06-29 ENCOUNTER — Emergency Department (HOSPITAL_COMMUNITY): Payer: Medicaid Other

## 2016-06-29 DIAGNOSIS — R109 Unspecified abdominal pain: Secondary | ICD-10-CM

## 2016-06-29 DIAGNOSIS — I10 Essential (primary) hypertension: Secondary | ICD-10-CM | POA: Diagnosis not present

## 2016-06-29 DIAGNOSIS — R112 Nausea with vomiting, unspecified: Secondary | ICD-10-CM | POA: Insufficient documentation

## 2016-06-29 DIAGNOSIS — F17210 Nicotine dependence, cigarettes, uncomplicated: Secondary | ICD-10-CM | POA: Diagnosis not present

## 2016-06-29 DIAGNOSIS — Z79899 Other long term (current) drug therapy: Secondary | ICD-10-CM | POA: Insufficient documentation

## 2016-06-29 DIAGNOSIS — J45909 Unspecified asthma, uncomplicated: Secondary | ICD-10-CM | POA: Diagnosis not present

## 2016-06-29 DIAGNOSIS — R1013 Epigastric pain: Secondary | ICD-10-CM | POA: Insufficient documentation

## 2016-06-29 DIAGNOSIS — G8929 Other chronic pain: Secondary | ICD-10-CM | POA: Insufficient documentation

## 2016-06-29 LAB — COMPREHENSIVE METABOLIC PANEL
ALBUMIN: 4.3 g/dL (ref 3.5–5.0)
ALT: 17 U/L (ref 14–54)
AST: 20 U/L (ref 15–41)
Alkaline Phosphatase: 82 U/L (ref 38–126)
Anion gap: 9 (ref 5–15)
BUN: 9 mg/dL (ref 6–20)
CALCIUM: 9.4 mg/dL (ref 8.9–10.3)
CHLORIDE: 103 mmol/L (ref 101–111)
CO2: 26 mmol/L (ref 22–32)
CREATININE: 0.8 mg/dL (ref 0.44–1.00)
GFR calc non Af Amer: >60 mL/min (ref >60)
GLUCOSE: 139 mg/dL — AB (ref 65–99)
Potassium: 3.4 mmol/L — ABNORMAL LOW (ref 3.5–5.1)
SODIUM: 138 mmol/L (ref 135–145)
Total Bilirubin: 0.9 mg/dL (ref 0.3–1.2)
Total Protein: 8.2 g/dL — ABNORMAL HIGH (ref 6.5–8.1)

## 2016-06-29 LAB — I-STAT BETA HCG BLOOD, ED (MC, WL, AP ONLY): I-stat hCG, quantitative: <5 m[IU]/mL (ref <5)

## 2016-06-29 LAB — CBC WITH DIFFERENTIAL/PLATELET
Basophils Absolute: 0 10*3/uL (ref 0.0–0.1)
Basophils Relative: 0 %
Eosinophils Absolute: 0 10*3/uL (ref 0.0–0.7)
Eosinophils Relative: 0 %
HEMATOCRIT: 39.4 % (ref 36.0–46.0)
Hemoglobin: 13.4 g/dL (ref 12.0–15.0)
LYMPHS ABS: 1.1 10*3/uL (ref 0.7–4.0)
LYMPHS PCT: 13 %
MCH: 27.3 pg (ref 26.0–34.0)
MCHC: 34 g/dL (ref 30.0–36.0)
MCV: 80.2 fL (ref 78.0–100.0)
Monocytes Absolute: 0.3 10*3/uL (ref 0.1–1.0)
Monocytes Relative: 3 %
NEUTROS ABS: 7.3 10*3/uL (ref 1.7–7.7)
Neutrophils Relative %: 84 %
Platelets: 316 10*3/uL (ref 150–400)
RBC: 4.91 MIL/uL (ref 3.87–5.11)
RDW: 15 % (ref 11.5–15.5)
WBC: 8.7 10*3/uL (ref 4.0–10.5)

## 2016-06-29 LAB — URINALYSIS, ROUTINE W REFLEX MICROSCOPIC
BILIRUBIN URINE: NEGATIVE
GLUCOSE, UA: NEGATIVE mg/dL
Hgb urine dipstick: NEGATIVE
Ketones, ur: 20 mg/dL — AB
Leukocytes, UA: NEGATIVE
NITRITE: NEGATIVE
PH: 9 — AB (ref 5.0–8.0)
Protein, ur: NEGATIVE mg/dL
SPECIFIC GRAVITY, URINE: 1.015 (ref 1.005–1.030)

## 2016-06-29 LAB — LIPASE, BLOOD: LIPASE: 47 U/L (ref 11–51)

## 2016-06-29 MED ORDER — HALOPERIDOL LACTATE 5 MG/ML IJ SOLN
2.0000 mg | Freq: Once | INTRAMUSCULAR | Status: DC
  Filled 2016-06-29: qty 1

## 2016-06-29 MED ORDER — PROMETHAZINE HCL 25 MG/ML IJ SOLN
12.5000 mg | Freq: Once | INTRAMUSCULAR | Status: AC
  Administered 2016-06-29: 12.5 mg via INTRAVENOUS
  Filled 2016-06-29: qty 1

## 2016-06-29 MED ORDER — POTASSIUM CHLORIDE CRYS ER 20 MEQ PO TBCR
40.0000 meq | EXTENDED_RELEASE_TABLET | Freq: Once | ORAL | Status: AC
  Administered 2016-06-29: 40 meq via ORAL
  Filled 2016-06-29: qty 2

## 2016-06-29 MED ORDER — HALOPERIDOL LACTATE 5 MG/ML IJ SOLN
2.0000 mg | Freq: Once | INTRAMUSCULAR | Status: AC
  Administered 2016-06-29: 2 mg via INTRAMUSCULAR
  Filled 2016-06-29: qty 1

## 2016-06-29 MED ORDER — PROMETHAZINE HCL 25 MG PO TABS: 25.0000 mg | ORAL_TABLET | Freq: Four times a day (QID) | ORAL | 0 refills | Status: DC | PRN

## 2016-06-29 MED ORDER — SODIUM CHLORIDE 0.9 % IV BOLUS (SEPSIS)
1000.0000 mL | Freq: Once | INTRAVENOUS | Status: AC
  Administered 2016-06-29: 1000 mL via INTRAVENOUS

## 2016-06-29 MED ORDER — DICYCLOMINE HCL 20 MG PO TABS: 20.0000 mg | ORAL_TABLET | Freq: Two times a day (BID) | ORAL | 0 refills | Status: DC

## 2016-06-29 MED ORDER — FAMOTIDINE IN NACL 20-0.9 MG/50ML-% IV SOLN
20.0000 mg | Freq: Once | INTRAVENOUS | Status: AC
  Administered 2016-06-29: 20 mg via INTRAVENOUS
  Filled 2016-06-29: qty 50

## 2016-06-29 NOTE — ED Provider Notes (Signed)
Received care of pt at [place] from Dr. Thurnell Garbe.  Labs show no leukocytosis, no sign of pancreatitis, no transaminitis. Pregnancy test negative.  Pt with nausea, after phenergan. Given haldol. Sleeping throughout much of ED stay. Has not had additional emesis. Is tolerating po. Likely exacerbation of chronic abdominal pain and emesis. Given rx for zofran, bentyl.    Gareth Morgan, MD 06/30/16 1419

## 2016-06-29 NOTE — ED Notes (Signed)
Bed: XK55 Expected date:  Expected time:  Means of arrival:  Comments: TR 2

## 2016-06-29 NOTE — ED Triage Notes (Signed)
Pt BIB [facility] from home c/o upper abdominal pain and N/V x2 days. She reports that it is a burning pain and feels similar to her pain with pancreatitis. Pt is refusing to walk and is yelling into the hall. She is also dry-heaving in triage. A&Ox4. Ambulatory.
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
## 2016-07-13 ENCOUNTER — Emergency Department (HOSPITAL_COMMUNITY): Payer: Medicaid Other

## 2016-07-13 ENCOUNTER — Emergency Department (HOSPITAL_COMMUNITY)
Admission: EM | Admit: 2016-07-13 | Discharge: 2016-07-13 | Disposition: A | Discharge status: Home / Self Care | Payer: Medicaid Other | Attending: Emergency Medicine | Admitting: Emergency Medicine

## 2016-07-13 ENCOUNTER — Encounter (HOSPITAL_COMMUNITY): Payer: Self-pay | Admitting: Emergency Medicine

## 2016-07-13 DIAGNOSIS — Z79899 Other long term (current) drug therapy: Secondary | ICD-10-CM | POA: Insufficient documentation

## 2016-07-13 DIAGNOSIS — R112 Nausea with vomiting, unspecified: Secondary | ICD-10-CM | POA: Diagnosis not present

## 2016-07-13 DIAGNOSIS — F17210 Nicotine dependence, cigarettes, uncomplicated: Secondary | ICD-10-CM | POA: Insufficient documentation

## 2016-07-13 DIAGNOSIS — R1084 Generalized abdominal pain: Secondary | ICD-10-CM | POA: Diagnosis present

## 2016-07-13 DIAGNOSIS — J45909 Unspecified asthma, uncomplicated: Secondary | ICD-10-CM | POA: Insufficient documentation

## 2016-07-13 DIAGNOSIS — I10 Essential (primary) hypertension: Secondary | ICD-10-CM | POA: Diagnosis not present

## 2016-07-13 LAB — I-STAT BETA HCG BLOOD, ED (MC, WL, AP ONLY): I-stat hCG, quantitative: <5 m[IU]/mL (ref <5)

## 2016-07-13 LAB — COMPREHENSIVE METABOLIC PANEL
ALBUMIN: 4 g/dL (ref 3.5–5.0)
ALT: 19 U/L (ref 14–54)
AST: 24 U/L (ref 15–41)
Alkaline Phosphatase: 68 U/L (ref 38–126)
Anion gap: 12 (ref 5–15)
BILIRUBIN TOTAL: 0.5 mg/dL (ref 0.3–1.2)
BUN: 18 mg/dL (ref 6–20)
CHLORIDE: 101 mmol/L (ref 101–111)
CO2: 22 mmol/L (ref 22–32)
Calcium: 9.5 mg/dL (ref 8.9–10.3)
Creatinine, Ser: 1.53 mg/dL — ABNORMAL HIGH (ref 0.44–1.00)
GFR calc Af Amer: 47 mL/min — ABNORMAL LOW (ref >60)
GFR calc non Af Amer: 41 mL/min — ABNORMAL LOW (ref >60)
GLUCOSE: 174 mg/dL — AB (ref 65–99)
POTASSIUM: 3.3 mmol/L — AB (ref 3.5–5.1)
Sodium: 135 mmol/L (ref 135–145)
Total Protein: 7.3 g/dL (ref 6.5–8.1)

## 2016-07-13 LAB — CBC WITH DIFFERENTIAL/PLATELET
BASOS PCT: 0 %
Basophils Absolute: 0 10*3/uL (ref 0.0–0.1)
EOS ABS: 0 10*3/uL (ref 0.0–0.7)
Eosinophils Relative: 0 %
HCT: 40 % (ref 36.0–46.0)
HEMOGLOBIN: 13.6 g/dL (ref 12.0–15.0)
LYMPHS ABS: 2.7 10*3/uL (ref 0.7–4.0)
Lymphocytes Relative: 29 %
MCH: 27.3 pg (ref 26.0–34.0)
MCHC: 34 g/dL (ref 30.0–36.0)
MCV: 80.3 fL (ref 78.0–100.0)
Monocytes Absolute: 0.5 10*3/uL (ref 0.1–1.0)
Monocytes Relative: 5 %
NEUTROS PCT: 66 %
Neutro Abs: 6 10*3/uL (ref 1.7–7.7)
Platelets: 360 10*3/uL (ref 150–400)
RBC: 4.98 MIL/uL (ref 3.87–5.11)
RDW: 15.5 % (ref 11.5–15.5)
WBC: 9.2 10*3/uL (ref 4.0–10.5)

## 2016-07-13 LAB — LIPASE, BLOOD: Lipase: 23 U/L (ref 11–51)

## 2016-07-13 LAB — I-STAT TROPONIN, ED: TROPONIN I, POC: 0 ng/mL (ref 0.00–0.08)

## 2016-07-13 MED ORDER — METOCLOPRAMIDE HCL 5 MG/ML IJ SOLN
10.0000 mg | Freq: Once | INTRAMUSCULAR | Status: AC
  Administered 2016-07-13: 10 mg via INTRAVENOUS
  Filled 2016-07-13: qty 2

## 2016-07-13 MED ORDER — METOCLOPRAMIDE HCL 10 MG PO TABS: 10.0000 mg | ORAL_TABLET | Freq: Two times a day (BID) | ORAL | 0 refills | Status: DC | PRN

## 2016-07-13 MED ORDER — LORAZEPAM 2 MG/ML IJ SOLN
1.0000 mg | Freq: Once | INTRAMUSCULAR | Status: AC
  Administered 2016-07-13: 1 mg via INTRAVENOUS
  Filled 2016-07-13: qty 1

## 2016-07-13 MED ORDER — SODIUM CHLORIDE 0.9 % IV BOLUS (SEPSIS)
500.0000 mL | Freq: Once | INTRAVENOUS | Status: AC
  Administered 2016-07-13: 500 mL via INTRAVENOUS
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
## 2016-07-13 NOTE — ED Triage Notes (Signed)
Per PTAR:  Pt presents to ED for assessment after being diagnosed with acute pancreatitis on Tuesday.  States she has not been able to keep any of her home meds down, c/o increase in pain.

## 2016-07-13 NOTE — ED Notes (Signed)
Pt reports that she feels "much better" and would like to go home.  Provider notified

## 2016-07-14 ENCOUNTER — Other Ambulatory Visit: Payer: Self-pay | Admitting: Family Medicine

## 2016-07-14 ENCOUNTER — Telehealth: Payer: Self-pay | Admitting: Family Medicine

## 2016-07-14 NOTE — Telephone Encounter (Signed)
Pt called asking about being seen by dr neal earlier than her scheduled. She has been to the ED twice this week.  She has seen dr hung in the past for these issues and asked to see another GI.  She was referred to [facility] and the practice didn't take her case.  Please call pt and answer her questions about a hernia.

## 2016-07-14 NOTE — Telephone Encounter (Signed)
Discussed with Dr. Nori Riis; patient does not need to be called at this.  Patient need to keep her appointment with Dr. Nori Riis.  Derl Barrow, RN

## 2016-07-19 NOTE — Telephone Encounter (Signed)
Heather from [facility] calls, she has been contact with the patient and states that patient really would like to be referred to another GI MD. Nira Conn also states that patient has made appt with Behavioral [facility] to help with her mental help issues. I advised Nira Conn that the best thing at this point was to have pt see Dr. Nori Riis so they can discuss pt's options. We can always refer her to another GI but it will be outside of [place], as pt has seen [facility], Dr. Benson Norway and declined at [place]. Any questions for Nira Conn, please call her at [phone].

## 2016-08-02 ENCOUNTER — Ambulatory Visit (INDEPENDENT_AMBULATORY_CARE_PROVIDER_SITE_OTHER): Payer: Medicaid Other | Admitting: Family Medicine

## 2016-08-02 ENCOUNTER — Encounter: Payer: Self-pay | Admitting: Family Medicine

## 2016-08-02 VITALS — BP 104/80 | HR 104 | Temp 98.7°F | Ht 65.0 in | Wt 215.6 lb

## 2016-08-02 DIAGNOSIS — R1013 Epigastric pain: Secondary | ICD-10-CM | POA: Diagnosis present

## 2016-08-02 DIAGNOSIS — R1084 Generalized abdominal pain: Secondary | ICD-10-CM | POA: Diagnosis not present

## 2016-08-02 NOTE — Patient Instructions (Signed)
Take the ariprazole in AM and the sertraline at night. Hold off on the tegretol right now. Take the sucralfate IF ithelps you stomach Eat crackers, applesauce, instant breakfast, gatorade mixed with water---SMALL amounts at a time. Try taking your pills about an hour after you have eaten a small amount  Let me see you next week

## 2016-08-03 LAB — COMPREHENSIVE METABOLIC PANEL
ALBUMIN: 4.5 g/dL (ref 3.5–5.5)
ALK PHOS: 95 IU/L (ref 39–117)
ALT: 49 IU/L — ABNORMAL HIGH (ref 0–32)
AST: 24 IU/L (ref 0–40)
Albumin/Globulin Ratio: 1.5 (ref 1.2–2.2)
BILIRUBIN TOTAL: 0.5 mg/dL (ref 0.0–1.2)
BUN / CREAT RATIO: 10 (ref 9–23)
BUN: 11 mg/dL (ref 6–24)
CHLORIDE: 93 mmol/L — AB (ref 96–106)
CO2: 29 mmol/L (ref 20–29)
Calcium: 10.3 mg/dL — ABNORMAL HIGH (ref 8.7–10.2)
Creatinine, Ser: 1.1 mg/dL — ABNORMAL HIGH (ref 0.57–1.00)
GFR calc Af Amer: 72 mL/min/{1.73_m2} (ref >59)
GFR calc non Af Amer: 62 mL/min/{1.73_m2} (ref >59)
GLOBULIN, TOTAL: 3 g/dL (ref 1.5–4.5)
Glucose: 125 mg/dL — ABNORMAL HIGH (ref 65–99)
POTASSIUM: 3.5 mmol/L (ref 3.5–5.2)
SODIUM: 139 mmol/L (ref 134–144)
Total Protein: 7.5 g/dL (ref 6.0–8.5)

## 2016-08-03 LAB — CBC
Hematocrit: 39.9 % (ref 34.0–46.6)
Hemoglobin: 13.5 g/dL (ref 11.1–15.9)
MCH: 27.5 pg (ref 26.6–33.0)
MCHC: 33.8 g/dL (ref 31.5–35.7)
MCV: 81 fL (ref 79–97)
PLATELETS: 370 10*3/uL (ref 150–379)
RBC: 4.91 x10E6/uL (ref 3.77–5.28)
RDW: 16 % — AB (ref 12.3–15.4)
WBC: 10.1 10*3/uL (ref 3.4–10.8)

## 2016-08-03 LAB — LIPASE: LIPASE: 21 U/L (ref 14–72)

## 2016-08-04 ENCOUNTER — Encounter (HOSPITAL_COMMUNITY): Payer: Self-pay | Admitting: Emergency Medicine

## 2016-08-04 ENCOUNTER — Emergency Department (HOSPITAL_COMMUNITY): Payer: Medicaid Other

## 2016-08-04 DIAGNOSIS — J45909 Unspecified asthma, uncomplicated: Secondary | ICD-10-CM | POA: Diagnosis not present

## 2016-08-04 DIAGNOSIS — R112 Nausea with vomiting, unspecified: Secondary | ICD-10-CM | POA: Diagnosis not present

## 2016-08-04 DIAGNOSIS — I10 Essential (primary) hypertension: Secondary | ICD-10-CM | POA: Diagnosis not present

## 2016-08-04 DIAGNOSIS — R1013 Epigastric pain: Secondary | ICD-10-CM | POA: Diagnosis present

## 2016-08-04 DIAGNOSIS — F259 Schizoaffective disorder, unspecified: Secondary | ICD-10-CM | POA: Insufficient documentation

## 2016-08-04 DIAGNOSIS — R748 Abnormal levels of other serum enzymes: Secondary | ICD-10-CM | POA: Insufficient documentation

## 2016-08-04 DIAGNOSIS — F17210 Nicotine dependence, cigarettes, uncomplicated: Secondary | ICD-10-CM | POA: Insufficient documentation

## 2016-08-04 DIAGNOSIS — Z79899 Other long term (current) drug therapy: Secondary | ICD-10-CM | POA: Insufficient documentation

## 2016-08-04 DIAGNOSIS — F319 Bipolar disorder, unspecified: Secondary | ICD-10-CM | POA: Insufficient documentation

## 2016-08-04 DIAGNOSIS — K861 Other chronic pancreatitis: Secondary | ICD-10-CM | POA: Diagnosis not present

## 2016-08-04 NOTE — ED Triage Notes (Signed)
Pt to ED via [facility]> c/o upper abd pain and epigastric pain x 2 weeks with nausea and vomiting.  History of pancreatitis.

## 2016-08-04 NOTE — Progress Notes (Signed)
    CHIEF COMPLAINT / HPI:   abdominal pain continues.  Over the last 2 weeks she's a.  She is also having emesis of even clear liquids over the last 2-3 days.   No blood in her emesis.  Abdominal pain is midepigastric.  No blood in her stool.  No exces  Does feel bloated however. #2.  Depression: Was seen at the mental [facility]  She is having trouble keeping them down  Denies any suicidal or homicidal ideat  Feels like her life is under a lot of s  She and her husband are separated but she  She is also now worried that she h some health issue that we have as yet discovered.  Specifically, she is worried she has pancreatic cancer or similar.  REVIEW OF SYSTEMS:  As per HPI with additional ROS: positive unintentional weight loss.  Denies chest pain.  Denies shortness of breath.  Denies headache.  Denies fever or chills.  OBJECTIVE:  Vital signs are reviewed.  Vital signs reviewed. GENERAL: Well-developed, well-nourished, no acute distress. CARDIOVASCULAR: Regular rate and rhythm no murmur gallop or rub LUNGS: Clear to auscultation bilaterally, no rales or wheeze. ABDOMEN: Soft positive bowel sounds. Diffusely TTP midepigastric area. No rebound or guarding. NEURO: No gross focal neurological deficits. MSK: Movement of extremity x 4.    ASSESSMENT / PLAN: Please see problem oriented charting for details

## 2016-08-04 NOTE — Assessment & Plan Note (Signed)
I reviewed all of her gastrointestinal workup with her including the procedure she had done.  The only abnormality was the pancreas had some mild architectural changes but nothing specific.  Do think her abdominal distress is worsened by he current depression.  Had long discussion sspending > than 50%  25 minute office visit in counseling a  She's been slurred on quite a few medi in an effort to improve her abdomi  We decided to simplify that down to her Risperdal and her sertraline putting a hold on the Tegretol for r.  We'll continue her MRSA growth plat.  We discussed bland diet, very small amounts of food, stress reduction.  I'll see her back in one week.

## 2016-08-05 ENCOUNTER — Emergency Department (HOSPITAL_COMMUNITY)
Admission: EM | Admit: 2016-08-05 | Discharge: 2016-08-05 | Disposition: A | Discharge status: Home / Self Care | Payer: Medicaid Other | Attending: Emergency Medicine | Admitting: Emergency Medicine

## 2016-08-05 ENCOUNTER — Emergency Department (HOSPITAL_COMMUNITY): Payer: Medicaid Other

## 2016-08-05 DIAGNOSIS — R112 Nausea with vomiting, unspecified: Secondary | ICD-10-CM

## 2016-08-05 DIAGNOSIS — R194 Change in bowel habit: Secondary | ICD-10-CM

## 2016-08-05 DIAGNOSIS — K861 Other chronic pancreatitis: Secondary | ICD-10-CM

## 2016-08-05 DIAGNOSIS — R748 Abnormal levels of other serum enzymes: Secondary | ICD-10-CM

## 2016-08-05 LAB — COMPREHENSIVE METABOLIC PANEL
ALBUMIN: 4.4 g/dL (ref 3.5–5.0)
ALK PHOS: 79 U/L (ref 38–126)
ALT: 46 U/L (ref 14–54)
ANION GAP: 12 (ref 5–15)
AST: 35 U/L (ref 15–41)
BUN: 11 mg/dL (ref 6–20)
CHLORIDE: 94 mmol/L — AB (ref 101–111)
CO2: 30 mmol/L (ref 22–32)
Calcium: 9.6 mg/dL (ref 8.9–10.3)
Creatinine, Ser: 1.18 mg/dL — ABNORMAL HIGH (ref 0.44–1.00)
GFR calc non Af Amer: 56 mL/min — ABNORMAL LOW (ref >60)
Glucose, Bld: 164 mg/dL — ABNORMAL HIGH (ref 65–99)
POTASSIUM: 3 mmol/L — AB (ref 3.5–5.1)
SODIUM: 136 mmol/L (ref 135–145)
Total Bilirubin: 0.7 mg/dL (ref 0.3–1.2)
Total Protein: 7.9 g/dL (ref 6.5–8.1)

## 2016-08-05 LAB — CBC
HEMATOCRIT: 40.1 % (ref 36.0–46.0)
HEMOGLOBIN: 13.4 g/dL (ref 12.0–15.0)
MCH: 26.7 pg (ref 26.0–34.0)
MCHC: 33.4 g/dL (ref 30.0–36.0)
MCV: 80 fL (ref 78.0–100.0)
Platelets: 362 10*3/uL (ref 150–400)
RBC: 5.01 MIL/uL (ref 3.87–5.11)
RDW: 15.1 % (ref 11.5–15.5)
WBC: 9.1 10*3/uL (ref 4.0–10.5)

## 2016-08-05 LAB — URINALYSIS, ROUTINE W REFLEX MICROSCOPIC
BILIRUBIN URINE: NEGATIVE
GLUCOSE, UA: NEGATIVE mg/dL
Hgb urine dipstick: NEGATIVE
KETONES UR: 20 mg/dL — AB
LEUKOCYTES UA: NEGATIVE
NITRITE: NEGATIVE
PH: 5 (ref 5.0–8.0)
Protein, ur: 100 mg/dL — AB
SPECIFIC GRAVITY, URINE: 1.032 — AB (ref 1.005–1.030)

## 2016-08-05 LAB — I-STAT TROPONIN, ED: Troponin i, poc: 0 ng/mL (ref 0.00–0.08)

## 2016-08-05 LAB — LIPASE, BLOOD: LIPASE: 320 U/L — AB (ref 11–51)

## 2016-08-05 MED ORDER — METOCLOPRAMIDE HCL 5 MG/ML IJ SOLN
10.0000 mg | Freq: Once | INTRAMUSCULAR | Status: AC
  Administered 2016-08-05: 10 mg via INTRAVENOUS
  Filled 2016-08-05: qty 2

## 2016-08-05 MED ORDER — SODIUM CHLORIDE 0.9 % IV BOLUS (SEPSIS)
1000.0000 mL | Freq: Once | INTRAVENOUS | Status: AC
  Administered 2016-08-05: 1000 mL via INTRAVENOUS

## 2016-08-05 MED ORDER — LORAZEPAM 2 MG/ML IJ SOLN
1.0000 mg | Freq: Once | INTRAMUSCULAR | Status: AC
  Administered 2016-08-05: 1 mg via INTRAVENOUS
  Filled 2016-08-05: qty 1

## 2016-08-05 MED ORDER — HYDROCODONE-ACETAMINOPHEN 5-325 MG PO TABS: 1.0000 | ORAL_TABLET | Freq: Four times a day (QID) | ORAL | 0 refills | Status: DC | PRN

## 2016-08-05 MED ORDER — ONDANSETRON 4 MG PO TBDP: ORAL_TABLET | ORAL | 0 refills | Status: DC

## 2016-08-05 NOTE — ED Notes (Signed)
This RN found pt in triage bathroom sitting floor, pt had pulled call light and her "knees buckled" Asked pt why she would walk to bathroom by herself when she knows she is a fall risk... Pt has yellow armband and socks in place. Pt states she did not want to bother anyone. Denies hitting head or passing out when falling. Denies any pain r/t fall.

## 2016-08-05 NOTE — ED Notes (Signed)
Pt removed monitor, MD notified

## 2016-08-05 NOTE — Discharge Instructions (Signed)
1. Medications: zofran, vicodin, usual home medications 2. Treatment: rest, drink plenty of fluids, advance diet slowly 3. Follow Up: Please followup with your primary doctor in 2 days for discussion of your diagnoses and further evaluation after today's visit; if you do not have a primary care doctor use the resource guide provided to find one; Please return to the ER for persistent vomiting, high fevers or worsening symptoms  
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
## 2016-08-05 NOTE — ED Notes (Signed)
Pt stable, ambulatory, states understanding of discharge instructions 

## 2016-08-08 ENCOUNTER — Encounter: Payer: Self-pay | Admitting: Family Medicine

## 2016-08-09 ENCOUNTER — Ambulatory Visit: Payer: Medicaid Other | Admitting: Family Medicine

## 2016-08-16 ENCOUNTER — Other Ambulatory Visit: Payer: Self-pay | Admitting: Family Medicine

## 2016-08-16 DIAGNOSIS — Z1231 Encounter for screening mammogram for malignant neoplasm of breast: Secondary | ICD-10-CM

## 2016-08-23 ENCOUNTER — Other Ambulatory Visit: Payer: Self-pay

## 2016-08-23 ENCOUNTER — Encounter: Payer: Self-pay | Admitting: Family Medicine

## 2016-08-23 ENCOUNTER — Ambulatory Visit (INDEPENDENT_AMBULATORY_CARE_PROVIDER_SITE_OTHER): Payer: Medicaid Other | Admitting: Family Medicine

## 2016-08-23 ENCOUNTER — Emergency Department (HOSPITAL_COMMUNITY)
Admission: EM | Admit: 2016-08-23 | Discharge: 2016-08-23 | Disposition: A | Discharge status: Home / Self Care | Payer: Medicaid Other | Attending: Emergency Medicine | Admitting: Emergency Medicine

## 2016-08-23 DIAGNOSIS — R1012 Left upper quadrant pain: Secondary | ICD-10-CM | POA: Diagnosis present

## 2016-08-23 DIAGNOSIS — F17210 Nicotine dependence, cigarettes, uncomplicated: Secondary | ICD-10-CM | POA: Diagnosis not present

## 2016-08-23 DIAGNOSIS — J45909 Unspecified asthma, uncomplicated: Secondary | ICD-10-CM | POA: Diagnosis not present

## 2016-08-23 DIAGNOSIS — I10 Essential (primary) hypertension: Secondary | ICD-10-CM | POA: Diagnosis not present

## 2016-08-23 DIAGNOSIS — F3170 Bipolar disorder, currently in remission, most recent episode unspecified: Secondary | ICD-10-CM | POA: Diagnosis not present

## 2016-08-23 DIAGNOSIS — Z79899 Other long term (current) drug therapy: Secondary | ICD-10-CM | POA: Diagnosis not present

## 2016-08-23 DIAGNOSIS — R112 Nausea with vomiting, unspecified: Secondary | ICD-10-CM | POA: Insufficient documentation

## 2016-08-23 DIAGNOSIS — R739 Hyperglycemia, unspecified: Secondary | ICD-10-CM | POA: Diagnosis not present

## 2016-08-23 DIAGNOSIS — R509 Fever, unspecified: Secondary | ICD-10-CM | POA: Insufficient documentation

## 2016-08-23 DIAGNOSIS — R350 Frequency of micturition: Secondary | ICD-10-CM | POA: Diagnosis not present

## 2016-08-23 DIAGNOSIS — R12 Heartburn: Secondary | ICD-10-CM | POA: Diagnosis not present

## 2016-08-23 LAB — COMPREHENSIVE METABOLIC PANEL
ALBUMIN: 4.2 g/dL (ref 3.5–5.0)
ALK PHOS: 62 U/L (ref 38–126)
ALT: 34 U/L (ref 14–54)
AST: 26 U/L (ref 15–41)
Anion gap: 12 (ref 5–15)
BUN: 6 mg/dL (ref 6–20)
CALCIUM: 9.4 mg/dL (ref 8.9–10.3)
CO2: 27 mmol/L (ref 22–32)
CREATININE: 0.83 mg/dL (ref 0.44–1.00)
Chloride: 99 mmol/L — ABNORMAL LOW (ref 101–111)
GFR calc Af Amer: >60 mL/min (ref >60)
GFR calc non Af Amer: >60 mL/min (ref >60)
GLUCOSE: 117 mg/dL — AB (ref 65–99)
Potassium: 2.6 mmol/L — CL (ref 3.5–5.1)
SODIUM: 138 mmol/L (ref 135–145)
Total Bilirubin: 0.8 mg/dL (ref 0.3–1.2)
Total Protein: 7.6 g/dL (ref 6.5–8.1)

## 2016-08-23 LAB — CBC WITH DIFFERENTIAL/PLATELET
BASOS ABS: 0 10*3/uL (ref 0.0–0.1)
BASOS PCT: 0 %
EOS ABS: 0 10*3/uL (ref 0.0–0.7)
Eosinophils Relative: 0 %
HEMATOCRIT: 38.4 % (ref 36.0–46.0)
Hemoglobin: 13.5 g/dL (ref 12.0–15.0)
Lymphocytes Relative: 45 %
Lymphs Abs: 3.9 10*3/uL (ref 0.7–4.0)
MCH: 27.6 pg (ref 26.0–34.0)
MCHC: 35.2 g/dL (ref 30.0–36.0)
MCV: 78.5 fL (ref 78.0–100.0)
MONO ABS: 0.9 10*3/uL (ref 0.1–1.0)
Monocytes Relative: 11 %
NEUTROS ABS: 3.8 10*3/uL (ref 1.7–7.7)
NEUTROS PCT: 44 %
Platelets: 270 10*3/uL (ref 150–400)
RBC: 4.89 MIL/uL (ref 3.87–5.11)
RDW: 14.7 % (ref 11.5–15.5)
WBC: 8.6 10*3/uL (ref 4.0–10.5)

## 2016-08-23 LAB — URINALYSIS, ROUTINE W REFLEX MICROSCOPIC
BILIRUBIN URINE: NEGATIVE
GLUCOSE, UA: NEGATIVE mg/dL
Hgb urine dipstick: NEGATIVE
KETONES UR: NEGATIVE mg/dL
Leukocytes, UA: NEGATIVE
Nitrite: NEGATIVE
PH: 8 (ref 5.0–8.0)
Protein, ur: NEGATIVE mg/dL
SPECIFIC GRAVITY, URINE: 1.005 (ref 1.005–1.030)

## 2016-08-23 LAB — RAPID URINE DRUG SCREEN, HOSP PERFORMED
AMPHETAMINES: NOT DETECTED
BARBITURATES: NOT DETECTED
Benzodiazepines: NOT DETECTED
COCAINE: NOT DETECTED
Opiates: NOT DETECTED
TETRAHYDROCANNABINOL: NOT DETECTED

## 2016-08-23 LAB — LIPASE, BLOOD: Lipase: 23 U/L (ref 11–51)

## 2016-08-23 LAB — ETHANOL: Alcohol, Ethyl (B): <5 mg/dL (ref <5)

## 2016-08-23 LAB — GLUCOSE, POCT (MANUAL RESULT ENTRY): POC Glucose: 120 mg/dl — AB (ref 70–99)

## 2016-08-23 MED ORDER — POTASSIUM CHLORIDE CRYS ER 20 MEQ PO TBCR
40.0000 meq | EXTENDED_RELEASE_TABLET | Freq: Once | ORAL | Status: AC
  Administered 2016-08-23: 40 meq via ORAL
  Filled 2016-08-23: qty 2

## 2016-08-23 MED ORDER — METOCLOPRAMIDE HCL 5 MG/ML IJ SOLN
10.0000 mg | Freq: Once | INTRAMUSCULAR | Status: AC
Start: 2016-08-23 — End: 2016-08-23
  Administered 2016-08-23: 10 mg via INTRAVENOUS
  Filled 2016-08-23: qty 2

## 2016-08-23 MED ORDER — GI COCKTAIL ~~LOC~~
30.0000 mL | Freq: Once | ORAL | Status: AC
  Administered 2016-08-23: 30 mL via ORAL
  Filled 2016-08-23: qty 30

## 2016-08-23 MED ORDER — POTASSIUM CHLORIDE 10 MEQ/100ML IV SOLN
10.0000 meq | Freq: Once | INTRAVENOUS | Status: AC
  Administered 2016-08-23: 10 meq via INTRAVENOUS
  Filled 2016-08-23: qty 100

## 2016-08-23 MED ORDER — DIPHENHYDRAMINE HCL 50 MG/ML IJ SOLN
25.0000 mg | Freq: Once | INTRAMUSCULAR | Status: AC
  Administered 2016-08-23: 25 mg via INTRAVENOUS
  Filled 2016-08-23: qty 1

## 2016-08-23 MED ORDER — HALOPERIDOL LACTATE 5 MG/ML IJ SOLN
2.0000 mg | Freq: Once | INTRAMUSCULAR | Status: AC
  Administered 2016-08-23: 2 mg via INTRAVENOUS
  Filled 2016-08-23: qty 1

## 2016-08-23 MED ORDER — SUCRALFATE 1 G PO TABS: ORAL_TABLET | ORAL | 0 refills | Status: DC

## 2016-08-23 MED ORDER — SODIUM CHLORIDE 0.9 % IV BOLUS (SEPSIS)
1000.0000 mL | Freq: Once | INTRAVENOUS | Status: AC
  Administered 2016-08-23: 1000 mL via INTRAVENOUS

## 2016-08-23 MED ORDER — ONDANSETRON 4 MG PO TBDP: ORAL_TABLET | ORAL | 0 refills | Status: DC

## 2016-08-23 NOTE — ED Notes (Signed)
Pt poor historian as she is whispering some answers and appears to be sleeping during others. Vague description of reason related to visit. S/O at bedside.

## 2016-08-23 NOTE — ED Notes (Signed)
In and out cath attempted with no success, external catheter has been placed and pt is aware that a urine sample is needed.

## 2016-08-23 NOTE — Progress Notes (Signed)
See documentation of same date

## 2016-08-23 NOTE — Discharge Instructions (Signed)
You have been evaluated for your abdominal pain.  You do not have pancreatitis at this time.  Take Zofran as needed for nausea.  Take Carafate for heart burn.  Follow up with your doctor for further care.

## 2016-08-23 NOTE — ED Triage Notes (Signed)
Patient is brought in by EMS from a dr appointment. Hx of pancreatitis pt has severe abd pain has not ate or drank in 15 days. Patient has tried ODT Zofran at home but refused for EMS. Patient is dry heaving. Dr. Reported Pseudo seizures due to being upset about wait time. Patient has been off her acid reflux medication.

## 2016-08-23 NOTE — ED Notes (Signed)
Bed: UG64 Expected date:  Expected time:  Means of arrival:  Comments: EMS/abd. Pain/hx pancreatitis

## 2016-08-23 NOTE — Progress Notes (Signed)
Patient scheduled for follow up visit this AM. In lobby, she was noted to be in distress with a lot of "shaking and trembling". I have been caring for her for about 6 months and I suspect social stressors and her underlying psychiatric issues are paramount
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
## 2016-08-29 ENCOUNTER — Telehealth: Payer: Self-pay | Admitting: *Deleted

## 2016-08-29 NOTE — Telephone Encounter (Signed)
Patient called requesting to speak with PCP.  Advised patient that PCP is not in office at this. Patient reported she has not had food in 21 days, can't keep anything down and lost weight.  Patient wanted to be referred to GI specialist at [facility].  Patient has been seen 6 times in the ED in the last month. Per patient they just give her medication for n/v and send her mom. Patient stated will I will just a walk in to clinic tomorrow. Advised patient that she needed to schedule an appointment because this type of visit is not a walk-in or same day visit.  Appointment scheduled with Dr. Andria Frames 08/30/16 at 2:50 PM.  Derl Barrow, RN

## 2016-08-30 ENCOUNTER — Ambulatory Visit: Payer: Medicaid Other | Admitting: Family Medicine

## 2016-09-06 ENCOUNTER — Ambulatory Visit (INDEPENDENT_AMBULATORY_CARE_PROVIDER_SITE_OTHER): Payer: Medicaid Other | Admitting: Family Medicine

## 2016-09-06 ENCOUNTER — Encounter: Payer: Self-pay | Admitting: Family Medicine

## 2016-09-06 VITALS — BP 118/72 | HR 76 | Temp 98.6°F | Ht 65.0 in | Wt 205.8 lb

## 2016-09-06 DIAGNOSIS — R1084 Generalized abdominal pain: Secondary | ICD-10-CM | POA: Diagnosis present

## 2016-09-06 MED ORDER — AMITRIPTYLINE HCL 25 MG PO TABS: ORAL_TABLET | ORAL | 1 refills | Status: DC

## 2016-09-06 MED ORDER — ONDANSETRON 4 MG PO TBDP: ORAL_TABLET | ORAL | 3 refills | Status: DC

## 2016-09-06 NOTE — Patient Instructions (Signed)
See me back in 2 months. We have discussed the medicines that he should be on today. Please call the interim if you have any new or worsening symptoms. I am sending a referral in for a second opinion gastroenterologist.

## 2016-09-07 NOTE — Progress Notes (Signed)
    CHIEF COMPLAINT / HPI:  Continued problems with nausea and vomiting. Ondansetron helps some. She has restarted her psychiatric meds and feels better that they are "staying down". BM without blood and  They remain fairly regular. Occasional diarrhea. Lots of stress. Denies SI/HI. No hallucinations. Sleep is OK. Appetite down secondary to nausea.   REVIEW OF SYSTEMS:  See HPI  OBJECTIVE:    Vital signs reviewed. GENERAL: Well-developed, well-nourished, no acute distress. CARDIOVASCULAR: Regular rate and rhythm no murmur gallop or rub LUNGS: Clear to auscultation bilaterally, no rales or wheeze. ABDOMEN: Soft positive bowel sounds NEURO: No gross focal neurological deficits. MSK: Movement of extremity x 4. PSYCH: GOVP0. Interactive affect. Calm. No agitation.ecent and remote memory intact    ASSESSMENT / PLAN: Please see problem oriented charting for details

## 2016-09-07 NOTE — Assessment & Plan Note (Signed)
I reviewed her GI workup to date. Will refer her at her request for second opinion  As the last time they did not agree to see her. I have warned her  That the new referral may end up n same place. Greater than 50% of our 25 minute office visit was spent in counseling and education regarding her chronic abdominal pain,nausea and vomiting. I think there is a huge componenet of stress involved in her problems. Glad she is back on her psychiatric meds. Recommended she continue in counseling. She plans to become more active doing thkngs she likes with family memebers as a way to relieve stress. F/u 8 weeks.

## 2016-09-13 ENCOUNTER — Ambulatory Visit: Payer: Medicaid Other

## 2016-10-02 ENCOUNTER — Other Ambulatory Visit: Payer: Self-pay | Admitting: Family Medicine

## 2016-10-13 ENCOUNTER — Telehealth: Payer: Self-pay | Admitting: *Deleted

## 2016-10-13 NOTE — Telephone Encounter (Signed)
Pharmacy calling on behalf of patient, patient requesting new rx for nexium 40mg  cap.

## 2016-10-16 MED ORDER — ESOMEPRAZOLE MAGNESIUM 40 MG PO PACK: 40.0000 mg | PACK | Freq: Every day | ORAL | 3 refills | Status: DC

## 2016-11-01 ENCOUNTER — Telehealth: Payer: Self-pay | Admitting: Family Medicine

## 2016-11-01 NOTE — Telephone Encounter (Signed)
One of sex partners has trich.  She would like something called in for this. Also needs refill for generic zyrtec.  [facility].

## 2016-11-03 MED ORDER — TINIDAZOLE 500 MG PO TABS: 2.0000 g | ORAL_TABLET | Freq: Once | ORAL | 3 refills | Status: DC

## 2016-11-03 NOTE — Telephone Encounter (Signed)
Dear [name] Team OK ALL of her sex partners need to be treated. I will send in a rx for her. She MUST follow up with me in next few weeks for a check for cure. We should also screen her for other STIs at tat time . THANKS! [name]

## 2016-11-06 ENCOUNTER — Ambulatory Visit: Payer: Medicaid Other

## 2016-11-06 NOTE — Telephone Encounter (Signed)
Contacted pt to give her the below message and she asked about the allergy medicine (generic for zyrtec per previous message)being called in as well.  When I looked in her current meds I did not see it on there so I told her I would send another message to PCP. Katharina Caper, April D, [place]

## 2016-11-07 NOTE — Telephone Encounter (Signed)
I have sent her Zyrtec in

## 2016-11-27 ENCOUNTER — Other Ambulatory Visit: Payer: Self-pay | Admitting: Family Medicine

## 2016-11-28 ENCOUNTER — Telehealth: Payer: Self-pay | Admitting: *Deleted

## 2016-11-28 NOTE — Telephone Encounter (Signed)
Tinidazole requiring prior authorization, Prior auth form along with formulary placed in PCP box.

## 2016-12-01 ENCOUNTER — Other Ambulatory Visit: Payer: Self-pay | Admitting: Family Medicine

## 2016-12-04 ENCOUNTER — Other Ambulatory Visit: Payer: Self-pay | Admitting: Family Medicine

## 2016-12-04 NOTE — Telephone Encounter (Signed)
Done Holly Mendez  

## 2016-12-05 ENCOUNTER — Ambulatory Visit: Payer: Medicaid Other

## 2016-12-07 ENCOUNTER — Encounter: Payer: Self-pay | Admitting: Family Medicine

## 2016-12-07 NOTE — Telephone Encounter (Signed)
Completed PA info in [place] for tinidazole.  Status pending.  Will recheck status in 24 hours. Vivian Neuwirth, Salome Spotted, CMA

## 2016-12-07 NOTE — Telephone Encounter (Signed)
Unable to locate original.  Will have provider complete again.  Received fax from [place] requesting prior authorization of Tinidazole .  Form placed in MD's box for completion along with Medicaid formulary.  Puanani Gene, Salome Spotted, CMA

## 2016-12-07 NOTE — Progress Notes (Signed)
http://hess-atkins.info/ Pubmed [id number]

## 2016-12-19 ENCOUNTER — Other Ambulatory Visit: Payer: Self-pay | Admitting: Family Medicine

## 2016-12-20 ENCOUNTER — Encounter: Payer: Medicaid Other | Admitting: Family Medicine

## 2017-01-03 ENCOUNTER — Encounter: Payer: Self-pay | Admitting: Family Medicine

## 2017-01-03 ENCOUNTER — Other Ambulatory Visit: Payer: Self-pay

## 2017-01-03 ENCOUNTER — Ambulatory Visit (INDEPENDENT_AMBULATORY_CARE_PROVIDER_SITE_OTHER): Payer: Medicaid Other | Admitting: Family Medicine

## 2017-01-03 ENCOUNTER — Other Ambulatory Visit: Payer: Self-pay | Admitting: Family Medicine

## 2017-01-03 VITALS — BP 126/84 | HR 75 | Temp 97.7°F | Ht 65.0 in | Wt 242.8 lb

## 2017-01-03 DIAGNOSIS — R599 Enlarged lymph nodes, unspecified: Secondary | ICD-10-CM

## 2017-01-03 DIAGNOSIS — Z23 Encounter for immunization: Secondary | ICD-10-CM | POA: Diagnosis not present

## 2017-01-03 MED ORDER — CETIRIZINE HCL 10 MG PO TABS: 10.0000 mg | ORAL_TABLET | Freq: Every day | ORAL | 3 refills | Status: DC

## 2017-01-03 MED ORDER — FLUTICASONE PROPIONATE 50 MCG/ACT NA SUSP: 2.0000 | Freq: Every day | NASAL | 12 refills | Status: DC | PRN

## 2017-01-03 NOTE — Patient Instructions (Signed)
I have called in refills for your Flonase and cetirizine.  I think the little mass in your neck is a normal lymph node.  I would like to see you back in 4-6 weeks and we can do your Pap smear then.  I will also check into getting you in with a different pain clinic.  We need to update your tetanus shot when I see you back as well.  Have a happy new year!

## 2017-01-04 NOTE — Progress Notes (Signed)
    CHIEF COMPLAINT / HPI: Originally scheduled for Pap smear but wants to postpone that since she just started her period this morning. 2.  Noticed a lump in the right side of her neck.  Has had some upper respiratory stuffiness and nasal drainage.  Ran out of her allergy medicine.  Mild sore throat.  No cough.  REVIEW OF SYSTEMS: No shortness of breath, no chest pain.  PERTINENT  PMH / PSH: I have reviewed the patient's medications, allergies, past medical and surgical history, smoking status and updated in the EMR as appropriate.   OBJECTIVE: GENERAL: Well-developed female no acute distress HEENT: Oropharynx has some mild erythema but no exudate.  Neck is supple.  There is some shotty lymphadenopathy in the right anterior cervical chain.  No thyromegaly.  No JVD.  TMs bilaterally mildly retracted but good landmarks. LUNGS: Clear to auscultation bilaterally CV: Regular rate and rhythm  ASSESSMENT / PLAN: Small reactive lymph node probably in relation to either her allergic symptoms or upper respiratory infection.  I will refill her allergy medicines.  She will reschedule for her Pap smear.

## 2017-01-05 ENCOUNTER — Ambulatory Visit: Payer: Medicaid Other

## 2017-01-05 ENCOUNTER — Telehealth: Payer: Self-pay | Admitting: *Deleted

## 2017-01-05 ENCOUNTER — Other Ambulatory Visit: Payer: Self-pay | Admitting: Family Medicine

## 2017-01-05 DIAGNOSIS — M549 Dorsalgia, unspecified: Principal | ICD-10-CM

## 2017-01-05 DIAGNOSIS — G8929 Other chronic pain: Secondary | ICD-10-CM

## 2017-01-05 NOTE — Telephone Encounter (Signed)
Patient's current pain management office has moved to [place], [place] and patient does not have transportation.  Dr. Nori Riis requesting round trip transportation assistance to pain management office on Monday (01/08/17).  Per Velma at [facility] - trip will be $28 each way due to office is located in [place] (total $56).  Will accommodate one-time request to give Dr. Nori Riis time to find new pain mgmt office located in [place].  Faxed round trip vouchers to [phone] (attn: Velma).    [place] Voucher:  Previous assistance?  no  Taxi, Bus pass, gas card, food, or other? taxi Med co-pay? no         [place]?  no Other:  no  Provider:  [facility] Staff:  Burna Forts, BSN, RN-BC

## 2017-01-15 ENCOUNTER — Telehealth: Payer: Self-pay

## 2017-01-15 NOTE — Telephone Encounter (Signed)
error 

## 2017-01-15 NOTE — Telephone Encounter (Signed)
Patient called stating that she got a referral to be seen by our office. She wants to know what she has to do to set up an appointment so that she can come in.

## 2017-01-19 ENCOUNTER — Telehealth: Payer: Self-pay | Admitting: *Deleted

## 2017-01-19 ENCOUNTER — Other Ambulatory Visit: Payer: Self-pay | Admitting: Family Medicine

## 2017-01-19 NOTE — Telephone Encounter (Signed)
Medication Prior Josem Kaufmann was sent from pharmacy on tinidazole.  Per [facility] was denied on 12/07/16.  We have not refill since.  Will forward to provider to see what next steps should be. Layonna Dobie, Salome Spotted, CMA

## 2017-01-20 MED ORDER — METRONIDAZOLE 500 MG PO TABS: ORAL_TABLET | ORAL | 1 refills | Status: DC

## 2017-01-20 NOTE — Telephone Encounter (Signed)
Will send in flagyl instead [name]

## 2017-01-29 ENCOUNTER — Other Ambulatory Visit: Payer: Self-pay | Admitting: Family Medicine

## 2017-01-30 ENCOUNTER — Telehealth: Payer: Self-pay | Admitting: Family Medicine

## 2017-01-30 MED ORDER — AMITRIPTYLINE HCL 25 MG PO TABS: ORAL_TABLET | ORAL | 1 refills | Status: DC

## 2017-01-30 NOTE — Telephone Encounter (Signed)
Dear [name] Team Lets increase her amitriptyline to 50 mg at nigh from 25 mg. I will call in new rx THANKS! [name]

## 2017-01-30 NOTE — Telephone Encounter (Signed)
Pt called again wanting to know if there is anything at all that can be called in for her pain while she waits to get in the new pain clinic. She says it doesn't have to be a narcotic.

## 2017-01-30 NOTE — Telephone Encounter (Signed)
Pt said she called the new pain clinic [facility] referred her to and they said it would be another 4 weeks before they make a decision on whether to take her or not. Pt wants to know what she should do for pain in the meantime. Please give patient a call to discuss this.

## 2017-01-31 NOTE — Telephone Encounter (Signed)
Dear [name] Team [place] I had forgotten that. Can you please call HER and ask if she was taking 25 or 75---I am not sure she ever tapered up. If she is at 98, please let pharmacy know to proceed with new rx. If she is at higher dose, please send back to me. THANKS! [name]

## 2017-01-31 NOTE — Telephone Encounter (Signed)
LVM for pt to call the office. If she calls, please give her the information below. Sharon T Saunders, CMA  

## 2017-01-31 NOTE — Telephone Encounter (Signed)
Received call from Lennette Bihari (pharmacist) at [facility] asking for clarification on Elavil prescription sent 01/30/17. Previous rx for Elavil 25 mg had sig of 1-3 qhs and patient has been taking 75 mg. New rx is 25 mg take 2 qhs which is not an increase.  Please advise.

## 2017-02-07 NOTE — Telephone Encounter (Signed)
Spoke to pt. She is taking 25 mg up to 3 times a day. She has only been taking 2 a day. Pharmacy isn't sure what should be decreased. The mg's or how many she should take a day. Please advise. [name], CMA

## 2017-02-08 NOTE — Telephone Encounter (Signed)
Pt returning [name]'s call. Pt will be in court at 12:30 so if you call after then just leaver her a message on her phone.

## 2017-02-09 NOTE — Telephone Encounter (Signed)
No I did not call her

## 2017-02-09 NOTE — Telephone Encounter (Signed)
I have not called her since I spoke with her. Dr. Nori Riis did you try to call this patient? Holly Mendez, CMA

## 2017-02-14 ENCOUNTER — Encounter: Payer: Medicaid Other | Admitting: Family Medicine

## 2017-02-19 ENCOUNTER — Telehealth: Payer: Self-pay | Admitting: Family Medicine

## 2017-02-19 NOTE — Telephone Encounter (Signed)
Pt would like something called in for a rash she has had come up. She is having an allergic reaction to some perfume she put on and has broke out all over her chest and stomach. She's been using Benadryl and it doesn't work. She has an appointment Wednesday w/[facility], but would like something before then.

## 2017-02-20 NOTE — Telephone Encounter (Signed)
Pt has an appt with Dr. Nori Riis 02/22/2016. [name], CMA

## 2017-02-20 NOTE — Telephone Encounter (Signed)
Dear [name] Team Without seeing the rash I do not know what to call in. If she needs a same day appt today see if we can work her in\ THANKS! [name]

## 2017-02-21 ENCOUNTER — Other Ambulatory Visit (HOSPITAL_COMMUNITY)
Admission: RE | Admit: 2017-02-21 | Discharge: 2017-02-21 | Disposition: A | Discharge status: Home / Self Care | Payer: Medicaid Other | Source: Ambulatory Visit | Attending: Family Medicine | Admitting: Family Medicine

## 2017-02-21 ENCOUNTER — Ambulatory Visit (INDEPENDENT_AMBULATORY_CARE_PROVIDER_SITE_OTHER): Payer: Medicaid Other | Admitting: Family Medicine

## 2017-02-21 ENCOUNTER — Other Ambulatory Visit: Payer: Self-pay

## 2017-02-21 ENCOUNTER — Encounter: Payer: Self-pay | Admitting: Family Medicine

## 2017-02-21 VITALS — BP 128/88 | HR 80 | Temp 97.7°F | Ht 65.0 in | Wt 256.4 lb

## 2017-02-21 DIAGNOSIS — R21 Rash and other nonspecific skin eruption: Secondary | ICD-10-CM

## 2017-02-21 DIAGNOSIS — Z124 Encounter for screening for malignant neoplasm of cervix: Secondary | ICD-10-CM

## 2017-02-21 MED ORDER — AMITRIPTYLINE HCL 25 MG PO TABS: ORAL_TABLET | ORAL | 5 refills | Status: DC

## 2017-02-22 DIAGNOSIS — Z124 Encounter for screening for malignant neoplasm of cervix: Secondary | ICD-10-CM | POA: Insufficient documentation

## 2017-02-22 NOTE — Progress Notes (Signed)
    CHIEF COMPLAINT / HPI: #1.  Rash.  Noticed after she used some particular perfume that she had a rash break out in the area where she sprayed the perfume.  She used over-the-counter Benadryl cream and it seemed to help some.  Still little bit itchy but overall improving. 2.  Mostly get her updated Pap smear today. 3.  She is having some more sweating than usual.  Wonders if she could be in menopause.  No night sweats.  No unusual weight change.  No fevers.  Energy level seems fine.  She is seems to be sweating more than usual when she is off.  REVIEW OF SYSTEMS: See HPI  PERTINENT  PMH / PSH: I have reviewed the patient's medications, allergies, past medical and surgical history, smoking status and updated in the EMR as appropriate.   OBJECTIVE:  Vital signs reviewed. GENERAL: Well-developed, well-nourished, no acute distress. CARDIOVASCULAR: Regular rate and rhythm no murmur gallop or rub LUNGS: Clear to auscultation bilaterally, no rales or wheeze. ABDOMEN: Soft positive bowel sounds NEURO: No gross focal neurological deficits. MSK: Movement of extremity x 4. SKIN: Resolving papular rash on her chest upper shoulder area.  No erythema. NECK: No lymphadenopathy. AXILLA/GROIN:: No lymphadenopathy. GU: Externally normal.  No adnexal masses or tenderness all this exam is limited by habitus.  Cervix appears normal.   ASSESSMENT / PLAN: #1.  Rash appears to be related to contact dermatitis, resolving.  Advised to discontinue that particular perfume 2.  Pap smear updated today. 3.  She is concerned about sweating.  I questioned her pretty extensively on that and it does not seem pathological.  I meant to check a TSH on her but we forgot.  I will check it next time she still having issues. 4.  Her chronic abdominal pain seems to be resolved right now.

## 2017-02-22 NOTE — Assessment & Plan Note (Signed)
Pap today 

## 2017-02-28 LAB — CYTOLOGY - PAP
Diagnosis: NEGATIVE
HPV: NOT DETECTED

## 2017-03-05 ENCOUNTER — Other Ambulatory Visit: Payer: Self-pay | Admitting: Family Medicine

## 2017-03-09 ENCOUNTER — Encounter: Payer: Self-pay | Admitting: Family Medicine

## 2017-03-15 ENCOUNTER — Other Ambulatory Visit: Payer: Self-pay | Admitting: Family Medicine

## 2017-03-22 ENCOUNTER — Other Ambulatory Visit: Payer: Self-pay | Admitting: Family Medicine

## 2017-03-22 NOTE — Telephone Encounter (Signed)
Pt said she is having thin milky white discharge and she said she hasn't had intercourse so she knows its nothing to do with that. Pt wants a refill on her flagyl sent to [facility]. Please advise

## 2017-03-23 MED ORDER — METRONIDAZOLE 500 MG PO TABS: ORAL_TABLET | ORAL | 1 refills | Status: DC

## 2017-03-23 NOTE — Telephone Encounter (Signed)
Pt informed of below. Zimmerman Rumple, Ki Luckman D, CMA  

## 2017-03-23 NOTE — Telephone Encounter (Signed)
.  dwtI have called it in. I am using it dosed differently this time so she wil take one twice a day for 7 days. If it does not resolve she needs an appointment [facility]! Holly Mendez

## 2017-03-27 ENCOUNTER — Other Ambulatory Visit: Payer: Self-pay | Admitting: Family Medicine

## 2017-03-30 ENCOUNTER — Encounter: Payer: Self-pay | Admitting: Family Medicine

## 2017-04-03 ENCOUNTER — Ambulatory Visit: Payer: Medicaid Other | Admitting: Nurse Practitioner

## 2017-04-03 ENCOUNTER — Telehealth: Payer: Self-pay | Admitting: Family Medicine

## 2017-04-03 DIAGNOSIS — G8929 Other chronic pain: Secondary | ICD-10-CM

## 2017-04-03 DIAGNOSIS — H5440 Blindness, one eye, unspecified eye: Secondary | ICD-10-CM

## 2017-04-03 DIAGNOSIS — M5442 Lumbago with sciatica, left side: Secondary | ICD-10-CM

## 2017-04-03 NOTE — Telephone Encounter (Signed)
Dear [name] Team I can fax the orders but I do not know where to send them.Does she have a nurse from [place] coming out? If so, get a fax number and I can send the orders [facility]! Holly Mendez

## 2017-04-03 NOTE — Telephone Encounter (Signed)
Spoke to pt. She will talk to her home health nurse tomorrow and calls [name] with either a fax number or to let us know pt will pick up the Rx. [name], CMA

## 2017-04-03 NOTE — Telephone Encounter (Signed)
Pt needs a Rx written for this pt to get a shower chair with a back on it and a cane for her to pick up. She was evaluated yesterday by her nurse aid and she was told she needed these things. Please advise

## 2017-04-04 ENCOUNTER — Encounter: Payer: Self-pay | Admitting: Nurse Practitioner

## 2017-04-04 ENCOUNTER — Other Ambulatory Visit: Payer: Self-pay

## 2017-04-04 ENCOUNTER — Ambulatory Visit: Payer: Medicaid Other | Attending: Nurse Practitioner | Admitting: Nurse Practitioner

## 2017-04-04 ENCOUNTER — Ambulatory Visit
Admission: RE | Admit: 2017-04-04 | Discharge: 2017-04-04 | Disposition: A | Discharge status: Home / Self Care | Payer: Medicaid Other | Source: Ambulatory Visit | Attending: Nurse Practitioner | Admitting: Nurse Practitioner

## 2017-04-04 DIAGNOSIS — M79602 Pain in left arm: Secondary | ICD-10-CM

## 2017-04-04 DIAGNOSIS — Z789 Other specified health status: Secondary | ICD-10-CM | POA: Insufficient documentation

## 2017-04-04 DIAGNOSIS — M25512 Pain in left shoulder: Secondary | ICD-10-CM | POA: Insufficient documentation

## 2017-04-04 DIAGNOSIS — M5441 Lumbago with sciatica, right side: Secondary | ICD-10-CM | POA: Insufficient documentation

## 2017-04-04 DIAGNOSIS — G894 Chronic pain syndrome: Secondary | ICD-10-CM | POA: Insufficient documentation

## 2017-04-04 DIAGNOSIS — K219 Gastro-esophageal reflux disease without esophagitis: Secondary | ICD-10-CM | POA: Diagnosis not present

## 2017-04-04 DIAGNOSIS — M549 Dorsalgia, unspecified: Secondary | ICD-10-CM

## 2017-04-04 DIAGNOSIS — M24812 Other specific joint derangements of left shoulder, not elsewhere classified: Secondary | ICD-10-CM | POA: Diagnosis not present

## 2017-04-04 DIAGNOSIS — M79603 Pain in arm, unspecified: Secondary | ICD-10-CM

## 2017-04-04 DIAGNOSIS — M47892 Other spondylosis, cervical region: Secondary | ICD-10-CM | POA: Insufficient documentation

## 2017-04-04 DIAGNOSIS — M797 Fibromyalgia: Secondary | ICD-10-CM | POA: Diagnosis not present

## 2017-04-04 DIAGNOSIS — M899 Disorder of bone, unspecified: Secondary | ICD-10-CM | POA: Diagnosis not present

## 2017-04-04 DIAGNOSIS — M25511 Pain in right shoulder: Secondary | ICD-10-CM | POA: Diagnosis not present

## 2017-04-04 DIAGNOSIS — Z9851 Tubal ligation status: Secondary | ICD-10-CM | POA: Insufficient documentation

## 2017-04-04 DIAGNOSIS — M79601 Pain in right arm: Secondary | ICD-10-CM

## 2017-04-04 DIAGNOSIS — M79604 Pain in right leg: Secondary | ICD-10-CM | POA: Diagnosis not present

## 2017-04-04 DIAGNOSIS — Z8619 Personal history of other infectious and parasitic diseases: Secondary | ICD-10-CM | POA: Diagnosis not present

## 2017-04-04 DIAGNOSIS — G8929 Other chronic pain: Secondary | ICD-10-CM | POA: Insufficient documentation

## 2017-04-04 DIAGNOSIS — Z9889 Other specified postprocedural states: Secondary | ICD-10-CM | POA: Insufficient documentation

## 2017-04-04 DIAGNOSIS — M546 Pain in thoracic spine: Secondary | ICD-10-CM | POA: Diagnosis not present

## 2017-04-04 DIAGNOSIS — M24811 Other specific joint derangements of right shoulder, not elsewhere classified: Secondary | ICD-10-CM | POA: Insufficient documentation

## 2017-04-04 DIAGNOSIS — Z79891 Long term (current) use of opiate analgesic: Secondary | ICD-10-CM | POA: Insufficient documentation

## 2017-04-04 DIAGNOSIS — F319 Bipolar disorder, unspecified: Secondary | ICD-10-CM | POA: Insufficient documentation

## 2017-04-04 DIAGNOSIS — F39 Unspecified mood [affective] disorder: Secondary | ICD-10-CM | POA: Insufficient documentation

## 2017-04-04 DIAGNOSIS — Z87891 Personal history of nicotine dependence: Secondary | ICD-10-CM | POA: Diagnosis not present

## 2017-04-04 DIAGNOSIS — I10 Essential (primary) hypertension: Secondary | ICD-10-CM | POA: Insufficient documentation

## 2017-04-04 DIAGNOSIS — Z79899 Other long term (current) drug therapy: Secondary | ICD-10-CM | POA: Diagnosis not present

## 2017-04-04 DIAGNOSIS — H5440 Blindness, one eye, unspecified eye: Secondary | ICD-10-CM | POA: Diagnosis not present

## 2017-04-04 DIAGNOSIS — R739 Hyperglycemia, unspecified: Secondary | ICD-10-CM | POA: Insufficient documentation

## 2017-04-04 DIAGNOSIS — E7800 Pure hypercholesterolemia, unspecified: Secondary | ICD-10-CM | POA: Diagnosis not present

## 2017-04-04 DIAGNOSIS — F259 Schizoaffective disorder, unspecified: Secondary | ICD-10-CM | POA: Insufficient documentation

## 2017-04-04 DIAGNOSIS — Z809 Family history of malignant neoplasm, unspecified: Secondary | ICD-10-CM | POA: Insufficient documentation

## 2017-04-04 DIAGNOSIS — Z8249 Family history of ischemic heart disease and other diseases of the circulatory system: Secondary | ICD-10-CM | POA: Diagnosis not present

## 2017-04-04 DIAGNOSIS — M4184 Other forms of scoliosis, thoracic region: Secondary | ICD-10-CM | POA: Diagnosis not present

## 2017-04-04 DIAGNOSIS — R109 Unspecified abdominal pain: Secondary | ICD-10-CM | POA: Diagnosis not present

## 2017-04-04 DIAGNOSIS — J45909 Unspecified asthma, uncomplicated: Secondary | ICD-10-CM | POA: Diagnosis not present

## 2017-04-04 DIAGNOSIS — M50323 Other cervical disc degeneration at C6-C7 level: Secondary | ICD-10-CM | POA: Insufficient documentation

## 2017-04-04 DIAGNOSIS — I671 Cerebral aneurysm, nonruptured: Secondary | ICD-10-CM | POA: Insufficient documentation

## 2017-04-04 DIAGNOSIS — M2578 Osteophyte, vertebrae: Secondary | ICD-10-CM | POA: Diagnosis not present

## 2017-04-04 NOTE — Telephone Encounter (Signed)
Pt called back and said the Rx needs to be sent to [facility] on [address]. Their fax number is [phone]. She also said the Rx's have to specify that the shower chair needs a back rest on it and the cane needs to be a hurry cane with the three feet on the bottom, not the straight cane.

## 2017-04-04 NOTE — Progress Notes (Signed)
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 kg)  Psych/Mental status: Alert, oriented x 3 (person, place, & time)       Eyes: PERLA Respiratory: No evidence of acute respiratory distress  Cervical Spine Exam  Inspection: No masses, redness, or swelling Alignment: Symmetrical Functional ROM: Unrestricted ROM      Stability: No instability detected Muscle strength & Tone: Functionally intact Sensory: Unimpaired Palpation: Complains of area being tender to palpation              Upper Extremity (UE) Exam    Side: Right upper extremity  Side: Left upper extremity  Inspection: No masses, redness, swelling, or asymmetry. No contractures  Inspection: No masses, redness, swelling, or asymmetry. No contractures  Functional ROM: Guarding for shoulder with decreased range of motion  Functional ROM: Guarding for shoulder with decreased range of motion  Muscle strength & Tone: Functionally intact  Muscle strength & Tone: Functionally intact  Sensory: Unimpaired  Sensory: Unimpaired  Palpation: No palpable  anomalies              Palpation: No palpable anomalies              Specialized Test(s): Deferred         Specialized Test(s): Deferred          Thoracic Spine Exam  Inspection: No masses, redness, or swelling Alignment: Symmetrical Functional ROM: Unrestricted ROM Stability: No instability detected Sensory: Unimpaired Muscle strength & Tone: Complains of area being tender to palpation  Lumbar Spine Exam  Inspection: No masses, redness, or swelling Alignment: Symmetrical Functional ROM: Unrestricted ROM      Stability: No instability detected Muscle strength & Tone: Functionally intact Sensory: Unimpaired Palpation: Complains of area being tender to palpation  Provocative Tests: Lumbar Hyperextension and rotation test: Positive bilaterally for facet joint pain. positive leg raise right than 45 Patrick's Maneuver: Negative                    Gait & Posture Assessment  Ambulation: Limitedsecondary to swelling in the left foot Gait: Antalgic Posture: WNL   Lower Extremity Exam    Side: Right lower extremity  Side: Left lower extremity  Inspection: No masses, redness, swelling, or asymmetry. No contractures  Inspection: No masses, redness, swelling, or asymmetry. No contractures  Functional ROM: Guarding          Functional ROM: Guarding          Muscle strength & Tone: Functionally intact  Muscle strength & Tone: Functionally intact  Sensory: Unimpaired  Sensory: Unimpaired  Palpation: No palpable anomalies  Palpation: No palpable anomalies   Assessment  Primary Diagnosis & Pertinent Problem List: Diagnoses of Chronic bilateral low back pain with right-sided sciatica (Primary Area of Pain), Chronic pain of right lower extremity (Secondary Area of Pain), Chronic abdominal pain (Tertiary Area of Pain), Chronic upper back pain (Fourth Area of Pain), Chronic pain of both upper extremities, Chronic pain syndrome, Long term current use of opiate analgesic, Pharmacologic therapy,  Disorder of skeletal system, and Problems influencing health status were pertinent to this visit.  Visit Diagnosis: 1. Chronic bilateral low back pain with right-sided sciatica (Primary Area of Pain)   2. Chronic pain of right lower extremity (Secondary Area of Pain)   3. Chronic abdominal pain (Tertiary Area of Pain)   4. Chronic upper back pain (Fourth Area of Pain)   5. Chronic pain of both upper extremities   6. Chronic pain syndrome   7. Long term current use of opiate analgesic   8. Pharmacologic therapy   9. Disorder of skeletal system   10. Problems influencing health status    Plan of Care  Initial treatment plan:  Please be advised that as per protocol, today's visit has been an evaluation only. We have not taken over the patient's controlled substance management.  Problem-specific plan: No problem-specific Assessment & Plan notes found for this encounter.  Ordered Lab-work, Procedure(s), Referral(s), & Consult(s): Orders Placed This Encounter  Procedures  . DG Lumbar Spine Complete W/Bend  . DG Cervical Spine With Flex & Extend  . DG Shoulder Right  . DG Shoulder Left  . DG Thoracic Spine 2 View  . Compliance Drug Analysis, Ur  . Comp. Metabolic Panel (12)  . Magnesium  . Vitamin B12  . Sedimentation rate  . 25-Hydroxyvitamin D Lcms D2+D3  . C-reactive protein  . Ambulatory referral to Psychology   Pharmacotherapy: Medications ordered:  No orders of the defined types were placed in this encounter.  Medications administered during this visit: Yovana C. Brown-Ky had no medications administered during this visit.   Pharmacotherapy under consideration:  Opioid Analgesics: The patient was informed that there is no guarantee that she would be a candidate for opioid analgesics. The decision will be made following CDC guidelines. This decision will be based on the results of diagnostic studies, as well as Ms. Brown-Boldon's risk profile.  Membrane  stabilizer: To be determined at a later time Muscle relaxant: To be determined at a later time NSAID: To be determined at a later time Other analgesic(s): To be determined at a later time   Interventional therapies under consideration: Ms. Bors was informed that there is no guarantee that she would be a  candidate for interventional therapies. The decision will be based on the results of diagnostic studies, as well as Ms. Brown-Deleonardis's risk profile.  Possible procedure(s): Diagnostic right-sided LESI Diagnostic bilateral lumbar facet nerve block Possible bilateral lumbar facet RFA Trigger point injection    Provider-requested follow-up: Return for 2nd Visit, w/ Dr. Dossie Arbour, after [facility] eval, medical record release.  No future appointments.  Primary Care Physician: Dickie La, MD Location: [facility] Note by:  Date: 04/04/2017; Time: 3:39 PM  Pain Score Disclaimer: We use the NRS-11 scale. This is a self-reported, subjective measurement of pain severity with only modest accuracy. It is used primarily to identify changes within a particular patient. It must be understood that outpatient pain scales are significantly less accurate that those used for research, where they can be applied under ideal controlled circumstances with minimal exposure to variables. In reality, the score is likely to be a combination of pain intensity and pain affect, where pain affect describes the degree of emotional arousal or changes in action readiness caused by the sensory experience of pain. Factors such as social and work situation, setting, emotional state, anxiety levels, expectation, and prior pain experience may influence pain perception and show large inter-individual differences that may also be affected by time variables.  Patient instructions provided during this appointment: Patient Instructions   You will have med/psych eval and Xrays as ordered  completed prior to next appointment with pain clinic.____________________________________________________________________________________________  Appointment Policy Summary  It is our goal and responsibility to provide the medical community with assistance in the evaluation and management of patients with chronic pain. Unfortunately our resources are limited. Because we do not have an unlimited amount of time, or available appointments, we are required to closely monitor and manage their use. The following rules exist to maximize their use:  Patient's responsibilities: 1. Punctuality:  At what time should I arrive? You should be physically present in our office 30 minutes before your scheduled appointment. Your scheduled appointment is with your assigned healthcare provider. However, it takes 5-10 minutes to be "checked-in", and another 15 minutes for the nurses to do the admission. If you arrive to our office at the time you were given for your appointment, you will end up being at least 20-25 minutes late to your appointment with the provider. 2. Tardiness:  What happens if I arrive only a few minutes after my scheduled appointment time? You will need to reschedule your appointment. The cutoff is your appointment time. This is why it is so important that you arrive at least 30 minutes before that appointment. If you have an appointment scheduled for 10:00 AM and you arrive at 10:01, you will be required to reschedule your appointment.  3. Plan ahead:  Always assume that you will encounter traffic on your way in. Plan for it. If you are dependent on a driver, make sure they understand these rules and the need to arrive early. 4. Other appointments and responsibilities:  Avoid scheduling any other appointments before or after your pain clinic appointments.  5. Be prepared:  Write down everything that you need to discuss with your healthcare provider and give this information to the admitting nurse.  Write down the medications that you will need refilled. Bring your pills and bottles (even the empty ones), to all of your appointments, except for those where a procedure is scheduled. 6. No children or pets:  Find someone to take care of them. It is not appropriate to bring  them in. 7. Scheduling changes:  We request "advanced notification" of any changes or cancellations. 8. Advanced notification:  Defined as a time period of more than 24 hours prior to the originally scheduled appointment. This allows for the appointment to be offered to other patients. 9. Rescheduling:  When a visit is rescheduled, it will require the cancellation of the original appointment. For this reason they both fall within the category of "Cancellations".  10. Cancellations:  They require advanced notification. Any cancellation less than 24 hours before the  appointment will be recorded as a "No Show". 11. No Show:  Defined as an unkept appointment where the patient failed to notify or declare to the practice their intention or inability to keep the appointment.  Corrective process for repeat offenders:  1. Tardiness: Three (3) episodes of rescheduling due to late arrivals will be recorded as one (1) "No Show". 2. Cancellation or reschedule: Three (3) cancellations or rescheduling will be recorded as one (1) "No Show". 3. "No Shows": Three (3) "No Shows" within a 12 month period will result in discharge from the practice. ____________________________________________________________________________________________  ____________________________________________________________________________________________  Pain Scale  Introduction: The pain score used by this practice is the Verbal Numerical Rating Scale (VNRS-11). This is an 11-point scale. It is for adults and children 10 years or older. There are significant differences in how the pain score is reported, used, and applied. Forget everything you learned in the  past and learn this scoring system.  General Information: The scale should reflect your current level of pain. Unless you are specifically asked for the level of your worst pain, or your average pain. If you are asked for one of these two, then it should be understood that it is over the past 24 hours.  Basic Activities of Daily Living (ADL): Personal hygiene, dressing, eating, transferring, and using restroom.  Instructions: Most patients tend to report their level of pain as a combination of two factors, their physical pain and their psychosocial pain. This last one is also known as "suffering" and it is reflection of how physical pain affects you socially and psychologically. From now on, report them separately. From this point on, when asked to report your pain level, report only your physical pain. Use the following table for reference.  Pain Clinic Pain Levels (0-5/10)  Pain Level Score  Description  No Pain 0   Mild pain 1 Nagging, annoying, but does not interfere with basic activities of daily living (ADL). Patients are able to eat, bathe, get dressed, toileting (being able to get on and off the toilet and perform personal hygiene functions), transfer (move in and out of bed or a chair without assistance), and maintain continence (able to control bladder and bowel functions). Blood pressure and heart rate are unaffected. A normal heart rate for a healthy adult ranges from 60 to 100 bpm (beats per minute).   Mild to moderate pain 2 Noticeable and distracting. Impossible to hide from other people. More frequent flare-ups. Still possible to adapt and function close to normal. It can be very annoying and may have occasional stronger flare-ups. With discipline, patients may get used to it and adapt.   Moderate pain 3 Interferes significantly with activities of daily living (ADL). It becomes difficult to feed, bathe, get dressed, get on and off the toilet or to perform personal hygiene functions.  Difficult to get in and out of bed or a chair without assistance. Very distracting. With effort, it can be ignored when deeply  involved in activities.   Moderately severe pain 4 Impossible to ignore for more than a few minutes. With effort, patients may still be able to manage work or participate in some social activities. Very difficult to concentrate. Signs of autonomic nervous system discharge are evident: dilated pupils (mydriasis); mild sweating (diaphoresis); sleep interference. Heart rate becomes elevated (>115 bpm). Diastolic blood pressure (lower number) rises above 100 mmHg. Patients find relief in laying down and not moving.   Severe pain 5 Intense and extremely unpleasant. Associated with frowning face and frequent crying. Pain overwhelms the senses.  Ability to do any activity or maintain social relationships becomes significantly limited. Conversation becomes difficult. Pacing back and forth is common, as getting into a comfortable position is nearly impossible. Pain wakes you up from deep sleep. Physical signs will be obvious: pupillary dilation; increased sweating; goosebumps; brisk reflexes; cold, clammy hands and feet; nausea, vomiting or dry heaves; loss of appetite; significant sleep disturbance with inability to fall asleep or to remain asleep. When persistent, significant weight loss is observed due to the complete loss of appetite and sleep deprivation.  Blood pressure and heart rate becomes significantly elevated. Caution: If elevated blood pressure triggers a pounding headache associated with blurred vision, then the patient should immediately seek attention at an urgent or emergency care unit, as these may be signs of an impending stroke.    Emergency Department Pain Levels (6-10/10)  Emergency Room Pain 6 Severely limiting. Requires emergency care and should not be seen or managed at an outpatient pain management facility. Communication becomes difficult and requires great  effort. Assistance to reach the emergency department may be required. Facial flushing and profuse sweating along with potentially dangerous increases in heart rate and blood pressure will be evident.   Distressing pain 7 Self-care is very difficult. Assistance is required to transport, or use restroom. Assistance to reach the emergency department will be required. Tasks requiring coordination, such as bathing and getting dressed become very difficult.   Disabling pain 8 Self-care is no longer possible. At this level, pain is disabling. The individual is unable to do even the most "basic" activities such as walking, eating, bathing, dressing, transferring to a bed, or toileting. Fine motor skills are lost. It is difficult to think clearly.   Incapacitating pain 9 Pain becomes incapacitating. Thought processing is no longer possible. Difficult to remember your own name. Control of movement and coordination are lost.   The worst pain imaginable 10 At this level, most patients pass out from pain. When this level is reached, collapse of the autonomic nervous system occurs, leading to a sudden drop in blood pressure and heart rate. This in turn results in a temporary and dramatic drop in blood flow to the brain, leading to a loss of consciousness. Fainting is one of the body's self defense mechanisms. Passing out puts the brain in a calmed state and causes it to shut down for a while, in order to begin the healing process.    Summary: 1. Refer to this scale when providing [name] with your pain level. 2. Be accurate and careful when reporting your pain level. This will help with your care. 3. Over-reporting your pain level will lead to loss of credibility. 4. Even a level of 1/10 means that there is pain and will be treated at our facility. 5. High, inaccurate reporting will be documented as "Symptom Exaggeration", leading to loss of credibility and suspicions of possible secondary gains such as obtaining more  narcotics,  or wanting to appear disabled, for fraudulent reasons. 6. Only pain levels of 5 or below will be seen at our facility. 7. Pain levels of 6 and above will be sent to the Emergency Department and the appointment cancelled. ____________________________________________________________________________________________   BMI Assessment: Estimated body mass index is 43.9 kg/m as calculated from the following:   Height as of this encounter: _0  (1.651 m).   Weight as of this encounter: 263 lb 12.8 oz (119.7 kg).  BMI interpretation table: BMI level Category Range association with higher incidence of chronic pain  <18 kg/m2 Underweight   18.5-24.9 kg/m2 Ideal body weight   25-29.9 kg/m2 Overweight Increased incidence by 20%  30-34.9 kg/m2 Obese (Class I) Increased incidence by 68%  35-39.9 kg/m2 Severe obesity (Class II) Increased incidence by 136%  >40 kg/m2 Extreme obesity (Class III) Increased incidence by 254%   BMI Readings from Last 4 Encounters:  04/04/17 43.90 kg/m  02/21/17 42.67 kg/m  01/03/17 40.40 kg/m  09/06/16 34.25 kg/m   Wt Readings from Last 4 Encounters:  04/04/17 263 lb 12.8 oz (119.7 kg)  02/21/17 256 lb 6.4 oz (116.3 kg)  01/03/17 242 lb 12.8 oz (110.1 kg)  09/06/16 205 lb 12.8 oz (93.4 kg)

## 2017-04-04 NOTE — Progress Notes (Signed)
Safety precautions to be maintained throughout the outpatient stay will include: orient to surroundings, keep bed in low position, maintain call bell within reach at all times, provide assistance with transfer out of bed and ambulation.  

## 2017-04-04 NOTE — Patient Instructions (Addendum)
You will have med/psych eval and Xrays as ordered completed prior to next appointment with pain clinic.____________________________________________________________________________________________  Appointment Policy Summary  It is our goal and responsibility to provide the medical community with assistance in the evaluation and management of patients with chronic pain. Unfortunately our resources are limited. Because we do not have an unlimited amount of time, or available appointments, we are required to closely monitor and manage their use. The following rules exist to maximize their use:  Patient's responsibilities: 1. Punctuality:  At what time should I arrive? You should be physically present in our office 30 minutes before your scheduled appointment. Your scheduled appointment is with your assigned healthcare provider. However, it takes 5-10 minutes to be "checked-in", and another 15 minutes for the nurses to do the admission. If you arrive to our office at the time you were given for your appointment, you will end up being at least 20-25 minutes late to your appointment with the provider. 2. Tardiness:  What happens if I arrive only a few minutes after my scheduled appointment time? You will need to reschedule your appointment. The cutoff is your appointment time. This is why it is so important that you arrive at least 30 minutes before that appointment. If you have an appointment scheduled for 10:00 AM and you arrive at 10:01, you will be required to reschedule your appointment.  3. Plan ahead:  Always assume that you will encounter traffic on your way in. Plan for it. If you are dependent on a driver, make sure they understand these rules and the need to arrive early. 4. Other appointments and responsibilities:  Avoid scheduling any other appointments before or after your pain clinic appointments.  5. Be prepared:  Write down everything that you need to discuss with your healthcare provider  and give this information to the admitting nurse. Write down the medications that you will need refilled. Bring your pills and bottles (even the empty ones), to all of your appointments, except for those where a procedure is scheduled. 6. No children or pets:  Find someone to take care of them. It is not appropriate to bring them in. 7. Scheduling changes:  We request "advanced notification" of any changes or cancellations. 8. Advanced notification:  Defined as a time period of more than 24 hours prior to the originally scheduled appointment. This allows for the appointment to be offered to other patients. 9. Rescheduling:  When a visit is rescheduled, it will require the cancellation of the original appointment. For this reason they both fall within the category of "Cancellations".  10. Cancellations:  They require advanced notification. Any cancellation less than 24 hours before the  appointment will be recorded as a "No Show". 11. No Show:  Defined as an unkept appointment where the patient failed to notify or declare to the practice their intention or inability to keep the appointment.  Corrective process for repeat offenders:  1. Tardiness: Three (3) episodes of rescheduling due to late arrivals will be recorded as one (1) "No Show". 2. Cancellation or reschedule: Three (3) cancellations or rescheduling will be recorded as one (1) "No Show". 3. "No Shows": Three (3) "No Shows" within a 12 month period will result in discharge from the practice. ____________________________________________________________________________________________  ____________________________________________________________________________________________  Pain Scale  Introduction: The pain score used by this practice is the Verbal Numerical Rating Scale (VNRS-11). This is an 11-point scale. It is for adults and children 10 years or older. There are significant differences in how the  pain score is reported, used,  and applied. Forget everything you learned in the past and learn this scoring system.  General Information: The scale should reflect your current level of pain. Unless you are specifically asked for the level of your worst pain, or your average pain. If you are asked for one of these two, then it should be understood that it is over the past 24 hours.  Basic Activities of Daily Living (ADL): Personal hygiene, dressing, eating, transferring, and using restroom.  Instructions: Most patients tend to report their level of pain as a combination of two factors, their physical pain and their psychosocial pain. This last one is also known as "suffering" and it is reflection of how physical pain affects you socially and psychologically. From now on, report them separately. From this point on, when asked to report your pain level, report only your physical pain. Use the following table for reference.  Pain Clinic Pain Levels (0-5/10)  Pain Level Score  Description  No Pain 0   Mild pain 1 Nagging, annoying, but does not interfere with basic activities of daily living (ADL). Patients are able to eat, bathe, get dressed, toileting (being able to get on and off the toilet and perform personal hygiene functions), transfer (move in and out of bed or a chair without assistance), and maintain continence (able to control bladder and bowel functions). Blood pressure and heart rate are unaffected. A normal heart rate for a healthy adult ranges from 60 to 100 bpm (beats per minute).   Mild to moderate pain 2 Noticeable and distracting. Impossible to hide from other people. More frequent flare-ups. Still possible to adapt and function close to normal. It can be very annoying and may have occasional stronger flare-ups. With discipline, patients may get used to it and adapt.   Moderate pain 3 Interferes significantly with activities of daily living (ADL). It becomes difficult to feed, bathe, get dressed, get on and off the  toilet or to perform personal hygiene functions. Difficult to get in and out of bed or a chair without assistance. Very distracting. With effort, it can be ignored when deeply involved in activities.   Moderately severe pain 4 Impossible to ignore for more than a few minutes. With effort, patients may still be able to manage work or participate in some social activities. Very difficult to concentrate. Signs of autonomic nervous system discharge are evident: dilated pupils (mydriasis); mild sweating (diaphoresis); sleep interference. Heart rate becomes elevated (>115 bpm). Diastolic blood pressure (lower number) rises above 100 mmHg. Patients find relief in laying down and not moving.   Severe pain 5 Intense and extremely unpleasant. Associated with frowning face and frequent crying. Pain overwhelms the senses.  Ability to do any activity or maintain social relationships becomes significantly limited. Conversation becomes difficult. Pacing back and forth is common, as getting into a comfortable position is nearly impossible. Pain wakes you up from deep sleep. Physical signs will be obvious: pupillary dilation; increased sweating; goosebumps; brisk reflexes; cold, clammy hands and feet; nausea, vomiting or dry heaves; loss of appetite; significant sleep disturbance with inability to fall asleep or to remain asleep. When persistent, significant weight loss is observed due to the complete loss of appetite and sleep deprivation.  Blood pressure and heart rate becomes significantly elevated. Caution: If elevated blood pressure triggers a pounding headache associated with blurred vision, then the patient should immediately seek attention at an urgent or emergency care unit, as these may be signs of an  impending stroke.    Emergency Department Pain Levels (6-10/10)  Emergency Room Pain 6 Severely limiting. Requires emergency care and should not be seen or managed at an outpatient pain management facility.  Communication becomes difficult and requires great effort. Assistance to reach the emergency department may be required. Facial flushing and profuse sweating along with potentially dangerous increases in heart rate and blood pressure will be evident.   Distressing pain 7 Self-care is very difficult. Assistance is required to transport, or use restroom. Assistance to reach the emergency department will be required. Tasks requiring coordination, such as bathing and getting dressed become very difficult.   Disabling pain 8 Self-care is no longer possible. At this level, pain is disabling. The individual is unable to do even the most "basic" activities such as walking, eating, bathing, dressing, transferring to a bed, or toileting. Fine motor skills are lost. It is difficult to think clearly.   Incapacitating pain 9 Pain becomes incapacitating. Thought processing is no longer possible. Difficult to remember your own name. Control of movement and coordination are lost.   The worst pain imaginable 10 At this level, most patients pass out from pain. When this level is reached, collapse of the autonomic nervous system occurs, leading to a sudden drop in blood pressure and heart rate. This in turn results in a temporary and dramatic drop in blood flow to the brain, leading to a loss of consciousness. Fainting is one of the body's self defense mechanisms. Passing out puts the brain in a calmed state and causes it to shut down for a while, in order to begin the healing process.    Summary: 1. Refer to this scale when providing [name] with your pain level. 2. Be accurate and careful when reporting your pain level. This will help with your care. 3. Over-reporting your pain level will lead to loss of credibility. 4. Even a level of 1/10 means that there is pain and will be treated at our facility. 5. High, inaccurate reporting will be documented as "Symptom Exaggeration", leading to loss of credibility and suspicions  of possible secondary gains such as obtaining more narcotics, or wanting to appear disabled, for fraudulent reasons. 6. Only pain levels of 5 or below will be seen at our facility. 7. Pain levels of 6 and above will be sent to the Emergency Department and the appointment cancelled. ____________________________________________________________________________________________   BMI Assessment: Estimated body mass index is 43.9 kg/m as calculated from the following:   Height as of this encounter: 5\' 5"  (1.651 m).   Weight as of this encounter: 263 lb 12.8 oz (119.7 kg).  BMI interpretation table: BMI level Category Range association with higher incidence of chronic pain  <18 kg/m2 Underweight   18.5-24.9 kg/m2 Ideal body weight   25-29.9 kg/m2 Overweight Increased incidence by 20%  30-34.9 kg/m2 Obese (Class I) Increased incidence by 68%  35-39.9 kg/m2 Severe obesity (Class II) Increased incidence by 136%  >40 kg/m2 Extreme obesity (Class III) Increased incidence by 254%   BMI Readings from Last 4 Encounters:  04/04/17 43.90 kg/m  02/21/17 42.67 kg/m  01/03/17 40.40 kg/m  09/06/16 34.25 kg/m   Wt Readings from Last 4 Encounters:  04/04/17 263 lb 12.8 oz (119.7 kg)  02/21/17 256 lb 6.4 oz (116.3 kg)  01/03/17 242 lb 12.8 oz (110.1 kg)  09/06/16 205 lb 12.8 oz (93.4 kg)

## 2017-04-05 NOTE — Progress Notes (Signed)
Results were reviewed and found to be: mildly abnormal  No acute injury or pathology identified  Review would suggest interventional pain management techniques may be of benefit 

## 2017-04-05 NOTE — Telephone Encounter (Signed)
Done Aryssa Rosamond  

## 2017-04-06 ENCOUNTER — Other Ambulatory Visit: Payer: Self-pay | Admitting: Family Medicine

## 2017-04-08 LAB — COMPLIANCE DRUG ANALYSIS, UR

## 2017-04-09 LAB — COMP. METABOLIC PANEL (12)
A/G RATIO: 1.3 (ref 1.2–2.2)
ALBUMIN: 4.3 g/dL (ref 3.5–5.5)
AST: 19 IU/L (ref 0–40)
Alkaline Phosphatase: 101 IU/L (ref 39–117)
BUN / CREAT RATIO: 10 (ref 9–23)
BUN: 7 mg/dL (ref 6–24)
Bilirubin Total: <0.2 mg/dL (ref 0.0–1.2)
CALCIUM: 9.3 mg/dL (ref 8.7–10.2)
Chloride: 102 mmol/L (ref 96–106)
Creatinine, Ser: 0.69 mg/dL (ref 0.57–1.00)
GFR, EST AFRICAN AMERICAN: 123 mL/min/{1.73_m2} (ref >59)
GFR, EST NON AFRICAN AMERICAN: 107 mL/min/{1.73_m2} (ref >59)
GLUCOSE: 98 mg/dL (ref 65–99)
Globulin, Total: 3.2 g/dL (ref 1.5–4.5)
POTASSIUM: 4.4 mmol/L (ref 3.5–5.2)
SODIUM: 141 mmol/L (ref 134–144)
TOTAL PROTEIN: 7.5 g/dL (ref 6.0–8.5)

## 2017-04-09 LAB — MAGNESIUM: Magnesium: 2.1 mg/dL (ref 1.6–2.3)

## 2017-04-09 LAB — 25-HYDROXY VITAMIN D LCMS D2+D3
25-Hydroxy, Vitamin D-2: <1 ng/mL
25-Hydroxy, Vitamin D: 13 ng/mL — ABNORMAL LOW

## 2017-04-09 LAB — C-REACTIVE PROTEIN: CRP: 6.1 mg/L — AB (ref 0.0–4.9)

## 2017-04-09 LAB — 25-HYDROXYVITAMIN D LCMS D2+D3: 25-HYDROXY, VITAMIN D-3: 13 ng/mL

## 2017-04-09 LAB — SEDIMENTATION RATE: SED RATE: 21 mm/h (ref 0–32)

## 2017-04-09 LAB — VITAMIN B12: Vitamin B-12: 495 pg/mL (ref 232–1245)

## 2017-04-10 ENCOUNTER — Telehealth: Payer: Self-pay | Admitting: Family Medicine

## 2017-04-10 NOTE — Telephone Encounter (Signed)
Would like results from last weeks xrays.

## 2017-04-11 NOTE — Telephone Encounter (Signed)
Dear Holly Mendez Team Please let her know the X rays shoed some mild to moderate arthrotic changes in her neck and shoulder. Nothing worrisome.  (Note to White Team: Just FYI in case she asks why we did not call her sooner---WE did not order X rays.  X rays were ordered by pain clinic and they should have given her results but I have looked at reports and the above message is correct)

## 2017-04-11 NOTE — Telephone Encounter (Signed)
Pt informed. Appreciated the call. [name], CMA

## 2017-04-18 ENCOUNTER — Encounter: Payer: Self-pay | Admitting: Pain Medicine

## 2017-04-18 ENCOUNTER — Ambulatory Visit: Payer: Medicaid Other | Attending: Pain Medicine | Admitting: Pain Medicine

## 2017-04-18 VITALS — BP 121/93 | HR 97 | Temp 98.4°F | Resp 18 | Ht 65.0 in | Wt 262.0 lb

## 2017-04-18 DIAGNOSIS — M4124 Other idiopathic scoliosis, thoracic region: Secondary | ICD-10-CM | POA: Insufficient documentation

## 2017-04-18 DIAGNOSIS — M797 Fibromyalgia: Secondary | ICD-10-CM | POA: Diagnosis not present

## 2017-04-18 DIAGNOSIS — Z8249 Family history of ischemic heart disease and other diseases of the circulatory system: Secondary | ICD-10-CM | POA: Insufficient documentation

## 2017-04-18 DIAGNOSIS — M150 Primary generalized (osteo)arthritis: Secondary | ICD-10-CM

## 2017-04-18 DIAGNOSIS — M5441 Lumbago with sciatica, right side: Secondary | ICD-10-CM | POA: Diagnosis not present

## 2017-04-18 DIAGNOSIS — G8929 Other chronic pain: Secondary | ICD-10-CM | POA: Insufficient documentation

## 2017-04-18 DIAGNOSIS — Z791 Long term (current) use of non-steroidal anti-inflammatories (NSAID): Secondary | ICD-10-CM | POA: Diagnosis not present

## 2017-04-18 DIAGNOSIS — E7800 Pure hypercholesterolemia, unspecified: Secondary | ICD-10-CM | POA: Diagnosis not present

## 2017-04-18 DIAGNOSIS — M19011 Primary osteoarthritis, right shoulder: Secondary | ICD-10-CM

## 2017-04-18 DIAGNOSIS — Z9889 Other specified postprocedural states: Secondary | ICD-10-CM | POA: Diagnosis not present

## 2017-04-18 DIAGNOSIS — Z79891 Long term (current) use of opiate analgesic: Secondary | ICD-10-CM | POA: Insufficient documentation

## 2017-04-18 DIAGNOSIS — M47817 Spondylosis without myelopathy or radiculopathy, lumbosacral region: Secondary | ICD-10-CM

## 2017-04-18 DIAGNOSIS — J45909 Unspecified asthma, uncomplicated: Secondary | ICD-10-CM | POA: Insufficient documentation

## 2017-04-18 DIAGNOSIS — M5136 Other intervertebral disc degeneration, lumbar region: Secondary | ICD-10-CM | POA: Insufficient documentation

## 2017-04-18 DIAGNOSIS — M19012 Primary osteoarthritis, left shoulder: Secondary | ICD-10-CM | POA: Insufficient documentation

## 2017-04-18 DIAGNOSIS — M79604 Pain in right leg: Secondary | ICD-10-CM

## 2017-04-18 DIAGNOSIS — M5431 Sciatica, right side: Secondary | ICD-10-CM | POA: Diagnosis not present

## 2017-04-18 DIAGNOSIS — I10 Essential (primary) hypertension: Secondary | ICD-10-CM | POA: Insufficient documentation

## 2017-04-18 DIAGNOSIS — K219 Gastro-esophageal reflux disease without esophagitis: Secondary | ICD-10-CM | POA: Insufficient documentation

## 2017-04-18 DIAGNOSIS — Z809 Family history of malignant neoplasm, unspecified: Secondary | ICD-10-CM | POA: Insufficient documentation

## 2017-04-18 DIAGNOSIS — M549 Dorsalgia, unspecified: Secondary | ICD-10-CM | POA: Diagnosis not present

## 2017-04-18 DIAGNOSIS — H5462 Unqualified visual loss, left eye, normal vision right eye: Secondary | ICD-10-CM | POA: Diagnosis not present

## 2017-04-18 DIAGNOSIS — M5030 Other cervical disc degeneration, unspecified cervical region: Secondary | ICD-10-CM | POA: Insufficient documentation

## 2017-04-18 DIAGNOSIS — F319 Bipolar disorder, unspecified: Secondary | ICD-10-CM | POA: Diagnosis not present

## 2017-04-18 DIAGNOSIS — R51 Headache: Secondary | ICD-10-CM | POA: Insufficient documentation

## 2017-04-18 DIAGNOSIS — R109 Unspecified abdominal pain: Secondary | ICD-10-CM | POA: Diagnosis not present

## 2017-04-18 DIAGNOSIS — M25511 Pain in right shoulder: Secondary | ICD-10-CM | POA: Diagnosis not present

## 2017-04-18 DIAGNOSIS — Z79899 Other long term (current) drug therapy: Secondary | ICD-10-CM | POA: Insufficient documentation

## 2017-04-18 DIAGNOSIS — Z9851 Tubal ligation status: Secondary | ICD-10-CM | POA: Insufficient documentation

## 2017-04-18 DIAGNOSIS — K861 Other chronic pancreatitis: Secondary | ICD-10-CM | POA: Insufficient documentation

## 2017-04-18 DIAGNOSIS — G894 Chronic pain syndrome: Secondary | ICD-10-CM | POA: Diagnosis not present

## 2017-04-18 DIAGNOSIS — M7918 Myalgia, other site: Secondary | ICD-10-CM

## 2017-04-18 DIAGNOSIS — M47816 Spondylosis without myelopathy or radiculopathy, lumbar region: Secondary | ICD-10-CM | POA: Diagnosis not present

## 2017-04-18 DIAGNOSIS — M159 Polyosteoarthritis, unspecified: Secondary | ICD-10-CM | POA: Insufficient documentation

## 2017-04-18 DIAGNOSIS — E559 Vitamin D deficiency, unspecified: Secondary | ICD-10-CM | POA: Insufficient documentation

## 2017-04-18 DIAGNOSIS — M25512 Pain in left shoulder: Secondary | ICD-10-CM

## 2017-04-18 DIAGNOSIS — I671 Cerebral aneurysm, nonruptured: Secondary | ICD-10-CM | POA: Insufficient documentation

## 2017-04-18 MED ORDER — MELOXICAM 15 MG PO TABS: 15.0000 mg | ORAL_TABLET | Freq: Every day | ORAL | 0 refills | Status: AC

## 2017-04-18 MED ORDER — VITAMIN D3 125 MCG (5000 UT) PO CAPS: 1.0000 | ORAL_CAPSULE | Freq: Every day | ORAL | 5 refills | Status: DC

## 2017-04-18 MED ORDER — GNP CALCIUM 1200 1200-1000 MG-UNIT PO CHEW
1200.0000 mg | CHEWABLE_TABLET | Freq: Every day | ORAL | 5 refills | Status: DC
Start: 2017-04-18 — End: 2017-06-15

## 2017-04-18 MED ORDER — METHOCARBAMOL 750 MG PO TABS: 750.0000 mg | ORAL_TABLET | Freq: Three times a day (TID) | ORAL | 0 refills | Status: DC | PRN

## 2017-04-18 MED ORDER — ERGOCALCIFEROL 1.25 MG (50000 UT) PO CAPS: 50000.0000 [IU] | ORAL_CAPSULE | ORAL | 0 refills | Status: AC

## 2017-04-18 MED ORDER — MAGNESIUM 500 MG PO CAPS: 500.0000 mg | ORAL_CAPSULE | Freq: Two times a day (BID) | ORAL | 5 refills | Status: DC

## 2017-04-18 NOTE — Progress Notes (Signed)
Safety precautions to be maintained throughout the outpatient stay will include: orient to surroundings, keep bed in low position, maintain call bell within reach at all times, provide assistance with transfer out of bed and ambulation.  

## 2017-04-18 NOTE — Progress Notes (Signed)
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 Test(s): Deferred         Specialized Test(s): Deferred          Thoracic Spine Area Exam  Skin & Axial Inspection: No masses, redness, or swelling Alignment: Symmetrical Functional ROM: Unrestricted ROM Stability: No instability detected Muscle Tone/Strength: Functionally intact. No obvious neuro-muscular anomalies detected. Sensory (Neurological): Unimpaired Muscle strength & Tone: No palpable anomalies  Lumbar Spine Area Exam  Skin & Axial Inspection: No masses, redness, or swelling Alignment: Symmetrical Functional ROM: Decreased ROM      Stability: No instability detected Muscle Tone/Strength: Functionally intact. No obvious neuro-muscular anomalies detected. Sensory (Neurological): Movement-associated pain Palpation: Complains of area being tender to palpation       Provocative Tests: Lumbar Hyperextension and  rotation test: Positive bilaterally for facet joint pain. Lumbar Lateral bending test: evaluation deferred today       Patrick's Maneuver: evaluation deferred today                    Gait & Posture Assessment  Ambulation: Limited Gait: Modified gait pattern (slower gait speed, wider stride width, and longer stance duration) associated with morbid obesity Posture: WNL   Lower Extremity Exam    Side: Right lower extremity  Side: Left lower extremity  Skin & Extremity Inspection: Skin color, temperature, and hair growth are WNL. No peripheral edema or cyanosis. No masses, redness, swelling, asymmetry, or associated skin lesions. No contractures.  Skin & Extremity Inspection: Skin color, temperature, and hair growth are WNL. No peripheral edema or cyanosis. No masses, redness, swelling, asymmetry, or associated skin lesions. No contractures.  Functional ROM: Unrestricted ROM          Functional ROM: Unrestricted ROM          Muscle Tone/Strength: Functionally intact. No obvious neuro-muscular anomalies detected.  Muscle Tone/Strength: Functionally intact. No obvious neuro-muscular anomalies detected.  Sensory (Neurological): Unimpaired  Sensory (Neurological): Unimpaired  Palpation: No palpable anomalies  Palpation: No palpable anomalies   Assessment & Plan  Primary Diagnosis & Pertinent Problem List: The primary encounter diagnosis was Chronic low back pain (Primary Area of Pain) (Bilateral) (R>L) with right-sided sciatica. Diagnoses of DDD (degenerative disc disease), lumbar, Lumbar facet hypertrophy (Bilateral), Lumbar facet syndrome (Bilateral), Spondylosis without myelopathy or radiculopathy, lumbosacral region, Chronic lower extremity pain (Secondary Area of Pain) (Right), Chronic abdominal pain (Tertiary Area of Pain), Chronic upper back pain (Fourth Area of Pain), Other idiopathic scoliosis, thoracic region, Chronic shoulder pain (Bilateral), Osteoarthritis of shoulder (Bilateral),  Osteoarthritis of glenohumeral joint (Bilateral), Osteoarthritis of AC (acromioclavicular) joints (Bilateral), Vitamin D deficiency, Osteoarthritis, and Chronic musculoskeletal pain were also pertinent to this visit.  Visit Diagnosis: 1. Chronic low back  pain (Primary Area of Pain) (Bilateral) (R>L) with right-sided sciatica   2. DDD (degenerative disc disease), lumbar   3. Lumbar facet hypertrophy (Bilateral)   4. Lumbar facet syndrome (Bilateral)   5. Spondylosis without myelopathy or radiculopathy, lumbosacral region   6. Chronic lower extremity pain (Secondary Area of Pain) (Right)   7. Chronic abdominal pain [facility] Area of Pain)   8. Chronic upper back pain (Fourth Area of Pain)   9. Other idiopathic scoliosis, thoracic region   10. Chronic shoulder pain (Bilateral)   11. Osteoarthritis of shoulder (Bilateral)   12. Osteoarthritis of glenohumeral joint (Bilateral)   13. Osteoarthritis of AC (acromioclavicular) joints (Bilateral)   14. Vitamin D deficiency   15. Osteoarthritis   16. Chronic musculoskeletal pain    Problems updated and reviewed during this visit: No problems updated.  Plan of Care  Pharmacotherapy (Medications Ordered): Meds ordered this encounter  Medications  . ergocalciferol (VITAMIN D2) 50000 units capsule    Sig: Take 1 capsule (50,000 Units total) by mouth 2 (two) times a week. X 6 weeks.    Dispense:  12 capsule    Refill:  0    Do not add this medication to the electronic "Automatic Refill" notification system. Patient may have prescription filled one day early if pharmacy is closed on scheduled refill date.  . Cholecalciferol (VITAMIN D3) 5000 units CAPS    Sig: Take 1 capsule (5,000 Units total) by mouth daily with breakfast. Take along with calcium and magnesium.    Dispense:  30 capsule    Refill:  5    Do not place medication on "Automatic Refill".  May substitute with similar over-the-counter product.  . Magnesium 500 MG CAPS    Sig: Take 1  capsule (500 mg total) by mouth 2 (two) times daily at 8 am and 10 pm.    Dispense:  60 capsule    Refill:  5    Do not place medication on "Automatic Refill".  The patient may use similar over-the-counter product.  . Calcium Carbonate-Vit D-Min (GNP CALCIUM 1200) 1200-1000 MG-UNIT CHEW    Sig: Chew 1,200 mg by mouth daily with breakfast. Take in combination with vitamin D and magnesium.    Dispense:  30 tablet    Refill:  5    Do not place medication on "Automatic Refill".  May substitute with similar over-the-counter product.  . meloxicam (MOBIC) 15 MG tablet    Sig: Take 1 tablet (15 mg total) by mouth daily.    Dispense:  30 tablet    Refill:  0    Do not add this medication to the electronic "Automatic Refill" notification system. Patient may have prescription filled one day early if pharmacy is closed on scheduled refill date.  . methocarbamol (ROBAXIN) 750 MG tablet    Sig: Take 1 tablet (750 mg total) by mouth every 8 (eight) hours as needed for muscle spasms.    Dispense:  90 tablet    Refill:  0    Do not place this medication, or any other prescription from our practice, on "Automatic Refill". Patient may have prescription filled one day early if pharmacy is closed on scheduled refill date.    Procedure Orders     LUMBAR FACET(MEDIAL BRANCH NERVE BLOCK) MB[facility] Lab Orders  No laboratory test(s) ordered today   Imaging Orders  No imaging studies ordered today   Referral Orders  No referral(s) requested today    Pharmacological management options:  Opioid Analgesics: We'll  take over management today. See above orders Membrane stabilizer: We have discussed the possibility of optimizing this mode of therapy, if tolerated Muscle relaxant: We have discussed the possibility of a trial NSAID: We have discussed the possibility of a trial Other analgesic(s): To be determined at a later time   Interventional management options: Planned, scheduled, and/or pending:    Diagnostic  bilateral lumbar facet nerve block #1 under fluoroscopic guidance and IV sedation   Considering:   Diagnostic right-sided LESI  Diagnostic bilateral lumbar facet nerve block  Possible bilateral lumbar facet RFA  Trigger point injection    PRN Procedures:   None at this time   Provider-requested follow-up: Return for Procedure (w/ sedation): (B) L-FCT BLK #1.  Future Appointments  Date Time Provider [place]  05/09/2017  9:30 AM Dickie La, MD [facility]    Primary Care Physician: Dickie La, MD Location: [facility] Note by: Gaspar Cola, MD Date: 04/18/2017; Time: 11:33 AM

## 2017-04-18 NOTE — Patient Instructions (Signed)
____________________________________________________________________________________________  Preparing for Procedure with Sedation  Instructions: . Oral Intake: Do not eat or drink anything for at least 8 hours prior to your procedure. . Transportation: Public transportation is not allowed. Bring an adult driver. The driver must be physically present in our waiting room before any procedure can be started. Marland Kitchen Physical Assistance: Bring an adult physically capable of assisting you, in the event you need help. This adult should keep you company at home for at least 6 hours after the procedure. . Blood Pressure Medicine: Take your blood pressure medicine with a sip of water the morning of the procedure. . Blood thinners:  . Diabetics on insulin: Notify the staff so that you can be scheduled 1st case in the morning. If your diabetes requires high dose insulin, take only  of your normal insulin dose the morning of the procedure and notify the staff that you have done so. . Preventing infections: Shower with an antibacterial soap the morning of your procedure. . Build-up your immune system: Take 1000 mg of Vitamin C with every meal (3 times a day) the day prior to your procedure. Marland Kitchen Antibiotics: Inform the staff if you have a condition or reason that requires you to take antibiotics before dental procedures. . Pregnancy: If you are pregnant, call and cancel the procedure. . Sickness: If you have a cold, fever, or any active infections, call and cancel the procedure. . Arrival: You must be in the facility at least 30 minutes prior to your scheduled procedure. . Children: Do not bring children with you. . Dress appropriately: Bring dark clothing that you would not mind if they get stained. . Valuables: Do not bring any jewelry or valuables.  Procedure appointments are reserved for interventional treatments only. Marland Kitchen No Prescription Refills. . No medication changes will be discussed during procedure  appointments. . No disability issues will be discussed.  Remember:  Regular Business hours are:  Monday to Thursday 8:00 AM to 4:00 PM  Provider's Schedule: Milinda Pointer, MD:  Procedure days: Tuesday and Thursday 7:30 AM to 4:00 PM  Gillis Santa, MD:  Procedure days: Monday and Wednesday 7:30 AM to 4:00 PM ____________________________________________________________________________________________   ____________________________________________________________________________________________  Pain Scale  Introduction: The pain score used by this practice is the Verbal Numerical Rating Scale (VNRS-11). This is an 11-point scale. It is for adults and children 10 years or older. There are significant differences in how the pain score is reported, used, and applied. Forget everything you learned in the past and learn this scoring system.  General Information: The scale should reflect your current level of pain. Unless you are specifically asked for the level of your worst pain, or your average pain. If you are asked for one of these two, then it should be understood that it is over the past 24 hours.  Basic Activities of Daily Living (ADL): Personal hygiene, dressing, eating, transferring, and using restroom.  Instructions: Most patients tend to report their level of pain as a combination of two factors, their physical pain and their psychosocial pain. This last one is also known as "suffering" and it is reflection of how physical pain affects you socially and psychologically. From now on, report them separately. From this point on, when asked to report your pain level, report only your physical pain. Use the following table for reference.  Pain Clinic Pain Levels (0-5/10)  Pain Level Score  Description  No Pain 0   Mild pain 1 Nagging, annoying, but does not interfere  with basic activities of daily living (ADL). Patients are able to eat, bathe, get dressed, toileting (being able to  get on and off the toilet and perform personal hygiene functions), transfer (move in and out of bed or a chair without assistance), and maintain continence (able to control bladder and bowel functions). Blood pressure and heart rate are unaffected. A normal heart rate for a healthy adult ranges from 60 to 100 bpm (beats per minute).   Mild to moderate pain 2 Noticeable and distracting. Impossible to hide from other people. More frequent flare-ups. Still possible to adapt and function close to normal. It can be very annoying and may have occasional stronger flare-ups. With discipline, patients may get used to it and adapt.   Moderate pain 3 Interferes significantly with activities of daily living (ADL). It becomes difficult to feed, bathe, get dressed, get on and off the toilet or to perform personal hygiene functions. Difficult to get in and out of bed or a chair without assistance. Very distracting. With effort, it can be ignored when deeply involved in activities.   Moderately severe pain 4 Impossible to ignore for more than a few minutes. With effort, patients may still be able to manage work or participate in some social activities. Very difficult to concentrate. Signs of autonomic nervous system discharge are evident: dilated pupils (mydriasis); mild sweating (diaphoresis); sleep interference. Heart rate becomes elevated (>115 bpm). Diastolic blood pressure (lower number) rises above 100 mmHg. Patients find relief in laying down and not moving.   Severe pain 5 Intense and extremely unpleasant. Associated with frowning face and frequent crying. Pain overwhelms the senses.  Ability to do any activity or maintain social relationships becomes significantly limited. Conversation becomes difficult. Pacing back and forth is common, as getting into a comfortable position is nearly impossible. Pain wakes you up from deep sleep. Physical signs will be obvious: pupillary dilation; increased sweating; goosebumps;  brisk reflexes; cold, clammy hands and feet; nausea, vomiting or dry heaves; loss of appetite; significant sleep disturbance with inability to fall asleep or to remain asleep. When persistent, significant weight loss is observed due to the complete loss of appetite and sleep deprivation.  Blood pressure and heart rate becomes significantly elevated. Caution: If elevated blood pressure triggers a pounding headache associated with blurred vision, then the patient should immediately seek attention at an urgent or emergency care unit, as these may be signs of an impending stroke.    Emergency Department Pain Levels (6-10/10)  Emergency Room Pain 6 Severely limiting. Requires emergency care and should not be seen or managed at an outpatient pain management facility. Communication becomes difficult and requires great effort. Assistance to reach the emergency department may be required. Facial flushing and profuse sweating along with potentially dangerous increases in heart rate and blood pressure will be evident.   Distressing pain 7 Self-care is very difficult. Assistance is required to transport, or use restroom. Assistance to reach the emergency department will be required. Tasks requiring coordination, such as bathing and getting dressed become very difficult.   Disabling pain 8 Self-care is no longer possible. At this level, pain is disabling. The individual is unable to do even the most "basic" activities such as walking, eating, bathing, dressing, transferring to a bed, or toileting. Fine motor skills are lost. It is difficult to think clearly.   Incapacitating pain 9 Pain becomes incapacitating. Thought processing is no longer possible. Difficult to remember your own name. Control of movement and coordination are lost.  The worst pain imaginable 10 At this level, most patients pass out from pain. When this level is reached, collapse of the autonomic nervous system occurs, leading to a sudden drop in  blood pressure and heart rate. This in turn results in a temporary and dramatic drop in blood flow to the brain, leading to a loss of consciousness. Fainting is one of the body's self defense mechanisms. Passing out puts the brain in a calmed state and causes it to shut down for a while, in order to begin the healing process.    Summary: 1. Refer to this scale when providing [name] with your pain level. 2. Be accurate and careful when reporting your pain level. This will help with your care. 3. Over-reporting your pain level will lead to loss of credibility. 4. Even a level of 1/10 means that there is pain and will be treated at our facility. 5. High, inaccurate reporting will be documented as "Symptom Exaggeration", leading to loss of credibility and suspicions of possible secondary gains such as obtaining more narcotics, or wanting to appear disabled, for fraudulent reasons. 6. Only pain levels of 5 or below will be seen at our facility. 7. Pain levels of 6 and above will be sent to the Emergency Department and the appointment cancelled. ____________________________________________________________________________________________

## 2017-05-08 ENCOUNTER — Telehealth: Payer: Self-pay | Admitting: *Deleted

## 2017-05-08 NOTE — Telephone Encounter (Signed)
Instructed patient that she could have procedure without sedation and still use public transportation as long as someone was at her home when she is transported back to ensure that she gets off the [place] safely. Patient understands and withes to go ahead and with procedure without sedation. Instructions given. Patient to call and make appointment after she arranges transportation.

## 2017-05-09 ENCOUNTER — Ambulatory Visit: Payer: Medicaid Other | Admitting: Family Medicine

## 2017-05-18 ENCOUNTER — Telehealth: Payer: Self-pay

## 2017-05-18 ENCOUNTER — Other Ambulatory Visit: Payer: Self-pay

## 2017-05-18 NOTE — Telephone Encounter (Signed)
Patient left message on nurse line asking for PCP or assistant to call to discuss a letter she needs written for DSS. Left number of [phone].  Danley Danker, RN [facility] RN)

## 2017-05-21 NOTE — Telephone Encounter (Signed)
Spoke with pt. Pt is having a procedure at the [facility]. They are sedating her to put steroids in her back. She needs a letter saying that she needs to have someone with her during and after this procedure and this person will need to ride with her on her medicaid transportation. If you will write the letter, please fax to [facility] transportation @ [phone].  Pt would also like refills of her proair and Symbicort sent to [employer]. [name], CMA

## 2017-05-21 NOTE — Telephone Encounter (Signed)
Pt is calling back to check on the status of getting a letter written for DSS. She needs to be contacted ASAP at her new number [phone].

## 2017-05-21 NOTE — Telephone Encounter (Signed)
Dear [name] Team I do not know what letter she is wanting. Can you find out? THANKS! Holly Mendez

## 2017-05-22 ENCOUNTER — Encounter: Payer: Self-pay | Admitting: Family Medicine

## 2017-05-22 MED ORDER — BUDESONIDE-FORMOTEROL FUMARATE 80-4.5 MCG/ACT IN AERO: INHALATION_SPRAY | RESPIRATORY_TRACT | 2 refills | Status: DC

## 2017-05-22 MED ORDER — ALBUTEROL SULFATE HFA 108 (90 BASE) MCG/ACT IN AERS: INHALATION_SPRAY | RESPIRATORY_TRACT | 12 refills | Status: DC

## 2017-05-22 NOTE — Telephone Encounter (Signed)
Pt informed. Campbell Agramonte T Elva Mauro, CMA  

## 2017-05-22 NOTE — Telephone Encounter (Signed)
Dear [name] Team OK thanks for the info. I have faxed the letter. Please let her know THANKS! [name]

## 2017-05-29 ENCOUNTER — Other Ambulatory Visit: Payer: Self-pay | Admitting: Family Medicine

## 2017-05-31 ENCOUNTER — Ambulatory Visit: Payer: Medicaid Other | Admitting: Pain Medicine

## 2017-06-05 ENCOUNTER — Other Ambulatory Visit: Payer: Self-pay | Admitting: Family Medicine

## 2017-06-06 NOTE — Telephone Encounter (Signed)
Covering for Dr. Nori Riis. Unclear to me why patient needs this medication - not on her current medication list. Will not refill at this time.  Leeanne Rio, MD

## 2017-06-11 ENCOUNTER — Other Ambulatory Visit: Payer: Self-pay

## 2017-06-12 ENCOUNTER — Other Ambulatory Visit: Payer: Self-pay

## 2017-06-12 ENCOUNTER — Ambulatory Visit: Payer: Medicaid Other | Admitting: Pain Medicine

## 2017-06-14 ENCOUNTER — Other Ambulatory Visit: Payer: Self-pay

## 2017-06-15 ENCOUNTER — Emergency Department (HOSPITAL_COMMUNITY): Payer: Medicaid Other

## 2017-06-15 ENCOUNTER — Encounter (HOSPITAL_COMMUNITY): Payer: Self-pay | Admitting: Emergency Medicine

## 2017-06-15 ENCOUNTER — Emergency Department (HOSPITAL_COMMUNITY)
Admission: EM | Admit: 2017-06-15 | Discharge: 2017-06-15 | Disposition: A | Discharge status: Home / Self Care | Payer: Medicaid Other | Attending: Emergency Medicine | Admitting: Emergency Medicine

## 2017-06-15 DIAGNOSIS — K5900 Constipation, unspecified: Secondary | ICD-10-CM | POA: Insufficient documentation

## 2017-06-15 DIAGNOSIS — R112 Nausea with vomiting, unspecified: Secondary | ICD-10-CM | POA: Diagnosis not present

## 2017-06-15 DIAGNOSIS — Z79899 Other long term (current) drug therapy: Secondary | ICD-10-CM | POA: Diagnosis not present

## 2017-06-15 DIAGNOSIS — Z87891 Personal history of nicotine dependence: Secondary | ICD-10-CM | POA: Insufficient documentation

## 2017-06-15 LAB — POC URINE PREG, ED: Preg Test, Ur: NEGATIVE

## 2017-06-15 MED ORDER — ONDANSETRON 8 MG PO TBDP
8.0000 mg | ORAL_TABLET | Freq: Once | ORAL | Status: AC
Start: 2017-06-15 — End: 2017-06-15
  Administered 2017-06-15: 8 mg via ORAL
  Filled 2017-06-15: qty 1

## 2017-06-15 MED ORDER — POLYETHYLENE GLYCOL 3350 17 G PO PACK: PACK | ORAL | 0 refills | Status: DC

## 2017-06-15 NOTE — ED Triage Notes (Addendum)
Pt from home with c/o constipation x 11 days. Pt also has nausea and emesis. Pt attempted to digitally disimpact herself and when doing so noticed bright red blood. Pt states she has hx of fecal impaction. On call provider at PCP recommended an enema. Pt did so with minimal relief. No bowel sounds audible
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
## 2017-06-16 ENCOUNTER — Telehealth: Payer: Self-pay | Admitting: Family Medicine

## 2017-06-16 ENCOUNTER — Emergency Department (HOSPITAL_COMMUNITY)
Admission: EM | Admit: 2017-06-16 | Discharge: 2017-06-17 | Disposition: A | Discharge status: Home / Self Care | Payer: Medicaid Other | Source: Home / Self Care | Attending: Emergency Medicine | Admitting: Emergency Medicine

## 2017-06-16 ENCOUNTER — Encounter (HOSPITAL_COMMUNITY): Payer: Self-pay | Admitting: Emergency Medicine

## 2017-06-16 DIAGNOSIS — R102 Pelvic and perineal pain unspecified side: Secondary | ICD-10-CM

## 2017-06-16 DIAGNOSIS — D259 Leiomyoma of uterus, unspecified: Secondary | ICD-10-CM | POA: Insufficient documentation

## 2017-06-16 DIAGNOSIS — Z87891 Personal history of nicotine dependence: Secondary | ICD-10-CM

## 2017-06-16 DIAGNOSIS — I10 Essential (primary) hypertension: Secondary | ICD-10-CM

## 2017-06-16 DIAGNOSIS — J45909 Unspecified asthma, uncomplicated: Secondary | ICD-10-CM

## 2017-06-16 DIAGNOSIS — Z79899 Other long term (current) drug therapy: Secondary | ICD-10-CM

## 2017-06-16 DIAGNOSIS — K5900 Constipation, unspecified: Secondary | ICD-10-CM | POA: Insufficient documentation

## 2017-06-16 LAB — CBC
HCT: 41.2 % (ref 36.0–46.0)
HEMOGLOBIN: 13.3 g/dL (ref 12.0–15.0)
MCH: 27.3 pg (ref 26.0–34.0)
MCHC: 32.3 g/dL (ref 30.0–36.0)
MCV: 84.4 fL (ref 78.0–100.0)
Platelets: 303 10*3/uL (ref 150–400)
RBC: 4.88 MIL/uL (ref 3.87–5.11)
RDW: 13.9 % (ref 11.5–15.5)
WBC: 9.7 10*3/uL (ref 4.0–10.5)

## 2017-06-16 LAB — COMPREHENSIVE METABOLIC PANEL
ALK PHOS: 80 U/L (ref 38–126)
ALT: 27 U/L (ref 14–54)
ANION GAP: 10 (ref 5–15)
AST: 31 U/L (ref 15–41)
Albumin: 4.1 g/dL (ref 3.5–5.0)
BILIRUBIN TOTAL: 0.6 mg/dL (ref 0.3–1.2)
BUN: 10 mg/dL (ref 6–20)
CALCIUM: 9.5 mg/dL (ref 8.9–10.3)
CO2: 24 mmol/L (ref 22–32)
Chloride: 102 mmol/L (ref 101–111)
Creatinine, Ser: 1.1 mg/dL — ABNORMAL HIGH (ref 0.44–1.00)
GFR calc Af Amer: >60 mL/min (ref >60)
GFR calc non Af Amer: >60 mL/min (ref >60)
Glucose, Bld: 116 mg/dL — ABNORMAL HIGH (ref 65–99)
POTASSIUM: 3.8 mmol/L (ref 3.5–5.1)
SODIUM: 136 mmol/L (ref 135–145)
TOTAL PROTEIN: 8 g/dL (ref 6.5–8.1)

## 2017-06-16 LAB — LIPASE, BLOOD: Lipase: 25 U/L (ref 11–51)

## 2017-06-16 LAB — I-STAT BETA HCG BLOOD, ED (MC, WL, AP ONLY)

## 2017-06-16 MED ORDER — ONDANSETRON 4 MG PO TBDP: 4.0000 mg | ORAL_TABLET | Freq: Once | ORAL | Status: DC | PRN

## 2017-06-16 NOTE — Telephone Encounter (Signed)
After Hours/Emergency Line Call  Received call from patient regarding the umbilical abdominal pain radiating to buttocks bilaterally and nausea with vomiting.  Patient has history of chronic constipation continues to be followed by Dr. Benson Norway with GI.  Patients the patient has improved over the past 2 years she has not required GI follow-up.  She was admitted back in 2017 for abdominal pain found to have acute pancreatitis.  As of recently, patient was seen 06/15/2017 for similar pain and was given soapsuds enema due to concern for impaction.  Patient was able to pass stool.  She was discharged with MiraLAX and instructed to take daily.  Patient unable to keep down print juice and sandwich today.  She also tried MiraLAX along with another enema but was unable to pass any volume of stool.  Patient states she is passing gas.  She denies fevers or chills, chest pain, shortness of breath, dysuria, urinary frequency, hematuria, hematochezia, melena.  Advised patient to go to ED to be evaluated.  Patient requested to try another enema in consume small portions of soft food and liquid.  Patient states her husband would take her to the emergency room if she did not pass stool and abdominal pain did not improve.  Reviewed red flags.  Patient without further questions or concerns.  Will forward to PCP.  Harriet [place]te, [place], PGY-2

## 2017-06-16 NOTE — ED Triage Notes (Signed)
Pt arrives via EMS from home for nausea vomiting, constipation x2 weeks. Seen yesterday for same and given RX for miralax. Reports taking zofran at home.

## 2017-06-17 ENCOUNTER — Other Ambulatory Visit: Payer: Self-pay

## 2017-06-17 ENCOUNTER — Inpatient Hospital Stay (HOSPITAL_COMMUNITY)
Admission: EM | Admit: 2017-06-17 | Discharge: 2017-06-19 | DRG: 392 | Disposition: A | Discharge status: Home / Self Care | Payer: Medicaid Other | Attending: Family Medicine | Admitting: Family Medicine

## 2017-06-17 ENCOUNTER — Emergency Department (HOSPITAL_COMMUNITY): Payer: Medicaid Other

## 2017-06-17 ENCOUNTER — Encounter (HOSPITAL_COMMUNITY): Payer: Self-pay | Admitting: Emergency Medicine

## 2017-06-17 DIAGNOSIS — J45909 Unspecified asthma, uncomplicated: Secondary | ICD-10-CM | POA: Diagnosis present

## 2017-06-17 DIAGNOSIS — R1110 Vomiting, unspecified: Secondary | ICD-10-CM | POA: Diagnosis present

## 2017-06-17 DIAGNOSIS — R000 Tachycardia, unspecified: Secondary | ICD-10-CM | POA: Diagnosis present

## 2017-06-17 DIAGNOSIS — Z888 Allergy status to other drugs, medicaments and biological substances status: Secondary | ICD-10-CM

## 2017-06-17 DIAGNOSIS — G894 Chronic pain syndrome: Secondary | ICD-10-CM

## 2017-06-17 DIAGNOSIS — H5462 Unqualified visual loss, left eye, normal vision right eye: Secondary | ICD-10-CM | POA: Diagnosis present

## 2017-06-17 DIAGNOSIS — M797 Fibromyalgia: Secondary | ICD-10-CM | POA: Diagnosis present

## 2017-06-17 DIAGNOSIS — Z87891 Personal history of nicotine dependence: Secondary | ICD-10-CM

## 2017-06-17 DIAGNOSIS — M5136 Other intervertebral disc degeneration, lumbar region: Secondary | ICD-10-CM | POA: Diagnosis present

## 2017-06-17 DIAGNOSIS — Z881 Allergy status to other antibiotic agents status: Secondary | ICD-10-CM

## 2017-06-17 DIAGNOSIS — E559 Vitamin D deficiency, unspecified: Secondary | ICD-10-CM | POA: Diagnosis present

## 2017-06-17 DIAGNOSIS — F319 Bipolar disorder, unspecified: Secondary | ICD-10-CM | POA: Diagnosis present

## 2017-06-17 DIAGNOSIS — Z970 Presence of artificial eye: Secondary | ICD-10-CM

## 2017-06-17 DIAGNOSIS — K219 Gastro-esophageal reflux disease without esophagitis: Secondary | ICD-10-CM | POA: Diagnosis present

## 2017-06-17 DIAGNOSIS — Z79899 Other long term (current) drug therapy: Secondary | ICD-10-CM

## 2017-06-17 DIAGNOSIS — Z7951 Long term (current) use of inhaled steroids: Secondary | ICD-10-CM

## 2017-06-17 DIAGNOSIS — K5900 Constipation, unspecified: Secondary | ICD-10-CM | POA: Diagnosis present

## 2017-06-17 DIAGNOSIS — D259 Leiomyoma of uterus, unspecified: Secondary | ICD-10-CM | POA: Diagnosis present

## 2017-06-17 DIAGNOSIS — Z791 Long term (current) use of non-steroidal anti-inflammatories (NSAID): Secondary | ICD-10-CM

## 2017-06-17 DIAGNOSIS — M19011 Primary osteoarthritis, right shoulder: Secondary | ICD-10-CM | POA: Diagnosis present

## 2017-06-17 DIAGNOSIS — F259 Schizoaffective disorder, unspecified: Secondary | ICD-10-CM | POA: Diagnosis present

## 2017-06-17 DIAGNOSIS — Z6841 Body Mass Index (BMI) 40.0 and over, adult: Secondary | ICD-10-CM

## 2017-06-17 DIAGNOSIS — E7800 Pure hypercholesterolemia, unspecified: Secondary | ICD-10-CM | POA: Diagnosis present

## 2017-06-17 DIAGNOSIS — M4124 Other idiopathic scoliosis, thoracic region: Secondary | ICD-10-CM | POA: Diagnosis present

## 2017-06-17 DIAGNOSIS — I10 Essential (primary) hypertension: Secondary | ICD-10-CM | POA: Diagnosis present

## 2017-06-17 DIAGNOSIS — K449 Diaphragmatic hernia without obstruction or gangrene: Secondary | ICD-10-CM | POA: Diagnosis present

## 2017-06-17 DIAGNOSIS — R112 Nausea with vomiting, unspecified: Principal | ICD-10-CM | POA: Diagnosis present

## 2017-06-17 DIAGNOSIS — N760 Acute vaginitis: Secondary | ICD-10-CM | POA: Diagnosis present

## 2017-06-17 DIAGNOSIS — M19012 Primary osteoarthritis, left shoulder: Secondary | ICD-10-CM | POA: Diagnosis present

## 2017-06-17 DIAGNOSIS — Z8661 Personal history of infections of the central nervous system: Secondary | ICD-10-CM

## 2017-06-17 DIAGNOSIS — M5030 Other cervical disc degeneration, unspecified cervical region: Secondary | ICD-10-CM | POA: Diagnosis present

## 2017-06-17 LAB — COMPREHENSIVE METABOLIC PANEL
ALT: 27 U/L (ref 14–54)
AST: 29 U/L (ref 15–41)
Albumin: 4.1 g/dL (ref 3.5–5.0)
Alkaline Phosphatase: 80 U/L (ref 38–126)
Anion gap: 11 (ref 5–15)
BUN: 9 mg/dL (ref 6–20)
CO2: 24 mmol/L (ref 22–32)
Calcium: 9 mg/dL (ref 8.9–10.3)
Chloride: 103 mmol/L (ref 101–111)
Creatinine, Ser: 1.08 mg/dL — ABNORMAL HIGH (ref 0.44–1.00)
GFR calc Af Amer: >60 mL/min (ref >60)
GFR calc non Af Amer: >60 mL/min (ref >60)
Glucose, Bld: 139 mg/dL — ABNORMAL HIGH (ref 65–99)
Potassium: 3.7 mmol/L (ref 3.5–5.1)
Sodium: 138 mmol/L (ref 135–145)
Total Bilirubin: 0.7 mg/dL (ref 0.3–1.2)
Total Protein: 7.7 g/dL (ref 6.5–8.1)

## 2017-06-17 LAB — CBC WITH DIFFERENTIAL/PLATELET
Abs Immature Granulocytes: 0 10*3/uL (ref 0.0–0.1)
Basophils Absolute: 0 10*3/uL (ref 0.0–0.1)
Basophils Relative: 0 %
Eosinophils Absolute: 0 10*3/uL (ref 0.0–0.7)
Eosinophils Relative: 0 %
HCT: 40.7 % (ref 36.0–46.0)
Hemoglobin: 13.2 g/dL (ref 12.0–15.0)
Immature Granulocytes: 0 %
Lymphocytes Relative: 27 %
Lymphs Abs: 2.4 10*3/uL (ref 0.7–4.0)
MCH: 27.7 pg (ref 26.0–34.0)
MCHC: 32.4 g/dL (ref 30.0–36.0)
MCV: 85.3 fL (ref 78.0–100.0)
Monocytes Absolute: 0.7 10*3/uL (ref 0.1–1.0)
Monocytes Relative: 8 %
Neutro Abs: 5.9 10*3/uL (ref 1.7–7.7)
Neutrophils Relative %: 65 %
Platelets: 292 10*3/uL (ref 150–400)
RBC: 4.77 MIL/uL (ref 3.87–5.11)
RDW: 13.9 % (ref 11.5–15.5)
WBC: 9 10*3/uL (ref 4.0–10.5)

## 2017-06-17 LAB — WET PREP, GENITAL
SPERM: NONE SEEN
TRICH WET PREP: NONE SEEN
WBC, Wet Prep HPF POC: NONE SEEN
YEAST WET PREP: NONE SEEN

## 2017-06-17 LAB — LIPASE, BLOOD: Lipase: 55 U/L — ABNORMAL HIGH (ref 11–51)

## 2017-06-17 MED ORDER — LACTATED RINGERS IV BOLUS: 500.0000 mL | Freq: Once | INTRAVENOUS | Status: DC

## 2017-06-17 MED ORDER — SODIUM CHLORIDE 0.9 % IV BOLUS
1000.0000 mL | Freq: Once | INTRAVENOUS | Status: AC
  Administered 2017-06-17: 1000 mL via INTRAVENOUS

## 2017-06-17 MED ORDER — METOCLOPRAMIDE HCL 5 MG/ML IJ SOLN
10.0000 mg | Freq: Once | INTRAMUSCULAR | Status: AC
  Administered 2017-06-17: 10 mg via INTRAVENOUS
  Filled 2017-06-17: qty 2

## 2017-06-17 MED ORDER — KETOROLAC TROMETHAMINE 30 MG/ML IJ SOLN
30.0000 mg | Freq: Once | INTRAMUSCULAR | Status: AC
  Administered 2017-06-17: 30 mg via INTRAMUSCULAR
  Filled 2017-06-17: qty 1

## 2017-06-17 MED ORDER — LORAZEPAM 2 MG/ML IJ SOLN
1.0000 mg | Freq: Once | INTRAMUSCULAR | Status: AC
  Administered 2017-06-17: 1 mg via INTRAVENOUS
  Filled 2017-06-17: qty 1

## 2017-06-17 MED ORDER — HALOPERIDOL LACTATE 5 MG/ML IJ SOLN
2.0000 mg | Freq: Once | INTRAMUSCULAR | Status: AC
  Administered 2017-06-17: 2 mg via INTRAVENOUS
  Filled 2017-06-17: qty 1

## 2017-06-17 MED ORDER — IOHEXOL 300 MG/ML  SOLN
100.0000 mL | Freq: Once | INTRAMUSCULAR | Status: AC | PRN
  Administered 2017-06-17: 100 mL via INTRAVENOUS

## 2017-06-17 MED ORDER — ONDANSETRON 4 MG PO TBDP: 4.0000 mg | ORAL_TABLET | Freq: Three times a day (TID) | ORAL | 0 refills | Status: DC | PRN

## 2017-06-17 MED ORDER — PROMETHAZINE HCL 25 MG/ML IJ SOLN
12.5000 mg | Freq: Once | INTRAMUSCULAR | Status: AC
  Administered 2017-06-17: 12.5 mg via INTRAVENOUS
  Filled 2017-06-17: qty 1

## 2017-06-17 MED ORDER — ONDANSETRON HCL 4 MG/2ML IJ SOLN
4.0000 mg | Freq: Once | INTRAMUSCULAR | Status: AC
  Administered 2017-06-17: 4 mg via INTRAVENOUS
  Filled 2017-06-17: qty 2

## 2017-06-17 MED ORDER — NAPROXEN 500 MG PO TABS: 500.0000 mg | ORAL_TABLET | Freq: Two times a day (BID) | ORAL | 0 refills | Status: DC

## 2017-06-17 NOTE — ED Notes (Signed)
Pt asleep in room. Upon entering and waking pt, she looked at this RN and then went back to sleep.
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
## 2017-06-17 NOTE — Discharge Instructions (Addendum)
You were seen today for abdominal pain and constipation.  Your CT scan does not show any significant constipation.  You may continue MiraLAX.  Your CT scan does show a fibroid uterus.  Given the location of your pain, fibroids, can sometimes cause pain in the pelvis.  Follow-up closely with OB/GYN as an outpatient.  Take naproxen as needed for pain as well as Zofran for nausea and vomiting.
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
## 2017-06-17 NOTE — ED Notes (Signed)
Pt given orange juice to try for PO challenge.

## 2017-06-17 NOTE — ED Notes (Signed)
Pt range the call bell and upon entering room, pt was vomiting.

## 2017-06-17 NOTE — ED Triage Notes (Addendum)
Report from [facility]> pt discharged from ED this morning for lower abd pain.  Diagnosed with uterine fibroid.  Pt also seen for constipation and is taking miralax for same.  Given Rx for Naproxen and Zofran.  Reports continued nausea and vomiting.  EMS administered Zofran 4mg  IV- pt vomiting on arrival.

## 2017-06-18 DIAGNOSIS — Z7951 Long term (current) use of inhaled steroids: Secondary | ICD-10-CM | POA: Diagnosis not present

## 2017-06-18 DIAGNOSIS — Z79899 Other long term (current) drug therapy: Secondary | ICD-10-CM | POA: Diagnosis not present

## 2017-06-18 DIAGNOSIS — Z6841 Body Mass Index (BMI) 40.0 and over, adult: Secondary | ICD-10-CM | POA: Diagnosis not present

## 2017-06-18 DIAGNOSIS — M19012 Primary osteoarthritis, left shoulder: Secondary | ICD-10-CM | POA: Diagnosis present

## 2017-06-18 DIAGNOSIS — E559 Vitamin D deficiency, unspecified: Secondary | ICD-10-CM | POA: Diagnosis present

## 2017-06-18 DIAGNOSIS — I10 Essential (primary) hypertension: Secondary | ICD-10-CM | POA: Diagnosis present

## 2017-06-18 DIAGNOSIS — M5030 Other cervical disc degeneration, unspecified cervical region: Secondary | ICD-10-CM | POA: Diagnosis present

## 2017-06-18 DIAGNOSIS — Z87891 Personal history of nicotine dependence: Secondary | ICD-10-CM | POA: Diagnosis not present

## 2017-06-18 DIAGNOSIS — Z791 Long term (current) use of non-steroidal anti-inflammatories (NSAID): Secondary | ICD-10-CM | POA: Diagnosis not present

## 2017-06-18 DIAGNOSIS — R1110 Vomiting, unspecified: Secondary | ICD-10-CM | POA: Diagnosis present

## 2017-06-18 DIAGNOSIS — K219 Gastro-esophageal reflux disease without esophagitis: Secondary | ICD-10-CM | POA: Diagnosis present

## 2017-06-18 DIAGNOSIS — N760 Acute vaginitis: Secondary | ICD-10-CM | POA: Diagnosis present

## 2017-06-18 DIAGNOSIS — H5462 Unqualified visual loss, left eye, normal vision right eye: Secondary | ICD-10-CM | POA: Diagnosis present

## 2017-06-18 DIAGNOSIS — F319 Bipolar disorder, unspecified: Secondary | ICD-10-CM | POA: Diagnosis present

## 2017-06-18 DIAGNOSIS — R000 Tachycardia, unspecified: Secondary | ICD-10-CM | POA: Diagnosis present

## 2017-06-18 DIAGNOSIS — Z888 Allergy status to other drugs, medicaments and biological substances status: Secondary | ICD-10-CM | POA: Diagnosis not present

## 2017-06-18 DIAGNOSIS — M4124 Other idiopathic scoliosis, thoracic region: Secondary | ICD-10-CM | POA: Diagnosis present

## 2017-06-18 DIAGNOSIS — G894 Chronic pain syndrome: Secondary | ICD-10-CM | POA: Diagnosis present

## 2017-06-18 DIAGNOSIS — R112 Nausea with vomiting, unspecified: Secondary | ICD-10-CM | POA: Diagnosis present

## 2017-06-18 DIAGNOSIS — M797 Fibromyalgia: Secondary | ICD-10-CM | POA: Diagnosis present

## 2017-06-18 DIAGNOSIS — M19011 Primary osteoarthritis, right shoulder: Secondary | ICD-10-CM | POA: Diagnosis present

## 2017-06-18 DIAGNOSIS — J45909 Unspecified asthma, uncomplicated: Secondary | ICD-10-CM | POA: Diagnosis present

## 2017-06-18 DIAGNOSIS — M5136 Other intervertebral disc degeneration, lumbar region: Secondary | ICD-10-CM | POA: Diagnosis present

## 2017-06-18 DIAGNOSIS — Z881 Allergy status to other antibiotic agents status: Secondary | ICD-10-CM | POA: Diagnosis not present

## 2017-06-18 LAB — CBC
HCT: 37.8 % (ref 36.0–46.0)
HEMOGLOBIN: 12.5 g/dL (ref 12.0–15.0)
MCH: 27.7 pg (ref 26.0–34.0)
MCHC: 33.1 g/dL (ref 30.0–36.0)
MCV: 83.6 fL (ref 78.0–100.0)
Platelets: 307 10*3/uL (ref 150–400)
RBC: 4.52 MIL/uL (ref 3.87–5.11)
RDW: 14 % (ref 11.5–15.5)
WBC: 10.5 10*3/uL (ref 4.0–10.5)

## 2017-06-18 LAB — URINALYSIS, ROUTINE W REFLEX MICROSCOPIC
BILIRUBIN URINE: NEGATIVE
GLUCOSE, UA: NEGATIVE mg/dL
HGB URINE DIPSTICK: NEGATIVE
Ketones, ur: 5 mg/dL — AB
Leukocytes, UA: NEGATIVE
Nitrite: NEGATIVE
PH: 6 (ref 5.0–8.0)
Protein, ur: NEGATIVE mg/dL
SPECIFIC GRAVITY, URINE: 1.02 (ref 1.005–1.030)

## 2017-06-18 LAB — COMPREHENSIVE METABOLIC PANEL
ALT: 26 U/L (ref 14–54)
ANION GAP: 9 (ref 5–15)
AST: 25 U/L (ref 15–41)
Albumin: 3.9 g/dL (ref 3.5–5.0)
Alkaline Phosphatase: 74 U/L (ref 38–126)
BUN: 5 mg/dL — ABNORMAL LOW (ref 6–20)
CALCIUM: 8.8 mg/dL — AB (ref 8.9–10.3)
CHLORIDE: 104 mmol/L (ref 101–111)
CO2: 25 mmol/L (ref 22–32)
Creatinine, Ser: 0.81 mg/dL (ref 0.44–1.00)
GFR calc non Af Amer: >60 mL/min (ref >60)
Glucose, Bld: 161 mg/dL — ABNORMAL HIGH (ref 65–99)
POTASSIUM: 3.8 mmol/L (ref 3.5–5.1)
SODIUM: 138 mmol/L (ref 135–145)
Total Bilirubin: 0.6 mg/dL (ref 0.3–1.2)
Total Protein: 7.8 g/dL (ref 6.5–8.1)

## 2017-06-18 LAB — GC/CHLAMYDIA PROBE AMP (~~LOC~~) NOT AT ARMC
Chlamydia: NEGATIVE
Neisseria Gonorrhea: NEGATIVE

## 2017-06-18 LAB — HIV ANTIBODY (ROUTINE TESTING W REFLEX): HIV SCREEN 4TH GENERATION: NONREACTIVE

## 2017-06-18 MED ORDER — CARBAMAZEPINE 200 MG PO TABS
200.0000 mg | ORAL_TABLET | Freq: Two times a day (BID) | ORAL | Status: DC
  Administered 2017-06-18 – 2017-06-19 (×4): 200 mg via ORAL
  Filled 2017-06-18 (×4): qty 1

## 2017-06-18 MED ORDER — ARIPIPRAZOLE 10 MG PO TABS
10.0000 mg | ORAL_TABLET | Freq: Every day | ORAL | Status: DC
  Administered 2017-06-18 – 2017-06-19 (×2): 10 mg via ORAL
  Filled 2017-06-18 (×2): qty 1

## 2017-06-18 MED ORDER — ENOXAPARIN SODIUM 40 MG/0.4ML ~~LOC~~ SOLN
40.0000 mg | SUBCUTANEOUS | Status: DC
  Administered 2017-06-18: 40 mg via SUBCUTANEOUS
  Filled 2017-06-18: qty 0.4

## 2017-06-18 MED ORDER — PROMETHAZINE HCL 25 MG/ML IJ SOLN
12.5000 mg | Freq: Once | INTRAMUSCULAR | Status: AC
  Administered 2017-06-18: 12.5 mg via INTRAVENOUS
  Filled 2017-06-18: qty 1

## 2017-06-18 MED ORDER — METOCLOPRAMIDE HCL 5 MG/ML IJ SOLN
10.0000 mg | Freq: Once | INTRAMUSCULAR | Status: AC
  Administered 2017-06-18: 10 mg via INTRAVENOUS
  Filled 2017-06-18: qty 2

## 2017-06-18 MED ORDER — ALBUTEROL SULFATE (2.5 MG/3ML) 0.083% IN NEBU
3.0000 mL | INHALATION_SOLUTION | RESPIRATORY_TRACT | Status: DC | PRN
Start: 2017-06-18 — End: 2017-06-19

## 2017-06-18 MED ORDER — SORBITOL 70 % SOLN
960.0000 mL | TOPICAL_OIL | Freq: Once | ORAL | Status: AC
  Administered 2017-06-18: 960 mL via RECTAL
  Filled 2017-06-18: qty 473

## 2017-06-18 MED ORDER — ACETAMINOPHEN 650 MG RE SUPP
325.0000 mg | Freq: Four times a day (QID) | RECTAL | Status: DC | PRN
Start: 2017-06-18 — End: 2017-06-19
  Administered 2017-06-18: 325 mg via RECTAL
  Filled 2017-06-18: qty 1

## 2017-06-18 MED ORDER — AMITRIPTYLINE HCL 50 MG PO TABS
50.0000 mg | ORAL_TABLET | Freq: Every day | ORAL | Status: DC
  Administered 2017-06-18 (×2): 50 mg via ORAL
  Filled 2017-06-18 (×2): qty 1

## 2017-06-18 MED ORDER — ACETAMINOPHEN 325 MG PO TABS: 325.0000 mg | ORAL_TABLET | Freq: Four times a day (QID) | ORAL | Status: DC | PRN

## 2017-06-18 MED ORDER — METOCLOPRAMIDE HCL 5 MG/ML IJ SOLN
10.0000 mg | Freq: Three times a day (TID) | INTRAMUSCULAR | Status: DC | PRN
  Administered 2017-06-18: 10 mg via INTRAVENOUS
  Filled 2017-06-18: qty 2

## 2017-06-18 MED ORDER — ONDANSETRON HCL 4 MG/2ML IJ SOLN
4.0000 mg | Freq: Four times a day (QID) | INTRAMUSCULAR | Status: DC | PRN
  Administered 2017-06-18 – 2017-06-19 (×5): 4 mg via INTRAVENOUS
  Filled 2017-06-18 (×5): qty 2

## 2017-06-18 MED ORDER — ALUM & MAG HYDROXIDE-SIMETH 200-200-20 MG/5ML PO SUSP
30.0000 mL | Freq: Once | ORAL | Status: AC
  Administered 2017-06-18: 30 mL via ORAL
  Filled 2017-06-18: qty 30

## 2017-06-18 MED ORDER — POLYETHYLENE GLYCOL 3350 17 G PO PACK
17.0000 g | PACK | Freq: Every day | ORAL | Status: DC
  Administered 2017-06-18 – 2017-06-19 (×2): 17 g via ORAL
  Filled 2017-06-18 (×2): qty 1

## 2017-06-18 MED ORDER — FLUTICASONE FUROATE-VILANTEROL 100-25 MCG/INH IN AEPB
1.0000 | INHALATION_SPRAY | Freq: Every day | RESPIRATORY_TRACT | Status: DC
  Filled 2017-06-18: qty 28

## 2017-06-18 MED ORDER — METRONIDAZOLE 0.75 % VA GEL
1.0000 | Freq: Every day | VAGINAL | Status: DC
  Administered 2017-06-18: 1 via VAGINAL
  Filled 2017-06-18: qty 70

## 2017-06-18 MED ORDER — SODIUM CHLORIDE 0.9 % IV SOLN
INTRAVENOUS | Status: DC
  Administered 2017-06-18 (×3): via INTRAVENOUS
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
## 2017-06-18 NOTE — Progress Notes (Signed)
Received patient from ED via stretcher.  Patient drowsy but easily arousable, AOx4, VS stable with slightly elevated BP at 154/89, O2Sat at 99% on RA, and still nauseous. Administered PRN Zofran 4mg  Inj per order and scheduled tegetrol and elavil tablets, now resting on bed with both eyes closed, call light within reach.  Will monitor.

## 2017-06-18 NOTE — Progress Notes (Signed)
Family Medicine progress update  Called to bedside. Patient frustrated that no clear diagnosis made and concerned we are just putting a bandaid on her nausea. Explained that we have a varied differential potential causes of her symptoms that consist of bacterial vaginosis, constipation, stress induced and possibly uterine fibroids. Has only had 1 bowel movement in 12 days. Will SMOG enema and see if helps with symptoms. Receiving metronidazole gel for BV. Explained to patient that I dont think it is a good idea to go home if she is not eating or drinking as she will just get dehydrated. Patient agreed with plan for the night. Will continue to follow and re-eval dc plan in morning.  Guadalupe Dawn MD PGY-1 Family Medicine Resident

## 2017-06-19 ENCOUNTER — Other Ambulatory Visit: Payer: Self-pay

## 2017-06-19 DIAGNOSIS — D219 Benign neoplasm of connective and other soft tissue, unspecified: Secondary | ICD-10-CM

## 2017-06-19 HISTORY — DX: Benign neoplasm of connective and other soft tissue, unspecified: D21.9

## 2017-06-19 LAB — COMPREHENSIVE METABOLIC PANEL
ALBUMIN: 3.8 g/dL (ref 3.5–5.0)
ALK PHOS: 74 U/L (ref 38–126)
ALT: 44 U/L (ref 14–54)
AST: 57 U/L — AB (ref 15–41)
Anion gap: 9 (ref 5–15)
BUN: 6 mg/dL (ref 6–20)
CHLORIDE: 105 mmol/L (ref 101–111)
CO2: 27 mmol/L (ref 22–32)
CREATININE: 0.85 mg/dL (ref 0.44–1.00)
Calcium: 8.5 mg/dL — ABNORMAL LOW (ref 8.9–10.3)
GFR calc non Af Amer: >60 mL/min (ref >60)
GLUCOSE: 148 mg/dL — AB (ref 65–99)
Potassium: 3.3 mmol/L — ABNORMAL LOW (ref 3.5–5.1)
SODIUM: 141 mmol/L (ref 135–145)
Total Bilirubin: 0.9 mg/dL (ref 0.3–1.2)
Total Protein: 7.3 g/dL (ref 6.5–8.1)

## 2017-06-19 LAB — CBC
HCT: 40 % (ref 36.0–46.0)
Hemoglobin: 13.1 g/dL (ref 12.0–15.0)
MCH: 27.3 pg (ref 26.0–34.0)
MCHC: 32.8 g/dL (ref 30.0–36.0)
MCV: 83.3 fL (ref 78.0–100.0)
PLATELETS: 331 10*3/uL (ref 150–400)
RBC: 4.8 MIL/uL (ref 3.87–5.11)
RDW: 13.8 % (ref 11.5–15.5)
WBC: 12 10*3/uL — ABNORMAL HIGH (ref 4.0–10.5)

## 2017-06-19 MED ORDER — METRONIDAZOLE 0.75 % VA GEL: 1.0000 | Freq: Every day | VAGINAL | 0 refills | Status: DC

## 2017-06-19 NOTE — Progress Notes (Signed)
Discharge paperwork reviewed with patient. No questions verbalized. Patient is ready for discharge.  

## 2017-06-19 NOTE — Progress Notes (Addendum)
Patient showed this RN red streaks from toilet hat coming from her rectum mixed with urine and stated that this was the 3rd time this happened (only 1 instance witnessed by this RN) when patient have a liquid BM after the enema administration.

## 2017-06-19 NOTE — Progress Notes (Signed)
Family Medicine Teaching Service Daily Progress Note Intern Pager: [phone]  Patient name: CHARLESTON VIERLING Medical record number: [id number] Date of birth: 1975-10-12 Age: 42 y.o. Gender: female  Primary Care Provider: Dickie La, MD Consultants: none Code Status: full  Pt Overview and Major Events to Date:  6/10 admitted, given enema  Assessment and Plan:  GIDGET QUIZHPI is a 42 y.o. female presenting with intractable nausea and vomiting for 2 days with associated abdominal pain. PMH is significant for bipolar affective disorder, HTN, asthma, chronic musculoskeletal pain, OA, DDD, and morbid obesity.   Intractable N/V/abdominal pain  Likely stress related since patient is under a considerable amount of stress and endorses that her abdominal pain is often due to stress.  Has gone through a thorough workup previously, and current studies are not convincing for pancreatitis (normal CT, barely elevated lipase).  Constipation is likely also a component, which could also be induced by stress. Objective is to help her rehydrate and manage pain and stress when she goes home.  Enema given on 6/10 for constipation. - clear liquid diet - CMP in am - Zofran 4mg  IVq6 prn  - IVFs NS @ maintenance rate 150cc/hr - consider discontinuing - bowel regimen at discharge  Bacterial Vaginosis Positive for clue cells on ED wet prep.  Possible may be contributing to patient's symptoms of lower abdominal pain. GC/Chlamydia negative. - metronidazole gel 0.75% nightly x 5 nights  HTN Listed on problem list, but patient has been normotensive at her last few office visits.  However, has been hypertensive while in hospital, last BP 156/96 on 6/11. Likely elevated due to abdominal pain - Trend BP  Bipolar Affective Disorder Chronic. On home Abilify, Elavil, and Tegretol. - continue Aripiprazole 10mg  - cont Amitriptyline 50mg  at night - cont carbamazepine 200mg  BID  Asthma Chronic.  On home albuterol and Symbicort. - albuterol inhaler as needed - Breo as replacement for symbicort  Morbid Obesity Chronic, BMI 46.6.  - Provide counseling on benefits of diet and exercise in relief of chronic medical problems.  Chronic pain Pt with multitude of chronic pain conditions on problem list.   Naproxen listed as home med but no report of narcotic use.   -Avoid opiates as would worsen constipation   FEN/GI: clear liquid diet PPx: lovenox  Disposition: home, hopefully 6/11  Subjective:  Patient says she is feeling better today and feels ready to go home.  She tolerated some juice and did not have nausea afterward.  Her abdominal pain improved after the enema was given.  Objective: Temp:  [97.5 F (36.4 C)-98.9 F (37.2 C)] 98.6 F (37 C) (06/11 0535) Pulse Rate:  [82-100] 82 (06/11 0535) Resp:  [16-19] 16 (06/11 0535) BP: (150-156)/(88-96) 156/96 (06/11 0535) SpO2:  [98 %-100 %] 100 % (06/11 0535) Physical Exam: General: standing in room, appears comfortable Cardiovascular: RRR, no MRG Respiratory: CTAB, no wheezes or rhonchi Abdomen: nontender to palpation, normal bowel sounds Extremities: no edema, normal ROM  Laboratory: Recent Labs  Lab 06/16/17 2238 06/17/17 1811 06/18/17 0810  WBC 9.7 9.0 10.5  HGB 13.3 13.2 12.5  HCT 41.2 40.7 37.8  PLT 303 292 307   Recent Labs  Lab 06/16/17 2238 06/17/17 1811 06/18/17 0810  NA 136 138 138  K 3.8 3.7 3.8  CL 102 103 104  CO2 24 24 25   BUN 10 9 5*  CREATININE 1.10* 1.08* 0.81  CALCIUM 9.5 9.0 8.8*  PROT 8.0 7.7 7.8  BILITOT 0.6 0.7 0.6  ALKPHOS 80 80 74  ALT 27 27 26   AST 31 29 25   GLUCOSE 116* 139* 161*    Imaging/Diagnostic Tests: Ct Abdomen Pelvis W Contrast  Result Date: 06/17/2017 CLINICAL DATA:  Initial evaluation for acute abdominal pain, generalized, nausea, vomiting. EXAM: CT ABDOMEN AND PELVIS WITH CONTRAST TECHNIQUE: Multidetector CT imaging of the abdomen and pelvis was performed using  the standard protocol following bolus administration of intravenous contrast. CONTRAST:  121mL OMNIPAQUE IOHEXOL 300 MG/ML  SOLN COMPARISON:  Prior CT from 06/08/2016 as well as radiograph from 06/15/2017. FINDINGS: Lower chest: 7 mm pleural base nodule density at the right middle lobe appears more linear on coronal sagittal reconstructions, likely focal atelectasis. Visualized lung bases are otherwise clear. Hepatobiliary: Liver demonstrates a normal contrast enhanced appearance. Gallbladder within normal limits. No biliary dilatation. Pancreas: Pancreas within normal limits. Spleen: Unremarkable. Adrenals/Urinary Tract: Adrenal glands are normal. Kidneys equal in size with symmetric enhancement. No nephrolithiasis, hydronephrosis, or focal enhancing renal mass. No hydroureter. Partially distended bladder within normal limits. Stomach/Bowel: Small hiatal hernia noted. Stomach otherwise unremarkable. No evidence for bowel obstruction. No acute inflammatory changes seen about the bowels. Overall stool burden mild to moderate nature. Vascular/Lymphatic: Normal intravascular enhancement seen throughout the intra-abdominal aorta and its branch vessels. No adenopathy. Reproductive: Probable fibroid uterus noted. Ovaries within normal limits. Other: No free air or fluid. Tiny fat containing paraumbilical hernia noted. Musculoskeletal: No acute osseous abnormality. No worrisome lytic or blastic osseous lesions. Benign bone island noted within the T12 vertebral body. Mild sclerotic changes noted about the SI joints bilaterally, right greater than left. IMPRESSION: 1. No CT evidence for acute intra-abdominal or pelvic process. 2. Probable fibroid uterus. Electronically Signed   By: Jeannine Boga M.D.   On: 06/17/2017 04:24   Dg Abd Acute W/chest  Result Date: 06/15/2017 CLINICAL DATA:  Constipation and vomiting EXAM: DG ABDOMEN ACUTE W/ 1V CHEST COMPARISON:  Abdominal radiograph 04/04/2017 FINDINGS: There are no  dilated bowel loops or free intraperitoneal air. No radiopaque calculi or other significant radiographic abnormality is seen. There is a large amount of stool in the colon. Heart size and mediastinal contours are within normal limits. Both lungs are clear. IMPRESSION: Large amount of colonic stool. Electronically Signed   By: Ulyses Jarred M.D.   On: 06/15/2017 03:31     Winfrey, Alcario Drought, MD 06/19/2017, 6:49 AM PGY-1, [place] Intern pager: [phone], text pages welcome

## 2017-06-19 NOTE — Discharge Instructions (Signed)
We think that your stomach pain is due to constipation and stress (which can itself cause constipation).  Please take Miralax every day to keep your constipation from becoming a problem.  Please also take metronidazole gel to treat your bacterial vaginosis for four more nights.    You have an appointment to see Dr. Cyndia Skeeters on Thursday, June 13.  He is a Social worker of Dr. Nori Riis and will continue to assess your abdominal pain.

## 2017-06-19 NOTE — Discharge Summary (Addendum)
[facility] Discharge Summary  Patient name: Holly Mendez Medical record number: [id number] Date of birth: 1975-03-19 Age: 42 y.o. Gender: female Date of Admission: 06/17/2017  Date of Discharge: 06/19/17 Admitting Physician: Holly Resides, MD  Primary Care Provider: Dickie La, MD Consultants: none  Indication for Hospitalization: abdominal pain, nausea, vomiting  Discharge Diagnoses/Problem List:  Intractable nausea, vomiting, and abdominal pain Bacterial vaginosis HTN Bipolar Disorder Asthma Morbid Obesity Chronic Pain  Disposition: home  Discharge Condition: stable, improved  Discharge Exam:  General: standing in room, appears comfortable Cardiovascular: RRR, no MRG Respiratory: CTAB, no wheezes or rhonchi Abdomen: nontender to palpation, normal bowel sounds Extremities: no edema, normal ROM  Brief Hospital Course:  Holly Mendez was admitted on 06/18/17 for abdominal pain, nausea, vomiting, and poor PO intake.  Lipase was slightly elevated at 55, but CT was negative for signs of pancreatitis, although it did show a moderate to large stool burden.  She was asked about marijuana use given its association with cyclic nausea and vomiting, but she denied using marijuana.  Since she has had a very thorough workup for abdominal pain in the past that was negative, she was treated symptomatically with zofran, reglan, and IV fluids.  She endorsed being under considerable stress at home, so stress-induced symptoms such as IBS were considered the most likely etiology of patient's pain and N/V.  Later on 6/10, patient was given an enema and said that her subsequent bowel movement helped relieve her symptoms.  On 6/11, patient was tolerating PO intake and was ready to go home, so she was discharged.  Patient was also found to have bacterial vaginosis on wet prep at admission.  GC/Chlamydia were negative.  She was started on metronidazole gel x  5 days since she could not tolerate PO on admission.   Issues for Follow Up:  1. Please ensure that patient is continuing her metronidazole (end date 6/14). 2. Patient would benefit from speaking with integrated care regarding her stressful home situation. 3. Please avoid narcotics in this patient.  She does not have known history of narcotic use, but the risks of this medication outweigh the benefits for abdominal pain.  Significant Procedures: none  Significant Labs and Imaging:  Recent Labs  Lab 06/17/17 1811 06/18/17 0810 06/19/17 0812  WBC 9.0 10.5 12.0*  HGB 13.2 12.5 13.1  HCT 40.7 37.8 40.0  PLT 292 307 331   Recent Labs  Lab 06/16/17 2238 06/17/17 1811 06/18/17 0810 06/19/17 0812  NA 136 138 138 141  K 3.8 3.7 3.8 3.3*  CL 102 103 104 105  CO2 24 24 25 27   GLUCOSE 116* 139* 161* 148*  BUN 10 9 5* 6  CREATININE 1.10* 1.08* 0.81 0.85  CALCIUM 9.5 9.0 8.8* 8.5*  ALKPHOS 80 80 74 74  AST 31 29 25  57*  ALT 27 27 26  44  ALBUMIN 4.1 4.1 3.9 3.8   Ct Abdomen Pelvis W Contrast  Result Date: 06/17/2017 CLINICAL DATA:  Initial evaluation for acute abdominal pain, generalized, nausea, vomiting. EXAM: CT ABDOMEN AND PELVIS WITH CONTRAST TECHNIQUE: Multidetector CT imaging of the abdomen and pelvis was performed using the standard protocol following bolus administration of intravenous contrast. CONTRAST:  131mL OMNIPAQUE IOHEXOL 300 MG/ML  SOLN COMPARISON:  Prior CT from 06/08/2016 as well as radiograph from 06/15/2017. FINDINGS: Lower chest: 7 mm pleural base nodule density at the right middle lobe appears more linear on coronal sagittal reconstructions, likely focal atelectasis. Visualized lung  bases are otherwise clear. Hepatobiliary: Liver demonstrates a normal contrast enhanced appearance. Gallbladder within normal limits. No biliary dilatation. Pancreas: Pancreas within normal limits. Spleen: Unremarkable. Adrenals/Urinary Tract: Adrenal glands are normal. Kidneys equal in  size with symmetric enhancement. No nephrolithiasis, hydronephrosis, or focal enhancing renal mass. No hydroureter. Partially distended bladder within normal limits. Stomach/Bowel: Small hiatal hernia noted. Stomach otherwise unremarkable. No evidence for bowel obstruction. No acute inflammatory changes seen about the bowels. Overall stool burden mild to moderate nature. Vascular/Lymphatic: Normal intravascular enhancement seen throughout the intra-abdominal aorta and its branch vessels. No adenopathy. Reproductive: Probable fibroid uterus noted. Ovaries within normal limits. Other: No free air or fluid. Tiny fat containing paraumbilical hernia noted. Musculoskeletal: No acute osseous abnormality. No worrisome lytic or blastic osseous lesions. Benign bone island noted within the T12 vertebral body. Mild sclerotic changes noted about the SI joints bilaterally, right greater than left. IMPRESSION: 1. No CT evidence for acute intra-abdominal or pelvic process. 2. Probable fibroid uterus. Electronically Signed   By: Jeannine Boga M.D.   On: 06/17/2017 04:24   Dg Abd Acute W/chest  Result Date: 06/15/2017 CLINICAL DATA:  Constipation and vomiting EXAM: DG ABDOMEN ACUTE W/ 1V CHEST COMPARISON:  Abdominal radiograph 04/04/2017 FINDINGS: There are no dilated bowel loops or free intraperitoneal air. No radiopaque calculi or other significant radiographic abnormality is seen. There is a large amount of stool in the colon. Heart size and mediastinal contours are within normal limits. Both lungs are clear. IMPRESSION: Large amount of colonic stool. Electronically Signed   By: Ulyses Jarred M.D.   On: 06/15/2017 03:31     Results/Tests Pending at Time of Discharge: none  Discharge Medications:  Allergies as of 06/19/2017      Reactions   Norvasc [amlodipine Besylate] Other (See Comments)   Reaction:  Mouth irritation    Zithromax [azithromycin] Other (See Comments)   Reaction:  Severe stomach cramping       Medication List    TAKE these medications   albuterol 108 (90 Base) MCG/ACT inhaler Commonly known as:  PROAIR HFA Inhale 2 puffs into the lungs every 6 (six) hours as needed for wheezing or shortness of breath. (200/8=25)   amitriptyline 25 MG tablet Commonly known as:  ELAVIL Take 2 tablets by mouth at bedtime   ARIPiprazole 10 MG tablet Commonly known as:  ABILIFY Take 10 mg by mouth daily.   budesonide-formoterol 80-4.5 MCG/ACT inhaler Commonly known as:  SYMBICORT inhale two puffs into THE lungs TWICE DAILY. (120/4=30)   carbamazepine 200 MG tablet Commonly known as:  TEGRETOL Take 200 mg by mouth 2 (two) times daily.   cetirizine 10 MG tablet Commonly known as:  ZYRTEC Take 1 tablet (10 mg total) by mouth daily.   esomeprazole 40 MG capsule Commonly known as:  NEXIUM Take 40 mg by mouth daily before breakfast.   fluticasone 50 MCG/ACT nasal spray Commonly known as:  FLONASE Place 2 sprays into both nostrils daily as needed for allergies or rhinitis.   Magnesium 500 MG Caps Take 1 capsule (500 mg total) by mouth 2 (two) times daily at 8 am and 10 pm.   methocarbamol 750 MG tablet Commonly known as:  ROBAXIN Take 1 tablet (750 mg total) by mouth every 8 (eight) hours as needed for muscle spasms.   metroNIDAZOLE 0.75 % vaginal gel Commonly known as:  METROGEL Place 1 Applicatorful vaginally at bedtime.   naproxen 500 MG tablet Commonly known as:  NAPROSYN Take 1 tablet (500  mg total) by mouth 2 (two) times daily.   ondansetron 4 MG disintegrating tablet Commonly known as:  ZOFRAN-ODT ONE tablet every FOUR hours AS NEEDED FOR nausea vomting What changed:  Another medication with the same name was removed. Continue taking this medication, and follow the directions you see here.   polyethylene glycol packet Commonly known as:  MIRALAX / GLYCOLAX Take 1 packet with 8 ounces of water daily as needed for constipation.       Discharge Instructions: Please  refer to Patient Instructions section of EMR for full details.  Patient was counseled important signs and symptoms that should prompt return to medical care, changes in medications, dietary instructions, activity restrictions, and follow up appointments.   Follow-Up Appointments: Follow-up Information    Mercy Riding, MD. Go on 06/21/2017.   Specialty:  Family Medicine Why:  Please go to appointment with Dr. Cyndia Skeeters at 8:50AM. Contact information: [place] [address] [phone]           Kathrene Alu, MD 06/19/2017, 2:36 PM PGY-1, [place]

## 2017-06-19 NOTE — Social Work (Signed)
CSW verbally consulted by bedside RN to support pt with transportation. Per pt her family and friends go to work around [employer]  and she is unable to get home otherwise. Pt unable to use [employer], CSW will provide cab voucher. Pt confirmed that she is able to get into home, address confirmed as: [address], [place], [place], [address].   CSW signing off. Please consult if any additional needs arise.  [place], [place] Work [phone]

## 2017-06-21 ENCOUNTER — Ambulatory Visit: Payer: Medicaid Other | Admitting: Pain Medicine

## 2017-06-21 ENCOUNTER — Inpatient Hospital Stay: Payer: Medicaid Other | Admitting: Student

## 2017-06-21 DIAGNOSIS — M545 Low back pain: Secondary | ICD-10-CM

## 2017-06-21 DIAGNOSIS — G8929 Other chronic pain: Secondary | ICD-10-CM | POA: Insufficient documentation

## 2017-06-21 NOTE — Progress Notes (Unsigned)
Patient's Name: Holly Mendez  MRN: [id number]  Referring Provider: Dickie La, MD  DOB: 19-Nov-1975  PCP: Dickie La, MD  DOS: 06/21/2017  Note by: Gaspar Cola, MD  Service setting: Ambulatory outpatient  Specialty: Interventional Pain Management  Patient type: Established  Location: [facility] (AMB) Pain Management Facility  Visit type: Interventional Procedure   Primary Reason for Visit: Interventional Pain Management Treatment. CC: No chief complaint on file.  Procedure:       Anesthesia, Analgesia, Anxiolysis:  Type: Lumbar Facet, Medial Branch Block(s) #1  Primary Purpose: Diagnostic Region: Posterolateral Lumbosacral Spine Level: L2, L3, L4, L5, & S1 Medial Branch Level(s). Injecting these levels blocks the L3-4, L4-5, and L5-S1 lumbar facet joints. Laterality: Bilateral  Type: Moderate (Conscious) Sedation combined with Local Anesthesia Indication(s): Analgesia and Anxiety Route: Intravenous (IV) IV Access: Secured Sedation: Meaningful verbal contact was maintained at all times during the procedure  Local Anesthetic: Lidocaine 1-2%   Indications: 1. Spondylosis without myelopathy or radiculopathy, lumbosacral region   2. Lumbar facet syndrome (Bilateral)   3. Lumbar facet hypertrophy (Bilateral)   4. Chronic low back pain (Bilateral) (R>L) w/o sciatica    Pain Score: Pre-procedure:  /10 Post-procedure:  /10  Pre-op Assessment:  Holly Mendez is a 42 y.o. (year old), female patient, seen today for interventional treatment. She  has a past surgical history that includes Esophagogastroduodenoscopy (02/14/2011); Dilation and curettage of uterus (2003); Tubal ligation (1998); Eye surgery (Left, 1980 X 2); Endometrial ablation (~ 2008); Esophagogastroduodenoscopy (2013); and EUS (N/A, 10/08/2015). Ms. Stratmann has a current medication list which includes the following prescription(s): albuterol, amitriptyline, aripiprazole, budesonide-formoterol, carbamazepine,  cetirizine, esomeprazole, fluticasone, magnesium, methocarbamol, metronidazole, naproxen, ondansetron, and polyethylene glycol. Her primarily concern today is the No chief complaint on file.  Initial Vital Signs:  Pulse/HCG Rate:    Temp:   Resp:   BP:   SpO2:    BMI: Estimated body mass index is 46.59 kg/m as calculated from the following:   Height as of 06/16/17: 5\' 5"  (1.651 m).   Weight as of 06/16/17: 280 lb (127 kg).  Risk Assessment: Allergies: Reviewed. She is allergic to norvasc [amlodipine besylate] and zithromax [azithromycin].  Allergy Precautions: None required Coagulopathies: Reviewed. None identified.  Blood-thinner therapy: None at this time Active Infection(s): Reviewed. None identified. Holly Mendez is afebrile  Site Confirmation: Holly Mendez was asked to confirm the procedure and laterality before marking the site Procedure checklist: Completed Consent: Before the procedure and under the influence of no sedative(s), amnesic(s), or anxiolytics, the patient was informed of the treatment options, risks and possible complications. To fulfill our ethical and legal obligations, as recommended by the American Medical Association's Code of Ethics, I have informed the patient of my clinical impression; the nature and purpose of the treatment or procedure; the risks, benefits, and possible complications of the intervention; the alternatives, including doing nothing; the risk(s) and benefit(s) of the alternative treatment(s) or procedure(s); and the risk(s) and benefit(s) of doing nothing. The patient was provided information about the general risks and possible complications associated with the procedure. These may include, but are not limited to: failure to achieve desired goals, infection, bleeding, organ or nerve damage, allergic reactions, paralysis, and death. In addition, the patient was informed of those risks and complications associated to Spine-related procedures,  such as failure to decrease pain; infection (i.e.: Meningitis, epidural or intraspinal abscess); bleeding (i.e.: epidural hematoma, subarachnoid hemorrhage, or any other type of intraspinal or peri-dural bleeding); organ or  nerve damage (i.e.: Any type of peripheral nerve, nerve root, or spinal cord injury) with subsequent damage to sensory, motor, and/or autonomic systems, resulting in permanent pain, numbness, and/or weakness of one or several areas of the body; allergic reactions; (i.e.: anaphylactic reaction); and/or death. Furthermore, the patient was informed of those risks and complications associated with the medications. These include, but are not limited to: allergic reactions (i.e.: anaphylactic or anaphylactoid reaction(s)); adrenal axis suppression; blood sugar elevation that in diabetics may result in ketoacidosis or comma; water retention that in patients with history of congestive heart failure may result in shortness of breath, pulmonary edema, and decompensation with resultant heart failure; weight gain; swelling or edema; medication-induced neural toxicity; particulate matter embolism and blood vessel occlusion with resultant organ, and/or nervous system infarction; and/or aseptic necrosis of one or more joints. Finally, the patient was informed that Medicine is not an exact science; therefore, there is also the possibility of unforeseen or unpredictable risks and/or possible complications that may result in a catastrophic outcome. The patient indicated having understood very clearly. We have given the patient no guarantees and we have made no promises. Enough time was given to the patient to ask questions, all of which were answered to the patient's satisfaction. Holly Mendez has indicated that she wanted to continue with the procedure. Attestation: I, the ordering provider, attest that I have discussed with the patient the benefits, risks, side-effects, alternatives, likelihood of  achieving goals, and potential problems during recovery for the procedure that I have provided informed consent. Date  Time: {CHL [facility]-PAIN TIME CHOICES:21018001}  Pre-Procedure Preparation:  Monitoring: As per clinic protocol. Respiration, ETCO2, SpO2, BP, heart rate and rhythm monitor placed and checked for adequate function Safety Precautions: Patient was assessed for positional comfort and pressure points before starting the procedure. Time-out: I initiated and conducted the "Time-out" before starting the procedure, as per protocol. The patient was asked to participate by confirming the accuracy of the "Time Out" information. Verification of the correct person, site, and procedure were performed and confirmed by me, the nursing staff, and the patient. "Time-out" conducted as per Joint Commission's Universal Protocol (UP.01.01.01). Time:    Description of Procedure:       Position: Prone Laterality: Bilateral. The procedure was performed in identical fashion on both sides. Levels:  L2, L3, L4, L5, & S1 Medial Branch Level(s) Area Prepped: Posterior Lumbosacral Region Prepping solution: ChloraPrep (2% chlorhexidine gluconate and 70% isopropyl alcohol) Safety Precautions: Aspiration looking for blood return was conducted prior to all injections. At no point did we inject any substances, as a needle was being advanced. Before injecting, the patient was told to immediately notify me if she was experiencing any new onset of "ringing in the ears, or metallic taste in the mouth". No attempts were made at seeking any paresthesias. Safe injection practices and needle disposal techniques used. Medications properly checked for expiration dates. SDV (single dose vial) medications used. After the completion of the procedure, all disposable equipment used was discarded in the proper designated medical waste containers. Local Anesthesia: Protocol guidelines were followed. The patient was positioned over the  fluoroscopy table. The area was prepped in the usual manner. The time-out was completed. The target area was identified using fluoroscopy. A 12-in long, straight, sterile hemostat was used with fluoroscopic guidance to locate the targets for each level blocked. Once located, the skin was marked with an approved surgical skin marker. Once all sites were marked, the skin (epidermis, dermis, and hypodermis), as  well as deeper tissues (fat, connective tissue and muscle) were infiltrated with a small amount of a short-acting local anesthetic, loaded on a 10cc syringe with a 25G, 1.5-in  Needle. An appropriate amount of time was allowed for local anesthetics to take effect before proceeding to the next step. Local Anesthetic: Lidocaine 2.0% The unused portion of the local anesthetic was discarded in the proper designated containers. Technical explanation of process:  L2 Medial Branch Nerve Block (MBB): The target area for the L2 medial branch is at the junction of the postero-lateral aspect of the superior articular process and the superior, posterior, and medial edge of the transverse process of L3. Under fluoroscopic guidance, a Quincke needle was inserted until contact was made with os over the superior postero-lateral aspect of the pedicular shadow (target area). After negative aspiration for blood, 0.5 mL of the nerve block solution was injected without difficulty or complication. The needle was removed intact. L3 Medial Branch Nerve Block (MBB): The target area for the L3 medial branch is at the junction of the postero-lateral aspect of the superior articular process and the superior, posterior, and medial edge of the transverse process of L4. Under fluoroscopic guidance, a Quincke needle was inserted until contact was made with os over the superior postero-lateral aspect of the pedicular shadow (target area). After negative aspiration for blood, 0.5 mL of the nerve block solution was injected without difficulty  or complication. The needle was removed intact. L4 Medial Branch Nerve Block (MBB): The target area for the L4 medial branch is at the junction of the postero-lateral aspect of the superior articular process and the superior, posterior, and medial edge of the transverse process of L5. Under fluoroscopic guidance, a Quincke needle was inserted until contact was made with os over the superior postero-lateral aspect of the pedicular shadow (target area). After negative aspiration for blood, 0.5 mL of the nerve block solution was injected without difficulty or complication. The needle was removed intact. L5 Medial Branch Nerve Block (MBB): The target area for the L5 medial branch is at the junction of the postero-lateral aspect of the superior articular process and the superior, posterior, and medial edge of the sacral ala. Under fluoroscopic guidance, a Quincke needle was inserted until contact was made with os over the superior postero-lateral aspect of the pedicular shadow (target area). After negative aspiration for blood, 0.5 mL of the nerve block solution was injected without difficulty or complication. The needle was removed intact. S1 Medial Branch Nerve Block (MBB): The target area for the S1 medial branch is at the posterior and inferior 6 o'clock position of the L5-S1 facet joint. Under fluoroscopic guidance, the Quincke needle inserted for the L5 MBB was redirected until contact was made with os over the inferior and postero aspect of the sacrum, at the 6 o' clock position under the L5-S1 facet joint (Target area). After negative aspiration for blood, 0.5 mL of the nerve block solution was injected without difficulty or complication. The needle was removed intact. Procedural Needles: 22-gauge, 3.5-inch, Quincke needles used for all levels. Nerve block solution: 0.2% PF-Ropivacaine + Triamcinolone (40 mg/mL) diluted to a final concentration of 4 mg of Triamcinolone/mL of Ropivacaine The unused portion of  the solution was discarded in the proper designated containers.  Once the entire procedure was completed, the treated area was cleaned, making sure to leave some of the prepping solution back to take advantage of its long term bactericidal properties.   Illustration of the posterior view of  the lumbar spine and the posterior neural structures. Laminae of L2 through S1 are labeled. DPRL5, dorsal primary ramus of L5; DPRS1, dorsal primary ramus of S1; DPR3, dorsal primary ramus of L3; FJ, facet (zygapophyseal) joint L3-L4; I, inferior articular process of L4; LB1, lateral branch of dorsal primary ramus of L1; IAB, inferior articular branches from L3 medial branch (supplies L4-L5 facet joint); IBP, intermediate branch plexus; MB3, medial branch of dorsal primary ramus of L3; NR3, third lumbar nerve root; S, superior articular process of L5; SAB, superior articular branches from L4 (supplies L4-5 facet joint also); TP3, transverse process of L3.  There were no vitals filed for this visit.  Start Time:   hrs. End Time:   hrs.  Imaging Guidance (Spinal):  Type of Imaging Technique: Fluoroscopy Guidance (Spinal) Indication(s): Assistance in needle guidance and placement for procedures requiring needle placement in or near specific anatomical locations not easily accessible without such assistance. Exposure Time: Please see nurses notes. Contrast: None used. Fluoroscopic Guidance: I was personally present during the use of fluoroscopy. "Tunnel Vision Technique" used to obtain the best possible view of the target area. Parallax error corrected before commencing the procedure. "Direction-depth-direction" technique used to introduce the needle under continuous pulsed fluoroscopy. Once target was reached, antero-posterior, oblique, and lateral fluoroscopic projection used confirm needle placement in all planes. Images permanently stored in EMR. Interpretation: No contrast injected. I personally interpreted the  imaging intraoperatively. Adequate needle placement confirmed in multiple planes. Permanent images saved into the patient's record.  Antibiotic Prophylaxis:   Anti-infectives (From admission, onward)   None     Indication(s): None identified  Post-operative Assessment:  Post-procedure Vital Signs:  Pulse/HCG Rate:    Temp:   Resp:   BP:   SpO2:    EBL: None  Complications: No immediate post-treatment complications observed by team, or reported by patient.  Note: The patient tolerated the entire procedure well. A repeat set of vitals were taken after the procedure and the patient was kept under observation following institutional policy, for this type of procedure. Post-procedural neurological assessment was performed, showing return to baseline, prior to discharge. The patient was provided with post-procedure discharge instructions, including a section on how to identify potential problems. Should any problems arise concerning this procedure, the patient was given instructions to immediately contact us, at any time, without hesitation. In any case, we plan to contact the patient by telephone for a follow-up status report regarding this interventional procedure.  Comments:  No additional relevant information.  Plan of Care   Imaging Orders  No imaging studies ordered today   Procedure Orders    No procedure(s) ordered today    Medications ordered for procedure: No orders of the defined types were placed in this encounter.  Medications administered: Becky C. Brown-Norland had no medications administered during this visit.  See the medical record for exact dosing, route, and time of administration.  New Prescriptions   No medications on file   Disposition: Discharge home  Discharge Date & Time: 06/21/2017;   hrs.   Physician-requested Follow-up: No follow-ups on file.  Future Appointments  Date Time Provider [place]  06/21/2017  9:00 AM Milinda Pointer,  MD [facility] None  06/21/2017  2:30 PM Mercy Riding, MD [facility]   Primary Care Physician: Dickie La, MD Location: [facility] Note by: Gaspar Cola, MD Date: 06/21/2017; Time: 6:37 AM  Disclaimer:  Medicine is not an exact science. The only guarantee  in medicine is that nothing is guaranteed. It is important to note that the decision to proceed with this intervention was based on the information collected from the patient. The Data and conclusions were drawn from the patient's questionnaire, the interview, and the physical examination. Because the information was provided in large part by the patient, it cannot be guaranteed that it has not been purposely or unconsciously manipulated. Every effort has been made to obtain as much relevant data as possible for this evaluation. It is important to note that the conclusions that lead to this procedure are derived in large part from the available data. Always take into account that the treatment will also be dependent on availability of resources and existing treatment guidelines, considered by other Pain Management Practitioners as being common knowledge and practice, at the time of the intervention. For Medico-Legal purposes, it is also important to point out that variation in procedural techniques and pharmacological choices are the acceptable norm. The indications, contraindications, technique, and results of the above procedure should only be interpreted and judged by a Board-Certified Interventional Pain Specialist with extensive familiarity and expertise in the same exact procedure and technique.

## 2017-06-22 ENCOUNTER — Encounter: Payer: Medicaid Other | Admitting: Obstetrics & Gynecology

## 2017-06-26 ENCOUNTER — Other Ambulatory Visit: Payer: Self-pay | Admitting: Family Medicine

## 2017-06-26 ENCOUNTER — Telehealth: Payer: Self-pay | Admitting: Family Medicine

## 2017-06-26 DIAGNOSIS — M5441 Lumbago with sciatica, right side: Secondary | ICD-10-CM

## 2017-06-26 DIAGNOSIS — R269 Unspecified abnormalities of gait and mobility: Secondary | ICD-10-CM

## 2017-06-26 DIAGNOSIS — M47816 Spondylosis without myelopathy or radiculopathy, lumbar region: Secondary | ICD-10-CM

## 2017-06-26 DIAGNOSIS — M79604 Pain in right leg: Principal | ICD-10-CM

## 2017-06-26 DIAGNOSIS — G8929 Other chronic pain: Secondary | ICD-10-CM

## 2017-06-26 DIAGNOSIS — M79651 Pain in right thigh: Secondary | ICD-10-CM

## 2017-06-26 NOTE — Progress Notes (Signed)
Ordered quad cane

## 2017-06-26 NOTE — Telephone Encounter (Signed)
Pt requesting a Rx be sent to [facility] for a quad cane with 4 prongs on the bottom and a wedge pillow. This needs to be faxed to [facility] at [phone]. Pt would like to be called once this has been done.   Pt said medicare would not cover the previous cane that was sent in for her, they will only cover the quad cane.

## 2017-06-27 ENCOUNTER — Other Ambulatory Visit: Payer: Self-pay | Admitting: Family Medicine

## 2017-06-27 DIAGNOSIS — G8929 Other chronic pain: Secondary | ICD-10-CM

## 2017-06-27 DIAGNOSIS — M79604 Pain in right leg: Principal | ICD-10-CM

## 2017-06-27 NOTE — Telephone Encounter (Signed)
Dear [name] Team Please fax the order I placed on your desk to [facility]! [name]

## 2017-06-28 NOTE — Telephone Encounter (Signed)
Dr. Nori Riis is out of the office.  Spoke with Dr. Andria Frames who advises to stop naproxen and follow low salt diet next 4 days.  She is to call back on Monday.  If she is still having swelling we will make appt. If swelling is going down then we will make naproxen an allergy in her chart.  Pt informed and agreeable with plan.   [place], Holly Mendez, CMA

## 2017-06-28 NOTE — Telephone Encounter (Signed)
Pt was given naproxen to take at hospital stay last week. As a result her hands, legs and feet are swollen so tight.. When she googled the medicine, the swelling was a side effect.  Should she continue to take the medicine?  Please advise

## 2017-06-29 ENCOUNTER — Ambulatory Visit: Payer: Medicaid Other

## 2017-07-03 ENCOUNTER — Telehealth: Payer: Self-pay

## 2017-07-03 NOTE — Telephone Encounter (Signed)
Received fax from [facility] for PA of Clindamycin 2% cream. Not on current med list. Spoke with pharmacist who stated it is a refill on a previous prescription.   Has been discontinued on med list. Please advise if you want to pursue a PA.  Danley Danker, RN [facility] RN)

## 2017-07-03 NOTE — Telephone Encounter (Signed)
RN TEAM I do not know what it was for or why she is requesting a refill so I do not think we need to request prior authorization at this time [facility]! [name]

## 2017-07-05 ENCOUNTER — Other Ambulatory Visit: Payer: Self-pay

## 2017-07-05 ENCOUNTER — Encounter: Payer: Self-pay | Admitting: Pain Medicine

## 2017-07-05 ENCOUNTER — Ambulatory Visit (HOSPITAL_BASED_OUTPATIENT_CLINIC_OR_DEPARTMENT_OTHER): Payer: Medicaid Other | Admitting: Pain Medicine

## 2017-07-05 ENCOUNTER — Ambulatory Visit
Admission: RE | Admit: 2017-07-05 | Discharge: 2017-07-05 | Disposition: A | Discharge status: Home / Self Care | Payer: Medicaid Other | Source: Ambulatory Visit | Attending: Pain Medicine | Admitting: Pain Medicine

## 2017-07-05 ENCOUNTER — Other Ambulatory Visit: Payer: Self-pay | Admitting: Pain Medicine

## 2017-07-05 ENCOUNTER — Telehealth: Payer: Self-pay | Admitting: Family Medicine

## 2017-07-05 VITALS — BP 104/92 | HR 82 | Temp 98.0°F | Resp 16 | Ht 65.0 in | Wt 282.0 lb

## 2017-07-05 DIAGNOSIS — M7918 Myalgia, other site: Secondary | ICD-10-CM

## 2017-07-05 DIAGNOSIS — G8929 Other chronic pain: Secondary | ICD-10-CM

## 2017-07-05 DIAGNOSIS — M5136 Other intervertebral disc degeneration, lumbar region: Secondary | ICD-10-CM | POA: Diagnosis not present

## 2017-07-05 DIAGNOSIS — M545 Low back pain: Secondary | ICD-10-CM | POA: Diagnosis present

## 2017-07-05 DIAGNOSIS — M47816 Spondylosis without myelopathy or radiculopathy, lumbar region: Secondary | ICD-10-CM

## 2017-07-05 DIAGNOSIS — M47817 Spondylosis without myelopathy or radiculopathy, lumbosacral region: Secondary | ICD-10-CM

## 2017-07-05 DIAGNOSIS — M51369 Other intervertebral disc degeneration, lumbar region without mention of lumbar back pain or lower extremity pain: Secondary | ICD-10-CM

## 2017-07-05 MED ORDER — TRIAMCINOLONE ACETONIDE 40 MG/ML IJ SUSP
INTRAMUSCULAR | Status: AC
  Filled 2017-07-05: qty 1

## 2017-07-05 MED ORDER — HYDROXYZINE HCL 10 MG PO TABS: 10.0000 mg | ORAL_TABLET | Freq: Three times a day (TID) | ORAL | 0 refills | Status: DC | PRN

## 2017-07-05 MED ORDER — TRIAMCINOLONE ACETONIDE 40 MG/ML IJ SUSP
80.0000 mg | Freq: Once | INTRAMUSCULAR | Status: AC
  Administered 2017-07-05: 40 mg
  Filled 2017-07-05: qty 2

## 2017-07-05 MED ORDER — LIDOCAINE HCL 2 % IJ SOLN
20.0000 mL | Freq: Once | INTRAMUSCULAR | Status: AC
  Administered 2017-07-05: 400 mg
  Filled 2017-07-05: qty 40

## 2017-07-05 MED ORDER — ROPIVACAINE HCL 2 MG/ML IJ SOLN
18.0000 mL | Freq: Once | INTRAMUSCULAR | Status: AC
  Administered 2017-07-05: 10 mL via PERINEURAL
  Filled 2017-07-05: qty 20

## 2017-07-05 MED ORDER — MIDAZOLAM HCL 5 MG/5ML IJ SOLN: 1.0000 mg | INTRAMUSCULAR | Status: DC | PRN

## 2017-07-05 MED ORDER — LACTATED RINGERS IV SOLN: 1000.0000 mL | Freq: Once | INTRAVENOUS | Status: DC

## 2017-07-05 MED ORDER — FENTANYL CITRATE (PF) 100 MCG/2ML IJ SOLN: 25.0000 ug | INTRAMUSCULAR | Status: DC | PRN

## 2017-07-05 NOTE — Telephone Encounter (Signed)
Pt is having an allergic reaction to something.  She is broken out on her chest and arms. She thinks it could be some new lotion. She doesn't have the money to buy anything.  She has an acct at her drugstore-[facility].  She took 2 benedryls 2 days ago and it didn't make a difference. Could dr neal call in something?

## 2017-07-05 NOTE — Telephone Encounter (Signed)
Dear [name] Team [facility] please let her know I called in some hydroxyzine tablets. THANKS! [name]

## 2017-07-05 NOTE — Progress Notes (Signed)
Patient's Name: Holly Mendez  MRN: [id number]  Referring Provider: Dickie La, MD  DOB: 1975/02/03  PCP: Dickie La, MD  DOS: 07/05/2017  Note by: Gaspar Cola, MD  Service setting: Ambulatory outpatient  Specialty: Interventional Pain Management  Patient type: Established  Location: [facility] (AMB) Pain Management Facility  Visit type: Interventional Procedure   Primary Reason for Visit: Interventional Pain Management Treatment. CC: Back Pain (lower)  Procedure:          Anesthesia, Analgesia, Anxiolysis:  Type: Lumbar Facet, Medial Branch Block(s) #1  Primary Purpose: Diagnostic Region: Posterolateral Lumbosacral Spine Level: L2, L3, L4, L5, & S1 Medial Branch Level(s). Injecting these levels blocks the L3-4, L4-5, and L5-S1 lumbar facet joints. Laterality: Bilateral  Type: Local Anesthesia Indication(s): Analgesia         Route: Infiltration ([place]/IM) IV Access: Declined Sedation: Declined  Local Anesthetic: Lidocaine 1-2%   Indications: 1. Spondylosis without myelopathy or radiculopathy, lumbosacral region   2. Lumbar facet syndrome (Bilateral)   3. Lumbar facet hypertrophy (Bilateral)   4. DDD (degenerative disc disease), lumbar   5. Chronic low back pain (Bilateral) (R>L) w/o sciatica    Pain Score: Pre-procedure: 5 /10 Post-procedure: 0-No pain/10  Pre-op Assessment:  Holly Mendez is a 42 y.o. (year old), female patient, seen today for interventional treatment. She  has a past surgical history that includes Esophagogastroduodenoscopy (02/14/2011); Dilation and curettage of uterus (2003); Tubal ligation (1998); Eye surgery (Left, 1980 X 2); Endometrial ablation (~ 2008); Esophagogastroduodenoscopy (2013); and EUS (N/A, 10/08/2015). Holly Mendez has a current medication list which includes the following prescription(s): albuterol, amitriptyline, budesonide-formoterol, carbamazepine, cetirizine, esomeprazole, fluticasone, ondansetron, polyethylene glycol,  aripiprazole, hydroxyzine, magnesium, methocarbamol, metronidazole, and naproxen, and the following Facility-Administered Medications: fentanyl, lactated ringers, and midazolam. Her primarily concern today is the Back Pain (lower)  Initial Vital Signs:  Pulse/HCG Rate: 82ECG Heart Rate: 88 Temp: 98 F (36.7 C) Resp: 17 BP: (!) 145/93 SpO2: 99 %  BMI: Estimated body mass index is 46.93 kg/m as calculated from the following:   Height as of this encounter: 5\' 5"  (1.651 m).   Weight as of this encounter: 282 lb (127.9 kg).  Risk Assessment: Allergies: Reviewed. She is allergic to norvasc [amlodipine besylate] and zithromax [azithromycin].  Allergy Precautions: None required Coagulopathies: Reviewed. None identified.  Blood-thinner therapy: None at this time Active Infection(s): Reviewed. None identified. Holly Mendez is afebrile  Site Confirmation: Holly Mendez was asked to confirm the procedure and laterality before marking the site Procedure checklist: Completed Consent: Before the procedure and under the influence of no sedative(s), amnesic(s), or anxiolytics, the patient was informed of the treatment options, risks and possible complications. To fulfill our ethical and legal obligations, as recommended by the American Medical Association's Code of Ethics, I have informed the patient of my clinical impression; the nature and purpose of the treatment or procedure; the risks, benefits, and possible complications of the intervention; the alternatives, including doing nothing; the risk(s) and benefit(s) of the alternative treatment(s) or procedure(s); and the risk(s) and benefit(s) of doing nothing. The patient was provided information about the general risks and possible complications associated with the procedure. These may include, but are not limited to: failure to achieve desired goals, infection, bleeding, organ or nerve damage, allergic reactions, paralysis, and death. In  addition, the patient was informed of those risks and complications associated to Spine-related procedures, such as failure to decrease pain; infection (i.e.: Meningitis, epidural or intraspinal abscess); bleeding (i.e.: epidural hematoma,  subarachnoid hemorrhage, or any other type of intraspinal or peri-dural bleeding); organ or nerve damage (i.e.: Any type of peripheral nerve, nerve root, or spinal cord injury) with subsequent damage to sensory, motor, and/or autonomic systems, resulting in permanent pain, numbness, and/or weakness of one or several areas of the body; allergic reactions; (i.e.: anaphylactic reaction); and/or death. Furthermore, the patient was informed of those risks and complications associated with the medications. These include, but are not limited to: allergic reactions (i.e.: anaphylactic or anaphylactoid reaction(s)); adrenal axis suppression; blood sugar elevation that in diabetics may result in ketoacidosis or comma; water retention that in patients with history of congestive heart failure may result in shortness of breath, pulmonary edema, and decompensation with resultant heart failure; weight gain; swelling or edema; medication-induced neural toxicity; particulate matter embolism and blood vessel occlusion with resultant organ, and/or nervous system infarction; and/or aseptic necrosis of one or more joints. Finally, the patient was informed that Medicine is not an exact science; therefore, there is also the possibility of unforeseen or unpredictable risks and/or possible complications that may result in a catastrophic outcome. The patient indicated having understood very clearly. We have given the patient no guarantees and we have made no promises. Enough time was given to the patient to ask questions, all of which were answered to the patient's satisfaction. Holly Mendez has indicated that she wanted to continue with the procedure. Attestation: I, the ordering provider, attest  that I have discussed with the patient the benefits, risks, side-effects, alternatives, likelihood of achieving goals, and potential problems during recovery for the procedure that I have provided informed consent. Date  Time: 07/05/2017  8:15 AM  Pre-Procedure Preparation:  Monitoring: As per clinic protocol. Respiration, ETCO2, SpO2, BP, heart rate and rhythm monitor placed and checked for adequate function Safety Precautions: Patient was assessed for positional comfort and pressure points before starting the procedure. Time-out: I initiated and conducted the "Time-out" before starting the procedure, as per protocol. The patient was asked to participate by confirming the accuracy of the "Time Out" information. Verification of the correct person, site, and procedure were performed and confirmed by me, the nursing staff, and the patient. "Time-out" conducted as per Joint Commission's Universal Protocol (UP.01.01.01). Time: 0917  Description of Procedure:          Position: Prone Laterality: Bilateral. The procedure was performed in identical fashion on both sides. Levels:  L2, L3, L4, L5, & S1 Medial Branch Level(s) Area Prepped: Posterior Lumbosacral Region Prepping solution: ChloraPrep (2% chlorhexidine gluconate and 70% isopropyl alcohol) Safety Precautions: Aspiration looking for blood return was conducted prior to all injections. At no point did we inject any substances, as a needle was being advanced. Before injecting, the patient was told to immediately notify me if she was experiencing any new onset of "ringing in the ears, or metallic taste in the mouth". No attempts were made at seeking any paresthesias. Safe injection practices and needle disposal techniques used. Medications properly checked for expiration dates. SDV (single dose vial) medications used. After the completion of the procedure, all disposable equipment used was discarded in the proper designated medical waste  containers. Local Anesthesia: Protocol guidelines were followed. The patient was positioned over the fluoroscopy table. The area was prepped in the usual manner. The time-out was completed. The target area was identified using fluoroscopy. A 12-in long, straight, sterile hemostat was used with fluoroscopic guidance to locate the targets for each level blocked. Once located, the skin was marked with an approved  surgical skin marker. Once all sites were marked, the skin (epidermis, dermis, and hypodermis), as well as deeper tissues (fat, connective tissue and muscle) were infiltrated with a small amount of a short-acting local anesthetic, loaded on a 10cc syringe with a 25G, 1.5-in  Needle. An appropriate amount of time was allowed for local anesthetics to take effect before proceeding to the next step. Local Anesthetic: Lidocaine 2.0% The unused portion of the local anesthetic was discarded in the proper designated containers. Technical explanation of process:  L2 Medial Branch Nerve Block (MBB): The target area for the L2 medial branch is at the junction of the postero-lateral aspect of the superior articular process and the superior, posterior, and medial edge of the transverse process of L3. Under fluoroscopic guidance, a Quincke needle was inserted until contact was made with os over the superior postero-lateral aspect of the pedicular shadow (target area). After negative aspiration for blood, 0.5 mL of the nerve block solution was injected without difficulty or complication. The needle was removed intact. L3 Medial Branch Nerve Block (MBB): The target area for the L3 medial branch is at the junction of the postero-lateral aspect of the superior articular process and the superior, posterior, and medial edge of the transverse process of L4. Under fluoroscopic guidance, a Quincke needle was inserted until contact was made with os over the superior postero-lateral aspect of the pedicular shadow (target area).  After negative aspiration for blood, 0.5 mL of the nerve block solution was injected without difficulty or complication. The needle was removed intact. L4 Medial Branch Nerve Block (MBB): The target area for the L4 medial branch is at the junction of the postero-lateral aspect of the superior articular process and the superior, posterior, and medial edge of the transverse process of L5. Under fluoroscopic guidance, a Quincke needle was inserted until contact was made with os over the superior postero-lateral aspect of the pedicular shadow (target area). After negative aspiration for blood, 0.5 mL of the nerve block solution was injected without difficulty or complication. The needle was removed intact. L5 Medial Branch Nerve Block (MBB): The target area for the L5 medial branch is at the junction of the postero-lateral aspect of the superior articular process and the superior, posterior, and medial edge of the sacral ala. Under fluoroscopic guidance, a Quincke needle was inserted until contact was made with os over the superior postero-lateral aspect of the pedicular shadow (target area). After negative aspiration for blood, 0.5 mL of the nerve block solution was injected without difficulty or complication. The needle was removed intact. S1 Medial Branch Nerve Block (MBB): The target area for the S1 medial branch is at the posterior and inferior 6 o'clock position of the L5-S1 facet joint. Under fluoroscopic guidance, the Quincke needle inserted for the L5 MBB was redirected until contact was made with os over the inferior and postero aspect of the sacrum, at the 6 o' clock position under the L5-S1 facet joint (Target area). After negative aspiration for blood, 0.5 mL of the nerve block solution was injected without difficulty or complication. The needle was removed intact. Procedural Needles: 22-gauge, 3.5-inch, Quincke needles used for all levels. Nerve block solution: 0.2% PF-Ropivacaine + Triamcinolone (40  mg/mL) diluted to a final concentration of 4 mg of Triamcinolone/mL of Ropivacaine The unused portion of the solution was discarded in the proper designated containers.  Once the entire procedure was completed, the treated area was cleaned, making sure to leave some of the prepping solution back to take  advantage of its long term bactericidal properties.   Illustration of the posterior view of the lumbar spine and the posterior neural structures. Laminae of L2 through S1 are labeled. DPRL5, dorsal primary ramus of L5; DPRS1, dorsal primary ramus of S1; DPR3, dorsal primary ramus of L3; FJ, facet (zygapophyseal) joint L3-L4; I, inferior articular process of L4; LB1, lateral branch of dorsal primary ramus of L1; IAB, inferior articular branches from L3 medial branch (supplies L4-L5 facet joint); IBP, intermediate branch plexus; MB3, medial branch of dorsal primary ramus of L3; NR3, third lumbar nerve root; S, superior articular process of L5; SAB, superior articular branches from L4 (supplies L4-5 facet joint also); TP3, transverse process of L3.  Vitals:   07/05/17 0918 07/05/17 0923 07/05/17 0928 07/05/17 0934  BP: (!) 145/100 (!) 141/95 131/87 (!) 104/92  Pulse:      Resp: 14 16 20 16   Temp:      SpO2: 96% 97% 99% 98%  Weight:      Height:        Start Time: 0917 hrs. End Time: 0932 hrs.  Imaging Guidance (Spinal):          Type of Imaging Technique: Fluoroscopy Guidance (Spinal) Indication(s): Assistance in needle guidance and placement for procedures requiring needle placement in or near specific anatomical locations not easily accessible without such assistance. Exposure Time: Please see nurses notes. Contrast: None used. Fluoroscopic Guidance: I was personally present during the use of fluoroscopy. "Tunnel Vision Technique" used to obtain the best possible view of the target area. Parallax error corrected before commencing the procedure. "Direction-depth-direction" technique used to  introduce the needle under continuous pulsed fluoroscopy. Once target was reached, antero-posterior, oblique, and lateral fluoroscopic projection used confirm needle placement in all planes. Images permanently stored in EMR. Interpretation: No contrast injected. I personally interpreted the imaging intraoperatively. Adequate needle placement confirmed in multiple planes. Permanent images saved into the patient's record.  Antibiotic Prophylaxis:   Anti-infectives (From admission, onward)   None     Indication(s): None identified  Post-operative Assessment:  Post-procedure Vital Signs:  Pulse/HCG Rate: 8277 Temp: 98 F (36.7 C) Resp: 16 BP: (!) 104/92 SpO2: 98 %  EBL: None  Complications: No immediate post-treatment complications observed by team, or reported by patient.  Note: The patient tolerated the entire procedure well. A repeat set of vitals were taken after the procedure and the patient was kept under observation following institutional policy, for this type of procedure. Post-procedural neurological assessment was performed, showing return to baseline, prior to discharge. The patient was provided with post-procedure discharge instructions, including a section on how to identify potential problems. Should any problems arise concerning this procedure, the patient was given instructions to immediately contact us, at any time, without hesitation. In any case, we plan to contact the patient by telephone for a follow-up status report regarding this interventional procedure.  Comments:  No additional relevant information.  Plan of Care    Imaging Orders     DG C-Arm 1-60 Min-No Report  Procedure Orders     LUMBAR FACET(MEDIAL BRANCH NERVE BLOCK) MBNB  Medications ordered for procedure: Meds ordered this encounter  Medications  . lidocaine (XYLOCAINE) 2 % (with pres) injection 400 mg  . midazolam (VERSED) 5 MG/5ML injection 1-2 mg    Make sure Flumazenil is available in the  pyxis when using this medication. If oversedation occurs, administer 0.2 mg IV over 15 sec. If after 45 sec no response, administer 0.2 mg again over  1 min; may repeat at 1 min intervals; not to exceed 4 doses (1 mg)  . fentaNYL (SUBLIMAZE) injection 25-50 mcg    Make sure Narcan is available in the pyxis when using this medication. In the event of respiratory depression (RR< 8/min): Titrate NARCAN (naloxone) in increments of 0.1 to 0.2 mg IV at 2-3 minute intervals, until desired degree of reversal.  . lactated ringers infusion 1,000 mL  . ropivacaine (PF) 2 mg/mL (0.2%) (NAROPIN) injection 18 mL  . triamcinolone acetonide (KENALOG-40) injection 80 mg   Medications administered: We administered lidocaine, ropivacaine (PF) 2 mg/mL (0.2%), and triamcinolone acetonide.  See the medical record for exact dosing, route, and time of administration.  New Prescriptions   HYDROXYZINE (ATARAX/VISTARIL) 10 MG TABLET    Take 1 tablet (10 mg total) by mouth 3 (three) times daily as needed.   Disposition: Discharge home  Discharge Date & Time: 07/05/2017; 0937 hrs.   Physician-requested Follow-up: Return for post-procedure eval (2 wks), w/ Dr. Dossie Arbour.  Future Appointments  Date Time Provider [place]  07/06/2017  8:20 AM [facility] MM 3 [facility]  07/09/2017  9:55 AM Emily Filbert, MD [facility]  07/18/2017 10:50 AM Dickie La, MD [facility]  07/25/2017 11:15 AM Milinda Pointer, MD [facility] None   Primary Care Physician: Dickie La, MD Location: [facility] Out[facility] Pain Management Facility Note by: Gaspar Cola, MD Date: 07/05/2017; Time: 10:56 AM  Disclaimer:  Medicine is not an exact science. The only guarantee in medicine is that nothing is guaranteed. It is important to note that the decision to proceed with this intervention was based on the information collected from the patient. The Data and conclusions were drawn from the patient's questionnaire, the interview,  and the physical examination. Because the information was provided in large part by the patient, it cannot be guaranteed that it has not been purposely or unconsciously manipulated. Every effort has been made to obtain as much relevant data as possible for this evaluation. It is important to note that the conclusions that lead to this procedure are derived in large part from the available data. Always take into account that the treatment will also be dependent on availability of resources and existing treatment guidelines, considered by other Pain Management Practitioners as being common knowledge and practice, at the time of the intervention. For Medico-Legal purposes, it is also important to point out that variation in procedural techniques and pharmacological choices are the acceptable norm. The indications, contraindications, technique, and results of the above procedure should only be interpreted and judged by a Board-Certified Interventional Pain Specialist with extensive familiarity and expertise in the same exact procedure and technique.

## 2017-07-05 NOTE — Telephone Encounter (Signed)
Informed pt of below. Zimmerman Rumple, Heddy Vidana D, CMA  

## 2017-07-05 NOTE — Patient Instructions (Signed)

## 2017-07-06 ENCOUNTER — Ambulatory Visit
Admission: RE | Admit: 2017-07-06 | Discharge: 2017-07-06 | Disposition: A | Discharge status: Home / Self Care | Payer: Medicaid Other | Source: Ambulatory Visit | Attending: Family Medicine | Admitting: Family Medicine

## 2017-07-06 ENCOUNTER — Ambulatory Visit: Payer: Medicaid Other

## 2017-07-06 ENCOUNTER — Telehealth: Payer: Self-pay

## 2017-07-06 ENCOUNTER — Other Ambulatory Visit: Payer: Self-pay

## 2017-07-06 DIAGNOSIS — Z1231 Encounter for screening mammogram for malignant neoplasm of breast: Secondary | ICD-10-CM

## 2017-07-06 MED ORDER — HYDROXYZINE HCL 10 MG PO TABS: 10.0000 mg | ORAL_TABLET | Freq: Three times a day (TID) | ORAL | 0 refills | Status: DC | PRN

## 2017-07-06 NOTE — Telephone Encounter (Signed)
Post Procedure phone call.  LM 

## 2017-07-06 NOTE — Telephone Encounter (Signed)
Rx for Hydroxizine sent to wrong pharmacy. Re-sent to [facility] and incorrect pharmacy removed. Danley Danker, RN [facility] RN)

## 2017-07-09 ENCOUNTER — Encounter: Payer: Self-pay | Admitting: Obstetrics & Gynecology

## 2017-07-09 ENCOUNTER — Ambulatory Visit (INDEPENDENT_AMBULATORY_CARE_PROVIDER_SITE_OTHER): Payer: Medicaid Other | Admitting: Obstetrics & Gynecology

## 2017-07-09 VITALS — BP 144/93 | HR 89 | Ht 65.0 in | Wt 265.0 lb

## 2017-07-09 DIAGNOSIS — D219 Benign neoplasm of connective and other soft tissue, unspecified: Secondary | ICD-10-CM | POA: Diagnosis present
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
## 2017-07-09 NOTE — Progress Notes (Signed)
Patient had elevated GAD7- Dr Hulan Fray aware. Patient has provider she sees for anxiety/depression at [facility], has appt tomorrow.

## 2017-07-16 ENCOUNTER — Encounter: Payer: Medicaid Other | Admitting: Obstetrics & Gynecology

## 2017-07-16 ENCOUNTER — Encounter

## 2017-07-17 ENCOUNTER — Other Ambulatory Visit: Payer: Self-pay | Admitting: Family Medicine

## 2017-07-18 ENCOUNTER — Other Ambulatory Visit: Payer: Self-pay

## 2017-07-18 ENCOUNTER — Encounter: Payer: Self-pay | Admitting: Family Medicine

## 2017-07-18 ENCOUNTER — Ambulatory Visit (INDEPENDENT_AMBULATORY_CARE_PROVIDER_SITE_OTHER): Payer: Medicaid Other | Admitting: Family Medicine

## 2017-07-18 VITALS — BP 118/86 | Temp 98.7°F | Ht 65.0 in | Wt 263.4 lb

## 2017-07-18 DIAGNOSIS — F3170 Bipolar disorder, currently in remission, most recent episode unspecified: Secondary | ICD-10-CM

## 2017-07-18 DIAGNOSIS — N760 Acute vaginitis: Secondary | ICD-10-CM

## 2017-07-18 DIAGNOSIS — R1013 Epigastric pain: Secondary | ICD-10-CM | POA: Diagnosis not present

## 2017-07-18 DIAGNOSIS — B9689 Other specified bacterial agents as the cause of diseases classified elsewhere: Secondary | ICD-10-CM | POA: Diagnosis not present

## 2017-07-18 DIAGNOSIS — G8929 Other chronic pain: Secondary | ICD-10-CM | POA: Diagnosis not present

## 2017-07-18 MED ORDER — METRONIDAZOLE 500 MG PO TABS: ORAL_TABLET | ORAL | 3 refills | Status: DC

## 2017-07-18 MED ORDER — POLYETHYLENE GLYCOL 3350 17 G PO PACK: PACK | ORAL | 3 refills | Status: DC

## 2017-07-20 NOTE — Assessment & Plan Note (Signed)
She is being followed by [facility] mental health and urged her to continue regular follow-up for that.

## 2017-07-20 NOTE — Progress Notes (Addendum)
    CHIEF COMPLAINT / HPI: Follow-up chronic abdominal pain, recurrent episodes and recent work-up via gastroenterology.  She has a lot of questions.  She has been taking her MiraLAX daily with some mild improvement.  She still has a lot of bloating, intermittent epigastric pain.  Was treated in the hospital for bacterial vaginosis.  She gets this about every month.  Symptoms started 2 to 3 days before her cycle.  Monogamous by her report with her husband.  REVIEW OF SYSTEMS: Thick vaginal discharge improving since her treatment.  Denies chest pain.  Denies current abdominal pain.  No unusual weight change.  Has had some unusual increase in her depressive symptoms and they have adjusted her medications through [facility] mental health.  PERTINENT  PMH / PSH: I have reviewed the patient's medications, allergies, past medical and surgical history, smoking status and updated in the EMR as appropriate.   OBJECTIVE:  Vital signs reviewed. GENERAL: Well-developed, well-nourished, no acute distress. CARDIOVASCULAR: Regular rate and rhythm no murmur gallop or rub LUNGS: Clear to auscultation bilaterally, no rales or wheeze. ABDOMEN: Soft positive bowel sounds, no tenderness to palpation or decompression.  No rebound or guarding.  No masses are noted although there is body habitus. NEURO: No gross focal neurological deficits except unilateral visual loss secondary to prior trauma MSK: Movement of extremity x 4. Psych: Alert and oriented x4.  Speech is normal and content and fluency.  Recent and remote memory is intact.  No hallucinations.  No psychomotor retardation or agitation.    ASSESSMENT / PLAN:  Abdominal pain, chronic, epigastric I reviewed all of her recent work-up with gastroenterology and her last hospitalization.  It sounds like she has multifactorial issues including constipation, slow transit, irritable bowel.  We discussed fiber supplementation and I reiterated how important it is to  have a regular dose daily.  I will refill that for her.  We spent greater than 50% of our 25-minute office visit in counseling and education, reviewing these lab results and tests, coordination of care.  Bipolar affective disorder, currently in remission [facility]) She is being followed by [facility] mental health and urged her to continue regular follow-up for that.  Bacterial vaginosis, recurrent She is completing treatment with Flagyl but was started on her last hospital stay.  Sounds like her symptoms are resolved for the moment.  Given the chronicity of this, I will treat her once a month with Flagyl to see if we can decreased her recurrent symptoms.  Prescription is called in

## 2017-07-20 NOTE — Assessment & Plan Note (Signed)
I reviewed all of her recent work-up with gastroenterology and her last hospitalization.  It sounds like she has multifactorial issues including constipation, slow transit, irritable bowel.  We discussed fiber supplementation and I reiterated how important it is to have a regular dose daily.  I will refill that for her.  We spent greater than 50% of our 25-minute office visit in counseling and education, reviewing these lab results and tests, coordination of care.

## 2017-07-20 NOTE — Assessment & Plan Note (Signed)
She is completing treatment with Flagyl but was started on her last hospital stay.  Sounds like her symptoms are resolved for the moment.  Given the chronicity of this, I will treat her once a month with Flagyl to see if we can decreased her recurrent symptoms.  Prescription is called in

## 2017-07-24 NOTE — Progress Notes (Signed)
Patient's Name: Holly Mendez  MRN: [id number]  Referring Provider: Dickie La, MD  DOB: January 15, 1975  PCP: Dickie La, MD  DOS: 07/25/2017  Note by: Gaspar Cola, MD  Service setting: Ambulatory outpatient  Specialty: Interventional Pain Management  Location: [facility] (AMB) Pain Management Facility    Patient type: Established   Primary Reason(s) for Visit: Encounter for post-procedure evaluation of chronic illness with mild to moderate exacerbation CC: Back Pain (lumbar bilateral )  HPI  Holly Mendez is a 42 y.o. year old, female patient, who comes today for a post-procedure evaluation. She has Bipolar affective disorder, currently in remission [facility]); HTN (hypertension); Meningitis, hx, 2016; Headache; Morbid obesity ([place]); S/P endometrial ablation; Hyperglycemia; [place] Anterior communicating artery aneurysm; Blind left eye; Abdominal pain, chronic, epigastric; Constipation; Bacterial vaginosis, recurrent; Screening for malignant neoplasm of cervix; Asthma; Right thigh pain; Seasonal allergies; Chronic low back pain (Primary Area of Pain) (Bilateral) (R>L) with right-sided sciatica; Chronic lower extremity pain (Secondary Area of Pain) (Right); Chronic abdominal pain [facility] Area of Pain); Chronic upper back pain (Fourth Area of Pain); Chronic upper extremity pain; Chronic pain disorder; Long term current use of opiate analgesic; Pharmacologic therapy; Disorder of skeletal system; Problems influencing health status; DDD (degenerative disc disease), cervical; Osteoarthritis of AC (acromioclavicular) joints (Bilateral); Osteoarthritis of glenohumeral joint (Bilateral); Osteoarthritis of shoulder (Bilateral); Idiopathic scoliosis of thoracic spine; Lumbar facet hypertrophy (Bilateral); Lumbar facet syndrome (Bilateral); DDD (degenerative disc disease), lumbar; Spondylosis without myelopathy or radiculopathy, lumbosacral region; Chronic shoulder pain (Bilateral); Vitamin D  deficiency; Osteoarthritis; Chronic musculoskeletal pain; Intractable nausea and vomiting; and Chronic low back pain (Bilateral) (R>L) w/o sciatica on their problem list. Her primarily concern today is the Back Pain (lumbar bilateral )  Pain Assessment: Location: Lower, Left, Right Back Radiating: down the right leg to approx midway in the back of the leg  Onset: More than a month ago Duration: Chronic pain Quality: Aching, Dull, Discomfort, Constant Severity: 5 /10 (subjective, self-reported pain score)  Note: Reported level is inconsistent with clinical observations. Clinically the patient looks like a 2/10 A 2/10 is viewed as "Mild to Moderate" and described as noticeable and distracting. Impossible to hide from other people. More frequent flare-ups. Still possible to adapt and function close to normal. It can be very annoying and may have occasional stronger flare-ups. With discipline, patients may get used to it and adapt. Holly Mendez does not seem to understand the use of our objective pain scale When using our objective Pain Scale, levels between 6 and 10/10 are said to belong in an emergency room, as it progressively worsens from a 6/10, described as severely limiting, requiring emergency care not usually available at an outpatient pain management facility. At a 6/10 level, communication becomes difficult and requires great effort. Assistance to reach the emergency department may be required. Facial flushing and profuse sweating along with potentially dangerous increases in heart rate and blood pressure will be evident. Effect on ADL: very limiting to her day to day.  Timing: Constant Modifying factors: sitting straight up or hot shower BP: 119/82  HR: 88  Holly Mendez comes in today for post-procedure evaluation after the treatment done on 07/06/2017. She is hesitant to have more interventional therapies.  Further details on both, my assessment(s), as well as the proposed  treatment plan, please see below.  Post-Procedure Assessment  07/05/2017 Procedure: Diagnosticbilateral lumbar facet nerve block #1 under fluoroscopic guidance and IV sedation Pre-procedure pain score:  5/10 Post-procedure pain score: 0/10 (100%  relief) Influential Factors: BMI: 44.20 kg/m Intra-procedural challenges: None observed.         Assessment challenges: None detected.              Reported side-effects: None.        Post-procedural adverse reactions or complications: None reported         Sedation: No sedation used. When no sedatives are used, the analgesic levels obtained are directly associated to the effectiveness of the local anesthetics. However, when sedation is provided, the level of analgesia obtained during the initial 1 hour following the intervention, is believed to be the result of a combination of factors. These factors may include, but are not limited to: 1. The effectiveness of the local anesthetics used. 2. The effects of the analgesic(s) and/or anxiolytic(s) used. 3. The degree of discomfort experienced by the patient at the time of the procedure. 4. The patients ability and reliability in recalling and recording the events. 5. The presence and influence of possible secondary gains and/or psychosocial factors. Reported result: Relief experienced during the 1st hour after the procedure: 100 % (Ultra-Short Term Relief) Holly Mendez has indicated area to have been numb during this time. Interpretative annotation: Clinically appropriate result. No IV Analgesic or Anxiolytic given, therefore benefits are completely due to Local Anesthetic effects.          Effects of local anesthetic: The analgesic effects attained during this period are directly associated to the localized infiltration of local anesthetics and therefore cary significant diagnostic value as to the etiological location, or anatomical origin, of the pain. Expected duration of relief is directly  dependent on the pharmacodynamics of the local anesthetic used. Long-acting (4-6 hours) anesthetics used.  Reported result: Relief during the next 4 to 6 hour after the procedure: 100 % (Short-Term Relief) Holly Mendez has indicated area to have been numb during this time. Interpretative annotation: Clinically appropriate result. Analgesia during this period is likely to be Local Anesthetic-related.          Long-term benefit: Defined as the period of time past the expected duration of local anesthetics (1 hour for short-acting and 4-6 hours for long-acting). With the possible exception of prolonged sympathetic blockade from the local anesthetics, benefits during this period are typically attributed to, or associated with, other factors such as analgesic sensory neuropraxia, antiinflammatory effects, or beneficial biochemical changes provided by agents other than the local anesthetics.  Reported result: Extended relief following procedure: 100 %(patient states the pain was relieved x 2 days. ) (Long-Term Relief)            Interpretative annotation: Clinically appropriate result. Good relief. No permanent benefit expected. Inflammation plays a part in the etiology to the pain.          Current benefits: Defined as reported results that persistent at this point in time.   Analgesia: <50 %            Function: Somewhat improved ROM: Somewhat improved Interpretative annotation: Recurrence of symptoms. No permanent benefit expected. Effective diagnostic intervention.          Interpretation: Results would suggest a successful diagnostic intervention.                  Plan:  Consider diagnostic procedure No.: 2         Has had PT for the back at the [place] practice. Unfortunately it did not help.   Laboratory Chemistry  Inflammation Markers (CRP: Acute Phase) (  ESR: Chronic Phase) Lab Results  Component Value Date   CRP 6.1 (H) 04/04/2017   ESRSEDRATE 21 04/04/2017    LATICACIDVEN 0.96 11/16/2015                         Renal Markers Lab Results  Component Value Date   BUN 6 06/19/2017   CREATININE 0.85 06/19/2017   BCR 10 04/04/2017   GFRAA >60 06/19/2017   GFRNONAA >60 06/19/2017                             Hepatic Markers Lab Results  Component Value Date   AST 57 (H) 06/19/2017   ALT 44 06/19/2017   ALBUMIN 3.8 06/19/2017                        Neuropathy Markers Lab Results  Component Value Date   HGBA1C 6.3 (H) 11/18/2015   HIV Non Reactive 06/18/2017                        Hematology Parameters Lab Results  Component Value Date   INR 1.15 09/22/2014   LABPROT 14.9 09/22/2014   PLT 331 06/19/2017   HGB 13.1 06/19/2017   HCT 40.0 06/19/2017                        CV Markers Lab Results  Component Value Date   BNP 34.8 08/11/2014   TROPONINI <0.03 11/16/2015                         Note: Lab results reviewed.  Recent Diagnostic Imaging Results  MM 3D SCREEN BREAST BILATERAL CLINICAL DATA:  Screening.  EXAM: DIGITAL SCREENING BILATERAL MAMMOGRAM WITH TOMO AND CAD  COMPARISON:  Previous exam(s).  ACR Breast Density Category b: There are scattered areas of fibroglandular density.  FINDINGS: There are no findings suspicious for malignancy. Images were processed with CAD.  IMPRESSION: No mammographic evidence of malignancy. A result letter of this screening mammogram will be mailed directly to the patient.  RECOMMENDATION: Screening mammogram in one year. (Code:[id number]-B-01Y)  BI-RADS CATEGORY  1: Negative.  Electronically Signed   By: Ammie Ferrier M.D.   On: 07/06/2017 14:09  Complexity Note: I personally reviewed the fluoroscopic imaging of the procedure.                        Meds   Current Outpatient Medications:  .  albuterol (PROAIR HFA) 108 (90 Base) MCG/ACT inhaler, Inhale 2 puffs into the lungs every 6 (six) hours as needed for wheezing or shortness of breath. (200/8=25), Disp: 8.5 g, Rfl:  12 .  amitriptyline (ELAVIL) 25 MG tablet, Take 2 tablets by mouth at bedtime (Patient taking differently: Take 75 mg by mouth at bedtime. Take 3 tablets by mouth at bedtime), Disp: 60 tablet, Rfl: 1 .  budesonide-formoterol (SYMBICORT) 80-4.5 MCG/ACT inhaler, INHALE TWO PUFFS INTO THE LUNGS TWICE DAILY, Disp: 10.2 g, Rfl: 2 .  cetirizine (ZYRTEC) 10 MG tablet, Take 1 tablet (10 mg total) by mouth daily., Disp: 90 tablet, Rfl: 3 .  esomeprazole (NEXIUM) 40 MG capsule, Take 40 mg by mouth daily before breakfast., Disp: 30 capsule, Rfl: 3 .  fluticasone (FLONASE) 50 MCG/ACT nasal spray, Place 2 sprays into both nostrils daily as  needed for allergies or rhinitis., Disp: 16 g, Rfl: 12 .  hydrOXYzine (ATARAX/VISTARIL) 10 MG tablet, Take 1 tablet (10 mg total) by mouth 3 (three) times daily as needed., Disp: 30 tablet, Rfl: 0 .  LATUDA 60 MG TABS, Take 60 mg by mouth daily., Disp: , Rfl: 0 .  metroNIDAZOLE (FLAGYL) 500 MG tablet, Take 4 tabs by mouth once every month as diected, Disp: 12 tablet, Rfl: 3 .  ondansetron (ZOFRAN-ODT) 4 MG disintegrating tablet, ONE tablet every FOUR hours AS NEEDED FOR nausea vomting, Disp: 30 tablet, Rfl: 3 .  polyethylene glycol (MIRALAX / GLYCOLAX) packet, Take one or two packets daily by mouth as directed for constipation, Disp: 200 each, Rfl: 3 .  lurasidone (LATUDA) 20 MG TABS tablet, Take by mouth. Per psychiatry take 3 tabs p.o. daily, Disp: , Rfl:   ROS  Constitutional: Denies any fever or chills Gastrointestinal: No reported hemesis, hematochezia, vomiting, or acute GI distress Musculoskeletal: Denies any acute onset joint swelling, redness, loss of ROM, or weakness Neurological: No reported episodes of acute onset apraxia, aphasia, dysarthria, agnosia, amnesia, paralysis, loss of coordination, or loss of consciousness  Allergies  Holly Mendez is allergic to naproxen; norvasc [amlodipine besylate]; and zithromax [azithromycin].  PFSH  Drug: Ms.  Mendez  reports that she has current or past drug history. Drug: "Crack" cocaine. Alcohol:  reports that she does not drink alcohol. Tobacco:  reports that she has been smoking cigarettes.  She has smoked for the past 1.00 year. She has never used smokeless tobacco. Medical:  has a past medical history of Arthritis, Asthma, Bipolar disorder ([place]), Blind left eye (1980), Chronic lower back pain, Chronic pancreatitis ([place]), Drug-seeking behavior, Fibroids (06/19/2017), Fibromyalgia, GERD (gastroesophageal reflux disease), Hypercholesterolemia, Hypertension, and Schizoaffective disorder. Surgical: Holly Mendez  has a past surgical history that includes Esophagogastroduodenoscopy (02/14/2011); Dilation and curettage of uterus (2003); Tubal ligation (1998); Eye surgery (Left, 1980 X 2); Endometrial ablation (~ 2008); Esophagogastroduodenoscopy (2013); and EUS (N/A, 10/08/2015). Family: family history includes Aneurysm in her mother; Cancer in her other.  Constitutional Exam  General appearance: Well nourished, well developed, and well hydrated. In no apparent acute distress Vitals:   07/25/17 1051  BP: 119/82  Pulse: 88  Resp: 16  Temp: 98.3 F (36.8 C)  TempSrc: Oral  SpO2: 100%  Weight: 265 lb 9.6 oz (120.5 kg)  Height: _0  (1.651 m)   BMI Assessment: Estimated body mass index is 44.2 kg/m as calculated from the following:   Height as of this encounter: _1  (1.651 m).   Weight as of this encounter: 265 lb 9.6 oz (120.5 kg).  BMI interpretation table: BMI level Category Range association with higher incidence of chronic pain  <18 kg/m2 Underweight   18.5-24.9 kg/m2 Ideal body weight   25-29.9 kg/m2 Overweight Increased incidence by 20%  30-34.9 kg/m2 Obese (Class I) Increased incidence by 68%  35-39.9 kg/m2 Severe obesity (Class II) Increased incidence by 136%  >40 kg/m2 Extreme obesity (Class III) Increased incidence by 254%   Patient's current BMI Ideal Body weight   Body mass index is 44.2 kg/m. Ideal body weight: 57 kg (125 lb 10.6 oz) Adjusted ideal body weight: 82.4 kg (181 lb 10.2 oz)   BMI Readings from Last 4 Encounters:  07/25/17 44.20 kg/m  07/18/17 43.83 kg/m  07/09/17 44.10 kg/m  07/05/17 46.93 kg/m   Wt Readings from Last 4 Encounters:  07/25/17 265 lb 9.6 oz (120.5 kg)  07/18/17 263 lb 6.4 oz (119.5 kg)  07/09/17 265 lb (120.2 kg)  07/05/17 282 lb (127.9 kg)  Psych/Mental status: Alert, oriented x 3 (person, place, & time)       Eyes: PERLA Respiratory: No evidence of acute respiratory distress  Cervical Spine Area Exam  Skin & Axial Inspection: No masses, redness, edema, swelling, or associated skin lesions Alignment: Symmetrical Functional ROM: Unrestricted ROM      Stability: No instability detected Muscle Tone/Strength: Functionally intact. No obvious neuro-muscular anomalies detected. Sensory (Neurological): Unimpaired Palpation: No palpable anomalies              Upper Extremity (UE) Exam    Side: Right upper extremity  Side: Left upper extremity  Skin & Extremity Inspection: Skin color, temperature, and hair growth are WNL. No peripheral edema or cyanosis. No masses, redness, swelling, asymmetry, or associated skin lesions. No contractures.  Skin & Extremity Inspection: Skin color, temperature, and hair growth are WNL. No peripheral edema or cyanosis. No masses, redness, swelling, asymmetry, or associated skin lesions. No contractures.  Functional ROM: Unrestricted ROM          Functional ROM: Unrestricted ROM          Muscle Tone/Strength: Functionally intact. No obvious neuro-muscular anomalies detected.  Muscle Tone/Strength: Functionally intact. No obvious neuro-muscular anomalies detected.  Sensory (Neurological): Unimpaired          Sensory (Neurological): Unimpaired          Palpation: No palpable anomalies              Palpation: No palpable anomalies              Provocative Test(s):  Phalen's test:  deferred Tinel's test: deferred Apley's scratch test (touch opposite shoulder):  Action 1 (Across chest): deferred Action 2 (Overhead): deferred Action 3 (LB reach): deferred   Provocative Test(s):  Phalen's test: deferred Tinel's test: deferred Apley's scratch test (touch opposite shoulder):  Action 1 (Across chest): deferred Action 2 (Overhead): deferred Action 3 (LB reach): deferred    Thoracic Spine Area Exam  Skin & Axial Inspection: No masses, redness, or swelling Alignment: Symmetrical Functional ROM: Unrestricted ROM Stability: No instability detected Muscle Tone/Strength: Functionally intact. No obvious neuro-muscular anomalies detected. Sensory (Neurological): Unimpaired Muscle strength & Tone: No palpable anomalies  Lumbar Spine Area Exam  Skin & Axial Inspection: No masses, redness, or swelling Alignment: Symmetrical Functional ROM: Decreased ROM       Stability: No instability detected Muscle Tone/Strength: Increased muscle tone over affected area Sensory (Neurological): Movement-associated pain Palpation: Complains of area being tender to palpation       Provocative Tests: Lumbar Hyperextension/rotation test: (+) bilaterally for facet joint pain. Lumbar quadrant test (Kemp's test): (+) bilaterally for facet joint pain. Lumbar Lateral bending test: deferred today       Patrick's Maneuver: deferred today                   FABER test: deferred today                   Thigh-thrust test: deferred today       S-I compression test: deferred today       S-I distraction test: deferred today        Gait & Posture Assessment  Ambulation: Unassisted Gait: Relatively normal for age and body habitus Posture: WNL   Lower Extremity Exam    Side: Right lower extremity  Side: Left lower extremity  Stability: No instability observed  Stability: No instability observed          Skin & Extremity Inspection: Skin color, temperature, and hair growth are WNL. No  peripheral edema or cyanosis. No masses, redness, swelling, asymmetry, or associated skin lesions. No contractures.  Skin & Extremity Inspection: Skin color, temperature, and hair growth are WNL. No peripheral edema or cyanosis. No masses, redness, swelling, asymmetry, or associated skin lesions. No contractures.  Functional ROM: Unrestricted ROM                  Functional ROM: Unrestricted ROM                  Muscle Tone/Strength: Functionally intact. No obvious neuro-muscular anomalies detected.  Muscle Tone/Strength: Functionally intact. No obvious neuro-muscular anomalies detected.  Sensory (Neurological): Unimpaired  Sensory (Neurological): Unimpaired  Palpation: No palpable anomalies  Palpation: No palpable anomalies   Assessment  Primary Diagnosis & Pertinent Problem List: The primary encounter diagnosis was Spondylosis without myelopathy or radiculopathy, lumbosacral region. Diagnoses of Lumbar facet syndrome (Bilateral), Lumbar facet hypertrophy (Bilateral), and Chronic low back pain (Primary Area of Pain) (Bilateral) (R>L) with right-sided sciatica were also pertinent to this visit.  Status Diagnosis  Improving Improving Improving 1. Spondylosis without myelopathy or radiculopathy, lumbosacral region   2. Lumbar facet syndrome (Bilateral)   3. Lumbar facet hypertrophy (Bilateral)   4. Chronic low back pain (Primary Area of Pain) (Bilateral) (R>L) with right-sided sciatica     Problems updated and reviewed during this visit: No problems updated. Plan of Care  Pharmacotherapy (Medications Ordered): No orders of the defined types were placed in this encounter.  Medications administered today: Holly Mendez had no medications administered during this visit.   Procedure Orders     LUMBAR FACET(MEDIAL BRANCH NERVE BLOCK) MBNB Lab Orders  No laboratory test(s) ordered today   Imaging Orders  No imaging studies ordered today   Referral Orders  No referral(s)  requested today   Interventional management options: Planned, scheduled, and/or pending:   Diagnosticbilateral lumbar facet nerve block #2 under fluoroscopic guidance and IV sedation   Considering:   Diagnosticright-sided LESI  Diagnosticbilateral lumbar facet nerve block  Possible bilateral lumbar facet RFA  Trigger point injection    Palliative PRN treatment(s):   Diagnostic L-FCT BLK #2    Provider-requested follow-up: Return for Procedure (w/ sedation): (B) L-FCT BLK #2.  Future Appointments  Date Time Provider [place]  07/30/2017  9:00 AM WH-US 1 WH-US 203  08/20/2017  8:35 AM Emily Filbert, MD [place]   Primary Care Physician: Dickie La, MD Location: [facility] Note by: Gaspar Cola, MD Date: 07/25/2017; Time: 11:39 AM

## 2017-07-25 ENCOUNTER — Encounter: Payer: Self-pay | Admitting: Pain Medicine

## 2017-07-25 ENCOUNTER — Ambulatory Visit: Payer: Medicaid Other | Attending: Pain Medicine | Admitting: Pain Medicine

## 2017-07-25 VITALS — BP 119/82 | HR 88 | Temp 98.3°F | Resp 16 | Ht 65.0 in | Wt 265.6 lb

## 2017-07-25 DIAGNOSIS — J45909 Unspecified asthma, uncomplicated: Secondary | ICD-10-CM | POA: Diagnosis not present

## 2017-07-25 DIAGNOSIS — H5462 Unqualified visual loss, left eye, normal vision right eye: Secondary | ICD-10-CM | POA: Insufficient documentation

## 2017-07-25 DIAGNOSIS — M545 Low back pain: Secondary | ICD-10-CM | POA: Diagnosis present

## 2017-07-25 DIAGNOSIS — M47819 Spondylosis without myelopathy or radiculopathy, site unspecified: Secondary | ICD-10-CM | POA: Insufficient documentation

## 2017-07-25 DIAGNOSIS — Z886 Allergy status to analgesic agent status: Secondary | ICD-10-CM | POA: Insufficient documentation

## 2017-07-25 DIAGNOSIS — M5441 Lumbago with sciatica, right side: Secondary | ICD-10-CM | POA: Diagnosis not present

## 2017-07-25 DIAGNOSIS — M19012 Primary osteoarthritis, left shoulder: Secondary | ICD-10-CM | POA: Insufficient documentation

## 2017-07-25 DIAGNOSIS — N760 Acute vaginitis: Secondary | ICD-10-CM | POA: Insufficient documentation

## 2017-07-25 DIAGNOSIS — E7800 Pure hypercholesterolemia, unspecified: Secondary | ICD-10-CM | POA: Diagnosis not present

## 2017-07-25 DIAGNOSIS — Z79891 Long term (current) use of opiate analgesic: Secondary | ICD-10-CM | POA: Insufficient documentation

## 2017-07-25 DIAGNOSIS — R51 Headache: Secondary | ICD-10-CM | POA: Insufficient documentation

## 2017-07-25 DIAGNOSIS — M19011 Primary osteoarthritis, right shoulder: Secondary | ICD-10-CM | POA: Diagnosis not present

## 2017-07-25 DIAGNOSIS — Z79899 Other long term (current) drug therapy: Secondary | ICD-10-CM | POA: Insufficient documentation

## 2017-07-25 DIAGNOSIS — M79651 Pain in right thigh: Secondary | ICD-10-CM | POA: Insufficient documentation

## 2017-07-25 DIAGNOSIS — G8929 Other chronic pain: Secondary | ICD-10-CM | POA: Insufficient documentation

## 2017-07-25 DIAGNOSIS — M47817 Spondylosis without myelopathy or radiculopathy, lumbosacral region: Secondary | ICD-10-CM | POA: Diagnosis not present

## 2017-07-25 DIAGNOSIS — I10 Essential (primary) hypertension: Secondary | ICD-10-CM | POA: Diagnosis not present

## 2017-07-25 DIAGNOSIS — I671 Cerebral aneurysm, nonruptured: Secondary | ICD-10-CM | POA: Insufficient documentation

## 2017-07-25 DIAGNOSIS — K861 Other chronic pancreatitis: Secondary | ICD-10-CM | POA: Insufficient documentation

## 2017-07-25 DIAGNOSIS — M47816 Spondylosis without myelopathy or radiculopathy, lumbar region: Secondary | ICD-10-CM | POA: Diagnosis not present

## 2017-07-25 DIAGNOSIS — R739 Hyperglycemia, unspecified: Secondary | ICD-10-CM | POA: Insufficient documentation

## 2017-07-25 DIAGNOSIS — F259 Schizoaffective disorder, unspecified: Secondary | ICD-10-CM | POA: Insufficient documentation

## 2017-07-25 DIAGNOSIS — Z881 Allergy status to other antibiotic agents status: Secondary | ICD-10-CM | POA: Diagnosis not present

## 2017-07-25 DIAGNOSIS — K219 Gastro-esophageal reflux disease without esophagitis: Secondary | ICD-10-CM | POA: Insufficient documentation

## 2017-07-25 DIAGNOSIS — M797 Fibromyalgia: Secondary | ICD-10-CM | POA: Insufficient documentation

## 2017-07-25 DIAGNOSIS — K5900 Constipation, unspecified: Secondary | ICD-10-CM | POA: Insufficient documentation

## 2017-07-25 DIAGNOSIS — M5136 Other intervertebral disc degeneration, lumbar region: Secondary | ICD-10-CM | POA: Insufficient documentation

## 2017-07-25 NOTE — Patient Instructions (Signed)
facetGENERAL RISKS AND COMPLICATIONS  What are the risk, side effects and possible complications? Generally speaking, most procedures are safe.  However, with any procedure there are risks, side effects, and the possibility of complications.  The risks and complications are dependent upon the sites that are lesioned, or the type of nerve block to be performed.  The closer the procedure is to the spine, the more serious the risks are.  Great care is taken when placing the radio frequency needles, block needles or lesioning probes, but sometimes complications can occur. 1. Infection: Any time there is an injection through the skin, there is a risk of infection.  This is why sterile conditions are used for these blocks.  There are four possible types of infection. 1. Localized skin infection. 2. Central Nervous System Infection-This can be in the form of Meningitis, which can be deadly. 3. Epidural Infections-This can be in the form of an epidural abscess, which can cause pressure inside of the spine, causing compression of the spinal cord with subsequent paralysis. This would require an emergency surgery to decompress, and there are no guarantees that the patient would recover from the paralysis. 4. Discitis-This is an infection of the intervertebral discs.  It occurs in about 1% of discography procedures.  It is difficult to treat and it may lead to surgery.        2. Pain: the needles have to go through skin and soft tissues, will cause soreness.       3. Damage to internal structures:  The nerves to be lesioned may be near blood vessels or    other nerves which can be potentially damaged.       4. Bleeding: Bleeding is more common if the patient is taking blood thinners such as  aspirin, Coumadin, Ticiid, Plavix, etc., or if he/she have some genetic predisposition  such as hemophilia. Bleeding into the spinal canal can cause compression of the spinal  cord with subsequent paralysis.  This would require  an emergency surgery to  decompress and there are no guarantees that the patient would recover from the  paralysis.       5. Pneumothorax:  Puncturing of a lung is a possibility, every time a needle is introduced in  the area of the chest or upper back.  Pneumothorax refers to free air around the  collapsed lung(s), inside of the thoracic cavity (chest cavity).  Another two possible  complications related to a similar event would include: Hemothorax and Chylothorax.   These are variations of the Pneumothorax, where instead of air around the collapsed  lung(s), you may have blood or chyle, respectively.       6. Spinal headaches: They may occur with any procedures in the area of the spine.       7. Persistent CSF (Cerebro-Spinal Fluid) leakage: This is a rare problem, but may occur  with prolonged intrathecal or epidural catheters either due to the formation of a fistulous  track or a dural tear.       8. Nerve damage: By working so close to the spinal cord, there is always a possibility of  nerve damage, which could be as serious as a permanent spinal cord injury with  paralysis.       9. Death:  Although rare, severe deadly allergic reactions known as "Anaphylactic  reaction" can occur to any of the medications used.      10. Worsening of the symptoms:  We can always make thing worse.  What are the chances of something like this happening? Chances of any of this occuring are extremely low.  By statistics, you have more of a chance of getting killed in a motor vehicle accident: while driving to the hospital than any of the above occurring .  Nevertheless, you should be aware that they are possibilities.  In general, it is similar to taking a shower.  Everybody knows that you can slip, hit your head and get killed.  Does that mean that you should not shower again?  Nevertheless always keep in mind that statistics do not mean anything if you happen to be on the wrong side of them.  Even if a procedure has a 1  (one) in a 1,000,000 (million) chance of going wrong, it you happen to be that one..Also, keep in mind that by statistics, you have more of a chance of having something go wrong when taking medications.  Who should not have this procedure? If you are on a blood thinning medication (e.g. Coumadin, Plavix, see list of "Blood Thinners"), or if you have an active infection going on, you should not have the procedure.  If you are taking any blood thinners, please inform your physician.  How should I prepare for this procedure?  Do not eat or drink anything at least six hours prior to the procedure.  Bring a driver with you .  It cannot be a taxi.  Come accompanied by an adult that can drive you back, and that is strong enough to help you if your legs get weak or numb from the local anesthetic.  Take all of your medicines the morning of the procedure with just enough water to swallow them.  If you have diabetes, make sure that you are scheduled to have your procedure done first thing in the morning, whenever possible.  If you have diabetes, take only half of your insulin dose and notify our nurse that you have done so as soon as you arrive at the clinic.  If you are diabetic, but only take blood sugar pills (oral hypoglycemic), then do not take them on the morning of your procedure.  You may take them after you have had the procedure.  Do not take aspirin or any aspirin-containing medications, at least eleven (11) days prior to the procedure.  They may prolong bleeding.  Wear loose fitting clothing that may be easy to take off and that you would not mind if it got stained with Betadine or blood.  Do not wear any jewelry or perfume  Remove any nail coloring.  It will interfere with some of our monitoring equipment.  NOTE: Remember that this is not meant to be interpreted as a complete list of all possible complications.  Unforeseen problems may occur.  BLOOD THINNERS The following drugs  contain aspirin or other products, which can cause increased bleeding during surgery and should not be taken for 2 weeks prior to and 1 week after surgery.  If you should need take something for relief of minor pain, you may take acetaminophen which is found in Tylenol,m Datril, Anacin-3 and Panadol. It is not blood thinner. The products listed below are.  Do not take any of the products listed below in addition to any listed on your instruction sheet.  A.P.C or A.P.C with Codeine Codeine Phosphate Capsules #3 Ibuprofen Ridaura  ABC compound Congesprin Imuran rimadil  Advil Cope Indocin Robaxisal  Alka-Seltzer Effervescent Pain Reliever and Antacid Coricidin or Coricidin-D  Indomethacin Rufen    Alka-Seltzer plus Cold Medicine Cosprin Ketoprofen S-A-C Tablets  Anacin Analgesic Tablets or Capsules Coumadin Korlgesic Salflex  Anacin Extra Strength Analgesic tablets or capsules CP-2 Tablets Lanoril Salicylate  Anaprox Cuprimine Capsules Levenox Salocol  Anexsia-D Dalteparin Magan Salsalate  Anodynos Darvon compound Magnesium Salicylate Sine-off  Ansaid Dasin Capsules Magsal Sodium Salicylate  Anturane Depen Capsules Marnal Soma  APF Arthritis pain formula Dewitt's Pills Measurin Stanback  Argesic Dia-Gesic Meclofenamic Sulfinpyrazone  Arthritis Bayer Timed Release Aspirin Diclofenac Meclomen Sulindac  Arthritis pain formula Anacin Dicumarol Medipren Supac  Analgesic (Safety coated) Arthralgen Diffunasal Mefanamic Suprofen  Arthritis Strength Bufferin Dihydrocodeine Mepro Compound Suprol  Arthropan liquid Dopirydamole Methcarbomol with Aspirin Synalgos  ASA tablets/Enseals Disalcid Micrainin Tagament  Ascriptin Doan's Midol Talwin  Ascriptin A/D Dolene Mobidin Tanderil  Ascriptin Extra Strength Dolobid Moblgesic Ticlid  Ascriptin with Codeine Doloprin or Doloprin with Codeine Momentum Tolectin  Asperbuf Duoprin Mono-gesic Trendar  Aspergum Duradyne Motrin or Motrin IB Triminicin  Aspirin  plain, buffered or enteric coated Durasal Myochrisine Trigesic  Aspirin Suppositories Easprin Nalfon Trillsate  Aspirin with Codeine Ecotrin Regular or Extra Strength Naprosyn Uracel  Atromid-S Efficin Naproxen Ursinus  Auranofin Capsules Elmiron Neocylate Vanquish  Axotal Emagrin Norgesic Verin  Azathioprine Empirin or Empirin with Codeine Normiflo Vitamin E  Azolid Emprazil Nuprin Voltaren  Bayer Aspirin plain, buffered or children's or timed BC Tablets or powders Encaprin Orgaran Warfarin Sodium  Buff-a-Comp Enoxaparin Orudis Zorpin  Buff-a-Comp with Codeine Equegesic Os-Cal-Gesic   Buffaprin Excedrin plain, buffered or Extra Strength Oxalid   Bufferin Arthritis Strength Feldene Oxphenbutazone   Bufferin plain or Extra Strength Feldene Capsules Oxycodone with Aspirin   Bufferin with Codeine Fenoprofen Fenoprofen Pabalate or Pabalate-SF   Buffets II Flogesic Panagesic   Buffinol plain or Extra Strength Florinal or Florinal with Codeine Panwarfarin   Buf-Tabs Flurbiprofen Penicillamine   Butalbital Compound Four-way cold tablets Penicillin   Butazolidin Fragmin Pepto-Bismol   Carbenicillin Geminisyn Percodan   Carna Arthritis Reliever Geopen Persantine   Carprofen Gold's salt Persistin   Chloramphenicol Goody's Phenylbutazone   Chloromycetin Haltrain Piroxlcam   Clmetidine heparin Plaquenil   Cllnoril Hyco-pap Ponstel   Clofibrate Hydroxy chloroquine Propoxyphen         Before stopping any of these medications, be sure to consult the physician who ordered them.  Some, such as Coumadin (Warfarin) are ordered to prevent or treat serious conditions such as "deep thrombosis", "pumonary embolisms", and other heart problems.  The amount of time that you may need off of the medication may also vary with the medication and the reason for which you were taking it.  If you are taking any of these medications, please make sure you notify your pain physician before you undergo any  procedures.         Facet Blocks Patient Information  Description: The facets are joints in the spine between the vertebrae.  Like any joints in the body, facets can become irritated and painful.  Arthritis can also effect the facets.  By injecting steroids and local anesthetic in and around these joints, we can temporarily block the nerve supply to them.  Steroids act directly on irritated nerves and tissues to reduce selling and inflammation which often leads to decreased pain.  Facet blocks may be done anywhere along the spine from the neck to the low back depending upon the location of your pain.   After numbing the skin with local anesthetic (like Novocaine), a small needle is passed onto the facet joints under x-ray guidance.    You may experience a sensation of pressure while this is being done.  The entire block usually lasts about 15-25 minutes.   Conditions which may be treated by facet blocks:   Low back/buttock pain  Neck/shoulder pain  Certain types of headaches  Preparation for the injection:  1. Do not eat any solid food or dairy products within 8 hours of your appointment. 2. You may drink clear liquid up to 3 hours before appointment.  Clear liquids include water, black coffee, juice or soda.  No milk or cream please. 3. You may take your regular medication, including pain medications, with a sip of water before your appointment.  Diabetics should hold regular insulin (if taken separately) and take 1/2 normal NPH dose the morning of the procedure.  Carry some sugar containing items with you to your appointment. 4. A driver must accompany you and be prepared to drive you home after your procedure. 5. Bring all your current medications with you. 6. An IV may be inserted and sedation may be given at the discretion of the physician. 7. A blood pressure cuff, EKG and other monitors will often be applied during the procedure.  Some patients may need to have extra oxygen  administered for a short period. 8. You will be asked to provide medical information, including your allergies and medications, prior to the procedure.  We must know immediately if you are taking blood thinners (like Coumadin/Warfarin) or if you are allergic to IV iodine contrast (dye).  We must know if you could possible be pregnant.  Possible side-effects:   Bleeding from needle site  Infection (rare, may require surgery)  Nerve injury (rare)  Numbness & tingling (temporary)  Difficulty urinating (rare, temporary)  Spinal headache (a headache worse with upright posture)  Light-headedness (temporary)  Pain at injection site (serveral days)  Decreased blood pressure (rare, temporary)  Weakness in arm/leg (temporary)  Pressure sensation in back/neck (temporary)   Call if you experience:   Fever/chills associated with headache or increased back/neck pain  Headache worsened by an upright position  New onset, weakness or numbness of an extremity below the injection site  Hives or difficulty breathing (go to the emergency room)  Inflammation or drainage at the injection site(s)  Severe back/neck pain greater than usual  New symptoms which are concerning to you  Please note:  Although the local anesthetic injected can often make your back or neck feel good for several hours after the injection, the pain will likely return. It takes 3-7 days for steroids to work.  You may not notice any pain relief for at least one week.  If effective, we will often do a series of 2-3 injections spaced 3-6 weeks apart to maximally decrease your pain.  After the initial series, you may be a candidate for a more permanent nerve block of the facets.  If you have any questions, please call [phone] [facility] 

## 2017-07-26 ENCOUNTER — Telehealth: Payer: Self-pay | Admitting: Pain Medicine

## 2017-07-26 ENCOUNTER — Telehealth: Payer: Self-pay | Admitting: Family Medicine

## 2017-07-26 NOTE — Telephone Encounter (Signed)
Patient states she is not going to have anymore procedures, does not want anymore needles near her spine. Wants to have something called in for pain even if it is just a muscle relaxer. If DR. Dossie Arbour is not going to write her any medications does she need to get her pcp to refer her to another pain clinic. Very Upset that she wasted her time yesterday

## 2017-07-26 NOTE — Telephone Encounter (Signed)
Patient notified no meds will be prescribed.

## 2017-07-30 ENCOUNTER — Ambulatory Visit (HOSPITAL_COMMUNITY): Admission: RE | Admit: 2017-07-30 | Payer: Medicaid Other | Source: Ambulatory Visit

## 2017-07-31 ENCOUNTER — Telehealth: Payer: Self-pay | Admitting: *Deleted

## 2017-08-01 NOTE — Progress Notes (Signed)
lvmail on 07-31-17 offering appt for bilateral facet block. Called 2 times trying to reach patient.

## 2017-08-08 ENCOUNTER — Other Ambulatory Visit: Payer: Self-pay

## 2017-08-08 ENCOUNTER — Emergency Department (HOSPITAL_COMMUNITY)
Admission: EM | Admit: 2017-08-08 | Discharge: 2017-08-08 | Disposition: A | Discharge status: Home / Self Care | Payer: Medicaid Other | Attending: Emergency Medicine | Admitting: Emergency Medicine

## 2017-08-08 DIAGNOSIS — F17210 Nicotine dependence, cigarettes, uncomplicated: Secondary | ICD-10-CM | POA: Diagnosis not present

## 2017-08-08 DIAGNOSIS — R1110 Vomiting, unspecified: Secondary | ICD-10-CM | POA: Diagnosis not present

## 2017-08-08 DIAGNOSIS — Z79899 Other long term (current) drug therapy: Secondary | ICD-10-CM | POA: Insufficient documentation

## 2017-08-08 DIAGNOSIS — N939 Abnormal uterine and vaginal bleeding, unspecified: Secondary | ICD-10-CM | POA: Insufficient documentation

## 2017-08-08 DIAGNOSIS — J45909 Unspecified asthma, uncomplicated: Secondary | ICD-10-CM | POA: Diagnosis not present

## 2017-08-08 DIAGNOSIS — I10 Essential (primary) hypertension: Secondary | ICD-10-CM | POA: Insufficient documentation

## 2017-08-08 DIAGNOSIS — E7800 Pure hypercholesterolemia, unspecified: Secondary | ICD-10-CM | POA: Diagnosis not present

## 2017-08-08 LAB — COMPREHENSIVE METABOLIC PANEL
ALT: 28 U/L (ref 0–44)
AST: 28 U/L (ref 15–41)
Albumin: 3.8 g/dL (ref 3.5–5.0)
Alkaline Phosphatase: 88 U/L (ref 38–126)
Anion gap: 12 (ref 5–15)
BUN: 10 mg/dL (ref 6–20)
CHLORIDE: 103 mmol/L (ref 98–111)
CO2: 24 mmol/L (ref 22–32)
CREATININE: 1.13 mg/dL — AB (ref 0.44–1.00)
Calcium: 8.9 mg/dL (ref 8.9–10.3)
GFR calc Af Amer: >60 mL/min (ref >60)
GFR calc non Af Amer: 59 mL/min — ABNORMAL LOW (ref >60)
GLUCOSE: 145 mg/dL — AB (ref 70–99)
Potassium: 3.8 mmol/L (ref 3.5–5.1)
SODIUM: 139 mmol/L (ref 135–145)
Total Bilirubin: 0.7 mg/dL (ref 0.3–1.2)
Total Protein: 7.5 g/dL (ref 6.5–8.1)

## 2017-08-08 LAB — RAPID URINE DRUG SCREEN, HOSP PERFORMED
AMPHETAMINES: NOT DETECTED
Barbiturates: NOT DETECTED
Benzodiazepines: NOT DETECTED
Cocaine: POSITIVE — AB
OPIATES: NOT DETECTED
Tetrahydrocannabinol: NOT DETECTED

## 2017-08-08 LAB — URINALYSIS, ROUTINE W REFLEX MICROSCOPIC
Bacteria, UA: NONE SEEN
Bilirubin Urine: NEGATIVE
Glucose, UA: NEGATIVE mg/dL
Hgb urine dipstick: NEGATIVE
KETONES UR: NEGATIVE mg/dL
Leukocytes, UA: NEGATIVE
Nitrite: NEGATIVE
PH: 5 (ref 5.0–8.0)
Protein, ur: 30 mg/dL — AB
SPECIFIC GRAVITY, URINE: 1.025 (ref 1.005–1.030)

## 2017-08-08 LAB — LIPASE, BLOOD: LIPASE: 43 U/L (ref 11–51)

## 2017-08-08 LAB — CBC
HCT: 39.9 % (ref 36.0–46.0)
Hemoglobin: 12.9 g/dL (ref 12.0–15.0)
MCH: 27.6 pg (ref 26.0–34.0)
MCHC: 32.3 g/dL (ref 30.0–36.0)
MCV: 85.4 fL (ref 78.0–100.0)
PLATELETS: 294 10*3/uL (ref 150–400)
RBC: 4.67 MIL/uL (ref 3.87–5.11)
RDW: 14.6 % (ref 11.5–15.5)
WBC: 8.3 10*3/uL (ref 4.0–10.5)

## 2017-08-08 LAB — I-STAT BETA HCG BLOOD, ED (MC, WL, AP ONLY): I-stat hCG, quantitative: <5 m[IU]/mL (ref <5)

## 2017-08-08 MED ORDER — METOCLOPRAMIDE HCL 10 MG PO TABS: 10.0000 mg | ORAL_TABLET | Freq: Four times a day (QID) | ORAL | 0 refills | Status: DC | PRN

## 2017-08-08 MED ORDER — HALOPERIDOL LACTATE 5 MG/ML IJ SOLN
2.0000 mg | Freq: Once | INTRAMUSCULAR | Status: AC
  Administered 2017-08-08: 2 mg via INTRAVENOUS
  Filled 2017-08-08: qty 1

## 2017-08-08 MED ORDER — ONDANSETRON HCL 4 MG/2ML IJ SOLN
4.0000 mg | Freq: Once | INTRAMUSCULAR | Status: AC | PRN
  Administered 2017-08-08: 4 mg via INTRAVENOUS
  Filled 2017-08-08: qty 2

## 2017-08-08 MED ORDER — LACTATED RINGERS IV BOLUS
1000.0000 mL | Freq: Once | INTRAVENOUS | Status: AC
  Administered 2017-08-08: 1000 mL via INTRAVENOUS

## 2017-08-08 NOTE — Progress Notes (Signed)
No response from patient, have left 3 vmails. Do you want to still try to schedule for appt.

## 2017-08-08 NOTE — ED Notes (Signed)
ED Provider at bedside. 
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
## 2017-08-08 NOTE — ED Notes (Signed)
Pt tolerating saltine crackers and apple juice

## 2017-08-08 NOTE — ED Triage Notes (Addendum)
Pt to ED for vaginal bleeding x3 days. Diagnosed with uterine fibroids last week. N/V and pelvic pain today, has gone through 48 tampons in last 24 hours.  Pt hypotensive on EMS arrival, 82/68 with no radial pulses and became dizzy on standing. 500 bolus given to improve blood pressure to 112/84. Pt took 4 mg zofran before EMS arrival

## 2017-08-10 ENCOUNTER — Ambulatory Visit (HOSPITAL_COMMUNITY): Admission: RE | Admit: 2017-08-10 | Payer: Medicaid Other | Source: Ambulatory Visit

## 2017-08-15 ENCOUNTER — Other Ambulatory Visit: Payer: Self-pay | Admitting: Family Medicine

## 2017-08-15 ENCOUNTER — Ambulatory Visit (HOSPITAL_COMMUNITY)
Admission: RE | Admit: 2017-08-15 | Discharge: 2017-08-15 | Disposition: A | Discharge status: Home / Self Care | Payer: Medicaid Other | Source: Ambulatory Visit | Attending: Obstetrics & Gynecology | Admitting: Obstetrics & Gynecology

## 2017-08-15 DIAGNOSIS — D259 Leiomyoma of uterus, unspecified: Secondary | ICD-10-CM | POA: Diagnosis not present

## 2017-08-15 DIAGNOSIS — D219 Benign neoplasm of connective and other soft tissue, unspecified: Secondary | ICD-10-CM

## 2017-08-15 NOTE — Telephone Encounter (Signed)
Pt called and said she has been to the ER again since her last office visit for her stomach issues. She said she was given Reglan 10 MG for the nausea and it has worked really well but she is out of it and the nausea is back. Pt would like to know if there is any way she can get a refill. Please advise

## 2017-08-17 MED ORDER — METOCLOPRAMIDE HCL 10 MG PO TABS: 10.0000 mg | ORAL_TABLET | Freq: Three times a day (TID) | ORAL | 1 refills | Status: DC | PRN

## 2017-08-17 NOTE — Telephone Encounter (Signed)
lmovm informing pt that the "rx you requested was sent to your pharmacy". Brison Fiumara, Salome Spotted, CMA

## 2017-08-17 NOTE — Telephone Encounter (Signed)
Dear [place] Team Please let her know I sent it in THANKS! Brenyn Petrey  

## 2017-08-20 ENCOUNTER — Encounter: Payer: Self-pay | Admitting: Obstetrics & Gynecology

## 2017-08-20 ENCOUNTER — Ambulatory Visit (INDEPENDENT_AMBULATORY_CARE_PROVIDER_SITE_OTHER): Payer: Medicaid Other | Admitting: Obstetrics & Gynecology

## 2017-08-20 VITALS — BP 118/80 | HR 94 | Ht 65.0 in | Wt 260.5 lb

## 2017-08-20 DIAGNOSIS — N938 Other specified abnormal uterine and vaginal bleeding: Secondary | ICD-10-CM

## 2017-08-20 DIAGNOSIS — D219 Benign neoplasm of connective and other soft tissue, unspecified: Secondary | ICD-10-CM | POA: Diagnosis not present

## 2017-08-20 MED ORDER — MEGESTROL ACETATE 40 MG PO TABS: ORAL_TABLET | ORAL | 5 refills | Status: DC

## 2017-08-20 NOTE — Progress Notes (Signed)
Patient had elevated gad7. Patient states she has a provider at [facility] she sees once a month & has an appt on Thursday there.

## 2017-08-20 NOTE — Progress Notes (Signed)
   Subjective:    Patient ID: Holly Mendez, female    DOB: 10-Apr-1975, 42 y.o.   MRN: [id number]  HPI 42 yo separated P3 here today for follow up of u/s results. She had a CT done about 2 months ago during an evaluation of pancreatitis and a possible fibroid was seen. A u/s I ordered confirmed this ( 1 3.8 cm submucosal fibroid and a 1.4 cm fibroid). She has had a BTL and abation. She generally has a period monthly but since she saw me last month she has basically bled every day, some heavy, some light days.   Review of Systems Pap and mammogram normal    Objective:   Physical Exam Breathing, conversing, and ambulating normally Well nourished, well hydrated Black female, no apparent distress Pleasant Abd- obese, benign     Assessment & Plan:  DUB with fibroids- offered hysterectomy versus medical management. She will try megace 40 mg BID for 10 days of each month for 3 months and then prn Come prn

## 2017-08-21 LAB — CBC
Hematocrit: 37 % (ref 34.0–46.6)
Hemoglobin: 12.1 g/dL (ref 11.1–15.9)
MCH: 27.5 pg (ref 26.6–33.0)
MCHC: 32.7 g/dL (ref 31.5–35.7)
MCV: 84 fL (ref 79–97)
PLATELETS: 318 10*3/uL (ref 150–450)
RBC: 4.4 x10E6/uL (ref 3.77–5.28)
RDW: 16 % — AB (ref 12.3–15.4)
WBC: 7.9 10*3/uL (ref 3.4–10.8)

## 2017-08-21 LAB — TSH: TSH: 0.795 u[IU]/mL (ref 0.450–4.500)

## 2017-08-31 ENCOUNTER — Emergency Department (HOSPITAL_COMMUNITY): Payer: Medicaid Other

## 2017-08-31 ENCOUNTER — Other Ambulatory Visit: Payer: Self-pay

## 2017-08-31 ENCOUNTER — Emergency Department (HOSPITAL_COMMUNITY)
Admission: EM | Admit: 2017-08-31 | Discharge: 2017-08-31 | Disposition: A | Discharge status: Home / Self Care | Payer: Medicaid Other | Attending: Emergency Medicine | Admitting: Emergency Medicine

## 2017-08-31 ENCOUNTER — Encounter (HOSPITAL_COMMUNITY): Payer: Self-pay | Admitting: Emergency Medicine

## 2017-08-31 DIAGNOSIS — Z87891 Personal history of nicotine dependence: Secondary | ICD-10-CM | POA: Insufficient documentation

## 2017-08-31 DIAGNOSIS — I10 Essential (primary) hypertension: Secondary | ICD-10-CM | POA: Diagnosis not present

## 2017-08-31 DIAGNOSIS — Z79899 Other long term (current) drug therapy: Secondary | ICD-10-CM | POA: Diagnosis not present

## 2017-08-31 DIAGNOSIS — R112 Nausea with vomiting, unspecified: Secondary | ICD-10-CM | POA: Insufficient documentation

## 2017-08-31 DIAGNOSIS — R1084 Generalized abdominal pain: Secondary | ICD-10-CM | POA: Diagnosis not present

## 2017-08-31 DIAGNOSIS — J45909 Unspecified asthma, uncomplicated: Secondary | ICD-10-CM | POA: Insufficient documentation

## 2017-08-31 LAB — COMPREHENSIVE METABOLIC PANEL
ALT: 31 U/L (ref 0–44)
AST: 24 U/L (ref 15–41)
Albumin: 4.1 g/dL (ref 3.5–5.0)
Alkaline Phosphatase: 77 U/L (ref 38–126)
Anion gap: 12 (ref 5–15)
BUN: 6 mg/dL (ref 6–20)
CO2: 24 mmol/L (ref 22–32)
Calcium: 9.7 mg/dL (ref 8.9–10.3)
Chloride: 107 mmol/L (ref 98–111)
Creatinine, Ser: 0.85 mg/dL (ref 0.44–1.00)
GFR calc Af Amer: >60 mL/min (ref >60)
GFR calc non Af Amer: >60 mL/min (ref >60)
Glucose, Bld: 170 mg/dL — ABNORMAL HIGH (ref 70–99)
Potassium: 3.9 mmol/L (ref 3.5–5.1)
Sodium: 143 mmol/L (ref 135–145)
Total Bilirubin: 0.6 mg/dL (ref 0.3–1.2)
Total Protein: 8 g/dL (ref 6.5–8.1)

## 2017-08-31 LAB — CBC
HCT: 39 % (ref 36.0–46.0)
Hemoglobin: 13.2 g/dL (ref 12.0–15.0)
MCH: 28.3 pg (ref 26.0–34.0)
MCHC: 33.8 g/dL (ref 30.0–36.0)
MCV: 83.7 fL (ref 78.0–100.0)
Platelets: 333 10*3/uL (ref 150–400)
RBC: 4.66 MIL/uL (ref 3.87–5.11)
RDW: 15.5 % (ref 11.5–15.5)
WBC: 8 10*3/uL (ref 4.0–10.5)

## 2017-08-31 LAB — LIPASE, BLOOD: Lipase: 42 U/L (ref 11–51)

## 2017-08-31 LAB — I-STAT BETA HCG BLOOD, ED (MC, WL, AP ONLY): I-stat hCG, quantitative: <5 m[IU]/mL (ref <5)

## 2017-08-31 MED ORDER — ONDANSETRON HCL 4 MG/2ML IJ SOLN
4.0000 mg | Freq: Once | INTRAMUSCULAR | Status: AC
  Administered 2017-08-31: 4 mg via INTRAVENOUS
  Filled 2017-08-31: qty 2

## 2017-08-31 MED ORDER — FENTANYL CITRATE (PF) 100 MCG/2ML IJ SOLN
50.0000 ug | Freq: Once | INTRAMUSCULAR | Status: AC
Start: 2017-08-31 — End: 2017-08-31
  Administered 2017-08-31: 50 ug via INTRAVENOUS
  Filled 2017-08-31: qty 2

## 2017-08-31 MED ORDER — SODIUM CHLORIDE 0.9 % IV BOLUS
1000.0000 mL | Freq: Once | INTRAVENOUS | Status: AC
  Administered 2017-08-31: 1000 mL via INTRAVENOUS

## 2017-08-31 MED ORDER — METOCLOPRAMIDE HCL 5 MG/ML IJ SOLN
10.0000 mg | Freq: Once | INTRAMUSCULAR | Status: AC
  Administered 2017-08-31: 10 mg via INTRAVENOUS
  Filled 2017-08-31: qty 2

## 2017-08-31 MED ORDER — HALOPERIDOL LACTATE 5 MG/ML IJ SOLN
2.0000 mg | Freq: Once | INTRAMUSCULAR | Status: AC
  Administered 2017-08-31: 2 mg via INTRAVENOUS
  Filled 2017-08-31: qty 1

## 2017-08-31 MED ORDER — IOPAMIDOL (ISOVUE-300) INJECTION 61%
100.0000 mL | Freq: Once | INTRAVENOUS | Status: AC | PRN
  Administered 2017-08-31: 100 mL via INTRAVENOUS

## 2017-08-31 MED ORDER — ONDANSETRON 4 MG PO TBDP: ORAL_TABLET | ORAL | 3 refills | Status: DC

## 2017-08-31 MED ORDER — IOPAMIDOL (ISOVUE-300) INJECTION 61%
INTRAVENOUS | Status: AC
  Filled 2017-08-31: qty 100

## 2017-08-31 MED ORDER — METOCLOPRAMIDE HCL 10 MG PO TABS: 10.0000 mg | ORAL_TABLET | Freq: Four times a day (QID) | ORAL | 0 refills | Status: DC | PRN

## 2017-08-31 NOTE — ED Notes (Signed)
Bed: [id number] Expected date:  Expected time:  Means of arrival:  Comments: EMS-N/V

## 2017-08-31 NOTE — ED Triage Notes (Signed)
Pt c/o upper and lower abdominal pain with nausea and vomiting x 3 days. Pt given 531ml NS and Zofran 4 mg IV by EMS.
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
## 2017-09-11 ENCOUNTER — Other Ambulatory Visit: Payer: Self-pay | Admitting: Family Medicine

## 2017-09-18 ENCOUNTER — Telehealth: Payer: Self-pay | Admitting: Family Medicine

## 2017-09-18 NOTE — Telephone Encounter (Signed)
Pt is calling and would like a referral to go to GI since she is not getting any better, she is starting to get concerned. jw

## 2017-09-19 NOTE — Telephone Encounter (Signed)
Dear [name] Team Please have her make appt to see me.    Dear [name] Team FYI: (you do not need to tell her this but I am documenting so you know why I am not just doing a referral: She no showed the GI referral I sent for her to go to [facility], Dr. Benson Norway evidently is not willing tosee her back and neither is [facility]---so I will have to explain to her that a referral is likely not possible or not easy at the very least. Holly Mendez

## 2017-09-25 ENCOUNTER — Other Ambulatory Visit: Payer: Self-pay | Admitting: Family Medicine

## 2017-09-26 ENCOUNTER — Other Ambulatory Visit: Payer: Self-pay

## 2017-09-26 ENCOUNTER — Ambulatory Visit (INDEPENDENT_AMBULATORY_CARE_PROVIDER_SITE_OTHER): Payer: Medicaid Other | Admitting: Family Medicine

## 2017-09-26 ENCOUNTER — Encounter: Payer: Self-pay | Admitting: Family Medicine

## 2017-09-26 VITALS — BP 112/82 | HR 95 | Temp 98.3°F | Ht 65.0 in | Wt 257.3 lb

## 2017-09-26 DIAGNOSIS — Z23 Encounter for immunization: Secondary | ICD-10-CM | POA: Diagnosis not present

## 2017-09-26 DIAGNOSIS — R1013 Epigastric pain: Secondary | ICD-10-CM

## 2017-09-26 DIAGNOSIS — R739 Hyperglycemia, unspecified: Secondary | ICD-10-CM | POA: Diagnosis present

## 2017-09-26 DIAGNOSIS — G8929 Other chronic pain: Secondary | ICD-10-CM | POA: Diagnosis not present

## 2017-09-26 NOTE — Patient Instructions (Signed)
Great to see you. Keep up with the regular fiber! Let me see you back in 6-8 weeks.

## 2017-09-27 LAB — HEMOGLOBIN A1C
Est. average glucose Bld gHb Est-mCnc: 140 mg/dL
HEMOGLOBIN A1C: 6.5 % — AB (ref 4.8–5.6)

## 2017-09-27 LAB — LDL CHOLESTEROL, DIRECT: LDL Direct: 158 mg/dL — ABNORMAL HIGH (ref 0–99)

## 2017-09-27 NOTE — Assessment & Plan Note (Signed)
I reviewed again with her the work-up to date which is essentially been negative.  Gastroenterology thought most of hers were related to mild to moderate gastroparesis.  She also smokes marijuana and may be a component of hyperemesis/nausea related to substance use.  She has been intermittent with her fiber intake so we discussed that.  We spent greater than 50% of our 25-minute office visit in counseling education regarding these issues.  I think she would do well with regular follow-ups I will see her back in 4 to 6 weeks.

## 2017-09-27 NOTE — Assessment & Plan Note (Signed)
Reviewing her recent emergency department visit, she was noted to be hyperglycemic.  She has had a few episodes in the past.  We will check some blood work today and follow-up as appropriate.

## 2017-09-27 NOTE — Progress Notes (Signed)
    CHIEF COMPLAINT / HPI: Follow-up recent emergency department visit for abdominal pain, nausea, vomiting.  They had given her some medicine which helped the nausea.  She does not have any more of that at home.  She has questions about why this continues to happen.  She wonders if she needs to see another gastroenterologist for further evaluation. Since return to the emergency department to home, she has not had any emesis.  She has a fairly low level nausea all the time 1-2.  On the day of admission to the emergency department her nausea had increased to 6-7 level.  She did not have any change in her bowel movements specifically no blood in her stool.  Appetite has been stable.  REVIEW OF SYSTEMS: No unusual weight change.  No fever.  See HPI.  PERTINENT  PMH / PSH: I have reviewed the patient's medications, allergies, past medical and surgical history, smoking status and updated in the EMR as appropriate.   OBJECTIVE: GENERAL: Well-developed overweight female no acute distress neck: No JVD, no thyromegaly, no lymphadenopathy CV: Regular rate and rhythm no murmur LUNGS: Clear to auscultation bilaterally ABDOMEN: Soft, positive bowel sounds nontender nondistended no rebound or guarding.  No masses are noted although this exam is limited by habitus. EXTREMITY: No edema noted. PSYCHIATRIC: Alert and oriented x4.  Affect is interactive.  Speech is normal in fluency and content.  No psychomotor retardation, no agitation.  ASSESSMENT / PLAN:  Abdominal pain, chronic, epigastric I reviewed again with her the work-up to date which is essentially been negative.  Gastroenterology thought most of hers were related to mild to moderate gastroparesis.  She also smokes marijuana and may be a component of hyperemesis/nausea related to substance use.  She has been intermittent with her fiber intake so we discussed that.  We spent greater than 50% of our 25-minute office visit in counseling education  regarding these issues.  I think she would do well with regular follow-ups I will see her back in 4 to 6 weeks.  Hyperglycemia Reviewing her recent emergency department visit, she was noted to be hyperglycemic.  She has had a few episodes in the past.  We will check some blood work today and follow-up as appropriate.

## 2017-10-04 ENCOUNTER — Other Ambulatory Visit: Payer: Self-pay | Admitting: Family Medicine

## 2017-10-10 ENCOUNTER — Encounter (HOSPITAL_COMMUNITY): Payer: Self-pay | Admitting: Emergency Medicine

## 2017-10-10 ENCOUNTER — Emergency Department (HOSPITAL_COMMUNITY): Payer: Medicaid Other

## 2017-10-10 ENCOUNTER — Emergency Department (HOSPITAL_COMMUNITY)
Admission: EM | Admit: 2017-10-10 | Discharge: 2017-10-10 | Disposition: A | Discharge status: Home / Self Care | Payer: Medicaid Other | Attending: Emergency Medicine | Admitting: Emergency Medicine

## 2017-10-10 ENCOUNTER — Other Ambulatory Visit: Payer: Self-pay

## 2017-10-10 DIAGNOSIS — R1110 Vomiting, unspecified: Secondary | ICD-10-CM | POA: Insufficient documentation

## 2017-10-10 DIAGNOSIS — R1084 Generalized abdominal pain: Secondary | ICD-10-CM | POA: Diagnosis not present

## 2017-10-10 DIAGNOSIS — I10 Essential (primary) hypertension: Secondary | ICD-10-CM | POA: Insufficient documentation

## 2017-10-10 DIAGNOSIS — J45909 Unspecified asthma, uncomplicated: Secondary | ICD-10-CM | POA: Insufficient documentation

## 2017-10-10 DIAGNOSIS — Z87891 Personal history of nicotine dependence: Secondary | ICD-10-CM | POA: Insufficient documentation

## 2017-10-10 DIAGNOSIS — G8929 Other chronic pain: Secondary | ICD-10-CM | POA: Insufficient documentation

## 2017-10-10 DIAGNOSIS — Z79899 Other long term (current) drug therapy: Secondary | ICD-10-CM | POA: Diagnosis not present

## 2017-10-10 DIAGNOSIS — R109 Unspecified abdominal pain: Secondary | ICD-10-CM

## 2017-10-10 LAB — CBC
HEMATOCRIT: 41.2 % (ref 36.0–46.0)
Hemoglobin: 13.7 g/dL (ref 12.0–15.0)
MCH: 28.2 pg (ref 26.0–34.0)
MCHC: 33.3 g/dL (ref 30.0–36.0)
MCV: 84.8 fL (ref 78.0–100.0)
Platelets: 347 10*3/uL (ref 150–400)
RBC: 4.86 MIL/uL (ref 3.87–5.11)
RDW: 13.9 % (ref 11.5–15.5)
WBC: 9.7 10*3/uL (ref 4.0–10.5)

## 2017-10-10 LAB — COMPREHENSIVE METABOLIC PANEL
ALBUMIN: 4.2 g/dL (ref 3.5–5.0)
ALK PHOS: 82 U/L (ref 38–126)
ALT: 24 U/L (ref 0–44)
AST: 28 U/L (ref 15–41)
Anion gap: 16 — ABNORMAL HIGH (ref 5–15)
BILIRUBIN TOTAL: 0.6 mg/dL (ref 0.3–1.2)
BUN: 10 mg/dL (ref 6–20)
CALCIUM: 10 mg/dL (ref 8.9–10.3)
CO2: 19 mmol/L — AB (ref 22–32)
Chloride: 104 mmol/L (ref 98–111)
Creatinine, Ser: 1.36 mg/dL — ABNORMAL HIGH (ref 0.44–1.00)
GFR calc Af Amer: 54 mL/min — ABNORMAL LOW (ref >60)
GFR calc non Af Amer: 47 mL/min — ABNORMAL LOW (ref >60)
GLUCOSE: 143 mg/dL — AB (ref 70–99)
POTASSIUM: 3.5 mmol/L (ref 3.5–5.1)
Sodium: 139 mmol/L (ref 135–145)
TOTAL PROTEIN: 8.3 g/dL — AB (ref 6.5–8.1)

## 2017-10-10 LAB — I-STAT CG4 LACTIC ACID, ED
Lactic Acid, Venous: 2.57 mmol/L (ref 0.5–1.9)
Lactic Acid, Venous: 1.43 mmol/L (ref 0.5–1.9)

## 2017-10-10 LAB — URINALYSIS, ROUTINE W REFLEX MICROSCOPIC
BILIRUBIN URINE: NEGATIVE
GLUCOSE, UA: NEGATIVE mg/dL
KETONES UR: 80 mg/dL — AB
Leukocytes, UA: NEGATIVE
NITRITE: NEGATIVE
PH: 6 (ref 5.0–8.0)
PROTEIN: 100 mg/dL — AB
Specific Gravity, Urine: 1.03 (ref 1.005–1.030)

## 2017-10-10 LAB — I-STAT BETA HCG BLOOD, ED (MC, WL, AP ONLY): I-stat hCG, quantitative: <5 m[IU]/mL (ref <5)

## 2017-10-10 LAB — LIPASE, BLOOD: Lipase: 30 U/L (ref 11–51)

## 2017-10-10 MED ORDER — ONDANSETRON HCL 4 MG/2ML IJ SOLN
4.0000 mg | Freq: Once | INTRAMUSCULAR | Status: AC
  Administered 2017-10-10: 4 mg via INTRAVENOUS
  Filled 2017-10-10: qty 2

## 2017-10-10 MED ORDER — LACTATED RINGERS IV BOLUS
1000.0000 mL | Freq: Once | INTRAVENOUS | Status: AC
  Administered 2017-10-10: 1000 mL via INTRAVENOUS

## 2017-10-10 MED ORDER — GI COCKTAIL ~~LOC~~
30.0000 mL | Freq: Once | ORAL | Status: AC
  Administered 2017-10-10: 30 mL via ORAL
  Filled 2017-10-10: qty 30

## 2017-10-10 MED ORDER — ONDANSETRON 4 MG PO TBDP: 4.0000 mg | ORAL_TABLET | Freq: Three times a day (TID) | ORAL | 0 refills | Status: DC | PRN

## 2017-10-10 MED ORDER — METOCLOPRAMIDE HCL 5 MG/ML IJ SOLN
10.0000 mg | Freq: Once | INTRAMUSCULAR | Status: AC
  Administered 2017-10-10: 10 mg via INTRAVENOUS
  Filled 2017-10-10: qty 2

## 2017-10-10 MED ORDER — HYDROMORPHONE HCL 1 MG/ML IJ SOLN
1.0000 mg | Freq: Once | INTRAMUSCULAR | Status: AC
  Administered 2017-10-10: 1 mg via INTRAVENOUS
  Filled 2017-10-10: qty 1

## 2017-10-10 NOTE — ED Notes (Signed)
Patient transported to X-ray 

## 2017-10-10 NOTE — ED Notes (Signed)
Patient verbalizes understanding of discharge instructions. Opportunity for questioning and answers were provided. Armband removed by staff, pt discharged from ED ambulatory.   

## 2017-10-10 NOTE — Discharge Instructions (Signed)
If you develop fever, recurrent vomiting or inability to tolerate oral fluids, severe abdominal pain, or any other new/concerning symptoms and return to the ER for evaluation.
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
## 2017-10-11 ENCOUNTER — Encounter (HOSPITAL_COMMUNITY): Payer: Self-pay

## 2017-10-11 ENCOUNTER — Emergency Department (HOSPITAL_COMMUNITY): Payer: Medicaid Other

## 2017-10-11 ENCOUNTER — Emergency Department (HOSPITAL_COMMUNITY)
Admission: EM | Admit: 2017-10-11 | Discharge: 2017-10-11 | Disposition: A | Discharge status: Home / Self Care | Payer: Medicaid Other | Attending: Emergency Medicine | Admitting: Emergency Medicine

## 2017-10-11 ENCOUNTER — Other Ambulatory Visit: Payer: Self-pay

## 2017-10-11 DIAGNOSIS — Z87891 Personal history of nicotine dependence: Secondary | ICD-10-CM | POA: Diagnosis not present

## 2017-10-11 DIAGNOSIS — R112 Nausea with vomiting, unspecified: Secondary | ICD-10-CM | POA: Diagnosis not present

## 2017-10-11 DIAGNOSIS — Z79899 Other long term (current) drug therapy: Secondary | ICD-10-CM | POA: Insufficient documentation

## 2017-10-11 DIAGNOSIS — I10 Essential (primary) hypertension: Secondary | ICD-10-CM | POA: Insufficient documentation

## 2017-10-11 DIAGNOSIS — E7800 Pure hypercholesterolemia, unspecified: Secondary | ICD-10-CM | POA: Insufficient documentation

## 2017-10-11 DIAGNOSIS — J45909 Unspecified asthma, uncomplicated: Secondary | ICD-10-CM | POA: Insufficient documentation

## 2017-10-11 DIAGNOSIS — R1013 Epigastric pain: Secondary | ICD-10-CM | POA: Diagnosis present

## 2017-10-11 DIAGNOSIS — K861 Other chronic pancreatitis: Secondary | ICD-10-CM | POA: Diagnosis not present

## 2017-10-11 LAB — URINALYSIS, ROUTINE W REFLEX MICROSCOPIC
Bilirubin Urine: NEGATIVE
Glucose, UA: NEGATIVE mg/dL
KETONES UR: 20 mg/dL — AB
Leukocytes, UA: NEGATIVE
Nitrite: NEGATIVE
PROTEIN: NEGATIVE mg/dL
Specific Gravity, Urine: 1.036 — ABNORMAL HIGH (ref 1.005–1.030)
pH: 7 (ref 5.0–8.0)

## 2017-10-11 LAB — CBC WITH DIFFERENTIAL/PLATELET
ABS IMMATURE GRANULOCYTES: 0.1 10*3/uL (ref 0.0–0.1)
Basophils Absolute: 0 10*3/uL (ref 0.0–0.1)
Basophils Relative: 0 %
EOS ABS: 0 10*3/uL (ref 0.0–0.7)
Eosinophils Relative: 0 %
HEMATOCRIT: 41.8 % (ref 36.0–46.0)
HEMOGLOBIN: 14 g/dL (ref 12.0–15.0)
IMMATURE GRANULOCYTES: 1 %
LYMPHS ABS: 2.4 10*3/uL (ref 0.7–4.0)
LYMPHS PCT: 25 %
MCH: 28.2 pg (ref 26.0–34.0)
MCHC: 33.5 g/dL (ref 30.0–36.0)
MCV: 84.3 fL (ref 78.0–100.0)
MONOS PCT: 5 %
Monocytes Absolute: 0.5 10*3/uL (ref 0.1–1.0)
NEUTROS PCT: 69 %
Neutro Abs: 6.6 10*3/uL (ref 1.7–7.7)
Platelets: 330 10*3/uL (ref 150–400)
RBC: 4.96 MIL/uL (ref 3.87–5.11)
RDW: 14.2 % (ref 11.5–15.5)
WBC: 9.6 10*3/uL (ref 4.0–10.5)

## 2017-10-11 LAB — TROPONIN I

## 2017-10-11 LAB — COMPREHENSIVE METABOLIC PANEL
ALBUMIN: 4.2 g/dL (ref 3.5–5.0)
ALK PHOS: 87 U/L (ref 38–126)
ALT: 43 U/L (ref 0–44)
ANION GAP: 17 — AB (ref 5–15)
AST: 46 U/L — ABNORMAL HIGH (ref 15–41)
BILIRUBIN TOTAL: 0.8 mg/dL (ref 0.3–1.2)
BUN: 12 mg/dL (ref 6–20)
CO2: 20 mmol/L — ABNORMAL LOW (ref 22–32)
CREATININE: 1.22 mg/dL — AB (ref 0.44–1.00)
Calcium: 9.9 mg/dL (ref 8.9–10.3)
Chloride: 102 mmol/L (ref 98–111)
GFR calc non Af Amer: 53 mL/min — ABNORMAL LOW (ref >60)
Glucose, Bld: 149 mg/dL — ABNORMAL HIGH (ref 70–99)
POTASSIUM: 3.4 mmol/L — AB (ref 3.5–5.1)
SODIUM: 139 mmol/L (ref 135–145)
Total Protein: 8.1 g/dL (ref 6.5–8.1)

## 2017-10-11 LAB — I-STAT BETA HCG BLOOD, ED (MC, WL, AP ONLY)

## 2017-10-11 LAB — LIPASE, BLOOD: LIPASE: 35 U/L (ref 11–51)

## 2017-10-11 MED ORDER — HYDROMORPHONE HCL 1 MG/ML IJ SOLN
1.0000 mg | Freq: Once | INTRAMUSCULAR | Status: AC
  Administered 2017-10-11: 1 mg via INTRAVENOUS
  Filled 2017-10-11: qty 1

## 2017-10-11 MED ORDER — PROMETHAZINE HCL 25 MG/ML IJ SOLN
25.0000 mg | Freq: Once | INTRAMUSCULAR | Status: AC
  Administered 2017-10-11: 25 mg via INTRAVENOUS
  Filled 2017-10-11: qty 1

## 2017-10-11 MED ORDER — IOHEXOL 300 MG/ML  SOLN
100.0000 mL | Freq: Once | INTRAMUSCULAR | Status: AC | PRN
  Administered 2017-10-11: 100 mL via INTRAVENOUS

## 2017-10-11 MED ORDER — PROMETHAZINE HCL 25 MG RE SUPP: 25.0000 mg | Freq: Four times a day (QID) | RECTAL | 0 refills | Status: DC | PRN

## 2017-10-11 MED ORDER — HYDROCODONE-ACETAMINOPHEN 5-325 MG PO TABS: 1.0000 | ORAL_TABLET | ORAL | 0 refills | Status: DC | PRN

## 2017-10-11 MED ORDER — ONDANSETRON HCL 4 MG/2ML IJ SOLN
4.0000 mg | Freq: Once | INTRAMUSCULAR | Status: AC
  Administered 2017-10-11: 4 mg via INTRAVENOUS
  Filled 2017-10-11: qty 2

## 2017-10-11 MED ORDER — MORPHINE SULFATE (PF) 4 MG/ML IV SOLN
4.0000 mg | Freq: Once | INTRAVENOUS | Status: AC
  Administered 2017-10-11: 4 mg via INTRAVENOUS
  Filled 2017-10-11: qty 1

## 2017-10-11 NOTE — ED Triage Notes (Signed)
Pt brought in by EMS due to having ABD pain. Pt has hx of pancreatitis. Pt endorses n/v.
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
## 2017-10-12 ENCOUNTER — Other Ambulatory Visit: Payer: Self-pay | Admitting: Family Medicine

## 2017-10-19 ENCOUNTER — Telehealth: Payer: Self-pay | Admitting: *Deleted

## 2017-10-19 NOTE — Telephone Encounter (Signed)
Response sounds appropriate.  Nothing further to add.

## 2017-10-19 NOTE — Telephone Encounter (Signed)
Pt called [facility] stating to telephone receptionist that she is very sick and is going to kill herself, she was ask if she is suicidal and she again stated yes, she was going to kill herself. A female that identified himself as her son takes the phone and states he knows she is suicidal, he was ask if he could bring her to ED for eval and he stated no. [facility] triage called 911 and ask for EMS to respond and possibly welfare check by [employer], they are on the way. I have attempted to reach family practice and have not been successful, it is now 12 and have just been able to reach someone in [facility] lab who found Holly Mendez, gave her an update and she will f/u

## 2017-10-19 NOTE — Telephone Encounter (Signed)
Noted. Spoke with Bonnita Nasuti, triage nurse at [facility]. Agree with steps taken. Will route note also to our preceptor for any further instruction.  Danley Danker, RN [facility] RN)

## 2017-10-24 ENCOUNTER — Telehealth: Payer: Self-pay | Admitting: Family Medicine

## 2017-10-24 NOTE — Telephone Encounter (Signed)
Pt has appointment on 11/07/2017 for a hospital follow up, routing to PCP. Katharina Caper, Lakeia Bradshaw D, [place]

## 2017-10-24 NOTE — Telephone Encounter (Signed)
Dear [name] Team Let her know I can best discuss these issues at her appointment. Thanks! [name]

## 2017-10-24 NOTE — Telephone Encounter (Signed)
Holly Mendez is calling to let Dr. Nori Riis know that she has been in the emergency room 3 times this week. She would like for Dr. Nori Riis to discuss high blood pressure and high sugars. The best contact number is [phone].

## 2017-10-24 NOTE — Telephone Encounter (Signed)
Pt informed of below. Zimmerman Rumple, Kadarious Dikes D, CMA  

## 2017-10-30 ENCOUNTER — Telehealth: Payer: Self-pay | Admitting: Family Medicine

## 2017-10-30 ENCOUNTER — Other Ambulatory Visit: Payer: Self-pay | Admitting: Family Medicine

## 2017-10-30 MED ORDER — METRONIDAZOLE 500 MG PO TABS: ORAL_TABLET | ORAL | 0 refills | Status: DC

## 2017-10-30 NOTE — Telephone Encounter (Signed)
Pt calling back about this phone message. She asked that she get a call back as soon as possible.

## 2017-10-30 NOTE — Telephone Encounter (Signed)
Dear [name] Team I will call in metronidazole for her. It will be 4 tabs all at once--as a single dose THANKS! [name]

## 2017-10-30 NOTE — Telephone Encounter (Signed)
PT informed of below. Zimmerman Rumple, Marico Buckle D, CMA  

## 2017-10-30 NOTE — Telephone Encounter (Signed)
Pts partner was tested and treated for trichomonas.  Will forward to MD. Muaaz Brau, Salome Spotted, [place]

## 2017-10-30 NOTE — Telephone Encounter (Signed)
Pt is calling and would like to know if Dr. Nori Riis could send in a antibiotic for her due to her significant other being seen at the std clinic and testing positive for an std. I told her she would most likely need to make an appointment but she says that she has just called to have an antibiotic filled before. She would like for someone to call her at [phone].

## 2017-10-31 ENCOUNTER — Ambulatory Visit: Payer: Medicaid Other

## 2017-11-06 NOTE — Telephone Encounter (Signed)
Spoke with pt reminding them of their appt for tomorrow 11/07/17. -[place]

## 2017-11-07 ENCOUNTER — Ambulatory Visit (INDEPENDENT_AMBULATORY_CARE_PROVIDER_SITE_OTHER): Payer: Medicaid Other | Admitting: Family Medicine

## 2017-11-07 ENCOUNTER — Encounter: Payer: Self-pay | Admitting: Family Medicine

## 2017-11-07 ENCOUNTER — Other Ambulatory Visit: Payer: Self-pay

## 2017-11-07 ENCOUNTER — Telehealth: Payer: Self-pay | Admitting: *Deleted

## 2017-11-07 DIAGNOSIS — I10 Essential (primary) hypertension: Secondary | ICD-10-CM | POA: Diagnosis not present

## 2017-11-07 DIAGNOSIS — F3170 Bipolar disorder, currently in remission, most recent episode unspecified: Secondary | ICD-10-CM | POA: Diagnosis not present

## 2017-11-07 DIAGNOSIS — B9689 Other specified bacterial agents as the cause of diseases classified elsewhere: Secondary | ICD-10-CM

## 2017-11-07 DIAGNOSIS — G8929 Other chronic pain: Secondary | ICD-10-CM | POA: Diagnosis not present

## 2017-11-07 DIAGNOSIS — N760 Acute vaginitis: Secondary | ICD-10-CM | POA: Diagnosis not present

## 2017-11-07 DIAGNOSIS — R1013 Epigastric pain: Secondary | ICD-10-CM

## 2017-11-07 MED ORDER — CLONIDINE 0.1 MG/24HR TD PTWK: 0.1000 mg | MEDICATED_PATCH | TRANSDERMAL | 12 refills | Status: DC

## 2017-11-07 NOTE — Telephone Encounter (Signed)
Received fax from [place] requesting prior authorization of Clonidine patches .  Form placed in MD's box for completion along with Medicaid formulary.  Juwaun Inskeep, Salome Spotted, CMA

## 2017-11-08 ENCOUNTER — Encounter: Payer: Self-pay | Admitting: Family Medicine

## 2017-11-08 NOTE — Assessment & Plan Note (Signed)
Again discussed her chronic abdominal pain.  Recommended regular use of fiber supplement.

## 2017-11-08 NOTE — Assessment & Plan Note (Signed)
Start clonidine as per comments under bipolar disorder diagnosis in this note

## 2017-11-08 NOTE — Assessment & Plan Note (Signed)
Reviewed clonidine patch with her.  I am fine with starting that at low dose.  I think her blood pressure can certainly tolerate that.  Will prescribe it and see her back in 4 weeks.  She will call me with any new symptoms particularly with any new episodes of lightheadedness.

## 2017-11-08 NOTE — Telephone Encounter (Signed)
Called pharmacy and asked them to run as name brand Catapres and Rx was approved.  Danley Danker, RN [facility] RN)

## 2017-11-08 NOTE — Assessment & Plan Note (Signed)
Discussed options and treatment

## 2017-11-08 NOTE — Progress Notes (Signed)
    CHIEF COMPLAINT / HPI: #1.  Follow-up recent ED visit for abdominal pain.  Same kind of abdominal pain she has been having.  She got really worried that her pancreas was in trouble so she went to the emergency department.  Pain is now resolved.  She is not being really regular with her fiber supplement. #2.  Brings a note from her mental health provider who wants [name] to consider starting her on clonidine patch for additional help in anger management.  She has been followed by them and is currently compliant with her Latuda. #3.  Having recurrent episodes is of bacterial vaginosis and has been treated a couple times for trichomoniasis.  When she is treated, symptoms go totally away but then after she has intercourse again symptoms return.  She is unsure whether or not her partner is being treated simultaneously.  Not currently having symptoms.  REVIEW OF SYSTEMS: No unusual weight change, no fever, no diarrhea, no constipation.  See HPI.  PERTINENT  PMH / PSH: I have reviewed the patient's medications, allergies, past medical and surgical history, smoking status and updated in the EMR as appropriate.   OBJECTIVE:  Vital signs reviewed. GENERAL: Well-developed, well-nourished, no acute distress. CARDIOVASCULAR: Regular rate and rhythm no murmur gallop or rub LUNGS: Clear to auscultation bilaterally, no rales or wheeze. ABDOMEN: Soft positive bowel sounds NEURO: No gross focal neurological deficits. MSK: Movement of extremity x 4.    ASSESSMENT / PLAN:  Abdominal pain, chronic, epigastric Again discussed her chronic abdominal pain.  Recommended regular use of fiber supplement.  Bipolar affective disorder, currently in remission [facility]) Reviewed clonidine patch with her.  I am fine with starting that at low dose.  I think her blood pressure can certainly tolerate that.  Will prescribe it and see her back in 4 weeks.  She will call me with any new symptoms particularly with any new episodes  of lightheadedness.  HTN (hypertension) Start clonidine as per comments under bipolar disorder diagnosis in this note  Bacterial vaginosis, recurrent Discussed options and treatment

## 2017-11-14 ENCOUNTER — Telehealth: Payer: Self-pay | Admitting: General Practice

## 2017-11-14 ENCOUNTER — Telehealth: Payer: Self-pay

## 2017-11-14 MED ORDER — CEPHALEXIN 500 MG PO CAPS: ORAL_CAPSULE | ORAL | 0 refills | Status: DC

## 2017-11-14 NOTE — Telephone Encounter (Signed)
Dear Holly Mendez Team Please let her know I have called in something for her throat---I DO NOT think it is relaed to her other issue (oral se0 but if she is not better after completion of abx, please come back in to see me [facility]! [name]

## 2017-11-14 NOTE — Telephone Encounter (Signed)
Pt informed. Pt had good understanding and was very grateful for the help. [name], CMA

## 2017-11-14 NOTE — Telephone Encounter (Signed)
Patient left message that at [place] with PCP last week she was treated for trichomoniasis. Stated that she is having throat symptoms that she mentioned to PCP due to also having oral sex. Was told to call PCP back in a week if throat/voice no better and she would be prescribed an antibiotic.  Call back is [phone]  Danley Danker, RN [facility])

## 2017-11-14 NOTE — Telephone Encounter (Signed)
Patient called and left message on nurse voicemail line stating she was given a prescription for megace to control her bleeding. Patient states she has been bleeding since her last visit and the medication hasn't seemed to help. Called patient & asked how often she was taking the medication. Patient reports taking megace 40mg  BID. Discussed with patient she will need to return to the office for follow up since medication isn't helping. Told patient someone from our front office staff will contact her with an appt. Patient verbalized understanding and had no questions.

## 2017-11-15 ENCOUNTER — Ambulatory Visit: Payer: Medicaid Other | Admitting: Obstetrics and Gynecology

## 2017-11-28 ENCOUNTER — Ambulatory Visit (INDEPENDENT_AMBULATORY_CARE_PROVIDER_SITE_OTHER): Payer: Medicaid Other | Admitting: Family Medicine

## 2017-11-28 ENCOUNTER — Encounter: Payer: Self-pay | Admitting: Family Medicine

## 2017-11-28 ENCOUNTER — Other Ambulatory Visit: Payer: Self-pay | Admitting: Family Medicine

## 2017-11-28 DIAGNOSIS — D250 Submucous leiomyoma of uterus: Secondary | ICD-10-CM | POA: Diagnosis present

## 2017-11-28 DIAGNOSIS — N920 Excessive and frequent menstruation with regular cycle: Secondary | ICD-10-CM | POA: Diagnosis not present

## 2017-11-28 NOTE — Telephone Encounter (Signed)
Spoke to pt. She had an appt with [facility] today and it was decided that she will have a full hysterectomy. She goes in Dec for a biopsy and they will schedule the surgery. [name], CMA

## 2017-11-28 NOTE — Progress Notes (Signed)
Pt states she has been bleeding for the last 2 months. Pt reports using half a box of tampons a day. Pt denies pain with the bleeding at this time.

## 2017-11-28 NOTE — Telephone Encounter (Signed)
Dear Holly Mendez Team That is the drug I would use. Does she have follow up with them? Let me know THANKS! [name]

## 2017-11-28 NOTE — Progress Notes (Signed)
   Subjective:    Patient ID: Holly Mendez is a 42 y.o. female presenting with Vaginal Bleeding  on 11/28/2017  HPI: Has h/o BTL and ablation, then increasing bleeding. She is on Megace bid, but bleeding persists. Has ultrasound  That shows 3.1 cm submucosal fibroid. But uterus is 9.7 x 6.2 x 6.0 cm with several fibroids. She is quite concerned about the bleeding impact on her life. S/p SVD x 3. Has PMH of chronic pancreatitis of unclear etiology.  Review of Systems  Constitutional: Negative for chills and fever.  Respiratory: Negative for shortness of breath.   Cardiovascular: Negative for chest pain.  Gastrointestinal: Negative for abdominal pain, nausea and vomiting.  Genitourinary: Negative for dysuria.  Skin: Negative for rash.      Objective:    BP 123/88   Pulse (!) 102   Wt 249 lb 1.6 oz (113 kg)   LMP  (LMP Unknown)   BMI 41.45 kg/m  Physical Exam  Constitutional: She is oriented to person, place, and time. She appears well-developed and well-nourished. No distress.  HENT:  Head: Normocephalic and atraumatic.  Eyes: No scleral icterus.  Neck: Neck supple.  Cardiovascular: Normal rate.  Pulmonary/Chest: Effort normal.  Abdominal: Soft.  Neurological: She is alert and oriented to person, place, and time.  Skin: Skin is warm and dry.  Psychiatric: She has a normal mood and affect.        Assessment & Plan:   Problem List Items Addressed This Visit      Unprioritized   Fibroid uterus    Causing bleeding. Unresponsive to Megace--has previously had ablation. After counseling and consideration of risks, she would like to proceed with hysterectomy. Will proceed with TVH--message sent. Risks include but are not limited to bleeding, infection, injury to surrounding structures, including bowel, bladder and ureters, blood clots, and death.  Likelihood of success is high.       Relevant Medications   megestrol (MEGACE) 40 MG tablet   Menorrhagia   Increase megace to 2 pills (80 mg) bid until surgery. Must return for EMB and pap      Relevant Medications   megestrol (MEGACE) 40 MG tablet      Total face-to-face time with patient: 25 minutes. Over 50% of encounter was spent on counseling and coordination of care. Return in about 3 weeks (around 12/19/2017) for pap and endometrial biopsy.  [name] 11/28/2017 3:21 PM

## 2017-11-28 NOTE — Telephone Encounter (Signed)
Pt is calling to let Dr. Nori Riis know that she did follow up at [facility] hospital concerning her menstrual cycle. Pt has been bleeding constant for 3 months. She was prescribed megestrol 40mg  and it has not improved the bleeding. Pt would like to know if Dr. Nori Riis could prescribe something else. Pt would like for Dr. Nori Riis to call her at [phone].

## 2017-11-29 ENCOUNTER — Encounter: Payer: Self-pay | Admitting: Family Medicine

## 2017-11-29 DIAGNOSIS — D259 Leiomyoma of uterus, unspecified: Secondary | ICD-10-CM | POA: Insufficient documentation

## 2017-11-29 DIAGNOSIS — N920 Excessive and frequent menstruation with regular cycle: Secondary | ICD-10-CM | POA: Insufficient documentation

## 2017-11-29 MED ORDER — MEGESTROL ACETATE 40 MG PO TABS: 80.0000 mg | ORAL_TABLET | Freq: Two times a day (BID) | ORAL | 5 refills | Status: DC

## 2017-11-29 NOTE — Assessment & Plan Note (Signed)
Causing bleeding. Unresponsive to Megace--has previously had ablation. After counseling and consideration of risks, she would like to proceed with hysterectomy. Will proceed with TVH--message sent. Risks include but are not limited to bleeding, infection, injury to surrounding structures, including bowel, bladder and ureters, blood clots, and death.  Likelihood of success is high.

## 2017-11-29 NOTE — Assessment & Plan Note (Addendum)
Increase megace to 2 pills (80 mg) bid until surgery. Must return for EMB and pap

## 2017-11-30 ENCOUNTER — Encounter (HOSPITAL_COMMUNITY): Payer: Self-pay

## 2017-12-03 ENCOUNTER — Other Ambulatory Visit: Payer: Self-pay

## 2017-12-03 ENCOUNTER — Encounter (HOSPITAL_COMMUNITY): Payer: Self-pay

## 2017-12-03 ENCOUNTER — Emergency Department (HOSPITAL_COMMUNITY)
Admission: EM | Admit: 2017-12-03 | Discharge: 2017-12-03 | Disposition: A | Discharge status: Home / Self Care | Payer: Medicaid Other | Attending: Emergency Medicine | Admitting: Emergency Medicine

## 2017-12-03 ENCOUNTER — Other Ambulatory Visit: Payer: Self-pay | Admitting: Family Medicine

## 2017-12-03 DIAGNOSIS — R112 Nausea with vomiting, unspecified: Secondary | ICD-10-CM | POA: Diagnosis not present

## 2017-12-03 DIAGNOSIS — Z87891 Personal history of nicotine dependence: Secondary | ICD-10-CM | POA: Insufficient documentation

## 2017-12-03 DIAGNOSIS — R197 Diarrhea, unspecified: Secondary | ICD-10-CM | POA: Insufficient documentation

## 2017-12-03 DIAGNOSIS — I10 Essential (primary) hypertension: Secondary | ICD-10-CM | POA: Insufficient documentation

## 2017-12-03 DIAGNOSIS — J45909 Unspecified asthma, uncomplicated: Secondary | ICD-10-CM | POA: Insufficient documentation

## 2017-12-03 DIAGNOSIS — R1084 Generalized abdominal pain: Secondary | ICD-10-CM | POA: Diagnosis not present

## 2017-12-03 DIAGNOSIS — Z79899 Other long term (current) drug therapy: Secondary | ICD-10-CM | POA: Insufficient documentation

## 2017-12-03 LAB — COMPREHENSIVE METABOLIC PANEL
ALK PHOS: 72 U/L (ref 38–126)
ALT: 21 U/L (ref 0–44)
ANION GAP: 14 (ref 5–15)
AST: 26 U/L (ref 15–41)
Albumin: 4.4 g/dL (ref 3.5–5.0)
BUN: 8 mg/dL (ref 6–20)
CALCIUM: 9.8 mg/dL (ref 8.9–10.3)
CO2: 25 mmol/L (ref 22–32)
CREATININE: 1.38 mg/dL — AB (ref 0.44–1.00)
Chloride: 99 mmol/L (ref 98–111)
GFR calc Af Amer: 53 mL/min — ABNORMAL LOW (ref >60)
GFR, EST NON AFRICAN AMERICAN: 46 mL/min — AB (ref >60)
GLUCOSE: 137 mg/dL — AB (ref 70–99)
Potassium: 3.3 mmol/L — ABNORMAL LOW (ref 3.5–5.1)
Sodium: 138 mmol/L (ref 135–145)
Total Bilirubin: 0.7 mg/dL (ref 0.3–1.2)
Total Protein: 8.3 g/dL — ABNORMAL HIGH (ref 6.5–8.1)

## 2017-12-03 LAB — CBC WITH DIFFERENTIAL/PLATELET
ABS IMMATURE GRANULOCYTES: 0.05 10*3/uL (ref 0.00–0.07)
BASOS ABS: 0 10*3/uL (ref 0.0–0.1)
Basophils Relative: 0 %
EOS PCT: 0 %
Eosinophils Absolute: 0 10*3/uL (ref 0.0–0.5)
HCT: 38.6 % (ref 36.0–46.0)
HEMOGLOBIN: 12.6 g/dL (ref 12.0–15.0)
Immature Granulocytes: 0 %
LYMPHS PCT: 16 %
Lymphs Abs: 1.8 10*3/uL (ref 0.7–4.0)
MCH: 27.8 pg (ref 26.0–34.0)
MCHC: 32.6 g/dL (ref 30.0–36.0)
MCV: 85.2 fL (ref 80.0–100.0)
Monocytes Absolute: 0.7 10*3/uL (ref 0.1–1.0)
Monocytes Relative: 6 %
NEUTROS ABS: 8.9 10*3/uL — AB (ref 1.7–7.7)
NRBC: 0 % (ref 0.0–0.2)
Neutrophils Relative %: 78 %
Platelets: 332 10*3/uL (ref 150–400)
RBC: 4.53 MIL/uL (ref 3.87–5.11)
RDW: 14.8 % (ref 11.5–15.5)
WBC: 11.5 10*3/uL — ABNORMAL HIGH (ref 4.0–10.5)

## 2017-12-03 LAB — I-STAT BETA HCG BLOOD, ED (MC, WL, AP ONLY)

## 2017-12-03 LAB — LIPASE, BLOOD: Lipase: 24 U/L (ref 11–51)

## 2017-12-03 MED ORDER — DICYCLOMINE HCL 10 MG/ML IM SOLN
20.0000 mg | Freq: Once | INTRAMUSCULAR | Status: AC
  Administered 2017-12-03: 20 mg via INTRAMUSCULAR
  Filled 2017-12-03: qty 2

## 2017-12-03 MED ORDER — DICYCLOMINE HCL 20 MG PO TABS: 20.0000 mg | ORAL_TABLET | Freq: Two times a day (BID) | ORAL | 0 refills | Status: DC

## 2017-12-03 MED ORDER — SODIUM CHLORIDE 0.9 % IV SOLN
INTRAVENOUS | Status: DC
  Administered 2017-12-03: 13:00:00 via INTRAVENOUS

## 2017-12-03 MED ORDER — ONDANSETRON HCL 4 MG/2ML IJ SOLN
4.0000 mg | Freq: Once | INTRAMUSCULAR | Status: AC
  Administered 2017-12-03: 4 mg via INTRAVENOUS
  Filled 2017-12-03: qty 2

## 2017-12-03 MED ORDER — SODIUM CHLORIDE 0.9 % IV BOLUS
1000.0000 mL | Freq: Once | INTRAVENOUS | Status: AC
  Administered 2017-12-03: 1000 mL via INTRAVENOUS

## 2017-12-03 MED ORDER — MORPHINE SULFATE (PF) 4 MG/ML IV SOLN
4.0000 mg | Freq: Once | INTRAVENOUS | Status: AC
  Administered 2017-12-03: 4 mg via INTRAVENOUS
  Filled 2017-12-03: qty 1

## 2017-12-03 MED ORDER — PROMETHAZINE HCL 25 MG RE SUPP: 25.0000 mg | Freq: Four times a day (QID) | RECTAL | 0 refills | Status: DC | PRN

## 2017-12-03 MED ORDER — FENTANYL CITRATE (PF) 100 MCG/2ML IJ SOLN
25.0000 ug | Freq: Once | INTRAMUSCULAR | Status: AC
  Administered 2017-12-03: 25 ug via INTRAVENOUS
  Filled 2017-12-03: qty 2

## 2017-12-03 MED ORDER — PROMETHAZINE HCL 25 MG/ML IJ SOLN
25.0000 mg | Freq: Once | INTRAMUSCULAR | Status: AC
  Administered 2017-12-03: 25 mg via INTRAVENOUS
  Filled 2017-12-03: qty 1

## 2017-12-03 NOTE — ED Notes (Signed)
Pt requesting nausea medicine. MD notified

## 2017-12-03 NOTE — ED Notes (Signed)
Patient verbalizes understanding of discharge instructions. Opportunity for questioning and answers were provided. Armband removed by staff, pt discharged from ED ambulatory to home.  

## 2017-12-03 NOTE — Discharge Instructions (Signed)
As discussed, your evaluation today has been largely reassuring.  But, it is important that you monitor your condition carefully, and do not hesitate to return to the ED if you develop new, or concerning changes in your condition. ? ?Otherwise, please follow-up with your physician for appropriate ongoing care. ? ?

## 2017-12-03 NOTE — ED Notes (Signed)
Got patient undress on the monitor did ekg shown to Dr Vanita Panda
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
## 2017-12-03 NOTE — ED Triage Notes (Signed)
Pt arrives via [facility] c/o abd pain worsening since yesterday on lower abdomen across. States she has had N/V with scant blood present.  LBM around 5 days ago. Hx: pancreatitis, urterine fibroids with vaginal bleeding x 3 months with scheduled hysterectomy pending (12/19/17).  GCE/MS vitals CBG 173 148/86 100-114 hr 98 ra 18-20 rr

## 2017-12-04 ENCOUNTER — Inpatient Hospital Stay (HOSPITAL_COMMUNITY)
Admission: EM | Admit: 2017-12-04 | Discharge: 2017-12-07 | DRG: 392 | Disposition: A | Discharge status: Home / Self Care | Payer: Medicaid Other | Attending: Internal Medicine | Admitting: Internal Medicine

## 2017-12-04 ENCOUNTER — Emergency Department (HOSPITAL_COMMUNITY): Payer: Medicaid Other

## 2017-12-04 ENCOUNTER — Encounter (HOSPITAL_COMMUNITY): Payer: Self-pay

## 2017-12-04 DIAGNOSIS — R109 Unspecified abdominal pain: Secondary | ICD-10-CM | POA: Diagnosis present

## 2017-12-04 DIAGNOSIS — Z79899 Other long term (current) drug therapy: Secondary | ICD-10-CM

## 2017-12-04 DIAGNOSIS — R112 Nausea with vomiting, unspecified: Secondary | ICD-10-CM

## 2017-12-04 DIAGNOSIS — N179 Acute kidney failure, unspecified: Secondary | ICD-10-CM | POA: Diagnosis present

## 2017-12-04 DIAGNOSIS — I10 Essential (primary) hypertension: Secondary | ICD-10-CM | POA: Diagnosis present

## 2017-12-04 DIAGNOSIS — G8929 Other chronic pain: Secondary | ICD-10-CM | POA: Diagnosis present

## 2017-12-04 DIAGNOSIS — Z7951 Long term (current) use of inhaled steroids: Secondary | ICD-10-CM

## 2017-12-04 DIAGNOSIS — Z90710 Acquired absence of both cervix and uterus: Secondary | ICD-10-CM

## 2017-12-04 DIAGNOSIS — Z886 Allergy status to analgesic agent status: Secondary | ICD-10-CM

## 2017-12-04 DIAGNOSIS — F319 Bipolar disorder, unspecified: Secondary | ICD-10-CM | POA: Diagnosis present

## 2017-12-04 DIAGNOSIS — J45909 Unspecified asthma, uncomplicated: Secondary | ICD-10-CM | POA: Diagnosis present

## 2017-12-04 DIAGNOSIS — H5462 Unqualified visual loss, left eye, normal vision right eye: Secondary | ICD-10-CM | POA: Diagnosis present

## 2017-12-04 DIAGNOSIS — K5900 Constipation, unspecified: Secondary | ICD-10-CM | POA: Diagnosis present

## 2017-12-04 DIAGNOSIS — K210 Gastro-esophageal reflux disease with esophagitis: Principal | ICD-10-CM | POA: Diagnosis present

## 2017-12-04 DIAGNOSIS — E876 Hypokalemia: Secondary | ICD-10-CM | POA: Diagnosis present

## 2017-12-04 DIAGNOSIS — E860 Dehydration: Secondary | ICD-10-CM | POA: Diagnosis present

## 2017-12-04 DIAGNOSIS — R1013 Epigastric pain: Secondary | ICD-10-CM

## 2017-12-04 DIAGNOSIS — R1084 Generalized abdominal pain: Secondary | ICD-10-CM

## 2017-12-04 DIAGNOSIS — M545 Low back pain: Secondary | ICD-10-CM | POA: Diagnosis present

## 2017-12-04 DIAGNOSIS — R102 Pelvic and perineal pain: Secondary | ICD-10-CM | POA: Diagnosis present

## 2017-12-04 DIAGNOSIS — Z888 Allergy status to other drugs, medicaments and biological substances status: Secondary | ICD-10-CM

## 2017-12-04 DIAGNOSIS — E7800 Pure hypercholesterolemia, unspecified: Secondary | ICD-10-CM | POA: Diagnosis present

## 2017-12-04 DIAGNOSIS — M797 Fibromyalgia: Secondary | ICD-10-CM | POA: Diagnosis present

## 2017-12-04 DIAGNOSIS — R824 Acetonuria: Secondary | ICD-10-CM | POA: Diagnosis present

## 2017-12-04 DIAGNOSIS — Z6839 Body mass index (BMI) 39.0-39.9, adult: Secondary | ICD-10-CM

## 2017-12-04 DIAGNOSIS — R300 Dysuria: Secondary | ICD-10-CM | POA: Diagnosis present

## 2017-12-04 DIAGNOSIS — D259 Leiomyoma of uterus, unspecified: Secondary | ICD-10-CM | POA: Diagnosis present

## 2017-12-04 DIAGNOSIS — F259 Schizoaffective disorder, unspecified: Secondary | ICD-10-CM | POA: Diagnosis present

## 2017-12-04 DIAGNOSIS — E785 Hyperlipidemia, unspecified: Secondary | ICD-10-CM | POA: Diagnosis present

## 2017-12-04 DIAGNOSIS — K279 Peptic ulcer, site unspecified, unspecified as acute or chronic, without hemorrhage or perforation: Secondary | ICD-10-CM | POA: Diagnosis present

## 2017-12-04 DIAGNOSIS — Z970 Presence of artificial eye: Secondary | ICD-10-CM

## 2017-12-04 DIAGNOSIS — N938 Other specified abnormal uterine and vaginal bleeding: Secondary | ICD-10-CM | POA: Diagnosis present

## 2017-12-04 LAB — COMPREHENSIVE METABOLIC PANEL
ALBUMIN: 4.5 g/dL (ref 3.5–5.0)
ALK PHOS: 79 U/L (ref 38–126)
ALT: 24 U/L (ref 0–44)
AST: 30 U/L (ref 15–41)
Anion gap: 12 (ref 5–15)
BUN: 12 mg/dL (ref 6–20)
CALCIUM: 9.7 mg/dL (ref 8.9–10.3)
CO2: 29 mmol/L (ref 22–32)
CREATININE: 1.19 mg/dL — AB (ref 0.44–1.00)
Chloride: 101 mmol/L (ref 98–111)
GFR calc Af Amer: >60 mL/min (ref >60)
GFR calc non Af Amer: 55 mL/min — ABNORMAL LOW (ref >60)
Glucose, Bld: 124 mg/dL — ABNORMAL HIGH (ref 70–99)
Potassium: 3.3 mmol/L — ABNORMAL LOW (ref 3.5–5.1)
SODIUM: 142 mmol/L (ref 135–145)
Total Bilirubin: 0.9 mg/dL (ref 0.3–1.2)
Total Protein: 8.9 g/dL — ABNORMAL HIGH (ref 6.5–8.1)

## 2017-12-04 LAB — CBC
HEMATOCRIT: 41.7 % (ref 36.0–46.0)
Hemoglobin: 13.8 g/dL (ref 12.0–15.0)
MCH: 27.8 pg (ref 26.0–34.0)
MCHC: 33.1 g/dL (ref 30.0–36.0)
MCV: 84.1 fL (ref 80.0–100.0)
PLATELETS: 377 10*3/uL (ref 150–400)
RBC: 4.96 MIL/uL (ref 3.87–5.11)
RDW: 14.9 % (ref 11.5–15.5)
WBC: 10.9 10*3/uL — ABNORMAL HIGH (ref 4.0–10.5)
nRBC: 0 % (ref 0.0–0.2)

## 2017-12-04 LAB — URINALYSIS, ROUTINE W REFLEX MICROSCOPIC
Bacteria, UA: NONE SEEN
Bilirubin Urine: NEGATIVE
Glucose, UA: NEGATIVE mg/dL
Ketones, ur: 20 mg/dL — AB
Leukocytes, UA: NEGATIVE
Nitrite: NEGATIVE
Protein, ur: NEGATIVE mg/dL
SPECIFIC GRAVITY, URINE: 1.016 (ref 1.005–1.030)
pH: 7 (ref 5.0–8.0)

## 2017-12-04 LAB — LIPASE, BLOOD: Lipase: 32 U/L (ref 11–51)

## 2017-12-04 LAB — I-STAT BETA HCG BLOOD, ED (MC, WL, AP ONLY)

## 2017-12-04 MED ORDER — PROCHLORPERAZINE EDISYLATE 10 MG/2ML IJ SOLN
10.0000 mg | Freq: Once | INTRAMUSCULAR | Status: AC
  Administered 2017-12-04: 10 mg via INTRAVENOUS
  Filled 2017-12-04: qty 2

## 2017-12-04 MED ORDER — ONDANSETRON 4 MG PO TBDP: ORAL_TABLET | ORAL | 0 refills | Status: DC

## 2017-12-04 MED ORDER — SODIUM CHLORIDE 0.9 % IV BOLUS
1000.0000 mL | Freq: Once | INTRAVENOUS | Status: AC
  Administered 2017-12-04: 1000 mL via INTRAVENOUS

## 2017-12-04 MED ORDER — FENTANYL CITRATE (PF) 100 MCG/2ML IJ SOLN
50.0000 ug | Freq: Once | INTRAMUSCULAR | Status: AC
  Administered 2017-12-04: 50 ug via INTRAVENOUS
  Filled 2017-12-04: qty 2

## 2017-12-04 MED ORDER — PROMETHAZINE HCL 25 MG/ML IJ SOLN
25.0000 mg | Freq: Once | INTRAMUSCULAR | Status: AC
  Administered 2017-12-04: 25 mg via INTRAVENOUS
  Filled 2017-12-04: qty 1

## 2017-12-04 NOTE — ED Notes (Signed)
Pt given a heating pack to aid in easing of abdominal pain

## 2017-12-04 NOTE — ED Triage Notes (Signed)
Pt presents from home with c/o abdominal pain. Pt has a hx of pancreatitis, was seen yesterday for same. Pt reporting N/V.
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
## 2017-12-05 ENCOUNTER — Emergency Department (HOSPITAL_COMMUNITY): Payer: Medicaid Other

## 2017-12-05 ENCOUNTER — Encounter (HOSPITAL_COMMUNITY): Payer: Self-pay

## 2017-12-05 ENCOUNTER — Other Ambulatory Visit: Payer: Self-pay

## 2017-12-05 DIAGNOSIS — E876 Hypokalemia: Secondary | ICD-10-CM | POA: Diagnosis present

## 2017-12-05 DIAGNOSIS — G8929 Other chronic pain: Secondary | ICD-10-CM | POA: Diagnosis present

## 2017-12-05 DIAGNOSIS — R300 Dysuria: Secondary | ICD-10-CM | POA: Diagnosis present

## 2017-12-05 DIAGNOSIS — N938 Other specified abnormal uterine and vaginal bleeding: Secondary | ICD-10-CM | POA: Diagnosis present

## 2017-12-05 DIAGNOSIS — R102 Pelvic and perineal pain: Secondary | ICD-10-CM | POA: Diagnosis present

## 2017-12-05 DIAGNOSIS — K5900 Constipation, unspecified: Secondary | ICD-10-CM | POA: Diagnosis present

## 2017-12-05 DIAGNOSIS — E860 Dehydration: Secondary | ICD-10-CM | POA: Diagnosis present

## 2017-12-05 DIAGNOSIS — M797 Fibromyalgia: Secondary | ICD-10-CM | POA: Diagnosis present

## 2017-12-05 DIAGNOSIS — M545 Low back pain: Secondary | ICD-10-CM | POA: Diagnosis present

## 2017-12-05 DIAGNOSIS — I10 Essential (primary) hypertension: Secondary | ICD-10-CM | POA: Diagnosis present

## 2017-12-05 DIAGNOSIS — Z970 Presence of artificial eye: Secondary | ICD-10-CM | POA: Diagnosis not present

## 2017-12-05 DIAGNOSIS — Z6839 Body mass index (BMI) 39.0-39.9, adult: Secondary | ICD-10-CM | POA: Diagnosis not present

## 2017-12-05 DIAGNOSIS — E7800 Pure hypercholesterolemia, unspecified: Secondary | ICD-10-CM | POA: Diagnosis present

## 2017-12-05 DIAGNOSIS — R824 Acetonuria: Secondary | ICD-10-CM | POA: Diagnosis present

## 2017-12-05 DIAGNOSIS — F319 Bipolar disorder, unspecified: Secondary | ICD-10-CM | POA: Diagnosis present

## 2017-12-05 DIAGNOSIS — H5462 Unqualified visual loss, left eye, normal vision right eye: Secondary | ICD-10-CM | POA: Diagnosis present

## 2017-12-05 DIAGNOSIS — D259 Leiomyoma of uterus, unspecified: Secondary | ICD-10-CM | POA: Diagnosis present

## 2017-12-05 DIAGNOSIS — K279 Peptic ulcer, site unspecified, unspecified as acute or chronic, without hemorrhage or perforation: Secondary | ICD-10-CM | POA: Diagnosis present

## 2017-12-05 DIAGNOSIS — R109 Unspecified abdominal pain: Secondary | ICD-10-CM | POA: Diagnosis not present

## 2017-12-05 DIAGNOSIS — E785 Hyperlipidemia, unspecified: Secondary | ICD-10-CM | POA: Diagnosis present

## 2017-12-05 DIAGNOSIS — N179 Acute kidney failure, unspecified: Secondary | ICD-10-CM | POA: Diagnosis present

## 2017-12-05 DIAGNOSIS — F259 Schizoaffective disorder, unspecified: Secondary | ICD-10-CM | POA: Diagnosis present

## 2017-12-05 DIAGNOSIS — R1084 Generalized abdominal pain: Secondary | ICD-10-CM | POA: Diagnosis not present

## 2017-12-05 DIAGNOSIS — J45909 Unspecified asthma, uncomplicated: Secondary | ICD-10-CM | POA: Diagnosis present

## 2017-12-05 DIAGNOSIS — K210 Gastro-esophageal reflux disease with esophagitis: Secondary | ICD-10-CM | POA: Diagnosis present

## 2017-12-05 DIAGNOSIS — R112 Nausea with vomiting, unspecified: Secondary | ICD-10-CM | POA: Diagnosis not present

## 2017-12-05 HISTORY — DX: Unspecified abdominal pain: R10.9

## 2017-12-05 LAB — CBC WITH DIFFERENTIAL/PLATELET
ABS IMMATURE GRANULOCYTES: 0.04 10*3/uL (ref 0.00–0.07)
Basophils Absolute: 0 10*3/uL (ref 0.0–0.1)
Basophils Relative: 0 %
EOS PCT: 0 %
Eosinophils Absolute: 0 10*3/uL (ref 0.0–0.5)
HEMATOCRIT: 39.3 % (ref 36.0–46.0)
HEMOGLOBIN: 12.8 g/dL (ref 12.0–15.0)
Immature Granulocytes: 0 %
LYMPHS PCT: 39 %
Lymphs Abs: 4.2 10*3/uL — ABNORMAL HIGH (ref 0.7–4.0)
MCH: 27.8 pg (ref 26.0–34.0)
MCHC: 32.6 g/dL (ref 30.0–36.0)
MCV: 85.4 fL (ref 80.0–100.0)
MONO ABS: 1.2 10*3/uL — AB (ref 0.1–1.0)
MONOS PCT: 11 %
NEUTROS ABS: 5.2 10*3/uL (ref 1.7–7.7)
Neutrophils Relative %: 50 %
Platelets: 303 10*3/uL (ref 150–400)
RBC: 4.6 MIL/uL (ref 3.87–5.11)
RDW: 14.9 % (ref 11.5–15.5)
WBC: 10.7 10*3/uL — AB (ref 4.0–10.5)
nRBC: 0 % (ref 0.0–0.2)

## 2017-12-05 LAB — BASIC METABOLIC PANEL
Anion gap: 10 (ref 5–15)
BUN: 11 mg/dL (ref 6–20)
CHLORIDE: 101 mmol/L (ref 98–111)
CO2: 31 mmol/L (ref 22–32)
CREATININE: 1.04 mg/dL — AB (ref 0.44–1.00)
Calcium: 9.3 mg/dL (ref 8.9–10.3)
GFR calc Af Amer: >60 mL/min (ref >60)
GFR calc non Af Amer: >60 mL/min (ref >60)
GLUCOSE: 126 mg/dL — AB (ref 70–99)
POTASSIUM: 3.3 mmol/L — AB (ref 3.5–5.1)
Sodium: 142 mmol/L (ref 135–145)

## 2017-12-05 LAB — PHOSPHORUS: Phosphorus: 3.9 mg/dL (ref 2.5–4.6)

## 2017-12-05 LAB — MAGNESIUM: MAGNESIUM: 2.2 mg/dL (ref 1.7–2.4)

## 2017-12-05 MED ORDER — POTASSIUM CHLORIDE IN NACL 40-0.9 MEQ/L-% IV SOLN
INTRAVENOUS | Status: DC
  Administered 2017-12-05: 250 mL/h via INTRAVENOUS
  Filled 2017-12-05: qty 1000

## 2017-12-05 MED ORDER — PROMETHAZINE HCL 25 MG/ML IJ SOLN
12.5000 mg | Freq: Four times a day (QID) | INTRAMUSCULAR | Status: DC | PRN
  Administered 2017-12-06 – 2017-12-07 (×2): 12.5 mg via INTRAVENOUS
  Filled 2017-12-05 (×2): qty 1

## 2017-12-05 MED ORDER — ONDANSETRON HCL 4 MG PO TABS: 4.0000 mg | ORAL_TABLET | Freq: Four times a day (QID) | ORAL | Status: DC | PRN

## 2017-12-05 MED ORDER — ACETAMINOPHEN 325 MG PO TABS
650.0000 mg | ORAL_TABLET | Freq: Four times a day (QID) | ORAL | Status: DC | PRN
  Filled 2017-12-05: qty 2

## 2017-12-05 MED ORDER — ONDANSETRON HCL 4 MG/2ML IJ SOLN
4.0000 mg | Freq: Four times a day (QID) | INTRAMUSCULAR | Status: DC
  Administered 2017-12-05 – 2017-12-06 (×4): 4 mg via INTRAVENOUS
  Filled 2017-12-05 (×4): qty 2

## 2017-12-05 MED ORDER — POTASSIUM CHLORIDE 10 MEQ/100ML IV SOLN
10.0000 meq | INTRAVENOUS | Status: AC
  Administered 2017-12-05 (×4): 10 meq via INTRAVENOUS
  Filled 2017-12-05 (×4): qty 100

## 2017-12-05 MED ORDER — ONDANSETRON HCL 4 MG/2ML IJ SOLN
4.0000 mg | Freq: Once | INTRAMUSCULAR | Status: AC
  Administered 2017-12-05: 4 mg via INTRAVENOUS
  Filled 2017-12-05: qty 2

## 2017-12-05 MED ORDER — IOPAMIDOL (ISOVUE-300) INJECTION 61%
INTRAVENOUS | Status: AC
  Filled 2017-12-05: qty 100

## 2017-12-05 MED ORDER — ONDANSETRON HCL 4 MG/2ML IJ SOLN
4.0000 mg | Freq: Four times a day (QID) | INTRAMUSCULAR | Status: DC | PRN
  Administered 2017-12-05 (×2): 4 mg via INTRAVENOUS
  Filled 2017-12-05 (×2): qty 2

## 2017-12-05 MED ORDER — BISACODYL 10 MG RE SUPP
10.0000 mg | Freq: Once | RECTAL | Status: AC
  Administered 2017-12-05: 10 mg via RECTAL
  Filled 2017-12-05: qty 1

## 2017-12-05 MED ORDER — ACETAMINOPHEN 650 MG RE SUPP: 650.0000 mg | Freq: Four times a day (QID) | RECTAL | Status: DC | PRN

## 2017-12-05 MED ORDER — PANTOPRAZOLE SODIUM 40 MG IV SOLR
40.0000 mg | Freq: Two times a day (BID) | INTRAVENOUS | Status: DC
  Administered 2017-12-05 – 2017-12-07 (×5): 40 mg via INTRAVENOUS
  Filled 2017-12-05 (×5): qty 40

## 2017-12-05 MED ORDER — FAMOTIDINE IN NACL 20-0.9 MG/50ML-% IV SOLN
20.0000 mg | Freq: Two times a day (BID) | INTRAVENOUS | Status: DC
  Administered 2017-12-05: 20 mg via INTRAVENOUS
  Filled 2017-12-05 (×2): qty 50

## 2017-12-05 MED ORDER — IOPAMIDOL (ISOVUE-300) INJECTION 61%
100.0000 mL | Freq: Once | INTRAVENOUS | Status: AC | PRN
  Administered 2017-12-05: 100 mL via INTRAVENOUS

## 2017-12-05 MED ORDER — POTASSIUM CHLORIDE IN NACL 20-0.9 MEQ/L-% IV SOLN
INTRAVENOUS | Status: DC
  Administered 2017-12-05 – 2017-12-07 (×5): via INTRAVENOUS
  Filled 2017-12-05 (×6): qty 1000

## 2017-12-05 MED ORDER — HYDROMORPHONE HCL 1 MG/ML IJ SOLN
1.0000 mg | INTRAMUSCULAR | Status: DC | PRN
  Administered 2017-12-05 – 2017-12-06 (×7): 1 mg via INTRAVENOUS
  Filled 2017-12-05 (×7): qty 1

## 2017-12-05 MED ORDER — SODIUM CHLORIDE (PF) 0.9 % IJ SOLN
INTRAMUSCULAR | Status: AC
  Filled 2017-12-05: qty 50

## 2017-12-05 MED ORDER — LABETALOL HCL 5 MG/ML IV SOLN
20.0000 mg | INTRAVENOUS | Status: DC | PRN
  Administered 2017-12-07: 20 mg via INTRAVENOUS
  Filled 2017-12-05 (×3): qty 4

## 2017-12-05 NOTE — Plan of Care (Signed)
Patient medicated for abd pain x 3 this shift with improvement.  Patient attempted clear liquids, had immediate vomiting.  Patient did pass a small amount of stool after receiving Dulcolax suppository.
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
## 2017-12-05 NOTE — Progress Notes (Signed)
PROGRESS NOTE   Holly Mendez  XIH:[id number]    DOB: Jun 17, 1975    DOA: 12/04/2017  PCP: Dickie La, MD   I have briefly reviewed patients previous medical records in [facility].  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  CT abdomen pelvis without acute findings.  Admitted for intractable abdominal pain, nausea and vomiting.  Supportive care.   Assessment & Plan:   Principal Problem:   Intractable abdominal pain Active Problems:   HTN (hypertension)   Abdominal pain, chronic, epigastric   Hypokalemia   Asthma   Bipolar disorder ([facility])   Intractable abdominal pain, nausea and nonbloody emesis: Etiology not clear.  KUB and CT abdomen and pelvis with contrast without acute findings.  Reports that she has such presentation approximately 3-4 times a year.  DD: Gastritis/esophagitis, PUD versus other etiologies, ? Fibroids.  EGD 2013 had shown healing esophagitis.  Tried clear liquids this morning but patient promptly vomited.  Placed on scheduled IV Zofran, twice daily PPI, PRN IV Phenergan, continued IV hydration and monitor closely.  If not improving or worsens, may need to consider GI consultation for further evaluation.  Essential hypertension: Uncontrolled.  Unable to take p.o. meds consistently PTA.  PRN IV labetalol.  Hypokalemia: Secondary to poor oral intake and GI losses.  Replace IV and follow.  Acute kidney injury: Creatinine 1.38 on 11/25.  Secondary to dehydration.  Resolved.   Continue IV fluids and follow.  Asthma: Stable without clinical bronchospasm.  Bipolar/schizoaffective disorder: Stable but has been unable to take her oral medications for several days.  Patient states that she follows with Dr. Tinnie Gens, psychiatry.  Fibroid uterus/DUB: Patient reports that she follows with [facility], supposed to have some kind of biopsy on 12/11 followed by hysterectomy in January of next year.  Hemoglobin stable.  Reports that she is currently having menstrual bleeding.  Constipation: Last BM approximately 8 days PTA.  Trial of Dulcolax suppository.  Dysuria: Urine microscopy not impressive for UTI.  Hold off treating with antibiotics.  IV fluids.  Morbid obesity/Body mass index is 39.9 kg/m.   DVT prophylaxis: SCDs Code Status: Full Family Communication: None at bedside Disposition: DC home pending clinical improvement.  Since patient has ongoing intractable lower abdominal pain, nausea and vomiting, unable to keep down oral intake including food or medications, she will need to be monitored and treated in the hospital until improvement and hence will need an additional night stay.  Thereby will transition to inpatient status.   Consultants:  None  Procedures:  None  Antimicrobials:  None   Subjective: Interviewed and examined along with RN this morning.  Ongoing suprapubic abdominal pain, 7/10, nonradiating, constant, no aggravating or relieving factors, associated with occasional dysuria, currently menstruating and has been going on for last 3 weeks, reportedly changes 6-7 pads per day.  Vomited up clear liquids that she took this morning.  ROS: As above, otherwise negative.  Objective:  Vitals:   12/05/17 0244 12/05/17 0424 12/05/17 0427 12/05/17 1336  BP: 117/78 (!) 136/95  (!) 156/94  Pulse:  90 (!) 107  79  Resp: 16 18  18   Temp:  100.1 F (37.8 C)  99.1 F (37.3 C)  TempSrc:  Oral  Oral  SpO2: 97% 99%    Weight:   108.8 kg     Height:   5\' 5"  (1.651 m)     Examination:  General exam: Pleasant young female, moderately built and morbidly obese lying propped up in bed.  Oral mucosa dry. Respiratory system: Clear to auscultation. Respiratory effort normal. Cardiovascular system: S1 & S2 heard, RRR. No JVD, murmurs, rubs, gallops or clicks. No pedal edema. Gastrointestinal system: Abdomen is nondistended, soft.  Mild suprapubic tenderness without peritoneal signs. No organomegaly or masses felt. Normal bowel sounds heard. Central nervous system: Alert and oriented. No focal neurological deficits. Extremities: Symmetric 5 x 5 power. Skin: No rashes, lesions or ulcers Psychiatry: Judgement and insight appear normal. Mood & affect appropriate.     Data Reviewed: I have personally reviewed following labs and imaging studies  CBC: Recent Labs  Lab 12/03/17 1119 12/04/17 1450 12/05/17 0601  WBC 11.5* 10.9* 10.7*  NEUTROABS 8.9*  --  5.2  HGB 12.6 13.8 12.8  HCT 38.6 41.7 39.3  MCV 85.2 84.1 85.4  PLT 332 377 465   Basic Metabolic Panel: Recent Labs  Lab 12/03/17 1119 12/04/17 1450 12/05/17 0601  NA 138 142 142  K 3.3* 3.3* 3.3*  CL 99 101 101  CO2 25 29 31   GLUCOSE 137* 124* 126*  BUN 8 12 11   CREATININE 1.38* 1.19* 1.04*  CALCIUM 9.8 9.7 9.3  MG  --   --  2.2  PHOS  --   --  3.9   Liver Function Tests: Recent Labs  Lab 12/03/17 1119 12/04/17 1450  AST 26 30  ALT 21 24  ALKPHOS 72 79  BILITOT 0.7 0.9  PROT 8.3* 8.9*  ALBUMIN 4.4 4.5     Radiology Studies: Ct Abdomen Pelvis W Contrast  Result Date: 12/05/2017 CLINICAL DATA:  Nausea, vomiting.  Abd pain, acute, generalized EXAM: CT ABDOMEN AND PELVIS WITH CONTRAST TECHNIQUE: Multidetector CT imaging of the abdomen and pelvis was performed using the standard protocol following bolus administration of intravenous contrast. CONTRAST:  164mL ISOVUE-300 IOPAMIDOL (ISOVUE-300) INJECTION 61% COMPARISON:  Radiograph yesterday. Multiple prior  CT most recently 10/11/2017 FINDINGS: Lower chest: No consolidation or pleural fluid. Previous subpleural nodule in the right middle lobe is not seen, may be excluded from the field of view. Hepatobiliary: No focal hepatic abnormality. Liver slightly prominent size spanning 20 cm cranial caudal. Gallbladder physiologically distended, no calcified stone. No biliary dilatation. Pancreas: No ductal dilatation or inflammation. Spleen: Normal in size without focal abnormality. Splenule inferiorly. Adrenals/Urinary Tract: Normal adrenal glands. No hydronephrosis or perinephric edema. Homogeneous renal enhancement with symmetric excretion on delayed phase imaging. Urinary bladder is physiologically distended without wall thickening. Stomach/Bowel: Small hiatal hernia. Appendix appears normal. No evidence of bowel wall thickening, distention, or inflammatory changes. Vascular/Lymphatic: No significant vascular findings are present. No enlarged abdominal or pelvic lymph nodes. Reproductive: Enlarged fibroid uterus as before. Tampon in the vagina. No adnexal mass. Other: Small umbilical hernia contains fat and nonobstructed noninflamed small bowel no free air, free fluid, or intra-abdominal fluid collection. Musculoskeletal: There are no acute or suspicious osseous abnormalities. Sclerosis of bilateral sacroiliac joints, right greater than left, unchanged. IMPRESSION: 1. No acute findings in the abdomen/pelvis. 2. Small umbilical hernia contains fat and minimal small bowel, no obstruction or inflammation. 3. Chronic findings of hiatal  hernia and enlarged fibroid uterus. Electronically Signed   By: Keith Rake M.D.   On: 12/05/2017 03:17   Dg Abd 2 Views  Result Date: 12/04/2017 CLINICAL DATA:  Abdominal pain. EXAM: ABDOMEN - 2 VIEW COMPARISON:  CT scan of October 11, 2017. Radiographs of October 10, 2017. FINDINGS: The bowel gas pattern is normal. There is no evidence of free air. No radio-opaque calculi or other  significant radiographic abnormality is seen. IMPRESSION: No evidence of bowel obstruction or ileus. Electronically Signed   By: Marijo Conception, M.D.   On: 12/04/2017 17:52        Scheduled Meds: Continuous Infusions: . 0.9 % NaCl with KCl 20 mEq / L 125 mL/hr at 12/05/17 0919  . famotidine (PEPCID) IV 20 mg (12/05/17 0924)     LOS: 0 days     Vernell Leep, MD, FACP, [facility]. [facility] Pager [phone]  If 7PM-7AM, please contact night-coverage [email or web address] Password [facility] 12/05/2017, 3:18 PM
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
## 2017-12-05 NOTE — Progress Notes (Signed)
Patient ate jello and an [place] ice, immediately vomited after consuming this.  Zofran given IV.
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
## 2017-12-05 NOTE — Progress Notes (Signed)
Patient attempted to drink some tea and ate an [place] ice after which she vomited.  Scheduled Zofran administered.  MD notified.

## 2017-12-06 DIAGNOSIS — E876 Hypokalemia: Secondary | ICD-10-CM

## 2017-12-06 DIAGNOSIS — R112 Nausea with vomiting, unspecified: Secondary | ICD-10-CM

## 2017-12-06 LAB — BASIC METABOLIC PANEL
Anion gap: 7 (ref 5–15)
BUN: 6 mg/dL (ref 6–20)
CALCIUM: 8.6 mg/dL — AB (ref 8.9–10.3)
CO2: 26 mmol/L (ref 22–32)
CREATININE: 0.86 mg/dL (ref 0.44–1.00)
Chloride: 105 mmol/L (ref 98–111)
Glucose, Bld: 107 mg/dL — ABNORMAL HIGH (ref 70–99)
Potassium: 3.2 mmol/L — ABNORMAL LOW (ref 3.5–5.1)
SODIUM: 138 mmol/L (ref 135–145)

## 2017-12-06 MED ORDER — AMITRIPTYLINE HCL 25 MG PO TABS
75.0000 mg | ORAL_TABLET | Freq: Every day | ORAL | Status: DC
  Administered 2017-12-06: 75 mg via ORAL
  Filled 2017-12-06: qty 3

## 2017-12-06 MED ORDER — HYDROMORPHONE HCL 1 MG/ML IJ SOLN
0.5000 mg | INTRAMUSCULAR | Status: DC | PRN
  Administered 2017-12-06 – 2017-12-07 (×5): 0.5 mg via INTRAVENOUS
  Filled 2017-12-06 (×5): qty 0.5

## 2017-12-06 MED ORDER — POTASSIUM CHLORIDE 10 MEQ/100ML IV SOLN
10.0000 meq | Freq: Once | INTRAVENOUS | Status: AC
  Administered 2017-12-06: 10 meq via INTRAVENOUS
  Filled 2017-12-06: qty 100

## 2017-12-06 MED ORDER — POTASSIUM CHLORIDE 10 MEQ/100ML IV SOLN
10.0000 meq | INTRAVENOUS | Status: AC
  Administered 2017-12-06 (×5): 10 meq via INTRAVENOUS
  Filled 2017-12-06 (×5): qty 100

## 2017-12-06 MED ORDER — ONDANSETRON HCL 4 MG/2ML IJ SOLN
4.0000 mg | Freq: Four times a day (QID) | INTRAMUSCULAR | Status: DC | PRN
  Administered 2017-12-07: 4 mg via INTRAVENOUS
  Filled 2017-12-06: qty 2

## 2017-12-06 MED ORDER — MOMETASONE FURO-FORMOTEROL FUM 100-5 MCG/ACT IN AERO
2.0000 | INHALATION_SPRAY | Freq: Two times a day (BID) | RESPIRATORY_TRACT | Status: DC
  Filled 2017-12-06: qty 8.8

## 2017-12-06 MED ORDER — DIPHENHYDRAMINE HCL 25 MG PO CAPS
25.0000 mg | ORAL_CAPSULE | ORAL | Status: DC | PRN
  Administered 2017-12-06: 25 mg via ORAL
  Filled 2017-12-06: qty 1

## 2017-12-06 MED ORDER — DIPHENHYDRAMINE HCL 50 MG PO CAPS
50.0000 mg | ORAL_CAPSULE | Freq: Once | ORAL | Status: AC
  Administered 2017-12-06: 50 mg via ORAL
  Filled 2017-12-06: qty 1

## 2017-12-06 MED ORDER — LURASIDONE HCL 60 MG PO TABS
60.0000 mg | ORAL_TABLET | Freq: Every day | ORAL | Status: DC
  Administered 2017-12-06: 60 mg via ORAL
  Filled 2017-12-06 (×2): qty 1

## 2017-12-06 NOTE — Plan of Care (Signed)
  Problem: Education: Goal: Knowledge of General Education information will improve Description Including pain rating scale, medication(s)/side effects and non-pharmacologic comfort measures Outcome: Progressing   Problem: Health Behavior/Discharge Planning: Goal: Ability to manage health-related needs will improve Outcome: Progressing   Problem: Clinical Measurements: Goal: Diagnostic test results will improve Outcome: Progressing Goal: Respiratory complications will improve Outcome: Progressing   Problem: Pain Managment: Goal: General experience of comfort will improve Outcome: Progressing   Problem: Safety: Goal: Ability to remain free from injury will improve Outcome: Progressing

## 2017-12-06 NOTE — Progress Notes (Addendum)
PROGRESS NOTE   Holly Mendez  LDJ:[id number]    DOB: 09-11-1975    DOA: 12/04/2017  PCP: Dickie La, MD   I have briefly reviewed patients previous medical records in [facility].  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  CT abdomen pelvis without acute findings.  Admitted for intractable abdominal pain, nausea and vomiting.  Supportive care.  Improving.   Assessment & Plan:   Principal Problem:   Intractable abdominal pain Active Problems:   HTN (hypertension)   Abdominal pain, chronic, epigastric   Hypokalemia   Asthma   Bipolar disorder (HCC)   Abdominal pain   Intractable abdominal pain, nausea and nonbloody emesis: Etiology not clear.  KUB and CT abdomen and pelvis with contrast without acute findings.  Reports that she has such presentation approximately 3-4 times a year.  DD: Gastritis/esophagitis, PUD versus other etiologies, ? Fibroids.  EGD 2013 had shown healing esophagitis.  Tried clear liquids this morning but patient promptly vomited.  Placed on scheduled IV Zofran, twice daily PPI, PRN IV Phenergan, continued IV hydration and monitor closely.  Improving.  Gradually advance diet as tolerated.  Essential hypertension: Reasonably controlled.  Does not appear to have been on oral medications PTA.  It appears that she was on clonidine patch 0.1 mg weekly.  Discontinue patch and may consider starting oral antihypertensives, possibly amlodipine if needed.  Hypokalemia: Secondary to  poor oral intake and GI losses.  Continue to replace IV and follow BMP in a.m.  Acute kidney injury: Creatinine 1.38 on 11/25.  Secondary to dehydration.  Resolved.  Asthma: Stable without clinical bronchospasm.  Bipolar/schizoaffective disorder: Stable but has been unable to take her oral medications for several days.  Patient states that she follows with Dr. Tinnie Gens, psychiatry.  Resume home medications (amitriptyline and Latuda).  Fibroid uterus/DUB: Patient reports that she follows with [facility], supposed to have some kind of biopsy on 12/11 followed by hysterectomy in January of next year.  Hemoglobin stable.  Reports that she is currently having menstrual bleeding.  Constipation: Last BM approximately 8 days PTA.  Had small BM after Dulcolax suppository on 11/27.  Dysuria: Urine microscopy not impressive for UTI.  Hold off treating with antibiotics.  IV fluids.  Morbid obesity/Body mass index is 39.9 kg/m.   DVT prophylaxis: SCDs Code Status: Full Family Communication: None at bedside Disposition: DC home pending clinical improvement, hopefully 11/29.   Consultants:  None  Procedures:  None  Antimicrobials:  None   Subjective: Feels much better.  No emesis overnight.  Tolerated crackers and soda's.  Wishes to advance diet.  Abdominal pain decreased.  Had BM yesterday after Dulcolax suppository.  ROS: As above, otherwise negative.  Objective:  Vitals:   12/05/17 0427 12/05/17 1336 12/05/17 2151 12/06/17 0624  BP:  (!) 156/94 137/87 120/71  Pulse:  79 92 (!) 105  Resp:  18 18 16   Temp:  99.1 F (37.3 C) 98.4 F (36.9 C) 99.2 F (37.3 C)  TempSrc:  Oral Oral Oral  SpO2:   98% 98%  Weight: 108.8 kg     Height: 5\' 5"  (1.651 m)       Examination:  General exam: Pleasant young female, moderately built and morbidly obese lying propped up in bed.  Oral mucosa moist Respiratory system: Clear to auscultation. Respiratory effort normal. Cardiovascular  system: S1 & S2 heard, RRR. No JVD, murmurs, rubs, gallops or clicks. No pedal edema. Gastrointestinal system: Abdomen is nondistended, soft and nontender.  No organomegaly or masses felt. Normal bowel sounds heard. Central nervous system: Alert and oriented. No focal neurological deficits. Extremities: Symmetric 5 x 5 power. Skin: No rashes, lesions or ulcers Psychiatry: Judgement and insight appear normal. Mood & affect appropriate.     Data Reviewed: I have personally reviewed following labs and imaging studies  CBC: Recent Labs  Lab 12/03/17 1119 12/04/17 1450 12/05/17 0601  WBC 11.5* 10.9* 10.7*  NEUTROABS 8.9*  --  5.2  HGB 12.6 13.8 12.8  HCT 38.6 41.7 39.3  MCV 85.2 84.1 85.4  PLT 332 377 703   Basic Metabolic Panel: Recent Labs  Lab 12/03/17 1119 12/04/17 1450 12/05/17 0601 12/06/17 0410  NA 138 142 142 138  K 3.3* 3.3* 3.3* 3.2*  CL 99 101 101 105  CO2 25 29 31 26   GLUCOSE 137* 124* 126* 107*  BUN 8 12 11 6   CREATININE 1.38* 1.19* 1.04* 0.86  CALCIUM 9.8 9.7 9.3 8.6*  MG  --   --  2.2  --   PHOS  --   --  3.9  --    Liver Function Tests: Recent Labs  Lab 12/03/17 1119 12/04/17 1450  AST 26 30  ALT 21 24  ALKPHOS 72 79  BILITOT 0.7 0.9  PROT 8.3* 8.9*  ALBUMIN 4.4 4.5     Radiology Studies: Ct Abdomen Pelvis W Contrast  Result Date: 12/05/2017 CLINICAL DATA:  Nausea, vomiting.  Abd pain, acute, generalized EXAM: CT ABDOMEN AND PELVIS WITH CONTRAST TECHNIQUE: Multidetector CT imaging of the abdomen and pelvis was performed using the standard protocol following bolus administration of intravenous contrast. CONTRAST:  141mL ISOVUE-300 IOPAMIDOL (ISOVUE-300) INJECTION 61% COMPARISON:  Radiograph yesterday. Multiple prior CT most recently 10/11/2017 FINDINGS: Lower chest: No consolidation or pleural fluid. Previous subpleural nodule in the right middle lobe is not seen, may be excluded from the field of view. Hepatobiliary: No focal hepatic abnormality.  Liver slightly prominent size spanning 20 cm cranial caudal. Gallbladder physiologically distended, no calcified stone. No biliary dilatation. Pancreas: No ductal dilatation or inflammation. Spleen: Normal in size without focal abnormality. Splenule inferiorly. Adrenals/Urinary Tract: Normal adrenal glands. No hydronephrosis or perinephric edema. Homogeneous renal enhancement with symmetric excretion on delayed phase imaging. Urinary bladder is physiologically distended without wall thickening. Stomach/Bowel: Small hiatal hernia. Appendix appears normal. No evidence of bowel wall thickening, distention, or inflammatory changes. Vascular/Lymphatic: No significant vascular findings are present. No enlarged abdominal or pelvic lymph nodes. Reproductive: Enlarged fibroid uterus as before. Tampon in the vagina. No adnexal mass. Other: Small umbilical hernia contains fat and nonobstructed noninflamed small bowel no free air, free fluid, or intra-abdominal fluid collection. Musculoskeletal: There are no acute or suspicious osseous abnormalities. Sclerosis of bilateral sacroiliac joints, right greater than left, unchanged. IMPRESSION: 1. No acute findings in the abdomen/pelvis. 2. Small umbilical hernia contains fat and minimal small bowel, no obstruction or inflammation. 3. Chronic findings of hiatal hernia and enlarged fibroid uterus. Electronically Signed   By: Keith Rake M.D.   On: 12/05/2017 03:17   Dg Abd 2 Views  Result Date: 12/04/2017 CLINICAL DATA:  Abdominal pain. EXAM: ABDOMEN - 2 VIEW COMPARISON:  CT scan of October 11, 2017. Radiographs of October 10, 2017. FINDINGS: The bowel gas pattern is normal. There is no evidence of free air. No radio-opaque calculi or other significant radiographic abnormality is seen. IMPRESSION: No evidence of bowel obstruction or ileus. Electronically Signed   By: Marijo Conception, M.D.   On: 12/04/2017 17:52        Scheduled Meds: . ondansetron (ZOFRAN) IV  4 mg  Intravenous Q6H  . pantoprazole (PROTONIX) IV  40 mg Intravenous Q12H   Continuous Infusions: . 0.9 % NaCl with KCl 20 mEq / L 125 mL/hr at 12/06/17 1106  . potassium chloride 10 mEq (12/06/17 1100)     LOS: 1 day     Vernell Leep, MD, FACP, [facility]. [facility] Pager [phone]  If 7PM-7AM, please contact night-coverage [email or web address] Password [facility] 12/06/2017, 12:36 PM

## 2017-12-07 DIAGNOSIS — I10 Essential (primary) hypertension: Secondary | ICD-10-CM

## 2017-12-07 LAB — BASIC METABOLIC PANEL
ANION GAP: 12 (ref 5–15)
BUN: 5 mg/dL — ABNORMAL LOW (ref 6–20)
CALCIUM: 9.3 mg/dL (ref 8.9–10.3)
CO2: 24 mmol/L (ref 22–32)
Chloride: 99 mmol/L (ref 98–111)
Creatinine, Ser: 0.81 mg/dL (ref 0.44–1.00)
GFR calc non Af Amer: >60 mL/min (ref >60)
Glucose, Bld: 157 mg/dL — ABNORMAL HIGH (ref 70–99)
Potassium: 4 mmol/L (ref 3.5–5.1)
Sodium: 135 mmol/L (ref 135–145)

## 2017-12-07 MED ORDER — AMITRIPTYLINE HCL 25 MG PO TABS: 75.0000 mg | ORAL_TABLET | Freq: Every day | ORAL | Status: DC

## 2017-12-07 MED ORDER — HYDROCHLOROTHIAZIDE 25 MG PO TABS: 25.0000 mg | ORAL_TABLET | Freq: Every day | ORAL | 0 refills | Status: DC

## 2017-12-07 MED ORDER — CETIRIZINE HCL 10 MG PO TABS: 10.0000 mg | ORAL_TABLET | Freq: Every day | ORAL | Status: DC | PRN

## 2017-12-07 NOTE — Discharge Instructions (Signed)

## 2017-12-07 NOTE — Discharge Summary (Signed)
Physician Discharge Summary  Holly Mendez GEZ:[id number] DOB: [birth year]  PCP: Dickie La, MD  Admit date: 12/04/2017 Discharge date: 12/07/2017  Recommendations for Outpatient Follow-up:  1. Dr. [name], PCP in 5 days with repeat labs (BMP). Mc[facility]ee, GI.  Home Health: None Equipment/Devices: None  Discharge Condition: Improved and stable CODE STATUS: Full Diet recommendation: Heart healthy diet.  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  CT abdomen pelvis without acute findings.  Admitted for intractable abdominal pain, nausea and vomiting.  Supportive care.   Improved.   Assessment & Plan:  Intractable abdominal pain, nausea and nonbloody emesis: Etiology not clear.  KUB and CT abdomen and pelvis with contrast without acute findings.  Reports that she has such presentation approximately 3-4 times a year.  DD: Gastritis/esophagitis, PUD versus other etiologies, ? Fibroids.  EGD 2013 had shown healing esophagitis.  She was treated supportively with bowel rest, scheduled IV Zofran, as needed IV Phenergan, IV PPI.  Diet was gradually advanced and she tolerated it well yesterday.  Last night, she had an episode of nausea and nonbloody emesis.  When seen this morning, she had  ongoing nausea and I recommended GI consultation for further evaluation and discussed with Dr. Benson Norway to see her in consultation.  Subsequently however patient stated that she feels much better, insists on being discharged home right away due to some urgent matter that she has to take care off at home, does not wish to stay in the hospital to complete the GI consultation.  She understands the risk of deterioration if her symptoms were to recur or worsen and is aware to seek immediate medical help.  She was advised to follow-up with her PCP and outpatient GI.  Continue prior home Nexium and as needed Zofran.  Essential hypertension:  Reports that she was on clonidine patch in the past prescribed by psychiatry but had not been on it prior to admission.  Reportedly her tongue was "twisted" on amlodipine use in the past.  Blood pressures in the hospital have been intermittently mildly uncontrolled.  Initiated HCTZ 25 mg daily at discharge with close outpatient follow-up with PCP for further management.  Hypokalemia: Secondary to poor oral intake and GI losses.  Resolved.  Now that HCTZ has been initiated, follow BMP closely as outpatient and consider replacement if needed.  Acute kidney injury: Creatinine 1.38 on 11/25.  Secondary to dehydration.  Resolved.  Asthma: Stable without clinical bronchospasm.  Continue prior home medication regimen.  Bipolar/schizoaffective disorder: Stable but had been unable to take her oral medications for several days.  Patient states that she follows with Dr. Tinnie Gens, psychiatry.  Resumed home medications (amitriptyline and Latuda).  Fibroid uterus/DUB: Patient reports that she follows with [facility], supposed to have some kind of biopsy on 12/11 followed by hysterectomy in January of next year.  Hemoglobin stable.  Reports that she is currently having menstrual bleeding.  She is likely on Megace for this indication.  Constipation: Last BM approximately 8  days PTA.  Had small BM after Dulcolax suppository on 11/27 and on 11/28.  Dysuria: Urine microscopy not impressive for UTI.  Hold off treating with antibiotics.  Resolved.  Morbid obesity/Body mass index is 39.9 kg/m.     Consultants:  None  Procedures:  None   Discharge Instructions  Discharge Instructions    Call MD for:  extreme fatigue   Complete by:  As directed    Call MD for:  persistant dizziness or light-headedness   Complete by:  As directed    Call MD for:  persistant nausea and vomiting   Complete by:  As directed    Call MD for:  severe uncontrolled pain   Complete by:  As directed    Diet - low sodium heart healthy   Complete by:  As directed    Increase activity slowly   Complete by:  As directed        Medication List    STOP taking these medications   cloNIDine 0.1 mg/24hr patch Commonly known as:  CATAPRES - Dosed in mg/24 hr   metoCLOPramide 10 MG tablet Commonly known as:  REGLAN   pantoprazole 40 MG tablet Commonly known as:  PROTONIX   promethazine 25 MG suppository Commonly known as:  PHENERGAN     TAKE these medications   albuterol 108 (90 Base) MCG/ACT inhaler Commonly known as:  PROVENTIL HFA;VENTOLIN HFA Inhale 2 puffs into the lungs every 6 (six) hours as needed for wheezing or shortness of breath. (200/8=25)   amitriptyline 25 MG tablet Commonly known as:  ELAVIL Take 3 tablets (75 mg total) by mouth at bedtime.   budesonide-formoterol 80-4.5 MCG/ACT inhaler Commonly known as:  SYMBICORT INHALE TWO PUFFS INTO THE LUNGS TWICE DAILY What changed:    how much to take  how to take this  when to take this  additional instructions   cetirizine 10 MG tablet Commonly known as:  ZYRTEC Take 1 tablet (10 mg total) by mouth daily as needed for allergies.   dicyclomine 20 MG tablet Commonly known as:  BENTYL Take 1 tablet (20 mg total) by mouth 2 (two) times daily.   esomeprazole 40 MG capsule Commonly known as:   NEXIUM Take 40 mg by mouth daily before breakfast. What changed:  See the new instructions.   fluticasone 50 MCG/ACT nasal spray Commonly known as:  FLONASE Place 2 sprays into both nostrils daily as needed for allergies or rhinitis.   hydrochlorothiazide 25 MG tablet Commonly known as:  HYDRODIURIL Take 1 tablet (25 mg total) by mouth daily.   LATUDA 60 MG Tabs Generic drug:  Lurasidone HCl Take 60 mg by mouth daily.   megestrol 40 MG tablet Commonly known as:  MEGACE Take 2 tablets (80 mg total) by mouth 2 (two) times daily.   NARCAN 4 MG/0.1ML Liqd nasal spray kit Generic drug:  naloxone Place 4 mg into the nose as needed (overdose).   ondansetron 4 MG disintegrating tablet Commonly known as:  ZOFRAN-ODT Take 1 tablet (4 mg total) by mouth every 8 (eight) hours as needed for nausea or vomiting.   polyethylene glycol powder powder Commonly known as:  GLYCOLAX/MIRALAX Take one or two packets daily by mouth as directed for constipation What changed:    how much to take  how to take this  when to  take this  reasons to take this  additional instructions      Follow-up Information    Dickie La, MD. Schedule an appointment as soon as possible for a visit in 5 day(s).   Specialties:  Family Medicine, Sports Medicine Why:  To be seen with repeat labs (BMP). Contact information: [address] [place] [address] [phone]        Carol Ada, MD. Schedule an appointment as soon as possible for a visit.   Specialty:  Gastroenterology Contact information: [place], [place] [address] [phone]          Allergies  Allergen Reactions  . Naproxen Swelling  . Norvasc [Amlodipine Besylate] Other (See Comments)    Reaction:  Mouth irritation   . Zithromax [Azithromycin] Other (See Comments)    Reaction:  Severe stomach cramping      Procedures/Studies: Ct Abdomen Pelvis W Contrast  Result Date:  12/05/2017 CLINICAL DATA:  Nausea, vomiting.  Abd pain, acute, generalized EXAM: CT ABDOMEN AND PELVIS WITH CONTRAST TECHNIQUE: Multidetector CT imaging of the abdomen and pelvis was performed using the standard protocol following bolus administration of intravenous contrast. CONTRAST:  180m ISOVUE-300 IOPAMIDOL (ISOVUE-300) INJECTION 61% COMPARISON:  Radiograph yesterday. Multiple prior CT most recently 10/11/2017 FINDINGS: Lower chest: No consolidation or pleural fluid. Previous subpleural nodule in the right middle lobe is not seen, may be excluded from the field of view. Hepatobiliary: No focal hepatic abnormality. Liver slightly prominent size spanning 20 cm cranial caudal. Gallbladder physiologically distended, no calcified stone. No biliary dilatation. Pancreas: No ductal dilatation or inflammation. Spleen: Normal in size without focal abnormality. Splenule inferiorly. Adrenals/Urinary Tract: Normal adrenal glands. No hydronephrosis or perinephric edema. Homogeneous renal enhancement with symmetric excretion on delayed phase imaging. Urinary bladder is physiologically distended without wall thickening. Stomach/Bowel: Small hiatal hernia. Appendix appears normal. No evidence of bowel wall thickening, distention, or inflammatory changes. Vascular/Lymphatic: No significant vascular findings are present. No enlarged abdominal or pelvic lymph nodes. Reproductive: Enlarged fibroid uterus as before. Tampon in the vagina. No adnexal mass. Other: Small umbilical hernia contains fat and nonobstructed noninflamed small bowel no free air, free fluid, or intra-abdominal fluid collection. Musculoskeletal: There are no acute or suspicious osseous abnormalities. Sclerosis of bilateral sacroiliac joints, right greater than left, unchanged. IMPRESSION: 1. No acute findings in the abdomen/pelvis. 2. Small umbilical hernia contains fat and minimal small bowel, no obstruction or inflammation. 3. Chronic findings of hiatal  hernia and enlarged fibroid uterus. Electronically Signed   By: MKeith RakeM.D.   On: 12/05/2017 03:17   Dg Abd 2 Views  Result Date: 12/04/2017 CLINICAL DATA:  Abdominal pain. EXAM: ABDOMEN - 2 VIEW COMPARISON:  CT scan of October 11, 2017. Radiographs of October 10, 2017. FINDINGS: The bowel gas pattern is normal. There is no evidence of free air. No radio-opaque calculi or other significant radiographic abnormality is seen. IMPRESSION: No evidence of bowel obstruction or ileus. Electronically Signed   By: JMarijo Conception M.D.   On: 12/04/2017 17:52      Subjective: History as noted above.  Did well with advancing diet yesterday.  However had an episode of nausea and nonbloody emesis overnight.  Was nauseous this morning.  Had BM yesterday.  Also reported some lower abdominal pain this morning.  Recommended GI consultation which subsequently she refused and insisted on being discharged home.  As per RN, ambulating comfortably in the hall.  Discharge Exam:  Vitals:   12/06/17 2122  12/07/17 0532 12/07/17 0649 12/07/17 0657  BP: (!) 149/100 (!) 183/113 (!) 180/112 (!) 146/96  Pulse: 98 87 82   Resp: 12 16    Temp: 98.9 F (37.2 C) 99.2 F (37.3 C)    TempSrc: Oral Oral    SpO2: 98% 100%    Weight:      Height:        General exam: Pleasant young female, moderately built and morbidly obese lying propped up in bed.  Oral mucosa moist Respiratory system: Clear to auscultation. Respiratory effort normal. Cardiovascular system: S1 & S2 heard, RRR. No JVD, murmurs, rubs, gallops or clicks. No pedal edema. Gastrointestinal system: Abdomen is nondistended, soft and nontender.  No organomegaly or masses felt. Normal bowel sounds heard. Central nervous system: Alert and oriented. No focal neurological deficits. Extremities: Symmetric 5 x 5 power. Skin: No rashes, lesions or ulcers Psychiatry: Judgement and insight appear normal. Mood & affect appropriate.     The results of  significant diagnostics from this hospitalization (including imaging, microbiology, ancillary and laboratory) are listed below for reference.     Labs: CBC: Recent Labs  Lab 12/03/17 1119 12/04/17 1450 12/05/17 0601  WBC 11.5* 10.9* 10.7*  NEUTROABS 8.9*  --  5.2  HGB 12.6 13.8 12.8  HCT 38.6 41.7 39.3  MCV 85.2 84.1 85.4  PLT 332 377 956   Basic Metabolic Panel: Recent Labs  Lab 12/03/17 1119 12/04/17 1450 12/05/17 0601 12/06/17 0410 12/07/17 0721  NA 138 142 142 138 135  K 3.3* 3.3* 3.3* 3.2* 4.0  CL 99 101 101 105 99  CO2 '25 29 31 26 24  ' GLUCOSE 137* 124* 126* 107* 157*  BUN '8 12 11 6 ' 5*  CREATININE 1.38* 1.19* 1.04* 0.86 0.81  CALCIUM 9.8 9.7 9.3 8.6* 9.3  MG  --   --  2.2  --   --   PHOS  --   --  3.9  --   --    Liver Function Tests: Recent Labs  Lab 12/03/17 1119 12/04/17 1450  AST 26 30  ALT 21 24  ALKPHOS 72 79  BILITOT 0.7 0.9  PROT 8.3* 8.9*  ALBUMIN 4.4 4.5   Urinalysis    Component Value Date/Time   COLORURINE YELLOW 12/04/2017 1420   APPEARANCEUR HAZY (A) 12/04/2017 1420   LABSPEC 1.016 12/04/2017 1420   PHURINE 7.0 12/04/2017 1420   GLUCOSEU NEGATIVE 12/04/2017 1420   HGBUR MODERATE (A) 12/04/2017 1420   BILIRUBINUR NEGATIVE 12/04/2017 1420   KETONESUR 20 (A) 12/04/2017 1420   PROTEINUR NEGATIVE 12/04/2017 1420   UROBILINOGEN 0.2 09/21/2014 1101   NITRITE NEGATIVE 12/04/2017 1420   LEUKOCYTESUR NEGATIVE 12/04/2017 1420      Time coordinating discharge: 40 minutes  SIGNED:  Vernell Leep, MD, FACP, [facility]. [facility] Pager [phone]  If 7PM-7AM, please contact night-coverage [email or web address] Password [facility] 12/07/2017, 12:15 PM

## 2017-12-07 NOTE — SANE Note (Signed)
Patient had 1 episode of emesis. An anti-emetic and analgesic were administered to the patient

## 2017-12-07 NOTE — Progress Notes (Signed)
Patient given discharge, follow up, and medication instructions, verbalized understanding, IV removed, personal belongings with patient, states she will have a friend transport her home

## 2017-12-09 ENCOUNTER — Encounter (HOSPITAL_COMMUNITY): Payer: Self-pay | Admitting: Emergency Medicine

## 2017-12-09 ENCOUNTER — Emergency Department (HOSPITAL_COMMUNITY)
Admission: EM | Admit: 2017-12-09 | Discharge: 2017-12-10 | Disposition: A | Discharge status: Home / Self Care | Payer: Medicaid Other | Attending: Emergency Medicine | Admitting: Emergency Medicine

## 2017-12-09 ENCOUNTER — Other Ambulatory Visit: Payer: Self-pay

## 2017-12-09 DIAGNOSIS — Z87891 Personal history of nicotine dependence: Secondary | ICD-10-CM | POA: Insufficient documentation

## 2017-12-09 DIAGNOSIS — G8929 Other chronic pain: Secondary | ICD-10-CM | POA: Insufficient documentation

## 2017-12-09 DIAGNOSIS — R112 Nausea with vomiting, unspecified: Secondary | ICD-10-CM

## 2017-12-09 DIAGNOSIS — Z79899 Other long term (current) drug therapy: Secondary | ICD-10-CM | POA: Insufficient documentation

## 2017-12-09 DIAGNOSIS — Z765 Malingerer [conscious simulation]: Secondary | ICD-10-CM | POA: Diagnosis not present

## 2017-12-09 DIAGNOSIS — F1410 Cocaine abuse, uncomplicated: Secondary | ICD-10-CM | POA: Diagnosis not present

## 2017-12-09 DIAGNOSIS — F250 Schizoaffective disorder, bipolar type: Secondary | ICD-10-CM | POA: Diagnosis not present

## 2017-12-09 DIAGNOSIS — R1084 Generalized abdominal pain: Secondary | ICD-10-CM | POA: Diagnosis present

## 2017-12-09 DIAGNOSIS — R109 Unspecified abdominal pain: Secondary | ICD-10-CM

## 2017-12-09 MED ORDER — SODIUM CHLORIDE 0.9 % IV BOLUS (SEPSIS)
1000.0000 mL | Freq: Once | INTRAVENOUS | Status: AC
  Administered 2017-12-10: 1000 mL via INTRAVENOUS

## 2017-12-09 MED ORDER — HALOPERIDOL LACTATE 5 MG/ML IJ SOLN
5.0000 mg | Freq: Once | INTRAMUSCULAR | Status: AC
  Administered 2017-12-10: 5 mg via INTRAVENOUS
  Filled 2017-12-09: qty 1
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
## 2017-12-09 NOTE — ED Triage Notes (Signed)
Per EMS pt from home.  Hx of pancreatitis  And fibroids. Pain, nausea, and vomiting. States today her fibroids are bothering  Asked if she had resolution of her symptoms after her last discharge but pt states she was "never better"  Pt writhing in bed. Yelling, crying, wretching, and inconsolable at this time. Received 4 mg zofran en route which EMS states stopped her active vomiting on the way here.

## 2017-12-10 ENCOUNTER — Encounter (HOSPITAL_COMMUNITY): Payer: Self-pay | Admitting: Emergency Medicine

## 2017-12-10 ENCOUNTER — Emergency Department (HOSPITAL_COMMUNITY): Payer: Medicaid Other

## 2017-12-10 LAB — COMPREHENSIVE METABOLIC PANEL
ALT: 28 U/L (ref 0–44)
AST: 26 U/L (ref 15–41)
Albumin: 4 g/dL (ref 3.5–5.0)
Alkaline Phosphatase: 68 U/L (ref 38–126)
Anion gap: 16 — ABNORMAL HIGH (ref 5–15)
BILIRUBIN TOTAL: 0.7 mg/dL (ref 0.3–1.2)
BUN: 6 mg/dL (ref 6–20)
CALCIUM: 9.2 mg/dL (ref 8.9–10.3)
CO2: 20 mmol/L — ABNORMAL LOW (ref 22–32)
Chloride: 97 mmol/L — ABNORMAL LOW (ref 98–111)
Creatinine, Ser: 1.04 mg/dL — ABNORMAL HIGH (ref 0.44–1.00)
GFR calc Af Amer: >60 mL/min (ref >60)
GFR calc non Af Amer: >60 mL/min (ref >60)
Glucose, Bld: 155 mg/dL — ABNORMAL HIGH (ref 70–99)
Potassium: 3.6 mmol/L (ref 3.5–5.1)
Sodium: 133 mmol/L — ABNORMAL LOW (ref 135–145)
Total Protein: 7.9 g/dL (ref 6.5–8.1)

## 2017-12-10 LAB — WET PREP, GENITAL
Sperm: NONE SEEN
Trich, Wet Prep: NONE SEEN
YEAST WET PREP: NONE SEEN

## 2017-12-10 LAB — CBC
HCT: 41.1 % (ref 36.0–46.0)
Hemoglobin: 13.5 g/dL (ref 12.0–15.0)
MCH: 27.2 pg (ref 26.0–34.0)
MCHC: 32.8 g/dL (ref 30.0–36.0)
MCV: 82.7 fL (ref 80.0–100.0)
NRBC: 0 % (ref 0.0–0.2)
PLATELETS: 319 10*3/uL (ref 150–400)
RBC: 4.97 MIL/uL (ref 3.87–5.11)
RDW: 14.5 % (ref 11.5–15.5)
WBC: 9.9 10*3/uL (ref 4.0–10.5)

## 2017-12-10 LAB — URINALYSIS, ROUTINE W REFLEX MICROSCOPIC
Bilirubin Urine: NEGATIVE
GLUCOSE, UA: NEGATIVE mg/dL
Ketones, ur: 5 mg/dL — AB
Nitrite: NEGATIVE
Protein, ur: 30 mg/dL — AB
Specific Gravity, Urine: 1.016 (ref 1.005–1.030)
pH: 8 (ref 5.0–8.0)

## 2017-12-10 LAB — RAPID URINE DRUG SCREEN, HOSP PERFORMED
Amphetamines: NOT DETECTED
Barbiturates: NOT DETECTED
Benzodiazepines: NOT DETECTED
Cocaine: POSITIVE — AB
Opiates: NOT DETECTED
Tetrahydrocannabinol: NOT DETECTED

## 2017-12-10 LAB — I-STAT BETA HCG BLOOD, ED (MC, WL, AP ONLY): I-stat hCG, quantitative: <5 m[IU]/mL (ref <5)

## 2017-12-10 LAB — LIPASE, BLOOD: Lipase: 102 U/L — ABNORMAL HIGH (ref 11–51)

## 2017-12-10 LAB — GC/CHLAMYDIA PROBE AMP (~~LOC~~) NOT AT ARMC
Chlamydia: NEGATIVE
NEISSERIA GONORRHEA: NEGATIVE

## 2017-12-10 MED ORDER — ONDANSETRON 4 MG PO TBDP: 4.0000 mg | ORAL_TABLET | Freq: Four times a day (QID) | ORAL | 0 refills | Status: DC | PRN

## 2017-12-10 MED ORDER — PROMETHAZINE HCL 25 MG/ML IJ SOLN
25.0000 mg | Freq: Once | INTRAMUSCULAR | Status: AC
  Administered 2017-12-10: 25 mg via INTRAVENOUS
  Filled 2017-12-10: qty 1

## 2017-12-10 NOTE — Discharge Instructions (Addendum)
Your labs and urine today were normal.  You recently had a CT of your abdomen pelvis which showed no acute abnormality.  We performed an ultrasound of your uterus which showed fibroids but no acute change.    Your drug screen was positive for cocaine.  According to the [facility] controlled substance reporting system you received 120 tablets of hydrocodone on October 14.  Due to these findings, we will not prescribing narcotics from the emergency department.  You will need to follow-up with your primary care physicians for further management of your chronic pain.  You have been in the emergency department for 4 hours and we have not witnessed any vomiting.

## 2017-12-17 ENCOUNTER — Other Ambulatory Visit: Payer: Self-pay | Admitting: Family Medicine

## 2017-12-19 ENCOUNTER — Encounter: Payer: Self-pay | Admitting: Obstetrics and Gynecology

## 2017-12-19 ENCOUNTER — Other Ambulatory Visit (HOSPITAL_COMMUNITY)
Admission: RE | Admit: 2017-12-19 | Discharge: 2017-12-19 | Disposition: A | Discharge status: Home / Self Care | Payer: Medicaid Other | Source: Ambulatory Visit | Attending: Obstetrics and Gynecology | Admitting: Obstetrics and Gynecology

## 2017-12-19 ENCOUNTER — Ambulatory Visit (INDEPENDENT_AMBULATORY_CARE_PROVIDER_SITE_OTHER): Payer: Medicaid Other | Admitting: Obstetrics and Gynecology

## 2017-12-19 VITALS — BP 119/76 | HR 110 | Wt 240.4 lb

## 2017-12-19 DIAGNOSIS — N939 Abnormal uterine and vaginal bleeding, unspecified: Secondary | ICD-10-CM | POA: Insufficient documentation

## 2017-12-19 DIAGNOSIS — Z124 Encounter for screening for malignant neoplasm of cervix: Secondary | ICD-10-CM | POA: Insufficient documentation

## 2017-12-19 DIAGNOSIS — Z3202 Encounter for pregnancy test, result negative: Secondary | ICD-10-CM | POA: Diagnosis not present

## 2017-12-19 LAB — POCT PREGNANCY, URINE: Preg Test, Ur: NEGATIVE

## 2017-12-19 NOTE — Progress Notes (Signed)
ENDOMETRIAL BIOPSY      Holly Mendez is a 42 y.o. [id number] here for endometrial biopsy.  The indications for endometrial biopsy were reviewed.  Risks of the biopsy including cramping, bleeding, infection, uterine perforation, inadequate specimen and need for additional procedures were discussed. The patient states she understands and agrees to undergo procedure today. Consent was signed. Time out was performed.   Indications: AUB Urine HCG: negative  A bivalve speculum was placed into the vagina and the cervix was easily visualized. Pap smear performed. The cervix was then prepped with Betadine x2. A single-toothed tenaculum was placed on the anterior lip of the cervix to stabilize it. The 3 mm pipelle was introduced into the endometrial cavity without difficulty to a depth of 8 cm, and a moderate amount of tissue was obtained and sent to pathology. This was repeated for a total of 3 passes. The instruments were removed from the patient's vagina. Minimal bleeding from the cervix at the tenaculum was noted.   The patient tolerated the procedure well. Routine post-procedure instructions were given to the patient.     Feliz Beam, M.D. Attending Center for [employer] manager)

## 2017-12-19 NOTE — Patient Instructions (Signed)
[place]: [phone]

## 2017-12-21 LAB — CYTOLOGY - PAP
Diagnosis: NEGATIVE
HPV (WINDOPATH): NOT DETECTED

## 2017-12-24 ENCOUNTER — Other Ambulatory Visit: Payer: Self-pay | Admitting: Family Medicine

## 2018-01-10 ENCOUNTER — Telehealth: Payer: Self-pay | Admitting: General Practice

## 2018-01-10 NOTE — Telephone Encounter (Signed)
Patient called and left message on nurse voicemail line stating she is having a hysterectomy on 1/14 with Dr Kennon Rounds and hasn't heard anything about when to be there and how to prepare and what to expect. Called patient and discussed the pre-op department would be reaching out to her early next week about an appt and would cover all that information and answer her questions. Patient verbalized understanding & had no questions.
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
## 2018-01-10 NOTE — Patient Instructions (Addendum)
Your procedure is scheduled on: Tuesday, 01/22/18  Enter through the Main Entrance of [facility] at: 7:45 am  Pick up the phone at the desk and dial [phone].  Call this number if you have problems the morning of surgery: [phone].  Remember: Do NOT eat food or Do NOT drink clear liquids (including water) after midnight Monday.  Take these medicines the morning of surgery with a SIP OF WATER: nexium, latuda and zyrtec if needed.  Ok to use symbicort inhaler.  Brush your teeth on the day of surgery.  Bring albuterol inhaler with you on day of surgery.  Stop herbal medications, vitamin supplements, Ibuprofen/NSAIDS at this time.  Ok to use Tylenol.  Do NOT wear jewelry (body piercing), metal hair clips/bobby pins, make-up, or nail polish. Do NOT wear lotions, powders, or perfumes.  You may wear deoderant. Do NOT shave for 48 hours prior to surgery. Do NOT bring valuables to the hospital.  Leave suitcase in car.  After surgery it may be brought to your room.  For patients admitted to the hospital, checkout time is 11:00 AM the day of discharge. Have a responsible adult drive you home and stay with you for 24 hours after your procedure.    Home with Home Aide (Does house hold chores) Virgilio Belling - cell [phone] or Home with Husband Herbie [place] cell [phone].

## 2018-01-11 ENCOUNTER — Other Ambulatory Visit: Payer: Self-pay | Admitting: Family Medicine

## 2018-01-14 ENCOUNTER — Inpatient Hospital Stay (HOSPITAL_COMMUNITY): Admission: RE | Admit: 2018-01-14 | Payer: Medicaid Other | Source: Ambulatory Visit

## 2018-01-16 ENCOUNTER — Encounter (HOSPITAL_COMMUNITY): Payer: Self-pay

## 2018-01-16 ENCOUNTER — Encounter (HOSPITAL_COMMUNITY)
Admission: RE | Admit: 2018-01-16 | Discharge: 2018-01-16 | Disposition: A | Discharge status: Home / Self Care | Payer: Medicaid Other | Source: Ambulatory Visit | Attending: Family Medicine | Admitting: Family Medicine

## 2018-01-16 ENCOUNTER — Other Ambulatory Visit: Payer: Self-pay

## 2018-01-16 DIAGNOSIS — Z01812 Encounter for preprocedural laboratory examination: Secondary | ICD-10-CM | POA: Insufficient documentation

## 2018-01-16 HISTORY — DX: Prediabetes: R73.03

## 2018-01-16 HISTORY — DX: Anxiety disorder, unspecified: F41.9

## 2018-01-16 HISTORY — DX: Major depressive disorder, single episode, unspecified: F32.9

## 2018-01-16 HISTORY — DX: Allergy status to unspecified drugs, medicaments and biological substances: Z88.9

## 2018-01-16 HISTORY — DX: Depression, unspecified: F32.A

## 2018-01-16 LAB — COMPREHENSIVE METABOLIC PANEL
ALT: 28 U/L (ref 0–44)
AST: 23 U/L (ref 15–41)
Albumin: 4 g/dL (ref 3.5–5.0)
Alkaline Phosphatase: 79 U/L (ref 38–126)
Anion gap: 7 (ref 5–15)
BUN: 9 mg/dL (ref 6–20)
CHLORIDE: 105 mmol/L (ref 98–111)
CO2: 26 mmol/L (ref 22–32)
CREATININE: 0.92 mg/dL (ref 0.44–1.00)
Calcium: 9.2 mg/dL (ref 8.9–10.3)
GFR calc Af Amer: >60 mL/min (ref >60)
GFR calc non Af Amer: >60 mL/min (ref >60)
Glucose, Bld: 109 mg/dL — ABNORMAL HIGH (ref 70–99)
Potassium: 3.8 mmol/L (ref 3.5–5.1)
Sodium: 138 mmol/L (ref 135–145)
Total Bilirubin: 0.4 mg/dL (ref 0.3–1.2)
Total Protein: 8.6 g/dL — ABNORMAL HIGH (ref 6.5–8.1)

## 2018-01-16 LAB — CBC
HCT: 39.1 % (ref 36.0–46.0)
Hemoglobin: 13 g/dL (ref 12.0–15.0)
MCH: 28.4 pg (ref 26.0–34.0)
MCHC: 33.2 g/dL (ref 30.0–36.0)
MCV: 85.4 fL (ref 80.0–100.0)
Platelets: 341 10*3/uL (ref 150–400)
RBC: 4.58 MIL/uL (ref 3.87–5.11)
RDW: 14.2 % (ref 11.5–15.5)
WBC: 7.7 10*3/uL (ref 4.0–10.5)
nRBC: 0 % (ref 0.0–0.2)

## 2018-01-16 LAB — ABO/RH: ABO/RH(D): O POS

## 2018-01-17 ENCOUNTER — Emergency Department (HOSPITAL_COMMUNITY)
Admission: EM | Admit: 2018-01-17 | Discharge: 2018-01-18 | Disposition: A | Discharge status: Home / Self Care | Payer: Medicaid Other | Attending: Emergency Medicine | Admitting: Emergency Medicine

## 2018-01-17 ENCOUNTER — Emergency Department (HOSPITAL_COMMUNITY): Payer: Medicaid Other

## 2018-01-17 ENCOUNTER — Other Ambulatory Visit: Payer: Self-pay

## 2018-01-17 ENCOUNTER — Encounter (HOSPITAL_COMMUNITY): Payer: Self-pay | Admitting: Emergency Medicine

## 2018-01-17 DIAGNOSIS — I10 Essential (primary) hypertension: Secondary | ICD-10-CM | POA: Diagnosis not present

## 2018-01-17 DIAGNOSIS — J45909 Unspecified asthma, uncomplicated: Secondary | ICD-10-CM | POA: Insufficient documentation

## 2018-01-17 DIAGNOSIS — Z970 Presence of artificial eye: Secondary | ICD-10-CM | POA: Diagnosis not present

## 2018-01-17 DIAGNOSIS — Z79899 Other long term (current) drug therapy: Secondary | ICD-10-CM | POA: Diagnosis not present

## 2018-01-17 DIAGNOSIS — R451 Restlessness and agitation: Secondary | ICD-10-CM | POA: Diagnosis not present

## 2018-01-17 DIAGNOSIS — R112 Nausea with vomiting, unspecified: Secondary | ICD-10-CM | POA: Diagnosis not present

## 2018-01-17 DIAGNOSIS — Z87891 Personal history of nicotine dependence: Secondary | ICD-10-CM | POA: Diagnosis not present

## 2018-01-17 DIAGNOSIS — R1033 Periumbilical pain: Secondary | ICD-10-CM

## 2018-01-17 DIAGNOSIS — R1013 Epigastric pain: Secondary | ICD-10-CM | POA: Diagnosis not present

## 2018-01-17 LAB — COMPREHENSIVE METABOLIC PANEL
ALT: 25 U/L (ref 0–44)
AST: 30 U/L (ref 15–41)
Albumin: 4.2 g/dL (ref 3.5–5.0)
Alkaline Phosphatase: 77 U/L (ref 38–126)
Anion gap: 14 (ref 5–15)
BUN: 8 mg/dL (ref 6–20)
CHLORIDE: 106 mmol/L (ref 98–111)
CO2: 21 mmol/L — AB (ref 22–32)
Calcium: 9.9 mg/dL (ref 8.9–10.3)
Creatinine, Ser: 1.07 mg/dL — ABNORMAL HIGH (ref 0.44–1.00)
GFR calc Af Amer: >60 mL/min (ref >60)
GFR calc non Af Amer: >60 mL/min (ref >60)
Glucose, Bld: 195 mg/dL — ABNORMAL HIGH (ref 70–99)
Potassium: 3.6 mmol/L (ref 3.5–5.1)
Sodium: 141 mmol/L (ref 135–145)
Total Bilirubin: 0.9 mg/dL (ref 0.3–1.2)
Total Protein: 8.5 g/dL — ABNORMAL HIGH (ref 6.5–8.1)

## 2018-01-17 LAB — CBC
HEMATOCRIT: 40.6 % (ref 36.0–46.0)
HEMOGLOBIN: 13.4 g/dL (ref 12.0–15.0)
MCH: 27.2 pg (ref 26.0–34.0)
MCHC: 33 g/dL (ref 30.0–36.0)
MCV: 82.5 fL (ref 80.0–100.0)
Platelets: 362 10*3/uL (ref 150–400)
RBC: 4.92 MIL/uL (ref 3.87–5.11)
RDW: 13.7 % (ref 11.5–15.5)
WBC: 7 10*3/uL (ref 4.0–10.5)
nRBC: 0 % (ref 0.0–0.2)

## 2018-01-17 LAB — LIPASE, BLOOD: LIPASE: 41 U/L (ref 11–51)

## 2018-01-17 LAB — I-STAT BETA HCG BLOOD, ED (MC, WL, AP ONLY): I-stat hCG, quantitative: <5 m[IU]/mL (ref <5)

## 2018-01-17 MED ORDER — ONDANSETRON HCL 4 MG/2ML IJ SOLN
4.0000 mg | Freq: Once | INTRAMUSCULAR | Status: AC
  Administered 2018-01-17: 4 mg via INTRAVENOUS
  Filled 2018-01-17: qty 2

## 2018-01-17 MED ORDER — HALOPERIDOL LACTATE 5 MG/ML IJ SOLN
2.5000 mg | Freq: Once | INTRAMUSCULAR | Status: AC
  Administered 2018-01-17: 2.5 mg via INTRAVENOUS
  Filled 2018-01-17: qty 1

## 2018-01-17 MED ORDER — FENTANYL CITRATE (PF) 100 MCG/2ML IJ SOLN
25.0000 ug | Freq: Once | INTRAMUSCULAR | Status: AC
  Administered 2018-01-17: 25 ug via INTRAVENOUS
  Filled 2018-01-17: qty 2

## 2018-01-17 MED ORDER — IOHEXOL 300 MG/ML  SOLN
100.0000 mL | Freq: Once | INTRAMUSCULAR | Status: AC | PRN
  Administered 2018-01-17: 100 mL via INTRAVENOUS

## 2018-01-17 MED ORDER — LACTATED RINGERS IV BOLUS
1000.0000 mL | Freq: Once | INTRAVENOUS | Status: AC
  Administered 2018-01-17: 1000 mL via INTRAVENOUS

## 2018-01-17 NOTE — ED Notes (Signed)
ED Provider at bedside. 

## 2018-01-17 NOTE — ED Triage Notes (Signed)
Pt called out to EMS for extreme abdomen pain. States she has pancreatitis. EMS states pt had "bucket of vomit" next to her. Pt scheduled for hysterectomy this month. Pt being transferred to chair and pt lowered herself to the floor, lying on the floor. Pt able to stand and assist to chair. 97.9, 151/92, 98 HR, resp 20. EMS states pt ambulatory on scene.
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
## 2018-01-17 NOTE — ED Provider Notes (Signed)
Patient taken in sign out from Dr. Tamala Julian. Patient with  has a past medical history of Anxiety, Arthritis, Asthma, Bipolar disorder ([place]), Blind left eye (1980), Chronic lower back pain, Chronic pancreatitis ([place]), Depression, Drug-seeking behavior, Fibroids (06/19/2017), Fibromyalgia, GERD (gastroesophageal reflux disease), History of seasonal allergies, Hypercholesterolemia, Hypertension, Pre-diabetes, Schizoaffective disorder, and SVD (spontaneous vaginal delivery).  Here with several days of abd pain, n/v. Awaiting CT results.   Patient CT negative for any acute abnormalities.  The patient will be discharged to follow-up with primary care physician.  She is having no active vomiting at this time.   Results for orders placed or performed during the hospital encounter of 01/17/18  Lipase, blood  Result Value Ref Range   Lipase 41 11 - 51 U/L  Comprehensive metabolic panel  Result Value Ref Range   Sodium 141 135 - 145 mmol/L   Potassium 3.6 3.5 - 5.1 mmol/L   Chloride 106 98 - 111 mmol/L   CO2 21 (L) 22 - 32 mmol/L   Glucose, Bld 195 (H) 70 - 99 mg/dL   BUN 8 6 - 20 mg/dL   Creatinine, Ser 1.07 (H) 0.44 - 1.00 mg/dL   Calcium 9.9 8.9 - 10.3 mg/dL   Total Protein 8.5 (H) 6.5 - 8.1 g/dL   Albumin 4.2 3.5 - 5.0 g/dL   AST 30 15 - 41 U/L   ALT 25 0 - 44 U/L   Alkaline Phosphatase 77 38 - 126 U/L   Total Bilirubin 0.9 0.3 - 1.2 mg/dL   GFR calc non Af Amer >60 >60 mL/min   GFR calc Af Amer >60 >60 mL/min   Anion gap 14 5 - 15  CBC  Result Value Ref Range   WBC 7.0 4.0 - 10.5 K/uL   RBC 4.92 3.87 - 5.11 MIL/uL   Hemoglobin 13.4 12.0 - 15.0 g/dL   HCT 40.6 36.0 - 46.0 %   MCV 82.5 80.0 - 100.0 fL   MCH 27.2 26.0 - 34.0 pg   MCHC 33.0 30.0 - 36.0 g/dL   RDW 13.7 11.5 - 15.5 %   Platelets 362 150 - 400 K/uL   nRBC 0.0 0.0 - 0.2 %  I-Stat beta hCG blood, ED  Result Value Ref Range   I-stat hCG, quantitative <5.0 <5 mIU/mL   Comment 3              Margarita Mail,  PA-C 01/18/18 [place], [place], DO 01/18/18 [phone]

## 2018-01-18 LAB — TYPE AND SCREEN
ABO/RH(D): O POS
Antibody Screen: NEGATIVE

## 2018-01-18 MED ORDER — HYOSCYAMINE SULFATE SL 0.125 MG SL SUBL: 0.1250 mg | SUBLINGUAL_TABLET | Freq: Four times a day (QID) | SUBLINGUAL | 0 refills | Status: DC | PRN

## 2018-01-18 MED ORDER — MELOXICAM 15 MG PO TABS: 15.0000 mg | ORAL_TABLET | Freq: Every day | ORAL | 0 refills | Status: DC

## 2018-01-18 MED ORDER — PROMETHAZINE HCL 25 MG PO TABS: 25.0000 mg | ORAL_TABLET | Freq: Four times a day (QID) | ORAL | 0 refills | Status: DC | PRN

## 2018-01-18 NOTE — Discharge Instructions (Addendum)
Your CT was normal Please follow up with your primary care doctor in the next 5-7 days Abdominal (belly) pain can be caused by many things. Your caregiver performed an examination and possibly ordered blood/urine tests and imaging (CT scan, x-rays, ultrasound). Many cases can be observed and treated at home after initial evaluation in the emergency department. Even though you are being discharged home, abdominal pain can be unpredictable. Therefore, you need a repeated exam if your pain does not resolve, returns, or worsens. Most patients with abdominal pain don't have to be admitted to the hospital or have surgery, but serious problems like appendicitis and gallbladder attacks can start out as nonspecific pain. Many abdominal conditions cannot be diagnosed in one visit, so follow-up evaluations are very important. SEEK IMMEDIATE MEDICAL ATTENTION IF: The pain does not go away or becomes severe.  A temperature above 101 develops.  Repeated vomiting occurs (multiple episodes).  The pain becomes localized to portions of the abdomen. The right side could possibly be appendicitis. In an adult, the left lower portion of the abdomen could be colitis or diverticulitis.  Blood is being passed in stools or vomit (bright red or black tarry stools).  Return also if you develop chest pain, difficulty breathing, dizziness or fainting, or become confused, poorly responsive, or inconsolable (young children).

## 2018-01-18 NOTE — ED Notes (Signed)
Pt discharged from ED; instructions provided and scripts given; Pt encouraged to return to ED if symptoms worsen and to f/u with PCP; Pt verbalized understanding of all instructions 

## 2018-01-22 ENCOUNTER — Encounter (HOSPITAL_COMMUNITY): Payer: Self-pay | Admitting: Certified Registered Nurse Anesthetist

## 2018-01-22 ENCOUNTER — Ambulatory Visit (HOSPITAL_COMMUNITY): Admission: RE | Admit: 2018-01-22 | Payer: Medicaid Other | Source: Home / Self Care | Admitting: Family Medicine

## 2018-01-22 SURGERY — HYSTERECTOMY, VAGINAL
Anesthesia: Choice | Laterality: Bilateral

## 2018-01-22 MED ORDER — DEXAMETHASONE SODIUM PHOSPHATE 4 MG/ML IJ SOLN
INTRAMUSCULAR | Status: AC
  Filled 2018-01-22: qty 1

## 2018-01-22 MED ORDER — MIDAZOLAM HCL 2 MG/2ML IJ SOLN
INTRAMUSCULAR | Status: AC
  Filled 2018-01-22: qty 2

## 2018-01-22 MED ORDER — ONDANSETRON HCL 4 MG/2ML IJ SOLN
INTRAMUSCULAR | Status: AC
  Filled 2018-01-22: qty 2

## 2018-01-22 MED ORDER — FENTANYL CITRATE (PF) 100 MCG/2ML IJ SOLN
INTRAMUSCULAR | Status: AC
  Filled 2018-01-22: qty 2

## 2018-01-22 MED ORDER — KETOROLAC TROMETHAMINE 30 MG/ML IJ SOLN
INTRAMUSCULAR | Status: AC
  Filled 2018-01-22: qty 1

## 2018-01-22 MED ORDER — PROPOFOL 10 MG/ML IV BOLUS
INTRAVENOUS | Status: AC
  Filled 2018-01-22: qty 20

## 2018-01-23 ENCOUNTER — Encounter (HOSPITAL_COMMUNITY): Payer: Self-pay

## 2018-01-28 ENCOUNTER — Other Ambulatory Visit: Payer: Self-pay | Admitting: Family Medicine

## 2018-02-05 ENCOUNTER — Other Ambulatory Visit: Payer: Self-pay

## 2018-02-05 ENCOUNTER — Telehealth: Payer: Self-pay | Admitting: General Practice

## 2018-02-05 ENCOUNTER — Emergency Department (HOSPITAL_COMMUNITY)
Admission: EM | Admit: 2018-02-05 | Discharge: 2018-02-06 | Disposition: A | Discharge status: Home / Self Care | Payer: Medicaid Other | Attending: Emergency Medicine | Admitting: Emergency Medicine

## 2018-02-05 ENCOUNTER — Other Ambulatory Visit: Payer: Self-pay | Admitting: Family Medicine

## 2018-02-05 DIAGNOSIS — D250 Submucous leiomyoma of uterus: Secondary | ICD-10-CM

## 2018-02-05 DIAGNOSIS — R112 Nausea with vomiting, unspecified: Secondary | ICD-10-CM

## 2018-02-05 DIAGNOSIS — Z79899 Other long term (current) drug therapy: Secondary | ICD-10-CM | POA: Insufficient documentation

## 2018-02-05 DIAGNOSIS — I10 Essential (primary) hypertension: Secondary | ICD-10-CM | POA: Diagnosis not present

## 2018-02-05 DIAGNOSIS — F1410 Cocaine abuse, uncomplicated: Secondary | ICD-10-CM | POA: Diagnosis not present

## 2018-02-05 DIAGNOSIS — Z87891 Personal history of nicotine dependence: Secondary | ICD-10-CM | POA: Diagnosis not present

## 2018-02-05 DIAGNOSIS — N920 Excessive and frequent menstruation with regular cycle: Secondary | ICD-10-CM

## 2018-02-05 DIAGNOSIS — J45909 Unspecified asthma, uncomplicated: Secondary | ICD-10-CM | POA: Diagnosis not present

## 2018-02-05 DIAGNOSIS — G8929 Other chronic pain: Secondary | ICD-10-CM | POA: Insufficient documentation

## 2018-02-05 DIAGNOSIS — R109 Unspecified abdominal pain: Secondary | ICD-10-CM

## 2018-02-05 DIAGNOSIS — R1013 Epigastric pain: Secondary | ICD-10-CM | POA: Diagnosis present

## 2018-02-05 LAB — COMPREHENSIVE METABOLIC PANEL
ALK PHOS: 67 U/L (ref 38–126)
ALT: 27 U/L (ref 0–44)
ANION GAP: 13 (ref 5–15)
AST: 31 U/L (ref 15–41)
Albumin: 4.2 g/dL (ref 3.5–5.0)
BILIRUBIN TOTAL: 0.6 mg/dL (ref 0.3–1.2)
BUN: <5 mg/dL — ABNORMAL LOW (ref 6–20)
CALCIUM: 9.9 mg/dL (ref 8.9–10.3)
CO2: 22 mmol/L (ref 22–32)
Chloride: 103 mmol/L (ref 98–111)
Creatinine, Ser: 1.06 mg/dL — ABNORMAL HIGH (ref 0.44–1.00)
GFR calc Af Amer: >60 mL/min (ref >60)
GFR calc non Af Amer: >60 mL/min (ref >60)
Glucose, Bld: 151 mg/dL — ABNORMAL HIGH (ref 70–99)
POTASSIUM: 3.3 mmol/L — AB (ref 3.5–5.1)
Sodium: 138 mmol/L (ref 135–145)
TOTAL PROTEIN: 8.1 g/dL (ref 6.5–8.1)

## 2018-02-05 LAB — CBC
HCT: 41.1 % (ref 36.0–46.0)
HEMOGLOBIN: 13.8 g/dL (ref 12.0–15.0)
MCH: 27.8 pg (ref 26.0–34.0)
MCHC: 33.6 g/dL (ref 30.0–36.0)
MCV: 82.9 fL (ref 80.0–100.0)
Platelets: 320 10*3/uL (ref 150–400)
RBC: 4.96 MIL/uL (ref 3.87–5.11)
RDW: 13.6 % (ref 11.5–15.5)
WBC: 7.5 10*3/uL (ref 4.0–10.5)
nRBC: 0 % (ref 0.0–0.2)

## 2018-02-05 LAB — I-STAT BETA HCG BLOOD, ED (MC, WL, AP ONLY): I-stat hCG, quantitative: <5 m[IU]/mL (ref <5)

## 2018-02-05 LAB — LIPASE, BLOOD: Lipase: 40 U/L (ref 11–51)

## 2018-02-05 MED ORDER — HALOPERIDOL LACTATE 5 MG/ML IJ SOLN
2.0000 mg | Freq: Once | INTRAMUSCULAR | Status: AC
  Administered 2018-02-06: 2 mg via INTRAVENOUS
  Filled 2018-02-05: qty 1

## 2018-02-05 MED ORDER — DICYCLOMINE HCL 10 MG/ML IM SOLN
20.0000 mg | Freq: Once | INTRAMUSCULAR | Status: AC
  Administered 2018-02-05: 20 mg via INTRAMUSCULAR
  Filled 2018-02-05: qty 2

## 2018-02-05 MED ORDER — SODIUM CHLORIDE 0.9 % IV BOLUS
2000.0000 mL | Freq: Once | INTRAVENOUS | Status: AC
  Administered 2018-02-05: 2000 mL via INTRAVENOUS

## 2018-02-05 MED ORDER — SODIUM CHLORIDE 0.9% FLUSH
3.0000 mL | Freq: Once | INTRAVENOUS | Status: AC
  Administered 2018-02-05: 3 mL via INTRAVENOUS

## 2018-02-05 MED ORDER — PROMETHAZINE HCL 25 MG/ML IJ SOLN
12.5000 mg | Freq: Once | INTRAMUSCULAR | Status: AC
  Administered 2018-02-05: 12.5 mg via INTRAVENOUS
  Filled 2018-02-05: qty 1

## 2018-02-05 MED ORDER — ONDANSETRON HCL 4 MG/2ML IJ SOLN
4.0000 mg | Freq: Once | INTRAMUSCULAR | Status: AC | PRN
  Administered 2018-02-05: 4 mg via INTRAVENOUS
  Filled 2018-02-05: qty 2

## 2018-02-05 MED ORDER — PANTOPRAZOLE SODIUM 40 MG IV SOLR
40.0000 mg | Freq: Once | INTRAVENOUS | Status: AC
  Administered 2018-02-05: 40 mg via INTRAVENOUS
  Filled 2018-02-05: qty 40

## 2018-02-05 MED ORDER — FENTANYL CITRATE (PF) 100 MCG/2ML IJ SOLN
50.0000 ug | INTRAMUSCULAR | Status: AC | PRN
  Administered 2018-02-05 (×2): 50 ug via INTRAVENOUS
  Filled 2018-02-05 (×2): qty 2

## 2018-02-05 NOTE — ED Triage Notes (Signed)
Patient arrived with abdominal pain that is recurrent. Has hx of pancreatitis and states that she has been here 2-3 times for same. Has not followed up with a GI dr yet. Actively vomiting in triage. 20 ga placed by EMS, 100 mcg Fentanyl given as well as zofran.

## 2018-02-05 NOTE — ED Notes (Signed)
Patient vomited x 2.

## 2018-02-05 NOTE — Telephone Encounter (Signed)
Patient needs to come into the office to sign her hysterectomy statement form. Called patient, no answer- left message stating we are trying to reach you to inform you we need you to come into the office to sign a form anytime during office hours. You may call us back if you have questions.
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
## 2018-02-06 LAB — URINALYSIS, ROUTINE W REFLEX MICROSCOPIC
Bilirubin Urine: NEGATIVE
Glucose, UA: NEGATIVE mg/dL
Ketones, ur: 5 mg/dL — AB
Leukocytes, UA: NEGATIVE
Nitrite: NEGATIVE
Protein, ur: NEGATIVE mg/dL
Specific Gravity, Urine: 1.009 (ref 1.005–1.030)
pH: 8 (ref 5.0–8.0)

## 2018-02-06 LAB — RAPID URINE DRUG SCREEN, HOSP PERFORMED
AMPHETAMINES: NOT DETECTED
BARBITURATES: NOT DETECTED
Benzodiazepines: NOT DETECTED
Cocaine: POSITIVE — AB
Opiates: NOT DETECTED
Tetrahydrocannabinol: NOT DETECTED

## 2018-02-06 MED ORDER — PROMETHAZINE HCL 25 MG RE SUPP: 25.0000 mg | Freq: Four times a day (QID) | RECTAL | 0 refills | Status: DC | PRN

## 2018-02-06 MED ORDER — LIDOCAINE VISCOUS HCL 2 % MT SOLN
15.0000 mL | Freq: Once | OROMUCOSAL | Status: AC
  Administered 2018-02-06: 15 mL via ORAL
  Filled 2018-02-06: qty 15

## 2018-02-06 MED ORDER — ALUM & MAG HYDROXIDE-SIMETH 200-200-20 MG/5ML PO SUSP
30.0000 mL | Freq: Once | ORAL | Status: AC
  Administered 2018-02-06: 30 mL via ORAL
  Filled 2018-02-06: qty 30

## 2018-02-06 NOTE — ED Notes (Signed)
Patient reminded of need for urine specimen. 

## 2018-02-06 NOTE — Discharge Instructions (Signed)
Use Phenergan suppositories if you are unable to tolerate oral nausea medication.  Drink plenty of clear liquids ot prevent dehydration. Continue your daily prescribed medicines and discontinue use of cocaine as this may contribute to abdominal spasms and pain. Follow-up with your primary care doctor.

## 2018-02-06 NOTE — ED Notes (Signed)
Patient verbalizes understanding of discharge instructions. Opportunity for questioning and answers were provided. Armband removed by staff, pt discharged from ED ambulatory.   

## 2018-02-06 NOTE — ED Notes (Signed)
Patient ambulated to the bathroom with minimal assistance. Steady gait noted.

## 2018-02-06 NOTE — ED Notes (Signed)
Patient given PO fluids, tolerating at this time.

## 2018-02-07 ENCOUNTER — Telehealth: Payer: Self-pay | Admitting: Family Medicine

## 2018-02-07 ENCOUNTER — Encounter: Payer: Self-pay | Admitting: *Deleted

## 2018-02-07 NOTE — Telephone Encounter (Signed)
I called Holly Mendez and left another message that she needs to come in to sign a form during office as soon as possible- call us if you have questions. Will send letter.

## 2018-02-07 NOTE — Telephone Encounter (Signed)
Pt is requesting  Dr. Nori Riis to call her back on her medication. Please call the Pt    Back [phone] ad

## 2018-02-08 NOTE — Telephone Encounter (Signed)
Dear [name] Team Can you call her and see what the question is? THANKS! [name]

## 2018-02-11 NOTE — Telephone Encounter (Signed)
Contacted pt to see what she was wanting Dr. Nori Riis to call her about and she said it was not for her it was for her spouse. Katharina Caper, April D, [place]

## 2018-02-13 ENCOUNTER — Other Ambulatory Visit: Payer: Self-pay | Admitting: Family Medicine

## 2018-02-13 ENCOUNTER — Other Ambulatory Visit: Payer: Self-pay | Admitting: Obstetrics & Gynecology

## 2018-02-13 DIAGNOSIS — N920 Excessive and frequent menstruation with regular cycle: Secondary | ICD-10-CM

## 2018-02-13 DIAGNOSIS — D250 Submucous leiomyoma of uterus: Secondary | ICD-10-CM

## 2018-02-14 MED ORDER — ESOMEPRAZOLE MAGNESIUM 40 MG PO CPDR: 40.0000 mg | DELAYED_RELEASE_CAPSULE | Freq: Every day | ORAL | 0 refills | Status: DC

## 2018-02-14 NOTE — Telephone Encounter (Signed)
Called in verbally. Fleeger, Salome Spotted, CMA

## 2018-02-14 NOTE — Telephone Encounter (Signed)
First transmission failed.  Resent.  Fleeger, Salome Spotted, CMA

## 2018-02-14 NOTE — Addendum Note (Signed)
Addended by: Esau Grew on: 02/14/2018 01:22 PM   Modules accepted: Orders

## 2018-02-20 ENCOUNTER — Ambulatory Visit: Payer: Medicaid Other | Admitting: Family Medicine

## 2018-02-21 ENCOUNTER — Telehealth: Payer: Self-pay | Admitting: *Deleted

## 2018-02-21 NOTE — Telephone Encounter (Signed)
Holly Mendez called front desk and call transferred to front desk stating she was called about a form she needed to sign asap before surgery and that she hasn't been able to come because she was in the hospital.. per chart review needs to sign hysterectomy statement . I informed her she needs to sign hysterectomy statement and she can sign with registrars during office hours which I reviewed with her . She voices understanding.
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
## 2018-02-22 NOTE — Pre-Procedure Instructions (Signed)
Holly Mendez  02/22/2018      [place], [place] - [address] [place] [address] Phone: [phone] Fax: [place], [place], [place]  [address] Phone: [phone] Fax: [phone]    Your procedure is scheduled on Tues., Feb. 25, 2020 from 1:15PM-2:37PM  Report to [facility] Entrance "A" at 11:15AM  Call this number if you have problems the morning of surgery:  [phone]   Remember:  Do not eat after midnight on Feb. 24th  You may drink clear liquids until (4:30AM) prior to surgery .  Clear liquids allowed are:  Water, Juice (non-citric and without pulp), Carbonated beverages, Clear Tea, Black Coffee only, Plain Jell-O only, Gatorade and Plain Popsicles only    Take these medicines the morning of surgery with A SIP OF WATER: Cetirizine (ZYRTEC), Esomeprazole (NEXIUM), LATUDA, Megestrol (MEGACE), and Budesonide-formoterol (SYMBICORT)- bring with you the day of surgery   If needed: MetoCLOPramide (REGLAN) and Albuterol (PROAIR HFA)-bring with you the day of surgery  As of today, stop taking all Other Aspirin Products, Vitamins, Fish oils, and Herbal medications. Also stop all NSAIDS i.e. Advil, Ibuprofen, Motrin, Aleve, Anaprox, Naproxen, BC, Goody Powders, and all Supplements. Including: Meloxicam (MOBIC)   Do not wear jewelry, make-up or nail polish.  Do not wear lotions, powders, or perfumes, or deodorant.  Do not shave 48 hours prior to surgery.    Do not bring valuables to the hospital.  [facility] is not responsible for any belongings or valuables.  Contacts, dentures or bridgework may not be worn into surgery.  Leave your suitcase in the car.  After surgery it may be brought to your room.  For patients admitted to the hospital, discharge time will be determined by your treatment  team.  Patients discharged the day of surgery will not be allowed to drive home.   Special instructions: [place]- Preparing For Surgery  Before surgery, you can play an important role. Because skin is not sterile, your skin needs to be as free of germs as possible. You can reduce the number of germs on your skin by washing with CHG (chlorahexidine gluconate) Soap before surgery.  CHG is an antiseptic cleaner which kills germs and bonds with the skin to continue killing germs even after washing.    Oral Hygiene is also important to reduce your risk of infection.  Remember - BRUSH YOUR TEETH THE MORNING OF SURGERY WITH YOUR REGULAR TOOTHPASTE  Please do not use if you have an allergy to CHG or antibacterial soaps. If your skin becomes reddened/irritated stop using the CHG.  Do not shave (including legs and underarms) for at least 48 hours prior to first CHG shower. It is OK to shave your face.  Please follow these instructions carefully.   1. Shower the NIGHT BEFORE SURGERY and the MORNING OF SURGERY with CHG.   2. If you chose to wash your hair, wash your hair first as usual with your normal shampoo.  3. After you shampoo, rinse your hair and body thoroughly to remove the shampoo.  4. Use CHG as you would any other liquid soap. You can apply CHG directly to the skin and wash gently with a scrungie or a clean washcloth.   5. Apply the CHG Soap to your body ONLY FROM THE NECK DOWN.  Do not use on open  wounds or open sores. Avoid contact with your eyes, ears, mouth and genitals (private parts). Wash Face and genitals (private parts)  with your normal soap.  6. Wash thoroughly, paying special attention to the area where your surgery will be performed.  7. Thoroughly rinse your body with warm water from the neck down.  8. DO NOT shower/wash with your normal soap after using and rinsing off the CHG Soap.  9. Pat yourself dry with a CLEAN TOWEL.  10. Wear CLEAN PAJAMAS to bed the night  before surgery, wear comfortable clothes the morning of surgery  11. Place CLEAN SHEETS on your bed the night of your first shower and DO NOT SLEEP WITH PETS.  Day of Surgery:  Do not apply any deodorants/lotions.  Please wear clean clothes to the hospital/surgery center.   Remember to brush your teeth WITH YOUR REGULAR TOOTHPASTE.  Please read over the following fact sheets that you were given. Pain Booklet, Coughing and Deep Breathing, MRSA Information and Surgical Site Infection Prevention

## 2018-02-25 ENCOUNTER — Encounter (HOSPITAL_COMMUNITY)
Admission: RE | Admit: 2018-02-25 | Discharge: 2018-02-25 | Disposition: A | Discharge status: Home / Self Care | Payer: Medicaid Other | Source: Ambulatory Visit | Attending: Family Medicine | Admitting: Family Medicine

## 2018-02-25 ENCOUNTER — Other Ambulatory Visit: Payer: Self-pay | Admitting: Family Medicine

## 2018-02-25 ENCOUNTER — Telehealth: Payer: Self-pay | Admitting: General Practice

## 2018-02-25 ENCOUNTER — Ambulatory Visit (HOSPITAL_COMMUNITY): Payer: Medicaid Other | Admitting: Physician Assistant

## 2018-02-25 ENCOUNTER — Encounter (HOSPITAL_COMMUNITY): Payer: Self-pay

## 2018-02-25 ENCOUNTER — Ambulatory Visit (HOSPITAL_COMMUNITY): Payer: Medicaid Other | Admitting: Certified Registered Nurse Anesthetist

## 2018-02-25 DIAGNOSIS — R7303 Prediabetes: Secondary | ICD-10-CM | POA: Insufficient documentation

## 2018-02-25 DIAGNOSIS — D259 Leiomyoma of uterus, unspecified: Secondary | ICD-10-CM | POA: Diagnosis not present

## 2018-02-25 DIAGNOSIS — K219 Gastro-esophageal reflux disease without esophagitis: Secondary | ICD-10-CM | POA: Insufficient documentation

## 2018-02-25 DIAGNOSIS — I10 Essential (primary) hypertension: Secondary | ICD-10-CM | POA: Diagnosis not present

## 2018-02-25 DIAGNOSIS — J45909 Unspecified asthma, uncomplicated: Secondary | ICD-10-CM | POA: Insufficient documentation

## 2018-02-25 DIAGNOSIS — Z01812 Encounter for preprocedural laboratory examination: Secondary | ICD-10-CM | POA: Insufficient documentation

## 2018-02-25 DIAGNOSIS — Z79899 Other long term (current) drug therapy: Secondary | ICD-10-CM | POA: Diagnosis not present

## 2018-02-25 DIAGNOSIS — K439 Ventral hernia without obstruction or gangrene: Secondary | ICD-10-CM | POA: Diagnosis not present

## 2018-02-25 DIAGNOSIS — M479 Spondylosis, unspecified: Secondary | ICD-10-CM | POA: Diagnosis not present

## 2018-02-25 DIAGNOSIS — N920 Excessive and frequent menstruation with regular cycle: Secondary | ICD-10-CM | POA: Insufficient documentation

## 2018-02-25 DIAGNOSIS — E7800 Pure hypercholesterolemia, unspecified: Secondary | ICD-10-CM | POA: Diagnosis not present

## 2018-02-25 DIAGNOSIS — M797 Fibromyalgia: Secondary | ICD-10-CM | POA: Insufficient documentation

## 2018-02-25 DIAGNOSIS — H5462 Unqualified visual loss, left eye, normal vision right eye: Secondary | ICD-10-CM | POA: Insufficient documentation

## 2018-02-25 DIAGNOSIS — K861 Other chronic pancreatitis: Secondary | ICD-10-CM | POA: Diagnosis not present

## 2018-02-25 DIAGNOSIS — F319 Bipolar disorder, unspecified: Secondary | ICD-10-CM | POA: Insufficient documentation

## 2018-02-25 DIAGNOSIS — Z791 Long term (current) use of non-steroidal anti-inflammatories (NSAID): Secondary | ICD-10-CM | POA: Diagnosis not present

## 2018-02-25 DIAGNOSIS — K449 Diaphragmatic hernia without obstruction or gangrene: Secondary | ICD-10-CM | POA: Insufficient documentation

## 2018-02-25 LAB — CBC
HCT: 43.3 % (ref 36.0–46.0)
Hemoglobin: 14 g/dL (ref 12.0–15.0)
MCH: 27.1 pg (ref 26.0–34.0)
MCHC: 32.3 g/dL (ref 30.0–36.0)
MCV: 83.8 fL (ref 80.0–100.0)
Platelets: 341 10*3/uL (ref 150–400)
RBC: 5.17 MIL/uL — AB (ref 3.87–5.11)
RDW: 14.2 % (ref 11.5–15.5)
WBC: 7.8 10*3/uL (ref 4.0–10.5)
nRBC: 0 % (ref 0.0–0.2)

## 2018-02-25 LAB — COMPREHENSIVE METABOLIC PANEL
ALT: 60 U/L — ABNORMAL HIGH (ref 0–44)
AST: 53 U/L — ABNORMAL HIGH (ref 15–41)
Albumin: 3.8 g/dL (ref 3.5–5.0)
Alkaline Phosphatase: 61 U/L (ref 38–126)
Anion gap: 12 (ref 5–15)
BUN: <5 mg/dL — ABNORMAL LOW (ref 6–20)
CO2: 22 mmol/L (ref 22–32)
CREATININE: 1.15 mg/dL — AB (ref 0.44–1.00)
Calcium: 9.4 mg/dL (ref 8.9–10.3)
Chloride: 103 mmol/L (ref 98–111)
GFR calc Af Amer: >60 mL/min (ref >60)
GFR calc non Af Amer: 58 mL/min — ABNORMAL LOW (ref >60)
Glucose, Bld: 161 mg/dL — ABNORMAL HIGH (ref 70–99)
Potassium: 3.1 mmol/L — ABNORMAL LOW (ref 3.5–5.1)
Sodium: 137 mmol/L (ref 135–145)
Total Bilirubin: 0.6 mg/dL (ref 0.3–1.2)
Total Protein: 7.2 g/dL (ref 6.5–8.1)

## 2018-02-25 LAB — TYPE AND SCREEN
ABO/RH(D): O POS
Antibody Screen: NEGATIVE

## 2018-02-25 LAB — ABO/RH: ABO/RH(D): O POS

## 2018-02-25 NOTE — Progress Notes (Signed)
PCP - [name]  Chest x-ray - 10/11/17 (1 view) EKG - 12/03/17  DM - denies SA - denies  Pt stated she was in the [facility] ED the night before (02-24-18) for a flare up of pancreatitis.  Pt stated she had lab work drawn. At time of PAT appointment, pt's chart does not show any lab results, nor does it have any notes from her ED visit.  Contacted Jeneen Rinks and made him aware.  Pulse rate at initial PAT check in was 116, BP 99/70.  James asked to get a repeat on vitals before pt leaves.  Repeat pulse rate was 102, Bp 101/66.  [place] notified.  Does not need to see patient.  Anesthesia review: yes, history of hypertension  Patient denies shortness of breath, fever, cough and chest pain at PAT appointment   Patient verbalized understanding of instructions that were given to them at the PAT appointment. Patient was also instructed that they will need to review over the PAT instructions again at home before surgery.

## 2018-02-25 NOTE — Telephone Encounter (Signed)
Patient called into front office stating she hasn't been able to come in to sign her hysterectomy paper because she has been in and out of the hospital. She wants to know if this has to be done before her surgery. Told patient that form has to be signed before then. Patient states she will come Thursday with Medicaid transportation. Patient had no questions.
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
## 2018-02-26 NOTE — Anesthesia Preprocedure Evaluation (Deleted)
Anesthesia Evaluation  Patient identified by MRN, date of birth, ID band Patient awake    Reviewed: Allergy & Precautions, NPO status , Patient's Chart, lab work & pertinent test results  Airway Mallampati: II  TM Distance: >3 FB Neck ROM: Full    Dental no notable dental hx.    Pulmonary asthma , former smoker,    Pulmonary exam normal breath sounds clear to auscultation       Cardiovascular hypertension, Pt. on medications Normal cardiovascular exam Rhythm:Regular Rate:Normal  ECG: ST, rate 112   Neuro/Psych  Headaches, PSYCHIATRIC DISORDERS Anxiety Depression Bipolar Disorder Schizophrenia Blind left eye Drug-seeking behavior    GI/Hepatic GERD  Medicated,(+)     substance abuse  ,   Endo/Other  negative endocrine ROS  Renal/GU negative Renal ROS     Musculoskeletal  (+) Arthritis , Fibromyalgia -, narcotic dependentChronic lower back pain   Abdominal (+) + obese,   Peds  Hematology negative hematology ROS (+)   Anesthesia Other Findings Fibroid Uterus Menorrhagia  Reproductive/Obstetrics hcg negative                          Anesthesia Physical Anesthesia Plan  ASA: III  Anesthesia Plan: General   Post-op Pain Management:    Induction: Intravenous  PONV Risk Score and Plan: 4 or greater and Scopolamine patch - Pre-op, Midazolam, Dexamethasone, Ondansetron and Treatment may vary due to age or medical condition  Airway Management Planned: Oral ETT  Additional Equipment:   Intra-op Plan:   Post-operative Plan: Extubation in OR  Informed Consent: I have reviewed the patients History and Physical, chart, labs and discussed the procedure including the risks, benefits and alternatives for the proposed anesthesia with the patient or authorized representative who has indicated his/her understanding and acceptance.     Dental advisory given  Plan Discussed with:  CRNA  Anesthesia Plan Comments: (Reviewed PAT note by Karoline Caldwell, PA-C )       Anesthesia Quick Evaluation

## 2018-03-01 ENCOUNTER — Other Ambulatory Visit: Payer: Self-pay | Admitting: Family Medicine

## 2018-03-04 ENCOUNTER — Encounter (HOSPITAL_COMMUNITY): Payer: Self-pay | Admitting: Certified Registered Nurse Anesthetist

## 2018-03-05 ENCOUNTER — Ambulatory Visit (HOSPITAL_COMMUNITY)
Admission: RE | Admit: 2018-03-05 | Discharge: 2018-03-05 | Disposition: A | Discharge status: Home / Self Care | Payer: Medicaid Other | Source: Ambulatory Visit | Attending: Family Medicine | Admitting: Family Medicine

## 2018-03-05 ENCOUNTER — Other Ambulatory Visit: Payer: Self-pay

## 2018-03-05 ENCOUNTER — Encounter (HOSPITAL_COMMUNITY): Payer: Self-pay | Admitting: Orthopedic Surgery

## 2018-03-05 ENCOUNTER — Encounter (HOSPITAL_COMMUNITY)
Admission: RE | Disposition: A | Discharge status: Home / Self Care | Payer: Self-pay | Source: Ambulatory Visit | Attending: Family Medicine

## 2018-03-05 DIAGNOSIS — M797 Fibromyalgia: Secondary | ICD-10-CM | POA: Insufficient documentation

## 2018-03-05 DIAGNOSIS — N938 Other specified abnormal uterine and vaginal bleeding: Secondary | ICD-10-CM | POA: Diagnosis present

## 2018-03-05 DIAGNOSIS — N920 Excessive and frequent menstruation with regular cycle: Secondary | ICD-10-CM | POA: Insufficient documentation

## 2018-03-05 DIAGNOSIS — J45909 Unspecified asthma, uncomplicated: Secondary | ICD-10-CM | POA: Insufficient documentation

## 2018-03-05 DIAGNOSIS — Z87891 Personal history of nicotine dependence: Secondary | ICD-10-CM | POA: Diagnosis not present

## 2018-03-05 DIAGNOSIS — Z886 Allergy status to analgesic agent status: Secondary | ICD-10-CM | POA: Diagnosis not present

## 2018-03-05 DIAGNOSIS — Z90710 Acquired absence of both cervix and uterus: Secondary | ICD-10-CM | POA: Diagnosis present

## 2018-03-05 DIAGNOSIS — E7800 Pure hypercholesterolemia, unspecified: Secondary | ICD-10-CM | POA: Diagnosis not present

## 2018-03-05 DIAGNOSIS — M1990 Unspecified osteoarthritis, unspecified site: Secondary | ICD-10-CM | POA: Diagnosis not present

## 2018-03-05 DIAGNOSIS — Z5309 Procedure and treatment not carried out because of other contraindication: Secondary | ICD-10-CM | POA: Diagnosis not present

## 2018-03-05 DIAGNOSIS — F319 Bipolar disorder, unspecified: Secondary | ICD-10-CM | POA: Diagnosis not present

## 2018-03-05 DIAGNOSIS — F419 Anxiety disorder, unspecified: Secondary | ICD-10-CM | POA: Diagnosis not present

## 2018-03-05 DIAGNOSIS — K861 Other chronic pancreatitis: Secondary | ICD-10-CM | POA: Insufficient documentation

## 2018-03-05 DIAGNOSIS — K219 Gastro-esophageal reflux disease without esophagitis: Secondary | ICD-10-CM | POA: Insufficient documentation

## 2018-03-05 DIAGNOSIS — I10 Essential (primary) hypertension: Secondary | ICD-10-CM | POA: Insufficient documentation

## 2018-03-05 DIAGNOSIS — D259 Leiomyoma of uterus, unspecified: Secondary | ICD-10-CM | POA: Diagnosis present

## 2018-03-05 DIAGNOSIS — D250 Submucous leiomyoma of uterus: Secondary | ICD-10-CM | POA: Diagnosis not present

## 2018-03-05 DIAGNOSIS — Z881 Allergy status to other antibiotic agents status: Secondary | ICD-10-CM | POA: Insufficient documentation

## 2018-03-05 DIAGNOSIS — H5462 Unqualified visual loss, left eye, normal vision right eye: Secondary | ICD-10-CM | POA: Diagnosis not present

## 2018-03-05 DIAGNOSIS — R7303 Prediabetes: Secondary | ICD-10-CM | POA: Diagnosis not present

## 2018-03-05 LAB — RAPID URINE DRUG SCREEN, HOSP PERFORMED
Amphetamines: NOT DETECTED
Barbiturates: NOT DETECTED
Benzodiazepines: NOT DETECTED
Cocaine: POSITIVE — AB
OPIATES: POSITIVE — AB
Tetrahydrocannabinol: NOT DETECTED

## 2018-03-05 LAB — POCT PREGNANCY, URINE: Preg Test, Ur: NEGATIVE

## 2018-03-05 SURGERY — Surgical Case
Anesthesia: General | Laterality: Bilateral

## 2018-03-05 MED ORDER — ESTRADIOL 0.1 MG/GM VA CREA
TOPICAL_CREAM | VAGINAL | Status: AC
  Filled 2018-03-05: qty 42.5

## 2018-03-05 MED ORDER — FAMOTIDINE 20 MG PO TABS
20.0000 mg | ORAL_TABLET | Freq: Once | ORAL | Status: AC
  Administered 2018-03-05: 20 mg via ORAL
  Filled 2018-03-05: qty 1

## 2018-03-05 MED ORDER — FENTANYL CITRATE (PF) 100 MCG/2ML IJ SOLN
50.0000 ug | INTRAMUSCULAR | Status: AC | PRN
  Administered 2018-03-05 (×2): 50 ug via INTRAVENOUS

## 2018-03-05 MED ORDER — PROPOFOL 10 MG/ML IV BOLUS
INTRAVENOUS | Status: AC
  Filled 2018-03-05: qty 20

## 2018-03-05 MED ORDER — LACTATED RINGERS IV SOLN
INTRAVENOUS | Status: DC
  Administered 2018-03-05: 12:00:00 via INTRAVENOUS

## 2018-03-05 MED ORDER — FAMOTIDINE 20 MG PO TABS
ORAL_TABLET | ORAL | Status: AC
  Filled 2018-03-05: qty 1

## 2018-03-05 MED ORDER — SCOPOLAMINE 1 MG/3DAYS TD PT72
1.0000 | MEDICATED_PATCH | TRANSDERMAL | Status: DC
  Administered 2018-03-05: 1.5 mg via TRANSDERMAL
  Filled 2018-03-05: qty 1

## 2018-03-05 MED ORDER — FENTANYL CITRATE (PF) 250 MCG/5ML IJ SOLN
INTRAMUSCULAR | Status: AC
  Filled 2018-03-05: qty 5

## 2018-03-05 MED ORDER — LIDOCAINE-EPINEPHRINE 1 %-1:100000 IJ SOLN
INTRAMUSCULAR | Status: AC
  Filled 2018-03-05: qty 1

## 2018-03-05 MED ORDER — FENTANYL CITRATE (PF) 100 MCG/2ML IJ SOLN
INTRAMUSCULAR | Status: AC
  Administered 2018-03-05: 50 ug via INTRAVENOUS
  Filled 2018-03-05: qty 2

## 2018-03-05 MED ORDER — ACETAMINOPHEN 500 MG PO TABS
1000.0000 mg | ORAL_TABLET | Freq: Once | ORAL | Status: AC
  Administered 2018-03-05: 1000 mg via ORAL
  Filled 2018-03-05: qty 2

## 2018-03-05 MED ORDER — MIDAZOLAM HCL 2 MG/2ML IJ SOLN
INTRAMUSCULAR | Status: AC
  Filled 2018-03-05: qty 2

## 2018-03-05 MED ORDER — CEFAZOLIN SODIUM-DEXTROSE 2-4 GM/100ML-% IV SOLN
2.0000 g | INTRAVENOUS | Status: DC
  Filled 2018-03-05: qty 100

## 2018-03-05 NOTE — Progress Notes (Signed)
Patient discharged to home with husband. IV was removed, Dr. Kennon Rounds spoke with patient and Dr. Roanna Banning  was ok with patient being discharged. Patient was stable

## 2018-03-05 NOTE — Interval H&P Note (Signed)
History and Physical Interval Note:  03/05/2018 2:36 PM  Holly Mendez  has presented today for surgery, with the diagnosis of Fibroid Uterus Menorrhagia  The various methods of treatment have been discussed with the patient and family.  She appeared somewhat obtunded and she was quite tremulous. She had previously had a positive drug screen in January for Cocaine. A decision was made to repeat her UDS which was positive for cocaine and opiates. After consultation with the patient and anesthesia, we have decided to cancel, elective surgery until she a more appropriate time.   [name]

## 2018-03-05 NOTE — Discharge Instructions (Signed)
Finding Treatment for Addiction Addiction is a complex disease of the brain that causes an uncontrollable (compulsive) need for:  A substance. This includes alcohol, illegal drugs, or prescription medicines, such as painkillers.  An activity or behavior, such as gambling or shopping. Addiction changes the way your brain works. Because of this change:  The need for the medicine, drug, or activity can become so strong that you think about it all the time.  Getting more and more of your addiction becomes the most important thing to you.  You may find yourself leaving other activities and relationships to pursue your addiction.  You can become physically dependent on a substance.  Your health, behavior, emotions, and relationships can change for the worse. How do I know if I need treatment for addiction? Addiction is a progressive disease. Without treatment, addiction can get worse. Living with addiction puts you at higher risk for injury, poor health, loss of employment, loss of money, and even death. You might need treatment for addiction if:  You have tried to stop or cut down, but you have not succeeded.  You find it annoying that your friends and family are concerned about your use or behavior.  You feel guilty about your use or behavior.  You need a particular substance or activity to start your day or to calm down.  You are running out of money because of your addiction.  You have done something illegal to support your addiction.  Your addiction has caused you: ? Health problems. ? Trouble in school, work, home, or with the police. ? To devote all your time to your addiction, and not to other responsibilities. ? To tell lies in order to hide your problem. What types of treatment are available? There may be options for treatment programs and plans based on your addiction, condition, needs, and preferences. No single treatment is right for everyone.  Treatment programs can  be: ? Outpatient. You live at home and go to work or school, but you go to a clinic for treatment. ? Inpatient. You live and sleep at the program facility during treatment.  Programs may include: ? Medicine. You may need medicine to treat the addiction itself, or to treat anxiety or depression. ? Counseling and behavior therapy. This can help individuals and families behave in healthier ways and relate more effectively. ? Support groups. Confidential group therapy, such as a 12-step program, can help individuals and families during treatment and recovery. ? A combination of education, counseling, and a 12-step, spirituality-based approach. What should I consider when selecting a treatment program? Think about your individual requirements when selecting a treatment program. Ask about:  The overall approach to treatment. ? Some programs are strictly 12-step programs. Some have a more flexible approach. ? Programs may differ in length of stay, setting, and size. ? Some programs include your family in your treatment plan. Support may be offered to them throughout the treatment process, as well as instructions for them when you are discharged. ? You may continue to receive support after you have left the program.  The types of medical services that are offered. Find out if the program: ? Offers specific treatment for your particular addiction. ? Meets all of your needs, including physical and cultural needs. ? Includes any medicines you might need. ? Offers mental health counseling as part of your treatment. ? Offers the 12-step meetings at the center, or if transport is available for patients to attend meetings at other locations.  The  cost and types of insurance that are accepted. ? Some programs are sponsored by the government. They support patients who do not have private insurance. ? If you do not have insurance, or if you choose to attend a program that does not accept your insurance,  call the treatment center. Tell them your financial needs and whether a payment plan can be set up. ? There are also organizations that will help you find the resources for treatment. You can find them online by searching "treatment for addiction."  If the program is certified by the appropriate government agency. Where to find support  Your health care provider can help you to find the right treatment. These discussions are confidential.  The [employer] on Alcoholism and Drug Dependence (NCADD). This group has information about treatment centers and programs for people who have an addiction and for family members. ? Call: 1-800-NCA-CALL (445 026 1788). ? Visit the website: https://www.ncadd.org/  The Substance Abuse and Mental Health Services Administration [facility]). This organization will help you find publicly funded treatment centers, help hotlines, and counseling services near you. ? Call: 1-800-662-HELP 915-732-6861). ? Visit the website: www.findtreatment.SamedayNews.com.cy  The National Problem Gambling Helpline. This is a 24-hour confidential helpline for gambling addiction. ? Call: 443-434-5285 ? Visit the website: http://wiggins.com/ In countries outside of the [place] and [place], look in [employer] for contact information for services in your area. Follow these instructions at home:  Find supportive people who will help you stay away from your addiction and stay sober.  Do not use the substance or engage in the activity.  If you have been through treatment: ? Follow your plan. The plan is usually developed by you and your health care provider during treatment. ? Go to meetings with other people in recovery. ? Avoid people, situations, and things that lead you to do the things you are addicted to (triggers). Summary  Addiction changes the way your brain works. These changes cause a desire to repeat and increase the use of the a substance or  behavior.  Addiction is a progressive disease. Without treatment, addiction can get worse. Living with addiction puts you at higher risk for injury, poor health, loss of employment, loss of money, and even death.  There may be options for treatment programs and plans based on your addiction, condition, needs, and preferences. No single treatment is right for everyone.  Your health care provider can help you to find the right treatment. These discussions are confidential. This information is not intended to replace advice given to you by your health care provider. Make sure you discuss any questions you have with your health care provider. Document Released: 11/24/2004 Document Revised: 01/24/2017 Document Reviewed: 01/24/2017 Elsevier Interactive Patient Education  2019 [employer].

## 2018-03-05 NOTE — OR Nursing (Signed)
Pt is tremorous in short stay area. Pt last positive drug (cocaine) screen 02/06/2018. Pt denies use of cocaine in past 24 hr. Dr. Kennon Rounds ordered drug screen. Surgery to be cancelled if positive. Waiting for result

## 2018-03-05 NOTE — Progress Notes (Signed)
Case cancelled d/t positive urine drug screen. Patient received 100 mcgs of Fentanyl at 1254. Per Dr. Roanna Banning, patient is not to be discharged until 2 hours after the last dose which will be at 1500.

## 2018-03-08 ENCOUNTER — Other Ambulatory Visit: Payer: Self-pay | Admitting: Family Medicine

## 2018-03-09 ENCOUNTER — Other Ambulatory Visit: Payer: Self-pay | Admitting: Family Medicine

## 2018-03-10 ENCOUNTER — Other Ambulatory Visit: Payer: Self-pay

## 2018-03-10 ENCOUNTER — Emergency Department (HOSPITAL_COMMUNITY)
Admission: EM | Admit: 2018-03-10 | Discharge: 2018-03-10 | Disposition: A | Discharge status: Home / Self Care | Payer: Medicaid Other | Attending: Emergency Medicine | Admitting: Emergency Medicine

## 2018-03-10 ENCOUNTER — Encounter (HOSPITAL_COMMUNITY): Payer: Self-pay | Admitting: Emergency Medicine

## 2018-03-10 ENCOUNTER — Emergency Department (HOSPITAL_COMMUNITY): Payer: Medicaid Other

## 2018-03-10 DIAGNOSIS — Z87891 Personal history of nicotine dependence: Secondary | ICD-10-CM | POA: Insufficient documentation

## 2018-03-10 DIAGNOSIS — R1084 Generalized abdominal pain: Secondary | ICD-10-CM | POA: Insufficient documentation

## 2018-03-10 DIAGNOSIS — J45909 Unspecified asthma, uncomplicated: Secondary | ICD-10-CM | POA: Diagnosis not present

## 2018-03-10 DIAGNOSIS — E876 Hypokalemia: Secondary | ICD-10-CM | POA: Insufficient documentation

## 2018-03-10 DIAGNOSIS — K5900 Constipation, unspecified: Secondary | ICD-10-CM | POA: Diagnosis present

## 2018-03-10 DIAGNOSIS — Y638 Failure in dosage during other surgical and medical care: Secondary | ICD-10-CM | POA: Insufficient documentation

## 2018-03-10 DIAGNOSIS — T402X5A Adverse effect of other opioids, initial encounter: Secondary | ICD-10-CM | POA: Diagnosis not present

## 2018-03-10 DIAGNOSIS — Z79899 Other long term (current) drug therapy: Secondary | ICD-10-CM | POA: Diagnosis not present

## 2018-03-10 DIAGNOSIS — I10 Essential (primary) hypertension: Secondary | ICD-10-CM | POA: Insufficient documentation

## 2018-03-10 DIAGNOSIS — R102 Pelvic and perineal pain: Secondary | ICD-10-CM | POA: Insufficient documentation

## 2018-03-10 DIAGNOSIS — K5903 Drug induced constipation: Secondary | ICD-10-CM | POA: Diagnosis not present

## 2018-03-10 LAB — RAPID URINE DRUG SCREEN, HOSP PERFORMED
Amphetamines: NOT DETECTED
Barbiturates: NOT DETECTED
Benzodiazepines: NOT DETECTED
Cocaine: POSITIVE — AB
Opiates: NOT DETECTED
Tetrahydrocannabinol: NOT DETECTED

## 2018-03-10 LAB — CBC WITH DIFFERENTIAL/PLATELET
ABS IMMATURE GRANULOCYTES: 0.01 10*3/uL (ref 0.00–0.07)
Basophils Absolute: 0 10*3/uL (ref 0.0–0.1)
Basophils Relative: 0 %
EOS PCT: 1 %
Eosinophils Absolute: 0 10*3/uL (ref 0.0–0.5)
HCT: 44.4 % (ref 36.0–46.0)
Hemoglobin: 14.4 g/dL (ref 12.0–15.0)
Immature Granulocytes: 0 %
LYMPHS ABS: 3.9 10*3/uL (ref 0.7–4.0)
Lymphocytes Relative: 46 %
MCH: 27.2 pg (ref 26.0–34.0)
MCHC: 32.4 g/dL (ref 30.0–36.0)
MCV: 83.9 fL (ref 80.0–100.0)
Monocytes Absolute: 0.6 10*3/uL (ref 0.1–1.0)
Monocytes Relative: 8 %
NRBC: 0 % (ref 0.0–0.2)
Neutro Abs: 3.8 10*3/uL (ref 1.7–7.7)
Neutrophils Relative %: 45 %
Platelets: 343 10*3/uL (ref 150–400)
RBC: 5.29 MIL/uL — ABNORMAL HIGH (ref 3.87–5.11)
RDW: 14 % (ref 11.5–15.5)
WBC: 8.4 10*3/uL (ref 4.0–10.5)

## 2018-03-10 LAB — URINALYSIS, ROUTINE W REFLEX MICROSCOPIC
Bacteria, UA: NONE SEEN
Bilirubin Urine: NEGATIVE
Glucose, UA: NEGATIVE mg/dL
KETONES UR: NEGATIVE mg/dL
LEUKOCYTE UA: NEGATIVE
Nitrite: NEGATIVE
PROTEIN: NEGATIVE mg/dL
Specific Gravity, Urine: 1.017 (ref 1.005–1.030)
pH: 6 (ref 5.0–8.0)

## 2018-03-10 LAB — COMPREHENSIVE METABOLIC PANEL
ALBUMIN: 3.9 g/dL (ref 3.5–5.0)
ALT: 32 U/L (ref 0–44)
ANION GAP: 15 (ref 5–15)
AST: 38 U/L (ref 15–41)
Alkaline Phosphatase: 76 U/L (ref 38–126)
BUN: 6 mg/dL (ref 6–20)
CO2: 25 mmol/L (ref 22–32)
Calcium: 9.5 mg/dL (ref 8.9–10.3)
Chloride: 97 mmol/L — ABNORMAL LOW (ref 98–111)
Creatinine, Ser: 1.24 mg/dL — ABNORMAL HIGH (ref 0.44–1.00)
GFR calc Af Amer: >60 mL/min (ref >60)
GFR calc non Af Amer: 53 mL/min — ABNORMAL LOW (ref >60)
GLUCOSE: 138 mg/dL — AB (ref 70–99)
POTASSIUM: 2.8 mmol/L — AB (ref 3.5–5.1)
Sodium: 137 mmol/L (ref 135–145)
Total Bilirubin: 0.6 mg/dL (ref 0.3–1.2)
Total Protein: 7.6 g/dL (ref 6.5–8.1)

## 2018-03-10 LAB — LIPASE, BLOOD: Lipase: 32 U/L (ref 11–51)

## 2018-03-10 LAB — MAGNESIUM: Magnesium: 2.2 mg/dL (ref 1.7–2.4)

## 2018-03-10 LAB — I-STAT BETA HCG BLOOD, ED (MC, WL, AP ONLY): I-stat hCG, quantitative: <5 m[IU]/mL (ref <5)

## 2018-03-10 MED ORDER — ONDANSETRON HCL 4 MG/2ML IJ SOLN
4.0000 mg | Freq: Once | INTRAMUSCULAR | Status: AC
  Administered 2018-03-10: 4 mg via INTRAVENOUS
  Filled 2018-03-10: qty 2

## 2018-03-10 MED ORDER — POTASSIUM CHLORIDE 10 MEQ/100ML IV SOLN
10.0000 meq | INTRAVENOUS | Status: AC
  Administered 2018-03-10 (×3): 10 meq via INTRAVENOUS
  Filled 2018-03-10 (×3): qty 100

## 2018-03-10 MED ORDER — POTASSIUM CHLORIDE CRYS ER 20 MEQ PO TBCR: 40.0000 meq | EXTENDED_RELEASE_TABLET | Freq: Two times a day (BID) | ORAL | 0 refills | Status: DC

## 2018-03-10 MED ORDER — PEG 3350-KCL-NABCB-NACL-NASULF 236 G PO SOLR: 4.0000 L | Freq: Once | ORAL | 0 refills | Status: AC

## 2018-03-10 MED ORDER — POTASSIUM CHLORIDE CRYS ER 20 MEQ PO TBCR
40.0000 meq | EXTENDED_RELEASE_TABLET | Freq: Once | ORAL | Status: AC
  Administered 2018-03-10: 40 meq via ORAL
  Filled 2018-03-10: qty 2

## 2018-03-10 MED ORDER — HYDROMORPHONE HCL 1 MG/ML IJ SOLN
1.0000 mg | Freq: Once | INTRAMUSCULAR | Status: AC
  Administered 2018-03-10: 1 mg via INTRAVENOUS
  Filled 2018-03-10: qty 1

## 2018-03-10 MED ORDER — IOHEXOL 300 MG/ML  SOLN
100.0000 mL | Freq: Once | INTRAMUSCULAR | Status: AC | PRN
  Administered 2018-03-10: 100 mL via INTRAVENOUS
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
## 2018-03-10 NOTE — ED Notes (Signed)
This RN assisted pt to restroom; pt reports having a "small" bowel movement.

## 2018-03-10 NOTE — ED Triage Notes (Addendum)
BIB [facility] from home with c/o of constipation. Pt states she hasn't had a bowel movement in 14 days. Pt reports  giving herself 2 enemas tonight.  Pt crying and screaming in pain on arrival. Bowel sounds hyperactive.

## 2018-03-10 NOTE — ED Notes (Signed)
ED Provider at bedside. 

## 2018-03-10 NOTE — Discharge Instructions (Addendum)
Do not take narcotic pain medication - it will make your constipation worse.  DO NOT USE COCAINE!!!

## 2018-03-10 NOTE — ED Notes (Signed)
Patient transported to CT 

## 2018-03-10 NOTE — ED Notes (Signed)
Patient verbalizes understanding of discharge instructions. Opportunity for questioning and answers were provided. Armband removed by staff, pt discharged from ED.  

## 2018-03-11 ENCOUNTER — Other Ambulatory Visit: Payer: Self-pay | Admitting: Family Medicine

## 2018-03-13 ENCOUNTER — Encounter: Payer: Self-pay | Admitting: Family Medicine

## 2018-03-13 ENCOUNTER — Ambulatory Visit (INDEPENDENT_AMBULATORY_CARE_PROVIDER_SITE_OTHER): Payer: Medicaid Other | Admitting: Family Medicine

## 2018-03-13 ENCOUNTER — Other Ambulatory Visit: Payer: Self-pay

## 2018-03-13 DIAGNOSIS — K5900 Constipation, unspecified: Secondary | ICD-10-CM | POA: Diagnosis present

## 2018-03-13 DIAGNOSIS — R1084 Generalized abdominal pain: Secondary | ICD-10-CM

## 2018-03-13 DIAGNOSIS — E876 Hypokalemia: Secondary | ICD-10-CM

## 2018-03-13 MED ORDER — PROMETHAZINE HCL 12.5 MG RE SUPP: 12.5000 mg | Freq: Two times a day (BID) | RECTAL | 1 refills | Status: DC | PRN

## 2018-03-13 NOTE — Progress Notes (Signed)
    CHIEF COMPLAINT / HPI: #1.  Discussion about recurrent abdominal pain, nausea and vomiting, constipation, weight loss.  Sounds like she alternates between being constipated and having nausea and vomiting.  Has been seen at the emergency department where they repeated her work-up. #2.  Menorrhagia  with fibroid uterus, was planned for hysterectomy: Hysterectomy was discontinued secondary to positive urine drug screen.  She plans to follow-up with her OB/GYN in the next week.  Hopes to reschedule this. #3.  Positive UDS: Reiterates that she is not smoking marijuana but her urine drug screen was positive for opiates and cocaine.  REVIEW OF SYSTEMS: Recurrent nausea, daily.  Vomiting daily or every other day.  Constipation 2-3 times a week.  Weight loss unplanned.  Appetite is down.  Denies suicidal or homicidal ideation.  Mood is depressed.  PERTINENT  PMH / PSH: I have reviewed the patient's medications, allergies, past medical and surgical history, smoking status and updated in the EMR as appropriate.  ED visit review: CT scan abdomen pelvis done 03/10/2018 IMPRESSION: 1. Large colonic stool burden without evidence of enteric obstruction. 2. Otherwise, no explanation for patient's abdominal pain. Specifically, no evidence of urinary obstruction. Normal appearance of the appendix. 3. Small hiatal hernia. 4. Myomatous uterus.  CMP showing potassium 2.8 OBJECTIVE:  Vital signs reviewed. GENERAL: Well-developed, well-nourished, no acute distress. CARDIOVASCULAR: Regular rate and rhythm no murmur gallop or rub LUNGS: Clear to auscultation bilaterally, no rales or wheeze. ABDOMEN: Soft positive bowel sounds NEURO: No gross focal neurological deficits. MSK: Movement of extremity x 4. PSYCH: AxOx4. Good eye contact.. No psychomotor retardation or agitation. Appropriate speech fluency and content. Asks and answers questions appropriately. Mood is mildly flat.     ASSESSMENT /  PLAN:  Constipation We discussed her constipation.  She admits to taking some opiates she had left over from some previous pain clinic management.  I explained how this works against her functional bowel problems.  I discharged her with some ethylene glycol.  Evidently they sent her 1 large 2 L bottle.  I told her to refrigerated and drink 8 ounces a day.  I like to see her back in 3 to 4 weeks.  I do think her constipation is contributing to her chronic nausea.  Abdominal pain Chronic recurrent abdominal pain with nausea and vomiting.  She has had complete GI work-up.  Recently had a ED visit.  Tests reviewed.  I think this is complicated by her psychiatric issues.  She is talked with her healthcare worker at [facility] and they plan to get her in with a new therapist as soon as possible.  I think that would be a great idea for her.  We also discussed how illicit drugs can affect GI tract.  Notably she is been positive for cocaine and they discontinued or delayed her surgery for hysterectomy because of that.  Discussed illicit drug use. I did give her prescription for some Phenergan suppositories.  Hypokalemia They had given her some potassium supplementation in the ED and sent her home with a prescription.  I am not sure she needs to take it every day but certainly if she is having a lot of vomiting she probably needs to take 1 daily.  I will repeat check her potassium I see her back in 1 month.

## 2018-03-13 NOTE — Assessment & Plan Note (Addendum)
We discussed her constipation.  She admits to taking some opiates she had left over from some previous pain clinic management.  I explained how this works against her functional bowel problems.  I discharged her with some ethylene glycol.  Evidently they sent her 1 large 2 L bottle.  I told her to refrigerated and drink 8 ounces a day.  I like to see her back in 3 to 4 weeks.  I do think her constipation is contributing to her chronic nausea.

## 2018-03-13 NOTE — Assessment & Plan Note (Signed)
>>  ASSESSMENT AND PLAN FOR ABDOMINAL PAIN WRITTEN ON 03/13/2018  4:11 PM BY Lord Lancour L, MD  Chronic recurrent abdominal pain with nausea and vomiting.  She has had complete GI work-up.  Recently had a ED visit.  Tests reviewed.  I think this is complicated by her psychiatric issues.  She is talked with her healthcare worker at [facility] and they plan to get her in with a new therapist as soon as possible.  I think that would be a great idea for her.  We also discussed how illicit drugs can affect GI tract.  Notably she is been positive for cocaine and they discontinued or delayed her surgery for hysterectomy because of that.  Discussed illicit drug use. I did give her prescription for some Phenergan  suppositories.

## 2018-03-13 NOTE — Assessment & Plan Note (Addendum)
Chronic recurrent abdominal pain with nausea and vomiting.  She has had complete GI work-up.  Recently had a ED visit.  Tests reviewed.  I think this is complicated by her psychiatric issues.  She is talked with her healthcare worker at [facility] and they plan to get her in with a new therapist as soon as possible.  I think that would be a great idea for her.  We also discussed how illicit drugs can affect GI tract.  Notably she is been positive for cocaine and they discontinued or delayed her surgery for hysterectomy because of that.  Discussed illicit drug use. I did give her prescription for some Phenergan suppositories.

## 2018-03-13 NOTE — Assessment & Plan Note (Signed)
They had given her some potassium supplementation in the ED and sent her home with a prescription.  I am not sure she needs to take it every day but certainly if she is having a lot of vomiting she probably needs to take 1 daily.  I will repeat check her potassium I see her back in 1 month.

## 2018-03-18 ENCOUNTER — Other Ambulatory Visit: Payer: Self-pay | Admitting: Family Medicine

## 2018-03-18 ENCOUNTER — Telehealth: Payer: Self-pay

## 2018-03-18 NOTE — Telephone Encounter (Signed)
Patient would like a referral to a nutritionist for pancreatitis.  Holly Danker, RN [facility] RN)

## 2018-03-19 ENCOUNTER — Other Ambulatory Visit: Payer: Self-pay | Admitting: Family Medicine

## 2018-03-21 NOTE — Telephone Encounter (Signed)
Dear Holly Mendez Team Unfortunately her insurance will not cover a nutristionist for pancreatitis Holly Mendez

## 2018-03-22 NOTE — Telephone Encounter (Signed)
Pt returning call, I let her know of the information below and she was understanding it and appreciated the call.

## 2018-03-22 NOTE — Telephone Encounter (Signed)
LVM to call office so we can inform her of below.  Please give her the below message if she calls back. April Zimmerman Rumple, CMA

## 2018-04-01 ENCOUNTER — Other Ambulatory Visit: Payer: Self-pay | Admitting: Family Medicine

## 2018-04-15 ENCOUNTER — Other Ambulatory Visit: Payer: Self-pay | Admitting: Family Medicine

## 2018-04-17 ENCOUNTER — Encounter: Payer: Self-pay | Admitting: *Deleted

## 2018-04-23 ENCOUNTER — Other Ambulatory Visit: Payer: Self-pay | Admitting: Family Medicine

## 2018-04-25 ENCOUNTER — Other Ambulatory Visit: Payer: Self-pay | Admitting: Family Medicine

## 2018-04-30 ENCOUNTER — Other Ambulatory Visit: Payer: Self-pay | Admitting: Family Medicine

## 2018-05-06 ENCOUNTER — Other Ambulatory Visit: Payer: Self-pay | Admitting: Family Medicine

## 2018-05-15 ENCOUNTER — Encounter: Payer: Self-pay | Admitting: Family Medicine

## 2018-05-15 ENCOUNTER — Ambulatory Visit (INDEPENDENT_AMBULATORY_CARE_PROVIDER_SITE_OTHER): Payer: Medicaid Other | Admitting: Family Medicine

## 2018-05-15 ENCOUNTER — Other Ambulatory Visit: Payer: Self-pay

## 2018-05-15 VITALS — BP 112/88 | HR 96 | Wt 210.2 lb

## 2018-05-15 DIAGNOSIS — I10 Essential (primary) hypertension: Secondary | ICD-10-CM | POA: Diagnosis not present

## 2018-05-15 DIAGNOSIS — E162 Hypoglycemia, unspecified: Secondary | ICD-10-CM | POA: Diagnosis present

## 2018-05-15 LAB — POCT GLYCOSYLATED HEMOGLOBIN (HGB A1C): HbA1c, POC (controlled diabetic range): 5.7 % (ref 0.0–7.0)

## 2018-05-15 MED ORDER — AMITRIPTYLINE HCL 25 MG PO TABS: ORAL_TABLET | ORAL | 1 refills | Status: DC

## 2018-05-15 NOTE — Patient Instructions (Signed)
Continue to eat your current diet. You are doing very well! I will send you a note about your blood work!

## 2018-05-15 NOTE — Progress Notes (Signed)
      CHIEF COMPLAINT / HPI: Concerned she may have early diabetes.  She is having a lot of dry mouth which she was concerned was a symptom of diabetes mellitus.  Frequency of urination.  No headache.  She has had some intermittent fatigue.  Appetite's been stable.  REVIEW OF SYSTEMS:   See HPI  PERTINENT  PMH / PSH: I have reviewed the patient's medications, allergies, past medical and surgical history, smoking status and updated in the EMR as appropriate. On prior hospitalization at one time she had some history of hyperglycemia but is not had any since then that was about 2 years ago  OBJECTIVE:  Vital signs reviewed. GENERAL: Well-developed, well-nourished, no acute distress. CARDIOVASCULAR: Regular rate and rhythm no murmur gallop or rub LUNGS: Clear to auscultation bilaterally, no rales or wheeze. ABDOMEN: Soft positive bowel sounds NEURO: No gross focal neurological deficits. MSK: Movement of extremity x 4.   ASSESSMENT / PLAN:  HTN (hypertension) Continue current medications.  We will check electrolytes and kidney function today as well as blood sugar.  .. We discussed symptoms of diabetes mellitus.  I think her dry mouth is related to taking amitriptyline at night for sleep.  We will decrease from 3 tabs to 2 tabs.  Also not sure she is eating necessarily regular basis.  She may actually be having some hypoglycemia in the mornings.  Will check nonfasting blood sugar today.

## 2018-05-15 NOTE — Assessment & Plan Note (Signed)
Continue current medications.  We will check electrolytes and kidney function today as well as blood sugar.

## 2018-05-16 ENCOUNTER — Encounter: Payer: Self-pay | Admitting: Family Medicine

## 2018-05-16 LAB — COMPREHENSIVE METABOLIC PANEL
ALT: 16 IU/L (ref 0–32)
AST: 19 IU/L (ref 0–40)
Albumin/Globulin Ratio: 1.4 (ref 1.2–2.2)
Albumin: 4.1 g/dL (ref 3.8–4.8)
Alkaline Phosphatase: 88 IU/L (ref 39–117)
BUN/Creatinine Ratio: 10 (ref 9–23)
BUN: 10 mg/dL (ref 6–24)
Bilirubin Total: 0.2 mg/dL (ref 0.0–1.2)
CO2: 23 mmol/L (ref 20–29)
Calcium: 9.8 mg/dL (ref 8.7–10.2)
Chloride: 102 mmol/L (ref 96–106)
Creatinine, Ser: 1 mg/dL (ref 0.57–1.00)
GFR calc Af Amer: 79 mL/min/{1.73_m2} (ref >59)
GFR calc non Af Amer: 69 mL/min/{1.73_m2} (ref >59)
Globulin, Total: 2.9 g/dL (ref 1.5–4.5)
Glucose: 92 mg/dL (ref 65–99)
Potassium: 4.4 mmol/L (ref 3.5–5.2)
Sodium: 140 mmol/L (ref 134–144)
Total Protein: 7 g/dL (ref 6.0–8.5)

## 2018-05-24 ENCOUNTER — Emergency Department (HOSPITAL_COMMUNITY): Payer: Medicaid Other

## 2018-05-24 ENCOUNTER — Emergency Department (HOSPITAL_COMMUNITY)
Admission: EM | Admit: 2018-05-24 | Discharge: 2018-05-24 | Disposition: A | Discharge status: Home / Self Care | Payer: Medicaid Other | Attending: Emergency Medicine | Admitting: Emergency Medicine

## 2018-05-24 ENCOUNTER — Other Ambulatory Visit: Payer: Self-pay

## 2018-05-24 ENCOUNTER — Encounter (HOSPITAL_COMMUNITY): Payer: Self-pay | Admitting: Emergency Medicine

## 2018-05-24 DIAGNOSIS — Z87891 Personal history of nicotine dependence: Secondary | ICD-10-CM | POA: Insufficient documentation

## 2018-05-24 DIAGNOSIS — R1013 Epigastric pain: Secondary | ICD-10-CM | POA: Insufficient documentation

## 2018-05-24 DIAGNOSIS — J45909 Unspecified asthma, uncomplicated: Secondary | ICD-10-CM | POA: Diagnosis not present

## 2018-05-24 DIAGNOSIS — E876 Hypokalemia: Secondary | ICD-10-CM | POA: Insufficient documentation

## 2018-05-24 DIAGNOSIS — Z79899 Other long term (current) drug therapy: Secondary | ICD-10-CM | POA: Diagnosis not present

## 2018-05-24 DIAGNOSIS — K5900 Constipation, unspecified: Secondary | ICD-10-CM | POA: Diagnosis not present

## 2018-05-24 DIAGNOSIS — R1084 Generalized abdominal pain: Secondary | ICD-10-CM

## 2018-05-24 DIAGNOSIS — R112 Nausea with vomiting, unspecified: Secondary | ICD-10-CM | POA: Diagnosis not present

## 2018-05-24 DIAGNOSIS — I10 Essential (primary) hypertension: Secondary | ICD-10-CM | POA: Diagnosis not present

## 2018-05-24 LAB — COMPREHENSIVE METABOLIC PANEL
ALT: 24 U/L (ref 0–44)
AST: 22 U/L (ref 15–41)
Albumin: 4.5 g/dL (ref 3.5–5.0)
Alkaline Phosphatase: 71 U/L (ref 38–126)
Anion gap: 17 — ABNORMAL HIGH (ref 5–15)
BUN: 15 mg/dL (ref 6–20)
CO2: 27 mmol/L (ref 22–32)
Calcium: 9.7 mg/dL (ref 8.9–10.3)
Chloride: 89 mmol/L — ABNORMAL LOW (ref 98–111)
Creatinine, Ser: 1.89 mg/dL — ABNORMAL HIGH (ref 0.44–1.00)
GFR calc Af Amer: 37 mL/min — ABNORMAL LOW (ref >60)
GFR calc non Af Amer: 32 mL/min — ABNORMAL LOW (ref >60)
Glucose, Bld: 129 mg/dL — ABNORMAL HIGH (ref 70–99)
Potassium: 2.6 mmol/L — CL (ref 3.5–5.1)
Sodium: 133 mmol/L — ABNORMAL LOW (ref 135–145)
Total Bilirubin: 0.9 mg/dL (ref 0.3–1.2)
Total Protein: 7.8 g/dL (ref 6.5–8.1)

## 2018-05-24 LAB — CBC
HCT: 40.6 % (ref 36.0–46.0)
Hemoglobin: 14.2 g/dL (ref 12.0–15.0)
MCH: 28.7 pg (ref 26.0–34.0)
MCHC: 35 g/dL (ref 30.0–36.0)
MCV: 82.2 fL (ref 80.0–100.0)
Platelets: 322 10*3/uL (ref 150–400)
RBC: 4.94 MIL/uL (ref 3.87–5.11)
RDW: 14.1 % (ref 11.5–15.5)
WBC: 8.2 10*3/uL (ref 4.0–10.5)
nRBC: 0 % (ref 0.0–0.2)

## 2018-05-24 LAB — URINALYSIS, ROUTINE W REFLEX MICROSCOPIC
Bacteria, UA: NONE SEEN
Bilirubin Urine: NEGATIVE
Glucose, UA: NEGATIVE mg/dL
Ketones, ur: 5 mg/dL — AB
Leukocytes,Ua: NEGATIVE
Nitrite: NEGATIVE
Protein, ur: NEGATIVE mg/dL
RBC / HPF: >50 RBC/hpf — ABNORMAL HIGH (ref 0–5)
Specific Gravity, Urine: 1.027 (ref 1.005–1.030)
pH: 6 (ref 5.0–8.0)

## 2018-05-24 LAB — LIPASE, BLOOD: Lipase: 79 U/L — ABNORMAL HIGH (ref 11–51)

## 2018-05-24 LAB — MAGNESIUM: Magnesium: 2.5 mg/dL — ABNORMAL HIGH (ref 1.7–2.4)

## 2018-05-24 MED ORDER — LIDOCAINE VISCOUS HCL 2 % MT SOLN
15.0000 mL | Freq: Once | OROMUCOSAL | Status: AC
  Administered 2018-05-24: 15 mL via ORAL
  Filled 2018-05-24: qty 15

## 2018-05-24 MED ORDER — IOPAMIDOL (ISOVUE-300) INJECTION 61%
80.0000 mL | Freq: Once | INTRAVENOUS | Status: AC | PRN
  Administered 2018-05-24: 80 mL via INTRAVENOUS

## 2018-05-24 MED ORDER — POTASSIUM CHLORIDE CRYS ER 20 MEQ PO TBCR
40.0000 meq | EXTENDED_RELEASE_TABLET | Freq: Once | ORAL | Status: AC
  Administered 2018-05-24: 40 meq via ORAL
  Filled 2018-05-24: qty 2

## 2018-05-24 MED ORDER — SODIUM CHLORIDE 0.9% FLUSH
3.0000 mL | Freq: Once | INTRAVENOUS | Status: AC
  Administered 2018-05-24: 07:00:00 3 mL via INTRAVENOUS

## 2018-05-24 MED ORDER — POLYETHYLENE GLYCOL 3350 17 G PO PACK: 17.0000 g | PACK | Freq: Every day | ORAL | 0 refills | Status: DC

## 2018-05-24 MED ORDER — ONDANSETRON HCL 4 MG/2ML IJ SOLN
4.0000 mg | Freq: Once | INTRAMUSCULAR | Status: AC
  Administered 2018-05-24: 4 mg via INTRAVENOUS
  Filled 2018-05-24: qty 2

## 2018-05-24 MED ORDER — MORPHINE SULFATE (PF) 4 MG/ML IV SOLN
4.0000 mg | Freq: Once | INTRAVENOUS | Status: AC
  Administered 2018-05-24: 4 mg via INTRAVENOUS
  Filled 2018-05-24: qty 1

## 2018-05-24 MED ORDER — ONDANSETRON 4 MG PO TBDP: 4.0000 mg | ORAL_TABLET | Freq: Three times a day (TID) | ORAL | 0 refills | Status: DC | PRN

## 2018-05-24 MED ORDER — SODIUM CHLORIDE 0.9 % IV BOLUS
1000.0000 mL | Freq: Once | INTRAVENOUS | Status: AC
  Administered 2018-05-24: 1000 mL via INTRAVENOUS

## 2018-05-24 MED ORDER — ALUM & MAG HYDROXIDE-SIMETH 200-200-20 MG/5ML PO SUSP
30.0000 mL | Freq: Once | ORAL | Status: AC
  Administered 2018-05-24: 30 mL via ORAL
  Filled 2018-05-24: qty 30

## 2018-05-24 NOTE — ED Notes (Signed)
MD notified of k of 2.6
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
## 2018-05-24 NOTE — ED Notes (Signed)
Pt ambulated to restroom with steady gait to collect urine sample 

## 2018-05-24 NOTE — ED Notes (Signed)
Pt requesting more pain meds. MD notified

## 2018-05-24 NOTE — ED Triage Notes (Addendum)
Per ems pt was at home and for the past 2 weeks she has been having nausea, vomiting and not been able to hold anything down. Pt says it is hurting in the center area Pt took phenergan at 9am with no relief. 140/90, 96 ra, p 100, cbg 180

## 2018-05-24 NOTE — ED Notes (Signed)
Urine culture collected and sent to main lab. 

## 2018-05-27 ENCOUNTER — Inpatient Hospital Stay (HOSPITAL_COMMUNITY)
Admission: EM | Admit: 2018-05-27 | Discharge: 2018-05-29 | DRG: 440 | Discharge status: Against Medical Advice | Payer: Medicaid Other | Attending: Family Medicine | Admitting: Family Medicine

## 2018-05-27 ENCOUNTER — Other Ambulatory Visit: Payer: Self-pay

## 2018-05-27 ENCOUNTER — Other Ambulatory Visit: Payer: Self-pay | Admitting: Family Medicine

## 2018-05-27 ENCOUNTER — Encounter (HOSPITAL_COMMUNITY): Payer: Self-pay | Admitting: Emergency Medicine

## 2018-05-27 DIAGNOSIS — T502X5A Adverse effect of carbonic-anhydrase inhibitors, benzothiadiazides and other diuretics, initial encounter: Secondary | ICD-10-CM | POA: Diagnosis present

## 2018-05-27 DIAGNOSIS — H5462 Unqualified visual loss, left eye, normal vision right eye: Secondary | ICD-10-CM | POA: Diagnosis present

## 2018-05-27 DIAGNOSIS — K859 Acute pancreatitis without necrosis or infection, unspecified: Secondary | ICD-10-CM | POA: Diagnosis not present

## 2018-05-27 DIAGNOSIS — K5900 Constipation, unspecified: Secondary | ICD-10-CM | POA: Diagnosis present

## 2018-05-27 DIAGNOSIS — M19011 Primary osteoarthritis, right shoulder: Secondary | ICD-10-CM | POA: Diagnosis present

## 2018-05-27 DIAGNOSIS — M797 Fibromyalgia: Secondary | ICD-10-CM | POA: Diagnosis present

## 2018-05-27 DIAGNOSIS — J45909 Unspecified asthma, uncomplicated: Secondary | ICD-10-CM | POA: Diagnosis present

## 2018-05-27 DIAGNOSIS — Z789 Other specified health status: Secondary | ICD-10-CM

## 2018-05-27 DIAGNOSIS — K852 Alcohol induced acute pancreatitis without necrosis or infection: Secondary | ICD-10-CM

## 2018-05-27 DIAGNOSIS — E876 Hypokalemia: Secondary | ICD-10-CM | POA: Diagnosis present

## 2018-05-27 DIAGNOSIS — Z888 Allergy status to other drugs, medicaments and biological substances status: Secondary | ICD-10-CM

## 2018-05-27 DIAGNOSIS — M19012 Primary osteoarthritis, left shoulder: Secondary | ICD-10-CM | POA: Diagnosis present

## 2018-05-27 DIAGNOSIS — G8929 Other chronic pain: Secondary | ICD-10-CM | POA: Diagnosis present

## 2018-05-27 DIAGNOSIS — Z881 Allergy status to other antibiotic agents status: Secondary | ICD-10-CM

## 2018-05-27 DIAGNOSIS — Z20828 Contact with and (suspected) exposure to other viral communicable diseases: Secondary | ICD-10-CM | POA: Diagnosis present

## 2018-05-27 DIAGNOSIS — K219 Gastro-esophageal reflux disease without esophagitis: Secondary | ICD-10-CM | POA: Diagnosis present

## 2018-05-27 DIAGNOSIS — Z791 Long term (current) use of non-steroidal anti-inflammatories (NSAID): Secondary | ICD-10-CM

## 2018-05-27 DIAGNOSIS — F319 Bipolar disorder, unspecified: Secondary | ICD-10-CM | POA: Diagnosis present

## 2018-05-27 DIAGNOSIS — F259 Schizoaffective disorder, unspecified: Secondary | ICD-10-CM | POA: Diagnosis present

## 2018-05-27 DIAGNOSIS — M545 Low back pain: Secondary | ICD-10-CM | POA: Diagnosis present

## 2018-05-27 DIAGNOSIS — Z87891 Personal history of nicotine dependence: Secondary | ICD-10-CM

## 2018-05-27 DIAGNOSIS — Z9851 Tubal ligation status: Secondary | ICD-10-CM | POA: Diagnosis not present

## 2018-05-27 DIAGNOSIS — E669 Obesity, unspecified: Secondary | ICD-10-CM | POA: Diagnosis present

## 2018-05-27 DIAGNOSIS — F1910 Other psychoactive substance abuse, uncomplicated: Secondary | ICD-10-CM | POA: Diagnosis present

## 2018-05-27 DIAGNOSIS — E785 Hyperlipidemia, unspecified: Secondary | ICD-10-CM | POA: Diagnosis present

## 2018-05-27 DIAGNOSIS — F250 Schizoaffective disorder, bipolar type: Secondary | ICD-10-CM

## 2018-05-27 DIAGNOSIS — K861 Other chronic pancreatitis: Secondary | ICD-10-CM | POA: Diagnosis present

## 2018-05-27 DIAGNOSIS — Z79899 Other long term (current) drug therapy: Secondary | ICD-10-CM | POA: Diagnosis not present

## 2018-05-27 DIAGNOSIS — Z6834 Body mass index (BMI) 34.0-34.9, adult: Secondary | ICD-10-CM

## 2018-05-27 DIAGNOSIS — I10 Essential (primary) hypertension: Secondary | ICD-10-CM | POA: Diagnosis not present

## 2018-05-27 DIAGNOSIS — Z970 Presence of artificial eye: Secondary | ICD-10-CM

## 2018-05-27 LAB — CBC WITH DIFFERENTIAL/PLATELET
Abs Immature Granulocytes: 0.04 10*3/uL (ref 0.00–0.07)
Basophils Absolute: 0 10*3/uL (ref 0.0–0.1)
Basophils Relative: 0 %
Eosinophils Absolute: 0 10*3/uL (ref 0.0–0.5)
Eosinophils Relative: 0 %
HCT: 40.2 % (ref 36.0–46.0)
Hemoglobin: 13.6 g/dL (ref 12.0–15.0)
Immature Granulocytes: 1 %
Lymphocytes Relative: 22 %
Lymphs Abs: 1.9 10*3/uL (ref 0.7–4.0)
MCH: 28 pg (ref 26.0–34.0)
MCHC: 33.8 g/dL (ref 30.0–36.0)
MCV: 82.7 fL (ref 80.0–100.0)
Monocytes Absolute: 0.8 10*3/uL (ref 0.1–1.0)
Monocytes Relative: 9 %
Neutro Abs: 5.8 10*3/uL (ref 1.7–7.7)
Neutrophils Relative %: 68 %
Platelets: 297 10*3/uL (ref 150–400)
RBC: 4.86 MIL/uL (ref 3.87–5.11)
RDW: 13.9 % (ref 11.5–15.5)
WBC: 8.5 10*3/uL (ref 4.0–10.5)
nRBC: 0 % (ref 0.0–0.2)

## 2018-05-27 LAB — URINALYSIS, ROUTINE W REFLEX MICROSCOPIC
Bacteria, UA: NONE SEEN
Bilirubin Urine: NEGATIVE
Glucose, UA: NEGATIVE mg/dL
Ketones, ur: NEGATIVE mg/dL
Leukocytes,Ua: NEGATIVE
Nitrite: NEGATIVE
Protein, ur: NEGATIVE mg/dL
Specific Gravity, Urine: 1.008 (ref 1.005–1.030)
pH: 9 — ABNORMAL HIGH (ref 5.0–8.0)

## 2018-05-27 LAB — COMPREHENSIVE METABOLIC PANEL
ALT: 21 U/L (ref 0–44)
AST: 20 U/L (ref 15–41)
Albumin: 4.1 g/dL (ref 3.5–5.0)
Alkaline Phosphatase: 60 U/L (ref 38–126)
Anion gap: 13 (ref 5–15)
BUN: 5 mg/dL — ABNORMAL LOW (ref 6–20)
CO2: 28 mmol/L (ref 22–32)
Calcium: 9.8 mg/dL (ref 8.9–10.3)
Chloride: 97 mmol/L — ABNORMAL LOW (ref 98–111)
Creatinine, Ser: 1.1 mg/dL — ABNORMAL HIGH (ref 0.44–1.00)
GFR calc Af Amer: >60 mL/min (ref >60)
GFR calc non Af Amer: >60 mL/min (ref >60)
Glucose, Bld: 133 mg/dL — ABNORMAL HIGH (ref 70–99)
Potassium: 2.7 mmol/L — CL (ref 3.5–5.1)
Sodium: 138 mmol/L (ref 135–145)
Total Bilirubin: 0.6 mg/dL (ref 0.3–1.2)
Total Protein: 7.6 g/dL (ref 6.5–8.1)

## 2018-05-27 LAB — SARS CORONAVIRUS 2 BY RT PCR (HOSPITAL ORDER, PERFORMED IN ~~LOC~~ HOSPITAL LAB): SARS Coronavirus 2: NEGATIVE

## 2018-05-27 LAB — LIPASE, BLOOD: Lipase: 376 U/L — ABNORMAL HIGH (ref 11–51)

## 2018-05-27 MED ORDER — ONDANSETRON HCL 4 MG/2ML IJ SOLN
4.0000 mg | Freq: Once | INTRAMUSCULAR | Status: AC
  Administered 2018-05-27: 16:00:00 4 mg via INTRAVENOUS
  Filled 2018-05-27: qty 2

## 2018-05-27 MED ORDER — NALOXONE HCL 4 MG/0.1ML NA LIQD: 1.0000 | NASAL | Status: DC | PRN

## 2018-05-27 MED ORDER — LACTATED RINGERS IV SOLN
INTRAVENOUS | Status: DC
  Administered 2018-05-27 – 2018-05-28 (×2): via INTRAVENOUS

## 2018-05-27 MED ORDER — ONDANSETRON 4 MG PO TBDP: 4.0000 mg | ORAL_TABLET | Freq: Three times a day (TID) | ORAL | Status: DC | PRN

## 2018-05-27 MED ORDER — ONDANSETRON HCL 4 MG/2ML IJ SOLN
4.0000 mg | Freq: Once | INTRAMUSCULAR | Status: DC
  Filled 2018-05-27: qty 2

## 2018-05-27 MED ORDER — DICYCLOMINE HCL 20 MG PO TABS
20.0000 mg | ORAL_TABLET | Freq: Two times a day (BID) | ORAL | Status: DC
  Administered 2018-05-27 – 2018-05-29 (×4): 20 mg via ORAL
  Filled 2018-05-27 (×4): qty 1

## 2018-05-27 MED ORDER — FLUTICASONE FUROATE-VILANTEROL 100-25 MCG/INH IN AEPB
1.0000 | INHALATION_SPRAY | Freq: Every day | RESPIRATORY_TRACT | Status: DC
  Administered 2018-05-28: 1 via RESPIRATORY_TRACT
  Filled 2018-05-27: qty 28

## 2018-05-27 MED ORDER — AMITRIPTYLINE HCL 25 MG PO TABS: 50.0000 mg | ORAL_TABLET | Freq: Every evening | ORAL | Status: DC | PRN

## 2018-05-27 MED ORDER — METOCLOPRAMIDE HCL 10 MG PO TABS: 10.0000 mg | ORAL_TABLET | Freq: Three times a day (TID) | ORAL | Status: DC | PRN

## 2018-05-27 MED ORDER — POTASSIUM CHLORIDE 10 MEQ/100ML IV SOLN
10.0000 meq | INTRAVENOUS | Status: AC
  Administered 2018-05-27 (×2): 10 meq via INTRAVENOUS
  Filled 2018-05-27 (×2): qty 100

## 2018-05-27 MED ORDER — PANTOPRAZOLE SODIUM 40 MG PO TBEC: 40.0000 mg | DELAYED_RELEASE_TABLET | Freq: Every day | ORAL | Status: DC

## 2018-05-27 MED ORDER — SODIUM CHLORIDE 0.9 % IV BOLUS
1000.0000 mL | Freq: Once | INTRAVENOUS | Status: AC
  Administered 2018-05-27: 1000 mL via INTRAVENOUS

## 2018-05-27 MED ORDER — HYDROMORPHONE HCL 1 MG/ML IJ SOLN: 1.0000 mg | Freq: Once | INTRAMUSCULAR | Status: DC

## 2018-05-27 MED ORDER — PROMETHAZINE HCL 25 MG PO TABS
12.5000 mg | ORAL_TABLET | Freq: Four times a day (QID) | ORAL | Status: DC | PRN
  Administered 2018-05-27 – 2018-05-29 (×5): 12.5 mg via ORAL
  Filled 2018-05-27 (×5): qty 1

## 2018-05-27 MED ORDER — CLONIDINE HCL 0.1 MG/24HR TD PTWK
0.1000 mg | MEDICATED_PATCH | TRANSDERMAL | Status: DC
  Administered 2018-05-29: 09:00:00 0.1 mg via TRANSDERMAL
  Filled 2018-05-27: qty 1

## 2018-05-27 MED ORDER — LIDOCAINE VISCOUS HCL 2 % MT SOLN
15.0000 mL | Freq: Once | OROMUCOSAL | Status: AC
  Administered 2018-05-27: 15 mL via ORAL
  Filled 2018-05-27: qty 15

## 2018-05-27 MED ORDER — LORATADINE 10 MG PO TABS
10.0000 mg | ORAL_TABLET | Freq: Every day | ORAL | Status: DC
  Administered 2018-05-28 – 2018-05-29 (×2): 10 mg via ORAL
  Filled 2018-05-27 (×2): qty 1

## 2018-05-27 MED ORDER — ALUM & MAG HYDROXIDE-SIMETH 200-200-20 MG/5ML PO SUSP
30.0000 mL | Freq: Once | ORAL | Status: AC
  Administered 2018-05-27: 30 mL via ORAL
  Filled 2018-05-27: qty 30

## 2018-05-27 MED ORDER — ENOXAPARIN SODIUM 40 MG/0.4ML ~~LOC~~ SOLN
40.0000 mg | SUBCUTANEOUS | Status: DC
  Administered 2018-05-27 – 2018-05-28 (×2): 40 mg via SUBCUTANEOUS
  Filled 2018-05-27 (×2): qty 0.4

## 2018-05-27 MED ORDER — ACETAMINOPHEN 325 MG PO TABS: 650.0000 mg | ORAL_TABLET | Freq: Four times a day (QID) | ORAL | Status: DC | PRN

## 2018-05-27 MED ORDER — FENTANYL CITRATE (PF) 100 MCG/2ML IJ SOLN
25.0000 ug | INTRAMUSCULAR | Status: DC | PRN
  Administered 2018-05-27 – 2018-05-28 (×3): 25 ug via INTRAVENOUS
  Filled 2018-05-27 (×3): qty 2

## 2018-05-27 MED ORDER — POTASSIUM CHLORIDE CRYS ER 20 MEQ PO TBCR
40.0000 meq | EXTENDED_RELEASE_TABLET | Freq: Once | ORAL | Status: AC
  Administered 2018-05-27: 40 meq via ORAL
  Filled 2018-05-27: qty 2

## 2018-05-27 MED ORDER — ALBUTEROL SULFATE (2.5 MG/3ML) 0.083% IN NEBU: 3.0000 mL | INHALATION_SOLUTION | Freq: Four times a day (QID) | RESPIRATORY_TRACT | Status: DC | PRN

## 2018-05-27 MED ORDER — POLYETHYLENE GLYCOL 3350 17 G PO PACK
17.0000 g | PACK | Freq: Every day | ORAL | Status: DC
  Filled 2018-05-27 (×2): qty 1

## 2018-05-27 MED ORDER — LURASIDONE HCL 20 MG PO TABS
60.0000 mg | ORAL_TABLET | Freq: Every day | ORAL | Status: DC
  Administered 2018-05-28 – 2018-05-29 (×2): 60 mg via ORAL
  Filled 2018-05-27 (×2): qty 3

## 2018-05-27 MED ORDER — HALOPERIDOL LACTATE 5 MG/ML IJ SOLN
1.0000 mg | Freq: Once | INTRAMUSCULAR | Status: AC
  Administered 2018-05-27: 14:00:00 1 mg via INTRAVENOUS
  Filled 2018-05-27: qty 1

## 2018-05-27 MED ORDER — HYDRALAZINE HCL 20 MG/ML IJ SOLN: 10.0000 mg | Freq: Four times a day (QID) | INTRAMUSCULAR | Status: DC | PRN

## 2018-05-27 MED ORDER — PROMETHAZINE HCL 12.5 MG RE SUPP: 12.5000 mg | Freq: Two times a day (BID) | RECTAL | Status: DC | PRN

## 2018-05-27 MED ORDER — POTASSIUM CHLORIDE CRYS ER 20 MEQ PO TBCR
40.0000 meq | EXTENDED_RELEASE_TABLET | Freq: Two times a day (BID) | ORAL | Status: DC
  Administered 2018-05-28: 40 meq via ORAL
  Filled 2018-05-27: qty 2

## 2018-05-27 MED ORDER — ACETAMINOPHEN 650 MG RE SUPP: 650.0000 mg | Freq: Four times a day (QID) | RECTAL | Status: DC | PRN

## 2018-05-27 NOTE — ED Notes (Signed)
Critical potassium 2.7, reported to [facility]

## 2018-05-27 NOTE — ED Notes (Signed)
Admitting at bedside 

## 2018-05-27 NOTE — ED Triage Notes (Signed)
[facility]- pt from home. Pt brought in with complaints of abdominal pain, nausea and vomiting. History of pancreatitis.    [place] gave 4 mg of zofran   150/106 100 HR 100% RA 97.3
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
## 2018-05-28 ENCOUNTER — Other Ambulatory Visit: Payer: Self-pay | Admitting: Family Medicine

## 2018-05-28 DIAGNOSIS — K859 Acute pancreatitis without necrosis or infection, unspecified: Principal | ICD-10-CM

## 2018-05-28 DIAGNOSIS — Z789 Other specified health status: Secondary | ICD-10-CM

## 2018-05-28 LAB — BASIC METABOLIC PANEL
Anion gap: 10 (ref 5–15)
BUN: <5 mg/dL — ABNORMAL LOW (ref 6–20)
CO2: 27 mmol/L (ref 22–32)
Calcium: 9.6 mg/dL (ref 8.9–10.3)
Chloride: 101 mmol/L (ref 98–111)
Creatinine, Ser: 1.17 mg/dL — ABNORMAL HIGH (ref 0.44–1.00)
GFR calc Af Amer: >60 mL/min (ref >60)
GFR calc non Af Amer: 57 mL/min — ABNORMAL LOW (ref >60)
Glucose, Bld: 134 mg/dL — ABNORMAL HIGH (ref 70–99)
Potassium: 3.4 mmol/L — ABNORMAL LOW (ref 3.5–5.1)
Sodium: 138 mmol/L (ref 135–145)

## 2018-05-28 LAB — MAGNESIUM: Magnesium: 2.1 mg/dL (ref 1.7–2.4)

## 2018-05-28 MED ORDER — MEGESTROL ACETATE 40 MG PO TABS
40.0000 mg | ORAL_TABLET | Freq: Two times a day (BID) | ORAL | Status: DC
  Administered 2018-05-28 – 2018-05-29 (×3): 40 mg via ORAL
  Filled 2018-05-28 (×3): qty 1

## 2018-05-28 MED ORDER — SENNOSIDES-DOCUSATE SODIUM 8.6-50 MG PO TABS
1.0000 | ORAL_TABLET | Freq: Two times a day (BID) | ORAL | Status: DC
  Administered 2018-05-28 – 2018-05-29 (×2): 1 via ORAL
  Filled 2018-05-28 (×3): qty 1

## 2018-05-28 MED ORDER — HYDROCODONE-ACETAMINOPHEN 5-325 MG PO TABS
1.0000 | ORAL_TABLET | Freq: Four times a day (QID) | ORAL | Status: DC | PRN
  Administered 2018-05-28 – 2018-05-29 (×4): 1 via ORAL
  Filled 2018-05-28 (×4): qty 1

## 2018-05-28 MED ORDER — POTASSIUM CHLORIDE CRYS ER 20 MEQ PO TBCR
40.0000 meq | EXTENDED_RELEASE_TABLET | Freq: Every day | ORAL | Status: DC
  Administered 2018-05-29: 40 meq via ORAL
  Filled 2018-05-28: qty 2

## 2018-05-28 NOTE — Progress Notes (Signed)
Progress Note    Holly Mendez  FOY:[id number] DOB: December 08, 1975  DOA: 05/27/2018 PCP: Dickie La, MD    Brief Narrative:     Medical records reviewed and are as summarized below:  Holly Mendez is an 43 y.o. female with history h/o chronic pancreatitis (?  Alcohol induced), depression, bipolar disorder, schizoaffective disorder, fibromyalgia, chronic low back pain, polysubstance abuse, drug-seeking history, hypertension, hyperlipidemia presented today with complaints of abdominal pain associated with nausea and several episodes of nonbloody vomiting.  Assessment/Plan:   Active Problems:   Acute pancreatitis   Acute on chronic pancreatitis:  - IV fluids, IV fentanyl as needed for pain for short-term acute pain (does have drug-seeking history) -No history of gallstones.  Denies any recent alcohol use.  Will hold HCTZ for possible medication induced pancreatitis.  No right upper quadrant tenderness on exam and LFTs/recent CT findings assuring. -advance diet today to full liquids   Severe hypokalemia:  -Secondary to diuretics/GI losses.   -Patient does have a history of chronic hypokalemia but also on hydrochlorothiazide at baseline.   -Will hold diuretics -repleted K  constipation -senna  H/o Polysubstance abuse:  -  Check urine for drug screen  Hypertension: Hold HCTZ and resume clonidine.  She reports intolerance to Norvasc.  Treat pain given tachycardia/elevated blood pressure  obesity Body mass index is 34.98 kg/m.   Family Communication/Anticipated D/C date and plan/Code Status   DVT prophylaxis: Lovenox ordered. Code Status: Full Code.  Family Communication:  Disposition Plan: patient to transfer to [facility] in AM as her PCP is [name]      Subjective:   No SOB, no CP  Objective:    Vitals:   05/27/18 1515 05/27/18 2050 05/28/18 0420 05/28/18 0743  BP: (!) 156/105 (!) 180/112 (!) 143/94    Pulse: 96 (!) 111 91 (!) 102  Resp: (!) 22   20  Temp:  99.7 F (37.6 C) 98.9 F (37.2 C)   TempSrc:  Oral Oral   SpO2: 100% 99% 100% 99%  Weight:        Intake/Output Summary (Last 24 hours) at 05/28/2018 1053 Last data filed at 05/28/2018 0600 Gross per 24 hour  Intake 3812.38 ml  Output -  Net 3812.38 ml   Filed Weights   05/27/18 1324  Weight: 95.3 kg    Exam: Walking in room NAD rrr Diminished bowel sounds, mildly tender No LE edema  Data Reviewed:   I have personally reviewed following labs and imaging studies:  Labs: Labs show the following:   Basic Metabolic Panel: Recent Labs  Lab 05/24/18 0634 05/27/18 1402 05/28/18 0919  NA 133* 138 138  K 2.6* 2.7* 3.4*  CL 89* 97* 101  CO2 27 28 27   GLUCOSE 129* 133* 134*  BUN 15 5* <5*  CREATININE 1.89* 1.10* 1.17*  CALCIUM 9.7 9.8 9.6  MG 2.5*  --  2.1   GFR Estimated Creatinine Clearance: 70 mL/min (A) (by C-G formula based on SCr of 1.17 mg/dL (H)). Liver Function Tests: Recent Labs  Lab 05/24/18 0634 05/27/18 1402  AST 22 20  ALT 24 21  ALKPHOS 71 60  BILITOT 0.9 0.6  PROT 7.8 7.6  ALBUMIN 4.5 4.1   Recent Labs  Lab 05/24/18 0634 05/27/18 1402  LIPASE 79* 376*   No results for input(s): AMMONIA in the last 168 hours. Coagulation profile No results for input(s): INR, PROTIME in the last 168 hours.  CBC: Recent Labs  Lab 05/24/18 0634 05/27/18 1402  WBC 8.2 8.5  NEUTROABS  --  5.8  HGB 14.2 13.6  HCT 40.6 40.2  MCV 82.2 82.7  PLT 322 297   Cardiac Enzymes: No results for input(s): CKTOTAL, CKMB, CKMBINDEX, TROPONINI in the last 168 hours. BNP (last 3 results) No results for input(s): PROBNP in the last 8760 hours. CBG: No results for input(s): GLUCAP in the last 168 hours. D-Dimer: No results for input(s): DDIMER in the last 72 hours. Hgb A1c: No results for input(s): HGBA1C in the last 72 hours. Lipid Profile: No results for input(s): CHOL, HDL, LDLCALC, TRIG, CHOLHDL,  LDLDIRECT in the last 72 hours. Thyroid function studies: No results for input(s): TSH, T4TOTAL, T3FREE, THYROIDAB in the last 72 hours.  Invalid input(s): FREET3 Anemia work up: No results for input(s): VITAMINB12, FOLATE, FERRITIN, TIBC, IRON, RETICCTPCT in the last 72 hours. Sepsis Labs: Recent Labs  Lab 05/24/18 0634 05/27/18 1402  WBC 8.2 8.5    Microbiology Recent Results (from the past 240 hour(s))  SARS Coronavirus 2 (CEPHEID - Performed in [place] hospital lab), Hosp Order     Status: None   Collection Time: 05/27/18  4:34 PM  Result Value Ref Range Status   SARS Coronavirus 2 NEGATIVE NEGATIVE Final    Comment: (NOTE) If result is NEGATIVE SARS-CoV-2 target nucleic acids are NOT DETECTED. The SARS-CoV-2 RNA is generally detectable in upper and lower  respiratory specimens during the acute phase of infection. The lowest  concentration of SARS-CoV-2 viral copies this assay can detect is 250  copies / mL. A negative result does not preclude SARS-CoV-2 infection  and should not be used as the sole basis for treatment or other  patient management decisions.  A negative result may occur with  improper specimen collection / handling, submission of specimen other  than nasopharyngeal swab, presence of viral mutation(s) within the  areas targeted by this assay, and inadequate number of viral copies  (<250 copies / mL). A negative result must be combined with clinical  observations, patient history, and epidemiological information. If result is POSITIVE SARS-CoV-2 target nucleic acids are DETECTED. The SARS-CoV-2 RNA is generally detectable in upper and lower  respiratory specimens dur ing the acute phase of infection.  Positive  results are indicative of active infection with SARS-CoV-2.  Clinical  correlation with patient history and other diagnostic information is  necessary to determine patient infection status.  Positive results do  not rule out bacterial infection  or co-infection with other viruses. If result is PRESUMPTIVE POSTIVE SARS-CoV-2 nucleic acids MAY BE PRESENT.   A presumptive positive result was obtained on the submitted specimen  and confirmed on repeat testing.  While 2019 novel coronavirus  (SARS-CoV-2) nucleic acids may be present in the submitted sample  additional confirmatory testing may be necessary for epidemiological  and / or clinical management purposes  to differentiate between  SARS-CoV-2 and other Sarbecovirus currently known to infect humans.  If clinically indicated additional testing with an alternate test  methodology [phone]) is advised. The SARS-CoV-2 RNA is generally  detectable in upper and lower respiratory sp ecimens during the acute  phase of infection. The expected result is Negative. Fact Sheet for Patients:  StrictlyIdeas.no Fact Sheet for Healthcare Providers: BankingDealers.co.za This test is not yet approved or cleared by the [place] FDA and has been authorized for detection and/or diagnosis of SARS-CoV-2 by FDA under an Emergency Use Authorization (EUA).  This EUA will remain in effect (meaning  this test can be used) for the duration of the COVID-19 declaration under Section 564(b)(1) of the Act, 21 U.S.C. section 360bbb-3(b)(1), unless the authorization is terminated or revoked sooner. Performed at [facility], [place] [address] [place], [place] [address]     Procedures and diagnostic studies:  No results found.  Medications:   . [START ON 05/29/2018] cloNIDine  0.1 mg Transdermal Q Wed  . dicyclomine  20 mg Oral BID  . enoxaparin (LOVENOX) injection  40 mg Subcutaneous Q24H  . fluticasone furoate-vilanterol  1 puff Inhalation Daily  . loratadine  10 mg Oral Daily  . lurasidone  60 mg Oral Daily  . polyethylene glycol  17 g Oral Daily  . [START ON 05/29/2018] potassium chloride SA  40 mEq Oral Daily  . senna-docusate  1 tablet Oral  BID   Continuous Infusions: . lactated ringers 125 mL/hr at 05/28/18 0600     LOS: 1 day   Geradine Girt  [facility]   How to contact the [facility] Attending or Consulting provider [place] or covering provider during after hours [place], for this patient?  1. Check the care team in [facility] and look for a) attending/consulting TR[facility] provider listed and b) the [facility] team listed 2. Log into [email or web address] and use [place]'s universal password to access. If you do not have the password, please contact the hospital operator. 3. Locate the [facility] provider you are looking for under [facility] and page to a number that you can be directly reached. 4. If you still have difficulty reaching the provider, please page the [facility] (Director on Call) for the Hospitalists listed on amion for assistance.  05/28/2018, 10:53 AM

## 2018-05-28 NOTE — Progress Notes (Signed)
Received to room 5W28 from ER via stretcher. Assisted to bed and positioned for comfort. Oriented to room, bed and unit.

## 2018-05-29 LAB — LIPID PANEL
Cholesterol: 177 mg/dL (ref 0–200)
HDL: 39 mg/dL — ABNORMAL LOW
LDL Cholesterol: 113 mg/dL — ABNORMAL HIGH (ref 0–99)
Total CHOL/HDL Ratio: 4.5 ratio
Triglycerides: 124 mg/dL
VLDL: 25 mg/dL (ref 0–40)

## 2018-05-29 LAB — BASIC METABOLIC PANEL WITH GFR
Anion gap: 11 (ref 5–15)
BUN: <5 mg/dL — ABNORMAL LOW (ref 6–20)
CO2: 25 mmol/L (ref 22–32)
Calcium: 9.5 mg/dL (ref 8.9–10.3)
Chloride: 102 mmol/L (ref 98–111)
Creatinine, Ser: 1.14 mg/dL — ABNORMAL HIGH (ref 0.44–1.00)
GFR calc Af Amer: >60 mL/min
GFR calc non Af Amer: 58 mL/min — ABNORMAL LOW
Glucose, Bld: 148 mg/dL — ABNORMAL HIGH (ref 70–99)
Potassium: 3.4 mmol/L — ABNORMAL LOW (ref 3.5–5.1)
Sodium: 138 mmol/L (ref 135–145)

## 2018-05-29 NOTE — Progress Notes (Addendum)
Patient left AMA. Staff explained to the patient the consequences of leaving AMA but she signed the paper and she left. She said that she can't stay more then 12 o'clock. IV D/C. MD notified.

## 2018-05-29 NOTE — Discharge Summary (Signed)
[facility] Discharge Summary  Patient name: Holly Mendez Medical record number: [id number] Date of birth: Jan 31, 1975 Age: 43 y.o. Gender: female Date of Admission: 05/27/2018  Date of Discharge: 05/29/18 Admitting Physician: Guilford Shi, MD  Primary Care Provider: Dickie La, MD Consultants: None  Indication for Hospitalization: Abdominal Pain  Discharge Diagnoses/Problem List:  Acute on chronic pancreatitis Hypokalemia Bipolar disorder Schizoaffective disorder Polysubstance abuse HTN  Disposition: Home  Discharge Condition: Stable  Discharge Exam:  General: pleasant african american lady, well nourished, well developed, in no acute distress with non-toxic appearance, sitting up comfortably in bed, figgity  HEENT: normocephalic, atraumatic, moist mucous membranes Neck: supple, normal ROM CV: regular rate and rhythm without murmurs, rubs, or gallops, no lower extremity edema, 2+ radial and pedal pulses bilaterally Lungs: clear to auscultation bilaterally with normal work of breathing Abdomen: soft, mildly tender especially in epigastric area, non-distended, normoactive bowel sounds Skin: warm, dry Extremities: warm and well perfused Neuro: Alert and oriented, speech normal  Brief Hospital Course:  Holly Mendez is a 43 y.o. female with past medical history significant for one episode of chronic pancreatitis, depression, bipolar disorder, schizoaffective disorder, fibromyalgia, chronic low back pain, polysubstance abuse, drug-seeking history, HTN, and HLD, who presented with worsening abdominal pain and found to have acute on chronic pacreatitis. Patient was initially seen in ED on 5/14 for abdominal pain but Lipase WNL and CT abdomen normal. She returned on 5/18 with worsening abdominal pain and elevated lipase to 376. She was treated with IV fluids and pain medicine with improvement in symptoms. Cause of her acute  pancreatitis is unclear but thought to be medication induced (possibly HCTZ), given normal Triglycerides, no gallstones on imaging, and negative alcohol for >1 month. On the morning of 5/20 patient noted her abdominal pain was back to her baseline pain. She was tolerating a soft diet without nausea or vomiting. She noted that her chronic abdominal pain may be secondary to severe stressors occurring at home. Prior to patient being discharge, patient opted to leave AMA.   Issues for Follow Up:  1. Patient described burning epigastric pain not relieved with Nexium. May benefit from H2 blocker or trying another PPI. 2. Consider transition to new hypertension medication given risk of HCTZ for pacreatitis.  Significant Procedures: None  Significant Labs and Imaging:  Recent Labs  Lab 05/24/18 0634 05/27/18 1402  WBC 8.2 8.5  HGB 14.2 13.6  HCT 40.6 40.2  PLT 322 297   Recent Labs  Lab 05/24/18 0634 05/27/18 1402 05/28/18 0919 05/29/18 0333  NA 133* 138 138 138  K 2.6* 2.7* 3.4* 3.4*  CL 89* 97* 101 102  CO2 27 28 27 25   GLUCOSE 129* 133* 134* 148*  BUN 15 5* <5* <5*  CREATININE 1.89* 1.10* 1.17* 1.14*  CALCIUM 9.7 9.8 9.6 9.5  MG 2.5*  --  2.1  --   ALKPHOS 71 60  --   --   AST 22 20  --   --   ALT 24 21  --   --   ALBUMIN 4.5 4.1  --   --     Lipase 376 COVID neg  Results/Tests Pending at Time of Discharge: None No results found.  Mina Marble [place], DO 05/29/2018, 2:31 PM PGY-1, [place]

## 2018-05-29 NOTE — Plan of Care (Signed)
Pt transferred to family medicine teaching service, discussed with Dr. Kris Mouton, attending physician Dr. Shon Hough.   Estill Cotta M.D. [facility] 05/29/2018, 7:33 AM

## 2018-05-29 NOTE — Progress Notes (Signed)
Patient noted to have disconnected IV tubing and Iv fluids dripping down.  Pt was receiving LR @ 125 cc/hr per order. This writer attempted to hook a new IV tubing by pt refused . Pt stated she  won'tneed any more IV fluids and would rather have oral hydration

## 2018-05-29 NOTE — Progress Notes (Signed)
Patient requested to be discharged no later then 12 o'clock. RN notified MD. Will continue to monitor.

## 2018-05-30 LAB — URINE DRUGS OF ABUSE SCREEN W ALC, ROUTINE (REF LAB)
Amphetamines, Urine: NEGATIVE ng/mL
Barbiturate, Ur: NEGATIVE ng/mL
Benzodiazepine Quant, Ur: NEGATIVE ng/mL
Cannabinoid Quant, Ur: NEGATIVE ng/mL
Cocaine (Metab.): NEGATIVE ng/mL
Ethanol U, Quan: NEGATIVE %
Methadone Screen, Urine: NEGATIVE ng/mL
Opiate Quant, Ur: NEGATIVE ng/mL
Phencyclidine, Ur: NEGATIVE ng/mL
Propoxyphene, Urine: NEGATIVE ng/mL

## 2018-06-04 ENCOUNTER — Other Ambulatory Visit: Payer: Self-pay | Admitting: Family Medicine

## 2018-06-05 ENCOUNTER — Other Ambulatory Visit: Payer: Self-pay | Admitting: Family Medicine

## 2018-06-06 ENCOUNTER — Telehealth: Payer: Self-pay | Admitting: Family Medicine

## 2018-06-06 NOTE — Telephone Encounter (Signed)
Pt would like to have her Phenegran 25 mg suppository refilled.

## 2018-06-10 ENCOUNTER — Other Ambulatory Visit: Payer: Self-pay | Admitting: Family Medicine

## 2018-06-10 MED ORDER — PROMETHAZINE HCL 25 MG RE SUPP: RECTAL | 1 refills | Status: DC

## 2018-06-11 ENCOUNTER — Telehealth: Payer: Self-pay

## 2018-06-17 ENCOUNTER — Telehealth: Payer: Self-pay | Admitting: Family Medicine

## 2018-06-17 ENCOUNTER — Other Ambulatory Visit: Payer: Self-pay | Admitting: Family Medicine

## 2018-06-17 NOTE — Telephone Encounter (Signed)
Patient called and stated she needed to have an appointment with Dr Kennon Rounds to discuss surgery options. She was having some issues with the last scheduled surgery, and she never had the surgery. When I informed her about MyChart. She stated her and her husband do know anything about computers. All they can do is make phone calls, and answer phone calls.

## 2018-06-24 NOTE — Telephone Encounter (Signed)
Opened in error

## 2018-07-04 ENCOUNTER — Encounter: Payer: Self-pay | Admitting: Family Medicine

## 2018-07-04 ENCOUNTER — Other Ambulatory Visit: Payer: Self-pay

## 2018-07-04 ENCOUNTER — Ambulatory Visit (INDEPENDENT_AMBULATORY_CARE_PROVIDER_SITE_OTHER): Payer: Medicaid Other | Admitting: Family Medicine

## 2018-07-04 DIAGNOSIS — D250 Submucous leiomyoma of uterus: Secondary | ICD-10-CM

## 2018-07-04 DIAGNOSIS — N920 Excessive and frequent menstruation with regular cycle: Secondary | ICD-10-CM

## 2018-07-04 NOTE — Assessment & Plan Note (Signed)
Causing her bleeding. For TVH, with bilateral salpingectomy. Note sent to scheduler. Emphasized sobriety and optimizing health status prior to surgery.
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
## 2018-07-10 ENCOUNTER — Other Ambulatory Visit: Payer: Self-pay

## 2018-07-10 ENCOUNTER — Emergency Department (HOSPITAL_COMMUNITY)
Admission: EM | Admit: 2018-07-10 | Discharge: 2018-07-10 | Disposition: A | Discharge status: Home / Self Care | Payer: Medicaid Other | Attending: Emergency Medicine | Admitting: Emergency Medicine

## 2018-07-10 DIAGNOSIS — J45909 Unspecified asthma, uncomplicated: Secondary | ICD-10-CM | POA: Insufficient documentation

## 2018-07-10 DIAGNOSIS — Z87891 Personal history of nicotine dependence: Secondary | ICD-10-CM | POA: Insufficient documentation

## 2018-07-10 DIAGNOSIS — I10 Essential (primary) hypertension: Secondary | ICD-10-CM | POA: Diagnosis not present

## 2018-07-10 DIAGNOSIS — Z79899 Other long term (current) drug therapy: Secondary | ICD-10-CM | POA: Insufficient documentation

## 2018-07-10 DIAGNOSIS — R112 Nausea with vomiting, unspecified: Secondary | ICD-10-CM | POA: Insufficient documentation

## 2018-07-10 LAB — COMPREHENSIVE METABOLIC PANEL
ALT: 26 U/L (ref 0–44)
AST: 29 U/L (ref 15–41)
Albumin: 4.6 g/dL (ref 3.5–5.0)
Alkaline Phosphatase: 84 U/L (ref 38–126)
Anion gap: 12 (ref 5–15)
BUN: 14 mg/dL (ref 6–20)
CO2: 23 mmol/L (ref 22–32)
Calcium: 10 mg/dL (ref 8.9–10.3)
Chloride: 106 mmol/L (ref 98–111)
Creatinine, Ser: 1.02 mg/dL — ABNORMAL HIGH (ref 0.44–1.00)
GFR calc Af Amer: >60 mL/min (ref >60)
GFR calc non Af Amer: >60 mL/min (ref >60)
Glucose, Bld: 158 mg/dL — ABNORMAL HIGH (ref 70–99)
Potassium: 3.7 mmol/L (ref 3.5–5.1)
Sodium: 141 mmol/L (ref 135–145)
Total Bilirubin: 0.9 mg/dL (ref 0.3–1.2)
Total Protein: 9 g/dL — ABNORMAL HIGH (ref 6.5–8.1)

## 2018-07-10 LAB — URINALYSIS, ROUTINE W REFLEX MICROSCOPIC
Bilirubin Urine: NEGATIVE
Glucose, UA: NEGATIVE mg/dL
Ketones, ur: 20 mg/dL — AB
Leukocytes,Ua: NEGATIVE
Nitrite: NEGATIVE
Protein, ur: 100 mg/dL — AB
Specific Gravity, Urine: 1.031 — ABNORMAL HIGH (ref 1.005–1.030)
pH: 6 (ref 5.0–8.0)

## 2018-07-10 LAB — CBC
HCT: 38.7 % (ref 36.0–46.0)
Hemoglobin: 13.1 g/dL (ref 12.0–15.0)
MCH: 28.3 pg (ref 26.0–34.0)
MCHC: 33.9 g/dL (ref 30.0–36.0)
MCV: 83.6 fL (ref 80.0–100.0)
Platelets: 359 10*3/uL (ref 150–400)
RBC: 4.63 MIL/uL (ref 3.87–5.11)
RDW: 14.5 % (ref 11.5–15.5)
WBC: 8.7 10*3/uL (ref 4.0–10.5)
nRBC: 0 % (ref 0.0–0.2)

## 2018-07-10 LAB — LIPASE, BLOOD: Lipase: 41 U/L (ref 11–51)

## 2018-07-10 LAB — I-STAT BETA HCG BLOOD, ED (MC, WL, AP ONLY): I-stat hCG, quantitative: <5 m[IU]/mL (ref <5)

## 2018-07-10 LAB — RAPID URINE DRUG SCREEN, HOSP PERFORMED
Amphetamines: NOT DETECTED
Barbiturates: NOT DETECTED
Benzodiazepines: NOT DETECTED
Cocaine: POSITIVE — AB
Opiates: POSITIVE — AB
Tetrahydrocannabinol: NOT DETECTED

## 2018-07-10 MED ORDER — SODIUM CHLORIDE 0.9% FLUSH
3.0000 mL | Freq: Once | INTRAVENOUS | Status: AC
  Administered 2018-07-10: 3 mL via INTRAVENOUS

## 2018-07-10 MED ORDER — PROMETHAZINE HCL 25 MG RE SUPP: 25.0000 mg | Freq: Four times a day (QID) | RECTAL | 0 refills | Status: DC | PRN

## 2018-07-10 MED ORDER — HALOPERIDOL LACTATE 5 MG/ML IJ SOLN
2.0000 mg | Freq: Once | INTRAMUSCULAR | Status: AC
  Administered 2018-07-10: 2 mg via INTRAVENOUS
  Filled 2018-07-10: qty 1

## 2018-07-10 MED ORDER — SODIUM CHLORIDE 0.9 % IV BOLUS
2000.0000 mL | Freq: Once | INTRAVENOUS | Status: AC
  Administered 2018-07-10: 07:00:00 2000 mL via INTRAVENOUS

## 2018-07-10 MED ORDER — METOCLOPRAMIDE HCL 10 MG PO TABS: 10.0000 mg | ORAL_TABLET | Freq: Three times a day (TID) | ORAL | 0 refills | Status: DC | PRN

## 2018-07-10 MED ORDER — METOCLOPRAMIDE HCL 5 MG/ML IJ SOLN
10.0000 mg | Freq: Once | INTRAMUSCULAR | Status: AC
  Administered 2018-07-10: 10 mg via INTRAVENOUS
  Filled 2018-07-10: qty 2

## 2018-07-10 NOTE — ED Notes (Signed)
Bed: WA10 Expected date: 07/10/18 Expected time: 5:49 AM Means of arrival:  Comments: 43 yr old abd pain

## 2018-07-10 NOTE — ED Triage Notes (Addendum)
[facility] transported pt to [facility] ED and reports the following:   Pt actively vomiting, shaking, and yelling when EMS arrived. Emesis appeared to be bile.
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
## 2018-07-11 ENCOUNTER — Telehealth: Payer: Self-pay

## 2018-07-11 NOTE — Telephone Encounter (Signed)
Called pt to see when can she come in to sign Hysterectomy Statement, she stated just got out of the hospital today, asked if she could come in Monday, she said yes. Advised her to ask for Pam to sign form.Pt verbalized understanding.

## 2018-07-18 ENCOUNTER — Other Ambulatory Visit: Payer: Self-pay | Admitting: Obstetrics & Gynecology

## 2018-07-18 DIAGNOSIS — N920 Excessive and frequent menstruation with regular cycle: Secondary | ICD-10-CM

## 2018-07-18 DIAGNOSIS — D250 Submucous leiomyoma of uterus: Secondary | ICD-10-CM

## 2018-07-23 ENCOUNTER — Telehealth: Payer: Self-pay | Admitting: *Deleted

## 2018-07-23 NOTE — Telephone Encounter (Signed)
Pt is requesting a medication for acid reflux and nausea (but not zofran).  States that her pancreas is "flaring up" and she needs the meds. Christen Bame, CMA

## 2018-07-24 ENCOUNTER — Other Ambulatory Visit: Payer: Self-pay

## 2018-07-24 ENCOUNTER — Telehealth (INDEPENDENT_AMBULATORY_CARE_PROVIDER_SITE_OTHER): Payer: Medicaid Other | Admitting: Family Medicine

## 2018-07-24 DIAGNOSIS — R109 Unspecified abdominal pain: Secondary | ICD-10-CM

## 2018-07-24 MED ORDER — ESOMEPRAZOLE MAGNESIUM 40 MG PO CPDR: 40.0000 mg | DELAYED_RELEASE_CAPSULE | Freq: Every day | ORAL | 0 refills | Status: DC

## 2018-07-24 MED ORDER — PROMETHAZINE HCL 25 MG RE SUPP: 25.0000 mg | Freq: Two times a day (BID) | RECTAL | 0 refills | Status: DC | PRN

## 2018-07-24 NOTE — Assessment & Plan Note (Signed)
Flare of this with large component of reflux likely due to being out of PPI.  Refilled nexium and phenergan.  Suggested trying tums OTC in addition.  If not improving would need to be seen in person.  Recommend make follow up with PCP for several weeks for weight check.  She agrees

## 2018-07-24 NOTE — Progress Notes (Signed)
[facility] Tele[facility] Visit  Patient consented to have virtual visit. Method of visit: Telephone  Encounter participants: Patient: Holly Mendez - located at home Provider: Lind Covert - located at office Others (if applicable): no  Chief Complaint: Heart Burn   HPI: Having flare of heart burn and episodes of nausea that she has relatively regularly.  Has been able to keep down liquids but not solids for several days.  No bleeding or fever or severe abdomen pain Has been out of nexium and phenergan supp.  Finished the reglan given by the ER   ROS: per HPI  Pertinent PMHx: chronic abdomen pain   Exam:  Respiratory: normal speech pattern Psych:  Cognition and judgment appear intact. Alert, communicative  and cooperative with normal attention span and concentration. No apparent delusions, illusions, hallucinations   Assessment/Plan:  Intractable abdominal pain Flare of this with large component of reflux likely due to being out of PPI.  Refilled nexium and phenergan.  Suggested trying tums OTC in addition.  If not improving would need to be seen in person.  Recommend make follow up with PCP for several weeks for weight check.  She agrees     Time spent during visit with patient: 11 minutes

## 2018-07-24 NOTE — Telephone Encounter (Signed)
Pt has appointment today at 10:10am with Dr. Erin Hearing. April Zimmerman Rumple, CMA

## 2018-07-24 NOTE — Telephone Encounter (Signed)
Please contact patient and offer her an appointment - virtual or in person  Thanks  Truman Hayward

## 2018-07-25 ENCOUNTER — Other Ambulatory Visit: Payer: Self-pay | Admitting: Family Medicine

## 2018-07-28 ENCOUNTER — Telehealth: Payer: Self-pay | Admitting: Family Medicine

## 2018-07-28 NOTE — Telephone Encounter (Signed)
**  After-hours emergency call**  Patient endorses a one day history of severe nausea and vomiting after eating 30-day expired eggs (realized after eating) this morning after church.  States she started feeling quite nauseous soon after.  Since that time, states she has been vomiting approximately every 30 minutes, greenish cloudy color and foul taste.  Subjective fever with chills, however is not checked with thermometer.  Denies any change in bowel movements, last bowel movement yesterday and has passed gas.  No associated abdominal pains, a little cramping with vomiting.   She has a history of chronic pancreatitis, states this does not seem like her previous flares.  Also note she had a telemedicine visit with our office a few days ago for abdominal pains, states this improved.  Since this started earlier today, she has not been able to keep down any fluids or eat anything.  She has been using Zofran and Phenergan rectally, states the Zofran has not been able to stay down.  A/p:  Likely self-limited gastro-enteritis, could consider Salmonella due to eating expired eggs. She also describes a bile emesis which could be concerning for SBO/gallbladder etiology, however suspect is likely in the setting of completely empty stomach. Discussed with patient my concerns for dehydration and her inability to keep any fluids down despite antiemetics with bile emesis, thus recommended and encouraged further evaluation in the ED for fluid resuscitation and IV antinausea medications. However, patient feels that she continue to manage this at home and would seek further evaluation if this continues into the morning.  It has been a few hours since her last antinausea medication, recommended the rectal Phenergan when due for a better chance to remain within her system.  Additionally discussed taking small sips over time of fluids (water, Gatorade, diluted apple juice etc.) rather than large gulps.   Patriciaann Clan, DO

## 2018-07-29 ENCOUNTER — Telehealth: Payer: Self-pay | Admitting: Obstetrics and Gynecology
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
## 2018-07-30 ENCOUNTER — Other Ambulatory Visit: Payer: Self-pay

## 2018-07-30 ENCOUNTER — Other Ambulatory Visit: Payer: Self-pay | Admitting: Family Medicine

## 2018-07-30 MED ORDER — AMITRIPTYLINE HCL 25 MG PO TABS: ORAL_TABLET | ORAL | 1 refills | Status: DC

## 2018-07-30 NOTE — Telephone Encounter (Signed)
Pt also requested Vit D but I did not see this on pt med list. Holly Mendez, [place]

## 2018-07-30 NOTE — Telephone Encounter (Signed)
Pt is calling to request a refill on her Vitamin D and Amitriptyline jw

## 2018-08-01 ENCOUNTER — Telehealth: Payer: Self-pay | Admitting: Family Medicine

## 2018-08-01 NOTE — Telephone Encounter (Signed)
Spoke with patient about her appointment on 7/24 @ 9:15. Patient stated that she is not feeling will. Informed patient since she is not feeling well I will have to have her appointment for 7/24 canceled and get her rescheduled. Patient stated that she will give the office a call back when she is better to have the appointment reschedule. The appointment on 7/24 was canceled

## 2018-08-02 ENCOUNTER — Ambulatory Visit: Payer: Medicaid Other | Admitting: Medical

## 2018-08-02 ENCOUNTER — Ambulatory Visit: Payer: Medicaid Other

## 2018-08-12 ENCOUNTER — Other Ambulatory Visit: Payer: Self-pay | Admitting: Family Medicine

## 2018-08-13 ENCOUNTER — Other Ambulatory Visit: Payer: Self-pay | Admitting: Family Medicine

## 2018-08-13 DIAGNOSIS — Z1231 Encounter for screening mammogram for malignant neoplasm of breast: Secondary | ICD-10-CM

## 2018-08-15 ENCOUNTER — Other Ambulatory Visit: Payer: Self-pay | Admitting: Family Medicine
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
## 2018-08-22 ENCOUNTER — Other Ambulatory Visit: Payer: Self-pay

## 2018-08-22 ENCOUNTER — Telehealth (INDEPENDENT_AMBULATORY_CARE_PROVIDER_SITE_OTHER): Payer: Medicaid Other | Admitting: Family Medicine

## 2018-08-22 ENCOUNTER — Telehealth: Payer: Self-pay | Admitting: *Deleted

## 2018-08-22 DIAGNOSIS — A599 Trichomoniasis, unspecified: Secondary | ICD-10-CM | POA: Diagnosis not present

## 2018-08-22 MED ORDER — METRONIDAZOLE 500 MG PO TABS: 500.0000 mg | ORAL_TABLET | Freq: Two times a day (BID) | ORAL | 0 refills | Status: DC

## 2018-08-22 NOTE — Telephone Encounter (Signed)
Pt calls because she was recently with a partner and was informed that he has trichomonas.  She is requesting something to be called in, will forward to MD.  Christen Bame, CMA

## 2018-08-24 NOTE — Progress Notes (Signed)
[place] Telemedicine Visit  Patient consented to have visit conducted via telephone/video phone call. Two separate patient identifiers used to verify identity.  Encounter participants: Patient: Holly Mendez  Provider: [name]  Others (if applicable): none  Chief Complaint / HPI:  Notified by a sex partner she was exposed to [place] She has no symptoms other than some vaginal discharge Does not want to come in for complete STI evaluation  ROS: No fever No productive cough.  No chest pain.  No unusual weight change No abdominal pain  OBJECTIVE observed via telephone device: PSYCH: AxOx4.  Appropriate speech fluency and content. Asks and answers questions appropriately. Mood is congruent. RESPIRATORY: no unusual sounds of labored breathing    Pertinent PMHx / PSHx:  I have reviewed the patient's medications, allergies, past medical and surgical history, smoking status and updated in the EMR as appropriate.   Assessment/Plan:  STI Will treat for suspected trichomoniasis vagin\alis She is asymptomatic except for vaginal \\discharge  and that should resolve with treatment If it does not or if she develops other symptoms, she needs complete STI evaluation and we discussed Time spent on phone or on video call with patient:15 minutes

## 2018-08-27 NOTE — Progress Notes (Signed)
[place], [place] - [address] [place] [address] Phone: [phone] Fax: [place], [place] [address] Phone: [phone] Fax: [phone]      Your procedure is scheduled on August 25  Report to [facility] Main Entrance "A" at 0530 A.M., and check in at the Admitting office.  Call this number if you have problems the morning of surgery:  [phone]  Call [phone] if you have any questions prior to your surgery date Monday-Friday 8am-4pm    Remember:  Do not eat after midnight the night before your surgery  You may drink clear liquids until 0430 the morning of your surgery.   Clear liquids allowed are: Water, Non-Citrus Juices (without pulp), Carbonated Beverages, Clear Tea, Black Coffee Only, and Gatorade    Take these medicines the morning of surgery with A SIP OF WATER  albuterol (PROAIR HFA) - Please bring all inhalers with you the day of surgery.  budesonide-formoterol (SYMBICORT) cloNIDine (CATAPRES - DOSED IN MG/24 HR) esomeprazole (NEXIUM) HYDROcodone-acetaminophen (NORCO) if needed for pain LATUDA metroNIDAZOLE (FLAGYL)  ondansetron (ZOFRAN-ODT) if needed   7 days prior to surgery STOP taking any Aspirin (unless otherwise instructed by your surgeon), Aleve, Naproxen, Ibuprofen, Motrin, Advil, Goody's, BC's, all herbal medications, fish oil, and all vitamins.    The Morning of Surgery  Do not wear jewelry, make-up or nail polish.  Do not wear lotions, powders, or perfumes/colognes, or deodorant  Do not shave 48 hours prior to surgery.    Do not bring valuables to the hospital.  [facility] is not responsible for any belongings or valuables.  If you are a smoker, DO NOT Smoke 24 hours prior to surgery IF you wear a CPAP at night please bring your mask, tubing, and machine the morning of surgery   Remember  that you must have someone to transport you home after your surgery, and remain with you for 24 hours if you are discharged the same day.   Contacts, glasses, hearing aids, dentures or bridgework may not be worn into surgery.    Leave your suitcase in the car.  After surgery it may be brought to your room.  For patients admitted to the hospital, discharge time will be determined by your treatment team.  Patients discharged the day of surgery will not be allowed to drive home.    Special instructions:   [facility]- Preparing For Surgery  Before surgery, you can play an important role. Because skin is not sterile, your skin needs to be as free of germs as possible. You can reduce the number of germs on your skin by washing with CHG (chlorahexidine gluconate) Soap before surgery.  CHG is an antiseptic cleaner which kills germs and bonds with the skin to continue killing germs even after washing.    Oral Hygiene is also important to reduce your risk of infection.  Remember - BRUSH YOUR TEETH THE MORNING OF SURGERY WITH YOUR REGULAR TOOTHPASTE  Please do not use if you have an allergy to CHG or antibacterial soaps. If your skin becomes reddened/irritated stop using the CHG.  Do not shave (including legs and underarms) for at least 48 hours prior to first CHG shower. It is OK to shave your face.  Please follow these instructions carefully.   1. Shower the NIGHT BEFORE SURGERY and the MORNING OF SURGERY with CHG Soap.  2. If you chose to wash your hair, wash your hair first as usual with your normal shampoo.  3. After you shampoo, rinse your hair and body thoroughly to remove the shampoo.  4. Use CHG as you would any other liquid soap. You can apply CHG directly to the skin and wash gently with a scrungie or a clean washcloth.   5. Apply the CHG Soap to your body ONLY FROM THE NECK DOWN.  Do not use on open wounds or open sores. Avoid contact with your eyes, ears, mouth and genitals  (private parts). Wash Face and genitals (private parts)  with your normal soap.   6. Wash thoroughly, paying special attention to the area where your surgery will be performed.  7. Thoroughly rinse your body with warm water from the neck down.  8. DO NOT shower/wash with your normal soap after using and rinsing off the CHG Soap.  9. Pat yourself dry with a CLEAN TOWEL.  10. Wear CLEAN PAJAMAS to bed the night before surgery, wear comfortable clothes the morning of surgery  11. Place CLEAN SHEETS on your bed the night of your first shower and DO NOT SLEEP WITH PETS.    Day of Surgery:  Do not apply any deodorants/lotions. Please shower the morning of surgery with the CHG soap  Please wear clean clothes to the hospital/surgery center.   Remember to brush your teeth WITH YOUR REGULAR TOOTHPASTE.   Please read over the following fact sheets that you were given.

## 2018-08-28 ENCOUNTER — Inpatient Hospital Stay (HOSPITAL_COMMUNITY)
Admission: RE | Admit: 2018-08-28 | Discharge: 2018-08-28 | Disposition: A | Discharge status: Home / Self Care | Payer: Medicaid Other | Source: Ambulatory Visit

## 2018-08-30 ENCOUNTER — Inpatient Hospital Stay (HOSPITAL_COMMUNITY): Admission: RE | Admit: 2018-08-30 | Payer: Medicaid Other | Source: Ambulatory Visit

## 2018-09-03 ENCOUNTER — Telehealth: Payer: Self-pay | Admitting: Family Medicine

## 2018-09-03 ENCOUNTER — Encounter (HOSPITAL_COMMUNITY): Admission: RE | Payer: Self-pay | Source: Home / Self Care

## 2018-09-03 ENCOUNTER — Ambulatory Visit (HOSPITAL_COMMUNITY): Admission: RE | Admit: 2018-09-03 | Payer: Medicaid Other | Source: Home / Self Care | Admitting: Family Medicine

## 2018-09-03 SURGERY — HYSTERECTOMY, VAGINAL
Anesthesia: Choice | Laterality: Bilateral

## 2018-09-03 NOTE — Telephone Encounter (Signed)
Patient was called and informed about her appointment to see Dr Kennon Rounds.

## 2018-09-04 ENCOUNTER — Encounter: Payer: Self-pay | Admitting: Family Medicine

## 2018-09-04 ENCOUNTER — Other Ambulatory Visit: Payer: Self-pay

## 2018-09-04 ENCOUNTER — Ambulatory Visit (INDEPENDENT_AMBULATORY_CARE_PROVIDER_SITE_OTHER): Payer: Medicaid Other | Admitting: Family Medicine

## 2018-09-04 DIAGNOSIS — Z23 Encounter for immunization: Secondary | ICD-10-CM

## 2018-09-04 DIAGNOSIS — M65311 Trigger thumb, right thumb: Secondary | ICD-10-CM

## 2018-09-05 ENCOUNTER — Other Ambulatory Visit: Payer: Self-pay | Admitting: Family Medicine

## 2018-09-05 ENCOUNTER — Encounter: Payer: Self-pay | Admitting: Family Medicine

## 2018-09-05 DIAGNOSIS — M65311 Trigger thumb, right thumb: Secondary | ICD-10-CM | POA: Insufficient documentation

## 2018-09-05 NOTE — Progress Notes (Signed)
    CHIEF COMPLAINT / HPI: Right thumb pain and triggering Started about 3 or 4 weeks ago.  Has gradually gotten worse so now she can barely bend it without having extreme pain and it locking in the flexed position.  Has never had this before.  Has not been doing anything unusual as far as activity lately  REVIEW OF SYSTEMS: No other unusual arthralgias, no fever, no cough.  No loss of sensation in her right hand.  PERTINENT  PMH / PSH: I have reviewed the patient's medications, allergies, past medical and surgical history, smoking status and updated in the EMR as appropriate.   OBJECTIVE: GENERAL: Well-developed female no acute distress HAND: Right: Tender to palpation over the A1 pulley of the right thumb.  It sticks in flexion and I can feel the nodule on palpation.  The IP joint is nontender to palpation.  The rest of her hand exam is normal.  PROCEDURE: INJECTION: Patient was given informed consent, signed copy in the chart. Appropriate time out was taken. Area prepped and draped in usual sterile fashion. Ethyl chloride was  used for local anesthesia. A 21 gauge 1 1/2 inch needle was used.. One half cc of methylprednisolone 40 mg/ml plus one half cc of 1% lidocaine without epinephrine was injected into the right A1 pulley area of the right thumb using a(n) volar approach.   The patient tolerated the procedure well. There were no complications. Post procedure instructions were given.   ASSESSMENT / PLAN:   No problem-specific Assessment & Plan notes found for this encounter.

## 2018-09-05 NOTE — Assessment & Plan Note (Signed)
Discussed trigger thumb at length.  Options considered.  She decided to try corticosteroid injection today.  If no improvement in the next 2 to 3 weeks, she will let me know and I will refer her to hand surgeon.

## 2018-09-12 ENCOUNTER — Other Ambulatory Visit: Payer: Self-pay | Admitting: Family Medicine

## 2018-09-12 MED ORDER — CETIRIZINE HCL 10 MG PO TABS: 10.0000 mg | ORAL_TABLET | Freq: Every day | ORAL | 11 refills | Status: DC

## 2018-09-12 NOTE — Telephone Encounter (Signed)
Dear [name] Team I am not sure they will pay for another shower chair. Depends on when she got it. Please tell her to call her home health company--sometimes they will come out and fix it, sometimes they will replace. I will call in her zyrtec THANKS! [name]

## 2018-09-12 NOTE — Telephone Encounter (Signed)
Pt informed of below.April Zimmerman Rumple, CMA ? ?

## 2018-09-12 NOTE — Telephone Encounter (Signed)
Pt is calling for a refill on her Zyrtec and also her shower chair that she received from [facility] broke. She would like to have a new prescription sent to [facility] for a new one. jw

## 2018-09-15 ENCOUNTER — Observation Stay (HOSPITAL_COMMUNITY)
Admission: EM | Admit: 2018-09-15 | Discharge: 2018-09-16 | Disposition: A | Discharge status: Home / Self Care | Payer: Medicaid Other | Attending: Family Medicine | Admitting: Family Medicine

## 2018-09-15 ENCOUNTER — Encounter (HOSPITAL_COMMUNITY): Payer: Self-pay | Admitting: Emergency Medicine

## 2018-09-15 ENCOUNTER — Other Ambulatory Visit: Payer: Self-pay

## 2018-09-15 DIAGNOSIS — E785 Hyperlipidemia, unspecified: Secondary | ICD-10-CM | POA: Diagnosis not present

## 2018-09-15 DIAGNOSIS — Z0184 Encounter for antibody response examination: Secondary | ICD-10-CM | POA: Insufficient documentation

## 2018-09-15 DIAGNOSIS — R112 Nausea with vomiting, unspecified: Secondary | ICD-10-CM | POA: Diagnosis present

## 2018-09-15 DIAGNOSIS — R7303 Prediabetes: Secondary | ICD-10-CM | POA: Diagnosis not present

## 2018-09-15 DIAGNOSIS — Z79899 Other long term (current) drug therapy: Secondary | ICD-10-CM | POA: Diagnosis not present

## 2018-09-15 DIAGNOSIS — R000 Tachycardia, unspecified: Secondary | ICD-10-CM | POA: Diagnosis not present

## 2018-09-15 DIAGNOSIS — E7800 Pure hypercholesterolemia, unspecified: Secondary | ICD-10-CM | POA: Diagnosis not present

## 2018-09-15 DIAGNOSIS — J45909 Unspecified asthma, uncomplicated: Secondary | ICD-10-CM | POA: Diagnosis not present

## 2018-09-15 DIAGNOSIS — F319 Bipolar disorder, unspecified: Secondary | ICD-10-CM | POA: Diagnosis not present

## 2018-09-15 DIAGNOSIS — Z765 Malingerer [conscious simulation]: Secondary | ICD-10-CM | POA: Diagnosis not present

## 2018-09-15 DIAGNOSIS — F259 Schizoaffective disorder, unspecified: Secondary | ICD-10-CM | POA: Insufficient documentation

## 2018-09-15 DIAGNOSIS — Z87891 Personal history of nicotine dependence: Secondary | ICD-10-CM | POA: Diagnosis not present

## 2018-09-15 DIAGNOSIS — F419 Anxiety disorder, unspecified: Secondary | ICD-10-CM | POA: Insufficient documentation

## 2018-09-15 DIAGNOSIS — M47816 Spondylosis without myelopathy or radiculopathy, lumbar region: Secondary | ICD-10-CM | POA: Insufficient documentation

## 2018-09-15 DIAGNOSIS — I10 Essential (primary) hypertension: Secondary | ICD-10-CM | POA: Insufficient documentation

## 2018-09-15 DIAGNOSIS — K861 Other chronic pancreatitis: Principal | ICD-10-CM | POA: Insufficient documentation

## 2018-09-15 DIAGNOSIS — H5462 Unqualified visual loss, left eye, normal vision right eye: Secondary | ICD-10-CM | POA: Diagnosis not present

## 2018-09-15 DIAGNOSIS — M545 Low back pain: Secondary | ICD-10-CM | POA: Diagnosis not present

## 2018-09-15 DIAGNOSIS — E861 Hypovolemia: Secondary | ICD-10-CM | POA: Insufficient documentation

## 2018-09-15 DIAGNOSIS — Z7951 Long term (current) use of inhaled steroids: Secondary | ICD-10-CM | POA: Insufficient documentation

## 2018-09-15 DIAGNOSIS — G8929 Other chronic pain: Secondary | ICD-10-CM | POA: Insufficient documentation

## 2018-09-15 DIAGNOSIS — Z888 Allergy status to other drugs, medicaments and biological substances status: Secondary | ICD-10-CM | POA: Insufficient documentation

## 2018-09-15 DIAGNOSIS — R1110 Vomiting, unspecified: Secondary | ICD-10-CM

## 2018-09-15 DIAGNOSIS — F1910 Other psychoactive substance abuse, uncomplicated: Secondary | ICD-10-CM | POA: Insufficient documentation

## 2018-09-15 DIAGNOSIS — Z881 Allergy status to other antibiotic agents status: Secondary | ICD-10-CM | POA: Insufficient documentation

## 2018-09-15 DIAGNOSIS — K859 Acute pancreatitis without necrosis or infection, unspecified: Secondary | ICD-10-CM | POA: Diagnosis not present

## 2018-09-15 DIAGNOSIS — Z886 Allergy status to analgesic agent status: Secondary | ICD-10-CM | POA: Insufficient documentation

## 2018-09-15 DIAGNOSIS — K219 Gastro-esophageal reflux disease without esophagitis: Secondary | ICD-10-CM | POA: Insufficient documentation

## 2018-09-15 DIAGNOSIS — Z20828 Contact with and (suspected) exposure to other viral communicable diseases: Secondary | ICD-10-CM | POA: Diagnosis not present

## 2018-09-15 DIAGNOSIS — M797 Fibromyalgia: Secondary | ICD-10-CM | POA: Insufficient documentation

## 2018-09-15 LAB — CBC WITH DIFFERENTIAL/PLATELET
Abs Immature Granulocytes: 0.02 10*3/uL (ref 0.00–0.07)
Basophils Absolute: 0 10*3/uL (ref 0.0–0.1)
Basophils Relative: 0 %
Eosinophils Absolute: 0 10*3/uL (ref 0.0–0.5)
Eosinophils Relative: 0 %
HCT: 37.4 % (ref 36.0–46.0)
Hemoglobin: 12.7 g/dL (ref 12.0–15.0)
Immature Granulocytes: 0 %
Lymphocytes Relative: 11 %
Lymphs Abs: 1.1 10*3/uL (ref 0.7–4.0)
MCH: 27.9 pg (ref 26.0–34.0)
MCHC: 34 g/dL (ref 30.0–36.0)
MCV: 82.2 fL (ref 80.0–100.0)
Monocytes Absolute: 0.2 10*3/uL (ref 0.1–1.0)
Monocytes Relative: 2 %
Neutro Abs: 9.1 10*3/uL — ABNORMAL HIGH (ref 1.7–7.7)
Neutrophils Relative %: 87 %
Platelets: 345 10*3/uL (ref 150–400)
RBC: 4.55 MIL/uL (ref 3.87–5.11)
RDW: 14.8 % (ref 11.5–15.5)
WBC: 10.4 10*3/uL (ref 4.0–10.5)
nRBC: 0 % (ref 0.0–0.2)

## 2018-09-15 LAB — COMPREHENSIVE METABOLIC PANEL
ALT: 26 U/L (ref 0–44)
AST: 26 U/L (ref 15–41)
Albumin: 4.3 g/dL (ref 3.5–5.0)
Alkaline Phosphatase: 85 U/L (ref 38–126)
Anion gap: 13 (ref 5–15)
BUN: 11 mg/dL (ref 6–20)
CO2: 22 mmol/L (ref 22–32)
Calcium: 9.8 mg/dL (ref 8.9–10.3)
Chloride: 105 mmol/L (ref 98–111)
Creatinine, Ser: 1.08 mg/dL — ABNORMAL HIGH (ref 0.44–1.00)
GFR calc Af Amer: >60 mL/min (ref >60)
GFR calc non Af Amer: >60 mL/min (ref >60)
Glucose, Bld: 169 mg/dL — ABNORMAL HIGH (ref 70–99)
Potassium: 3.6 mmol/L (ref 3.5–5.1)
Sodium: 140 mmol/L (ref 135–145)
Total Bilirubin: 0.9 mg/dL (ref 0.3–1.2)
Total Protein: 8.4 g/dL — ABNORMAL HIGH (ref 6.5–8.1)

## 2018-09-15 LAB — LIPASE, BLOOD: Lipase: 41 U/L (ref 11–51)

## 2018-09-15 LAB — CK: Total CK: 251 U/L — ABNORMAL HIGH (ref 38–234)

## 2018-09-15 LAB — URINALYSIS, ROUTINE W REFLEX MICROSCOPIC
Bacteria, UA: NONE SEEN
Bilirubin Urine: NEGATIVE
Glucose, UA: NEGATIVE mg/dL
Ketones, ur: 20 mg/dL — AB
Leukocytes,Ua: NEGATIVE
Nitrite: NEGATIVE
Protein, ur: 30 mg/dL — AB
Specific Gravity, Urine: 1.028 (ref 1.005–1.030)
pH: 5 (ref 5.0–8.0)

## 2018-09-15 LAB — RAPID URINE DRUG SCREEN, HOSP PERFORMED
Amphetamines: NOT DETECTED
Barbiturates: NOT DETECTED
Benzodiazepines: NOT DETECTED
Cocaine: POSITIVE — AB
Opiates: POSITIVE — AB
Tetrahydrocannabinol: NOT DETECTED

## 2018-09-15 LAB — SARS CORONAVIRUS 2 (TAT 6-24 HRS): SARS Coronavirus 2: NEGATIVE

## 2018-09-15 LAB — TROPONIN I (HIGH SENSITIVITY)
Troponin I (High Sensitivity): 3 ng/L (ref <18)
Troponin I (High Sensitivity): 3 ng/L (ref <18)

## 2018-09-15 LAB — PREGNANCY, URINE: Preg Test, Ur: NEGATIVE

## 2018-09-15 LAB — ETHANOL: Alcohol, Ethyl (B): <10 mg/dL (ref <10)

## 2018-09-15 MED ORDER — MEGESTROL ACETATE 40 MG PO TABS
40.0000 mg | ORAL_TABLET | Freq: Two times a day (BID) | ORAL | Status: DC
  Filled 2018-09-15 (×3): qty 1

## 2018-09-15 MED ORDER — VITAMIN B-1 100 MG PO TABS: 100.0000 mg | ORAL_TABLET | Freq: Every day | ORAL | Status: DC

## 2018-09-15 MED ORDER — PANTOPRAZOLE SODIUM 40 MG PO TBEC
40.0000 mg | DELAYED_RELEASE_TABLET | Freq: Every day | ORAL | Status: DC
  Filled 2018-09-15: qty 1

## 2018-09-15 MED ORDER — ONDANSETRON HCL 4 MG/2ML IJ SOLN
4.0000 mg | Freq: Once | INTRAMUSCULAR | Status: AC
  Administered 2018-09-15: 4 mg via INTRAVENOUS
  Filled 2018-09-15: qty 2

## 2018-09-15 MED ORDER — FENTANYL CITRATE (PF) 100 MCG/2ML IJ SOLN
100.0000 ug | Freq: Once | INTRAMUSCULAR | Status: AC
  Administered 2018-09-15: 100 ug via INTRAVENOUS
  Filled 2018-09-15: qty 2

## 2018-09-15 MED ORDER — MOMETASONE FURO-FORMOTEROL FUM 100-5 MCG/ACT IN AERO
2.0000 | INHALATION_SPRAY | Freq: Two times a day (BID) | RESPIRATORY_TRACT | Status: DC
  Filled 2018-09-15: qty 8.8

## 2018-09-15 MED ORDER — LORAZEPAM 2 MG/ML IJ SOLN
1.0000 mg | Freq: Once | INTRAMUSCULAR | Status: AC
  Administered 2018-09-15: 1 mg via INTRAVENOUS
  Filled 2018-09-15: qty 1

## 2018-09-15 MED ORDER — HYDROCODONE-ACETAMINOPHEN 7.5-325 MG PO TABS
1.0000 | ORAL_TABLET | Freq: Four times a day (QID) | ORAL | Status: DC | PRN
  Administered 2018-09-15 – 2018-09-16 (×3): 1 via ORAL
  Filled 2018-09-15 (×3): qty 1

## 2018-09-15 MED ORDER — ALUM & MAG HYDROXIDE-SIMETH 200-200-20 MG/5ML PO SUSP: 30.0000 mL | Freq: Once | ORAL | Status: DC

## 2018-09-15 MED ORDER — HALOPERIDOL LACTATE 5 MG/ML IJ SOLN
5.0000 mg | Freq: Once | INTRAMUSCULAR | Status: AC
  Administered 2018-09-15: 5 mg via INTRAVENOUS
  Filled 2018-09-15: qty 1

## 2018-09-15 MED ORDER — PROCHLORPERAZINE EDISYLATE 10 MG/2ML IJ SOLN
10.0000 mg | Freq: Four times a day (QID) | INTRAMUSCULAR | Status: DC | PRN
  Administered 2018-09-15 – 2018-09-16 (×2): 10 mg via INTRAVENOUS
  Filled 2018-09-15 (×2): qty 2

## 2018-09-15 MED ORDER — MORPHINE SULFATE (PF) 4 MG/ML IV SOLN: 4.0000 mg | INTRAVENOUS | Status: DC | PRN

## 2018-09-15 MED ORDER — SODIUM CHLORIDE 0.9 % IV BOLUS
1000.0000 mL | Freq: Once | INTRAVENOUS | Status: AC
  Administered 2018-09-15: 1000 mL via INTRAVENOUS

## 2018-09-15 MED ORDER — SODIUM CHLORIDE 0.9 % IV SOLN
INTRAVENOUS | Status: DC
  Administered 2018-09-15 – 2018-09-16 (×3): via INTRAVENOUS

## 2018-09-15 MED ORDER — LIDOCAINE VISCOUS HCL 2 % MT SOLN: 15.0000 mL | Freq: Once | OROMUCOSAL | Status: DC

## 2018-09-15 MED ORDER — ONDANSETRON HCL 4 MG/2ML IJ SOLN
4.0000 mg | Freq: Four times a day (QID) | INTRAMUSCULAR | Status: DC | PRN
  Administered 2018-09-15: 4 mg via INTRAVENOUS
  Filled 2018-09-15: qty 2

## 2018-09-15 MED ORDER — THIAMINE HCL 100 MG/ML IJ SOLN
100.0000 mg | Freq: Every day | INTRAMUSCULAR | Status: DC
  Filled 2018-09-15: qty 2

## 2018-09-15 MED ORDER — ONDANSETRON HCL 4 MG PO TABS
4.0000 mg | ORAL_TABLET | Freq: Four times a day (QID) | ORAL | Status: DC | PRN
  Administered 2018-09-16: 4 mg via ORAL
  Filled 2018-09-15: qty 1

## 2018-09-15 MED ORDER — ENOXAPARIN SODIUM 40 MG/0.4ML ~~LOC~~ SOLN: 40.0000 mg | SUBCUTANEOUS | Status: DC

## 2018-09-15 MED ORDER — HYDROXYZINE HCL 25 MG PO TABS
25.0000 mg | ORAL_TABLET | Freq: Four times a day (QID) | ORAL | Status: DC | PRN
  Administered 2018-09-15: 25 mg via ORAL
  Filled 2018-09-15: qty 1

## 2018-09-15 MED ORDER — METOCLOPRAMIDE HCL 5 MG/ML IJ SOLN
10.0000 mg | Freq: Once | INTRAMUSCULAR | Status: AC
  Administered 2018-09-15: 10 mg via INTRAVENOUS
  Filled 2018-09-15: qty 2

## 2018-09-15 MED ORDER — CLONIDINE HCL 0.1 MG/24HR TD PTWK
0.1000 mg | MEDICATED_PATCH | TRANSDERMAL | Status: DC
  Administered 2018-09-15: 0.1 mg via TRANSDERMAL
  Filled 2018-09-15: qty 1

## 2018-09-15 MED ORDER — AMITRIPTYLINE HCL 25 MG PO TABS
25.0000 mg | ORAL_TABLET | Freq: Every day | ORAL | Status: DC
  Administered 2018-09-15: 25 mg via ORAL
  Filled 2018-09-15: qty 1

## 2018-09-15 MED ORDER — LURASIDONE HCL 40 MG PO TABS
80.0000 mg | ORAL_TABLET | Freq: Every day | ORAL | Status: DC
  Filled 2018-09-15 (×2): qty 2

## 2018-09-15 MED ORDER — FOLIC ACID 1 MG PO TABS
1.0000 mg | ORAL_TABLET | Freq: Every day | ORAL | Status: DC
  Filled 2018-09-15: qty 1

## 2018-09-15 MED ORDER — FAMOTIDINE IN NACL 20-0.9 MG/50ML-% IV SOLN
20.0000 mg | Freq: Once | INTRAVENOUS | Status: AC
  Administered 2018-09-15: 20 mg via INTRAVENOUS
  Filled 2018-09-15: qty 50

## 2018-09-15 MED ORDER — ADULT MULTIVITAMIN W/MINERALS CH
1.0000 | ORAL_TABLET | Freq: Every day | ORAL | Status: DC
  Filled 2018-09-15: qty 1

## 2018-09-15 NOTE — ED Notes (Signed)
Stuck ptx2 unable to get blood notify nurse
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
## 2018-09-15 NOTE — Progress Notes (Signed)
New Admission Note:   Arrival Method: ED via stretcher Mental Orientation: A&ox4 Telemetry: N/A Assessment: refer to flowsheet Skin:refer to flowsheet IV: RAC, SL Pain: 7/10 Tubes: None Safety Measures: Safety Fall Prevention Plan has been discussed  Admission: to be completed 5 [place] Orientation: Patient has been orientated to the room, unit and staff.   Family: none at bedside  Orders to be reviewed and implemented. Will continue to monitor the patient. Call light has been placed within reach and bed alarm has been activated.

## 2018-09-15 NOTE — ED Notes (Signed)
Pt vomitted approx 316ml of dark emesis. Noted to be shaking but control of voluntary movements and responded to her name, shaking stopped. Afebrile.
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
## 2018-09-15 NOTE — Plan of Care (Signed)
  Problem: Education: Goal: Knowledge of General Education information will improve Description Including pain rating scale, medication(s)/side effects and non-pharmacologic comfort measures Outcome: Progressing   

## 2018-09-15 NOTE — ED Triage Notes (Addendum)
  Patient BIB EMS for pancreatitis and abdominal pain that started earlier this evening.  Patient has hx of pancreatitis and states pain is 10/10.  Per EMS, patient was find in route and was given 4mg  zofran IV.  Patient started screaming and yelling when she came to the bridge and has a hard time sitting still.  Patient is A&O x4.  Patient has had one episode of green bilious emesis since arrival.

## 2018-09-15 NOTE — ED Notes (Signed)
Attempted to call report.  Was told bed was still dirty.

## 2018-09-15 NOTE — ED Notes (Signed)
Pt's O2 noted to be 90% on RA. 1L O2 applied via [place]. O2 sat went to 91%.  O2 increased to 2L w/ an improvement in O2 sat to 100%.  Fentanyl given.  O2 sat monitored remaining at 100% on 2L of O2.  O2 decreased to 1L O2 remained 100%.  Will continue to monitor.

## 2018-09-15 NOTE — ED Provider Notes (Signed)
Patient was signed out to me at the beginning of shift.  She has had history of chronic pancreatitis who presents with abdominal pain and associate nausea vomiting diarrhea.  Her symptom has been persistent despite receiving multiple IV hydration as well as antinausea medication.  She is tremulous, she continues to endorse nausea and vomiting and continues to be symptomatic.  Given the prolonged duration of her symptoms not adequately managed in the ED, appreciate consultation from family medicine resident who agrees to see and admit patient for further managements of her intractable vomiting.  BP (!) 137/97   Pulse 99   Temp 98 F (36.7 C) (Axillary)   Resp (!) 26   LMP 08/31/2018   SpO2 100%   Results for orders placed or performed during the hospital encounter of 09/15/18  CBC with Differential  Result Value Ref Range   WBC 10.4 4.0 - 10.5 K/uL   RBC 4.55 3.87 - 5.11 MIL/uL   Hemoglobin 12.7 12.0 - 15.0 g/dL   HCT 37.4 36.0 - 46.0 %   MCV 82.2 80.0 - 100.0 fL   MCH 27.9 26.0 - 34.0 pg   MCHC 34.0 30.0 - 36.0 g/dL   RDW 14.8 11.5 - 15.5 %   Platelets 345 150 - 400 K/uL   nRBC 0.0 0.0 - 0.2 %   Neutrophils Relative % 87 %   Neutro Abs 9.1 (H) 1.7 - 7.7 K/uL   Lymphocytes Relative 11 %   Lymphs Abs 1.1 0.7 - 4.0 K/uL   Monocytes Relative 2 %   Monocytes Absolute 0.2 0.1 - 1.0 K/uL   Eosinophils Relative 0 %   Eosinophils Absolute 0.0 0.0 - 0.5 K/uL   Basophils Relative 0 %   Basophils Absolute 0.0 0.0 - 0.1 K/uL   Immature Granulocytes 0 %   Abs Immature Granulocytes 0.02 0.00 - 0.07 K/uL  Comprehensive metabolic panel  Result Value Ref Range   Sodium 140 135 - 145 mmol/L   Potassium 3.6 3.5 - 5.1 mmol/L   Chloride 105 98 - 111 mmol/L   CO2 22 22 - 32 mmol/L   Glucose, Bld 169 (H) 70 - 99 mg/dL   BUN 11 6 - 20 mg/dL   Creatinine, Ser 1.08 (H) 0.44 - 1.00 mg/dL   Calcium 9.8 8.9 - 10.3 mg/dL   Total Protein 8.4 (H) 6.5 - 8.1 g/dL   Albumin 4.3 3.5 - 5.0 g/dL   AST 26 15  - 41 U/L   ALT 26 0 - 44 U/L   Alkaline Phosphatase 85 38 - 126 U/L   Total Bilirubin 0.9 0.3 - 1.2 mg/dL   GFR calc non Af Amer >60 >60 mL/min   GFR calc Af Amer >60 >60 mL/min   Anion gap 13 5 - 15  Lipase, blood  Result Value Ref Range   Lipase 41 11 - 51 U/L  Urinalysis, Routine w reflex microscopic  Result Value Ref Range   Color, Urine YELLOW YELLOW   APPearance CLEAR CLEAR   Specific Gravity, Urine 1.028 1.005 - 1.030   pH 5.0 5.0 - 8.0   Glucose, UA NEGATIVE NEGATIVE mg/dL   Hgb urine dipstick MODERATE (A) NEGATIVE   Bilirubin Urine NEGATIVE NEGATIVE   Ketones, ur 20 (A) NEGATIVE mg/dL   Protein, ur 30 (A) NEGATIVE mg/dL   Nitrite NEGATIVE NEGATIVE   Leukocytes,Ua NEGATIVE NEGATIVE   RBC / HPF 0-5 0 - 5 RBC/hpf   WBC, UA 0-5 0 - 5 WBC/hpf   Bacteria,  UA NONE SEEN NONE SEEN   Squamous Epithelial / LPF 0-5 0 - 5   Mucus PRESENT    Hyaline Casts, UA PRESENT   Pregnancy, urine  Result Value Ref Range   Preg Test, Ur NEGATIVE NEGATIVE  CK  Result Value Ref Range   Total CK 251 (H) 38 - 234 U/L  Troponin I (High Sensitivity)  Result Value Ref Range   Troponin I (High Sensitivity) 3 <18 ng/L  Troponin I (High Sensitivity)  Result Value Ref Range   Troponin I (High Sensitivity) 3 <18 ng/L   No results found.    Domenic Moras, PA-C 09/15/18 1158    Pattricia Boss, MD 09/17/18 [phone]

## 2018-09-15 NOTE — ED Notes (Signed)
Pt given cup of water to drink for fluid challenge, tolerated without difficulty at this time. Will continue to monitor.
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
## 2018-09-15 NOTE — ED Notes (Signed)
PT VOMITING ON FLOOR.

## 2018-09-15 NOTE — ED Notes (Signed)
O2 removed, pt currently 100% on RA, will continue to monitor.

## 2018-09-16 DIAGNOSIS — K861 Other chronic pancreatitis: Secondary | ICD-10-CM | POA: Diagnosis not present

## 2018-09-16 DIAGNOSIS — R112 Nausea with vomiting, unspecified: Secondary | ICD-10-CM | POA: Diagnosis not present

## 2018-09-16 DIAGNOSIS — K859 Acute pancreatitis without necrosis or infection, unspecified: Secondary | ICD-10-CM | POA: Diagnosis not present

## 2018-09-16 LAB — CBC
HCT: 37.8 % (ref 36.0–46.0)
Hemoglobin: 12.9 g/dL (ref 12.0–15.0)
MCH: 28 pg (ref 26.0–34.0)
MCHC: 34.1 g/dL (ref 30.0–36.0)
MCV: 82.2 fL (ref 80.0–100.0)
Platelets: 307 10*3/uL (ref 150–400)
RBC: 4.6 MIL/uL (ref 3.87–5.11)
RDW: 15 % (ref 11.5–15.5)
WBC: 9.8 10*3/uL (ref 4.0–10.5)
nRBC: 0 % (ref 0.0–0.2)

## 2018-09-16 LAB — COMPREHENSIVE METABOLIC PANEL
ALT: 20 U/L (ref 0–44)
AST: 21 U/L (ref 15–41)
Albumin: 3.6 g/dL (ref 3.5–5.0)
Alkaline Phosphatase: 70 U/L (ref 38–126)
Anion gap: 12 (ref 5–15)
BUN: 7 mg/dL (ref 6–20)
CO2: 25 mmol/L (ref 22–32)
Calcium: 8.8 mg/dL — ABNORMAL LOW (ref 8.9–10.3)
Chloride: 102 mmol/L (ref 98–111)
Creatinine, Ser: 0.84 mg/dL (ref 0.44–1.00)
GFR calc Af Amer: >60 mL/min (ref >60)
GFR calc non Af Amer: >60 mL/min (ref >60)
Glucose, Bld: 132 mg/dL — ABNORMAL HIGH (ref 70–99)
Potassium: 3.2 mmol/L — ABNORMAL LOW (ref 3.5–5.1)
Sodium: 139 mmol/L (ref 135–145)
Total Bilirubin: 0.8 mg/dL (ref 0.3–1.2)
Total Protein: 7.1 g/dL (ref 6.5–8.1)

## 2018-09-16 LAB — LIPASE, BLOOD: Lipase: 22 U/L (ref 11–51)

## 2018-09-16 NOTE — Progress Notes (Signed)
Family Medicine Teaching Service Daily Progress Note Intern Pager: [phone]  Patient name: Holly Mendez Medical record number: [id number] Date of birth: 1975-04-15 Age: 43 y.o. Gender: female  Primary Care Provider: Dickie La, MD Consultants: None Code Status: Full  Pt Overview and Major Events to Date:  9/6- admitted  Assessment and Plan: Holly Mendez is a 43 y.o. female presenting with nausea, vomiting . PMH is significant for two episodes of chronic pancreatitis, depression, bipolar disorder, schizoaffective disorder, fibromyalgia, chronic low back pain, polysubstance abuse, drug-seeking history, hypertension, and HLD.  Possible developing acute on chronic pancreatitis: Patient feels much better today, able to keep down food and drink by mouth.  Requesting to go home. -Discharged home later today - ADAT - home Norco for pain, see dosing below - zofran Q4H PRN for nausea, avoid IV phenergan -d/c IVF - hold HCTZ  Polysubstance abuse: Watch for drug-seeking behavior. Patient had been clean for 13 years, relapsed earlier this year when her brother died, her mother had a stroke 2 weeks ago, has been went blind for the last year.  She is much brighter today, wishes to return to NA. She does have Norco, Narcan prescriptions for as needed use at home for back and abdominal pain. Also takes amitripytiline QHS for back pain. PDMP was evaluated, and patient appears to be receiving Norco from [facility] and no other medical offices.  Will have home norco available while here .  -Home Norco dose as needed -Social work for [employer] - continue home amitriptyline 25 mg QHS -CIWA protocol, no PRN Ativan unless paged -UDS: Positive for cocaine and opiates  Bipolar/schizoaffective disorder:  Stable -Continue home Latuda and hydroxyzine  Hypertension:Hold HCTZ and continue clonidine. She reports intolerance to Norvasc. Treat pain given  tachycardia/elevated blood pressure - continue clonidine  Abnormal uterine bleeding: [facility] for this issue.  Takes Megace 40 mg BID.  Hemoglobin is wnl on admission. - continue home Megace  FEN/GI:  ADAT PPx: lovenox 40 mg  Disposition: home   Subjective:  Patient is feeling much better today.  Able to tolerate eating and drinking by mouth.  Looking bright and alert, excited about going home and going to NA.  She had clean time of 13 years prior to this year, when her brother died in 07-18-22, her mother had a stroke 2 weeks ago, and her husband went blind.  She does have contacts to [place] and will look into going to [employer].  Objective: Temp:  [98.5 F (36.9 C)-98.6 F (37 C)] 98.5 F (36.9 C) (09/07 0453) Pulse Rate:  [87-108] 101 (09/07 0453) Resp:  [18-48] 18 (09/07 0453) BP: (122-164)/(74-105) 148/92 (09/07 0453) SpO2:  [99 %-100 %] 100 % (09/07 0453) Physical Exam: General: Well-appearing, sitting up in bed, no acute distress Cardiovascular: Regular rate and rhythm, no murmur/rub/gallop Respiratory: Clear to auscultation bilaterally Abdomen: Soft, diffusely tender to palpation worse in epigastric region, better than yesterday, nondistended Extremities: No edema  Laboratory: Recent Labs  Lab 09/15/18 0201 09/16/18 0441  WBC 10.4 9.8  HGB 12.7 12.9  HCT 37.4 37.8  PLT 345 307   Recent Labs  Lab 09/15/18 0201 09/16/18 0441  NA 140 139  K 3.6 3.2*  CL 105 102  CO2 22 25  BUN 11 7  CREATININE 1.08* 0.84  CALCIUM 9.8 8.8*  PROT 8.4* 7.1  BILITOT 0.9 0.8  ALKPHOS 85 70  ALT 26 20  AST 26 21  GLUCOSE 169* 132*    Imaging/Diagnostic Tests: No results found.   Gladys Damme, MD 09/16/2018, 8:00 AM PGY-1, Center Intern pager: [phone], text pages welcome

## 2018-09-16 NOTE — Progress Notes (Signed)
DISCHARGE NOTE [facility] to be discharged Home per MD order. Patient verbalized understanding.  Skin clean, dry and intact without evidence of skin break down, no evidence of skin tears noted. IV catheter discontinued intact. Site without signs and symptoms of complications. Dressing and pressure applied. Pt denies pain at the site currently. No complaints noted.  Patient free of lines, drains, and wounds.   Discharge packet assembled. An After Visit Summary (AVS) was printed and given to the patient. Patient escorted via wheelchair and discharged to home via taxi cab. Babs Sciara, RN

## 2018-09-16 NOTE — Discharge Instructions (Signed)
You are treated for nausea, vomiting, and abdominal pain while in the hospital.  We gave you fluids and clear liquids only as well as some nausea medication and your regular home pain medication.  Since you are doing so well today and are ready to go home, we are discharging you.  Please stop drinking alcohol, since this is likely the cause of your abdominal pain, nausea, and vomiting.  If you are interested in weaning off of hydrocodone, we highly encourage this.  Please talk to your doctor about ways to do this.  Please also abstain from using cocaine, since this is potentially life-threatening and very hard on your body.  We hope that you continue to feel better.  Please go to your follow up appointment at the [facility] clinic on 09/18/18 at 9:45AM.

## 2018-09-16 NOTE — Discharge Summary (Signed)
[facility] Discharge Summary  Patient name: Holly Mendez Medical record number: [id number] Date of birth: 06-18-1975 Age: 43 y.o. Gender: female Date of Admission: 09/15/2018  Date of Discharge:  09/16/2018 Admitting Physician: Zenia Resides, MD  Primary Care Provider: Dickie La, MD Consultants: none  Indication for Hospitalization: unable to tolerate PO, chronic pancreatitis  Discharge Diagnoses/Problem List:  Chronic pancreatitis Depression BPD Schizoaffective disorder Fibromyalgia Chonic low back pain Polysubstance abuse Drug-seeking history HLD  Disposition: to home  Discharge Condition: improved  Discharge Exam:  General: Well-appearing, sitting up in bed, no acute distress Cardiovascular: Regular rate and rhythm, no murmur/rub/gallop Respiratory: Clear to auscultation bilaterally Abdomen: Soft, diffusely tender to palpation worse in epigastric region, better than yesterday, nondistended Extremities: No edema  Brief Hospital Course:  Holly Mendez presented to the ED after 5 days of nausea and vomiting secondary to alcohol and cocaine consumption. She had been unable to tolerate PO intake and sought medical treatment. On 8/30 she stated that she had consumed 2 cups of liquor as well as two 40 oz beers. She also had used crack cocaine, and subsequently she began having intense abdominal pain as well as nausea and vomiting. She appeared hypovolemic on admission with dry mucous membranes, UA with ketones, and non bloody, bilious vomit. Patient has a history of chronic pancreatitis secondary to ethanol use. Lipase was 41 this admission.   Patient was admitted with mIVF, a clear liquid diet, CIWA protocol, and home medications. She did not require any ativan or pain medications. The following morning after rehydration, patient felt immensely better. She was able to tolerate liquids and soft foods by mouth and wished to go home.  Her abdominal pain was much improved. She stated that her mother had a stroke a month ago, her brother died this summer, and her husband went blind this year. She had been clean for 13 years, but relapsed this summer. She stated that she intended to return to NA.  Issues for Follow Up:  1. Poly substance use. Patient desires to achieve sobriety again. She states that she has contacts in [place] that she can reach out to, help her find resources for supporting this decision. 2. Mental health: does she need further resources to support her during a difficult period? She uses latuda and hydroxyzine for management, consider SSRI/SNRI. 3. HTN: HCTZ and clonidine were held in the hospital. She does not tolerate norvasc. Check BP and restart or consider changing medication regimen.  Significant Procedures: none  Significant Labs and Imaging:  Recent Labs  Lab 09/15/18 0201 09/16/18 0441  WBC 10.4 9.8  HGB 12.7 12.9  HCT 37.4 37.8  PLT 345 307   Recent Labs  Lab 09/15/18 0201 09/16/18 0441  NA 140 139  K 3.6 3.2*  CL 105 102  CO2 22 25  GLUCOSE 169* 132*  BUN 11 7  CREATININE 1.08* 0.84  CALCIUM 9.8 8.8*  ALKPHOS 85 70  AST 26 21  ALT 26 20  ALBUMIN 4.3 3.6    Results/Tests Pending at Time of Discharge: none  Discharge Medications:  Allergies as of 09/16/2018      Reactions   Lyrica [pregabalin] Swelling   Swelling of hands and feet   Naproxen Swelling   Norvasc [amlodipine Besylate] Other (See Comments)   Mouth irritation    Zithromax [azithromycin] Other (See Comments)   Severe stomach cramping      Medication List    TAKE these medications   albuterol  108 (90 Base) MCG/ACT inhaler Commonly known as: ProAir HFA INHALE TWO PUFFS INTO THE LUNGS EVERY 6 HOURS AS NEEDED WHEEZING OR SHORTNESS OF BREATH What changed:   how much to take  how to take this  when to take this  reasons to take this  additional instructions   amitriptyline 25 MG tablet Commonly known as:  ELAVIL Take one or two tablets at bedtime as needed What changed:   how much to take  how to take this  when to take this  additional instructions   budesonide-formoterol 80-4.5 MCG/ACT inhaler Commonly known as: Symbicort INHALE TWO PUFFS INTO THE LUNGS TWICE DAILY What changed:   how much to take  how to take this  when to take this  additional instructions   Catapres-TTS-1 0.1 mg/24hr patch Generic drug: cloNIDine Place 1 patch (0.1 mg total) onto the skin once a week. What changed: See the new instructions.   cetirizine 10 MG tablet Commonly known as: ZYRTEC Take 1 tablet (10 mg total) by mouth daily.   esomeprazole 40 MG capsule Commonly known as: NEXIUM Take 1 capsule (40 mg total) by mouth daily before breakfast.   hydrochlorothiazide 25 MG tablet Commonly known as: HYDRODIURIL Take 1 tablet (25 mg total) by mouth daily.   HYDROcodone-acetaminophen 7.5-325 MG tablet Commonly known as: NORCO Take 1 tablet by mouth 4 (four) times daily.   hydrOXYzine 25 MG tablet Commonly known as: ATARAX/VISTARIL Take 25 mg by mouth as needed.   Hyoscyamine Sulfate SL 0.125 MG Subl Commonly known as: Levsin/SL Place 0.125 mg under the tongue every 6 (six) hours as needed (pain).   Latuda 80 MG Tabs tablet Generic drug: lurasidone Take 80 mg by mouth daily.   megestrol 40 MG tablet Commonly known as: MEGACE TAKE ONE TABLET BY MOUTH TWICE DAILY FOR THREE MONTHS What changed: See the new instructions.   metroNIDAZOLE 500 MG tablet Commonly known as: FLAGYL Take 1 tablet (500 mg total) by mouth 2 (two) times daily.   Narcan 4 MG/0.1ML Liqd nasal spray kit Generic drug: naloxone Place 1 spray into the nose as needed (overdose).   ondansetron 4 MG disintegrating tablet Commonly known as: ZOFRAN-ODT Take 1 tablet (4 mg total) by mouth every 8 (eight) hours as needed for nausea or vomiting. What changed: See the new instructions.   polyethylene glycol 17 g  packet Commonly known as: MiraLax Take 17 g by mouth daily. What changed:   when to take this  reasons to take this   Promethegan 25 MG suppository Generic drug: promethazine Place 1/2 suppository (12.5 mg total) rectally every 12 (twelve) hours as needed for nausea or vomiting. What changed: See the new instructions.       Discharge Instructions: Please refer to Patient Instructions section of EMR for full details.  Patient was counseled important signs and symptoms that should prompt return to medical care, changes in medications, dietary instructions, activity restrictions, and follow up appointments.   Follow-Up Appointments: Follow-up Information    Stark Klein, MD. Go on 09/18/2018.   Specialty: Family Medicine Why: Please go to your appointment at 9:45AM. Contact information: [address] [place] [address] [phone]           Gladys Damme, MD 09/16/2018, 9:01 PM PGY-1, [place]

## 2018-09-16 NOTE — Plan of Care (Signed)
  Problem: Education: Goal: Knowledge of General Education information will improve Description Including pain rating scale, medication(s)/side effects and non-pharmacologic comfort measures Outcome: Progressing   

## 2018-09-17 LAB — HIV ANTIBODY (ROUTINE TESTING W REFLEX): HIV Screen 4th Generation wRfx: NONREACTIVE

## 2018-09-18 ENCOUNTER — Inpatient Hospital Stay: Payer: Medicaid Other | Admitting: Family Medicine

## 2018-09-18 ENCOUNTER — Telehealth (INDEPENDENT_AMBULATORY_CARE_PROVIDER_SITE_OTHER): Payer: Medicaid Other | Admitting: Family Medicine

## 2018-09-18 ENCOUNTER — Other Ambulatory Visit: Payer: Self-pay

## 2018-09-18 DIAGNOSIS — E876 Hypokalemia: Secondary | ICD-10-CM

## 2018-09-18 DIAGNOSIS — A084 Viral intestinal infection, unspecified: Secondary | ICD-10-CM | POA: Insufficient documentation

## 2018-09-18 MED ORDER — LOPERAMIDE HCL 2 MG PO CAPS: ORAL_CAPSULE | ORAL | 0 refills | Status: DC

## 2018-09-18 NOTE — Assessment & Plan Note (Signed)
Pt was recently in hospital for nausea/vomiting/abdominal pain.  Since d/c her n/v has resolved but she is having significant nonbloody diarrhea with some incontinence but no saddle anesthesia.  Abdominal pain is described as 'mild cramping'.  Has not used drugs/alcohol since discharge. No symptoms of dehydration currently.  - loperamide to control diarrhea.  - advised to come to office if she is still having diarrhea in next 2-3 days and unable to eat/drink anything or if she is showing signs of dehydration, which were discussed with the patient.  - bmp within the next week given her hypokalemia of 3.2 at d/c and her diarrhea.

## 2018-09-18 NOTE — Progress Notes (Signed)
[facility] Telemedicine Visit  Patient consented to have virtual visit. Method of visit: Telephone  Encounter participants: Patient: Holly Mendez - located at home Provider: Benay Pike - located at [facility] Others (if applicable): none  Chief Complaint: diarrhea  HPI:  diarrhea: has had significant diarrhea since discharge from the hospital  Has had six episodes of diarrhea today.  She has been 'using the bathroom on herself' several times today.  .  She is not able to make it to the bathroom in time.  Twice it was brown and twice it was green. No blood in her stools  No fever.  She has some 'cramping' in the bottom of her stomach.  Not on any pain medication currently.  Hasn't taken hydrocodone in 3 weeks.  She says she 'really wasn't better' when she was discharged but wanted to go home to take care of her husband, who is blind.   No drug use or alcohol use since discharge.  She tried chicken noodle soup the other day but other than that is just drinking water. No vomiting, no nausea. She does not feel fatigued, SOB, dizzy upon standing.  No palpitations.   ROS: per HPI  Pertinent PMHx: drug/alcohol abuse.    Exam:  Respiratory: no signs of respiratory distress.    Assessment/Plan:  Viral gastroenteritis Pt was recently in hospital for nausea/vomiting/abdominal pain.  Since d/c her n/v has resolved but she is having significant nonbloody diarrhea with some incontinence but no saddle anesthesia.  Abdominal pain is described as 'mild cramping'.  Has not used drugs/alcohol since discharge. No symptoms of dehydration currently.  - loperamide to control diarrhea.  - advised to come to office if she is still having diarrhea in next 2-3 days and unable to eat/drink anything or if she is showing signs of dehydration, which were discussed with the patient.  - bmp within the next week given her hypokalemia of 3.2 at d/c and her diarrhea.      Time spent  during visit with patient: 12 minutes

## 2018-09-23 ENCOUNTER — Other Ambulatory Visit: Payer: Self-pay

## 2018-09-23 ENCOUNTER — Emergency Department (HOSPITAL_COMMUNITY)
Admission: EM | Admit: 2018-09-23 | Discharge: 2018-09-23 | Disposition: A | Discharge status: Home / Self Care | Payer: Medicaid Other | Attending: Emergency Medicine | Admitting: Emergency Medicine

## 2018-09-23 ENCOUNTER — Ambulatory Visit (INDEPENDENT_AMBULATORY_CARE_PROVIDER_SITE_OTHER): Payer: Medicaid Other | Admitting: Family Medicine

## 2018-09-23 ENCOUNTER — Encounter: Payer: Self-pay | Admitting: Family Medicine

## 2018-09-23 DIAGNOSIS — J45909 Unspecified asthma, uncomplicated: Secondary | ICD-10-CM | POA: Insufficient documentation

## 2018-09-23 DIAGNOSIS — R1084 Generalized abdominal pain: Secondary | ICD-10-CM | POA: Diagnosis not present

## 2018-09-23 DIAGNOSIS — I10 Essential (primary) hypertension: Secondary | ICD-10-CM | POA: Diagnosis not present

## 2018-09-23 DIAGNOSIS — Z79899 Other long term (current) drug therapy: Secondary | ICD-10-CM | POA: Insufficient documentation

## 2018-09-23 DIAGNOSIS — R112 Nausea with vomiting, unspecified: Secondary | ICD-10-CM | POA: Diagnosis not present

## 2018-09-23 DIAGNOSIS — Z87891 Personal history of nicotine dependence: Secondary | ICD-10-CM | POA: Insufficient documentation

## 2018-09-23 LAB — COMPREHENSIVE METABOLIC PANEL
ALT: 31 U/L (ref 0–44)
AST: 26 U/L (ref 15–41)
Albumin: 4.4 g/dL (ref 3.5–5.0)
Alkaline Phosphatase: 72 U/L (ref 38–126)
Anion gap: 16 — ABNORMAL HIGH (ref 5–15)
BUN: 7 mg/dL (ref 6–20)
CO2: 26 mmol/L (ref 22–32)
Calcium: 9.9 mg/dL (ref 8.9–10.3)
Chloride: 97 mmol/L — ABNORMAL LOW (ref 98–111)
Creatinine, Ser: 1.09 mg/dL — ABNORMAL HIGH (ref 0.44–1.00)
GFR calc Af Amer: >60 mL/min (ref >60)
GFR calc non Af Amer: >60 mL/min (ref >60)
Glucose, Bld: 116 mg/dL — ABNORMAL HIGH (ref 70–99)
Potassium: 2.9 mmol/L — ABNORMAL LOW (ref 3.5–5.1)
Sodium: 139 mmol/L (ref 135–145)
Total Bilirubin: 0.9 mg/dL (ref 0.3–1.2)
Total Protein: 8 g/dL (ref 6.5–8.1)

## 2018-09-23 LAB — URINALYSIS, ROUTINE W REFLEX MICROSCOPIC
Bilirubin Urine: NEGATIVE
Glucose, UA: NEGATIVE mg/dL
Hgb urine dipstick: NEGATIVE
Ketones, ur: 5 mg/dL — AB
Leukocytes,Ua: NEGATIVE
Nitrite: NEGATIVE
Protein, ur: NEGATIVE mg/dL
Specific Gravity, Urine: 1.016 (ref 1.005–1.030)
pH: 6 (ref 5.0–8.0)

## 2018-09-23 LAB — CBC
HCT: 39.5 % (ref 36.0–46.0)
Hemoglobin: 13.4 g/dL (ref 12.0–15.0)
MCH: 27.8 pg (ref 26.0–34.0)
MCHC: 33.9 g/dL (ref 30.0–36.0)
MCV: 82 fL (ref 80.0–100.0)
Platelets: 360 10*3/uL (ref 150–400)
RBC: 4.82 MIL/uL (ref 3.87–5.11)
RDW: 14.4 % (ref 11.5–15.5)
WBC: 8.1 10*3/uL (ref 4.0–10.5)
nRBC: 0 % (ref 0.0–0.2)

## 2018-09-23 LAB — RAPID URINE DRUG SCREEN, HOSP PERFORMED
Amphetamines: NOT DETECTED
Barbiturates: NOT DETECTED
Benzodiazepines: NOT DETECTED
Cocaine: POSITIVE — AB
Opiates: POSITIVE — AB
Tetrahydrocannabinol: NOT DETECTED

## 2018-09-23 LAB — PREGNANCY, URINE: Preg Test, Ur: NEGATIVE

## 2018-09-23 LAB — LIPASE, BLOOD: Lipase: 79 U/L — ABNORMAL HIGH (ref 11–51)

## 2018-09-23 MED ORDER — SODIUM CHLORIDE 0.9 % IV BOLUS
1000.0000 mL | Freq: Once | INTRAVENOUS | Status: AC
  Administered 2018-09-23: 1000 mL via INTRAVENOUS

## 2018-09-23 MED ORDER — ONDANSETRON HCL 4 MG/2ML IJ SOLN
4.0000 mg | Freq: Once | INTRAMUSCULAR | Status: AC
  Administered 2018-09-23: 4 mg via INTRAVENOUS
  Filled 2018-09-23: qty 2

## 2018-09-23 MED ORDER — METOCLOPRAMIDE HCL 10 MG PO TABS: 10.0000 mg | ORAL_TABLET | Freq: Three times a day (TID) | ORAL | 0 refills | Status: DC | PRN

## 2018-09-23 MED ORDER — HYDROMORPHONE HCL 1 MG/ML IJ SOLN
1.0000 mg | Freq: Once | INTRAMUSCULAR | Status: AC
  Administered 2018-09-23: 1 mg via INTRAVENOUS
  Filled 2018-09-23: qty 1

## 2018-09-23 MED ORDER — POTASSIUM CHLORIDE 10 MEQ/100ML IV SOLN
10.0000 meq | INTRAVENOUS | Status: AC
  Administered 2018-09-23 (×2): 10 meq via INTRAVENOUS
  Filled 2018-09-23 (×2): qty 100

## 2018-09-23 MED ORDER — PROMETHAZINE HCL 25 MG/ML IJ SOLN: 25.0000 mg | Freq: Once | INTRAMUSCULAR | Status: DC

## 2018-09-23 MED ORDER — POTASSIUM CHLORIDE CRYS ER 20 MEQ PO TBCR: EXTENDED_RELEASE_TABLET | ORAL | 0 refills | Status: DC

## 2018-09-23 MED ORDER — LORAZEPAM 2 MG/ML IJ SOLN
1.0000 mg | Freq: Once | INTRAMUSCULAR | Status: AC
  Administered 2018-09-23: 11:00:00 1 mg via INTRAVENOUS
  Filled 2018-09-23: qty 1

## 2018-09-23 MED ORDER — PANTOPRAZOLE SODIUM 40 MG IV SOLR
40.0000 mg | Freq: Once | INTRAVENOUS | Status: AC
  Administered 2018-09-23: 11:00:00 40 mg via INTRAVENOUS
  Filled 2018-09-23: qty 40

## 2018-09-23 MED ORDER — POTASSIUM CHLORIDE CRYS ER 20 MEQ PO TBCR
40.0000 meq | EXTENDED_RELEASE_TABLET | Freq: Once | ORAL | Status: AC
  Administered 2018-09-23: 40 meq via ORAL
  Filled 2018-09-23: qty 2

## 2018-09-23 MED ORDER — METOCLOPRAMIDE HCL 5 MG/ML IJ SOLN
10.0000 mg | Freq: Once | INTRAMUSCULAR | Status: AC
  Administered 2018-09-23: 11:00:00 10 mg via INTRAVENOUS
  Filled 2018-09-23: qty 2

## 2018-09-23 MED ORDER — ONDANSETRON HCL 4 MG/2ML IJ SOLN
4.0000 mg | Freq: Once | INTRAMUSCULAR | Status: AC
  Administered 2018-09-23: 4 mg via INTRAMUSCULAR

## 2018-09-23 MED ORDER — HYDROMORPHONE HCL 1 MG/ML IJ SOLN
0.5000 mg | Freq: Once | INTRAMUSCULAR | Status: AC
  Administered 2018-09-23: 13:00:00 0.5 mg via INTRAVENOUS
  Filled 2018-09-23: qty 1

## 2018-09-23 NOTE — ED Triage Notes (Signed)
Per [facility]. Pt here for reeval of abd pain and emesis. Pt non cooperative and noncontributory to triage or exam.
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
## 2018-09-23 NOTE — Patient Instructions (Addendum)
Thank you for choosing [facility] for your primary care. Holly Mendez was seen for persistent nausea and vomiting.   Due to the amount of pain you are in and your inability to tolerate fluids by mouth, we are recommending that you go to the emergency department for IV fluids and medication to help with your vomiting.   You were given an injection of 4mg  of Zofran to attempt to help with your nausea and vomiting in our clinic today.    You should return to our clinic within 1-2 after being discharged from the ED or the hospital for follow up of your fluid status and abdominal pain.   Best,  Dr. Rosita Fire

## 2018-09-23 NOTE — ED Notes (Addendum)
Pt tolerated fluids and crackers without nausea/vomiting

## 2018-09-23 NOTE — Discharge Instructions (Addendum)
It was our pleasure to provide your ER care today - we hope that you feel better.  Continue nexium. You may take reglan as need for nausea.  From today's labs, your potassium level is low (2.9) - take potassium supplement, eat plenty of fruits and vegetables, and follow up with primary care doctor in 1 week.   Return to ER if worse, new symptoms, fevers, persistent vomiting, worsening or severe abdominal pain, or other concern.   You were given pain medication in the ER - no driving for the next 6 hours.

## 2018-09-23 NOTE — Progress Notes (Signed)
  Patient Name: Holly Mendez Date of Birth: [birth year] Date of Visit: 09/23/18 PCP: Dickie La, MD  Chief Complaint: nausea and vomiting   Subjective: Holly Mendez is a 43 y.o. with medical history significant for HTN, HLD, GERD, depression, bipolar disorder, asthma and anxiety presenting today for continued nausea and vomiting since being discharged from the hospital on 09/16/18. Holly Mendez states that she has not been able to tolerate fluids or any solid foods since her dishcharge from the hospital. She reports vomiting multiple times per day all throughout the day.    Patient reports that she continues to urinate with no abnormal changes in color or blood present. She reports lower abdominal pain, denies fevers but does endorse chills. Her emesis has no blood per patient but consists primarily of saliva.   Patient also reports new rash on her abdomen and chest, stating "my skin is falling off of me"  I have reviewed the patient's medical, surgical, family, and social history as appropriate.  Vitals:   09/23/18 0830  BP: 140/88  Pulse: (!) 110  SpO2: 96%    Physical Exam:   General: Alert and cooperative,visibly uncomfortable, patient is shaking and moaning  HEENT: Neck non-tender without lymphadenopathy, masses or thyromegaly Cardio: patient with rigors, difficulty to assess for heart sounds  Pulm:  Normal respiratory effort, patient is not in respiratory distress  Abdomen: Bowel sounds normal throughout, diffuse tenderness, erythema on abdomen  Extremities: No peripheral edema. Warm/ well perfused.  Neuro: alert and oriented  Assessment & Plan:   Non-intractable vomiting Patient has been experiencing nasuea and emesis since 09/16/18. Patient reports that she has only been able to keep down a small amount of fluids and vomits "all day long". Patient reports being prescribed suppositories and Zofran with no relief.  -patient given 4mg  Zofran IM as  clinic does not have phenergan at this time  -patient transported to ED for IV fluids and further evalution via [facility]   Patient is being transported to the ED for IV fluids and further evaluation.   Stark Klein, MD  Family Medicine  PGY1

## 2018-09-23 NOTE — Assessment & Plan Note (Addendum)
Patient has been experiencing nasuea and emesis since 09/16/18. Patient reports that she has only been able to keep down a small amount of fluids and vomits "all day long". Patient reports being prescribed suppositories and Zofran with no relief.  -patient given 4mg  Zofran IM as clinic does not have phenergan at this time  -patient transported to ED for IV fluids and further evalution via [facility]

## 2018-09-23 NOTE — ED Notes (Signed)
Patient Alert and oriented to baseline. Stable and ambulatory to baseline. Patient verbalized understanding of the discharge instructions.  Patient belongings were taken by the patient. Pt called friend for ride from ED.

## 2018-09-26 ENCOUNTER — Ambulatory Visit: Payer: Medicaid Other

## 2018-09-29 ENCOUNTER — Emergency Department (HOSPITAL_COMMUNITY)
Admission: EM | Admit: 2018-09-29 | Discharge: 2018-09-29 | Disposition: A | Discharge status: Home / Self Care | Payer: Medicaid Other | Attending: Emergency Medicine | Admitting: Emergency Medicine

## 2018-09-29 ENCOUNTER — Other Ambulatory Visit: Payer: Self-pay

## 2018-09-29 ENCOUNTER — Emergency Department (HOSPITAL_COMMUNITY): Payer: Medicaid Other

## 2018-09-29 DIAGNOSIS — Z87891 Personal history of nicotine dependence: Secondary | ICD-10-CM | POA: Insufficient documentation

## 2018-09-29 DIAGNOSIS — R109 Unspecified abdominal pain: Secondary | ICD-10-CM | POA: Diagnosis present

## 2018-09-29 DIAGNOSIS — I10 Essential (primary) hypertension: Secondary | ICD-10-CM | POA: Insufficient documentation

## 2018-09-29 DIAGNOSIS — Z79899 Other long term (current) drug therapy: Secondary | ICD-10-CM | POA: Insufficient documentation

## 2018-09-29 DIAGNOSIS — R112 Nausea with vomiting, unspecified: Secondary | ICD-10-CM

## 2018-09-29 DIAGNOSIS — R1084 Generalized abdominal pain: Secondary | ICD-10-CM | POA: Insufficient documentation

## 2018-09-29 DIAGNOSIS — J45909 Unspecified asthma, uncomplicated: Secondary | ICD-10-CM | POA: Diagnosis not present

## 2018-09-29 LAB — CBC
HCT: 39.9 % (ref 36.0–46.0)
HCT: 38.7 % (ref 36.0–46.0)
Hemoglobin: 13.1 g/dL (ref 12.0–15.0)
Hemoglobin: 13.4 g/dL (ref 12.0–15.0)
MCH: 27.5 pg (ref 26.0–34.0)
MCH: 28.6 pg (ref 26.0–34.0)
MCHC: 32.8 g/dL (ref 30.0–36.0)
MCHC: 34.6 g/dL (ref 30.0–36.0)
MCV: 83.6 fL (ref 80.0–100.0)
MCV: 82.7 fL (ref 80.0–100.0)
Platelets: 324 10*3/uL (ref 150–400)
Platelets: 332 10*3/uL (ref 150–400)
RBC: 4.77 MIL/uL (ref 3.87–5.11)
RBC: 4.68 MIL/uL (ref 3.87–5.11)
RDW: 14.9 % (ref 11.5–15.5)
RDW: 14.9 % (ref 11.5–15.5)
WBC: 7.8 10*3/uL (ref 4.0–10.5)
WBC: 8.9 10*3/uL (ref 4.0–10.5)
nRBC: 0 % (ref 0.0–0.2)
nRBC: 0 % (ref 0.0–0.2)

## 2018-09-29 LAB — COMPREHENSIVE METABOLIC PANEL
ALT: 21 U/L (ref 0–44)
AST: 22 U/L (ref 15–41)
Albumin: 3.9 g/dL (ref 3.5–5.0)
Alkaline Phosphatase: 73 U/L (ref 38–126)
Anion gap: 11 (ref 5–15)
BUN: 5 mg/dL — ABNORMAL LOW (ref 6–20)
CO2: 25 mmol/L (ref 22–32)
Calcium: 9.1 mg/dL (ref 8.9–10.3)
Chloride: 103 mmol/L (ref 98–111)
Creatinine, Ser: 1.09 mg/dL — ABNORMAL HIGH (ref 0.44–1.00)
GFR calc Af Amer: >60 mL/min (ref >60)
GFR calc non Af Amer: >60 mL/min (ref >60)
Glucose, Bld: 171 mg/dL — ABNORMAL HIGH (ref 70–99)
Potassium: 3.4 mmol/L — ABNORMAL LOW (ref 3.5–5.1)
Sodium: 139 mmol/L (ref 135–145)
Total Bilirubin: 0.8 mg/dL (ref 0.3–1.2)
Total Protein: 7.3 g/dL (ref 6.5–8.1)

## 2018-09-29 LAB — DIFFERENTIAL
Abs Immature Granulocytes: 0.03 10*3/uL (ref 0.00–0.07)
Basophils Absolute: 0 10*3/uL (ref 0.0–0.1)
Basophils Relative: 0 %
Eosinophils Absolute: 0 10*3/uL (ref 0.0–0.5)
Eosinophils Relative: 0 %
Immature Granulocytes: 0 %
Lymphocytes Relative: 10 %
Lymphs Abs: 0.9 10*3/uL (ref 0.7–4.0)
Monocytes Absolute: 0.3 10*3/uL (ref 0.1–1.0)
Monocytes Relative: 3 %
Neutro Abs: 7.7 10*3/uL (ref 1.7–7.7)
Neutrophils Relative %: 87 %

## 2018-09-29 LAB — I-STAT BETA HCG BLOOD, ED (MC, WL, AP ONLY): I-stat hCG, quantitative: <5 m[IU]/mL (ref <5)

## 2018-09-29 LAB — LIPASE, BLOOD: Lipase: 74 U/L — ABNORMAL HIGH (ref 11–51)

## 2018-09-29 MED ORDER — LORAZEPAM 2 MG/ML IJ SOLN
0.0000 mg | Freq: Four times a day (QID) | INTRAMUSCULAR | Status: DC
  Administered 2018-09-29: 1 mg via INTRAVENOUS
  Filled 2018-09-29: qty 1

## 2018-09-29 MED ORDER — LORAZEPAM 2 MG/ML IJ SOLN: 0.0000 mg | Freq: Two times a day (BID) | INTRAMUSCULAR | Status: DC

## 2018-09-29 MED ORDER — VITAMIN B-1 100 MG PO TABS: 100.0000 mg | ORAL_TABLET | Freq: Every day | ORAL | Status: DC

## 2018-09-29 MED ORDER — LORAZEPAM 1 MG PO TABS: 0.0000 mg | ORAL_TABLET | Freq: Four times a day (QID) | ORAL | Status: DC

## 2018-09-29 MED ORDER — HALOPERIDOL LACTATE 5 MG/ML IJ SOLN
2.0000 mg | Freq: Once | INTRAMUSCULAR | Status: AC
  Administered 2018-09-29: 20:00:00 2 mg via INTRAVENOUS
  Filled 2018-09-29: qty 1

## 2018-09-29 MED ORDER — LORAZEPAM 1 MG PO TABS: 0.0000 mg | ORAL_TABLET | Freq: Two times a day (BID) | ORAL | Status: DC

## 2018-09-29 MED ORDER — SODIUM CHLORIDE 0.9 % IV BOLUS
1000.0000 mL | Freq: Once | INTRAVENOUS | Status: AC
  Administered 2018-09-29: 16:00:00 1000 mL via INTRAVENOUS

## 2018-09-29 MED ORDER — METOCLOPRAMIDE HCL 5 MG/ML IJ SOLN
10.0000 mg | Freq: Once | INTRAMUSCULAR | Status: AC
  Administered 2018-09-29: 16:00:00 10 mg via INTRAVENOUS
  Filled 2018-09-29: qty 2

## 2018-09-29 MED ORDER — THIAMINE HCL 100 MG/ML IJ SOLN
100.0000 mg | Freq: Every day | INTRAMUSCULAR | Status: DC
  Administered 2018-09-29: 100 mg via INTRAVENOUS
  Filled 2018-09-29: qty 2

## 2018-09-29 NOTE — ED Notes (Signed)
Patient transported to X-ray 

## 2018-09-29 NOTE — ED Notes (Signed)
Water given to pt for fluid challenge 

## 2018-09-29 NOTE — ED Notes (Signed)
Pt was able to keep fluids down.  

## 2018-09-29 NOTE — ED Notes (Signed)
Pt discharged, however when obtaining discharge vitals, the pulse rate was elevated (115). When I asked the pt to sit tight and let me talk to the PA real fast she said "my ride is here I need to go." she was told to wait for just a minute. When I talked to the provider about the situation we went back to the pt's room but the pt had already left and did not take her discharge paperwork with her. Charge notified about the situation. We attempted to look for the pt in the waiting room but the pt had already left.

## 2018-09-29 NOTE — Discharge Instructions (Signed)
Please take the medications you have at home for your nausea Follow up with your PCP tomorrow morning regarding your ED visits

## 2018-09-29 NOTE — ED Triage Notes (Signed)
Pt here from home via EMS for generalized abdominal pain and n/v x 2 days. Hx pancreatitis, but denies triggers. 100 mcg Fentanyl, 4 mg zofran, and 400 cc NS given PTA.

## 2018-09-29 NOTE — ED Notes (Signed)
Patient continues to c/o pain and nausea, however she hasn't had any emesis or dry heaving since given Ativan 1 hour ago. EDP made aware. No new orders at this time.
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
## 2018-10-01 ENCOUNTER — Telehealth: Payer: Self-pay | Admitting: Family Medicine

## 2018-10-01 NOTE — Telephone Encounter (Signed)
Called patient to ask her the [place] questions. She was very rude, and loud. She stated she was not coming in the office unless they were going to do surgery so this better be a telephone visit. She would not let me speak at firs. When I was able to speak with her, I informed her I would make this a telephone visit. She stated she was in too much pain to get out her bed, and wasn't going to make an office visit.

## 2018-10-02 ENCOUNTER — Other Ambulatory Visit: Payer: Self-pay

## 2018-10-02 ENCOUNTER — Ambulatory Visit: Payer: Medicaid Other | Admitting: Family Medicine

## 2018-10-02 DIAGNOSIS — Z5329 Procedure and treatment not carried out because of patient's decision for other reasons: Secondary | ICD-10-CM

## 2018-10-02 DIAGNOSIS — Z91199 Patient's noncompliance with other medical treatment and regimen due to unspecified reason: Secondary | ICD-10-CM

## 2018-10-02 NOTE — Progress Notes (Signed)
@  415pm no answer LVM for appointment will call back at 430pm @430pm  no answer LVM with Instructions to call back for appointment

## 2018-10-02 NOTE — Progress Notes (Signed)
Patient did not keep appointment today. She may call to reschedule.  

## 2018-10-07 IMAGING — CT CT ABD-PELV W/ CM
2 of 5 series · 16 of 46 positions shown, 18 images · IV contrast (ISOVUE)
Comparison: Limited abdomen ultrasound dated 11/17/2015 and abdomen
and pelvis CT dated 11/16/2015.

CLINICAL DATA: Diffuse abdominal pain since yesterday. One episode
of diarrhea last night. Vomited with bright red blood in the vomit.

EXAM:
CT ABDOMEN AND PELVIS WITH CONTRAST
TECHNIQUE: Multidetector CT imaging of the abdomen and pelvis was performed
using the standard protocol following bolus administration of
intravenous contrast.
CONTRAST:  100mL [id number]-Q11 IOPAMIDOL ([id number]-Q11) INJECTION 61%

[Series 2: abd/pel with · axial · 0.73mm/px · z∈[-676,-316]mm · 13 of 84 slices shown, 15 images]
[im 6/84  soft-tissue]
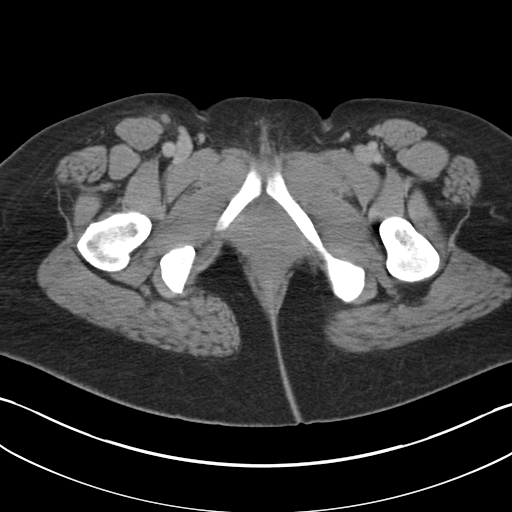
[im 6/84  bone]
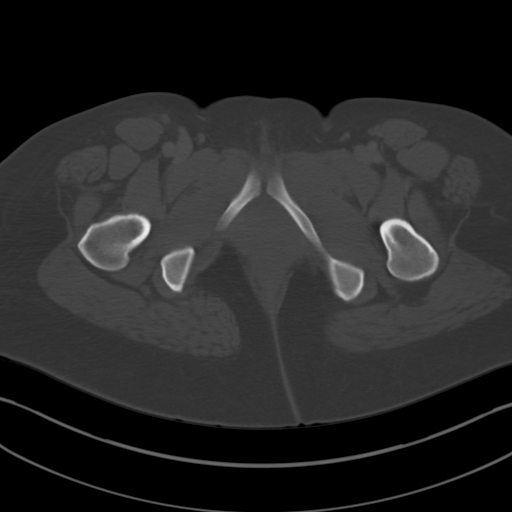
[im 11/84  soft-tissue]
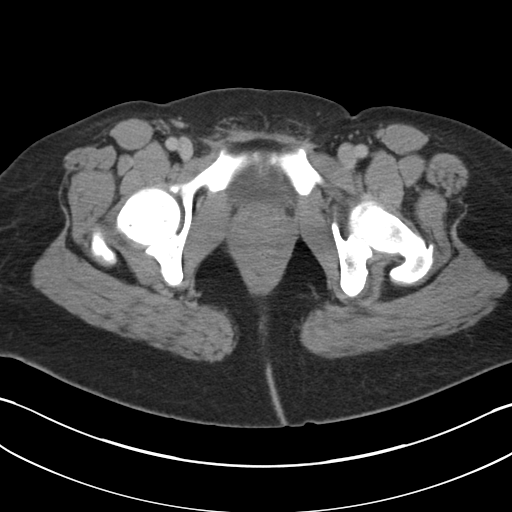
[im 16/84  soft-tissue]
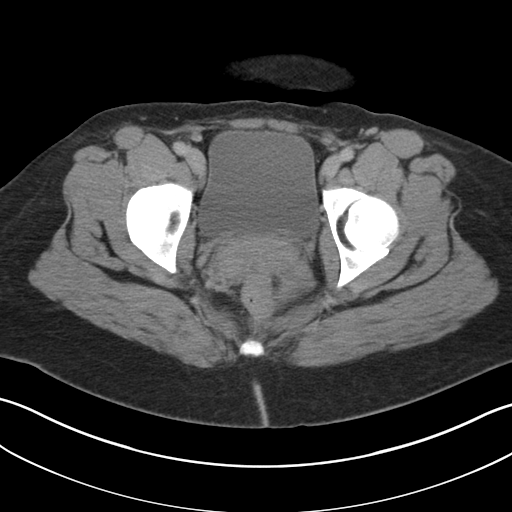
[im 26/84  soft-tissue]
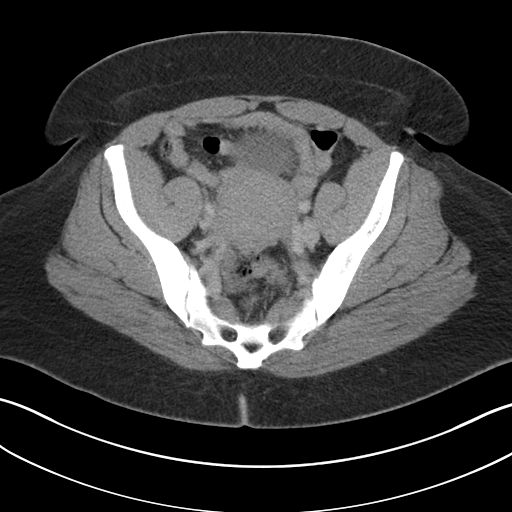
[im 32/84  soft-tissue]
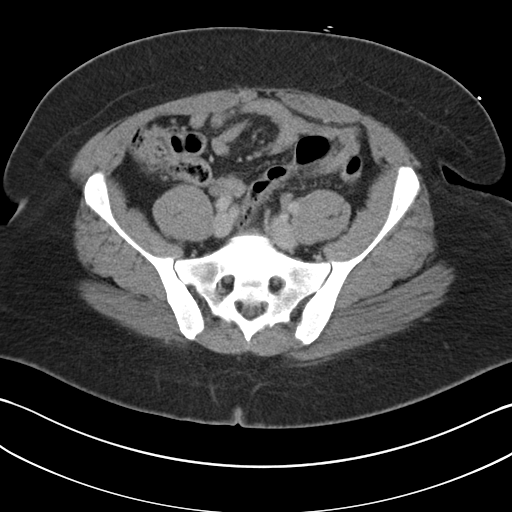
[im 37/84  soft-tissue]
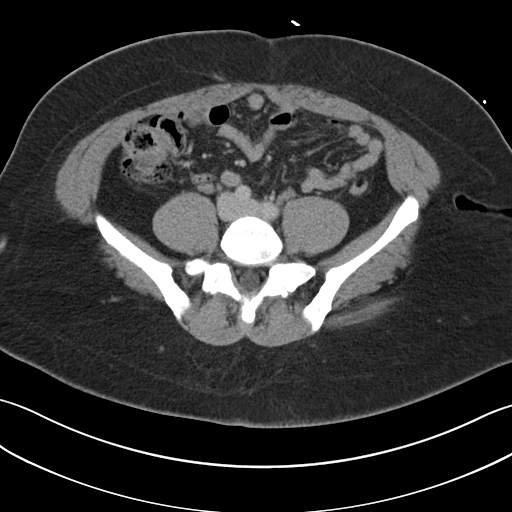
[im 42/84  soft-tissue]
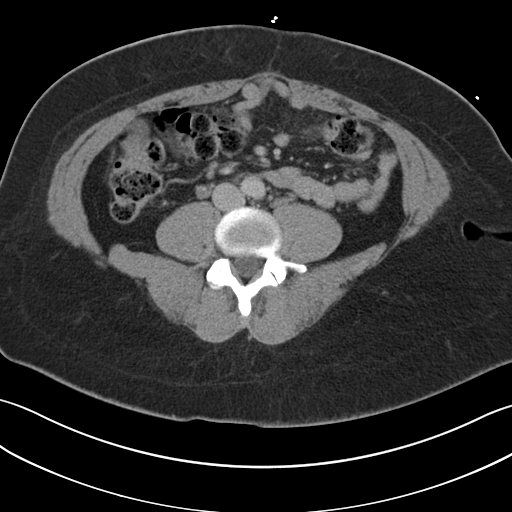
[im 47/84  soft-tissue]
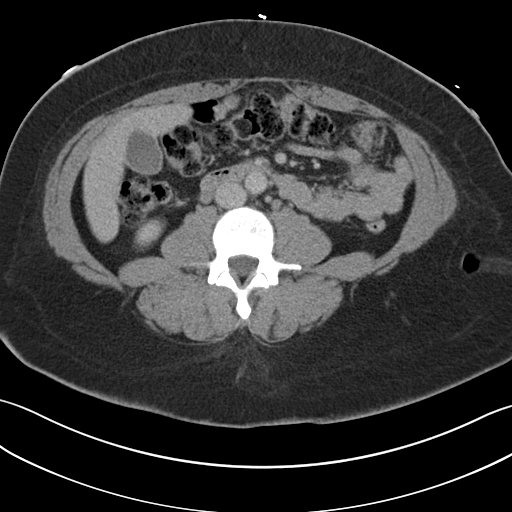
[im 52/84  soft-tissue]
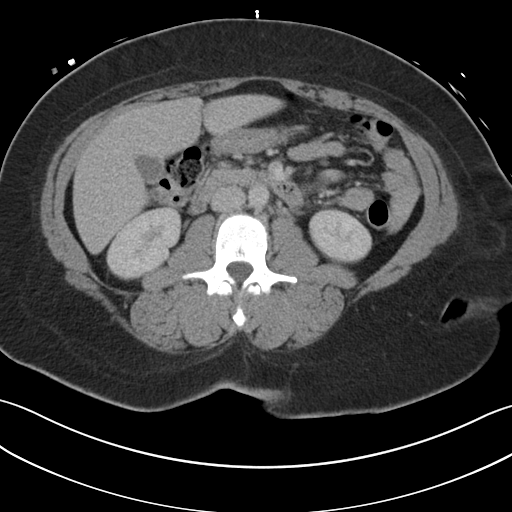
[im 52/84  bone]
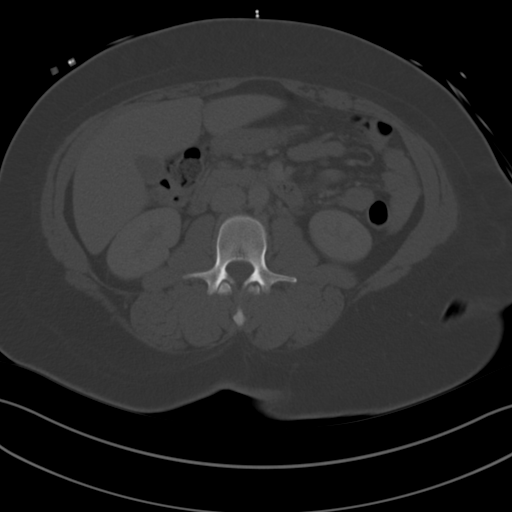
[im 58/84  soft-tissue]
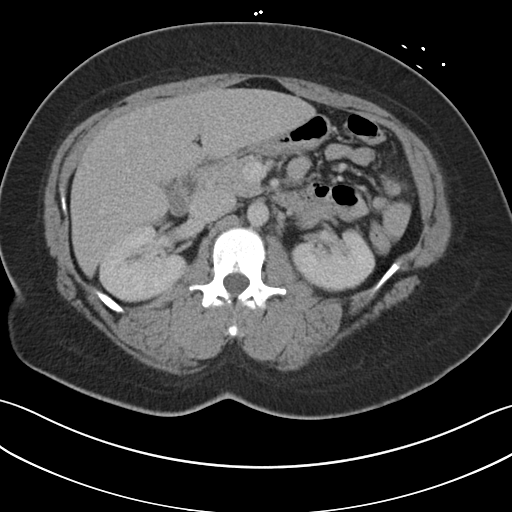
[im 68/84  soft-tissue]
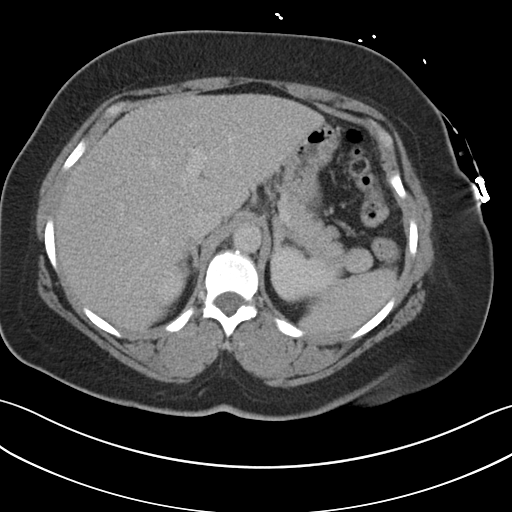
[im 73/84  soft-tissue]
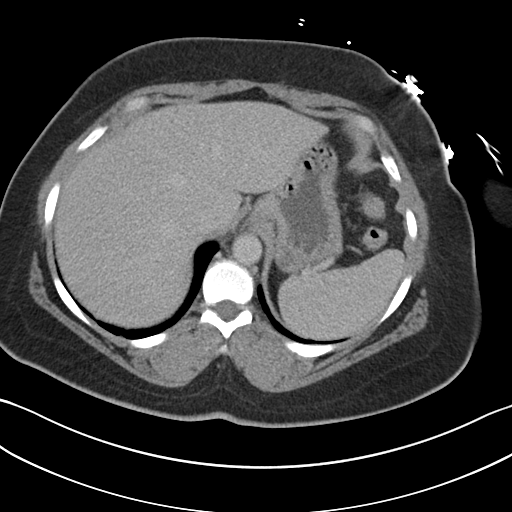
[im 78/84  soft-tissue]
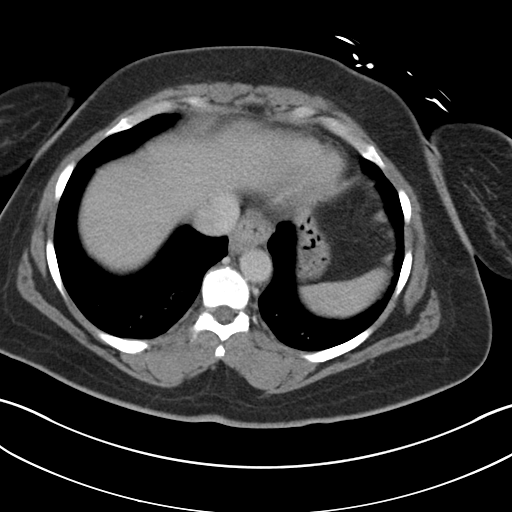

[Series 3: coronal a/|p · coronal · 0.84mm/px · 3 of 145 slices shown]
[im 49/145  soft-tissue]
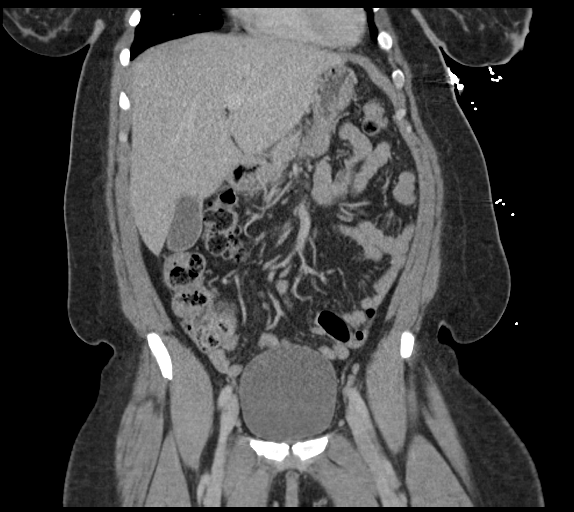
[im 65/145  soft-tissue]
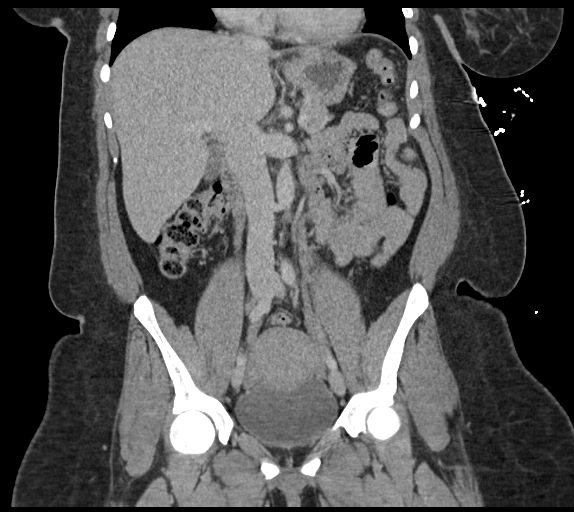
[im 81/145  soft-tissue]
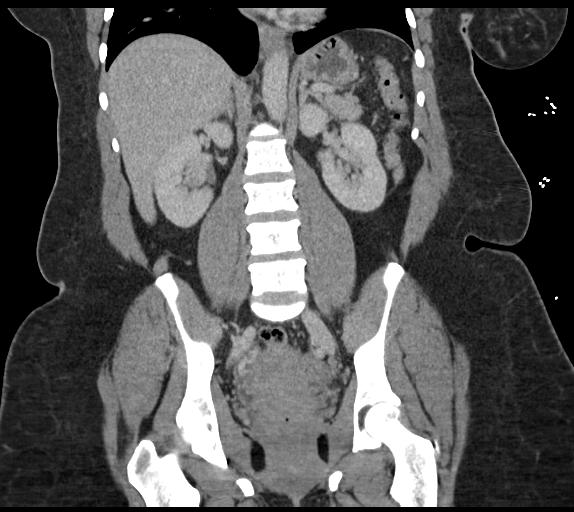

[16 of 46 positions shown; findings below may reference images not displayed]

FINDINGS: Lower chest: Minimal right basilar atelectasis or scarring.

Hepatobiliary: No focal liver abnormality is seen. No gallstones,
gallbladder wall thickening, or biliary dilatation.

Pancreas: Unremarkable. No pancreatic ductal dilatation or
surrounding inflammatory changes.

Spleen: Normal in size without focal abnormality.

Adrenals/Urinary Tract: Adrenal glands are unremarkable. Kidneys are
normal, without renal calculi, focal lesion, or hydronephrosis.
Bladder is unremarkable.

Stomach/Bowel: Small hiatal hernia. Unremarkable small bowel and
colon. No evidence of appendicitis.

Vascular/Lymphatic: No significant vascular findings are present. No
enlarged abdominal or pelvic lymph nodes.

Reproductive: Uterus and bilateral adnexa are unremarkable.

Other: Very small umbilical hernia containing the anterior portion
of a small bowel loop without bowel dilatation or wall thickening.

Musculoskeletal: Minimal bowel lateral hip degenerative changes.
Small T12 bone island. Small L1 ribs.
IMPRESSION: 1. No acute abnormality.
2. Very small umbilical hernia containing the anterior wall of a
small bowel loop without obstruction.

## 2018-10-19 ENCOUNTER — Other Ambulatory Visit: Payer: Self-pay | Admitting: Family Medicine

## 2018-10-20 ENCOUNTER — Encounter (HOSPITAL_COMMUNITY): Payer: Self-pay

## 2018-10-20 ENCOUNTER — Emergency Department (HOSPITAL_COMMUNITY)
Admission: EM | Admit: 2018-10-20 | Discharge: 2018-10-20 | Disposition: A | Discharge status: Home / Self Care | Payer: Medicaid Other | Attending: Emergency Medicine | Admitting: Emergency Medicine

## 2018-10-20 ENCOUNTER — Other Ambulatory Visit: Payer: Self-pay

## 2018-10-20 DIAGNOSIS — I10 Essential (primary) hypertension: Secondary | ICD-10-CM | POA: Diagnosis not present

## 2018-10-20 DIAGNOSIS — Z79899 Other long term (current) drug therapy: Secondary | ICD-10-CM | POA: Diagnosis not present

## 2018-10-20 DIAGNOSIS — R1013 Epigastric pain: Secondary | ICD-10-CM | POA: Diagnosis not present

## 2018-10-20 DIAGNOSIS — R112 Nausea with vomiting, unspecified: Secondary | ICD-10-CM | POA: Diagnosis not present

## 2018-10-20 DIAGNOSIS — J45909 Unspecified asthma, uncomplicated: Secondary | ICD-10-CM | POA: Diagnosis not present

## 2018-10-20 DIAGNOSIS — Z87891 Personal history of nicotine dependence: Secondary | ICD-10-CM | POA: Insufficient documentation

## 2018-10-20 LAB — CBC
HCT: 39.4 % (ref 36.0–46.0)
Hemoglobin: 13.1 g/dL (ref 12.0–15.0)
MCH: 27.7 pg (ref 26.0–34.0)
MCHC: 33.2 g/dL (ref 30.0–36.0)
MCV: 83.3 fL (ref 80.0–100.0)
Platelets: 364 10*3/uL (ref 150–400)
RBC: 4.73 MIL/uL (ref 3.87–5.11)
RDW: 14.6 % (ref 11.5–15.5)
WBC: 9.8 10*3/uL (ref 4.0–10.5)
nRBC: 0 % (ref 0.0–0.2)

## 2018-10-20 LAB — I-STAT BETA HCG BLOOD, ED (MC, WL, AP ONLY): I-stat hCG, quantitative: <5 m[IU]/mL (ref <5)

## 2018-10-20 LAB — COMPREHENSIVE METABOLIC PANEL
ALT: 39 U/L (ref 0–44)
AST: 34 U/L (ref 15–41)
Albumin: 4.7 g/dL (ref 3.5–5.0)
Alkaline Phosphatase: 91 U/L (ref 38–126)
Anion gap: 14 (ref 5–15)
BUN: 12 mg/dL (ref 6–20)
CO2: 22 mmol/L (ref 22–32)
Calcium: 9.7 mg/dL (ref 8.9–10.3)
Chloride: 102 mmol/L (ref 98–111)
Creatinine, Ser: 1.14 mg/dL — ABNORMAL HIGH (ref 0.44–1.00)
GFR calc Af Amer: >60 mL/min (ref >60)
GFR calc non Af Amer: 58 mL/min — ABNORMAL LOW (ref >60)
Glucose, Bld: 105 mg/dL — ABNORMAL HIGH (ref 70–99)
Potassium: 3.5 mmol/L (ref 3.5–5.1)
Sodium: 138 mmol/L (ref 135–145)
Total Bilirubin: 1.4 mg/dL — ABNORMAL HIGH (ref 0.3–1.2)
Total Protein: 8.5 g/dL — ABNORMAL HIGH (ref 6.5–8.1)

## 2018-10-20 LAB — URINALYSIS, ROUTINE W REFLEX MICROSCOPIC
Bilirubin Urine: NEGATIVE
Glucose, UA: NEGATIVE mg/dL
Ketones, ur: 20 mg/dL — AB
Leukocytes,Ua: NEGATIVE
Nitrite: NEGATIVE
Protein, ur: 100 mg/dL — AB
Specific Gravity, Urine: 1.025 (ref 1.005–1.030)
pH: 5 (ref 5.0–8.0)

## 2018-10-20 LAB — LIPASE, BLOOD: Lipase: 26 U/L (ref 11–51)

## 2018-10-20 MED ORDER — SODIUM CHLORIDE 0.9 % IV SOLN
INTRAVENOUS | Status: DC
  Administered 2018-10-20: 16:00:00 via INTRAVENOUS

## 2018-10-20 MED ORDER — ONDANSETRON HCL 4 MG/2ML IJ SOLN
4.0000 mg | Freq: Once | INTRAMUSCULAR | Status: AC | PRN
  Administered 2018-10-20: 4 mg via INTRAVENOUS
  Filled 2018-10-20: qty 2

## 2018-10-20 MED ORDER — SODIUM CHLORIDE 0.9 % IV BOLUS
1000.0000 mL | Freq: Once | INTRAVENOUS | Status: AC
  Administered 2018-10-20: 1000 mL via INTRAVENOUS

## 2018-10-20 MED ORDER — SODIUM CHLORIDE 0.9% FLUSH
3.0000 mL | Freq: Once | INTRAVENOUS | Status: AC
  Administered 2018-10-20: 3 mL via INTRAVENOUS

## 2018-10-20 MED ORDER — HALOPERIDOL LACTATE 5 MG/ML IJ SOLN
2.0000 mg | Freq: Once | INTRAMUSCULAR | Status: AC
  Administered 2018-10-20: 2 mg via INTRAVENOUS
  Filled 2018-10-20: qty 1

## 2018-10-20 MED ORDER — METOCLOPRAMIDE HCL 10 MG PO TABS: 10.0000 mg | ORAL_TABLET | Freq: Four times a day (QID) | ORAL | 0 refills | Status: DC

## 2018-10-20 MED ORDER — HYDROMORPHONE HCL 1 MG/ML IJ SOLN
1.0000 mg | Freq: Once | INTRAMUSCULAR | Status: AC
  Administered 2018-10-20: 1 mg via INTRAVENOUS
  Filled 2018-10-20: qty 1
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
## 2018-10-20 NOTE — ED Triage Notes (Signed)
Pt arrives [facility] from home for evaluation of abdominal pain/vomiting. Pt has a hx of chronic pancreatitis, reports that she drank alcohol a few days ago.

## 2018-10-28 ENCOUNTER — Other Ambulatory Visit: Payer: Self-pay | Admitting: Family Medicine

## 2018-10-28 ENCOUNTER — Emergency Department (HOSPITAL_COMMUNITY): Payer: Medicaid Other

## 2018-10-28 ENCOUNTER — Emergency Department (HOSPITAL_COMMUNITY)
Admission: EM | Admit: 2018-10-28 | Discharge: 2018-10-28 | Disposition: A | Discharge status: Home / Self Care | Payer: Medicaid Other | Attending: Emergency Medicine | Admitting: Emergency Medicine

## 2018-10-28 ENCOUNTER — Encounter (HOSPITAL_COMMUNITY): Payer: Self-pay | Admitting: Emergency Medicine

## 2018-10-28 DIAGNOSIS — R1084 Generalized abdominal pain: Secondary | ICD-10-CM | POA: Diagnosis present

## 2018-10-28 DIAGNOSIS — Z79899 Other long term (current) drug therapy: Secondary | ICD-10-CM | POA: Diagnosis not present

## 2018-10-28 DIAGNOSIS — J45909 Unspecified asthma, uncomplicated: Secondary | ICD-10-CM | POA: Diagnosis not present

## 2018-10-28 DIAGNOSIS — Z87891 Personal history of nicotine dependence: Secondary | ICD-10-CM | POA: Insufficient documentation

## 2018-10-28 DIAGNOSIS — I10 Essential (primary) hypertension: Secondary | ICD-10-CM | POA: Insufficient documentation

## 2018-10-28 LAB — COMPREHENSIVE METABOLIC PANEL
ALT: 24 U/L (ref 0–44)
AST: 32 U/L (ref 15–41)
Albumin: 4.2 g/dL (ref 3.5–5.0)
Alkaline Phosphatase: 73 U/L (ref 38–126)
Anion gap: 17 — ABNORMAL HIGH (ref 5–15)
BUN: 5 mg/dL — ABNORMAL LOW (ref 6–20)
CO2: 23 mmol/L (ref 22–32)
Calcium: 10.1 mg/dL (ref 8.9–10.3)
Chloride: 97 mmol/L — ABNORMAL LOW (ref 98–111)
Creatinine, Ser: 1.09 mg/dL — ABNORMAL HIGH (ref 0.44–1.00)
GFR calc Af Amer: >60 mL/min (ref >60)
GFR calc non Af Amer: >60 mL/min (ref >60)
Glucose, Bld: 114 mg/dL — ABNORMAL HIGH (ref 70–99)
Potassium: 3 mmol/L — ABNORMAL LOW (ref 3.5–5.1)
Sodium: 137 mmol/L (ref 135–145)
Total Bilirubin: 0.4 mg/dL (ref 0.3–1.2)
Total Protein: 7.4 g/dL (ref 6.5–8.1)

## 2018-10-28 LAB — CBC
HCT: 38 % (ref 36.0–46.0)
Hemoglobin: 12.5 g/dL (ref 12.0–15.0)
MCH: 27.7 pg (ref 26.0–34.0)
MCHC: 32.9 g/dL (ref 30.0–36.0)
MCV: 84.3 fL (ref 80.0–100.0)
Platelets: 384 10*3/uL (ref 150–400)
RBC: 4.51 MIL/uL (ref 3.87–5.11)
RDW: 14.5 % (ref 11.5–15.5)
WBC: 9.4 10*3/uL (ref 4.0–10.5)
nRBC: 0 % (ref 0.0–0.2)

## 2018-10-28 LAB — I-STAT BETA HCG BLOOD, ED (MC, WL, AP ONLY): I-stat hCG, quantitative: <5 m[IU]/mL (ref <5)

## 2018-10-28 LAB — LIPASE, BLOOD: Lipase: 35 U/L (ref 11–51)

## 2018-10-28 LAB — CBG MONITORING, ED: Glucose-Capillary: 107 mg/dL — ABNORMAL HIGH (ref 70–99)

## 2018-10-28 MED ORDER — SODIUM CHLORIDE 0.9 % IV BOLUS
1000.0000 mL | Freq: Once | INTRAVENOUS | Status: AC
  Administered 2018-10-28: 17:00:00 1000 mL via INTRAVENOUS

## 2018-10-28 MED ORDER — IOHEXOL 300 MG/ML  SOLN
100.0000 mL | Freq: Once | INTRAMUSCULAR | Status: AC | PRN
  Administered 2018-10-28: 21:00:00 100 mL via INTRAVENOUS

## 2018-10-28 MED ORDER — HALOPERIDOL LACTATE 5 MG/ML IJ SOLN
5.0000 mg | Freq: Once | INTRAMUSCULAR | Status: AC
  Administered 2018-10-28: 17:00:00 5 mg via INTRAVENOUS
  Filled 2018-10-28: qty 1

## 2018-10-28 MED ORDER — SODIUM CHLORIDE 0.9 % IV BOLUS
1000.0000 mL | Freq: Once | INTRAVENOUS | Status: AC
  Administered 2018-10-28: 1000 mL via INTRAVENOUS

## 2018-10-28 MED ORDER — LORAZEPAM 2 MG/ML IJ SOLN
1.0000 mg | Freq: Once | INTRAMUSCULAR | Status: AC
  Administered 2018-10-28: 17:00:00 1 mg via INTRAVENOUS
  Filled 2018-10-28: qty 1

## 2018-10-28 MED ORDER — PROMETHAZINE HCL 12.5 MG PO TABS: 12.5000 mg | ORAL_TABLET | Freq: Four times a day (QID) | ORAL | 0 refills | Status: DC | PRN

## 2018-10-28 NOTE — ED Triage Notes (Signed)
Per [facility] pt coming from home c/o RUQ abdominal pain. Patient crying and shaking uncontrollably during triage. Informed EMS that she has not eaten in 12 days or had any water in 2 days. Given 174mcg fentanyl and 4mg  zofran en route.
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
## 2018-11-02 ENCOUNTER — Encounter (HOSPITAL_COMMUNITY): Payer: Self-pay | Admitting: Emergency Medicine

## 2018-11-02 ENCOUNTER — Other Ambulatory Visit: Payer: Self-pay

## 2018-11-02 ENCOUNTER — Emergency Department (HOSPITAL_COMMUNITY)
Admission: EM | Admit: 2018-11-02 | Discharge: 2018-11-02 | Disposition: A | Discharge status: Home / Self Care | Payer: Medicaid Other | Attending: Emergency Medicine | Admitting: Emergency Medicine

## 2018-11-02 DIAGNOSIS — I10 Essential (primary) hypertension: Secondary | ICD-10-CM | POA: Insufficient documentation

## 2018-11-02 DIAGNOSIS — R1084 Generalized abdominal pain: Secondary | ICD-10-CM

## 2018-11-02 DIAGNOSIS — R112 Nausea with vomiting, unspecified: Secondary | ICD-10-CM | POA: Insufficient documentation

## 2018-11-02 DIAGNOSIS — R748 Abnormal levels of other serum enzymes: Secondary | ICD-10-CM | POA: Insufficient documentation

## 2018-11-02 DIAGNOSIS — R109 Unspecified abdominal pain: Secondary | ICD-10-CM | POA: Diagnosis present

## 2018-11-02 DIAGNOSIS — Z87891 Personal history of nicotine dependence: Secondary | ICD-10-CM | POA: Diagnosis not present

## 2018-11-02 DIAGNOSIS — E876 Hypokalemia: Secondary | ICD-10-CM | POA: Insufficient documentation

## 2018-11-02 DIAGNOSIS — Z79899 Other long term (current) drug therapy: Secondary | ICD-10-CM | POA: Insufficient documentation

## 2018-11-02 LAB — I-STAT BETA HCG BLOOD, ED (MC, WL, AP ONLY): I-stat hCG, quantitative: <5 m[IU]/mL (ref <5)

## 2018-11-02 LAB — COMPREHENSIVE METABOLIC PANEL
ALT: 21 U/L (ref 0–44)
AST: 25 U/L (ref 15–41)
Albumin: 4.4 g/dL (ref 3.5–5.0)
Alkaline Phosphatase: 72 U/L (ref 38–126)
Anion gap: 14 (ref 5–15)
BUN: <5 mg/dL — ABNORMAL LOW (ref 6–20)
CO2: 29 mmol/L (ref 22–32)
Calcium: 9.8 mg/dL (ref 8.9–10.3)
Chloride: 97 mmol/L — ABNORMAL LOW (ref 98–111)
Creatinine, Ser: 0.91 mg/dL (ref 0.44–1.00)
GFR calc Af Amer: >60 mL/min (ref >60)
GFR calc non Af Amer: >60 mL/min (ref >60)
Glucose, Bld: 145 mg/dL — ABNORMAL HIGH (ref 70–99)
Potassium: 3.3 mmol/L — ABNORMAL LOW (ref 3.5–5.1)
Sodium: 140 mmol/L (ref 135–145)
Total Bilirubin: 0.7 mg/dL (ref 0.3–1.2)
Total Protein: 7.9 g/dL (ref 6.5–8.1)

## 2018-11-02 LAB — CBC
HCT: 43 % (ref 36.0–46.0)
Hemoglobin: 14.5 g/dL (ref 12.0–15.0)
MCH: 28.2 pg (ref 26.0–34.0)
MCHC: 33.7 g/dL (ref 30.0–36.0)
MCV: 83.7 fL (ref 80.0–100.0)
Platelets: 344 10*3/uL (ref 150–400)
RBC: 5.14 MIL/uL — ABNORMAL HIGH (ref 3.87–5.11)
RDW: 14.6 % (ref 11.5–15.5)
WBC: 8.7 10*3/uL (ref 4.0–10.5)
nRBC: 0 % (ref 0.0–0.2)

## 2018-11-02 LAB — ETHANOL: Alcohol, Ethyl (B): <10 mg/dL (ref <10)

## 2018-11-02 LAB — LIPASE, BLOOD: Lipase: 112 U/L — ABNORMAL HIGH (ref 11–51)

## 2018-11-02 MED ORDER — HYDROMORPHONE HCL 1 MG/ML IJ SOLN
0.5000 mg | Freq: Once | INTRAMUSCULAR | Status: AC
  Administered 2018-11-02: 0.5 mg via INTRAVENOUS
  Filled 2018-11-02: qty 1

## 2018-11-02 MED ORDER — PANTOPRAZOLE SODIUM 40 MG IV SOLR
40.0000 mg | Freq: Once | INTRAVENOUS | Status: AC
  Administered 2018-11-02: 40 mg via INTRAVENOUS
  Filled 2018-11-02: qty 40

## 2018-11-02 MED ORDER — ONDANSETRON HCL 4 MG PO TABS: 4.0000 mg | ORAL_TABLET | Freq: Three times a day (TID) | ORAL | 0 refills | Status: DC | PRN

## 2018-11-02 MED ORDER — SODIUM CHLORIDE 0.9% FLUSH: 3.0000 mL | Freq: Once | INTRAVENOUS | Status: DC

## 2018-11-02 MED ORDER — SUCRALFATE 1 G PO TABS: 1.0000 g | ORAL_TABLET | Freq: Three times a day (TID) | ORAL | 0 refills | Status: DC

## 2018-11-02 MED ORDER — SODIUM CHLORIDE 0.9 % IV BOLUS
1000.0000 mL | Freq: Once | INTRAVENOUS | Status: AC
  Administered 2018-11-02: 1000 mL via INTRAVENOUS

## 2018-11-02 MED ORDER — POTASSIUM CHLORIDE CRYS ER 20 MEQ PO TBCR: 40.0000 meq | EXTENDED_RELEASE_TABLET | Freq: Once | ORAL | Status: DC

## 2018-11-02 MED ORDER — HALOPERIDOL LACTATE 5 MG/ML IJ SOLN
2.0000 mg | Freq: Once | INTRAMUSCULAR | Status: AC
  Administered 2018-11-02: 2 mg via INTRAMUSCULAR
  Filled 2018-11-02: qty 1

## 2018-11-02 MED ORDER — FAMOTIDINE 20 MG PO TABS: 20.0000 mg | ORAL_TABLET | Freq: Two times a day (BID) | ORAL | 0 refills | Status: DC

## 2018-11-02 MED ORDER — METOCLOPRAMIDE HCL 5 MG/ML IJ SOLN
10.0000 mg | Freq: Once | INTRAMUSCULAR | Status: AC
  Administered 2018-11-02: 10 mg via INTRAVENOUS
  Filled 2018-11-02: qty 2

## 2018-11-02 NOTE — Discharge Instructions (Signed)
Start taking Zofran as needed for nausea and vomiting.  Let this medicine dissolve under your tongue and wait around 10 to 15 minutes before you have anything to eat or drink.  Take Pepcid twice daily with meals.  Take Carafate with meals and at night.  Eat a diet of bland foods that will not upset your stomach.  Avoid fried foods, spicy foods, acidic foods, or alcohol.  I would recommend starting with only liquids for the first 2 or 3 days including broth, water, etc and moving on to bland solid foods.  Follow-up with your primary care provider for reevaluation of your symptoms.  Return to the emergency department if any concerning signs or symptoms develop such as severe pain, persistent vomiting, or fevers.

## 2018-11-02 NOTE — ED Notes (Signed)
Pt vomiting, unable to tolerate PO challenge. Rodell Perna, [place] notified.
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
## 2018-11-02 NOTE — ED Notes (Signed)
0.5mg  Dilaudid wasted in sink. Witnessed by Barbie Banner, RN

## 2018-11-02 NOTE — ED Triage Notes (Signed)
Report from [facility]> Pt from home.  C/o lower abd pain, nausea, and vomiting since 11pm last night.  Hx of pancreatitis.  Pt reported to EMS that she vomited blood.  No active vomiting with EMS.

## 2018-11-02 NOTE — ED Notes (Signed)
Patient Alert and oriented to baseline. Stable and ambulatory to baseline. Patient verbalized understanding of the discharge instructions.  Patient belongings were taken by the patient.   

## 2018-11-04 ENCOUNTER — Other Ambulatory Visit: Payer: Self-pay | Admitting: Family Medicine

## 2018-11-13 ENCOUNTER — Ambulatory Visit: Payer: Medicaid Other | Admitting: Family Medicine

## 2018-11-13 ENCOUNTER — Other Ambulatory Visit: Payer: Self-pay

## 2018-11-19 ENCOUNTER — Telehealth: Payer: Self-pay | Admitting: Family Medicine

## 2018-11-19 MED ORDER — METRONIDAZOLE 500 MG PO TABS: 500.0000 mg | ORAL_TABLET | Freq: Two times a day (BID) | ORAL | 0 refills | Status: DC

## 2018-11-19 NOTE — Telephone Encounter (Signed)
She thinks she has trichomonis again and would like for you to call in a prescription for herself and her husband to [place] please if you can.  Her phone number is [phone].

## 2018-11-27 ENCOUNTER — Ambulatory Visit (INDEPENDENT_AMBULATORY_CARE_PROVIDER_SITE_OTHER): Payer: Medicaid Other | Admitting: Family Medicine

## 2018-11-27 ENCOUNTER — Encounter: Payer: Self-pay | Admitting: Family Medicine

## 2018-11-27 ENCOUNTER — Other Ambulatory Visit: Payer: Self-pay

## 2018-11-27 VITALS — BP 116/76 | HR 103 | Wt 207.2 lb

## 2018-11-27 DIAGNOSIS — R1013 Epigastric pain: Secondary | ICD-10-CM | POA: Diagnosis not present

## 2018-11-27 DIAGNOSIS — R739 Hyperglycemia, unspecified: Secondary | ICD-10-CM

## 2018-11-27 DIAGNOSIS — G8929 Other chronic pain: Secondary | ICD-10-CM | POA: Diagnosis not present

## 2018-11-27 LAB — POCT GLYCOSYLATED HEMOGLOBIN (HGB A1C): Hemoglobin A1C: 5.7 % — AB (ref 4.0–5.6)

## 2018-11-27 MED ORDER — SUCRALFATE 1 G PO TABS: 1.0000 g | ORAL_TABLET | Freq: Three times a day (TID) | ORAL | 5 refills | Status: DC

## 2018-11-27 MED ORDER — FAMOTIDINE 20 MG PO TABS: 20.0000 mg | ORAL_TABLET | Freq: Two times a day (BID) | ORAL | 3 refills | Status: DC

## 2018-11-27 NOTE — Patient Instructions (Signed)
Great to see you!If you think a negative COVID  test result means you don't have coronavirus, you could be wrong.   1. Does a negative test mean you do not have COVID?    Does a negative COVID test result  mean it's safe to visit family? Not necessarily. It can take days before a new infection shows up on a Covid-19 test. You can be infected and contagious and the test can be negative.  We know that the incubation period for Covid-19 is up to 14 days. And before that, you can be testing negative, and have no symptoms.  You could actually be harboring the virus and be able to transmit it to others.  2. If I got infected yesterday, would a test taken today pick that up? Probably not. Studies have shown that if you have onset of symptoms and count that  as Day 1, a test taken during the four days before that (when you were infected but NOT showing symptoms) is FALSELY NEGATIVE 99% of the time. Test shows you are not infected, but  you really are.  On the day people started showing symptoms, the average false-negative rate had dropped to 38%, according to the study. Three days after symptoms started, the false-negative rate dropped to 20%. The virus just takes time to replicate in the body to levels the test can pick up.  3. So how many days should a person wait after possible exposure to get tested? There is no hard and fast rule, but the evidence suggests getting a test before the third or fourth  day after exposure is not of much use.   4. Could I be contagious while testing negative? Absolutely. People feel like if you test (negative), you're out of the woods, but you're not. One reason why this virus spreads so easily is because people can be infectious without any symptoms. The [name] Centers for Disease Control and Prevention estimates 40% of infections are asymptomatic, and 50% of transmissions happen before symptoms begin.  5. So what should I do if I want to see friends or relatives? If you  insist on seeing anyone who doesn't live with you, self-quarantining for 14 days beforehand is best. If you do that properly, you don't need a test. To be clear: Quarantining means staying home. It does not mean you can run errands. 'Grocery store' and 'quarantine' don't belong in the same sentence. There have already been cases of coronavirus spread within families just days after a person tested negative, Remember to treat family from different households the same way you would treat unrelated friends or work Medical laboratory scientific officer during this pandemic.   6. What is the best way to celebrate with family over the holidays? Remotely.

## 2018-11-28 ENCOUNTER — Ambulatory Visit: Payer: Medicaid Other | Admitting: Family Medicine

## 2018-11-28 ENCOUNTER — Encounter: Payer: Self-pay | Admitting: Family Medicine

## 2018-11-28 NOTE — Assessment & Plan Note (Signed)
We will continue her famotidine and sucralfate.

## 2018-11-28 NOTE — Progress Notes (Signed)
Patient did not keep appointment today. She may call to reschedule.  

## 2018-11-28 NOTE — Assessment & Plan Note (Signed)
Will check A1c today. 

## 2018-11-28 NOTE — Progress Notes (Signed)
    CHIEF COMPLAINT / HPI: #1.  Hyperglycemia.  Was seen at the emergency department for nausea vomiting abdominal pain.  Random glucose drawn showed elevation.  She is here for follow-up 2.  Regarding her chronic abdominal pain with episodic nausea and vomiting, they placed her on sucralfate and famotidine and she is seeing some improvement. 3.  Recent treatment for STI.  Symptoms have resolved.  REVIEW OF SYSTEMS: No unusual weight change.  No fever.  No cough.  PERTINENT  PMH / PSH: I have reviewed the patient's medications, allergies, past medical and surgical history, smoking status and updated in the EMR as appropriate.   OBJECTIVE:  Vital signs reviewed. GENERAL: Well-developed, well-nourished, no acute distress. CARDIOVASCULAR: Regular rate and rhythm no murmur gallop or rub LUNGS: Clear to auscultation bilaterally, no rales or wheeze. ABDOMEN: Soft positive bowel sounds NEURO: No gross focal neurological deficits. MSK: Movement of extremity x 4.    ASSESSMENT / PLAN:   Hyperglycemia Will check A1c today.  Abdominal pain, chronic, epigastric We will continue her famotidine and sucralfate.

## 2018-11-29 ENCOUNTER — Encounter: Payer: Self-pay | Admitting: Family Medicine

## 2018-12-05 ENCOUNTER — Other Ambulatory Visit: Payer: Self-pay | Admitting: Family Medicine

## 2018-12-05 ENCOUNTER — Telehealth: Payer: Self-pay | Admitting: Family Medicine

## 2018-12-05 DIAGNOSIS — Z202 Contact with and (suspected) exposure to infections with a predominantly sexual mode of transmission: Secondary | ICD-10-CM

## 2018-12-05 MED ORDER — METRONIDAZOLE 500 MG PO TABS: 2000.0000 mg | ORAL_TABLET | Freq: Once | ORAL | 0 refills | Status: AC

## 2018-12-05 NOTE — Telephone Encounter (Signed)
Note entered in Error. 
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
## 2018-12-10 ENCOUNTER — Ambulatory Visit: Payer: Medicaid Other

## 2018-12-13 ENCOUNTER — Other Ambulatory Visit: Payer: Self-pay

## 2018-12-13 ENCOUNTER — Ambulatory Visit
Admission: RE | Admit: 2018-12-13 | Discharge: 2018-12-13 | Disposition: A | Discharge status: Home / Self Care | Payer: Medicaid Other | Source: Ambulatory Visit | Attending: Family Medicine | Admitting: Family Medicine

## 2018-12-13 ENCOUNTER — Telehealth (INDEPENDENT_AMBULATORY_CARE_PROVIDER_SITE_OTHER): Payer: Medicaid Other | Admitting: Family Medicine

## 2018-12-13 DIAGNOSIS — B373 Candidiasis of vulva and vagina: Secondary | ICD-10-CM

## 2018-12-13 DIAGNOSIS — B3731 Acute candidiasis of vulva and vagina: Secondary | ICD-10-CM

## 2018-12-13 DIAGNOSIS — Z1231 Encounter for screening mammogram for malignant neoplasm of breast: Secondary | ICD-10-CM

## 2018-12-13 MED ORDER — FLUCONAZOLE 150 MG PO TABS: 150.0000 mg | ORAL_TABLET | Freq: Once | ORAL | 0 refills | Status: AC

## 2018-12-13 NOTE — Progress Notes (Addendum)
Virtual Visit via Telephone Note  I connected with Peri Longenecker Brown-Turrell on 12/13/18 at  8:45 AM EST by telephone and verified that I am speaking with the correct person using two identifiers.  Location: Patient: Holly Mendez   I discussed the limitations, risks, security and privacy concerns of performing an evaluation and management service by telephone and the availability of in person appointments. I also discussed with the patient that there may be a patient responsible charge related to this service. The patient expressed understanding and agreed to proceed.   History of Present Illness:  Patient recently treated for trichamonas. Having a milky discharge as of two days ago with stinging and burning in the vaginal area. No smell but very irritating to patient. No dysuria. No fevers. Has an appointment Wednesday for a wet prep. Patient requests a medication to help treat the vaginal burning/itching sensation until she can come on Wednesday for [facility] appointment.   Observations/Objective: No signs of breathing difficulty during phone call.  Assessment and Plan:  Sounds very consistent with vaginal candidiasis with a burning/stringing sensation and a milky vaginal discharge, particularly as it is occurring after antibiotics for trichomonas. Patient still plans to have a wet prep completed on Wednesday but requests something to help with the discomfort until then.  Plan: - Will proscribe diflucan x 1 - Patient can also try monistat cream in addition to the diflucan. - Patient to keep appointment on Wednesday for wet prep and further workup   Follow Up Instructions:   I discussed the assessment and treatment plan with the patient. The patient was provided an opportunity to ask questions and all were answered. The patient agreed with the plan and demonstrated an understanding of the instructions.   The patient was advised to call back or seek an in-person evaluation if the symptoms worsen  or if the condition fails to improve as anticipated.  I provided 8 minutes of non-face-to-face time during this encounter.   Lurline Del, DO

## 2018-12-18 ENCOUNTER — Ambulatory Visit (INDEPENDENT_AMBULATORY_CARE_PROVIDER_SITE_OTHER): Payer: Medicaid Other | Admitting: Family Medicine

## 2018-12-18 ENCOUNTER — Encounter: Payer: Self-pay | Admitting: Family Medicine

## 2018-12-18 ENCOUNTER — Other Ambulatory Visit (HOSPITAL_COMMUNITY)
Admission: RE | Admit: 2018-12-18 | Discharge: 2018-12-18 | Disposition: A | Discharge status: Home / Self Care | Payer: Medicaid Other | Source: Ambulatory Visit | Attending: Family Medicine | Admitting: Family Medicine

## 2018-12-18 ENCOUNTER — Other Ambulatory Visit: Payer: Self-pay

## 2018-12-18 VITALS — BP 100/76 | HR 105 | Wt 208.0 lb

## 2018-12-18 DIAGNOSIS — N898 Other specified noninflammatory disorders of vagina: Secondary | ICD-10-CM

## 2018-12-18 LAB — POCT WET PREP (WET MOUNT)
Clue Cells Wet Prep Whiff POC: NEGATIVE
Trichomonas Wet Prep HPF POC: ABSENT

## 2018-12-19 ENCOUNTER — Telehealth: Payer: Self-pay | Admitting: Family Medicine

## 2018-12-19 NOTE — Progress Notes (Signed)
    CHIEF COMPLAINT / HPI: Follow-up treatment for vaginal discharge, recurrent bacterial vaginosis, recurrent trichomoniasis.  Symptoms have resolved.  Her husband seems to be having some penile irritation but her symptoms have totally resolved.  REVIEW OF SYSTEMS: No unusual weight change, no cough.  No abdominal pain  PERTINENT  PMH / PSH: I have reviewed the patient's medications, allergies, past medical and surgical history, smoking status and updated in the EMR as appropriate.   OBJECTIVE: GENERAL: Well-developed female no acute distress GU: Externally normal.  No adnexal masses or tenderness.  Cervix appears normal.  Wet prep, GC and chlamydia samples taken.  ASSESSMENT / PLAN:  Test of cure.  I will call her with results. No problem-specific Assessment & Plan notes found for this encounter.

## 2018-12-19 NOTE — Telephone Encounter (Signed)
Will forward to MD. Rayvion Stumph,CMA  

## 2018-12-19 NOTE — Telephone Encounter (Signed)
Pt is calling and asks that Dr. Nori Riis call her asap with her results from yesterday. Pt states that she knows Dr. Nori Riis said she would call today but she is worried there is something wrong with her and her husband that will be need to be treated with medication and is worried the pharmacy will be closed.   Pt would like for someone to call her when results are available and to let her know if they need medication.

## 2018-12-20 LAB — CERVICOVAGINAL ANCILLARY ONLY
Chlamydia: NEGATIVE
Comment: NEGATIVE
Comment: NORMAL
Neisseria Gonorrhea: NEGATIVE

## 2018-12-20 NOTE — Telephone Encounter (Signed)
Patient informed of results. Holly Mendez,CMA  

## 2018-12-20 NOTE — Telephone Encounter (Signed)
Dear Holly Mendez Team Wet prep and test for trichomonas were both negative. The GC and chlamydia testing have not been resulted yet. Should have them by now butthere must be a delay at the lab. I will let her know Monday about those. [name]

## 2018-12-20 NOTE — Telephone Encounter (Signed)
Patient calling about her test results from Wednesday's appt. Patient worried and thought she would have a call back yesterday. Please call patient with results when you get a chance.

## 2018-12-20 NOTE — Telephone Encounter (Signed)
Pt calls again for results. Holly Mendez, CMA

## 2018-12-21 ENCOUNTER — Other Ambulatory Visit: Payer: Self-pay | Admitting: Family Medicine

## 2018-12-23 NOTE — Telephone Encounter (Signed)
Patient calling about her test results again. She said when Jazmin called on Friday, she was in a public place where she could not respond/discuss this information, but she already knew about her negative wet prep and trichomonas results and wanted the rest of her results. Informed patient of the delay in the lab results right now which is why she hasn't been informed of them yet and that Dr. Nori Riis will call her when they are available. Patient was understanding, just worried, and apologized for so many phone calls.

## 2018-12-24 ENCOUNTER — Telehealth: Payer: Self-pay

## 2018-12-24 NOTE — Telephone Encounter (Signed)
Dear [name] Team All of her tests were NEGATIVE. I am not sure what his symnptoms are from but please have him pick up and take the antibiotic I am calling in for him. IF his symptoms do not TOTALLY resolve after he completes the memtronidazole, he needs to let us know. THANKS! [name]

## 2018-12-24 NOTE — Telephone Encounter (Signed)
Pt calling for results from test done on the 9th. Pt states that husband is still having Sx and needs something called in. Pt can be reached at [phone]. Pt would like a call back as soon as we can. [name], CMA

## 2018-12-24 NOTE — Telephone Encounter (Signed)
Pt informed of below.Holly Mendez, CMA ? ?

## 2018-12-30 ENCOUNTER — Other Ambulatory Visit: Payer: Self-pay

## 2018-12-30 ENCOUNTER — Emergency Department (HOSPITAL_COMMUNITY)
Admission: EM | Admit: 2018-12-30 | Discharge: 2018-12-30 | Disposition: A | Discharge status: Home / Self Care | Payer: Medicaid Other | Attending: Emergency Medicine | Admitting: Emergency Medicine

## 2018-12-30 ENCOUNTER — Encounter (HOSPITAL_COMMUNITY): Payer: Self-pay | Admitting: Emergency Medicine

## 2018-12-30 DIAGNOSIS — K861 Other chronic pancreatitis: Secondary | ICD-10-CM | POA: Diagnosis not present

## 2018-12-30 DIAGNOSIS — I10 Essential (primary) hypertension: Secondary | ICD-10-CM | POA: Diagnosis not present

## 2018-12-30 DIAGNOSIS — Z87891 Personal history of nicotine dependence: Secondary | ICD-10-CM | POA: Diagnosis not present

## 2018-12-30 DIAGNOSIS — R109 Unspecified abdominal pain: Secondary | ICD-10-CM | POA: Diagnosis present

## 2018-12-30 DIAGNOSIS — J45909 Unspecified asthma, uncomplicated: Secondary | ICD-10-CM | POA: Diagnosis not present

## 2018-12-30 DIAGNOSIS — Z79899 Other long term (current) drug therapy: Secondary | ICD-10-CM | POA: Insufficient documentation

## 2018-12-30 DIAGNOSIS — R112 Nausea with vomiting, unspecified: Secondary | ICD-10-CM | POA: Diagnosis not present

## 2018-12-30 LAB — CBC WITH DIFFERENTIAL/PLATELET
Abs Immature Granulocytes: 0.04 10*3/uL (ref 0.00–0.07)
Basophils Absolute: 0 10*3/uL (ref 0.0–0.1)
Basophils Relative: 0 %
Eosinophils Absolute: 0 10*3/uL (ref 0.0–0.5)
Eosinophils Relative: 0 %
HCT: 39.2 % (ref 36.0–46.0)
Hemoglobin: 13.3 g/dL (ref 12.0–15.0)
Immature Granulocytes: 1 %
Lymphocytes Relative: 11 %
Lymphs Abs: 1 10*3/uL (ref 0.7–4.0)
MCH: 27.3 pg (ref 26.0–34.0)
MCHC: 33.9 g/dL (ref 30.0–36.0)
MCV: 80.5 fL (ref 80.0–100.0)
Monocytes Absolute: 0.3 10*3/uL (ref 0.1–1.0)
Monocytes Relative: 3 %
Neutro Abs: 7.4 10*3/uL (ref 1.7–7.7)
Neutrophils Relative %: 85 %
Platelets: 380 10*3/uL (ref 150–400)
RBC: 4.87 MIL/uL (ref 3.87–5.11)
RDW: 15.4 % (ref 11.5–15.5)
WBC: 8.7 10*3/uL (ref 4.0–10.5)
nRBC: 0 % (ref 0.0–0.2)

## 2018-12-30 LAB — COMPREHENSIVE METABOLIC PANEL
ALT: 31 U/L (ref 0–44)
AST: 47 U/L — ABNORMAL HIGH (ref 15–41)
Albumin: 4.5 g/dL (ref 3.5–5.0)
Alkaline Phosphatase: 92 U/L (ref 38–126)
Anion gap: 18 — ABNORMAL HIGH (ref 5–15)
BUN: 15 mg/dL (ref 6–20)
CO2: 24 mmol/L (ref 22–32)
Calcium: 9.6 mg/dL (ref 8.9–10.3)
Chloride: 95 mmol/L — ABNORMAL LOW (ref 98–111)
Creatinine, Ser: 1.3 mg/dL — ABNORMAL HIGH (ref 0.44–1.00)
GFR calc Af Amer: 57 mL/min — ABNORMAL LOW (ref >60)
GFR calc non Af Amer: 50 mL/min — ABNORMAL LOW (ref >60)
Glucose, Bld: 128 mg/dL — ABNORMAL HIGH (ref 70–99)
Potassium: 3.8 mmol/L (ref 3.5–5.1)
Sodium: 137 mmol/L (ref 135–145)
Total Bilirubin: 0.9 mg/dL (ref 0.3–1.2)
Total Protein: 8.4 g/dL — ABNORMAL HIGH (ref 6.5–8.1)

## 2018-12-30 LAB — MAGNESIUM: Magnesium: 2.3 mg/dL (ref 1.7–2.4)

## 2018-12-30 LAB — ETHANOL: Alcohol, Ethyl (B): <10 mg/dL (ref <10)

## 2018-12-30 LAB — LIPASE, BLOOD: Lipase: 52 U/L — ABNORMAL HIGH (ref 11–51)

## 2018-12-30 MED ORDER — LACTATED RINGERS IV BOLUS
1000.0000 mL | Freq: Once | INTRAVENOUS | Status: AC
  Administered 2018-12-30: 1000 mL via INTRAVENOUS

## 2018-12-30 MED ORDER — PANTOPRAZOLE SODIUM 40 MG IV SOLR
40.0000 mg | Freq: Once | INTRAVENOUS | Status: AC
  Administered 2018-12-30: 40 mg via INTRAVENOUS
  Filled 2018-12-30: qty 40

## 2018-12-30 MED ORDER — ESOMEPRAZOLE MAGNESIUM 40 MG PO CPDR: 40.0000 mg | DELAYED_RELEASE_CAPSULE | Freq: Every day | ORAL | 0 refills | Status: DC

## 2018-12-30 MED ORDER — PROMETHAZINE HCL 25 MG/ML IJ SOLN
25.0000 mg | Freq: Once | INTRAMUSCULAR | Status: AC
  Administered 2018-12-30: 25 mg via INTRAMUSCULAR
  Filled 2018-12-30: qty 1

## 2018-12-30 MED ORDER — MORPHINE SULFATE (PF) 4 MG/ML IV SOLN
4.0000 mg | Freq: Once | INTRAVENOUS | Status: AC
  Administered 2018-12-30: 08:00:00 4 mg via INTRAVENOUS
  Filled 2018-12-30: qty 1

## 2018-12-30 MED ORDER — PROMETHAZINE HCL 25 MG RE SUPP: 25.0000 mg | Freq: Three times a day (TID) | RECTAL | 0 refills | Status: DC | PRN

## 2018-12-30 MED ORDER — MORPHINE SULFATE (PF) 4 MG/ML IV SOLN
4.0000 mg | Freq: Once | INTRAVENOUS | Status: AC
  Administered 2018-12-30: 4 mg via INTRAVENOUS
  Filled 2018-12-30: qty 1

## 2018-12-30 MED ORDER — LACTATED RINGERS IV BOLUS
1000.0000 mL | Freq: Once | INTRAVENOUS | Status: AC
  Administered 2018-12-30: 08:00:00 1000 mL via INTRAVENOUS

## 2018-12-30 MED ORDER — CAPSAICIN 0.025 % EX CREA
TOPICAL_CREAM | Freq: Two times a day (BID) | CUTANEOUS | Status: DC
  Filled 2018-12-30: qty 60

## 2018-12-30 MED ORDER — LORAZEPAM 2 MG/ML IJ SOLN
1.0000 mg | Freq: Once | INTRAMUSCULAR | Status: AC
  Administered 2018-12-30: 10:00:00 1 mg via INTRAVENOUS
  Filled 2018-12-30: qty 1

## 2018-12-30 NOTE — ED Triage Notes (Signed)
Pt arrives via EMS for abd pain- hx of pancreatitis. Pt shaking, yelling in pain. Complains of vomiting and abd pain since yesterday. HR 116, 100% on room air

## 2018-12-30 NOTE — ED Notes (Signed)
Pt tolerated ginger ale and crackers.
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
## 2019-01-02 ENCOUNTER — Other Ambulatory Visit: Payer: Self-pay | Admitting: Family Medicine

## 2019-01-04 ENCOUNTER — Other Ambulatory Visit: Payer: Self-pay

## 2019-01-04 ENCOUNTER — Encounter (HOSPITAL_COMMUNITY): Payer: Self-pay | Admitting: Emergency Medicine

## 2019-01-04 ENCOUNTER — Emergency Department (HOSPITAL_COMMUNITY)
Admission: EM | Admit: 2019-01-04 | Discharge: 2019-01-04 | Disposition: A | Discharge status: Home / Self Care | Payer: Medicaid Other | Attending: Emergency Medicine | Admitting: Emergency Medicine

## 2019-01-04 DIAGNOSIS — R109 Unspecified abdominal pain: Secondary | ICD-10-CM

## 2019-01-04 DIAGNOSIS — J45909 Unspecified asthma, uncomplicated: Secondary | ICD-10-CM | POA: Insufficient documentation

## 2019-01-04 DIAGNOSIS — R1013 Epigastric pain: Secondary | ICD-10-CM | POA: Diagnosis present

## 2019-01-04 DIAGNOSIS — Z87891 Personal history of nicotine dependence: Secondary | ICD-10-CM | POA: Diagnosis not present

## 2019-01-04 DIAGNOSIS — Z79899 Other long term (current) drug therapy: Secondary | ICD-10-CM | POA: Insufficient documentation

## 2019-01-04 DIAGNOSIS — G8929 Other chronic pain: Secondary | ICD-10-CM | POA: Insufficient documentation

## 2019-01-04 DIAGNOSIS — R112 Nausea with vomiting, unspecified: Secondary | ICD-10-CM | POA: Diagnosis not present

## 2019-01-04 DIAGNOSIS — I10 Essential (primary) hypertension: Secondary | ICD-10-CM | POA: Diagnosis not present

## 2019-01-04 LAB — COMPREHENSIVE METABOLIC PANEL WITH GFR
ALT: 28 U/L (ref 0–44)
AST: 24 U/L (ref 15–41)
Albumin: 3.9 g/dL (ref 3.5–5.0)
Alkaline Phosphatase: 79 U/L (ref 38–126)
Anion gap: 10 (ref 5–15)
BUN: 7 mg/dL (ref 6–20)
CO2: 23 mmol/L (ref 22–32)
Calcium: 9.3 mg/dL (ref 8.9–10.3)
Chloride: 107 mmol/L (ref 98–111)
Creatinine, Ser: 0.97 mg/dL (ref 0.44–1.00)
GFR calc Af Amer: >60 mL/min
GFR calc non Af Amer: >60 mL/min
Glucose, Bld: 122 mg/dL — ABNORMAL HIGH (ref 70–99)
Potassium: 3.7 mmol/L (ref 3.5–5.1)
Sodium: 140 mmol/L (ref 135–145)
Total Bilirubin: 0.2 mg/dL — ABNORMAL LOW (ref 0.3–1.2)
Total Protein: 7.5 g/dL (ref 6.5–8.1)

## 2019-01-04 LAB — RAPID URINE DRUG SCREEN, HOSP PERFORMED
Amphetamines: NOT DETECTED
Barbiturates: NOT DETECTED
Benzodiazepines: NOT DETECTED
Cocaine: POSITIVE — AB
Opiates: NOT DETECTED
Tetrahydrocannabinol: NOT DETECTED

## 2019-01-04 LAB — LIPASE, BLOOD: Lipase: 40 U/L (ref 11–51)

## 2019-01-04 LAB — URINALYSIS, ROUTINE W REFLEX MICROSCOPIC
Bacteria, UA: NONE SEEN
Bilirubin Urine: NEGATIVE
Glucose, UA: NEGATIVE mg/dL
Ketones, ur: 5 mg/dL — AB
Leukocytes,Ua: NEGATIVE
Nitrite: NEGATIVE
Protein, ur: NEGATIVE mg/dL
Specific Gravity, Urine: 1.013 (ref 1.005–1.030)
pH: 8 (ref 5.0–8.0)

## 2019-01-04 LAB — CBC
HCT: 36.3 % (ref 36.0–46.0)
Hemoglobin: 12.2 g/dL (ref 12.0–15.0)
MCH: 27.8 pg (ref 26.0–34.0)
MCHC: 33.6 g/dL (ref 30.0–36.0)
MCV: 82.7 fL (ref 80.0–100.0)
Platelets: 288 10*3/uL (ref 150–400)
RBC: 4.39 MIL/uL (ref 3.87–5.11)
RDW: 15.6 % — ABNORMAL HIGH (ref 11.5–15.5)
WBC: 12.4 10*3/uL — ABNORMAL HIGH (ref 4.0–10.5)
nRBC: 0 % (ref 0.0–0.2)

## 2019-01-04 LAB — I-STAT BETA HCG BLOOD, ED (MC, WL, AP ONLY)
I-stat hCG, quantitative: <5 m[IU]/mL (ref <5)
I-stat hCG, quantitative: <5 m[IU]/mL (ref <5)

## 2019-01-04 MED ORDER — SODIUM CHLORIDE 0.9% FLUSH
3.0000 mL | Freq: Once | INTRAVENOUS | Status: AC
  Administered 2019-01-04: 3 mL via INTRAVENOUS

## 2019-01-04 MED ORDER — DICYCLOMINE HCL 10 MG/ML IM SOLN
10.0000 mg | Freq: Once | INTRAMUSCULAR | Status: AC
  Administered 2019-01-04: 10 mg via INTRAMUSCULAR
  Filled 2019-01-04: qty 2

## 2019-01-04 MED ORDER — SODIUM CHLORIDE 0.9 % IV BOLUS
1000.0000 mL | Freq: Once | INTRAVENOUS | Status: AC
  Administered 2019-01-04: 1000 mL via INTRAVENOUS

## 2019-01-04 MED ORDER — PANTOPRAZOLE SODIUM 40 MG IV SOLR
40.0000 mg | Freq: Once | INTRAVENOUS | Status: AC
  Administered 2019-01-04: 40 mg via INTRAVENOUS
  Filled 2019-01-04: qty 40

## 2019-01-04 MED ORDER — HALOPERIDOL LACTATE 5 MG/ML IJ SOLN
2.0000 mg | Freq: Once | INTRAMUSCULAR | Status: AC
  Administered 2019-01-04: 2 mg via INTRAVENOUS
  Filled 2019-01-04: qty 1

## 2019-01-04 MED ORDER — LORAZEPAM 2 MG/ML IJ SOLN
1.0000 mg | Freq: Once | INTRAMUSCULAR | Status: AC
  Administered 2019-01-04: 1 mg via INTRAVENOUS
  Filled 2019-01-04: qty 1

## 2019-01-04 MED ORDER — ONDANSETRON HCL 4 MG/2ML IJ SOLN: 4.0000 mg | Freq: Once | INTRAMUSCULAR | Status: DC | PRN

## 2019-01-04 NOTE — ED Notes (Signed)
Pt assisted to bathroom by emt.

## 2019-01-04 NOTE — ED Provider Notes (Signed)
Care assumed from [facility], PA-C at shift change with reevaluation after medications pending.  In brief, this patient is a 43 year old female with past medical history of asthma, bipolar/schizoaffective disorder, chronic pancreatitis, fibromyalgia who presents for evaluation of flare of her chronic pancreatitis.  She reports pain in epigastric region that radiates towards her lower abdomen.  She reports associated vomiting and has had difficulty tolerating p.o.  Her last bowel movement was approximately 2 days ago.  She denies any fever, melena, hematochezia.  She denies any recent alcohol or drug use.  Please see note from previous provider for full history/physical exam.   Physical Exam  BP (!) 151/83   Pulse 81   Temp 98.9 F (37.2 C) (Oral)   Resp 16   Ht 5\' 5"  (1.651 m)   Wt 85.7 kg   SpO2 100%   BMI 31.45 kg/m   Physical Exam  Abdomen soft, nondistended.  Patient is resting comfortably while ice palpate her abdomen with no signs of distress.  ED Course/Procedures     Procedures  Results for orders placed or performed during the hospital encounter of 01/04/19 (from the past 24 hour(s))  Lipase, blood     Status: None   Collection Time: 01/04/19  1:37 AM  Result Value Ref Range   Lipase 40 11 - 51 U/L  Comprehensive metabolic panel     Status: Abnormal   Collection Time: 01/04/19  1:37 AM  Result Value Ref Range   Sodium 140 135 - 145 mmol/L   Potassium 3.7 3.5 - 5.1 mmol/L   Chloride 107 98 - 111 mmol/L   CO2 23 22 - 32 mmol/L   Glucose, Bld 122 (H) 70 - 99 mg/dL   BUN 7 6 - 20 mg/dL   Creatinine, Ser 0.97 0.44 - 1.00 mg/dL   Calcium 9.3 8.9 - 10.3 mg/dL   Total Protein 7.5 6.5 - 8.1 g/dL   Albumin 3.9 3.5 - 5.0 g/dL   AST 24 15 - 41 U/L   ALT 28 0 - 44 U/L   Alkaline Phosphatase 79 38 - 126 U/L   Total Bilirubin 0.2 (L) 0.3 - 1.2 mg/dL   GFR calc non Af Amer >60 >60 mL/min   GFR calc Af Amer >60 >60 mL/min   Anion gap 10 5 - 15  CBC     Status: Abnormal   Collection Time: 01/04/19  1:37 AM  Result Value Ref Range   WBC 12.4 (H) 4.0 - 10.5 K/uL   RBC 4.39 3.87 - 5.11 MIL/uL   Hemoglobin 12.2 12.0 - 15.0 g/dL   HCT 36.3 36.0 - 46.0 %   MCV 82.7 80.0 - 100.0 fL   MCH 27.8 26.0 - 34.0 pg   MCHC 33.6 30.0 - 36.0 g/dL   RDW 15.6 (H) 11.5 - 15.5 %   Platelets 288 150 - 400 K/uL   nRBC 0.0 0.0 - 0.2 %  I-Stat beta hCG blood, ED     Status: None   Collection Time: 01/04/19  1:42 AM  Result Value Ref Range   I-stat hCG, quantitative <5.0 <5 mIU/mL   Comment 3          I-Stat beta hCG blood, ED     Status: None   Collection Time: 01/04/19  5:41 AM  Result Value Ref Range   I-stat hCG, quantitative <5.0 <5 mIU/mL   Comment 3          Urinalysis, Routine w reflex microscopic  Status: Abnormal   Collection Time: 01/04/19 11:20 AM  Result Value Ref Range   Color, Urine STRAW (A) YELLOW   APPearance CLEAR CLEAR   Specific Gravity, Urine 1.013 1.005 - 1.030   pH 8.0 5.0 - 8.0   Glucose, UA NEGATIVE NEGATIVE mg/dL   Hgb urine dipstick MODERATE (A) NEGATIVE   Bilirubin Urine NEGATIVE NEGATIVE   Ketones, ur 5 (A) NEGATIVE mg/dL   Protein, ur NEGATIVE NEGATIVE mg/dL   Nitrite NEGATIVE NEGATIVE   Leukocytes,Ua NEGATIVE NEGATIVE   RBC / HPF 0-5 0 - 5 RBC/hpf   WBC, UA 0-5 0 - 5 WBC/hpf   Bacteria, UA NONE SEEN NONE SEEN   Squamous Epithelial / LPF 0-5 0 - 5   Mucus PRESENT   Urine rapid drug screen (hosp performed)     Status: Abnormal   Collection Time: 01/04/19 11:20 AM  Result Value Ref Range   Opiates NONE DETECTED NONE DETECTED   Cocaine POSITIVE (A) NONE DETECTED   Benzodiazepines NONE DETECTED NONE DETECTED   Amphetamines NONE DETECTED NONE DETECTED   Tetrahydrocannabinol NONE DETECTED NONE DETECTED   Barbiturates NONE DETECTED NONE DETECTED    MDM   PLAN: Patient pending medications and reevaluation.  MDM:  Labs at baseline.  Patient has had 5 - CTs within the last year.  She was most recently seen about 5 days ago for  similar symptoms.  Reevaluation.  Patient is resting comfortably.  UA shows moderate hemoglobin.  No evidence of infection allergy.  UDS does show some cocaine.  Reevaluation.  Patient is resting comfortably on bed.  I am able palpate her abdomen while she shows no signs of distress.  She has been observed for over 9 hours with no repeat vomiting after initial medications.  At this time, I feel that patient is stable for discharge.  Given reassuring exam, do not feel that she needs CT abdomen pelvis that she does not have any indication for surgical abdomen. At this time, patient exhibits no emergent life-threatening condition that require further evaluation in ED or admission. Patient had ample opportunity for questions and discussion. All patient's questions were answered with full understanding.    1. Chronic abdominal pain   2. Nausea and vomiting, intractability of vomiting not specified, unspecified vomiting type    Portions of this note were generated with Dragon dictation software. Dictation errors may occur despite best attempts at proofreading.    Volanda Napoleon, PA-C 01/04/19 1547    Gareth Morgan, MD 01/04/19 2159

## 2019-01-04 NOTE — Discharge Instructions (Addendum)
Follow up with your primary care doctor.   Return to the Emergency Department immediately if you experience any worsening abdominal pain, fever, persistent nausea and vomiting, inability keep any food down, pain with urination, blood in your urine or any other worsening or concerning symptoms. 

## 2019-01-04 NOTE — ED Triage Notes (Signed)
Per EMS, Pt reports a "flair up of her pancreatitis."  She reports it flaired up earlier in the week after some alcohol however tonight she denies any alcohol instead she states the fried food she ate "set it off."  Pt crying in triage, states pain started around 2pm tonight.    HR 110 (pt crying and shaking in triage.)  100% on RA
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
## 2019-01-07 ENCOUNTER — Other Ambulatory Visit: Payer: Self-pay | Admitting: Obstetrics & Gynecology

## 2019-01-07 DIAGNOSIS — N920 Excessive and frequent menstruation with regular cycle: Secondary | ICD-10-CM

## 2019-01-07 DIAGNOSIS — D250 Submucous leiomyoma of uterus: Secondary | ICD-10-CM

## 2019-01-16 ENCOUNTER — Other Ambulatory Visit: Payer: Self-pay | Admitting: Family Medicine

## 2019-01-27 ENCOUNTER — Other Ambulatory Visit: Payer: Self-pay | Admitting: Family Medicine

## 2019-02-03 ENCOUNTER — Telehealth: Payer: Self-pay | Admitting: *Deleted

## 2019-02-03 DIAGNOSIS — D250 Submucous leiomyoma of uterus: Secondary | ICD-10-CM

## 2019-02-03 DIAGNOSIS — N939 Abnormal uterine and vaginal bleeding, unspecified: Secondary | ICD-10-CM

## 2019-02-03 NOTE — Telephone Encounter (Signed)
Patient calls in stating that her period has been on for 2 months and the megesterol is not working.    Patient wants Dr. Nori Riis to call something else  In.  Ozella Almond, [place]

## 2019-02-03 NOTE — Telephone Encounter (Signed)
Dear [name] Team There really is NOT something different to call in. We had scheduled her for an OBGYN appt before Christmas and she no showed. Please ask her is she wasnts me to try and reschedule that?  If she does, she absolutely cannot miss the new appt. THANKS! [name]

## 2019-02-03 NOTE — Telephone Encounter (Signed)
Pt calls to let Dr. Nori Riis know that she has been having a period for 2 months straight despite being on megestrol.  She describes the period as not heavy with clots but constant.  She would like to know if something else can be given to her. Christen Bame, CMA

## 2019-02-04 NOTE — Telephone Encounter (Signed)
PT called and I gave her the below information and she said that yes she would like for you to get that rescheduled.  She also asked about the megesterol and I told her that you did not tell her to stop taking it in the message so to continue unless we contact her back to stop it. Holly Mendez, CMA

## 2019-02-05 ENCOUNTER — Encounter: Payer: Self-pay | Admitting: Family Medicine

## 2019-02-05 ENCOUNTER — Telehealth: Payer: Self-pay | Admitting: Family Medicine

## 2019-02-05 DIAGNOSIS — G8929 Other chronic pain: Secondary | ICD-10-CM

## 2019-02-05 NOTE — Telephone Encounter (Signed)
Holly Mendez I am putting this referral in as requested. She has seen several GI doctors so I am doing a summary of procedures she has already had. I will put a copy on your physical desk to send with the referral so she does not have unnecessary tests. Also She was seen at [facility] in 2018 (just [place]) THANKS! [name]

## 2019-02-05 NOTE — Telephone Encounter (Signed)
Patient found a gastro doctor who will see her, but Dr. Nori Riis would need to put in a referral before patient can schedule an appointment. The gastro doctor is Koleen Distance at [facility] at [employer]. Please let patient know when this referral as been placed/sent.

## 2019-02-10 ENCOUNTER — Telehealth: Payer: Self-pay | Admitting: General Practice

## 2019-02-10 NOTE — Telephone Encounter (Signed)
Patient called into front office stating she has been bleeding every single day since 11/7. She reports she isn't passing clots but she has been seeing blood every day since November. She states she has maybe two light days a week but the other days are typical period bleeding. She is taking Megace 40mg  twice a day. She states she was supposed to have a hysterectomy several times but it was cancelled each time for different reasons. Told patient I would reach out to Dr Kennon Rounds and see what she advised then I will call her back. Patient verbalized understanding and said to also let Dr Kennon Rounds know she is completely clean and isn't on anything. Told patient I would let her know.

## 2019-02-11 ENCOUNTER — Encounter: Payer: Self-pay | Admitting: Family Medicine

## 2019-02-11 ENCOUNTER — Telehealth (INDEPENDENT_AMBULATORY_CARE_PROVIDER_SITE_OTHER): Payer: Medicaid Other | Admitting: Family Medicine

## 2019-02-11 ENCOUNTER — Other Ambulatory Visit: Payer: Self-pay

## 2019-02-11 DIAGNOSIS — Z202 Contact with and (suspected) exposure to infections with a predominantly sexual mode of transmission: Secondary | ICD-10-CM | POA: Insufficient documentation

## 2019-02-11 DIAGNOSIS — A599 Trichomoniasis, unspecified: Secondary | ICD-10-CM

## 2019-02-11 MED ORDER — METRONIDAZOLE 500 MG PO TABS: 500.0000 mg | ORAL_TABLET | Freq: Two times a day (BID) | ORAL | 0 refills | Status: DC

## 2019-02-11 NOTE — Progress Notes (Signed)
[facility] Telemedicine Visit  Patient consented to have virtual visit. Method of visit: Telephone  Encounter participants: Patient: Holly Mendez - located at Home  Provider: Patriciaann Clan - located at [facility] Others (if applicable): "with company"   Chief Complaint: Exposure to Trich  HPI: Ms. Holly Mendez is a 44 year old female presenting discuss the following:  At beginning of conversation, reports that she is with "company" and does not want to say anything sensitive out loud.  Exposure to trichomonas: Recently exposed, state her partner had already been tested and treated for this.  They have not had sexual contact since that time.  Endorses increased yellow/green vaginal discharge, similar to previous episode of trichomonas that she is had in the past.  Denies any vaginal itching or irritation, dysuria, abdominal pain, fever.  Has done well with metronidazole before.  Denies any possibility of being pregnant, states she has had vaginal bleeding every single day for the past 2 months, currently working with OB/GYN on getting a hysterectomy.  Denies any known exposures to other STDs.   ROS: per HPI  Pertinent PMHx: Hypertension, menorrhagia, bipolar disorder, blind left eye, obesity   Exam:  Respiratory: Unlabored breathing, speaking full sentences  Assessment/Plan:  Trichomonas vaginalis infection Recent exposure to trichomonas with symptoms suggestive of this. Will go ahead and send in metronidazole 500 mg BID x 7 days, partner has already been treated.  Discussed avoidance of any sexual activity for one week after completing therapy to avoid reinfection.  Recommended follow-up in the clinic if her symptoms persist despite treatment for full STD evaluation.     Time spent during visit with patient: 8 minutes  Patriciaann Clan, DO  Family Medicine PGY-2

## 2019-02-11 NOTE — Assessment & Plan Note (Signed)
Recent exposure to trichomonas with symptoms suggestive of this. Will go ahead and send in metronidazole 500 mg BID x 7 days, partner has already been treated.  Discussed avoidance of any sexual activity for one week after completing therapy to avoid reinfection.  Recommended follow-up in the clinic if her symptoms persist despite treatment for full STD evaluation.

## 2019-02-12 ENCOUNTER — Other Ambulatory Visit: Payer: Self-pay | Admitting: Family Medicine

## 2019-02-12 NOTE — Telephone Encounter (Signed)
Per Dr Kennon Rounds, patient has no-showed appointments and surgery--we will not be re-scheduling her for now. She may increase her Megace to tid to help with bleeding and have her f/u in the office.    Called patient, no answer- left message for her to call us back.

## 2019-02-13 NOTE — Telephone Encounter (Signed)
Called patient and discussed increasing megace to TID and to follow up in office as currently scheduled on 3/3. Patient verbalized understanding & had no questions.

## 2019-02-17 ENCOUNTER — Telehealth: Payer: Self-pay | Admitting: Family Medicine

## 2019-02-17 DIAGNOSIS — D250 Submucous leiomyoma of uterus: Secondary | ICD-10-CM

## 2019-02-17 DIAGNOSIS — N920 Excessive and frequent menstruation with regular cycle: Secondary | ICD-10-CM

## 2019-02-17 MED ORDER — MEGESTROL ACETATE 40 MG PO TABS: 40.0000 mg | ORAL_TABLET | Freq: Three times a day (TID) | ORAL | 0 refills | Status: DC

## 2019-02-17 NOTE — Telephone Encounter (Signed)
Called patient stating I am returning her phone call. Patient states she is calling because she has been bleeding for 60 days and the Megace isn't working. Patient states she is currently clean and couldn't have her hysterectomy in the past because she was on drugs and in the ER one other time but she is clean now and would like to have it done. Reviewed last Thursday conversation with patient in which I told her Dr Kennon Rounds recommended she go up on her Megace to TID and follow up in the office as currently scheduled on 3/3. Patient verbalized understanding and states she doesn't remember that conversation. Patient asked for new Megace Rx because she will run out sooner. New Rx placed and patient informed. Patient verbalized understanding & had no questions.

## 2019-02-17 NOTE — Addendum Note (Signed)
Addended by: Shelly Coss on: 02/17/2019 04:31 PM   Modules accepted: Orders

## 2019-02-17 NOTE — Telephone Encounter (Signed)
Patient called in stating that she is returning a phone call that she received from the office on Thursday. Morey Hummingbird was not available at the time so the patient was informed that I would send her a message and she will be contacted as soon as Morey Hummingbird could. Patients verbalized understanding and message sent to carrie.

## 2019-02-19 ENCOUNTER — Other Ambulatory Visit: Payer: Self-pay

## 2019-02-19 ENCOUNTER — Encounter (HOSPITAL_COMMUNITY): Payer: Self-pay | Admitting: Obstetrics and Gynecology

## 2019-02-19 ENCOUNTER — Emergency Department (HOSPITAL_COMMUNITY)
Admission: EM | Admit: 2019-02-19 | Discharge: 2019-02-20 | Disposition: A | Discharge status: Home / Self Care | Payer: Medicaid Other | Attending: Emergency Medicine | Admitting: Emergency Medicine

## 2019-02-19 DIAGNOSIS — R1013 Epigastric pain: Secondary | ICD-10-CM | POA: Insufficient documentation

## 2019-02-19 DIAGNOSIS — T50905A Adverse effect of unspecified drugs, medicaments and biological substances, initial encounter: Secondary | ICD-10-CM | POA: Diagnosis not present

## 2019-02-19 DIAGNOSIS — I10 Essential (primary) hypertension: Secondary | ICD-10-CM | POA: Diagnosis not present

## 2019-02-19 DIAGNOSIS — J45909 Unspecified asthma, uncomplicated: Secondary | ICD-10-CM | POA: Insufficient documentation

## 2019-02-19 DIAGNOSIS — R109 Unspecified abdominal pain: Secondary | ICD-10-CM | POA: Diagnosis present

## 2019-02-19 DIAGNOSIS — Z79899 Other long term (current) drug therapy: Secondary | ICD-10-CM | POA: Diagnosis not present

## 2019-02-19 DIAGNOSIS — R112 Nausea with vomiting, unspecified: Secondary | ICD-10-CM | POA: Diagnosis not present

## 2019-02-19 DIAGNOSIS — Z87891 Personal history of nicotine dependence: Secondary | ICD-10-CM | POA: Insufficient documentation

## 2019-02-19 LAB — COMPREHENSIVE METABOLIC PANEL
ALT: 20 U/L (ref 0–44)
AST: 25 U/L (ref 15–41)
Albumin: 4.3 g/dL (ref 3.5–5.0)
Alkaline Phosphatase: 72 U/L (ref 38–126)
Anion gap: 15 (ref 5–15)
BUN: 12 mg/dL (ref 6–20)
CO2: 24 mmol/L (ref 22–32)
Calcium: 9.5 mg/dL (ref 8.9–10.3)
Chloride: 102 mmol/L (ref 98–111)
Creatinine, Ser: 0.96 mg/dL (ref 0.44–1.00)
GFR calc Af Amer: >60 mL/min (ref >60)
GFR calc non Af Amer: >60 mL/min (ref >60)
Glucose, Bld: 126 mg/dL — ABNORMAL HIGH (ref 70–99)
Potassium: 3.2 mmol/L — ABNORMAL LOW (ref 3.5–5.1)
Sodium: 141 mmol/L (ref 135–145)
Total Bilirubin: 0.7 mg/dL (ref 0.3–1.2)
Total Protein: 8.6 g/dL — ABNORMAL HIGH (ref 6.5–8.1)

## 2019-02-19 LAB — CBC WITH DIFFERENTIAL/PLATELET
Abs Immature Granulocytes: 0.07 10*3/uL (ref 0.00–0.07)
Basophils Absolute: 0 10*3/uL (ref 0.0–0.1)
Basophils Relative: 0 %
Eosinophils Absolute: 0 10*3/uL (ref 0.0–0.5)
Eosinophils Relative: 0 %
HCT: 40.1 % (ref 36.0–46.0)
Hemoglobin: 13.5 g/dL (ref 12.0–15.0)
Immature Granulocytes: 1 %
Lymphocytes Relative: 8 %
Lymphs Abs: 1.1 10*3/uL (ref 0.7–4.0)
MCH: 27.8 pg (ref 26.0–34.0)
MCHC: 33.7 g/dL (ref 30.0–36.0)
MCV: 82.5 fL (ref 80.0–100.0)
Monocytes Absolute: 0.5 10*3/uL (ref 0.1–1.0)
Monocytes Relative: 4 %
Neutro Abs: 11.7 10*3/uL — ABNORMAL HIGH (ref 1.7–7.7)
Neutrophils Relative %: 87 %
Platelets: 396 10*3/uL (ref 150–400)
RBC: 4.86 MIL/uL (ref 3.87–5.11)
RDW: 15.9 % — ABNORMAL HIGH (ref 11.5–15.5)
WBC: 13.3 10*3/uL — ABNORMAL HIGH (ref 4.0–10.5)
nRBC: 0 % (ref 0.0–0.2)

## 2019-02-19 LAB — LIPASE, BLOOD: Lipase: 19 U/L (ref 11–51)

## 2019-02-19 MED ORDER — SODIUM CHLORIDE 0.9 % IV BOLUS
1000.0000 mL | Freq: Once | INTRAVENOUS | Status: AC
  Administered 2019-02-19: 1000 mL via INTRAVENOUS

## 2019-02-19 MED ORDER — MORPHINE SULFATE (PF) 4 MG/ML IV SOLN
4.0000 mg | Freq: Once | INTRAVENOUS | Status: AC
  Administered 2019-02-19: 4 mg via INTRAVENOUS
  Filled 2019-02-19: qty 1

## 2019-02-19 MED ORDER — LORAZEPAM 2 MG/ML IJ SOLN
1.0000 mg | Freq: Once | INTRAMUSCULAR | Status: AC
  Administered 2019-02-19: 1 mg via INTRAVENOUS
  Filled 2019-02-19: qty 1

## 2019-02-19 MED ORDER — PANTOPRAZOLE SODIUM 40 MG IV SOLR
40.0000 mg | Freq: Once | INTRAVENOUS | Status: AC
  Administered 2019-02-19: 40 mg via INTRAVENOUS
  Filled 2019-02-19: qty 40

## 2019-02-19 MED ORDER — LIDOCAINE VISCOUS HCL 2 % MT SOLN
15.0000 mL | Freq: Once | OROMUCOSAL | Status: AC
  Administered 2019-02-19: 15 mL via OROMUCOSAL
  Filled 2019-02-19: qty 15

## 2019-02-19 MED ORDER — PROMETHAZINE HCL 25 MG/ML IJ SOLN
25.0000 mg | Freq: Once | INTRAMUSCULAR | Status: AC
  Administered 2019-02-19: 25 mg via INTRAVENOUS
  Filled 2019-02-19: qty 1

## 2019-02-19 MED ORDER — POTASSIUM CHLORIDE ER 10 MEQ PO TBCR: 10.0000 meq | EXTENDED_RELEASE_TABLET | Freq: Every day | ORAL | 0 refills | Status: DC

## 2019-02-19 NOTE — Discharge Instructions (Signed)
You have been seen today for vomiting. Please read and follow all provided instructions. Return to the emergency room for worsening condition or new concerning symptoms.    Suspect your vomiting today was caused by alcohol mixing with metronidazole.  As we discussed you should not drink alcohol while taking this medication because one of the side effects is vomiting.  1. Medications:  Prescription to your pharmacy for potassium replacement.  Please take this as prescribed.  After finishing taking this medicine you should have your potassium level rechecked by your primary care doctor.  Continue usual home medications Take medications as prescribed. Please review all of the medicines and only take them if you do not have an allergy to them.   2. Treatment: rest, drink plenty of fluids  3. Follow Up:  Please follow up with primary care provider by scheduling an appointment as soon as possible for a visit     It is also a possibility that you have an allergic reaction to any of the medicines that you have been prescribed - Everybody reacts differently to medications and while MOST people have no trouble with most medicines, you may have a reaction such as nausea, vomiting, rash, swelling, shortness of breath. If this is the case, please stop taking the medicine immediately and contact your physician.  ?

## 2019-02-19 NOTE — ED Triage Notes (Signed)
Patient reports to the ED with complaint of N/V and abdominal pain. Patient reports it started at 0500 today. Patient has a hx of pancreatitis and has been seen for the same in the past.

## 2019-02-19 NOTE — ED Notes (Signed)
Patient reports she is still taking Flagyl and last night drank alcohol which made her sick as well as potentially causing a flare up in her pancreatitis.

## 2019-02-19 NOTE — ED Notes (Signed)
Pt continually calling out via call light.  Upon entering room to see what pt needed, she told me she needed her nurse with the medicine.  

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
## 2019-02-20 MED ORDER — MORPHINE SULFATE (PF) 2 MG/ML IV SOLN
2.0000 mg | Freq: Once | INTRAVENOUS | Status: AC
  Administered 2019-02-20: 2 mg via INTRAVENOUS
  Filled 2019-02-20: qty 1

## 2019-02-22 ENCOUNTER — Emergency Department (HOSPITAL_COMMUNITY)
Admission: EM | Admit: 2019-02-22 | Discharge: 2019-02-22 | Disposition: A | Discharge status: Home / Self Care | Payer: Medicaid Other | Attending: Emergency Medicine | Admitting: Emergency Medicine

## 2019-02-22 ENCOUNTER — Encounter (HOSPITAL_COMMUNITY): Payer: Self-pay | Admitting: Emergency Medicine

## 2019-02-22 ENCOUNTER — Other Ambulatory Visit: Payer: Self-pay

## 2019-02-22 ENCOUNTER — Emergency Department (HOSPITAL_COMMUNITY): Payer: Medicaid Other

## 2019-02-22 DIAGNOSIS — R1110 Vomiting, unspecified: Secondary | ICD-10-CM | POA: Insufficient documentation

## 2019-02-22 DIAGNOSIS — I10 Essential (primary) hypertension: Secondary | ICD-10-CM | POA: Insufficient documentation

## 2019-02-22 DIAGNOSIS — F1010 Alcohol abuse, uncomplicated: Secondary | ICD-10-CM | POA: Insufficient documentation

## 2019-02-22 DIAGNOSIS — K5900 Constipation, unspecified: Secondary | ICD-10-CM | POA: Insufficient documentation

## 2019-02-22 DIAGNOSIS — Z87891 Personal history of nicotine dependence: Secondary | ICD-10-CM | POA: Insufficient documentation

## 2019-02-22 DIAGNOSIS — Z79899 Other long term (current) drug therapy: Secondary | ICD-10-CM | POA: Diagnosis not present

## 2019-02-22 DIAGNOSIS — G8929 Other chronic pain: Secondary | ICD-10-CM | POA: Diagnosis not present

## 2019-02-22 DIAGNOSIS — R109 Unspecified abdominal pain: Secondary | ICD-10-CM | POA: Diagnosis present

## 2019-02-22 DIAGNOSIS — J45909 Unspecified asthma, uncomplicated: Secondary | ICD-10-CM | POA: Insufficient documentation

## 2019-02-22 LAB — I-STAT CHEM 8, ED
BUN: 8 mg/dL (ref 6–20)
Calcium, Ion: 1.13 mmol/L — ABNORMAL LOW (ref 1.15–1.40)
Chloride: 98 mmol/L (ref 98–111)
Creatinine, Ser: 0.9 mg/dL (ref 0.44–1.00)
Glucose, Bld: 98 mg/dL (ref 70–99)
HCT: 40 % (ref 36.0–46.0)
Hemoglobin: 13.6 g/dL (ref 12.0–15.0)
Potassium: 4 mmol/L (ref 3.5–5.1)
Sodium: 139 mmol/L (ref 135–145)
TCO2: 32 mmol/L (ref 22–32)

## 2019-02-22 LAB — I-STAT BETA HCG BLOOD, ED (MC, WL, AP ONLY): I-stat hCG, quantitative: <5 m[IU]/mL (ref <5)

## 2019-02-22 LAB — HEPATIC FUNCTION PANEL
ALT: 27 U/L (ref 0–44)
AST: 42 U/L — ABNORMAL HIGH (ref 15–41)
Albumin: 4.3 g/dL (ref 3.5–5.0)
Alkaline Phosphatase: 67 U/L (ref 38–126)
Bilirubin, Direct: 0.3 mg/dL — ABNORMAL HIGH (ref 0.0–0.2)
Indirect Bilirubin: 0.8 mg/dL (ref 0.3–0.9)
Total Bilirubin: 1.1 mg/dL (ref 0.3–1.2)
Total Protein: 8 g/dL (ref 6.5–8.1)

## 2019-02-22 LAB — CBC WITH DIFFERENTIAL/PLATELET
Abs Immature Granulocytes: 0.03 10*3/uL (ref 0.00–0.07)
Basophils Absolute: 0 10*3/uL (ref 0.0–0.1)
Basophils Relative: 0 %
Eosinophils Absolute: 0 10*3/uL (ref 0.0–0.5)
Eosinophils Relative: 0 %
HCT: 37.9 % (ref 36.0–46.0)
Hemoglobin: 12.7 g/dL (ref 12.0–15.0)
Immature Granulocytes: 0 %
Lymphocytes Relative: 32 %
Lymphs Abs: 2.6 10*3/uL (ref 0.7–4.0)
MCH: 28 pg (ref 26.0–34.0)
MCHC: 33.5 g/dL (ref 30.0–36.0)
MCV: 83.7 fL (ref 80.0–100.0)
Monocytes Absolute: 0.7 10*3/uL (ref 0.1–1.0)
Monocytes Relative: 9 %
Neutro Abs: 4.8 10*3/uL (ref 1.7–7.7)
Neutrophils Relative %: 59 %
Platelets: 353 10*3/uL (ref 150–400)
RBC: 4.53 MIL/uL (ref 3.87–5.11)
RDW: 15.7 % — ABNORMAL HIGH (ref 11.5–15.5)
WBC: 8.2 10*3/uL (ref 4.0–10.5)
nRBC: 0 % (ref 0.0–0.2)

## 2019-02-22 LAB — LIPASE, BLOOD: Lipase: 18 U/L (ref 11–51)

## 2019-02-22 MED ORDER — LIDOCAINE VISCOUS HCL 2 % MT SOLN
15.0000 mL | Freq: Once | OROMUCOSAL | Status: DC
  Filled 2019-02-22: qty 15

## 2019-02-22 MED ORDER — ALUM & MAG HYDROXIDE-SIMETH 200-200-20 MG/5ML PO SUSP
30.0000 mL | Freq: Once | ORAL | Status: AC
  Administered 2019-02-22: 30 mL via ORAL
  Filled 2019-02-22: qty 30

## 2019-02-22 MED ORDER — CAPSAICIN 0.025 % EX CREA
TOPICAL_CREAM | Freq: Two times a day (BID) | CUTANEOUS | Status: DC
  Filled 2019-02-22: qty 60

## 2019-02-22 MED ORDER — HALOPERIDOL LACTATE 5 MG/ML IJ SOLN
2.0000 mg | Freq: Once | INTRAMUSCULAR | Status: AC
  Administered 2019-02-22: 2 mg via INTRAVENOUS
  Filled 2019-02-22: qty 1

## 2019-02-22 MED ORDER — METRONIDAZOLE 500 MG PO TABS
2000.0000 mg | ORAL_TABLET | Freq: Once | ORAL | Status: AC
  Administered 2019-02-22: 2000 mg via ORAL
  Filled 2019-02-22: qty 4

## 2019-02-22 MED ORDER — ONDANSETRON 4 MG PO TBDP: ORAL_TABLET | ORAL | 0 refills | Status: DC

## 2019-02-22 MED ORDER — DICYCLOMINE HCL 10 MG/ML IM SOLN
20.0000 mg | Freq: Once | INTRAMUSCULAR | Status: AC
  Administered 2019-02-22: 20 mg via INTRAMUSCULAR
  Filled 2019-02-22: qty 2

## 2019-02-22 MED ORDER — HALOPERIDOL LACTATE 5 MG/ML IJ SOLN
5.0000 mg | Freq: Once | INTRAMUSCULAR | Status: AC
  Administered 2019-02-22: 5 mg via INTRAVENOUS
  Filled 2019-02-22: qty 1

## 2019-02-22 MED ORDER — FAMOTIDINE IN NACL 20-0.9 MG/50ML-% IV SOLN
20.0000 mg | Freq: Once | INTRAVENOUS | Status: AC
  Administered 2019-02-22: 20 mg via INTRAVENOUS
  Filled 2019-02-22: qty 50

## 2019-02-22 MED ORDER — ALUM & MAG HYDROXIDE-SIMETH 200-200-20 MG/5ML PO SUSP
30.0000 mL | Freq: Once | ORAL | Status: DC
  Filled 2019-02-22: qty 30

## 2019-02-22 NOTE — ED Triage Notes (Signed)
Pt presents to ED via [facility] from home c/o N/V and abdominal pain x3 days. Pt was seen 3 days ago here and discharge with same complaint. EMS gave 150 ml Fluid, 4 mg zofran and 50 mcg of fentanyl en route.
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
## 2019-02-22 NOTE — Discharge Instructions (Signed)
Stop using alcohol, take all your flagyl.

## 2019-02-26 ENCOUNTER — Encounter (HOSPITAL_COMMUNITY): Payer: Self-pay

## 2019-02-26 ENCOUNTER — Emergency Department (HOSPITAL_COMMUNITY): Payer: Medicaid Other

## 2019-02-26 ENCOUNTER — Observation Stay (HOSPITAL_COMMUNITY)
Admission: EM | Admit: 2019-02-26 | Discharge: 2019-02-27 | Disposition: A | Discharge status: Home / Self Care | Payer: Medicaid Other | Attending: Internal Medicine | Admitting: Internal Medicine

## 2019-02-26 ENCOUNTER — Other Ambulatory Visit: Payer: Self-pay

## 2019-02-26 DIAGNOSIS — Z79899 Other long term (current) drug therapy: Secondary | ICD-10-CM | POA: Insufficient documentation

## 2019-02-26 DIAGNOSIS — K861 Other chronic pancreatitis: Secondary | ICD-10-CM | POA: Diagnosis not present

## 2019-02-26 DIAGNOSIS — F1410 Cocaine abuse, uncomplicated: Secondary | ICD-10-CM | POA: Insufficient documentation

## 2019-02-26 DIAGNOSIS — R7303 Prediabetes: Secondary | ICD-10-CM | POA: Diagnosis not present

## 2019-02-26 DIAGNOSIS — Z87891 Personal history of nicotine dependence: Secondary | ICD-10-CM | POA: Diagnosis not present

## 2019-02-26 DIAGNOSIS — K219 Gastro-esophageal reflux disease without esophagitis: Secondary | ICD-10-CM | POA: Diagnosis not present

## 2019-02-26 DIAGNOSIS — Z888 Allergy status to other drugs, medicaments and biological substances status: Secondary | ICD-10-CM | POA: Diagnosis not present

## 2019-02-26 DIAGNOSIS — Z20822 Contact with and (suspected) exposure to covid-19: Secondary | ICD-10-CM | POA: Diagnosis not present

## 2019-02-26 DIAGNOSIS — E7800 Pure hypercholesterolemia, unspecified: Secondary | ICD-10-CM | POA: Diagnosis not present

## 2019-02-26 DIAGNOSIS — Z881 Allergy status to other antibiotic agents status: Secondary | ICD-10-CM | POA: Diagnosis not present

## 2019-02-26 DIAGNOSIS — I10 Essential (primary) hypertension: Secondary | ICD-10-CM | POA: Insufficient documentation

## 2019-02-26 DIAGNOSIS — J45909 Unspecified asthma, uncomplicated: Secondary | ICD-10-CM | POA: Insufficient documentation

## 2019-02-26 DIAGNOSIS — R112 Nausea with vomiting, unspecified: Principal | ICD-10-CM | POA: Diagnosis present

## 2019-02-26 DIAGNOSIS — R1084 Generalized abdominal pain: Secondary | ICD-10-CM | POA: Diagnosis not present

## 2019-02-26 LAB — CBC WITH DIFFERENTIAL/PLATELET
Abs Immature Granulocytes: 0.03 10*3/uL (ref 0.00–0.07)
Basophils Absolute: 0 10*3/uL (ref 0.0–0.1)
Basophils Relative: 0 %
Eosinophils Absolute: 0 10*3/uL (ref 0.0–0.5)
Eosinophils Relative: 0 %
HCT: 37.5 % (ref 36.0–46.0)
Hemoglobin: 12.6 g/dL (ref 12.0–15.0)
Immature Granulocytes: 0 %
Lymphocytes Relative: 14 %
Lymphs Abs: 1.2 10*3/uL (ref 0.7–4.0)
MCH: 28.1 pg (ref 26.0–34.0)
MCHC: 33.6 g/dL (ref 30.0–36.0)
MCV: 83.7 fL (ref 80.0–100.0)
Monocytes Absolute: 0.4 10*3/uL (ref 0.1–1.0)
Monocytes Relative: 4 %
Neutro Abs: 7 10*3/uL (ref 1.7–7.7)
Neutrophils Relative %: 82 %
Platelets: 356 10*3/uL (ref 150–400)
RBC: 4.48 MIL/uL (ref 3.87–5.11)
RDW: 15.6 % — ABNORMAL HIGH (ref 11.5–15.5)
WBC: 8.6 10*3/uL (ref 4.0–10.5)
nRBC: 0 % (ref 0.0–0.2)

## 2019-02-26 LAB — URINALYSIS, ROUTINE W REFLEX MICROSCOPIC
Bilirubin Urine: NEGATIVE
Glucose, UA: 50 mg/dL — AB
Hgb urine dipstick: NEGATIVE
Ketones, ur: 20 mg/dL — AB
Leukocytes,Ua: NEGATIVE
Nitrite: NEGATIVE
Protein, ur: NEGATIVE mg/dL
Specific Gravity, Urine: 1.033 — ABNORMAL HIGH (ref 1.005–1.030)
pH: 8 (ref 5.0–8.0)

## 2019-02-26 LAB — COMPREHENSIVE METABOLIC PANEL
ALT: 21 U/L (ref 0–44)
AST: 22 U/L (ref 15–41)
Albumin: 4.3 g/dL (ref 3.5–5.0)
Alkaline Phosphatase: 68 U/L (ref 38–126)
Anion gap: 11 (ref 5–15)
BUN: 10 mg/dL (ref 6–20)
CO2: 27 mmol/L (ref 22–32)
Calcium: 9.3 mg/dL (ref 8.9–10.3)
Chloride: 101 mmol/L (ref 98–111)
Creatinine, Ser: 0.88 mg/dL (ref 0.44–1.00)
GFR calc Af Amer: >60 mL/min (ref >60)
GFR calc non Af Amer: >60 mL/min (ref >60)
Glucose, Bld: 138 mg/dL — ABNORMAL HIGH (ref 70–99)
Potassium: 3.3 mmol/L — ABNORMAL LOW (ref 3.5–5.1)
Sodium: 139 mmol/L (ref 135–145)
Total Bilirubin: 0.6 mg/dL (ref 0.3–1.2)
Total Protein: 8.2 g/dL — ABNORMAL HIGH (ref 6.5–8.1)

## 2019-02-26 LAB — RAPID URINE DRUG SCREEN, HOSP PERFORMED
Amphetamines: NOT DETECTED
Barbiturates: NOT DETECTED
Benzodiazepines: NOT DETECTED
Cocaine: POSITIVE — AB
Opiates: NOT DETECTED
Tetrahydrocannabinol: NOT DETECTED

## 2019-02-26 LAB — SARS CORONAVIRUS 2 (TAT 6-24 HRS): SARS Coronavirus 2: NEGATIVE

## 2019-02-26 LAB — I-STAT BETA HCG BLOOD, ED (MC, WL, AP ONLY): I-stat hCG, quantitative: <5 m[IU]/mL (ref <5)

## 2019-02-26 LAB — LIPASE, BLOOD: Lipase: 58 U/L — ABNORMAL HIGH (ref 11–51)

## 2019-02-26 LAB — ETHANOL: Alcohol, Ethyl (B): <10 mg/dL (ref <10)

## 2019-02-26 MED ORDER — PROMETHAZINE HCL 25 MG/ML IJ SOLN
INTRAMUSCULAR | Status: AC
  Filled 2019-02-26: qty 1

## 2019-02-26 MED ORDER — MORPHINE SULFATE (PF) 2 MG/ML IV SOLN
2.0000 mg | INTRAVENOUS | Status: DC | PRN
  Administered 2019-02-26 – 2019-02-27 (×4): 4 mg via INTRAVENOUS
  Filled 2019-02-26 (×4): qty 2

## 2019-02-26 MED ORDER — PROMETHAZINE HCL 25 MG/ML IJ SOLN
12.5000 mg | Freq: Four times a day (QID) | INTRAMUSCULAR | Status: DC | PRN
  Administered 2019-02-26 – 2019-02-27 (×2): 12.5 mg via INTRAVENOUS
  Filled 2019-02-26 (×2): qty 1

## 2019-02-26 MED ORDER — SODIUM CHLORIDE 0.9 % IV SOLN: INTRAVENOUS | Status: DC

## 2019-02-26 MED ORDER — DIPHENHYDRAMINE HCL 50 MG/ML IJ SOLN
25.0000 mg | Freq: Once | INTRAMUSCULAR | Status: AC
  Administered 2019-02-26: 25 mg via INTRAVENOUS
  Filled 2019-02-26: qty 1

## 2019-02-26 MED ORDER — THIAMINE HCL 100 MG PO TABS
100.0000 mg | ORAL_TABLET | Freq: Every day | ORAL | Status: DC
  Administered 2019-02-27: 100 mg via ORAL
  Filled 2019-02-26: qty 1

## 2019-02-26 MED ORDER — LORAZEPAM 1 MG PO TABS: 1.0000 mg | ORAL_TABLET | ORAL | Status: DC | PRN

## 2019-02-26 MED ORDER — LORAZEPAM 2 MG/ML IJ SOLN: 0.0000 mg | Freq: Two times a day (BID) | INTRAMUSCULAR | Status: DC

## 2019-02-26 MED ORDER — PROMETHAZINE HCL 25 MG/ML IJ SOLN
12.5000 mg | Freq: Once | INTRAMUSCULAR | Status: AC
  Administered 2019-02-26: 09:00:00 12.5 mg via INTRAVENOUS
  Filled 2019-02-26: qty 1

## 2019-02-26 MED ORDER — ADULT MULTIVITAMIN W/MINERALS CH
1.0000 | ORAL_TABLET | Freq: Every day | ORAL | Status: DC
  Administered 2019-02-26 – 2019-02-27 (×2): 1 via ORAL
  Filled 2019-02-26 (×2): qty 1

## 2019-02-26 MED ORDER — ONDANSETRON HCL 4 MG/2ML IJ SOLN
4.0000 mg | Freq: Four times a day (QID) | INTRAMUSCULAR | Status: DC | PRN
  Administered 2019-02-26: 15:00:00 4 mg via INTRAVENOUS
  Filled 2019-02-26: qty 2

## 2019-02-26 MED ORDER — METOCLOPRAMIDE HCL 5 MG/ML IJ SOLN
10.0000 mg | Freq: Four times a day (QID) | INTRAMUSCULAR | Status: DC
  Administered 2019-02-26 – 2019-02-27 (×3): 10 mg via INTRAVENOUS
  Filled 2019-02-26 (×3): qty 2

## 2019-02-26 MED ORDER — FOLIC ACID 1 MG PO TABS
1.0000 mg | ORAL_TABLET | Freq: Every day | ORAL | Status: DC
  Administered 2019-02-26 – 2019-02-27 (×2): 1 mg via ORAL
  Filled 2019-02-26 (×2): qty 1

## 2019-02-26 MED ORDER — SODIUM CHLORIDE 0.9 % IV BOLUS
1000.0000 mL | Freq: Once | INTRAVENOUS | Status: AC
  Administered 2019-02-26: 09:00:00 1000 mL via INTRAVENOUS

## 2019-02-26 MED ORDER — HYDRALAZINE HCL 20 MG/ML IJ SOLN
10.0000 mg | Freq: Four times a day (QID) | INTRAMUSCULAR | Status: DC | PRN
  Administered 2019-02-26: 10 mg via INTRAVENOUS
  Filled 2019-02-26: qty 1

## 2019-02-26 MED ORDER — THIAMINE HCL 100 MG/ML IJ SOLN
100.0000 mg | Freq: Every day | INTRAMUSCULAR | Status: DC
  Administered 2019-02-26: 100 mg via INTRAVENOUS
  Filled 2019-02-26: qty 2

## 2019-02-26 MED ORDER — LORAZEPAM 2 MG/ML IJ SOLN: 0.0000 mg | Freq: Four times a day (QID) | INTRAMUSCULAR | Status: DC

## 2019-02-26 MED ORDER — IOHEXOL 300 MG/ML  SOLN
100.0000 mL | Freq: Once | INTRAMUSCULAR | Status: AC | PRN
  Administered 2019-02-26: 11:00:00 100 mL via INTRAVENOUS

## 2019-02-26 MED ORDER — ACETAMINOPHEN 650 MG RE SUPP: 650.0000 mg | Freq: Four times a day (QID) | RECTAL | Status: DC | PRN

## 2019-02-26 MED ORDER — MORPHINE SULFATE (PF) 4 MG/ML IV SOLN
4.0000 mg | Freq: Once | INTRAVENOUS | Status: AC
  Administered 2019-02-26: 15:00:00 4 mg via INTRAVENOUS
  Filled 2019-02-26: qty 1

## 2019-02-26 MED ORDER — ACETAMINOPHEN 325 MG PO TABS: 650.0000 mg | ORAL_TABLET | Freq: Four times a day (QID) | ORAL | Status: DC | PRN

## 2019-02-26 MED ORDER — ENOXAPARIN SODIUM 40 MG/0.4ML ~~LOC~~ SOLN
40.0000 mg | SUBCUTANEOUS | Status: DC
  Administered 2019-02-26: 17:00:00 40 mg via SUBCUTANEOUS
  Filled 2019-02-26: qty 0.4

## 2019-02-26 MED ORDER — POTASSIUM CHLORIDE 10 MEQ/100ML IV SOLN
10.0000 meq | INTRAVENOUS | Status: AC
  Administered 2019-02-26 (×4): 10 meq via INTRAVENOUS
  Filled 2019-02-26 (×4): qty 100

## 2019-02-26 MED ORDER — PROMETHAZINE HCL 25 MG/ML IJ SOLN
12.5000 mg | Freq: Once | INTRAMUSCULAR | Status: AC
  Administered 2019-02-26: 11:00:00 12.5 mg via INTRAVENOUS

## 2019-02-26 MED ORDER — CLONIDINE HCL 0.1 MG/24HR TD PTWK
0.1000 mg | MEDICATED_PATCH | TRANSDERMAL | Status: DC
  Administered 2019-02-26: 0.1 mg via TRANSDERMAL
  Filled 2019-02-26: qty 1

## 2019-02-26 MED ORDER — HALOPERIDOL LACTATE 5 MG/ML IJ SOLN
2.0000 mg | Freq: Once | INTRAMUSCULAR | Status: AC
  Administered 2019-02-26: 09:00:00 2 mg via INTRAVENOUS
  Filled 2019-02-26: qty 1

## 2019-02-26 MED ORDER — PANTOPRAZOLE SODIUM 40 MG IV SOLR
40.0000 mg | Freq: Every day | INTRAVENOUS | Status: DC
  Administered 2019-02-26: 15:00:00 40 mg via INTRAVENOUS
  Filled 2019-02-26: qty 40

## 2019-02-26 MED ORDER — SODIUM CHLORIDE (PF) 0.9 % IJ SOLN
INTRAMUSCULAR | Status: AC
  Filled 2019-02-26: qty 50

## 2019-02-26 MED ORDER — LORAZEPAM 2 MG/ML IJ SOLN
1.0000 mg | INTRAMUSCULAR | Status: DC | PRN
  Administered 2019-02-26: 16:00:00 2 mg via INTRAVENOUS
  Filled 2019-02-26: qty 1
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
## 2019-02-26 NOTE — ED Notes (Signed)
Patient has vomited an estimated 75-150 mL. Dr. Tomi Bamberger is aware and is preparing for admission.
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
## 2019-02-26 NOTE — ED Notes (Signed)
Dr. Tomi Bamberger would like patient to be able to drink fluids prior to discharge. Right now, patient is drowsy from the medication. Will allow patient to rest and wake up more before trying to give fluids.

## 2019-02-26 NOTE — ED Notes (Signed)
Pt still drowsy in room. When I asked pt how she was doing, she held up a bag of emesis and said "not good".

## 2019-02-26 NOTE — Progress Notes (Signed)
Received report from E. Lopez, [place] in ED.

## 2019-02-26 NOTE — ED Triage Notes (Signed)
Patient arrived by EMS from home. Patient c/o ABD pain and N/V since 0200 this morning per EMS.   EMS gave 4mg  of Zofran.   VS WDL per EMS.   History of pancreatitis.

## 2019-02-26 NOTE — Progress Notes (Addendum)
   Vital Signs MEWS/VS Documentation      02/26/2019 1400 02/26/2019 1500 02/26/2019 1543 02/26/2019 1726   MEWS Score:  1  3  1  2    MEWS Score Color:  Green  Yellow  Green  Yellow   Resp:  --  (!) 22  18  --   Pulse:  --  (!) 108  (!) 102  (!) 115   BP:  --  (!) 166/110  (!) 166/108  (!) 151/101   Temp:  98.4 F (36.9 C)  --  98.3 F (36.8 C)  --   O2 Device:  --  --  Room Air  --      Patient triggered yellow mews due to bp of 151/101 and pulse of 115. Dr. Cordelia Poche notified. Order placed for 10 mg Hydralazine PRN and given. Patient is not in distress, will continue to monitor.     Tarique Loveall A Hilliard Borges 02/26/2019,5:37 PM

## 2019-02-26 NOTE — ED Notes (Signed)
Spoke with Dr. Lonny Prude, he is going to place order for nausea and vomiting.

## 2019-02-26 NOTE — ED Notes (Signed)
Pt made aware of the need for a urine sample. °

## 2019-02-27 DIAGNOSIS — R112 Nausea with vomiting, unspecified: Secondary | ICD-10-CM | POA: Diagnosis not present

## 2019-02-27 LAB — COMPREHENSIVE METABOLIC PANEL
ALT: 18 U/L (ref 0–44)
AST: 18 U/L (ref 15–41)
Albumin: 4.3 g/dL (ref 3.5–5.0)
Alkaline Phosphatase: 62 U/L (ref 38–126)
Anion gap: 12 (ref 5–15)
BUN: 7 mg/dL (ref 6–20)
CO2: 27 mmol/L (ref 22–32)
Calcium: 9.3 mg/dL (ref 8.9–10.3)
Chloride: 102 mmol/L (ref 98–111)
Creatinine, Ser: 0.79 mg/dL (ref 0.44–1.00)
GFR calc Af Amer: >60 mL/min (ref >60)
GFR calc non Af Amer: >60 mL/min (ref >60)
Glucose, Bld: 111 mg/dL — ABNORMAL HIGH (ref 70–99)
Potassium: 3.3 mmol/L — ABNORMAL LOW (ref 3.5–5.1)
Sodium: 141 mmol/L (ref 135–145)
Total Bilirubin: 0.5 mg/dL (ref 0.3–1.2)
Total Protein: 7.9 g/dL (ref 6.5–8.1)

## 2019-02-27 LAB — CBC
HCT: 39.6 % (ref 36.0–46.0)
Hemoglobin: 13.1 g/dL (ref 12.0–15.0)
MCH: 28 pg (ref 26.0–34.0)
MCHC: 33.1 g/dL (ref 30.0–36.0)
MCV: 84.6 fL (ref 80.0–100.0)
Platelets: 351 10*3/uL (ref 150–400)
RBC: 4.68 MIL/uL (ref 3.87–5.11)
RDW: 16.2 % — ABNORMAL HIGH (ref 11.5–15.5)
WBC: 10.8 10*3/uL — ABNORMAL HIGH (ref 4.0–10.5)
nRBC: 0 % (ref 0.0–0.2)

## 2019-02-27 MED ORDER — POTASSIUM CHLORIDE ER 10 MEQ PO TBCR: 10.0000 meq | EXTENDED_RELEASE_TABLET | Freq: Every day | ORAL | 0 refills | Status: DC

## 2019-02-27 MED ORDER — ONDANSETRON 4 MG PO TBDP: ORAL_TABLET | ORAL | 0 refills | Status: DC

## 2019-02-27 NOTE — Discharge Summary (Signed)
Physician Discharge Summary  Holly Mendez QZR:[id number] DOB: 1975-02-18 DOA: 02/26/2019  PCP: Dickie La, MD  Admit date: 02/26/2019 Discharge date: 02/27/2019  Admitted From: Home  Discharge disposition: Home   Recommendations for Outpatient Follow-Up:   . Follow up with your primary care provider in one week.  . Check CBC, BMP in the next visit . Follow-up with your GI physician as outpatient.   Discharge Diagnosis:   Active Problems:   Intractable nausea and vomiting  Discharge Condition: Improved.  Diet recommendation: Low sodium,  Wound care: None.  Code status: Full.   History of Present Illness:   Holly Mendez is a 45 y.o. female with medical history significant of chronic abdominal pain, chronic pancreatitis, anxiety, depression, bipolar disorder, cocaine use. Patient reported 8 days of having nausea and vomiting and difficulty keeping anything down.  She also complained of constipation and generalized abdominal pain.  Patient was then placed in for observation in the hospital.  Hospital Course:   Following conditions were addressed during hospitalization as listed below,  Intractable nausea/vomiting Improved.  Multiple ED visits for the same.  CT scan of the abdomen is unremarkable.  Has chronic otitis and cocaine use.  Received IV fluids Zofran.  Patient feels improved and wishes to go home.  Chronic pancreatitis Counseled on alcohol cessation.  History of alcohol use No signs of withdrawal.    GERD On H2 blockers and PPI at home  Essential hypertension Patient is on hydrochlorothiazide and clonidine as an outpatient.  Will resume  Bipolar disorder Depression On Latuda, Elavil as outpatient.  Continue on discharge.   Cocaine abuse Chronic. UDS is currently positive.  Allergies On Zyrtec as an outpatient  Menorrhagia Patient is on Megace as an outpatient  Disposition.  At this time, patient is stable  for disposition home.  Was advised to follow-up with her primary care physician and GI as outpatient  Medical Consultants:    None.  Procedures:    None Subjective:   Today, patient feels better.  On clear liquids.  Wishes to go home.  She wants to follow-up with her GI as outpatient.  Denies overt pain.  Discharge Exam:   Vitals:   02/27/19 0114 02/27/19 0547  BP: 117/82 (!) 147/110  Pulse: (!) 103 (!) 107  Resp: 16 16  Temp: 98.5 F (36.9 C) (!) 97.5 F (36.4 C)  SpO2: 100% 100%   Vitals:   02/26/19 2039 02/26/19 2201 02/27/19 0114 02/27/19 0547  BP: 119/66 117/64 117/82 (!) 147/110  Pulse: (!) 121 (!) 118 (!) 103 (!) 107  Resp: '16 16 16 16  ' Temp: 99.7 F (37.6 C) 99.6 F (37.6 C) 98.5 F (36.9 C) (!) 97.5 F (36.4 C)  TempSrc: Oral Oral Oral Oral  SpO2: 100% 96% 100% 100%  Weight:      Height:       General: Alert awake, not in obvious distress HENT: pupils equally reacting to light,  No scleral pallor or icterus noted. Oral mucosa is moist.  Chest:  Clear breath sounds.  Diminished breath sounds bilaterally. No crackles or wheezes.  CVS: S1 &S2 heard.  Regular rate and rhythm. Abdomen: Soft,  nondistended.  Bowel sounds are heard.   Extremities: No cyanosis, clubbing or edema.  Peripheral pulses are palpable. Psych: Alert, awake and oriented, normal mood CNS:  No cranial nerve deficits.  Power equal in all extremities.   Skin: Warm and dry.  No rashes noted.  The results of significant  diagnostics from this hospitalization (including imaging, microbiology, ancillary and laboratory) are listed below for reference.     Diagnostic Studies:   CT ABDOMEN PELVIS W CONTRAST  Result Date: 02/26/2019 CLINICAL DATA:  Abdominal pain, nausea, vomiting. EXAM: CT ABDOMEN AND PELVIS WITH CONTRAST TECHNIQUE: Multidetector CT imaging of the abdomen and pelvis was performed using the standard protocol following bolus administration of intravenous contrast. CONTRAST:   162m OMNIPAQUE IOHEXOL 300 MG/ML  SOLN COMPARISON:  10/28/2018 FINDINGS: Lower chest: Lung bases are clear. No effusions. Heart is normal size. Small hiatal hernia. Hepatobiliary: No focal hepatic abnormality. Gallbladder unremarkable. Pancreas: No focal abnormality or ductal dilatation. Spleen: No focal abnormality.  Normal size. Adrenals/Urinary Tract: No adrenal abnormality. No focal renal abnormality. No stones or hydronephrosis. Urinary bladder is unremarkable. Stomach/Bowel: Stomach, large and small bowel grossly unremarkable. Normal appendix. Vascular/Lymphatic: No evidence of aneurysm or adenopathy. Reproductive: Uterus appears enlarged, likely fibroid uterus as seen on prior study. No adnexal mass. Other: No free fluid or free air. Musculoskeletal: No acute bony abnormality. IMPRESSION: No acute findings in the abdomen or pelvis. Normal appendix. Small hiatal hernia. Electronically Signed   By: KRolm BaptiseM.D.   On: 02/26/2019 11:10   DG Abd Acute W/Chest  Result Date: 02/26/2019 CLINICAL DATA:  Abdominal pain with nausea and vomiting EXAM: DG ABDOMEN ACUTE W/ 1V CHEST COMPARISON:  Abdomen series February 22, 2019 FINDINGS: PA chest: Lungs are clear. Heart size and pulmonary vascularity are normal. No adenopathy. Supine and upright abdomen: There is moderate stool in the colon. There is no bowel dilatation or air-fluid level to suggest bowel obstruction. No free air. Small phleboliths noted in the right pelvis. IMPRESSION: Moderate stool in colon. No bowel obstruction or free air evident. Lungs clear. Electronically Signed   By: WLowella GripIII M.D.   On: 02/26/2019 08:49     Labs:   Basic Metabolic Panel: Recent Labs  Lab 02/22/19 0455 02/22/19 0455 02/26/19 0905 02/27/19 0345  NA 139  --  139 141  K 4.0   < > 3.3* 3.3*  CL 98  --  101 102  CO2  --   --  27 27  GLUCOSE 98  --  138* 111*  BUN 8  --  10 7  CREATININE 0.90  --  0.88 0.79  CALCIUM  --   --  9.3 9.3   < > =  values in this interval not displayed.   GFR Estimated Creatinine Clearance: 97.3 mL/min (by C-G formula based on SCr of 0.79 mg/dL). Liver Function Tests: Recent Labs  Lab 02/22/19 0425 02/26/19 0905 02/27/19 0345  AST 42* 22 18  ALT '27 21 18  ' ALKPHOS 67 68 62  BILITOT 1.1 0.6 0.5  PROT 8.0 8.2* 7.9  ALBUMIN 4.3 4.3 4.3   Recent Labs  Lab 02/22/19 0425 02/26/19 0905  LIPASE 18 58*   No results for input(s): AMMONIA in the last 168 hours. Coagulation profile No results for input(s): INR, PROTIME in the last 168 hours.  CBC: Recent Labs  Lab 02/22/19 0425 02/22/19 0455 02/26/19 0905 02/27/19 0345  WBC 8.2  --  8.6 10.8*  NEUTROABS 4.8  --  7.0  --   HGB 12.7 13.6 12.6 13.1  HCT 37.9 40.0 37.5 39.6  MCV 83.7  --  83.7 84.6  PLT 353  --  356 351   Cardiac Enzymes: No results for input(s): CKTOTAL, CKMB, CKMBINDEX, TROPONINI in the last 168 hours. BNP: Invalid input(s): POCBNP CBG:  No results for input(s): GLUCAP in the last 168 hours. D-Dimer No results for input(s): DDIMER in the last 72 hours. Hgb A1c No results for input(s): HGBA1C in the last 72 hours. Lipid Profile No results for input(s): CHOL, HDL, LDLCALC, TRIG, CHOLHDL, LDLDIRECT in the last 72 hours. Thyroid function studies No results for input(s): TSH, T4TOTAL, T3FREE, THYROIDAB in the last 72 hours.  Invalid input(s): FREET3 Anemia work up No results for input(s): VITAMINB12, FOLATE, FERRITIN, TIBC, IRON, RETICCTPCT in the last 72 hours. Microbiology Recent Results (from the past 240 hour(s))  SARS CORONAVIRUS 2 (TAT 6-24 HRS) Nasopharyngeal Nasopharyngeal Swab     Status: None   Collection Time: 02/26/19  2:01 PM   Specimen: Nasopharyngeal Swab  Result Value Ref Range Status   SARS Coronavirus 2 NEGATIVE NEGATIVE Final    Comment: (NOTE) SARS-CoV-2 target nucleic acids are NOT DETECTED. The SARS-CoV-2 RNA is generally detectable in upper and lower respiratory specimens during the acute  phase of infection. Negative results do not preclude SARS-CoV-2 infection, do not rule out co-infections with other pathogens, and should not be used as the sole basis for treatment or other patient management decisions. Negative results must be combined with clinical observations, patient history, and epidemiological information. The expected result is Negative. Fact Sheet for Patients: SugarRoll.be Fact Sheet for Healthcare Providers: https://www.woods-mathews.com/ This test is not yet approved or cleared by the [place] FDA and  has been authorized for detection and/or diagnosis of SARS-CoV-2 by FDA under an Emergency Use Authorization (EUA). This EUA will remain  in effect (meaning this test can be used) for the duration of the COVID-19 declaration under Section 56 4(b)(1) of the Act, 21 U.S.C. section 360bbb-3(b)(1), unless the authorization is terminated or revoked sooner. Performed at [facility], [place] [address], [place], [place] [address]      Discharge Instructions:   Discharge Instructions    Diet - low sodium heart healthy   Complete by: As directed    Discharge instructions   Complete by: As directed    Follow up with your GI physician in 1 week. Follow up with PCP in 1-2 weeks. Oral fluids and advance as tolerated.   Increase activity slowly   Complete by: As directed      Allergies as of 02/27/2019      Reactions   Lyrica [pregabalin] Swelling   Swelling of hands and feet   Naproxen Swelling   Norvasc [amlodipine Besylate] Other (See Comments)   Mouth irritation    Zithromax [azithromycin] Other (See Comments)   Severe stomach cramping      Medication List    TAKE these medications   albuterol 108 (90 Base) MCG/ACT inhaler Commonly known as: VENTOLIN HFA INHALE TWO PUFFS INTO THE LUNGS EVERY 6 HOURS AS NEEDED WHEEZING OR SHORTNESS OF BREATH What changed: See the new instructions.   amitriptyline 25  MG tablet Commonly known as: ELAVIL TAKE THREE TABLETS BY MOUTH AT BEDTIME   amitriptyline 50 MG tablet Commonly known as: ELAVIL Take 50 mg by mouth daily.   baclofen 10 MG tablet Commonly known as: LIORESAL Take 10 mg by mouth 3 (three) times daily as needed for muscle spasms.   budesonide-formoterol 80-4.5 MCG/ACT inhaler Commonly known as: Symbicort INHALE TWO PUFFS INTO THE LUNGS TWICE DAILY   Catapres-TTS-1 0.1 mg/24hr patch Generic drug: cloNIDine Place 1 patch (0.1 mg total) onto the skin once a week. What changed: See the new instructions.   cetirizine 10 MG tablet  Commonly known as: ZYRTEC Take 1 tablet (10 mg total) by mouth daily.   esomeprazole 40 MG capsule Commonly known as: NEXIUM Take 1 capsule (40 mg total) by mouth daily.   famotidine 20 MG tablet Commonly known as: PEPCID Take 1 tablet (20 mg total) by mouth 2 (two) times daily.   hydrochlorothiazide 25 MG tablet Commonly known as: HYDRODIURIL Take 1 tablet (25 mg total) by mouth daily.   HYDROcodone-acetaminophen 7.5-325 MG tablet Commonly known as: NORCO Take 1 tablet by mouth every 4 (four) hours as needed for moderate pain.   hydrOXYzine 25 MG tablet Commonly known as: ATARAX/VISTARIL Take 25 mg by mouth as needed.   Hyoscyamine Sulfate SL 0.125 MG Subl Commonly known as: Levsin/SL Place 0.125 mg under the tongue every 6 (six) hours as needed (pain).   Latuda 80 MG Tabs tablet Generic drug: lurasidone Take 80 mg by mouth daily.   loperamide 2 MG capsule Commonly known as: IMODIUM 34m once, then 288mafter each episode of diarrhea as needed. Max dose 1661mer day. What changed:   how much to take  how to take this  when to take this  reasons to take this  additional instructions   megestrol 40 MG tablet Commonly known as: MEGACE Take 1 tablet (40 mg total) by mouth 3 (three) times daily.   metoCLOPramide 10 MG tablet Commonly known as: REGLAN Take 1 tablet (10 mg total)  by mouth every 6 (six) hours.   Narcan 4 MG/0.1ML Liqd nasal spray kit Generic drug: naloxone Place 1 spray into the nose as needed (overdose).   ondansetron 4 MG disintegrating tablet Commonly known as: Zofran ODT 4mg41mT q8 hours prn nausea/vomit     polyethylene glycol powder 17 GM/SCOOP powder Commonly known as: GLYCOLAX/MIRALAX Take one or two packets daily by mouth as directed for constipation What changed: See the new instructions.   potassium chloride 10 MEQ tablet Commonly known as: KLOR-CON Take 1 tablet (10 mEq total) by mouth daily for 5 days.   Promethegan 25 MG suppository Generic drug: promethazine Place 1/2 suppository (12.5 mg total) rectally every 12 (twelve) hours as needed for nausea or vomiting. What changed: See the new instructions.   sucralfate 1 g tablet Commonly known as: Carafate Take 1 tablet (1 g total) by mouth 4 (four) times daily -  with meals and at bedtime.   Vitamin D (Ergocalciferol) 1.25 MG (50000 UNIT) Caps capsule Commonly known as: DRISDOL Take 50,000 Units by mouth 2 (two) times a week.         Time coordinating discharge: 39 minutes  Signed:  Tysheem Accardo  [facility] 02/27/2019, 10:00 AM

## 2019-02-27 NOTE — Progress Notes (Signed)
Patient discharged to home via Cab, discharge instructions reviewed with patient who verbalized understanding.

## 2019-03-03 ENCOUNTER — Emergency Department (HOSPITAL_COMMUNITY)
Admission: EM | Admit: 2019-03-03 | Discharge: 2019-03-03 | Disposition: A | Discharge status: Home / Self Care | Payer: Medicaid Other | Attending: Emergency Medicine | Admitting: Emergency Medicine

## 2019-03-03 ENCOUNTER — Other Ambulatory Visit: Payer: Self-pay

## 2019-03-03 ENCOUNTER — Encounter (HOSPITAL_COMMUNITY): Payer: Self-pay

## 2019-03-03 DIAGNOSIS — Z87891 Personal history of nicotine dependence: Secondary | ICD-10-CM | POA: Diagnosis not present

## 2019-03-03 DIAGNOSIS — R1013 Epigastric pain: Secondary | ICD-10-CM

## 2019-03-03 DIAGNOSIS — F419 Anxiety disorder, unspecified: Secondary | ICD-10-CM | POA: Insufficient documentation

## 2019-03-03 DIAGNOSIS — I10 Essential (primary) hypertension: Secondary | ICD-10-CM | POA: Insufficient documentation

## 2019-03-03 DIAGNOSIS — E860 Dehydration: Secondary | ICD-10-CM | POA: Insufficient documentation

## 2019-03-03 DIAGNOSIS — J45909 Unspecified asthma, uncomplicated: Secondary | ICD-10-CM | POA: Insufficient documentation

## 2019-03-03 DIAGNOSIS — R112 Nausea with vomiting, unspecified: Secondary | ICD-10-CM | POA: Diagnosis not present

## 2019-03-03 LAB — COMPREHENSIVE METABOLIC PANEL
ALT: 19 U/L (ref 0–44)
AST: 25 U/L (ref 15–41)
Albumin: 4.6 g/dL (ref 3.5–5.0)
Alkaline Phosphatase: 63 U/L (ref 38–126)
Anion gap: 14 (ref 5–15)
BUN: 6 mg/dL (ref 6–20)
CO2: 24 mmol/L (ref 22–32)
Calcium: 9.8 mg/dL (ref 8.9–10.3)
Chloride: 101 mmol/L (ref 98–111)
Creatinine, Ser: 0.97 mg/dL (ref 0.44–1.00)
GFR calc Af Amer: >60 mL/min (ref >60)
GFR calc non Af Amer: >60 mL/min (ref >60)
Glucose, Bld: 112 mg/dL — ABNORMAL HIGH (ref 70–99)
Potassium: 3.2 mmol/L — ABNORMAL LOW (ref 3.5–5.1)
Sodium: 139 mmol/L (ref 135–145)
Total Bilirubin: 0.8 mg/dL (ref 0.3–1.2)
Total Protein: 8.5 g/dL — ABNORMAL HIGH (ref 6.5–8.1)

## 2019-03-03 LAB — CBC WITH DIFFERENTIAL/PLATELET
Abs Immature Granulocytes: 0.02 10*3/uL (ref 0.00–0.07)
Basophils Absolute: 0 10*3/uL (ref 0.0–0.1)
Basophils Relative: 0 %
Eosinophils Absolute: 0 10*3/uL (ref 0.0–0.5)
Eosinophils Relative: 0 %
HCT: 40.2 % (ref 36.0–46.0)
Hemoglobin: 13.7 g/dL (ref 12.0–15.0)
Immature Granulocytes: 0 %
Lymphocytes Relative: 37 %
Lymphs Abs: 3 10*3/uL (ref 0.7–4.0)
MCH: 28 pg (ref 26.0–34.0)
MCHC: 34.1 g/dL (ref 30.0–36.0)
MCV: 82 fL (ref 80.0–100.0)
Monocytes Absolute: 0.7 10*3/uL (ref 0.1–1.0)
Monocytes Relative: 8 %
Neutro Abs: 4.4 10*3/uL (ref 1.7–7.7)
Neutrophils Relative %: 55 %
Platelets: 374 10*3/uL (ref 150–400)
RBC: 4.9 MIL/uL (ref 3.87–5.11)
RDW: 15.6 % — ABNORMAL HIGH (ref 11.5–15.5)
WBC: 8 10*3/uL (ref 4.0–10.5)
nRBC: 0 % (ref 0.0–0.2)

## 2019-03-03 LAB — URINALYSIS, ROUTINE W REFLEX MICROSCOPIC
Bacteria, UA: NONE SEEN
Bilirubin Urine: NEGATIVE
Glucose, UA: NEGATIVE mg/dL
Ketones, ur: 5 mg/dL — AB
Leukocytes,Ua: NEGATIVE
Nitrite: NEGATIVE
Protein, ur: NEGATIVE mg/dL
Specific Gravity, Urine: 1.006 (ref 1.005–1.030)
pH: 8 (ref 5.0–8.0)

## 2019-03-03 LAB — LIPASE, BLOOD: Lipase: 28 U/L (ref 11–51)

## 2019-03-03 LAB — I-STAT BETA HCG BLOOD, ED (MC, WL, AP ONLY): I-stat hCG, quantitative: <5 m[IU]/mL (ref <5)

## 2019-03-03 MED ORDER — LORAZEPAM 2 MG/ML IJ SOLN
1.0000 mg | Freq: Once | INTRAMUSCULAR | Status: AC
  Administered 2019-03-03: 1 mg via INTRAVENOUS
  Filled 2019-03-03: qty 1

## 2019-03-03 MED ORDER — SODIUM CHLORIDE 0.9 % IV BOLUS
1000.0000 mL | Freq: Once | INTRAVENOUS | Status: AC
  Administered 2019-03-03: 1000 mL via INTRAVENOUS

## 2019-03-03 MED ORDER — ONDANSETRON HCL 4 MG/2ML IJ SOLN
4.0000 mg | Freq: Once | INTRAMUSCULAR | Status: AC
  Administered 2019-03-03: 4 mg via INTRAVENOUS
  Filled 2019-03-03: qty 2

## 2019-03-03 MED ORDER — MORPHINE SULFATE (PF) 4 MG/ML IV SOLN
4.0000 mg | Freq: Once | INTRAVENOUS | Status: AC
  Administered 2019-03-03: 4 mg via INTRAVENOUS
  Filled 2019-03-03: qty 1

## 2019-03-03 NOTE — ED Triage Notes (Signed)
Pt arriving via stretcher, ems at bedside
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
## 2019-03-03 NOTE — Discharge Instructions (Signed)
Your laboratory testing overall is very similar to prior.  Your potassium was slightly lower than last time likely due to all the nausea and vomiting you have been having.  After rehydration and medications, you are feeling better and you are able to rest comfortably.  As we agreed, since your work-up is reassuring, we are going to get you discharged to have you go to your GI appointment this morning.  Please go to this appointment.  Please follow-up on the recommendations.  If any symptoms change or worsen, please return to the nearest emergency department.

## 2019-03-03 NOTE — ED Triage Notes (Signed)
From home acute abd pain since this morning. GI appointment today but can't make it, stress induces pain. HR 102 158/82 CBG 131, eating and drinking makes it worse.

## 2019-03-04 LAB — URINE CULTURE: Culture: <10000 — AB

## 2019-03-05 ENCOUNTER — Other Ambulatory Visit: Payer: Self-pay | Admitting: Family Medicine

## 2019-03-12 ENCOUNTER — Encounter: Payer: Self-pay | Admitting: Obstetrics & Gynecology

## 2019-03-12 ENCOUNTER — Ambulatory Visit (INDEPENDENT_AMBULATORY_CARE_PROVIDER_SITE_OTHER): Payer: Medicaid Other | Admitting: Obstetrics & Gynecology

## 2019-03-12 ENCOUNTER — Other Ambulatory Visit: Payer: Self-pay

## 2019-03-12 VITALS — BP 128/87 | HR 106 | Wt 196.5 lb

## 2019-03-12 DIAGNOSIS — Z1331 Encounter for screening for depression: Secondary | ICD-10-CM | POA: Diagnosis not present

## 2019-03-12 DIAGNOSIS — Z658 Other specified problems related to psychosocial circumstances: Secondary | ICD-10-CM | POA: Diagnosis not present

## 2019-03-12 DIAGNOSIS — N920 Excessive and frequent menstruation with regular cycle: Secondary | ICD-10-CM | POA: Diagnosis not present

## 2019-03-12 DIAGNOSIS — D250 Submucous leiomyoma of uterus: Secondary | ICD-10-CM

## 2019-03-12 MED ORDER — LEUPROLIDE ACETATE (3 MONTH) 11.25 MG IM KIT
11.2500 mg | PACK | Freq: Once | INTRAMUSCULAR | Status: AC
  Administered 2019-03-12: 11.25 mg via INTRAMUSCULAR

## 2019-03-12 NOTE — Patient Instructions (Addendum)
Vaginal Hysterectomy  A vaginal hysterectomy is a procedure to remove all or part of the uterus through a small incision in the vagina. In this procedure, your health care provider may remove your entire uterus, including the lower end (cervix). You may need a vaginal hysterectomy to treat:  Uterine fibroids.  A condition that causes the lining of the uterus to grow in other areas (endometriosis).  Problems with pelvic support.  Cancer of the cervix, ovaries, uterus, or tissue that lines the uterus (endometrium).  Excessive (dysfunctional) uterine bleeding. When removing your uterus, your health care provider may also remove the organs that produce eggs (ovaries) and the tubes that carry eggs to your uterus (fallopian tubes). After a vaginal hysterectomy, you will no longer be able to have a baby. You will also no longer get your menstrual period. Tell a health care provider about:  Any allergies you have.  All medicines you are taking, including vitamins, herbs, eye drops, creams, and over-the-counter medicines.  Any problems you or family members have had with anesthetic medicines.  Any blood disorders you have.  Any surgeries you have had.  Any medical conditions you have.  Whether you are pregnant or may be pregnant. What are the risks? Generally, this is a safe procedure. However, problems may occur, including:  Bleeding.  Infection.  A blood clot that forms in your leg and travels to your lungs (pulmonary embolism).  Damage to surrounding organs.  Pain during sex. What happens before the procedure?  Ask your health care provider what organs will be removed during surgery.  Ask your health care provider about: ? Changing or stopping your regular medicines. This is especially important if you are taking diabetes medicines or blood thinners. ? Taking medicines such as aspirin and ibuprofen. These medicines can thin your blood. Do not take these medicines before your  procedure if your health care provider instructs you not to.  Follow instructions from your health care provider about eating or drinking restrictions.  Do not use any tobacco products, such as cigarettes, chewing tobacco, and e-cigarettes. If you need help quitting, ask your health care provider.  Plan to have someone take you home after discharge from the hospital. What happens during the procedure?  To reduce your risk of infection: ? Your health care team will wash or sanitize their hands. ? Your skin will be washed with soap.  An IV tube will be inserted into one of your veins.  You may be given antibiotic medicine to help prevent infection.  You will be given one or more of the following: ? A medicine to help you relax (sedative). ? A medicine to numb the area (local anesthetic). ? A medicine to make you fall asleep (general anesthetic). ? A medicine that is injected into an area of your body to numb everything beyond the injection site (regional anesthetic).  Your surgeon will make an incision in your vagina.  Your surgeon will locate and remove all or part of your uterus.  Your ovaries and fallopian tubes may be removed at the same time.  The incision will be closed with stitches (sutures) that dissolve over time. The procedure may vary among health care providers and hospitals. What happens after the procedure?  Your blood pressure, heart rate, breathing rate, and blood oxygen level will be monitored often until the medicines you were given have worn off.  You will be encouraged to get up and walk around after a few hours to help prevent complications.  You may have IV tubes in place for a few days.  You will be given pain medicine as needed.  Do not drive for 24 hours if you were given a sedative. This information is not intended to replace advice given to you by your health care provider. Make sure you discuss any questions you have with your health care  provider. Document Revised: 08/19/2015 Document Reviewed: 01/10/2015 Elsevier Patient Education  2020 [employer].    [employer]  Department of Social [facility] [address] [place], [place] [address] [phone]   or  www.http://-garner.info/ **SNAP/EBT/ Other nutritional benefits  [facility] [address], [place], [place] [address] [phone]  or  https://palmer-smith.com/ **WIC for  women who are pregnant and postpartum, infants and children up to 80 years old  [place] [address], [place], [place] [address] [phone]   or   www.theblessedtable.org  **Food pantry  Brother Kolbe[facility] [place], [place], [place] [address] [phone]   or   https://brotherkolbes.godaddysites.com  **Emergency food and prepared meals  [place] [address] [place], [place] [address] [phone]   or   www.cedargrovetop.us **Food pantry  [place] [facility] [address] [place], [place] [address] [phone]   or   www.https://hartman-jones.net/ **Food pantry  [employer] [facility]ry [address], [place], [place] [address] [phone] **Food pantry  [facility] [address] [place], [place] [address] [phone]   or   www.greensborourbanministry.org  Insurance underwriter and prepared meals  [facility] [address] [place], [place], [place], [place] [address] DomainerFinder.be  **Food pantry  [place] [employer] Food Pantry [address] [place], [place] [address] [phone]   or   www.lbcnow.org  **Food pantry  One Step Further [address] [place], [place] [address] [phone]   or    http://patterson-parker.net/ **Food pantry, nutrition education, gardening activities  [place] [address] [place], [place] [address] [phone] **Food pantry  [facility] [place] [address] [place], [place] [address] [phone]   or   www.salvationarmyofgreensboro.[place]ovette Cliche of [place], [place], [place] [address] [phone]   or   http://senior-resources-guilford.org [facility] [place] [address] [place], [place] [address] [phone]   or   www.stmattchurch.com  **Food pantry  [place] [address], [place], [place] [address] [phone]   or   vandaliapresbyterianchurch.org **Food pantry  [place] [facility] [address] [place], [place], [place] [address] [phone]   or   www.Mormon101.be **Food pantry  [place] [address] [place], [place] [address] [phone] **Food pantry  [employer]ie Resources   Department of [facility] [address], [place], [place] [address] [phone]   or   www.co.Nunda.Dunlap.us/ph/  [facility]) [address] [place], [place] [address] [phone]   or   MedicationWebsites.com.au **WIC for pregnant and postpartum women, infants and children up to 74 years old  [facility] [address], [place],  [address] [phone] **Food pantry  [place] [address], [place],  [address] [phone]   or   emerywoodbaptistchurch.com *Food pantry  Five loaves  [place] [address] [place], [place] [address] [phone]   or    www.fcchighpoint.Radonna RickerFood pantry  [facility] [address] [place], [place] [address] [phone]   or   http://www.green.com/ **Food pantry  [place] [address] [place], [place] [address] [phone]   or   www.facebook.com/KBCI1 Social worker of [place] [address] [place], [place] [address] [phone]   or   www.abbottscreek.org [employer] of [place] [phone]   **Delivers meals  New Beginnings Full [facility] [address] [place], [place] [address] [phone]   or   nbfgm.sundaystreamwebsites.com  Programme researcher, broadcasting/film/video of [employer] [address], [place], [place] [address] [phone]   or   www.odm-hp.org  **Food pantry  [place] [address] [place], [place] [address] [phone]   or   R2live.tv **Food pantry  [place] [address] [place], [place] [address] [phone]   or   ClubMonetize.fr **Emergency food and pet food  [place] [place] [address], [place], [place] [address] [phone]   or   www.senioradults.org **Congregate and delivered meals to older adults  [place] Way of Greater [employer] [address] [place], [place] [address] [phone]   or   SamedayNews.com.cy **Back Pack Program for elementary school students  [place] [address], [place], [place] [address] [phone]   or   www.wardstreetcommunityresources.org **Food pantry  [facility] [address], [place], [place] [address] [phone]   or   https://www.carlson.net/ **Emergency food, nutrition classes, food budgeting  Food Resources [place]  [address] [place], [place], [place] [address] [phone]  or    www.co.rockingham.Griffith.us/pview.aspx?id=[email or web address]&catid=407 **SNAP/Other nutrition benefits  [place], [place], [place] [address] [phone]   or  http://goodwin-walker.biz/  **SNAP/Other nutrition benefits  [place], [place], [place] [address] [phone]  or  http://www.rockinghamcountypublichealth.org **WIC for pregnant and postpartum women, infants and children up to 75 years old  Aging, Disability and [place] [address] [place], [place] [address] [phone]  or www.risodejaneiro.com **Prepared meals for older adults  [place] [address], [place], [place] [address] [phone]  or  NetworkingMixer.com.ee **Food pantry  [facility] [address] [place], [place] [address] [phone]   or   https://www.handsofgod.org/  Insurance underwriter  Men in [place] [address] [place] [address], [place], [place] [address] [phone] **Food pantry  [facility] for Vivian [place], [place], [place] [address] [phone]   or   www.ci.[place].us/government/parks_and_recreation/senior_center/index.php **Congregate meal for older adults  [facility] [address], [place], [place] [address] [phone]   or www.reidsvilleoutreachcenter.org  **Food pantry  [facility] [address], [place], [place] [address] [phone]   or   http://reid-chang.com/ Quarry manager Resources [place] ([place]) [place], [place], [place] [address] https://www.[place].gov/departments/transportation/gdot-divisions/[place]-transit-agency-public-transportation-division     . Fixed-route  bus services, including regional fare cards for PART, [place], [place], and WSTA buses.  . Reduced fare bus ID's available  for Medicaid, Medicare, and "orange card" recipients.  Marland Kitchen SCAT offers curb-to-curb and door-to-door bus services for people with disabilities who are unable to use a fixed-bus route; also offers a shared-ride program.   Helpful tips:  -Routes available online and physical maps available at the main bus hub lobby (each for a specific route) -Smartphone directions often include bus routes (see the "bus" icon, next to the "car" and "walk" icons) -Routes differ on weekends, evenings and holidays, so plan ahead!  -If you have Medicaid, Medicare, or orange card, plan to obtain a reduced-fare ID to save 50% on rides. Check days and times to obtain an ID, and bring all necessary documents.   [employer] System [place]) [place], [place]. [address] [place], [place] [address] [phone] http://www.smith-bell.org/ **Fixed-bus route services, and demand response bus service for older adults  Department of Social [facility] [address], [place], [place] [address] [phone] www.ProfileWatcher.fi **Medicaid transportation is available to [facility] recipients who need assistance getting to [facility] medical appointments and providers  [facility] [address] [place] [address], [place], [place] [address] www.cjmedicaltransportation.com  ** Offers non-emergency transportation for medical appointments  Wheels [place], [place], [place] [address] [phone] www.wheels4hope.org **REFERRAL NEEDED by specific agencies (see website), after meeting specified criteria only  [employer]ant for [employer]) [address] [place], [place] [address] [phone]  BikerFestival.is  *Regional fixed-bus routes between counties (example:  [place] to [place]) and [employer] of [place], [place], [place] [address] [phone] Http://senior-resources-guilford.org  Production assistant, radio available (call or see website for details)  [place] [address] [place], [place] [address] [phone] www.senioradults.org  **[place], Disability and [place] [address] [place], [place] [address] [phone] www.adtsrc.[place] Department of Health and [facility] [place], [place], [place] [address] [phone]  http://goodwin-walker.biz/  **Transportation expense assistance  Department of [place] [address] [place], [place], [place] [address] [phone] www.co.rockingham.Greenlee.us/pview.aspx?id=[email or web address]&catid=407  **Medicaid transportation for recipients who need assistance getting to [facility] medical appointments and providers

## 2019-03-12 NOTE — Progress Notes (Signed)
GAD-7 and PHQ-9 screenings are positive. Offered appt with [facility], pt is interested. Ambulatory referral for integrated behavorial health ordered.  Apolonio Schneiders RN 03/12/19
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
## 2019-03-13 ENCOUNTER — Other Ambulatory Visit: Payer: Self-pay | Admitting: Obstetrics & Gynecology

## 2019-03-13 NOTE — Progress Notes (Signed)
Orders for [facility] BS

## 2019-03-14 ENCOUNTER — Telehealth: Payer: Self-pay | Admitting: Family Medicine

## 2019-03-14 NOTE — Telephone Encounter (Signed)
Pt is calling cant wait till the 17th for appt, hospital said she's been having crippling anxiety been in hospital for it 4 times since march 1st. She's wanting Dr. To call her in something. Please call and advise pt. Thanks

## 2019-03-14 NOTE — Telephone Encounter (Signed)
Dear Holly Mendez Team See if she wants a virtual appointment with me next Monday. If so, I will get a template and see her at 1:30 Pm Monday THANKS! [name]

## 2019-03-14 NOTE — Telephone Encounter (Signed)
Patient said 1:30 on Monday would be fine. "Thank you Dr. Nori Riis". Holly Mendez, CMA

## 2019-03-17 NOTE — Telephone Encounter (Signed)
Pt called and said she was told she was scheduled for a virtual appointment with Dr. Nori Riis today 03/17/19 at 1:30. It is not scheduled but I did see below that she was willing to make a template to see her.   I explained to her that it was discussed but the appointment was never made. Pt was very understanding. She would like to know if Dr. Nori Riis can call her to discuss.

## 2019-03-21 ENCOUNTER — Institutional Professional Consult (permissible substitution): Payer: Medicaid Other

## 2019-03-25 ENCOUNTER — Institutional Professional Consult (permissible substitution): Payer: Medicaid Other

## 2019-03-25 NOTE — Telephone Encounter (Signed)
Dear [name] Team  Tell her I am so sorry---I was on inpatient last week and somehow I missed this note. Please call  her and see if she wants a virtual appt with me tomorrow. If so,I can do one  At end of my AM clinic [facility]! [name]

## 2019-03-26 ENCOUNTER — Encounter: Payer: Self-pay | Admitting: Family Medicine

## 2019-03-26 ENCOUNTER — Other Ambulatory Visit: Payer: Self-pay

## 2019-03-26 ENCOUNTER — Ambulatory Visit (INDEPENDENT_AMBULATORY_CARE_PROVIDER_SITE_OTHER): Payer: Medicaid Other | Admitting: Family Medicine

## 2019-03-26 DIAGNOSIS — R1013 Epigastric pain: Secondary | ICD-10-CM | POA: Diagnosis present

## 2019-03-26 DIAGNOSIS — G8929 Other chronic pain: Secondary | ICD-10-CM | POA: Diagnosis not present

## 2019-03-26 MED ORDER — DIAZEPAM 5 MG PO TABS: ORAL_TABLET | ORAL | 0 refills | Status: DC

## 2019-03-26 NOTE — Patient Instructions (Signed)
I sent in some Valium to help you with your preprocedure anxiety.  You can take 1 the night before.  The morning of your procedure, take 1 to 2 hours before your procedure and if you are still anxious you may repeat times one 1 hour before the procedure.  Please let your doctor know that I have given you this but it should not interfere.  I would have you follow-up with your mental health provider about what seems to be crippling anxiety.  I do think that is contributing to your stomach issues.  Please follow-up with me after you see your mental health provider next month and we can touch base again.

## 2019-03-28 NOTE — Progress Notes (Signed)
    CHIEF COMPLAINT / HPI: 1. Chronic stomach pain; saw the GI doctor and is scheduled for EGD. She is having significant increase in anxiety with any doctors visits and is afraid she wil be unable to go to the procedure. This is much worse since she had an "episode' at time of her scheduled hysterectomy; she became really anxious and "started to shake" and they cancelled surgery. She has been to ED 2x recently with increasing anxiety symptoms. Is followed by [facility] and has been consistent with her medicines. She also sees a therapist there. Next appt is 3 weeks away.   PERTINENT  PMH / PSH: I have reviewed the patient's medications, allergies, past medical and surgical history, smoking status and updated in the EMR as appropriate.   OBJECTIVE:  BP 124/86   Pulse (!) 104   Wt 207 lb 3.2 oz (94 kg)   SpO2 100%   BMI 34.48 kg/m    ASSESSMENT / PLAN:   Abdominal pain, chronic, epigastric Has EGD scheduled and I think that is important to complete so will give her 3 tabs of diazepam to combat her new sz of agoraphobia type anxiety. She will follow up with her mental health provedier for long term medication regarding this isue.   [name] MD

## 2019-03-28 NOTE — Assessment & Plan Note (Signed)
Has EGD scheduled and I think that is important to complete so will give her 3 tabs of diazepam to combat her new sz of agoraphobia type anxiety. She will follow up with her mental health provedier for long term medication regarding this isue.

## 2019-04-07 ENCOUNTER — Other Ambulatory Visit: Payer: Self-pay | Admitting: Family Medicine

## 2019-04-14 ENCOUNTER — Telehealth: Payer: Self-pay | Admitting: Family Medicine

## 2019-04-14 DIAGNOSIS — R1013 Epigastric pain: Secondary | ICD-10-CM

## 2019-04-14 DIAGNOSIS — G8929 Other chronic pain: Secondary | ICD-10-CM

## 2019-04-14 NOTE — Telephone Encounter (Signed)
Pt needs a new referral to [facility] for pain management she would like to go to the [place] office with Dr. Jeanella Anton.

## 2019-04-17 ENCOUNTER — Ambulatory Visit (INDEPENDENT_AMBULATORY_CARE_PROVIDER_SITE_OTHER): Payer: Medicaid Other

## 2019-04-17 ENCOUNTER — Ambulatory Visit (HOSPITAL_COMMUNITY)
Admission: EM | Admit: 2019-04-17 | Discharge: 2019-04-17 | Disposition: A | Discharge status: Home / Self Care | Payer: Medicaid Other | Attending: Family Medicine | Admitting: Family Medicine

## 2019-04-17 ENCOUNTER — Encounter (HOSPITAL_COMMUNITY): Payer: Self-pay

## 2019-04-17 ENCOUNTER — Other Ambulatory Visit: Payer: Self-pay

## 2019-04-17 DIAGNOSIS — S6992XA Unspecified injury of left wrist, hand and finger(s), initial encounter: Secondary | ICD-10-CM

## 2019-04-17 DIAGNOSIS — M79645 Pain in left finger(s): Secondary | ICD-10-CM
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
## 2019-04-17 NOTE — Discharge Instructions (Signed)
I suspect likely tendon rupture within thumb- please follow up with hand specialist for further evaluation of this Please wear splint for now to immobilize thumb Tylenol and ibuprofen for pain May ice thumb

## 2019-04-21 ENCOUNTER — Telehealth: Payer: Self-pay | Admitting: *Deleted

## 2019-04-21 ENCOUNTER — Telehealth: Payer: Self-pay | Admitting: Family Medicine

## 2019-04-21 DIAGNOSIS — S6702XA Crushing injury of left thumb, initial encounter: Secondary | ICD-10-CM

## 2019-04-21 DIAGNOSIS — S6992XA Unspecified injury of left wrist, hand and finger(s), initial encounter: Secondary | ICD-10-CM

## 2019-04-21 NOTE — Telephone Encounter (Signed)
Pt calls asking for a referral to a hand surgeon per UC.  When advised that she may need an appt first, she demands to speak with Kennyth Lose "who ALWAYS puts in my referrals".  When Shae attempted to inform pt that referrals had to come from PCP, she becomes upset and uses profanity toward [facility].  She demands to speak with me Careers adviser).  At the time I was in a meeting so I asked Darol Destine to get her number and I would call her back.   A few minutes later, [facility] calls back and states that they can take a verbal to see patient.  Shae and I spoke with Dr. Nori Riis, verbal given and [facility] relayed verbal.  Placing referral now. Christen Bame, CMA

## 2019-04-22 ENCOUNTER — Encounter: Payer: Self-pay | Admitting: Family Medicine

## 2019-04-22 NOTE — Progress Notes (Signed)
Spoke with patient about her request for orthopedic referral.  She apologized for arguing with the front office staff, using inappropriate language.  She said did not understand that they were certain requirements for referrals such as being seen by the primary care physician.  She said she thought the front office staff was just trying to give her a hard time.  I explained the difference.  I also asked her to please be civil and professional with the front office staff at all times and to not use any foul language.  She agreed.  In the future if there are problems, I told her to please leave me a message in my nurse that I would get back with her.

## 2019-04-23 ENCOUNTER — Telehealth: Payer: Self-pay | Admitting: Obstetrics & Gynecology

## 2019-04-23 NOTE — Telephone Encounter (Signed)
Patient called in stating that at her last visit with Dr. Roselie Awkward he wanted her to start taking her megestarol 3 times a day. She is now out of this medication and the pharmacy will not refill it until they hear from the office. Patient instructed a message will be sent to the nurse and this will be taken care of. Patient verbalized understanding and message sent to clinical pool

## 2019-04-24 ENCOUNTER — Telehealth: Payer: Self-pay

## 2019-04-24 ENCOUNTER — Other Ambulatory Visit: Payer: Self-pay | Admitting: Family Medicine

## 2019-04-24 DIAGNOSIS — N920 Excessive and frequent menstruation with regular cycle: Secondary | ICD-10-CM

## 2019-04-24 DIAGNOSIS — D250 Submucous leiomyoma of uterus: Secondary | ICD-10-CM

## 2019-04-24 MED ORDER — MEGESTROL ACETATE 40 MG PO TABS: 40.0000 mg | ORAL_TABLET | Freq: Three times a day (TID) | ORAL | 0 refills | Status: DC

## 2019-04-24 NOTE — Telephone Encounter (Signed)
Pt called to request that she comes in to sign a hysterectomy statement.  Pt states that she has transportation issues so pt given [employer].  Pt c/o continuing to have vaginal bleeding after receiving Lupron in March.  Pt states that Dr. Roselie Awkward stated that she could take Megace 40 mg po tid to help bleeding.  Pt wants to know if she can get the prescribed.  Dr. Roselie Awkward approved pt's request for Megace refill and medication e-prescribed to [facility] as pt requested.    Mel Almond, RN

## 2019-04-24 NOTE — Telephone Encounter (Signed)
Pt concern addressed via MyChart.   Mel Almond, RN

## 2019-04-25 ENCOUNTER — Other Ambulatory Visit: Payer: Self-pay | Admitting: Family Medicine

## 2019-05-02 ENCOUNTER — Other Ambulatory Visit: Payer: Self-pay

## 2019-05-02 MED ORDER — METRONIDAZOLE 500 MG PO TABS: ORAL_TABLET | ORAL | 1 refills | Status: DC

## 2019-05-19 ENCOUNTER — Encounter (HOSPITAL_COMMUNITY): Payer: Self-pay

## 2019-05-19 NOTE — Patient Instructions (Addendum)
DUE TO COVID-19 ONLY ONE VISITOR IS ALLOWED IN WAITING ROOM (VISITOR WILL HAVE A TEMPERATURE CHECK ON ARRIVAL AND MUST WEAR A FACE MASK THE ENTIRE TIME.)  ONCE YOU ARE ADMITTED TO YOUR PRIVATE ROOM, THE SAME ONE VISITOR IS ALLOWED TO VISIT DURING VISITING HOURS ONLY.  Your COVID swab testing is scheduled for Friday, May 23, 2019  at 10:30 AM , You must self quarantine after your testing per handout given to you at the testing site.  ([place] up testing enter pre-surgical testing line)    Your procedure is scheduled on: Tuesday, May 27, 2019  Report to [facility] AT  11:00 A. M.   Call this number if you have problems the morning of surgery:  [phone].   OUR ADDRESS IS [place].  WE ARE LOCATED IN THE [facility]                                   [facility].                                     REMEMBER:  DO NOT EAT FOOD  AFTER MIDNIGHT .    MAY HAVE LIQUIDS UNTIL 7:00 AM DAY OF SURGERY  CLEAR LIQUID DIET  Foods Allowed                                                                     Foods Excluded  Water, Black Coffee and tea, regular and decaf                             liquids that you cannot  Plain Jell-O in any flavor  (No red)                                           see through such as: Fruit ices (not with fruit pulp)                                     milk, soups, orange juice  Iced Popsicles (No red)                                    All solid food Carbonated beverages, regular and diet                                    Apple juices Sports drinks like Gatorade (No red) Lightly seasoned clear broth or consume(fat free) Sugar, honey syrup  Sample Menu Breakfast  Lunch                                     Supper Cranberry juice                    Beef broth                            Chicken broth Jell-O                                     Grape juice                            Apple juice Coffee or tea                        Jell-O                                      Popsicle                                                Coffee or tea                        Coffee or tea  BRUSH YOUR TEETH THE MORNING OF SURGERY.  TAKE THESE MEDICATIONS MORNING OF SURGERY WITH A SIP OF WATER:   NEXIUM, LATUDA,  USE SYMBICORT MORNING OF SURGERY  BRING RESCUE ASTHMA INHALER DAY OF SURGERY  DO NOT WEAR JEWERLY, MAKE UP, OR NAIL POLISH.  DO NOT WEAR LOTIONS, POWDERS, PERFUMES/COLOGNE OR DEODORANT.  DO NOT SHAVE FOR 24 HOURS PRIOR TO DAY OF SURGERY.  CONTACTS, GLASSES, OR DENTURES MAY NOT BE WORN TO SURGERY.                                    [facility] IS NOT RESPONSIBLE  FOR ANY BELONGINGS.          BRING ALL PRESCRIPTION MEDICATIONS WITH YOU THE DAY OF SURGERY IN ORIGINAL CONTAINERS                                                               [facility] - Preparing for Surgery Before surgery, you can play an important role.  Because skin is not sterile, your skin needs to be as free of germs as possible.  You can reduce the number of germs on your skin by washing with CHG (chlorahexidine gluconate) soap before surgery.  CHG is an antiseptic cleaner which kills germs and bonds with the skin to continue killing germs even after washing. Please DO NOT use if you have an allergy to CHG or antibacterial soaps.  If your skin becomes reddened/irritated stop  using the CHG and inform your nurse when you arrive at Short Stay. Do not shave (including legs and underarms) for at least 48 hours prior to the first CHG shower.  You may shave your face/neck.  Please follow these instructions carefully:  1.  Shower with CHG Soap the night before surgery and the  morning of surgery.  2.  If you choose to wash your hair, wash your hair first as usual with your normal  shampoo.  3.  After you shampoo, rinse your hair and body thoroughly to remove the shampoo.                              4.  Use CHG as you would any other liquid soap.  You can apply chg directly to the skin and wash.  Gently with a scrungie or clean washcloth.  5.  Apply the CHG Soap to your body ONLY FROM THE NECK DOWN.   Do   not use on face/ open                           Wound or open sores. Avoid contact with eyes, ears mouth and   genitals (private parts).                       Wash face,  Genitals (private parts) with your normal soap.             6.  Wash thoroughly, paying special attention to the area where your    surgery  will be performed.  7.  Thoroughly rinse your body with warm water from the neck down.  8.  DO NOT shower/wash with your normal soap after using and rinsing off the CHG Soap.                9.  Pat yourself dry with a clean towel.            10.  Wear clean pajamas.            11.  Place clean sheets on your bed the night of your first shower and do not  sleep with pets. Day of Surgery : Do not apply any lotions/deodorants the morning of surgery.  Please wear clean clothes to the hospital/surgery center.  FAILURE TO FOLLOW THESE INSTRUCTIONS MAY RESULT IN THE CANCELLATION OF YOUR SURGERY  PATIENT SIGNATURE_________________________________  NURSE SIGNATURE__________________________________  ________________________________________________________________________  WHAT IS A BLOOD TRANSFUSION? Blood Transfusion Information  A transfusion is the replacement of blood or some of its parts. Blood is made up of multiple cells which provide different functions.  Red blood cells carry oxygen and are used for blood loss replacement.  White blood cells fight against infection.  Platelets control bleeding.  Plasma helps clot blood.  Other blood products are available for specialized needs, such as hemophilia or other clotting disorders. BEFORE THE TRANSFUSION  Who gives blood for transfusions?   Healthy volunteers who are fully evaluated to make sure their  blood is safe. This is blood bank blood. Transfusion therapy is the safest it has ever been in the practice of medicine. Before blood is taken from a donor, a complete history is taken to make sure that person has no history of diseases nor engages in risky social behavior (examples are intravenous drug use or sexual activity with multiple partners). The donor's travel history  is screened to minimize risk of transmitting infections, such as malaria. The donated blood is tested for signs of infectious diseases, such as HIV and hepatitis. The blood is then tested to be sure it is compatible with you in order to minimize the chance of a transfusion reaction. If you or a relative donates blood, this is often done in anticipation of surgery and is not appropriate for emergency situations. It takes many days to process the donated blood. RISKS AND COMPLICATIONS Although transfusion therapy is very safe and saves many lives, the main dangers of transfusion include:   Getting an infectious disease.  Developing a transfusion reaction. This is an allergic reaction to something in the blood you were given. Every precaution is taken to prevent this. The decision to have a blood transfusion has been considered carefully by your caregiver before blood is given. Blood is not given unless the benefits outweigh the risks. AFTER THE TRANSFUSION  Right after receiving a blood transfusion, you will usually feel much better and more energetic. This is especially true if your red blood cells have gotten low (anemic). The transfusion raises the level of the red blood cells which carry oxygen, and this usually causes an energy increase.  The nurse administering the transfusion will monitor you carefully for complications. HOME CARE INSTRUCTIONS  No special instructions are needed after a transfusion. You may find your energy is better. Speak with your caregiver about any limitations on activity for underlying diseases you  may have. SEEK MEDICAL CARE IF:   Your condition is not improving after your transfusion.  You develop redness or irritation at the intravenous (IV) site. SEEK IMMEDIATE MEDICAL CARE IF:  Any of the following symptoms occur over the next 12 hours:  Shaking chills.  You have a temperature by mouth above 102 F (38.9 C), not controlled by medicine.  Chest, back, or muscle pain.  People around you feel you are not acting correctly or are confused.  Shortness of breath or difficulty breathing.  Dizziness and fainting.  You get a rash or develop hives.  You have a decrease in urine output.  Your urine turns a dark color or changes to pink, red, or brown. Any of the following symptoms occur over the next 10 days:  You have a temperature by mouth above 102 F (38.9 C), not controlled by medicine.  Shortness of breath.  Weakness after normal activity.  The white part of the eye turns yellow (jaundice).  You have a decrease in the amount of urine or are urinating less often.  Your urine turns a dark color or changes to pink, red, or brown. Document Released: 12/24/1999 Document Revised: 03/20/2011 Document Reviewed: 08/12/2007 [facility] Patient Information 2014 [place], [place].  _______________________________________________________________________

## 2019-05-20 ENCOUNTER — Encounter (HOSPITAL_COMMUNITY)
Admission: RE | Admit: 2019-05-20 | Discharge: 2019-05-20 | Disposition: A | Discharge status: Home / Self Care | Payer: Medicaid Other | Source: Ambulatory Visit | Attending: Obstetrics & Gynecology | Admitting: Obstetrics & Gynecology

## 2019-05-20 ENCOUNTER — Encounter (HOSPITAL_COMMUNITY): Payer: Self-pay

## 2019-05-20 ENCOUNTER — Other Ambulatory Visit: Payer: Self-pay

## 2019-05-20 ENCOUNTER — Telehealth (INDEPENDENT_AMBULATORY_CARE_PROVIDER_SITE_OTHER): Payer: Medicaid Other | Admitting: General Practice

## 2019-05-20 DIAGNOSIS — Z0289 Encounter for other administrative examinations: Secondary | ICD-10-CM

## 2019-05-20 DIAGNOSIS — Z01812 Encounter for preprocedural laboratory examination: Secondary | ICD-10-CM | POA: Diagnosis present

## 2019-05-20 HISTORY — DX: Vitamin D deficiency, unspecified: E55.9

## 2019-05-20 LAB — CBC
HCT: 39.6 % (ref 36.0–46.0)
Hemoglobin: 12.8 g/dL (ref 12.0–15.0)
MCH: 28 pg (ref 26.0–34.0)
MCHC: 32.3 g/dL (ref 30.0–36.0)
MCV: 86.7 fL (ref 80.0–100.0)
Platelets: 302 10*3/uL (ref 150–400)
RBC: 4.57 MIL/uL (ref 3.87–5.11)
RDW: 15.1 % (ref 11.5–15.5)
WBC: 8.1 10*3/uL (ref 4.0–10.5)
nRBC: 0 % (ref 0.0–0.2)

## 2019-05-20 LAB — BASIC METABOLIC PANEL
Anion gap: 9 (ref 5–15)
BUN: 14 mg/dL (ref 6–20)
CO2: 28 mmol/L (ref 22–32)
Calcium: 9.7 mg/dL (ref 8.9–10.3)
Chloride: 104 mmol/L (ref 98–111)
Creatinine, Ser: 0.82 mg/dL (ref 0.44–1.00)
GFR calc Af Amer: >60 mL/min (ref >60)
GFR calc non Af Amer: >60 mL/min (ref >60)
Glucose, Bld: 104 mg/dL — ABNORMAL HIGH (ref 70–99)
Potassium: 4.2 mmol/L (ref 3.5–5.1)
Sodium: 141 mmol/L (ref 135–145)

## 2019-05-20 LAB — ABO/RH: ABO/RH(D): O POS

## 2019-05-20 NOTE — Progress Notes (Signed)
PCP - Dr. Verlon Au Cardiologist - N/A  Chest x-ray - 02/26/19 in epic EKG - 03/08/19 in epic Stress Test - N/A ECHO - N/A Cardiac Cath - N/A  Sleep Study - N/A CPAP - N/A  Fasting Blood Sugar - N/A Checks Blood Sugar _N/A____ times a day  Blood Thinner Instructions: N/A Aspirin Instructions:N/A Last Dose:N/A  Anesthesia review: N/A  Patient denies shortness of breath, fever, cough and chest pain at PAT appointment   Patient verbalized understanding of instructions that were given to them at the PAT appointment. Patient was also instructed that they will need to review over the PAT instructions again at home before surgery.

## 2019-05-20 NOTE — Telephone Encounter (Signed)
Called patient and discussed coming in to sign hysterectomy statement papers. Patient states she has limited transportation and was told she could sign same day as surgery. Apologized and told patient that is incorrect, she needs to sign before or surgery will be cancelled. Offered transportation's phone number for her to call or recommended bus route. Patient verbalized understanding & states she will come tomorrow.

## 2019-05-21 ENCOUNTER — Encounter: Payer: Self-pay | Admitting: *Deleted

## 2019-05-23 ENCOUNTER — Other Ambulatory Visit (HOSPITAL_COMMUNITY)
Admission: RE | Admit: 2019-05-23 | Discharge: 2019-05-23 | Disposition: A | Discharge status: Home / Self Care | Payer: Medicaid Other | Source: Ambulatory Visit | Attending: Obstetrics & Gynecology | Admitting: Obstetrics & Gynecology

## 2019-05-23 ENCOUNTER — Telehealth: Payer: Self-pay | Admitting: *Deleted

## 2019-05-23 DIAGNOSIS — Z01812 Encounter for preprocedural laboratory examination: Secondary | ICD-10-CM | POA: Insufficient documentation

## 2019-05-23 DIAGNOSIS — Z20822 Contact with and (suspected) exposure to covid-19: Secondary | ICD-10-CM | POA: Diagnosis not present

## 2019-05-23 LAB — SARS CORONAVIRUS 2 (TAT 6-24 HRS): SARS Coronavirus 2: NEGATIVE

## 2019-05-23 NOTE — Telephone Encounter (Signed)
Signature on hysterectomy statement not legible.  Telephoned patient.  Date of birth confirmed.  Explained situation to patient - illegible signature on hysterectomy statement and how particular Medicaid is about the signature.  I explained to the patient we don't want her to get a large bill for the hysterectomy because Medicaid wouldn't pay due to the illegible signature.  Patient states understanding.  States she will come to the office on Monday to resign the document.  I explained to patient if she doesn't resign the document on Monday her surgery for Tuesday will be rescheduled.  Patient states she will "sign it 3 times if I need to".  States she doesn't want her surgery canceled as she needs it.  I explained I would have the document at the front desk for her to sign.  Patient states understanding and that she will come to the office on Monday.

## 2019-05-26 ENCOUNTER — Other Ambulatory Visit: Payer: Self-pay | Admitting: Family Medicine

## 2019-05-27 ENCOUNTER — Encounter (HOSPITAL_BASED_OUTPATIENT_CLINIC_OR_DEPARTMENT_OTHER): Payer: Self-pay | Admitting: Obstetrics & Gynecology

## 2019-05-27 ENCOUNTER — Other Ambulatory Visit: Payer: Self-pay | Admitting: Family Medicine

## 2019-05-27 ENCOUNTER — Encounter (HOSPITAL_BASED_OUTPATIENT_CLINIC_OR_DEPARTMENT_OTHER)
Admission: RE | Disposition: A | Discharge status: Home / Self Care | Payer: Self-pay | Source: Home / Self Care | Attending: Obstetrics & Gynecology

## 2019-05-27 ENCOUNTER — Other Ambulatory Visit: Payer: Self-pay

## 2019-05-27 ENCOUNTER — Ambulatory Visit (HOSPITAL_BASED_OUTPATIENT_CLINIC_OR_DEPARTMENT_OTHER): Payer: Medicaid Other | Admitting: Physician Assistant

## 2019-05-27 ENCOUNTER — Ambulatory Visit (HOSPITAL_BASED_OUTPATIENT_CLINIC_OR_DEPARTMENT_OTHER): Payer: Medicaid Other | Admitting: Certified Registered"

## 2019-05-27 ENCOUNTER — Ambulatory Visit (HOSPITAL_BASED_OUTPATIENT_CLINIC_OR_DEPARTMENT_OTHER)
Admission: RE | Admit: 2019-05-27 | Discharge: 2019-05-28 | Disposition: A | Discharge status: Home / Self Care | Payer: Medicaid Other | Attending: Obstetrics & Gynecology | Admitting: Obstetrics & Gynecology

## 2019-05-27 DIAGNOSIS — H5462 Unqualified visual loss, left eye, normal vision right eye: Secondary | ICD-10-CM | POA: Diagnosis not present

## 2019-05-27 DIAGNOSIS — F319 Bipolar disorder, unspecified: Secondary | ICD-10-CM | POA: Insufficient documentation

## 2019-05-27 DIAGNOSIS — I10 Essential (primary) hypertension: Secondary | ICD-10-CM | POA: Insufficient documentation

## 2019-05-27 DIAGNOSIS — Z7951 Long term (current) use of inhaled steroids: Secondary | ICD-10-CM | POA: Diagnosis not present

## 2019-05-27 DIAGNOSIS — D259 Leiomyoma of uterus, unspecified: Secondary | ICD-10-CM | POA: Diagnosis present

## 2019-05-27 DIAGNOSIS — D251 Intramural leiomyoma of uterus: Secondary | ICD-10-CM | POA: Diagnosis not present

## 2019-05-27 DIAGNOSIS — K219 Gastro-esophageal reflux disease without esophagitis: Secondary | ICD-10-CM | POA: Insufficient documentation

## 2019-05-27 DIAGNOSIS — F17210 Nicotine dependence, cigarettes, uncomplicated: Secondary | ICD-10-CM | POA: Diagnosis not present

## 2019-05-27 DIAGNOSIS — N938 Other specified abnormal uterine and vaginal bleeding: Secondary | ICD-10-CM | POA: Diagnosis present

## 2019-05-27 DIAGNOSIS — M797 Fibromyalgia: Secondary | ICD-10-CM | POA: Diagnosis not present

## 2019-05-27 DIAGNOSIS — K861 Other chronic pancreatitis: Secondary | ICD-10-CM | POA: Diagnosis not present

## 2019-05-27 DIAGNOSIS — N921 Excessive and frequent menstruation with irregular cycle: Secondary | ICD-10-CM | POA: Diagnosis not present

## 2019-05-27 DIAGNOSIS — J45909 Unspecified asthma, uncomplicated: Secondary | ICD-10-CM | POA: Diagnosis not present

## 2019-05-27 DIAGNOSIS — E7800 Pure hypercholesterolemia, unspecified: Secondary | ICD-10-CM | POA: Diagnosis not present

## 2019-05-27 DIAGNOSIS — F419 Anxiety disorder, unspecified: Secondary | ICD-10-CM | POA: Diagnosis not present

## 2019-05-27 DIAGNOSIS — Z79899 Other long term (current) drug therapy: Secondary | ICD-10-CM | POA: Insufficient documentation

## 2019-05-27 DIAGNOSIS — Z6839 Body mass index (BMI) 39.0-39.9, adult: Secondary | ICD-10-CM | POA: Insufficient documentation

## 2019-05-27 DIAGNOSIS — M1990 Unspecified osteoarthritis, unspecified site: Secondary | ICD-10-CM | POA: Diagnosis not present

## 2019-05-27 DIAGNOSIS — N920 Excessive and frequent menstruation with regular cycle: Secondary | ICD-10-CM

## 2019-05-27 DIAGNOSIS — F259 Schizoaffective disorder, unspecified: Secondary | ICD-10-CM | POA: Insufficient documentation

## 2019-05-27 DIAGNOSIS — D250 Submucous leiomyoma of uterus: Secondary | ICD-10-CM | POA: Insufficient documentation

## 2019-05-27 HISTORY — PX: VAGINAL HYSTERECTOMY: SHX2639

## 2019-05-27 LAB — TYPE AND SCREEN
ABO/RH(D): O POS
Antibody Screen: NEGATIVE

## 2019-05-27 LAB — RAPID URINE DRUG SCREEN, HOSP PERFORMED
Amphetamines: NOT DETECTED
Barbiturates: NOT DETECTED
Benzodiazepines: NOT DETECTED
Cocaine: NOT DETECTED
Opiates: NOT DETECTED
Tetrahydrocannabinol: NOT DETECTED

## 2019-05-27 LAB — PREGNANCY, URINE: Preg Test, Ur: NEGATIVE

## 2019-05-27 LAB — POCT PREGNANCY, URINE: Preg Test, Ur: NEGATIVE

## 2019-05-27 SURGERY — HYSTERECTOMY, VAGINAL
Anesthesia: General | Site: Vagina | Laterality: Bilateral

## 2019-05-27 MED ORDER — OXYCODONE-ACETAMINOPHEN 5-325 MG PO TABS
1.0000 | ORAL_TABLET | ORAL | Status: DC | PRN
  Administered 2019-05-27 (×2): 1 via ORAL
  Administered 2019-05-27 – 2019-05-28 (×3): 2 via ORAL

## 2019-05-27 MED ORDER — LACTATED RINGERS IV SOLN: INTRAVENOUS | Status: DC

## 2019-05-27 MED ORDER — ONDANSETRON HCL 4 MG/2ML IJ SOLN
INTRAMUSCULAR | Status: AC
  Filled 2019-05-27: qty 2

## 2019-05-27 MED ORDER — DEXMEDETOMIDINE HCL 200 MCG/2ML IV SOLN
INTRAVENOUS | Status: DC | PRN
  Administered 2019-05-27 (×3): 8 ug via INTRAVENOUS

## 2019-05-27 MED ORDER — MIDAZOLAM HCL 2 MG/2ML IJ SOLN
INTRAMUSCULAR | Status: AC
  Filled 2019-05-27: qty 2

## 2019-05-27 MED ORDER — ROCURONIUM BROMIDE 10 MG/ML (PF) SYRINGE
PREFILLED_SYRINGE | INTRAVENOUS | Status: AC
  Filled 2019-05-27: qty 10

## 2019-05-27 MED ORDER — DEXAMETHASONE SODIUM PHOSPHATE 10 MG/ML IJ SOLN
INTRAMUSCULAR | Status: DC | PRN
  Administered 2019-05-27 (×2): 5 mg via INTRAVENOUS

## 2019-05-27 MED ORDER — LIDOCAINE 2% (20 MG/ML) 5 ML SYRINGE
INTRAMUSCULAR | Status: AC
  Filled 2019-05-27: qty 5

## 2019-05-27 MED ORDER — OXYCODONE-ACETAMINOPHEN 5-325 MG PO TABS
ORAL_TABLET | ORAL | Status: AC
  Filled 2019-05-27: qty 2

## 2019-05-27 MED ORDER — OXYCODONE HCL 5 MG PO TABS: 5.0000 mg | ORAL_TABLET | Freq: Once | ORAL | Status: DC | PRN

## 2019-05-27 MED ORDER — FENTANYL CITRATE (PF) 100 MCG/2ML IJ SOLN
INTRAMUSCULAR | Status: AC
  Filled 2019-05-27: qty 2

## 2019-05-27 MED ORDER — CEFAZOLIN SODIUM-DEXTROSE 2-4 GM/100ML-% IV SOLN
2.0000 g | INTRAVENOUS | Status: AC
  Administered 2019-05-27: 2 g via INTRAVENOUS

## 2019-05-27 MED ORDER — FENTANYL CITRATE (PF) 100 MCG/2ML IJ SOLN
INTRAMUSCULAR | Status: DC | PRN
  Administered 2019-05-27: 25 ug via INTRAVENOUS
  Administered 2019-05-27 (×2): 50 ug via INTRAVENOUS
  Administered 2019-05-27: 25 ug via INTRAVENOUS

## 2019-05-27 MED ORDER — ONDANSETRON HCL 4 MG/2ML IJ SOLN: 4.0000 mg | Freq: Once | INTRAMUSCULAR | Status: DC | PRN

## 2019-05-27 MED ORDER — FENTANYL CITRATE (PF) 100 MCG/2ML IJ SOLN
25.0000 ug | INTRAMUSCULAR | Status: DC | PRN
  Administered 2019-05-27: 25 ug via INTRAVENOUS
  Administered 2019-05-27: 50 ug via INTRAVENOUS
  Administered 2019-05-27: 25 ug via INTRAVENOUS

## 2019-05-27 MED ORDER — MIDAZOLAM HCL 2 MG/2ML IJ SOLN
INTRAMUSCULAR | Status: DC | PRN
  Administered 2019-05-27: 2 mg via INTRAVENOUS

## 2019-05-27 MED ORDER — OXYCODONE HCL 5 MG/5ML PO SOLN: 5.0000 mg | Freq: Once | ORAL | Status: DC | PRN

## 2019-05-27 MED ORDER — ONDANSETRON HCL 4 MG/2ML IJ SOLN: 4.0000 mg | Freq: Four times a day (QID) | INTRAMUSCULAR | Status: DC | PRN

## 2019-05-27 MED ORDER — ROCURONIUM BROMIDE 10 MG/ML (PF) SYRINGE
PREFILLED_SYRINGE | INTRAVENOUS | Status: DC | PRN
  Administered 2019-05-27: 60 mg via INTRAVENOUS
  Administered 2019-05-27 (×2): 10 mg via INTRAVENOUS

## 2019-05-27 MED ORDER — HYDROMORPHONE HCL 1 MG/ML IJ SOLN
INTRAMUSCULAR | Status: AC
  Filled 2019-05-27: qty 1

## 2019-05-27 MED ORDER — HYDROMORPHONE HCL 1 MG/ML IJ SOLN
1.0000 mg | INTRAMUSCULAR | Status: DC | PRN
  Administered 2019-05-27: 0.5 mg via INTRAVENOUS

## 2019-05-27 MED ORDER — LIDOCAINE 2% (20 MG/ML) 5 ML SYRINGE
INTRAMUSCULAR | Status: DC | PRN
  Administered 2019-05-27: 100 mg via INTRAVENOUS

## 2019-05-27 MED ORDER — CEFAZOLIN SODIUM-DEXTROSE 2-4 GM/100ML-% IV SOLN
INTRAVENOUS | Status: AC
  Filled 2019-05-27: qty 100

## 2019-05-27 MED ORDER — ZOLPIDEM TARTRATE 5 MG PO TABS: 5.0000 mg | ORAL_TABLET | Freq: Every evening | ORAL | Status: DC | PRN

## 2019-05-27 MED ORDER — ONDANSETRON HCL 4 MG PO TABS: 4.0000 mg | ORAL_TABLET | Freq: Four times a day (QID) | ORAL | Status: DC | PRN

## 2019-05-27 MED ORDER — KETOROLAC TROMETHAMINE 30 MG/ML IJ SOLN
INTRAMUSCULAR | Status: DC | PRN
  Administered 2019-05-27: 30 mg via INTRAVENOUS

## 2019-05-27 MED ORDER — PROPOFOL 10 MG/ML IV BOLUS
INTRAVENOUS | Status: DC | PRN
  Administered 2019-05-27: 160 mg via INTRAVENOUS

## 2019-05-27 MED ORDER — SUGAMMADEX SODIUM 200 MG/2ML IV SOLN
INTRAVENOUS | Status: DC | PRN
  Administered 2019-05-27: 250 mg via INTRAVENOUS

## 2019-05-27 MED ORDER — ONDANSETRON HCL 4 MG/2ML IJ SOLN
INTRAMUSCULAR | Status: DC | PRN
  Administered 2019-05-27: 4 mg via INTRAVENOUS

## 2019-05-27 MED ORDER — DEXAMETHASONE SODIUM PHOSPHATE 10 MG/ML IJ SOLN
INTRAMUSCULAR | Status: AC
  Filled 2019-05-27: qty 1

## 2019-05-27 MED ORDER — LIDOCAINE-EPINEPHRINE (PF) 1 %-1:200000 IJ SOLN
INTRAMUSCULAR | Status: DC | PRN
  Administered 2019-05-27: 10 mL

## 2019-05-27 MED ORDER — KETOROLAC TROMETHAMINE 30 MG/ML IJ SOLN
INTRAMUSCULAR | Status: AC
  Filled 2019-05-27: qty 1

## 2019-05-27 SURGICAL SUPPLY — 26 items
COVER WAND RF STERILE (DRAPES) ×3 IMPLANT
GAUZE 4X4 16PLY RFD (DISPOSABLE) ×6 IMPLANT
GLOVE BIO SURGEON STRL SZ 6.5 (GLOVE) ×4 IMPLANT
GLOVE BIO SURGEON STRL SZ7 (GLOVE) ×6 IMPLANT
GLOVE BIO SURGEONS STRL SZ 6.5 (GLOVE) ×2
GLOVE BIOGEL PI IND STRL 6.5 (GLOVE) ×1 IMPLANT
GLOVE BIOGEL PI IND STRL 7.0 (GLOVE) ×3 IMPLANT
GLOVE BIOGEL PI IND STRL 7.5 (GLOVE) ×1 IMPLANT
GLOVE BIOGEL PI INDICATOR 6.5 (GLOVE) ×2
GLOVE BIOGEL PI INDICATOR 7.0 (GLOVE) ×6
GLOVE BIOGEL PI INDICATOR 7.5 (GLOVE) ×2
GLOVE ECLIPSE 6.0 STRL STRAW (GLOVE) ×3 IMPLANT
GOWN STRL REUS W/ TWL LRG LVL3 (GOWN DISPOSABLE) ×4 IMPLANT
GOWN STRL REUS W/TWL LRG LVL3 (GOWN DISPOSABLE) ×12
HIBICLENS CHG 4% 4OZ (MISCELLANEOUS) ×3 IMPLANT
KIT TURNOVER CYSTO (KITS) ×3 IMPLANT
NS IRRIG 1000ML POUR BTL (IV SOLUTION) ×3 IMPLANT
PACK VAGINAL WOMENS (CUSTOM PROCEDURE TRAY) ×3 IMPLANT
PAD OB MATERNITY 4.3X12.25 (PERSONAL CARE ITEMS) ×3 IMPLANT
SUT VIC AB 0 CT1 18XCR BRD8 (SUTURE) ×3 IMPLANT
SUT VIC AB 0 CT1 36 (SUTURE) ×3 IMPLANT
SUT VIC AB 0 CT1 8-18 (SUTURE) ×9
SUT VIC AB 2-0 CT1 27 (SUTURE) ×6
SUT VIC AB 2-0 CT1 TAPERPNT 27 (SUTURE) ×2 IMPLANT
SUT VICRYL 0 TIES 12 18 (SUTURE) ×3 IMPLANT
TRAY FOLEY W/BAG SLVR 14FR (SET/KITS/TRAYS/PACK) ×3 IMPLANT
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
## 2019-05-27 NOTE — Anesthesia Procedure Notes (Signed)
Procedure Name: Intubation Date/Time: 05/27/2019 1:01 PM Performed by: Suan Halter, CRNA Pre-anesthesia Checklist: Patient identified, Emergency Drugs available, Suction available and Patient being monitored Patient Re-evaluated:Patient Re-evaluated prior to induction Oxygen Delivery Method: Circle system utilized Preoxygenation: Pre-oxygenation with 100% oxygen Induction Type: IV induction Ventilation: Mask ventilation without difficulty Laryngoscope Size: Mac and 3 Grade View: Grade I Tube type: Oral Tube size: 7.0 mm Number of attempts: 1 Airway Equipment and Method: Stylet and Oral airway Placement Confirmation: ETT inserted through vocal cords under direct vision,  positive ETCO2 and breath sounds checked- equal and bilateral Secured at: 22 cm Tube secured with: Tape Dental Injury: Teeth and Oropharynx as per pre-operative assessment

## 2019-05-27 NOTE — Op Note (Signed)
Holly Mendez PROCEDURE DATE: 05/27/2019  PREOPERATIVE DIAGNOSIS:  Symptomatic fibroids, menorrhagia POSTOPERATIVE DIAGNOSIS:  Symptomatic fibroids, menorrhagia SURGEON:  Woodroe Mode, MD ASSISTANT:  Dr. Rhea Belton.  An experienced assistant was required given the standard of surgical care given the complexity of the case.  This assistant was needed for exposure, dissection, suctioning, retraction, instrument exchange, and for overall help during the procedure. OPERATION:  Total Vaginal hysterectomy, right salpingectomy ANESTHESIA:  General endotracheal.  INDICATIONS: The patient is a 44 y.o. [id number] with history of symptomatic uterine fibroids/menorrhagia. The patient made a decision to undergo definite surgical treatment. On the preoperative visit, the risks, benefits, indications, and alternatives of the procedure were reviewed with the patient.  On the day of surgery, the risks of surgery were again discussed with the patient including but not limited to: bleeding which may require transfusion or reoperation; infection which may require antibiotics; injury to bowel, bladder, ureters or other surrounding organs; need for additional procedures; thromboembolic phenomenon, incisional problems and other postoperative/anesthesia complications. Written informed consent was obtained.    OPERATIVE FINDINGS: A 6 week size uterus with normal tubes and ovaries bilaterally.  ESTIMATED BLOOD LOSS: 200 ml FLUIDS:  1500 ml of Lactated Ringers URINE OUTPUT:  100 ml of clear yellow urine. SPECIMENS:  Uterus and cervix sent to pathology, right fallopian tube COMPLICATIONS:  None immediate.  DESCRIPTION OF PROCEDURE:  The patient received intravenous antibiotics and had sequential compression devices applied to her lower extremities while in the preoperative area.  She was then taken to the operating room where general anesthesia was administered and was found to be adequate.  She was placed in the  dorsal lithotomy position, and was prepped and draped in a sterile manner.  A Foley catheter was inserted into her bladder and attached to constant drainage. After an adequate timeout was performed, attention was turned to her pelvis.  A weighted speculum was then placed in the vagina, and the anterior and posterior lips of the cervix were grasped bilaterally with tenaculums.  The cervix was then injected circumferentially with 1% lidocaine with epinephrine solution to maintain hemostasis.  The cervix was then circumferentially incised, and the bladder was dissected off the pubocervical fascia anteriorly without complication.  The anterior cul-de-sac was then entered sharply without difficulty and a retractor was placed.  The same procedure was performed posteriorly and the posterior cul-de-sac was entered sharply without difficulty.  A long weighted speculum was inserted into the posterior cul-de-sac.  The Heaney clamp was then used to clamp the uterosacral ligaments on either side.  They were then cut and sutured ligated with 0 Vicryl, and the ligated uterosacral ligaments were transfixed to the ipsilateral vaginal epithelium to further support the vagina and provide hemostasis. Of note, all sutures used in this case were 0 Vicryl unless otherwise noted.   The cardinal ligaments were then clamped, cut and ligated. The uterine vessels and broad ligaments were then serially clamped with the Heaney clamps, cut, and suture ligated on both sides.  Excellent hemostasis was noted at this point.  The uterus was then delivered via the posterior cul-de-sac, and the cornua were clamped with the Heaney clamps, transected, and the uterus was delivered and sent to pathology. These pedicles were then suture ligated to ensure hemostasis.  After completion of the hysterectomy, all pedicles from the uterosacral ligament to the cornua were examined hemostasis was confirmed.  The left fallopian tube was not identified, and the right  distal portion was clamped, excised and  suture ligated.The vaginal cuff was then closed with a in a running locked fashion with care given to incorporate the uterosacral pedicles bilaterally.  All instruments were then removed from the pelvis.  The patient tolerated the procedure well.  All instruments, needles, and sponge counts were correct x 3. The patient was taken to the recovery room in stable condition.    Woodroe Mode, MD [place] 05/27/2019 3:02 PM

## 2019-05-27 NOTE — Anesthesia Preprocedure Evaluation (Addendum)
Anesthesia Evaluation  Patient identified by MRN, date of birth, ID band Patient awake    Reviewed: Allergy & Precautions, NPO status , Patient's Chart, lab work & pertinent test results  History of Anesthesia Complications Negative for: history of anesthetic complications  Airway Mallampati: II  TM Distance: >3 FB Neck ROM: Full    Dental  (+) Teeth Intact   Pulmonary asthma , Current Smoker and Patient abstained from smoking.,    Pulmonary exam normal        Cardiovascular hypertension, negative cardio ROS Normal cardiovascular exam     Neuro/Psych PSYCHIATRIC DISORDERS Anxiety Depression Bipolar Disorder Schizophrenia negative neurological ROS     GI/Hepatic Neg liver ROS, GERD  ,  Endo/Other  Morbid obesity  Renal/GU negative Renal ROS  negative genitourinary   Musculoskeletal  (+) Arthritis , Fibromyalgia -  Abdominal   Peds  Hematology negative hematology ROS (+)   Anesthesia Other Findings   Reproductive/Obstetrics Uterine fibroids                            Anesthesia Physical Anesthesia Plan  ASA: III  Anesthesia Plan: General   Post-op Pain Management:    Induction: Intravenous  PONV Risk Score and Plan: 3 and Ondansetron, Dexamethasone, Treatment may vary due to age or medical condition and Midazolam  Airway Management Planned: Oral ETT  Additional Equipment: None  Intra-op Plan:   Post-operative Plan: Extubation in OR  Informed Consent: I have reviewed the patients History and Physical, chart, labs and discussed the procedure including the risks, benefits and alternatives for the proposed anesthesia with the patient or authorized representative who has indicated his/her understanding and acceptance.     Dental advisory given  Plan Discussed with:   Anesthesia Plan Comments:         Anesthesia Quick Evaluation

## 2019-05-27 NOTE — Transfer of Care (Signed)
Immediate Anesthesia Transfer of Care Note  Patient: Trameka Kooi Brown-Melick  Procedure(s) Performed: Procedure(s) (LRB): HYSTERECTOMY VAGINAL WITH SALPINGECTOMY (Bilateral)  Patient Location: PACU  Anesthesia Type: General  Level of Consciousness: awake, oriented, sedated and patient cooperative  Airway & Oxygen Therapy: Patient Spontanous Breathing and Patient connected to face mask oxygen  Post-op Assessment: Report given to PACU RN and Post -op Vital signs reviewed and stable  Post vital signs: Reviewed and stable  Complications: No apparent anesthesia complications Last Vitals:  Vitals Value Taken Time  BP 114/71 05/27/19 1515  Temp    Pulse 98 05/27/19 1516  Resp 20 05/27/19 1516  SpO2 100 % 05/27/19 1516  Vitals shown include unvalidated device data.  Last Pain:  Vitals:   05/27/19 1141  TempSrc: Oral

## 2019-05-27 NOTE — Anesthesia Postprocedure Evaluation (Signed)
Anesthesia Post Note  Patient: Holly Mendez  Procedure(s) Performed: HYSTERECTOMY VAGINAL WITH SALPINGECTOMY (Bilateral Vagina )     Patient location during evaluation: PACU Anesthesia Type: General Level of consciousness: awake and alert Pain management: pain level controlled Vital Signs Assessment: post-procedure vital signs reviewed and stable Respiratory status: spontaneous breathing, nonlabored ventilation, respiratory function stable and patient connected to nasal cannula oxygen Cardiovascular status: blood pressure returned to baseline and stable Postop Assessment: no apparent nausea or vomiting Anesthetic complications: no    Last Vitals:  Vitals:   05/27/19 1651 05/27/19 1815  BP: (!) 147/89 (!) 109/98  Pulse: 96 (!) 102  Resp: 20 20  Temp: 36.7 C 37.3 C  SpO2: 96% 100%    Last Pain:  Vitals:   05/27/19 1815  TempSrc:   PainSc: 3                  [name] S

## 2019-05-28 DIAGNOSIS — D250 Submucous leiomyoma of uterus: Secondary | ICD-10-CM | POA: Diagnosis not present

## 2019-05-28 LAB — CBC
HCT: 32.8 % — ABNORMAL LOW (ref 36.0–46.0)
Hemoglobin: 10.9 g/dL — ABNORMAL LOW (ref 12.0–15.0)
MCH: 28 pg (ref 26.0–34.0)
MCHC: 33.2 g/dL (ref 30.0–36.0)
MCV: 84.3 fL (ref 80.0–100.0)
Platelets: 252 10*3/uL (ref 150–400)
RBC: 3.89 MIL/uL (ref 3.87–5.11)
RDW: 14.8 % (ref 11.5–15.5)
WBC: 7.9 10*3/uL (ref 4.0–10.5)
nRBC: 0 % (ref 0.0–0.2)

## 2019-05-28 MED ORDER — OXYCODONE-ACETAMINOPHEN 5-325 MG PO TABS
ORAL_TABLET | ORAL | Status: AC
  Filled 2019-05-28: qty 2

## 2019-05-28 MED ORDER — OXYCODONE-ACETAMINOPHEN 5-325 MG PO TABS: 1.0000 | ORAL_TABLET | ORAL | 0 refills | Status: DC | PRN

## 2019-05-28 MED FILL — OXYCODONE-APAP 5-325MG: 5-325 | 3 days supply | Qty: 30 | Fill #0

## 2019-05-28 NOTE — Discharge Summary (Signed)
Gynecology Physician Postoperative Discharge Summary  Patient ID: Holly Mendez MRN: [id number] DOB/AGE: October 21, 1975 44 y.o.  Admit Date: 05/27/2019 Discharge Date: 05/28/2019  Preoperative Diagnoses: Fibroid uterus and menometrorrhagia  Procedures: Procedure(s) (LRB): HYSTERECTOMY VAGINAL WITH SALPINGECTOMY (Bilateral)  Hospital Course:  Holly Mendez is a 44 y.o. [id number]  admitted for scheduled surgery.  She underwent the procedures as mentioned above, her operation was uncomplicated. For further details about surgery, please refer to the operative report. Patient had an uncomplicated postoperative course. By time of discharge on POD#1, her pain was controlled on oral pain medications; she was ambulating, voiding without difficulty, tolerating regular diet and passing flatus. She was deemed stable for discharge to home.   Significant Labs: CBC Latest Ref Rng & Units 05/28/2019 05/20/2019 03/03/2019  WBC 4.0 - 10.5 K/uL 7.9 8.1 8.0  Hemoglobin 12.0 - 15.0 g/dL 10.9(L) 12.8 13.7  Hematocrit 36.0 - 46.0 % 32.8(L) 39.6 40.2  Platelets 150 - 400 K/uL 252 302 374    Discharge Exam: Blood pressure (!) 127/98, pulse (!) 103, temperature 98.5 F (36.9 C), resp. rate 16, height _0  (1.651 m), weight 109 kg, SpO2 100 %. General appearance: alert and no distress  Resp: clear to auscultation bilaterally  Cardio: regular rate and rhythm  GI: soft, non-tender; bowel sounds normal; no masses, no organomegaly.  Incision: C/D/I, no erythema, no drainage noted Pelvic: scant blood on pad  Extremities: extremities normal, atraumatic, no cyanosis or edema and Homans sign is negative, no sign of DVT  Discharged Condition: Stable  Disposition: Discharge disposition: 01-Home or Self Care       Discharge Instructions    Discharge patient   Complete by: As directed    Discharge disposition: 01-Home or Self Care   Discharge patient date: 05/28/2019     Allergies as of  05/28/2019      Reactions   Lyrica [pregabalin] Swelling   Swelling of hands and feet   Naproxen Swelling   Norvasc [amlodipine Besylate] Other (See Comments)   Mouth irritation    Zithromax [azithromycin] Other (See Comments)   Severe stomach cramping      Medication List    STOP taking these medications   megestrol 40 MG tablet Commonly known as: MEGACE     TAKE these medications   albuterol 108 (90 Base) MCG/ACT inhaler Commonly known as: VENTOLIN HFA INHALE TWO PUFFS INTO THE LUNGS EVERY 6 HOURS AS NEEDED WHEEZING OR SHORTNESS OF BREATH What changed: See the new instructions.   amitriptyline 25 MG tablet Commonly known as: ELAVIL TAKE THREE TABLETS BY MOUTH AT BEDTIME   budesonide-formoterol 80-4.5 MCG/ACT inhaler Commonly known as: Symbicort INHALE TWO PUFFS INTO THE LUNGS TWICE DAILY What changed:   how much to take  how to take this  when to take this  reasons to take this  additional instructions   Catapres-TTS-1 0.1 mg/24hr patch Generic drug: cloNIDine Place 1 patch (0.1 mg total) onto the skin once a week. What changed: See the new instructions.   cetirizine 10 MG tablet Commonly known as: ZYRTEC Take 1 tablet (10 mg total) by mouth daily. What changed:   when to take this  reasons to take this   esomeprazole 40 MG capsule Commonly known as: NEXIUM Take 1 capsule (40 mg total) by mouth daily.   hydrOXYzine 25 MG tablet Commonly known as: ATARAX/VISTARIL Take 25 mg by mouth every 8 (eight) hours as needed for anxiety.   Hyoscyamine Sulfate SL 0.125 MG Subl Commonly known  as: Levsin/SL Place 0.125 mg under the tongue every 6 (six) hours as needed (pain).   Latuda 80 MG Tabs tablet Generic drug: lurasidone Take 80 mg by mouth daily.   metroNIDAZOLE 500 MG tablet Commonly known as: FLAGYL Take one by mouth twice a day for 7 days What changed: See the new instructions.   Narcan 4 MG/0.1ML Liqd nasal spray kit Generic drug:  naloxone Place 1 spray into the nose as needed (overdose).   ondansetron 4 MG disintegrating tablet Commonly known as: Zofran ODT 63m ODT q8 hours prn nausea/vomit   oxyCODONE-acetaminophen 5-325 MG tablet Commonly known as: PERCOCET/ROXICET Take 1-2 tablets by mouth every 4 (four) hours as needed for moderate pain. Notes to patient: May take next dose at 10:30 am   polyethylene glycol powder 17 GM/SCOOP powder Commonly known as: GLYCOLAX/MIRALAX Take one or two packets daily by mouth as directed for constipation   Promethegan 25 MG suppository Generic drug: promethazine Place 1/2 suppository (12.5 mg total) rectally every 12 (twelve) hours as needed for nausea or vomiting. What changed: See the new instructions.   sucralfate 1 g tablet Commonly known as: CARAFATE Take 1 g by mouth 4 (four) times daily.   Vitamin D (Ergocalciferol) 1.25 MG (50000 UNIT) Caps capsule Commonly known as: DRISDOL Take 50,000 Units by mouth 2 (two) times a week.      [facility].   Specialty: Obstetrics and Gynecology Contact information: [phone]         Signed: AWoodroe Mode MD, [place]

## 2019-05-28 NOTE — Progress Notes (Signed)
Dr. Roselie Awkward called and updated pt voided 50 cc clear yellow urine and voided again 70 cc and pt feels she is empty.   Dr. Roselie Awkward said that is fine she can go home.

## 2019-05-28 NOTE — Discharge Instructions (Signed)
Vaginal Hysterectomy, Care After Refer to this sheet in the next few weeks. These instructions provide you with information about caring for yourself after your procedure. Your health care provider may also give you more specific instructions. Your treatment has been planned according to current medical practices, but problems sometimes occur. Call your health care provider if you have any problems or questions after your procedure. What can I expect after the procedure? After the procedure, it is common to have:  Pain.  Soreness and numbness in your incision areas.  Vaginal bleeding and discharge.  Constipation.  Temporary problems emptying the bladder.  Feelings of sadness or other emotions. Follow these instructions at home: Medicines  Take over-the-counter and prescription medicines only as told by your health care provider.  If you were prescribed an antibiotic medicine, take it as told by your health care provider. Do not stop taking the antibiotic even if you start to feel better.  Do not drive or operate heavy machinery while taking prescription pain medicine. Activity  Return to your normal activities as told by your health care provider. Ask your health care provider what activities are safe for you.  Get regular exercise as told by your health care provider. You may be told to take short walks every day and go farther each time.  Do not lift anything that is heavier than 10 lb (4.5 kg). General instructions   Do not put anything in your vagina for 6 weeks after your surgery or as told by your health care provider. This includes tampons and douches.  Do not have sex until your health care provider says you can.  Do not take baths, swim, or use a hot tub until your health care provider approves.  Drink enough fluid to keep your urine clear or pale yellow.  Do not drive for 24 hours if you were given a sedative.  Keep all follow-up visits as told by your health  care provider. This is important. Contact a health care provider if:  Your pain medicine is not helping.  You have a fever.  You have redness, swelling, or pain at your incision site.  You have blood, pus, or a bad-smelling discharge from your vagina.  You continue to have difficulty urinating. Get help right away if:  You have severe abdominal or back pain.  You have heavy bleeding from your vagina.  You have chest pain or shortness of breath. This information is not intended to replace advice given to you by your health care provider. Make sure you discuss any questions you have with your health care provider. Document Revised: 08/19/2015 Document Reviewed: 01/10/2015 Elsevier Patient Education  2020 [employer]  

## 2019-05-29 ENCOUNTER — Telehealth (INDEPENDENT_AMBULATORY_CARE_PROVIDER_SITE_OTHER): Payer: Medicaid Other | Admitting: Obstetrics & Gynecology

## 2019-05-29 DIAGNOSIS — Z0189 Encounter for other specified special examinations: Secondary | ICD-10-CM

## 2019-05-29 LAB — SURGICAL PATHOLOGY

## 2019-05-29 NOTE — Telephone Encounter (Signed)
Patient called in stating that she needs to speak to someone about increasing her pain because she is still in a lot of pain for surgery. Patient stated that she is unable to take tylenol or ibuprofen. Patient also stated that she does not want the medications sent to [facility] but instead her regular pharmacy. Patient instead that a message will be sent to the clinical pool and they will reach out to her as soon as they can. Patient verbalized understanding and message sent to clinical pool.

## 2019-05-29 NOTE — Telephone Encounter (Signed)
Patient called back into front office requesting to speak with a nurse. Patient states the pain medication Dr Roselie Awkward gave her isn't working and she needs a higher dose. Patient states she is taking one in the morning & one in the evening as the directions say twice a day. Patient states she cannot take NSAIDs per her doctor because it's bad for her kidneys. Discussed with patient she can actually take the Percocet 1-2 tablets every 4 hours as needed. Also discussed normal pain after surgery, constipation, & gas pain. Patient verbalized understanding.

## 2019-05-31 ENCOUNTER — Encounter: Payer: Self-pay | Admitting: Family Medicine

## 2019-05-31 DIAGNOSIS — Z90710 Acquired absence of both cervix and uterus: Secondary | ICD-10-CM | POA: Insufficient documentation

## 2019-06-02 ENCOUNTER — Telehealth (INDEPENDENT_AMBULATORY_CARE_PROVIDER_SITE_OTHER): Payer: Medicaid Other | Admitting: Obstetrics & Gynecology

## 2019-06-02 DIAGNOSIS — Z90710 Acquired absence of both cervix and uterus: Secondary | ICD-10-CM

## 2019-06-02 DIAGNOSIS — R1030 Lower abdominal pain, unspecified: Secondary | ICD-10-CM

## 2019-06-02 NOTE — Telephone Encounter (Signed)
Patient called in wanting to know if there was anything that can be done because she is still in a lot of pain from having surgery last week. Patient instructed that a message will be sent to the nurses. Patient verbalized understanding and message sent to clinical pool.

## 2019-06-03 NOTE — Telephone Encounter (Signed)
Patient reports she is cramping in her lower abdomen. She reports the pain is pretty severe.  She reports she has been really active when she got home and doing things she is not. She is resting more now.    She reports she has had bowel movements since being home and they are soft and painless.   Called patient and she reports she is out of Oxycodone. She reports Hydrocodone and Oxycodone usually help her.   She has been told to not take Tylenol or Ibuprofen as her kidney and liver function were effected. She indicated that she was also told not to take aspirin products due to bleeding history.   Patient would like to know what she can take. Informed her I will speak with a provider and call her back. Patient voiced understanding.

## 2019-06-03 NOTE — Telephone Encounter (Signed)
Routed to Dr. Roselie Awkward for advisement.

## 2019-06-04 ENCOUNTER — Other Ambulatory Visit: Payer: Self-pay | Admitting: Obstetrics & Gynecology

## 2019-06-04 ENCOUNTER — Telehealth: Payer: Self-pay | Admitting: Obstetrics & Gynecology

## 2019-06-04 DIAGNOSIS — N920 Excessive and frequent menstruation with regular cycle: Secondary | ICD-10-CM

## 2019-06-04 NOTE — Telephone Encounter (Signed)
Patient had surgery, need Pain meds

## 2019-06-04 NOTE — Telephone Encounter (Signed)
Called Dr Roselie Awkward who states he will not refill percocet but will send in an alternative to patient's pharmacy. Called & informed patient. Patient verbalized understanding & had no questions.

## 2019-06-04 NOTE — Telephone Encounter (Signed)
Spoke with patient this morning, she has called again for pain medication. Awaiting for Dr. Alonna Minium response.

## 2019-06-05 ENCOUNTER — Other Ambulatory Visit: Payer: Self-pay | Admitting: Family Medicine

## 2019-06-05 ENCOUNTER — Telehealth: Payer: Self-pay | Admitting: Obstetrics & Gynecology

## 2019-06-05 DIAGNOSIS — Z90710 Acquired absence of both cervix and uterus: Secondary | ICD-10-CM

## 2019-06-05 MED ORDER — TRAMADOL HCL 50 MG PO TABS: ORAL_TABLET | ORAL | 0 refills | Status: DC

## 2019-06-05 NOTE — Addendum Note (Signed)
Addended by: Langston Reusing on: 06/05/2019 10:29 AM   Modules accepted: Orders

## 2019-06-05 NOTE — Telephone Encounter (Signed)
Consult completed w/Dr. Roselie Awkward regarding this situation. I then called pt and informed her that Dr. Roselie Awkward had attempted to e-prescribe her pain medication yesterday and was having difficulties. I have called in the prescription to the [facility] and she may pick it up today. Pt was advised that this will be the last narcotic pain medication prescription given for her post op pain. Pt was also advised to try taking one Tylenol tablet with the Tramadol so that she may only need one Tramadol at a time instead on 2. Pt voiced understanding.

## 2019-06-05 NOTE — Telephone Encounter (Signed)
Patient called in stating that a nurse had called her yesterday and stated that they could not fill the oxycodone prescription but they would send a different prescription for her pain. Patient stated that the new prescription was sent to the [facility]. Patient that she called all day yesterday up until 6 and they said they never received the prescription. Patient instructed that a nurse will give her a call back in regards to this matter. Patient verbalized understanding.

## 2019-06-06 ENCOUNTER — Telehealth: Payer: Self-pay | Admitting: Obstetrics & Gynecology

## 2019-06-06 NOTE — Telephone Encounter (Signed)
Patient scheduled postop appointment with Dr Roselie Awkward for his next available appointment. She was okay with it being that far, but I wanted to make sure she would be okay and not need to talk to a nurse.

## 2019-06-11 NOTE — Telephone Encounter (Signed)
I called pt and informed her that the appt date of 6/24 will be fine. If she has questions or concerns or feels she needs to be sooner, she should contact our office. Pt voiced understanding.

## 2019-06-18 ENCOUNTER — Ambulatory Visit (INDEPENDENT_AMBULATORY_CARE_PROVIDER_SITE_OTHER): Payer: Medicaid Other | Admitting: Family Medicine

## 2019-06-18 ENCOUNTER — Other Ambulatory Visit: Payer: Self-pay | Admitting: Family Medicine

## 2019-06-18 ENCOUNTER — Encounter: Payer: Self-pay | Admitting: Family Medicine

## 2019-06-18 ENCOUNTER — Other Ambulatory Visit: Payer: Self-pay

## 2019-06-18 VITALS — BP 114/70 | HR 93 | Ht 65.0 in | Wt 246.2 lb

## 2019-06-18 DIAGNOSIS — M79609 Pain in unspecified limb: Secondary | ICD-10-CM

## 2019-06-18 MED ORDER — BACLOFEN 10 MG PO TABS: 10.0000 mg | ORAL_TABLET | Freq: Three times a day (TID) | ORAL | 0 refills | Status: DC

## 2019-06-18 NOTE — Progress Notes (Signed)
    CHIEF COMPLAINT / HPI:   Recent surgery/hysterectomy for dysfunctional uterine bleeding.  Her husband has been in the hospital the last few days and she has had a lot of things to do around the house such as lifting, mowing the grass etc.  She thinks she is overdone it and she is having a lot of lower abdominal pain that seems to be muscular in nature.  They have given her some pain medications for immediately after the surgery and she was doing well for several days but thinks she is overdone it.  Pain is diffuse in her lower belly, not crampy.  No blood in her stool.  Normal urination.  No pain with urination or defecation.  PERTINENT  PMH / PSH: I have reviewed the patient's medications, allergies, past medical and surgical history, smoking status and updated in the EMR as appropriate.   OBJECTIVE:  BP 114/70   Pulse 93   Ht 5\' 5"  (1.651 m)   Wt 246 lb 3.2 oz (111.7 kg)   SpO2 99%   BMI 40.97 kg/m  ABDOMEN: Soft, positive bowel sounds without any unusual tenderness although she has mild tenderness over the surgical area.  No rebound. hips: Mild pain with resisted flexion hip flexors.  ASSESSMENT / PLAN: Assessment musculoskeletal pain.  I do think she probably overdid it coming home from a major surgery and then doing things like mowing the yard.  We will give her some muscle relaxers and follow-up as needed.  No problem-specific Assessment & Plan notes found for this encounter.   [name] MD

## 2019-06-26 ENCOUNTER — Other Ambulatory Visit: Payer: Self-pay | Admitting: Family Medicine

## 2019-07-03 ENCOUNTER — Other Ambulatory Visit: Payer: Self-pay

## 2019-07-03 ENCOUNTER — Encounter: Payer: Self-pay | Admitting: Obstetrics & Gynecology

## 2019-07-03 ENCOUNTER — Ambulatory Visit (INDEPENDENT_AMBULATORY_CARE_PROVIDER_SITE_OTHER): Payer: Medicaid Other | Admitting: Obstetrics & Gynecology

## 2019-07-03 VITALS — BP 133/94 | HR 97 | Ht 65.0 in | Wt 253.6 lb

## 2019-07-03 DIAGNOSIS — Z09 Encounter for follow-up examination after completed treatment for conditions other than malignant neoplasm: Secondary | ICD-10-CM

## 2019-07-03 NOTE — Patient Instructions (Signed)
Vaginal Hysterectomy, Care After Refer to this sheet in the next few weeks. These instructions provide you with information about caring for yourself after your procedure. Your health care provider may also give you more specific instructions. Your treatment has been planned according to current medical practices, but problems sometimes occur. Call your health care provider if you have any problems or questions after your procedure. What can I expect after the procedure? After the procedure, it is common to have:  Pain.  Soreness and numbness in your incision areas.  Vaginal bleeding and discharge.  Constipation.  Temporary problems emptying the bladder.  Feelings of sadness or other emotions. Follow these instructions at home: Medicines  Take over-the-counter and prescription medicines only as told by your health care provider.  If you were prescribed an antibiotic medicine, take it as told by your health care provider. Do not stop taking the antibiotic even if you start to feel better.  Do not drive or operate heavy machinery while taking prescription pain medicine. Activity  Return to your normal activities as told by your health care provider. Ask your health care provider what activities are safe for you.  Get regular exercise as told by your health care provider. You may be told to take short walks every day and go farther each time.  Do not lift anything that is heavier than 10 lb (4.5 kg). General instructions   Do not put anything in your vagina for 6 weeks after your surgery or as told by your health care provider. This includes tampons and douches.  Do not have sex until your health care provider says you can.  Do not take baths, swim, or use a hot tub until your health care provider approves.  Drink enough fluid to keep your urine clear or pale yellow.  Do not drive for 24 hours if you were given a sedative.  Keep all follow-up visits as told by your health  care provider. This is important. Contact a health care provider if:  Your pain medicine is not helping.  You have a fever.  You have redness, swelling, or pain at your incision site.  You have blood, pus, or a bad-smelling discharge from your vagina.  You continue to have difficulty urinating. Get help right away if:  You have severe abdominal or back pain.  You have heavy bleeding from your vagina.  You have chest pain or shortness of breath. This information is not intended to replace advice given to you by your health care provider. Make sure you discuss any questions you have with your health care provider. Document Revised: 08/19/2015 Document Reviewed: 01/10/2015 Elsevier Patient Education  2020 [employer]  

## 2019-07-03 NOTE — Progress Notes (Signed)
Subjective:     Holly Mendez is a 44 y.o. female who presents to the clinic 5 weeks status post vaginal hysterectomy for fibroids. Eating a regular diet without difficulty. Bowel movements are normal. The patient is not having any pain.  The following portions of the patient's history were reviewed and updated as appropriate: allergies, current medications, past family history, past medical history, past social history, past surgical history and problem list.  Review of Systems Pertinent items are noted in HPI.    Objective:    BP (!) 133/94 (BP Location: Left Arm)    Pulse 97    Ht 5\' 5"  (1.651 m)    Wt 253 lb 9.6 oz (115 kg)    LMP 08/31/2018    BMI 42.20 kg/m  General:  alert, cooperative and no distress  Abdomen: soft, bowel sounds active, non-tender  Incision:   n/a     Assessment:    Doing well postoperatively. Operative findings again reviewed. Pathology report discussed.    Plan:    1. Continue any current medications. 2. Wound care discussed. 3. Activity restrictions: none 4. Anticipated return to work: now. 5. Follow up: Routine Gyn care after hysterectomy  Woodroe Mode, MD 07/03/2019

## 2019-07-08 ENCOUNTER — Telehealth: Payer: Self-pay

## 2019-07-08 ENCOUNTER — Other Ambulatory Visit: Payer: Self-pay | Admitting: Family Medicine

## 2019-07-08 NOTE — Telephone Encounter (Signed)
Patient calls nurse line regarding increased pain in hands and fingers in the evenings. Patient states that doctor has established that she has poor circulation and was unsure if this was the cause of her pain at nighttime. Patient states that she also has difficulties making a fist when she is experiencing this pain. Patient was evaluated June 6th for same issue. Patient has tried Voltaren cream with no relief.   Forwarding to PCP for next steps  Talbot Grumbling, RN

## 2019-07-09 ENCOUNTER — Other Ambulatory Visit: Payer: Self-pay | Admitting: Family Medicine

## 2019-07-09 NOTE — Telephone Encounter (Signed)
Dear [name] Team She should make an appointment. THANKS! [name]

## 2019-07-09 NOTE — Telephone Encounter (Signed)
Contacted pt and informed her of below and she said that the gel is helping.  She said she did not realize she could put it on her hand but she read in the directions you could and for now it is helping.Antoino Westhoff Zimmerman Rumple, CMA

## 2019-07-15 ENCOUNTER — Other Ambulatory Visit: Payer: Self-pay | Admitting: Family Medicine

## 2019-07-22 ENCOUNTER — Other Ambulatory Visit: Payer: Self-pay | Admitting: Family Medicine

## 2019-07-30 NOTE — Telephone Encounter (Signed)
Error. Licet Dunphy, CMA  

## 2019-08-06 ENCOUNTER — Telehealth: Payer: Self-pay

## 2019-08-06 DIAGNOSIS — Z202 Contact with and (suspected) exposure to infections with a predominantly sexual mode of transmission: Secondary | ICD-10-CM

## 2019-08-06 NOTE — Telephone Encounter (Signed)
Patient calls nurse line requesting flagyl refill. Patient states that she has been taking monthly, one pill daily for 7 days, due to ph imbalance. Patient states that she feels like she continues to need medication to prevent bad odor. Not on current medication list.   Forwarding to PCP for next steps.   Talbot Grumbling, RN

## 2019-08-07 NOTE — Telephone Encounter (Signed)
Patient calls nurse line to check status on receiving rx refill for flagyl. Patient states that she is supposed to be on medication monthly to prevent bad odor and vaginal discharge. I am unable to find supporting documentation.   Forwarding to PCP for next steps  Talbot Grumbling, RN

## 2019-08-12 MED ORDER — METRONIDAZOLE 500 MG PO TABS: 500.0000 mg | ORAL_TABLET | Freq: Two times a day (BID) | ORAL | 12 refills | Status: AC

## 2019-08-12 NOTE — Progress Notes (Signed)
Dear [name] Team I am sorry. I thoought I had already refilled it. I did it now [facility]! [name]

## 2019-08-12 NOTE — Telephone Encounter (Signed)
I have refilled it THANKS! [name]

## 2019-08-25 ENCOUNTER — Other Ambulatory Visit: Payer: Self-pay | Admitting: Family Medicine

## 2019-08-28 ENCOUNTER — Other Ambulatory Visit: Payer: Self-pay | Admitting: Family Medicine

## 2019-09-05 ENCOUNTER — Other Ambulatory Visit: Payer: Self-pay | Admitting: Family Medicine

## 2019-09-09 ENCOUNTER — Other Ambulatory Visit: Payer: Self-pay | Admitting: Family Medicine

## 2019-09-20 ENCOUNTER — Other Ambulatory Visit: Payer: Self-pay | Admitting: Family Medicine

## 2019-10-01 ENCOUNTER — Other Ambulatory Visit: Payer: Self-pay | Admitting: Family Medicine

## 2019-10-07 ENCOUNTER — Other Ambulatory Visit: Payer: Self-pay | Admitting: Family Medicine

## 2019-10-18 ENCOUNTER — Other Ambulatory Visit: Payer: Self-pay | Admitting: Family Medicine

## 2019-10-20 ENCOUNTER — Other Ambulatory Visit: Payer: Self-pay | Admitting: Family Medicine

## 2019-11-10 ENCOUNTER — Other Ambulatory Visit: Payer: Self-pay | Admitting: Family Medicine

## 2019-11-10 DIAGNOSIS — Z1231 Encounter for screening mammogram for malignant neoplasm of breast: Secondary | ICD-10-CM

## 2019-11-18 ENCOUNTER — Other Ambulatory Visit: Payer: Self-pay | Admitting: Family Medicine

## 2019-11-19 ENCOUNTER — Other Ambulatory Visit: Payer: Self-pay | Admitting: Family Medicine

## 2019-11-25 ENCOUNTER — Other Ambulatory Visit: Payer: Self-pay | Admitting: Family Medicine

## 2019-11-26 ENCOUNTER — Ambulatory Visit: Payer: Medicaid Other | Admitting: Family Medicine

## 2019-12-01 ENCOUNTER — Emergency Department (HOSPITAL_COMMUNITY): Payer: Medicaid Other

## 2019-12-01 ENCOUNTER — Encounter (HOSPITAL_COMMUNITY): Payer: Self-pay | Admitting: Family Medicine

## 2019-12-01 ENCOUNTER — Observation Stay (HOSPITAL_COMMUNITY)
Admission: EM | Admit: 2019-12-01 | Discharge: 2019-12-02 | Disposition: A | Discharge status: Home / Self Care | Payer: Medicaid Other | Attending: Family Medicine | Admitting: Family Medicine

## 2019-12-01 ENCOUNTER — Observation Stay (HOSPITAL_COMMUNITY): Payer: Medicaid Other

## 2019-12-01 ENCOUNTER — Other Ambulatory Visit: Payer: Self-pay

## 2019-12-01 DIAGNOSIS — I10 Essential (primary) hypertension: Secondary | ICD-10-CM | POA: Diagnosis not present

## 2019-12-01 DIAGNOSIS — K861 Other chronic pancreatitis: Secondary | ICD-10-CM | POA: Diagnosis not present

## 2019-12-01 DIAGNOSIS — R1013 Epigastric pain: Secondary | ICD-10-CM

## 2019-12-01 DIAGNOSIS — F1910 Other psychoactive substance abuse, uncomplicated: Secondary | ICD-10-CM | POA: Insufficient documentation

## 2019-12-01 DIAGNOSIS — K859 Acute pancreatitis without necrosis or infection, unspecified: Secondary | ICD-10-CM | POA: Diagnosis present

## 2019-12-01 DIAGNOSIS — R112 Nausea with vomiting, unspecified: Secondary | ICD-10-CM

## 2019-12-01 DIAGNOSIS — Z20822 Contact with and (suspected) exposure to covid-19: Secondary | ICD-10-CM | POA: Diagnosis not present

## 2019-12-01 DIAGNOSIS — F17210 Nicotine dependence, cigarettes, uncomplicated: Secondary | ICD-10-CM | POA: Diagnosis not present

## 2019-12-01 DIAGNOSIS — Z79899 Other long term (current) drug therapy: Secondary | ICD-10-CM | POA: Diagnosis not present

## 2019-12-01 DIAGNOSIS — R109 Unspecified abdominal pain: Secondary | ICD-10-CM | POA: Diagnosis present

## 2019-12-01 DIAGNOSIS — J45909 Unspecified asthma, uncomplicated: Secondary | ICD-10-CM | POA: Insufficient documentation

## 2019-12-01 LAB — COMPREHENSIVE METABOLIC PANEL
ALT: 38 U/L (ref 0–44)
AST: 34 U/L (ref 15–41)
Albumin: 3.7 g/dL (ref 3.5–5.0)
Alkaline Phosphatase: 107 U/L (ref 38–126)
Anion gap: 12 (ref 5–15)
BUN: 6 mg/dL (ref 6–20)
CO2: 24 mmol/L (ref 22–32)
Calcium: 9.1 mg/dL (ref 8.9–10.3)
Chloride: 103 mmol/L (ref 98–111)
Creatinine, Ser: 0.87 mg/dL (ref 0.44–1.00)
GFR, Estimated: >60 mL/min (ref >60)
Glucose, Bld: 134 mg/dL — ABNORMAL HIGH (ref 70–99)
Potassium: 3.5 mmol/L (ref 3.5–5.1)
Sodium: 139 mmol/L (ref 135–145)
Total Bilirubin: 0.8 mg/dL (ref 0.3–1.2)
Total Protein: 7.6 g/dL (ref 6.5–8.1)

## 2019-12-01 LAB — CBC WITH DIFFERENTIAL/PLATELET
Abs Immature Granulocytes: 0.03 10*3/uL (ref 0.00–0.07)
Basophils Absolute: 0 10*3/uL (ref 0.0–0.1)
Basophils Relative: 0 %
Eosinophils Absolute: 0 10*3/uL (ref 0.0–0.5)
Eosinophils Relative: 0 %
HCT: 38.9 % (ref 36.0–46.0)
Hemoglobin: 12.7 g/dL (ref 12.0–15.0)
Immature Granulocytes: 0 %
Lymphocytes Relative: 21 %
Lymphs Abs: 1.6 10*3/uL (ref 0.7–4.0)
MCH: 27.4 pg (ref 26.0–34.0)
MCHC: 32.6 g/dL (ref 30.0–36.0)
MCV: 84 fL (ref 80.0–100.0)
Monocytes Absolute: 0.4 10*3/uL (ref 0.1–1.0)
Monocytes Relative: 5 %
Neutro Abs: 5.5 10*3/uL (ref 1.7–7.7)
Neutrophils Relative %: 74 %
Platelets: 240 10*3/uL (ref 150–400)
RBC: 4.63 MIL/uL (ref 3.87–5.11)
RDW: 16.1 % — ABNORMAL HIGH (ref 11.5–15.5)
WBC: 7.4 10*3/uL (ref 4.0–10.5)
nRBC: 0 % (ref 0.0–0.2)

## 2019-12-01 LAB — RAPID URINE DRUG SCREEN, HOSP PERFORMED
Amphetamines: NOT DETECTED
Barbiturates: NOT DETECTED
Benzodiazepines: NOT DETECTED
Cocaine: POSITIVE — AB
Opiates: POSITIVE — AB
Tetrahydrocannabinol: NOT DETECTED

## 2019-12-01 LAB — URINALYSIS, ROUTINE W REFLEX MICROSCOPIC
Bilirubin Urine: NEGATIVE
Glucose, UA: NEGATIVE mg/dL
Hgb urine dipstick: NEGATIVE
Ketones, ur: 5 mg/dL — AB
Leukocytes,Ua: NEGATIVE
Nitrite: NEGATIVE
Protein, ur: NEGATIVE mg/dL
Specific Gravity, Urine: 1.027 (ref 1.005–1.030)
pH: 8 (ref 5.0–8.0)

## 2019-12-01 LAB — TROPONIN I (HIGH SENSITIVITY)
Troponin I (High Sensitivity): <2 ng/L (ref <18)
Troponin I (High Sensitivity): <2 ng/L (ref <18)

## 2019-12-01 LAB — LIPASE, BLOOD: Lipase: 21 U/L (ref 11–51)

## 2019-12-01 LAB — RESPIRATORY PANEL BY RT PCR (FLU A&B, COVID)
Influenza A by PCR: NEGATIVE
Influenza B by PCR: NEGATIVE
SARS Coronavirus 2 by RT PCR: NEGATIVE

## 2019-12-01 MED ORDER — ONDANSETRON HCL 4 MG/2ML IJ SOLN
4.0000 mg | Freq: Once | INTRAMUSCULAR | Status: AC
  Administered 2019-12-01: 4 mg via INTRAVENOUS
  Filled 2019-12-01: qty 2

## 2019-12-01 MED ORDER — PROMETHAZINE HCL 25 MG/ML IJ SOLN
12.5000 mg | Freq: Once | INTRAMUSCULAR | Status: AC
  Administered 2019-12-01: 12.5 mg via INTRAVENOUS
  Filled 2019-12-01: qty 1

## 2019-12-01 MED ORDER — AMITRIPTYLINE HCL 50 MG PO TABS
75.0000 mg | ORAL_TABLET | Freq: Every day | ORAL | Status: DC
  Administered 2019-12-01: 75 mg via ORAL
  Filled 2019-12-01: qty 2

## 2019-12-01 MED ORDER — SODIUM CHLORIDE 0.9 % IV BOLUS
1000.0000 mL | Freq: Once | INTRAVENOUS | Status: AC
  Administered 2019-12-01: 1000 mL via INTRAVENOUS

## 2019-12-01 MED ORDER — MORPHINE SULFATE (PF) 4 MG/ML IV SOLN: 4.0000 mg | Freq: Once | INTRAVENOUS | Status: AC

## 2019-12-01 MED ORDER — ONDANSETRON HCL 4 MG/2ML IJ SOLN
4.0000 mg | Freq: Four times a day (QID) | INTRAMUSCULAR | Status: DC | PRN
  Administered 2019-12-01 (×2): 4 mg via INTRAVENOUS
  Filled 2019-12-01 (×2): qty 2

## 2019-12-01 MED ORDER — HYDROMORPHONE HCL 1 MG/ML IJ SOLN
1.0000 mg | Freq: Once | INTRAMUSCULAR | Status: AC
  Administered 2019-12-01: 1 mg via INTRAVENOUS
  Filled 2019-12-01: qty 1

## 2019-12-01 MED ORDER — MORPHINE SULFATE (PF) 4 MG/ML IV SOLN: 4.0000 mg | Freq: Once | INTRAVENOUS | Status: DC

## 2019-12-01 MED ORDER — IOHEXOL 300 MG/ML  SOLN
100.0000 mL | Freq: Once | INTRAMUSCULAR | Status: AC | PRN
  Administered 2019-12-01: 100 mL via INTRAVENOUS

## 2019-12-01 MED ORDER — PANTOPRAZOLE SODIUM 40 MG IV SOLR
40.0000 mg | Freq: Once | INTRAVENOUS | Status: AC
  Administered 2019-12-01: 40 mg via INTRAVENOUS
  Filled 2019-12-01: qty 40

## 2019-12-01 MED ORDER — SUCRALFATE 1 G PO TABS
1.0000 g | ORAL_TABLET | Freq: Three times a day (TID) | ORAL | Status: DC
  Administered 2019-12-01 – 2019-12-02 (×3): 1 g via ORAL
  Filled 2019-12-01 (×6): qty 1

## 2019-12-01 MED ORDER — FAMOTIDINE 20 MG PO TABS
20.0000 mg | ORAL_TABLET | Freq: Two times a day (BID) | ORAL | Status: DC
  Administered 2019-12-01 – 2019-12-02 (×2): 20 mg via ORAL
  Filled 2019-12-01 (×2): qty 1

## 2019-12-01 MED ORDER — THIAMINE HCL 100 MG/ML IJ SOLN
100.0000 mg | Freq: Every day | INTRAMUSCULAR | Status: DC
  Administered 2019-12-01 – 2019-12-02 (×2): 100 mg via INTRAVENOUS
  Filled 2019-12-01 (×2): qty 2

## 2019-12-01 MED ORDER — ENOXAPARIN SODIUM 40 MG/0.4ML ~~LOC~~ SOLN
40.0000 mg | SUBCUTANEOUS | Status: DC
  Administered 2019-12-01: 40 mg via SUBCUTANEOUS
  Filled 2019-12-01: qty 0.4

## 2019-12-01 MED ORDER — HYDROXYZINE HCL 25 MG PO TABS
25.0000 mg | ORAL_TABLET | Freq: Two times a day (BID) | ORAL | Status: DC | PRN
  Administered 2019-12-01: 25 mg via ORAL
  Filled 2019-12-01: qty 1

## 2019-12-01 MED ORDER — MORPHINE SULFATE (PF) 2 MG/ML IV SOLN
2.0000 mg | INTRAVENOUS | Status: DC | PRN
  Administered 2019-12-01 – 2019-12-02 (×4): 2 mg via INTRAVENOUS
  Filled 2019-12-01 (×4): qty 1

## 2019-12-01 MED ORDER — HYDROMORPHONE HCL 1 MG/ML IJ SOLN
0.5000 mg | Freq: Once | INTRAMUSCULAR | Status: AC
  Administered 2019-12-01: 0.5 mg via INTRAVENOUS
  Filled 2019-12-01: qty 1

## 2019-12-01 MED ORDER — INFLUENZA VAC SPLIT QUAD 0.5 ML IM SUSY: 0.5000 mL | PREFILLED_SYRINGE | INTRAMUSCULAR | Status: DC

## 2019-12-01 MED ORDER — LURASIDONE HCL 40 MG PO TABS
80.0000 mg | ORAL_TABLET | Freq: Every day | ORAL | Status: DC
  Filled 2019-12-01 (×2): qty 2

## 2019-12-01 MED ORDER — LACTATED RINGERS IV SOLN: INTRAVENOUS | Status: DC

## 2019-12-01 MED ORDER — MORPHINE SULFATE (PF) 4 MG/ML IV SOLN
INTRAVENOUS | Status: AC
  Administered 2019-12-01: 4 mg via INTRAVENOUS
  Filled 2019-12-01: qty 1

## 2019-12-01 NOTE — H&P (Signed)
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 bladder distention. Electronically Signed   By: PRicky StabsD.  On: 12/01/2019 13:26   DG Abdomen Acute W/Chest  Result Date: 12/01/2019 CLINICAL DATA:  Pain and nausea EXAM: DG ABDOMEN ACUTE WITH 1 VIEW CHEST COMPARISON:  Chest radiograph February 26, 2019; abdomen radiographs as well as CT abdomen and pelvis May 26, 2019 FINDINGS: PA chest: Lungs are clear. Heart is upper normal in size with pulmonary vascularity normal. No adenopathy. Supine and upright abdomen: There is moderate stool in the colon. There is no bowel dilatation or air-fluid level to suggest bowel obstruction. No free air. Probable tiny phlebolith right mid pelvis. IMPRESSION: Moderate stool in colon. No bowel obstruction or free air. No edema or airspace opacity. Heart upper  normal in size. Electronically Signed   By: Lowella Grip III M.D.   On: 12/01/2019 11:30    EKG Interpretation  Date/Time:  Monday December 01 2019 10:41:46 EST Ventricular Rate:  86 PR Interval:    QRS Duration: 83 QT Interval:  388 QTC Calculation: 465 R Axis:   60 Text Interpretation: Sinus rhythm Probable left atrial enlargement since last tracing no significant change Confirmed by Noemi Chapel [phone]) on 12/01/2019 10:46:38 AM Also confirmed by Noemi Chapel [id number]), editor Hattie Perch ([id number])  on 12/01/2019 2:28:44 PM        Wilber Oliphant, M.D. 12/01/2019, 6:14 PM PGY-3, [facility] pager: [phone], text pages welcome

## 2019-12-01 NOTE — ED Notes (Signed)
Next of kin updated

## 2019-12-01 NOTE — Progress Notes (Signed)
FPTS Interim Progress Note  Nurse indicates patient have breakthrough abdominal pain.  Last dose of IV morphine q4hr at 7:30.  Came up to see patient and she is in shower.  Indicated could go ahead and give next dose when patient comes out.  Delora Fuel, MD 12/01/2019, 11:02 PM PGY-1, [facility] pager [phone]

## 2019-12-01 NOTE — ED Notes (Signed)
Pt requesting more pain and nausea medications, no orders available. Paging admitting provider at this time

## 2019-12-01 NOTE — ED Notes (Signed)
Patient transported to X-ray 
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
## 2019-12-01 NOTE — ED Notes (Signed)
Pt diaphoretic, tearful, and shaking.

## 2019-12-01 NOTE — ED Provider Notes (Signed)
Patient arrives by ambulance, complains of 3 days of abdominal pain, points diffusely to the abdomen.  She is nauseated, she is diaphoretic, she has mild diffuse abdominal tenderness but no guarding or peritoneal signs  We will rule out rupture of stomach ulcer with upright chest x-ray, labs and pain medication to get her more comfortable.  Patient is agreeable to that plan.  Medical screening examination/treatment/procedure(s) were conducted as a shared visit with non-physician practitioner(s) and myself.  I personally evaluated the patient during the encounter.  Clinical Impression:   Final diagnoses:  Epigastric pain  Intractable vomiting with nausea, unspecified vomiting type  Abdominal pain         Noemi Chapel, MD 12/02/19 [phone]

## 2019-12-01 NOTE — ED Triage Notes (Addendum)
Pt arrived via [facility], CC pancreatitis flare up. History of pancreatitis, pain started few days ago, increasing today, unable to keep medication down. N/V. Diaphoretic and tachy for EMS.

## 2019-12-01 NOTE — ED Notes (Signed)
Pt back in room.

## 2019-12-01 NOTE — ED Notes (Signed)
Patient transported to CT 

## 2019-12-02 LAB — COMPREHENSIVE METABOLIC PANEL
ALT: 32 U/L (ref 0–44)
AST: 26 U/L (ref 15–41)
Albumin: 3.6 g/dL (ref 3.5–5.0)
Alkaline Phosphatase: 99 U/L (ref 38–126)
Anion gap: 11 (ref 5–15)
BUN: 7 mg/dL (ref 6–20)
CO2: 26 mmol/L (ref 22–32)
Calcium: 9.2 mg/dL (ref 8.9–10.3)
Chloride: 101 mmol/L (ref 98–111)
Creatinine, Ser: 1.42 mg/dL — ABNORMAL HIGH (ref 0.44–1.00)
GFR, Estimated: 46 mL/min — ABNORMAL LOW (ref >60)
Glucose, Bld: 104 mg/dL — ABNORMAL HIGH (ref 70–99)
Potassium: 3.6 mmol/L (ref 3.5–5.1)
Sodium: 138 mmol/L (ref 135–145)
Total Bilirubin: 1 mg/dL (ref 0.3–1.2)
Total Protein: 7.3 g/dL (ref 6.5–8.1)

## 2019-12-02 LAB — CBC WITH DIFFERENTIAL/PLATELET
Abs Immature Granulocytes: 0.04 10*3/uL (ref 0.00–0.07)
Basophils Absolute: 0 10*3/uL (ref 0.0–0.1)
Basophils Relative: 0 %
Eosinophils Absolute: 0 10*3/uL (ref 0.0–0.5)
Eosinophils Relative: 0 %
HCT: 37.8 % (ref 36.0–46.0)
Hemoglobin: 12.5 g/dL (ref 12.0–15.0)
Immature Granulocytes: 0 %
Lymphocytes Relative: 23 %
Lymphs Abs: 2.8 10*3/uL (ref 0.7–4.0)
MCH: 27.2 pg (ref 26.0–34.0)
MCHC: 33.1 g/dL (ref 30.0–36.0)
MCV: 82.4 fL (ref 80.0–100.0)
Monocytes Absolute: 1.1 10*3/uL — ABNORMAL HIGH (ref 0.1–1.0)
Monocytes Relative: 9 %
Neutro Abs: 8 10*3/uL — ABNORMAL HIGH (ref 1.7–7.7)
Neutrophils Relative %: 68 %
Platelets: 296 10*3/uL (ref 150–400)
RBC: 4.59 MIL/uL (ref 3.87–5.11)
RDW: 16 % — ABNORMAL HIGH (ref 11.5–15.5)
WBC: 12 10*3/uL — ABNORMAL HIGH (ref 4.0–10.5)
nRBC: 0 % (ref 0.0–0.2)

## 2019-12-02 LAB — ETHANOL: Alcohol, Ethyl (B): <10 mg/dL (ref <10)

## 2019-12-02 LAB — LIPASE, BLOOD: Lipase: 20 U/L (ref 11–51)

## 2019-12-02 LAB — HIV ANTIBODY (ROUTINE TESTING W REFLEX): HIV Screen 4th Generation wRfx: NONREACTIVE

## 2019-12-02 MED ORDER — HYDROMORPHONE HCL 1 MG/ML IJ SOLN
0.5000 mg | Freq: Once | INTRAMUSCULAR | Status: AC
  Administered 2019-12-02: 0.5 mg via INTRAVENOUS
  Filled 2019-12-02: qty 0.5

## 2019-12-02 MED ORDER — ENOXAPARIN SODIUM 60 MG/0.6ML ~~LOC~~ SOLN: 60.0000 mg | SUBCUTANEOUS | Status: DC

## 2019-12-02 NOTE — Discharge Summary (Addendum)
[facility] Discharge Summary  Patient name: Holly Mendez Medical record number: [id number] Date of birth: January 26, 1975 Age: 44 y.o. Gender: female Date of Admission: 12/01/2019  Date of Discharge: 12/02/2019 Admitting Physician: Ezequiel Essex, MD  Primary Care Provider: Dickie La, MD Consultants: None  Indication for Hospitalization: Severe abdominal pain, nausea, vomiting  Discharge Diagnoses/Problem List:  Abdominal pain Chronic pancreatitis Chronic abdominal pain Bipolar affective disorder EtOH use Substance abuse Vitamin D deficiency  Asthma, mild intermittent  Disposition: Home  Discharge Condition: Stable  Discharge Exam:   General: Awake, alert, oriented Cardiovascular: Regular rate and rhythm, S1 and S2 present, no murmurs auscultated Respiratory: Lung fields clear to auscultation bilaterally Extremities: No bilateral lower extremity edema, palpable pedal and pretibial pulses bilaterally Neuro: Cranial nerves II through X grossly intact, able to move all extremities spontaneously   Brief Hospital Course:  Holly Mendez is a 44 y.o. female who presented with severe abdominal pain, nausea, vomiting and was admitted for IV fluid hydration and further evaluation.  Patient's past medical history significant for bipolar affective disorder, hypertension, morbid obesity, left eye blindness, chronic epigastric pain, vitamin D deficiency, chronic pancreatitis.   Abdominal pain, nausea and vomiting Patient presented to the ED with 4-day history of severe epigastric and upper abdominal pain, nausea, and vomiting.  Patient also found to be hypertensive and diaphoretic but afebrile.  Patient admitted to relapsing with alcohol at a party a couple days before onset of symptoms. She reports being sober since February, but relapsed and drank 8 bottles of wine coolers. Initially, concern for acute on chronic pancreatitis, however  lipase normal x2. Given epigastric/subxiphoid pain and reports of shortness of breath, obtain troponin; negative x2.  Abdominal and chest x-ray normal, demonstrated no bowel dilation, no free air under diaphragm, no edema or airspace opacity.  CT abdomen pelvis also largely normal and without acute findings.  Right upper quadrant ultrasound demonstrated cholecystitis without gallbladder wall thickening or pericholecystic fluid.  Blood alcohol less than 10, UDS s/p morphine in ED positive for opiates and cocaine.  Patient greatly improved overnight with IV hydration, antiemetic, and opioid pain relief.  The next morning she was back to her baseline and "felt great".  Well cholelithiasis may contribute to postprandial abdominal pain, and certainly would not account for all of the presenting symptoms.  Suspect alcohol and cocaine use precipitated this event.  Prior to discharge, she tolerated a clear liquid breakfast and regular diet lunch well without nausea or vomiting.  Discharged with return precautions and PCP follow up.      Hypertension Patient hypertensive in the emergency department 170's/80-90.  On her med list, there is clonidine patch that she wears once weekly (indication: Anger management per progress note 11/07/2017).  Patient reports she has not worn this for 3 weeks.  She is otherwise not on any other antihypertensive medications. Patient denied any chest pain, headaches, visual changes.  Blood pressure returned to normal levels with IV hydration, pain control, and Zofran.  Blood pressures day of discharge 116/71, 117/80 with pulses of 92 and 89 respectively.    Issues for Follow Up:  1. Creatinine-consider obtaining creatinine level, as it elevated somewhat after IV contrast for CT scan. 2. Follow-up with mental health provider-reports has not taken Latuda because she has not seen her in to help providers in about 4 months.  Declined Latuda while inpatient because she did not want to start  and stop. 3. Consider substance abuse treatment and counseling  4. Follow-up with PCP  Significant Procedures: None  Significant Labs and Imaging:  Recent Labs  Lab 12/01/19 1010 12/02/19 0509  WBC 7.4 12.0*  HGB 12.7 12.5  HCT 38.9 37.8  PLT 240 296   Recent Labs  Lab 12/01/19 1010 12/02/19 0509  NA 139 138  K 3.5 3.6  CL 103 101  CO2 24 26  GLUCOSE 134* 104*  BUN 6 7  CREATININE 0.87 1.42*  CALCIUM 9.1 9.2  ALKPHOS 107 99  AST 34 26  ALT 38 32  ALBUMIN 3.7 3.6   DG ABDOMEN ACUTE WITH 1 VIEW CHEST FINDINGS: PA chest: Lungs are clear. Heart is upper normal in size with pulmonary vascularity normal. No adenopathy. Supine and upright abdomen: There is moderate stool in the colon. There is no bowel dilatation or air-fluid level to suggest bowel obstruction. No free air. Probable tiny phlebolith right mid pelvis. IMPRESSION: Moderate stool in colon. No bowel obstruction or free air. No edema or airspace opacity. Heart upper normal in size.  CT ABDOMEN AND PELVIS WITH CONTRAST FINDINGS: Lower chest: Streaky bibasilar subpleural atelectasis but no definite infiltrates or effusions. Moderate breathing motion artifact but no obvious pulmonary lesions. The heart is within normal limits in size. No pericardial effusion. There is a small stable hiatal hernia.  Hepatobiliary: No hepatic lesions or intrahepatic biliary dilatation. The gallbladder is normal. No common bile duct dilatation. The portal and hepatic veins are patent.  Pancreas: No mass, inflammation or ductal dilatation. No peripancreatic fluid collections.  Spleen: Normal size. No focal lesions. Small accessory spleen in the splenic hilum.  Adrenals/Urinary Tract: Adrenal glands and kidneys are unremarkable. No renal lesions or hydronephrosis. The bladder is moderately distended but no bladder mass or calculi.  Stomach/Bowel: The stomach, duodenum, small bowel and colon are grossly normal without  oral contrast. No inflammatory changes, mass lesions or obstructive findings. The appendix is normal.  Vascular/Lymphatic: The aorta is normal in caliber. No dissection. The branch vessels are patent. The major venous structures are patent. No mesenteric or retroperitoneal mass or adenopathy. Small scattered lymph nodes are noted.  Reproductive: The uterus is surgically absent. I believe the right ovary is still present. I do not see the left ovary for certain.  Other: No pelvic mass or adenopathy. No free pelvic fluid collections. No inguinal mass or adenopathy. No abdominal wall hernia or subcutaneous lesions.  Musculoskeletal: No significant bony findings.  IMPRESSION: 1. No acute abdominal/pelvic findings, mass lesions or adenopathy. 2. Small stable hiatal hernia. 3. Moderate bladder distention.   ULTRASOUND ABDOMEN LIMITED RIGHT UPPER QUADRANT COMPARISON:  Abdominal ultrasound dated 11/17/2015 and CT dated 12/01/2019. FINDINGS: Gallbladder: There multiple stones within the gallbladder. No gallbladder wall thickening or pericholecystic fluid. Evaluation for Murphy's sign was limited as the patient was medicated.  Common bile duct: Diameter: 6 mm  Liver: No focal lesion identified. Within normal limits in parenchymal echogenicity. Portal vein is patent on color Doppler imaging with normal direction of blood flow towards the liver.  Other: None.  IMPRESSION: Cholelithiasis without definite sonographic evidence of acute cholecystitis.   Results/Tests Pending at Time of Discharge: None  Discharge Medications:  Allergies as of 12/02/2019      Reactions   Lyrica [pregabalin] Swelling   Swelling of hands and feet   Naproxen Swelling   Norvasc [amlodipine Besylate] Other (See Comments)   Mouth irritation    Zithromax [azithromycin] Other (See Comments)   Severe stomach cramping      Medication List  STOP taking these medications   Latuda 80 MG Tabs  tablet Generic drug: lurasidone     TAKE these medications   albuterol 108 (90 Base) MCG/ACT inhaler Commonly known as: VENTOLIN HFA INHALE TWO PUFFS INTO THE LUNGS EVERY 6 HOURS AS NEEDED WHEEZING OR SHORTNESS OF BREATH What changed: See the new instructions.   amitriptyline 25 MG tablet Commonly known as: ELAVIL TAKE THREE TABLETS BY MOUTH AT BEDTIME   baclofen 10 MG tablet Commonly known as: LIORESAL Take 1 tablet (10 mg total) by mouth 3 (three) times daily. What changed:   when to take this  reasons to take this   budesonide-formoterol 80-4.5 MCG/ACT inhaler Commonly known as: Symbicort Inhale 2 puffs into the lungs 2 (two) times daily as needed (shortness of breath).   Catapres-TTS-1 0.1 mg/24hr patch Generic drug: cloNIDine Place 1 patch (0.1 mg total) onto the skin once a week. What changed: See the new instructions.   cetirizine 10 MG tablet Commonly known as: ZYRTEC Take 1 tablet (10 mg total) by mouth daily.   esomeprazole 40 MG capsule Commonly known as: NEXIUM Take 1 capsule (40 mg total) by mouth daily.   famotidine 20 MG tablet Commonly known as: PEPCID Take 1 tablet (20 mg total) by mouth 2 (two) times daily.   hydrOXYzine 25 MG tablet Commonly known as: ATARAX/VISTARIL Take 25 mg by mouth 2 (two) times daily as needed for anxiety.   metroNIDAZOLE 500 MG tablet Commonly known as: FLAGYL Take 500 mg by mouth 2 (two) times daily.   Narcan 4 MG/0.1ML Liqd nasal spray kit Generic drug: naloxone Place 1 spray into the nose daily as needed (overdose).   ondansetron 4 MG disintegrating tablet Commonly known as: Zofran ODT 36m ODT q8 hours prn nausea/vomit What changed:   how much to take  how to take this  when to take this  reasons to take this  additional instructions   polyethylene glycol powder 17 GM/SCOOP powder Commonly known as: GaviLAX Take one or two capfuls daily by mouth as directed for constipation What changed:   how  much to take  how to take this  when to take this  reasons to take this  additional instructions   sucralfate 1 g tablet Commonly known as: CARAFATE TAKE ONE TABLET BY MOUTH FOUR TIMES DAILY WITH MEALS AND AT BEDTIME What changed: See the new instructions.   Vitamin D (Ergocalciferol) 1.25 MG (50000 UNIT) Caps capsule Commonly known as: DRISDOL Take 50,000 Units by mouth 2 (two) times a week.       Discharge Instructions: Please refer to Patient Instructions section of EMR for full details.  Patient was counseled important signs and symptoms that should prompt return to medical care, changes in medications, dietary instructions, activity restrictions, and follow up appointments.   Follow-Up Appointments:  Follow-up Information    NDickie La MD. Go on 12/10/2019.   Specialties: Family Medicine, Sports Medicine Why: You have a hospital follow up appointment with your primary care doctor on Wednesday 12/1 at 10:10am. Please arrive 15 minutes early to your appointment.  Contact information: [phone]               LEzequiel Essex MD 12/02/2019, 6:00 PM PGY-1, [place] Res[place] Addendum   I have independently interviewed and examined the patient. I have discussed the above with the original author and agree with their documentation.Please see also any attending notes.  Gifford Shave, MD PGY-2, [facility] 12/03/2019 2:10 PM  [place] Service pager: [phone] (text pages welcome through [facility])

## 2019-12-02 NOTE — Progress Notes (Addendum)
Discharge package printed and instructions given to patient. Patient verbalizes understanding. 

## 2019-12-02 NOTE — Hospital Course (Addendum)
Holly Mendez is a 44 y.o. female who presented with severe abdominal pain, nausea, vomiting and was admitted for IV fluid hydration and further evaluation.  Patient's past medical history significant for bipolar affective disorder, hypertension, morbid obesity, left eye blindness, chronic epigastric pain, vitamin D deficiency, chronic pancreatitis.   Abdominal pain, nausea and vomiting Patient presented to the ED with 4-day history of severe epigastric and upper abdominal pain, nausea, and vomiting.  Patient also found to be hypertensive and diaphoretic but afebrile.  Patient admitted to relapsing with alcohol at a party a couple days before onset of symptoms. She reports being sober since February, but relapsed and drank 8 bottles of wine coolers. Initially, concern for acute on chronic pancreatitis, however lipase normal x2. Given epigastric/subxiphoid pain and reports of shortness of breath, obtain troponin; negative x2.  Abdominal and chest x-ray normal, demonstrated no bowel dilation, no free air under diaphragm, no edema or airspace opacity.  CT abdomen pelvis also largely normal and without acute findings.  Right upper quadrant ultrasound demonstrated cholecystitis without gallbladder wall thickening or pericholecystic fluid.  Blood alcohol less than 10, UDS s/p morphine in ED positive for opiates and cocaine.  Patient greatly improved overnight with IV hydration, antiemetic, and opioid pain relief.  The next morning she was back to her baseline and "felt great".  Well cholelithiasis may contribute to postprandial abdominal pain, and certainly would not account for all of the presenting symptoms.  Suspect alcohol and cocaine use precipitated this event.  Prior to discharge, she tolerated a clear liquid breakfast and regular diet lunch well without nausea or vomiting.  Discharged with return precautions and PCP follow up.      Hypertension Patient hypertensive in the emergency department  170's/80-90.  On her med list, there is clonidine patch that she wears once weekly (indication: Anger management per progress note 11/07/2017).  Patient reports she has not worn this for 3 weeks.  She is otherwise not on any other antihypertensive medications. Patient denied any chest pain, headaches, visual changes.  Blood pressure returned to normal levels with IV hydration, pain control, and Zofran.  Blood pressures day of discharge 116/71, 117/80 with pulses of 92 and 89 respectively.

## 2019-12-02 NOTE — Plan of Care (Signed)
Patient admitted with acute on chronic pancreatitis. Has nausea and abdominal pain, patient has minimal relief with IV morphine. MD ordered a one time dose of IV dilaudid. Zofran administered for nausea. Problem: Education: Goal: Knowledge of General Education information will improve Description: Including pain rating scale, medication(s)/side effects and non-pharmacologic comfort measures Outcome: Progressing   Problem: Health Behavior/Discharge Planning: Goal: Ability to manage health-related needs will improve Outcome: Progressing   Problem: Clinical Measurements: Goal: Ability to maintain clinical measurements within normal limits will improve Outcome: Progressing Goal: Will remain free from infection Outcome: Progressing Goal: Diagnostic test results will improve Outcome: Progressing Goal: Respiratory complications will improve Outcome: Progressing Goal: Cardiovascular complication will be avoided Outcome: Progressing   Problem: Activity: Goal: Risk for activity intolerance will decrease Outcome: Progressing   Problem: Nutrition: Goal: Adequate nutrition will be maintained Outcome: Progressing   Problem: Coping: Goal: Level of anxiety will decrease Outcome: Progressing   Problem: Elimination: Goal: Will not experience complications related to bowel motility Outcome: Progressing Goal: Will not experience complications related to urinary retention Outcome: Progressing   Problem: Pain Managment: Goal: General experience of comfort will improve Outcome: Progressing   Problem: Safety: Goal: Ability to remain free from injury will improve Outcome: Progressing   Problem: Skin Integrity: Goal: Risk for impaired skin integrity will decrease Outcome: Progressing

## 2019-12-02 NOTE — Discharge Instructions (Signed)
Dear Holly Mendez,  Thank you for letting us participate in your care. You were hospitalized for abdominal pain, nausea, and vomiting.  You were treated with IV hydration, pain medicines, and nausea medicines. Your labs and imaging told us you did not have acute pancreatitis. The only thing we actually found were gallstones, or small stones in your gallbladder. While this may cause some abdominal pain after eating, it certainly wouldn't be a good explanation for what you experienced.  Given your account of the events leading up to this and your urine drug screen, we believe that recent alcohol consumption and cocaine usage may be the main reason you experienced this severe abdominal pain, nausea, and vomiting.  POST-HOSPITAL & CARE INSTRUCTIONS 1. Resume your normal diet as tolerated. 2. Follow-up with your primary care doctor.  You will need a follow-up blood test to measure your creatinine levels (a marker we use for kidney function).  Your creatinine was slightly elevated during your stay.  Is nothing to be concerned about, but we do want to make sure it goes back down to normal levels since you have recovered. 3. Follow-up with your mental health providers.  4. Consider substance abuse treatment and counseling as needed to stay healthy.  5. Go to your follow up appointments (listed below)   DOCTOR'S APPOINTMENT   Future Appointments  Date Time Provider [place]  12/23/2019  8:10 AM [facility] 2 [facility]     Take care and be well!  [facility]  [place], [place] [address] [phone]     Abdominal Pain, Adult Pain in the abdomen (abdominal pain) can be caused by many things. Often, abdominal pain is not serious and it gets better with no treatment or by being treated at home. However, sometimes abdominal pain is serious. Your health care provider will ask  questions about your medical history and do a physical exam to try to determine the cause of your abdominal pain. Follow these instructions at home:  Medicines  Take over-the-counter and prescription medicines only as told by your health care provider.  Do not take a laxative unless told by your health care provider. General instructions  Watch your condition for any changes.  Drink enough fluid to keep your urine pale yellow.  Keep all follow-up visits as told by your health care provider. This is important. Contact a health care provider if:  Your abdominal pain changes or gets worse.  You are not hungry or you lose weight without trying.  You are constipated or have diarrhea for more than 2-3 days.  You have pain when you urinate or have a bowel movement.  Your abdominal pain wakes you up at night.  Your pain gets worse with meals, after eating, or with certain foods.  You are vomiting and cannot keep anything down.  You have a fever.  You have blood in your urine. Get help right away if:  Your pain does not go away as soon as your health care provider told you to expect.  You cannot stop vomiting.  Your pain is only in areas of the abdomen, such as the right side or the left lower portion of the abdomen. Pain on the right side could be caused by appendicitis.  You have bloody or black stools, or stools that look like tar.  You have severe pain, cramping, or bloating in your abdomen.  You have  signs of dehydration, such as: ? Dark urine, very little urine, or no urine. ? Cracked lips. ? Dry mouth. ? Sunken eyes. ? Sleepiness. ? Weakness.  You have trouble breathing or chest pain. Summary  Often, abdominal pain is not serious and it gets better with no treatment or by being treated at home. However, sometimes abdominal pain is serious.  Watch your condition for any changes.  Take over-the-counter and prescription medicines only as told by your health  care provider.  Contact a health care provider if your abdominal pain changes or gets worse.  Get help right away if you have severe pain, cramping, or bloating in your abdomen. This information is not intended to replace advice given to you by your health care provider. Make sure you discuss any questions you have with your health care provider. Document Revised: 05/06/2018 Document Reviewed: 05/06/2018 Elsevier Patient Education  2020 [place] for [place] is a complex disease of the brain that causes an uncontrollable (compulsive) need for:  A substance. This includes alcohol, illegal drugs, or prescription medicines, such as painkillers.  An activity or behavior, such as gambling or shopping. Addiction changes the way your brain works. Because of this change:  The need for the medicine, drug, or activity can become so strong that you think about it all the time.  Getting more and more of your addiction becomes the most important thing to you.  You may find yourself leaving other activities and relationships to pursue your addiction.  You can become physically dependent on a substance.  Your health, behavior, emotions, and relationships can change for the worse. How do I know if I need treatment for addiction? Addiction is a progressive disease. Without treatment, addiction can get worse. Living with addiction puts you at higher risk for injury, poor health, loss of employment, loss of money, and even death. You might need treatment for addiction if:  You have tried to stop or cut down, but you have not succeeded.  You find it annoying that your friends and family are concerned about your use or behavior.  You feel guilty about your use or behavior.  You need a particular substance or activity to start your day or to calm down.  You are running out of money because of your addiction.  You have done something illegal to support your  addiction.  Your addiction has caused you: ? Health problems. ? Trouble in school, work, home, or with the police. ? To devote all your time to your addiction, and not to other responsibilities. ? To tell lies in order to hide your problem. What types of treatment are available? There may be options for treatment programs and plans based on your addiction, condition, needs, and preferences. No single treatment is right for everyone.  Treatment programs can be: ? Outpatient. You live at home and go to work or school, but you go to a clinic for treatment. ? Inpatient. You live and sleep at the program facility during treatment.  Programs may include: ? Medicine. You may need medicine to treat the addiction itself, or to treat anxiety or depression. ? Counseling and behavior therapy. This can help individuals and families behave in healthier ways and relate more effectively. ? Support groups. Confidential group therapy, such as a 12-step program, can help individuals and families during treatment and recovery. ? A combination of education, counseling, and a 12-step, spirituality-based approach. What should I consider when selecting a treatment program? Think  about your individual requirements when selecting a treatment program. Ask about:  The overall approach to treatment. ? Some programs are strictly 12-step programs. Some have a more flexible approach. ? Programs may differ in length of stay, setting, and size. ? Some programs include your family in your treatment plan. Support may be offered to them throughout the treatment process, as well as instructions for them when you are discharged. ? You may continue to receive support after you have left the program.  The types of medical services that are offered. Find out if the program: ? Offers specific treatment for your particular addiction. ? Meets all of your needs, including physical and cultural needs. ? Includes any medicines you  might need. ? Offers mental health counseling as part of your treatment. ? Offers the 12-step meetings at the center, or if transport is available for patients to attend meetings at other locations.  The cost and types of insurance that are accepted. ? Some programs are sponsored by the government. They support patients who do not have private insurance. ? If you do not have insurance, or if you choose to attend a program that does not accept your insurance, call the treatment center. Tell them your financial needs and whether a payment plan can be set up. ? There are also organizations that will help you find the resources for treatment. You can find them online by searching "treatment for addiction."  If the program is certified by the appropriate government agency. Where to find support  Your health care provider can help you to find the right treatment. These discussions are confidential.  The [employer] on Alcoholism and Drug Dependence (NCADD). This group has information about treatment centers and programs for people who have an addiction and for family members. ? Call: 1-800-NCA-CALL (416-323-6240). ? Visit the website: https://www.ncadd.org/  The [facility]). This organization will help you find publicly funded treatment centers, help hotlines, and counseling services near you. ? Call: 1-800-662-HELP 7258375014). ? Visit the website: www.findtreatment.SamedayNews.com.cy  The National Problem Gambling Helpline. This is a 24-hour confidential helpline for gambling addiction. ? Call: (850)368-3684 ? Visit the website: http://wiggins.com/ In countries outside of the [place] and [place], look in [employer] for contact information for services in your area. Follow these instructions at home:  Find supportive people who will help you stay away from your addiction and stay sober.  Do not use the substance or  engage in the activity.  If you have been through treatment: ? Follow your plan. The plan is usually developed by you and your health care provider during treatment. ? Go to meetings with other people in recovery. ? Avoid people, situations, and things that lead you to do the things you are addicted to (triggers). Summary  Addiction changes the way your brain works. These changes cause a desire to repeat and increase the use of the a substance or behavior.  Addiction is a progressive disease. Without treatment, addiction can get worse. Living with addiction puts you at higher risk for injury, poor health, loss of employment, loss of money, and even death.  There may be options for treatment programs and plans based on your addiction, condition, needs, and preferences. No single treatment is right for everyone.  Your health care provider can help you to find the right treatment. These discussions are confidential. This information is not intended to replace advice given to you by your health care provider. Make sure you  discuss any questions you have with your health care provider. Document Revised: 09/18/2018 Document Reviewed: 01/24/2017 Elsevier Patient Education  [place].

## 2019-12-02 NOTE — Plan of Care (Signed)
  Problem: Education: Goal: Knowledge of General Education information will improve Description: Including pain rating scale, medication(s)/side effects and non-pharmacologic comfort measures Outcome: Progressing   Problem: Health Behavior/Discharge Planning: Goal: Ability to manage health-related needs will improve Outcome: Progressing   Problem: Clinical Measurements: Goal: Ability to maintain clinical measurements within normal limits will improve Outcome: Progressing Goal: Will remain free from infection Outcome: Progressing Goal: Diagnostic test results will improve Outcome: Progressing Goal: Respiratory complications will improve Outcome: Progressing Goal: Cardiovascular complication will be avoided Outcome: Progressing   Problem: Elimination: Goal: Will not experience complications related to bowel motility Outcome: Progressing Goal: Will not experience complications related to urinary retention Outcome: Progressing   Problem: Pain Managment: Goal: General experience of comfort will improve Outcome: Progressing   

## 2019-12-05 ENCOUNTER — Other Ambulatory Visit: Payer: Self-pay

## 2019-12-05 ENCOUNTER — Encounter (HOSPITAL_COMMUNITY): Payer: Self-pay | Admitting: Emergency Medicine

## 2019-12-05 ENCOUNTER — Emergency Department (HOSPITAL_COMMUNITY)
Admission: EM | Admit: 2019-12-05 | Discharge: 2019-12-05 | Disposition: A | Discharge status: Home / Self Care | Payer: Medicaid Other | Attending: Emergency Medicine | Admitting: Emergency Medicine

## 2019-12-05 DIAGNOSIS — Z5321 Procedure and treatment not carried out due to patient leaving prior to being seen by health care provider: Secondary | ICD-10-CM | POA: Diagnosis not present

## 2019-12-05 DIAGNOSIS — R112 Nausea with vomiting, unspecified: Secondary | ICD-10-CM | POA: Diagnosis not present

## 2019-12-05 DIAGNOSIS — R1084 Generalized abdominal pain: Secondary | ICD-10-CM | POA: Diagnosis not present

## 2019-12-05 LAB — CBC
HCT: 40.8 % (ref 36.0–46.0)
Hemoglobin: 13 g/dL (ref 12.0–15.0)
MCH: 26.9 pg (ref 26.0–34.0)
MCHC: 31.9 g/dL (ref 30.0–36.0)
MCV: 84.3 fL (ref 80.0–100.0)
Platelets: 302 10*3/uL (ref 150–400)
RBC: 4.84 MIL/uL (ref 3.87–5.11)
RDW: 16 % — ABNORMAL HIGH (ref 11.5–15.5)
WBC: 10.5 10*3/uL (ref 4.0–10.5)
nRBC: 0 % (ref 0.0–0.2)

## 2019-12-05 LAB — I-STAT BETA HCG BLOOD, ED (MC, WL, AP ONLY): I-stat hCG, quantitative: <5 m[IU]/mL (ref <5)

## 2019-12-05 LAB — COMPREHENSIVE METABOLIC PANEL
ALT: 25 U/L (ref 0–44)
AST: 23 U/L (ref 15–41)
Albumin: 3.8 g/dL (ref 3.5–5.0)
Alkaline Phosphatase: 99 U/L (ref 38–126)
Anion gap: 10 (ref 5–15)
BUN: 7 mg/dL (ref 6–20)
CO2: 27 mmol/L (ref 22–32)
Calcium: 9 mg/dL (ref 8.9–10.3)
Chloride: 102 mmol/L (ref 98–111)
Creatinine, Ser: 0.83 mg/dL (ref 0.44–1.00)
GFR, Estimated: >60 mL/min (ref >60)
Glucose, Bld: 114 mg/dL — ABNORMAL HIGH (ref 70–99)
Potassium: 4 mmol/L (ref 3.5–5.1)
Sodium: 139 mmol/L (ref 135–145)
Total Bilirubin: <0.1 mg/dL — ABNORMAL LOW (ref 0.3–1.2)
Total Protein: 7.4 g/dL (ref 6.5–8.1)

## 2019-12-05 LAB — LIPASE, BLOOD: Lipase: 39 U/L (ref 11–51)

## 2019-12-05 NOTE — ED Notes (Signed)
Pt witnessed seen leaving.

## 2019-12-05 NOTE — ED Notes (Addendum)
Pt frustrated about waiting and wanting to be seen at [facility], this NT notified pt that care is already started here and that we do have a little bit of a wait to be seen but triage nurse made pt a 2 which puts her ahead of other pt's to be seen. Pt still frustrated and demands to be seen before others waiting before her.

## 2019-12-05 NOTE — ED Triage Notes (Addendum)
Pt to ED via [facility] with generalized abd pain onset this am   Pt was recently admitted for pancreatitis   Pt also c/o nausea and vomiting Pt crying "loudly" in triage

## 2019-12-06 ENCOUNTER — Emergency Department (HOSPITAL_COMMUNITY)
Admission: EM | Admit: 2019-12-06 | Discharge: 2019-12-06 | Disposition: A | Discharge status: Home / Self Care | Payer: Medicaid Other | Attending: Emergency Medicine | Admitting: Emergency Medicine

## 2019-12-06 ENCOUNTER — Other Ambulatory Visit: Payer: Self-pay

## 2019-12-06 ENCOUNTER — Emergency Department (HOSPITAL_COMMUNITY): Payer: Medicaid Other

## 2019-12-06 DIAGNOSIS — Z79899 Other long term (current) drug therapy: Secondary | ICD-10-CM | POA: Insufficient documentation

## 2019-12-06 DIAGNOSIS — R55 Syncope and collapse: Secondary | ICD-10-CM | POA: Diagnosis present

## 2019-12-06 DIAGNOSIS — J45909 Unspecified asthma, uncomplicated: Secondary | ICD-10-CM | POA: Insufficient documentation

## 2019-12-06 DIAGNOSIS — F17210 Nicotine dependence, cigarettes, uncomplicated: Secondary | ICD-10-CM | POA: Diagnosis not present

## 2019-12-06 DIAGNOSIS — K219 Gastro-esophageal reflux disease without esophagitis: Secondary | ICD-10-CM | POA: Diagnosis not present

## 2019-12-06 DIAGNOSIS — G8929 Other chronic pain: Secondary | ICD-10-CM | POA: Insufficient documentation

## 2019-12-06 DIAGNOSIS — R1011 Right upper quadrant pain: Secondary | ICD-10-CM

## 2019-12-06 DIAGNOSIS — I10 Essential (primary) hypertension: Secondary | ICD-10-CM | POA: Diagnosis not present

## 2019-12-06 DIAGNOSIS — R109 Unspecified abdominal pain: Secondary | ICD-10-CM | POA: Diagnosis not present

## 2019-12-06 DIAGNOSIS — R112 Nausea with vomiting, unspecified: Secondary | ICD-10-CM

## 2019-12-06 LAB — CBC WITH DIFFERENTIAL/PLATELET
Abs Immature Granulocytes: 0.03 10*3/uL (ref 0.00–0.07)
Basophils Absolute: 0 10*3/uL (ref 0.0–0.1)
Basophils Relative: 0 %
Eosinophils Absolute: 0 10*3/uL (ref 0.0–0.5)
Eosinophils Relative: 0 %
HCT: 41.4 % (ref 36.0–46.0)
Hemoglobin: 13.3 g/dL (ref 12.0–15.0)
Immature Granulocytes: 0 %
Lymphocytes Relative: 11 %
Lymphs Abs: 1.1 10*3/uL (ref 0.7–4.0)
MCH: 26.8 pg (ref 26.0–34.0)
MCHC: 32.1 g/dL (ref 30.0–36.0)
MCV: 83.5 fL (ref 80.0–100.0)
Monocytes Absolute: 0.3 10*3/uL (ref 0.1–1.0)
Monocytes Relative: 3 %
Neutro Abs: 8.5 10*3/uL — ABNORMAL HIGH (ref 1.7–7.7)
Neutrophils Relative %: 86 %
Platelets: 312 10*3/uL (ref 150–400)
RBC: 4.96 MIL/uL (ref 3.87–5.11)
RDW: 15.9 % — ABNORMAL HIGH (ref 11.5–15.5)
WBC: 9.9 10*3/uL (ref 4.0–10.5)
nRBC: 0 % (ref 0.0–0.2)

## 2019-12-06 LAB — ACETAMINOPHEN LEVEL: Acetaminophen (Tylenol), Serum: <10 ug/mL — ABNORMAL LOW (ref 10–30)

## 2019-12-06 LAB — HCG, QUANTITATIVE, PREGNANCY: hCG, Beta Chain, Quant, S: <1 m[IU]/mL (ref <5)

## 2019-12-06 LAB — HEPATIC FUNCTION PANEL
ALT: 27 U/L (ref 0–44)
AST: 25 U/L (ref 15–41)
Albumin: 4.1 g/dL (ref 3.5–5.0)
Alkaline Phosphatase: 106 U/L (ref 38–126)
Bilirubin, Direct: <0.1 mg/dL (ref 0.0–0.2)
Total Bilirubin: 0.6 mg/dL (ref 0.3–1.2)
Total Protein: 8.3 g/dL — ABNORMAL HIGH (ref 6.5–8.1)

## 2019-12-06 LAB — RAPID URINE DRUG SCREEN, HOSP PERFORMED
Amphetamines: NOT DETECTED
Barbiturates: NOT DETECTED
Benzodiazepines: NOT DETECTED
Cocaine: POSITIVE — AB
Opiates: NOT DETECTED
Tetrahydrocannabinol: NOT DETECTED

## 2019-12-06 LAB — BASIC METABOLIC PANEL
Anion gap: 13 (ref 5–15)
BUN: 5 mg/dL — ABNORMAL LOW (ref 6–20)
CO2: 25 mmol/L (ref 22–32)
Calcium: 9.4 mg/dL (ref 8.9–10.3)
Chloride: 97 mmol/L — ABNORMAL LOW (ref 98–111)
Creatinine, Ser: 0.85 mg/dL (ref 0.44–1.00)
GFR, Estimated: >60 mL/min (ref >60)
Glucose, Bld: 159 mg/dL — ABNORMAL HIGH (ref 70–99)
Potassium: 3.4 mmol/L — ABNORMAL LOW (ref 3.5–5.1)
Sodium: 135 mmol/L (ref 135–145)

## 2019-12-06 LAB — AMMONIA: Ammonia: 31 umol/L (ref 9–35)

## 2019-12-06 LAB — ETHANOL: Alcohol, Ethyl (B): <10 mg/dL (ref <10)

## 2019-12-06 LAB — LIPASE, BLOOD: Lipase: 37 U/L (ref 11–51)

## 2019-12-06 LAB — SALICYLATE LEVEL: Salicylate Lvl: <7 mg/dL — ABNORMAL LOW (ref 7.0–30.0)

## 2019-12-06 MED ORDER — ONDANSETRON HCL 4 MG/2ML IJ SOLN
4.0000 mg | Freq: Once | INTRAMUSCULAR | Status: AC
  Administered 2019-12-06: 4 mg via INTRAVENOUS
  Filled 2019-12-06: qty 2

## 2019-12-06 MED ORDER — METOCLOPRAMIDE HCL 5 MG/ML IJ SOLN
10.0000 mg | Freq: Once | INTRAMUSCULAR | Status: AC
  Administered 2019-12-06: 10 mg via INTRAVENOUS
  Filled 2019-12-06: qty 2

## 2019-12-06 MED ORDER — SODIUM CHLORIDE 0.9 % IV BOLUS
1000.0000 mL | Freq: Once | INTRAVENOUS | Status: AC
  Administered 2019-12-06: 1000 mL via INTRAVENOUS

## 2019-12-06 MED ORDER — ONDANSETRON HCL 4 MG PO TABS: 4.0000 mg | ORAL_TABLET | Freq: Three times a day (TID) | ORAL | 0 refills | Status: DC | PRN

## 2019-12-06 MED ORDER — FENTANYL CITRATE (PF) 100 MCG/2ML IJ SOLN
50.0000 ug | Freq: Once | INTRAMUSCULAR | Status: AC
  Administered 2019-12-06: 50 ug via INTRAVENOUS
  Filled 2019-12-06: qty 2

## 2019-12-06 NOTE — Discharge Instructions (Signed)
Your work-up today was overall reassuring and we did not see any concerning findings on all of your lab work.  After medications and fluids, you were able to feel better.  The ultrasound still shows evidence of gallstones with no evidence of acute cholecystitis again.  Please rest and stay hydrated and use your nausea medication double discomfort.  Please also follow-up with general surgery for outpatient follow-up of your gallstones and GI for your abdominal pain.

## 2019-12-06 NOTE — ED Notes (Signed)
Patient transported to Ultrasound 
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
## 2019-12-06 NOTE — ED Notes (Signed)
Patient responds to sternal rub quickly with clear response of "stop, that hurts". Patient assisted staff with standing for transfer from stretcher to wheelchair. Patient answers some simple questions with nods/shakes of her head but will not converse with staff/answer complex questions.

## 2019-12-06 NOTE — ED Notes (Signed)
Patient used BSC mostly independently. Urine sample sent for UDS.

## 2019-12-06 NOTE — ED Notes (Signed)
Patient woke up heaving, requesting something for nausea. Spitting up white sputum. No emesis. Patient requested warm blanket, same provided. Assisted pt with repositioning, able to do mostly independently.

## 2019-12-06 NOTE — ED Notes (Signed)
Extra tubes drawn during IV initiation and sent to lab

## 2019-12-06 NOTE — ED Provider Notes (Signed)
7:43 AM Care assumed from Dr. Betsey Holiday.  At time of transfer of care, patient is awaiting reassessment after laboratory work-up results.  Patient recently had similar evaluation and work-up including a CT scan several days ago showing no acute abdominal/pelvic findings.  Also had an ultrasound showing no evidence of acute cholecystitis but did have some gallstones.  Laboratory testing this morning shows noted of elevated LFTs or elevated lipase.  Ammonia not elevated.  Work-up also wound prior.  UDS in process.  Anticipate reassessment after medications but anticipate discharge home for outpatient follow-up.  10:14 AM I just reassessed patient after her work-up completed with a positive UDS for cocaine.  Patient reports she is still having terrible nausea, vomiting abdominal pain.  She is tender in the right upper quadrant epigastric area.  Given the recent ultrasound showing stones, I do feel it is reasonable to get 1 last ultrasound to make sure she is not any acute cholecystitis development.  We will give some pain medicine, nausea medicine, and some fluids as she is still tachycardic.  Anticipate reassessment after ultrasound.  Ultrasound returned showing no evidence of acute cholecystitis.  Patient be given instructions to follow-up with GI and general surgery for her abdominal pain in the setting of known gallstones.  Patient was feeling better on reassessment.   She will be discharged home for outpatient follow-up.  She understood return precautions and was discharged in good condition.   Clinical Impression: 1. Chronic abdominal pain   2. RUQ abdominal pain   3. Non-intractable vomiting with nausea, unspecified vomiting type     Disposition: Discharge  Condition: Good  I have discussed the results, Dx and Tx plan with the pt(& family if present). He/she/they expressed understanding and agree(s) with the plan. Discharge instructions discussed at great length. Strict return precautions  discussed and pt &/or family have verbalized understanding of the instructions. No further questions at time of discharge.    Discharge Medication List as of 12/06/2019  2:44 PM     START taking these medications   Details  ondansetron (ZOFRAN) 4 MG tablet Take 1 tablet (4 mg total) by mouth every 8 (eight) hours as needed for nausea or vomiting., Starting Sat 12/06/2019, Print        Follow Up: Dickie La, MD [address] [place] [address] [facility] [place] [address] [phone]    Gastroenterology, [place] [address] [phone]     Surgery, [place] [address] [place] [address] [phone]        Stevana Dufner, Gwenyth Allegra, MD 12/06/19 1544

## 2019-12-06 NOTE — ED Notes (Signed)
Reviewed discharge instructions with patient. Follow-up care and medications reviewed. Patient verbalized understanding. Patient A&Ox4, VSS upon discharge. 

## 2019-12-06 NOTE — ED Notes (Signed)
Patient's sister came to department briefly, explained that patient has had these symptoms for days and has been in and out of the ED due to same. Hx of gallstones.

## 2019-12-06 NOTE — ED Triage Notes (Addendum)
Pt is in triage and is all over the place and is not able to answer any questions. Pt was brought in from family and was passing out and falling out. Pt will come to when we sternal rubbing her, Pt's family told tech that she takes a lot of medications and has been like this all day in and out.

## 2019-12-10 ENCOUNTER — Ambulatory Visit: Payer: Medicaid Other | Admitting: Family Medicine

## 2019-12-16 ENCOUNTER — Other Ambulatory Visit: Payer: Self-pay | Admitting: Family Medicine

## 2019-12-23 ENCOUNTER — Ambulatory Visit: Payer: Medicaid Other

## 2020-01-16 ENCOUNTER — Other Ambulatory Visit: Payer: Self-pay | Admitting: Family Medicine

## 2020-01-21 ENCOUNTER — Other Ambulatory Visit: Payer: Self-pay | Admitting: Family Medicine

## 2020-01-29 ENCOUNTER — Telehealth: Payer: Self-pay | Admitting: Family Medicine

## 2020-01-29 NOTE — Telephone Encounter (Signed)
Patient is calling to give a message to doctor. Chronic Pancreatis. She is needing a refill on Zofran. Please advise. Thanks

## 2020-02-02 MED ORDER — ONDANSETRON HCL 4 MG PO TABS: 4.0000 mg | ORAL_TABLET | Freq: Three times a day (TID) | ORAL | 0 refills | Status: DC | PRN

## 2020-02-04 ENCOUNTER — Ambulatory Visit
Admission: RE | Admit: 2020-02-04 | Discharge: 2020-02-04 | Disposition: A | Discharge status: Home / Self Care | Payer: Medicaid Other | Source: Ambulatory Visit | Attending: Family Medicine | Admitting: Family Medicine

## 2020-02-04 ENCOUNTER — Other Ambulatory Visit: Payer: Self-pay

## 2020-02-04 DIAGNOSIS — Z1231 Encounter for screening mammogram for malignant neoplasm of breast: Secondary | ICD-10-CM

## 2020-02-06 ENCOUNTER — Other Ambulatory Visit: Payer: Self-pay | Admitting: Family Medicine

## 2020-02-20 ENCOUNTER — Telehealth: Payer: Self-pay

## 2020-02-20 MED ORDER — GUAIFENESIN ER 600 MG PO TB12: 600.0000 mg | ORAL_TABLET | Freq: Two times a day (BID) | ORAL | 0 refills | Status: DC

## 2020-02-20 NOTE — Telephone Encounter (Signed)
Patient calls nurse line requesting Mucinex prescription to her pharmacy. Patient reports cough and congestion. Patient states she gets these same symptoms every year. Patient does not have the money to purchase OTC. Will forward to PCP.

## 2020-03-16 ENCOUNTER — Other Ambulatory Visit: Payer: Self-pay | Admitting: Family Medicine

## 2020-03-24 ENCOUNTER — Ambulatory Visit (INDEPENDENT_AMBULATORY_CARE_PROVIDER_SITE_OTHER): Payer: Medicaid Other | Admitting: Family Medicine

## 2020-03-24 ENCOUNTER — Encounter: Payer: Self-pay | Admitting: Family Medicine

## 2020-03-24 ENCOUNTER — Other Ambulatory Visit: Payer: Self-pay

## 2020-03-24 DIAGNOSIS — R1013 Epigastric pain: Secondary | ICD-10-CM

## 2020-03-24 DIAGNOSIS — G8929 Other chronic pain: Secondary | ICD-10-CM

## 2020-03-24 NOTE — Patient Instructions (Addendum)
Please call Dr. Ulyses Amor office and see if they will schedule you for a screening colonoscopy. Great to see you!

## 2020-03-25 NOTE — Progress Notes (Signed)
    CHIEF COMPLAINT / HPI: #1.  Continues to have abdominal pain, diffuse, episodically it is crampy.  Continues to use Nexium and on occasion uses ondansetron.  She is unclear whether or not she has gallstones.  She was initially told that she had gallstones and they scheduled her for sharp surgery.  She canceled the surgery the first time and then when it was scheduled the second time the surgeon canceled it so she is quite confused. 2.  Unfortunately lost her shower chair when they made a move.  Wants to know if she can get a new one. 3.  Using amitriptyline successfully for sleep issues.  No side effects from that.   PERTINENT  PMH / PSH: I have reviewed the patient's medications, allergies, past medical and surgical history, smoking status and updated in the EMR as appropriate.   OBJECTIVE:  BP 112/74   Pulse 93   Ht 5\' 5"  (1.651 m)   Wt 257 lb 12.8 oz (116.9 kg)   LMP 08/31/2018   SpO2 99%   BMI 42.90 kg/m   Vital signs reviewed. GENERAL: Well-developed, well-nourished, no acute distress. CARDIOVASCULAR: Regular rate and rhythm no murmur gallop or rub LUNGS: Clear to auscultation bilaterally, no rales or wheeze. ABDOMEN: Soft positive bowel sounds NEURO: No gross focal neurological deficits. MSK: Movement of extremity x 4.   ASSESSMENT / PLAN:   No problem-specific Assessment & Plan notes found for this encounter.   [name] MD

## 2020-03-25 NOTE — Assessment & Plan Note (Signed)
Her most recent ultrasound in the emergency department did not show gallstones.  I understand her confusion.  She has had chronic abdominal pain that at one time was thought to be related to chronic pancreatitis and then it was thought to be related to chronic marijuana use.  I urged her to call her previous gastroenterologist and see if they will see her in referral again.  I tried to refer her to the other 2 offices in town and neither would take her since she had prior history with Dr. Almyra Free.  Is also time for her to potentially get a screening colonoscopy as she is 58.  She will let me know the results of that call.

## 2020-04-01 ENCOUNTER — Ambulatory Visit: Payer: Self-pay | Admitting: Licensed Clinical Social Worker

## 2020-04-01 ENCOUNTER — Telehealth: Payer: Self-pay | Admitting: *Deleted

## 2020-04-01 NOTE — Telephone Encounter (Signed)
Patient called today stating that she spoke with Dr. Nori Riis at her last appointment about wanting to restart her personal care services and wanted to know the status.  I informed patient that I didn't see any mention of the form but that I would be happy to complete what I could and put it in the provider's box for her to complete.  Explained to patient this could take a few days and then we would fax it to [facility] and they would reach out to her sometime next week.  Patient voiced understanding and will wait to hear from them.  Clinical portion on PCS form completed and placed in provider box.  Jia Mohamed,CMA

## 2020-04-01 NOTE — Chronic Care Management (AMB) (Signed)
  Care Management  Consultation Note  04/01/2020 Name: Holly Mendez MRN: [id number] DOB: November 19, 1975  Holly Mendez is a 45 y.o. year old female who is a primary care patient of Dickie La, MD. The CCM team was consulted by [facility] staff reference care coordination needs for PCS.  Patient called office asking for assistance.  Intervention: Patient was not interviewed or contacted during this encounter.   CCM LCSW collaborated with [facility] referral coordinator and [facility] staff .  Conducted brief assessment, recommendations and relevant information discussed as well as placing CCM referral for patient's needs.  Follow up Plan: If further intervention is needed the care management team is available to follow up after a formal CCM referral is placed     Collaboration with Dickie La, MD regarding development and update of comprehensive plan of care as evidenced by provider attestation and co-signature Review of patient past medical history, allergies, medications, and health status, including review of pertinent consultant reports was performed as part of comprehensive evaluation and provision of care management/care coordination services.   Care Plan Conditions to be addressed/monitored per PCP order: Level of care concerns  There are no care plans to display for this patient.    Maurine Cane, LCSW

## 2020-04-02 ENCOUNTER — Other Ambulatory Visit: Payer: Self-pay | Admitting: Family Medicine

## 2020-04-05 ENCOUNTER — Telehealth: Payer: Self-pay | Admitting: Family Medicine

## 2020-04-05 NOTE — Telephone Encounter (Signed)
Patient is calling to check the status of home health referral. Please advise. Thanks!

## 2020-04-07 ENCOUNTER — Other Ambulatory Visit: Payer: Self-pay | Admitting: Family Medicine

## 2020-04-08 ENCOUNTER — Other Ambulatory Visit: Payer: Self-pay

## 2020-04-08 ENCOUNTER — Ambulatory Visit (INDEPENDENT_AMBULATORY_CARE_PROVIDER_SITE_OTHER): Payer: Medicaid Other | Admitting: Behavioral Health

## 2020-04-08 DIAGNOSIS — F314 Bipolar disorder, current episode depressed, severe, without psychotic features: Secondary | ICD-10-CM | POA: Diagnosis not present

## 2020-04-08 NOTE — Telephone Encounter (Signed)
Patient is calling back to check on the status of form being completed. I reminded her of turn around time and the message below. She would like Dr. Nori Riis to call her to discuss form being completed and sent to [facility].   The best call back is [phone].
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
## 2020-04-12 NOTE — Telephone Encounter (Signed)
Patient is calling back again and would like for Dr. Nori Riis to call her to discuss below.

## 2020-04-19 NOTE — Telephone Encounter (Signed)
Form faxed to [employer]. Copy made and placed in batch scanning.   Talbot Grumbling, RN

## 2020-04-19 NOTE — Telephone Encounter (Signed)
completed and faxed

## 2020-04-19 NOTE — Telephone Encounter (Signed)
This has been done.

## 2020-05-04 ENCOUNTER — Other Ambulatory Visit: Payer: Self-pay | Admitting: Family Medicine

## 2020-05-17 IMAGING — CT CT ABD-PELV W/ CM
2 of 5 series · 17 of 46 positions shown, 19 images · IV contrast (APPLIED)
Comparison: 12/05/2017

CLINICAL DATA: Abdominal pain and vomiting. Hysterectomy scheduled
next week.

EXAM:
CT ABDOMEN AND PELVIS WITH CONTRAST
TECHNIQUE: Multidetector CT imaging of the abdomen and pelvis was performed
using the standard protocol following bolus administration of
intravenous contrast.
CONTRAST:  100mL OMNIPAQUE IOHEXOL 300 MG/ML  SOLN

[Series 3: abdomen 5.0 · axial · 0.93mm/px · z∈[+685,+1100]mm · 14 of 95 slices shown, 16 images]
[im 6/95  soft-tissue]
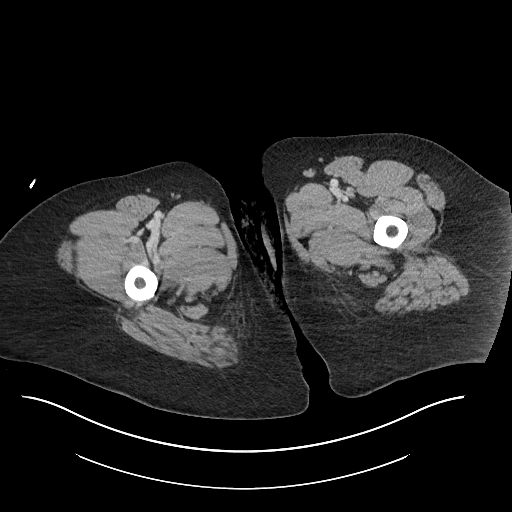
[im 6/95  bone]
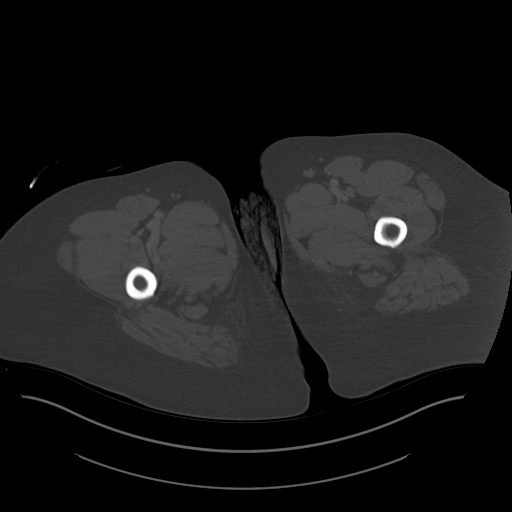
[im 12/95  soft-tissue]
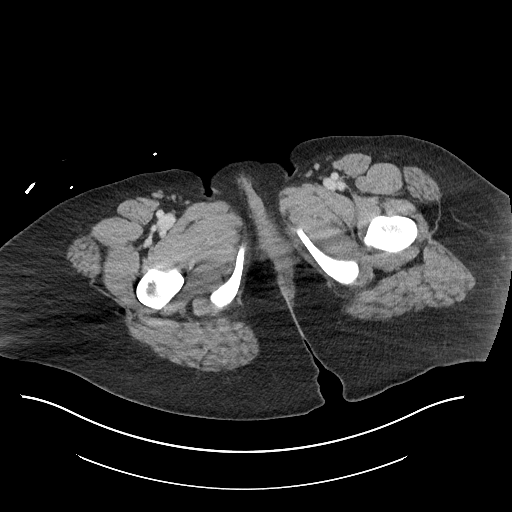
[im 18/95  soft-tissue]
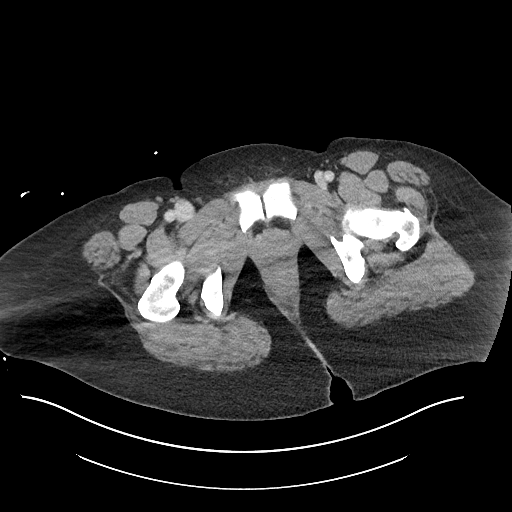
[im 24/95  soft-tissue]
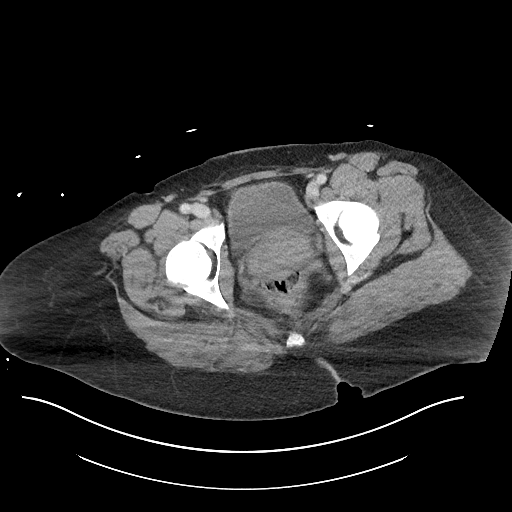
[im 30/95  soft-tissue]
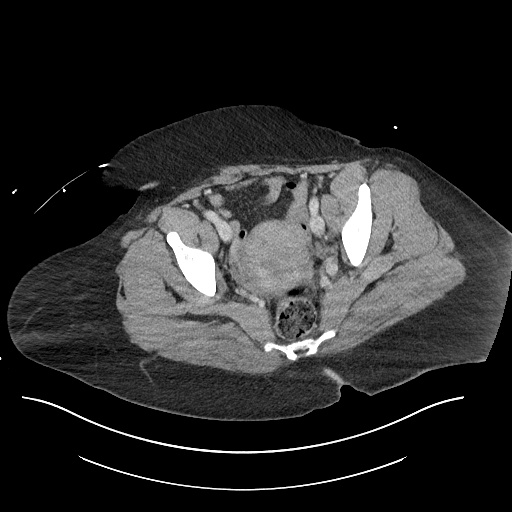
[im 36/95  soft-tissue]
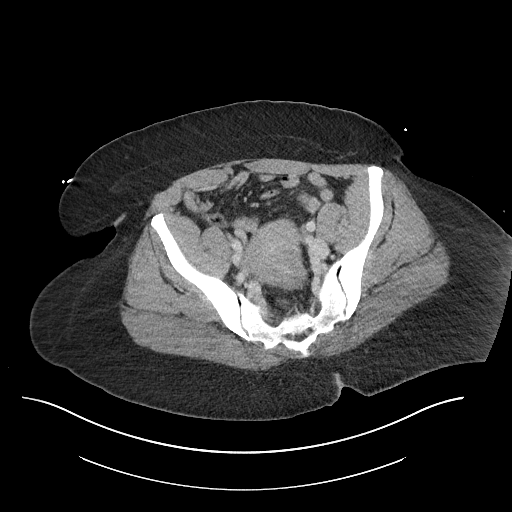
[im 42/95  soft-tissue]
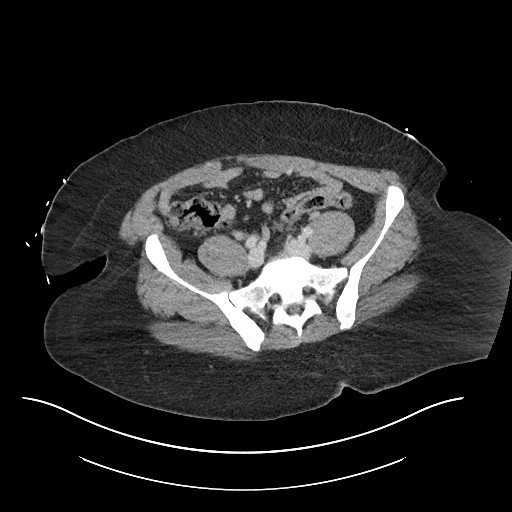
[im 53/95  soft-tissue]
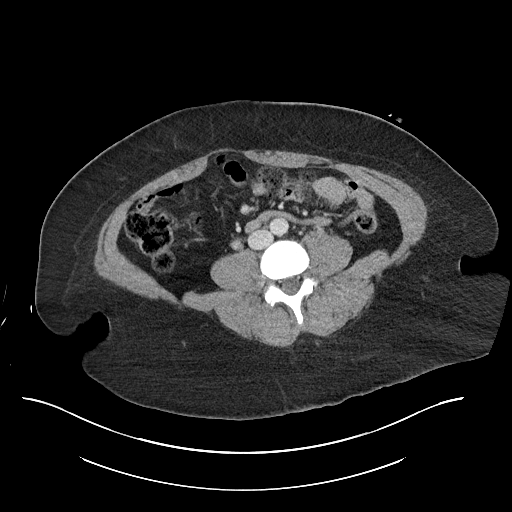
[im 59/95  soft-tissue]
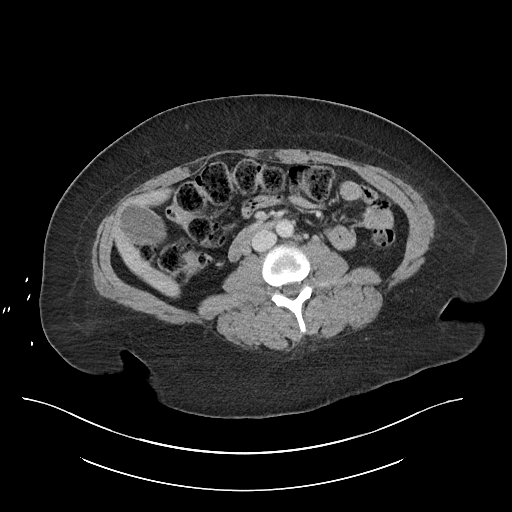
[im 59/95  bone]
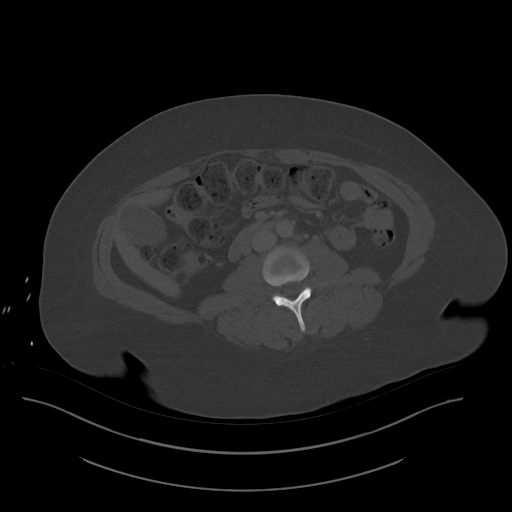
[im 65/95  soft-tissue]
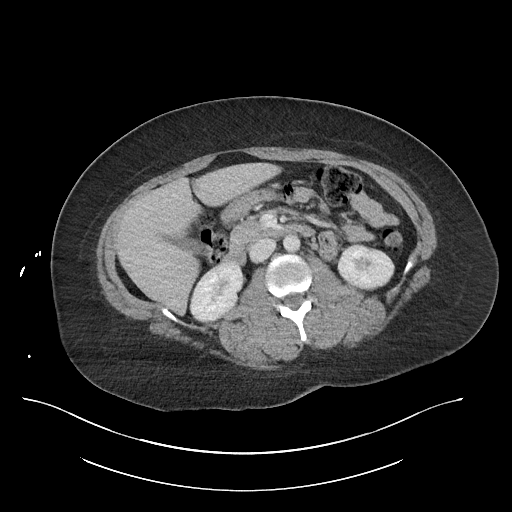
[im 71/95  soft-tissue]
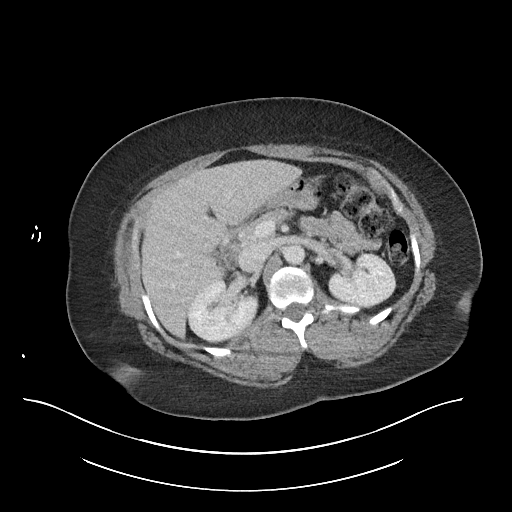
[im 77/95  soft-tissue]
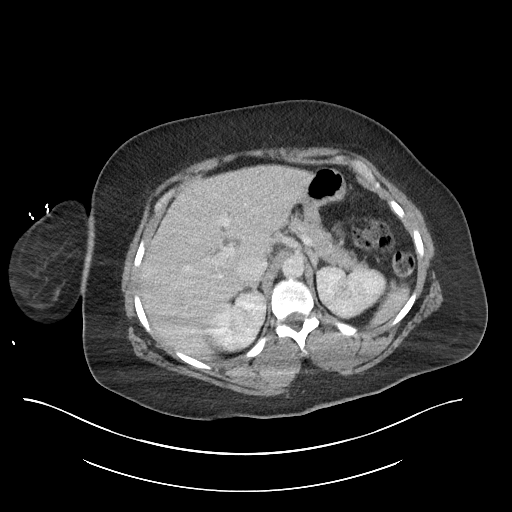
[im 83/95  soft-tissue]
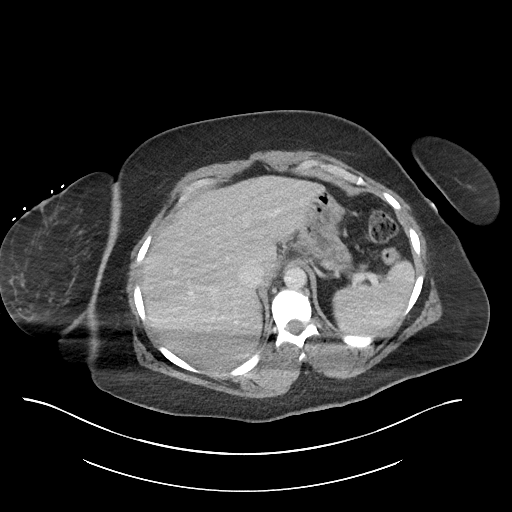
[im 89/95  soft-tissue]
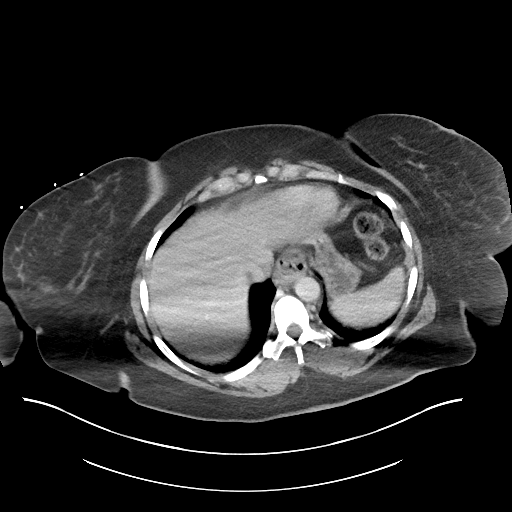

[Series 6: abdomen 3.0 mpr cor · coronal · 0.92mm/px · 3 of 100 slices shown]
[im 34/100  soft-tissue]
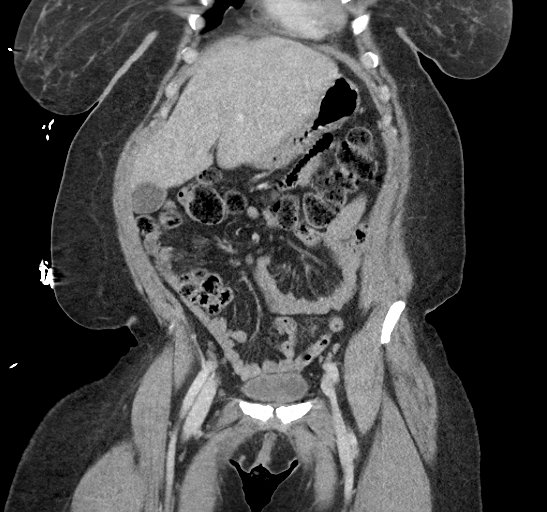
[im 45/100  soft-tissue]
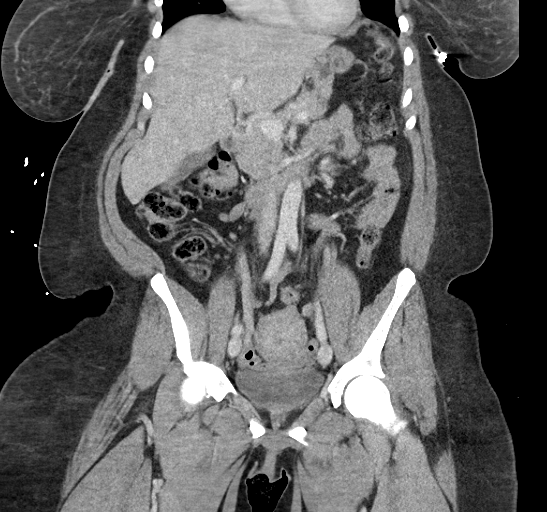
[im 56/100  soft-tissue]
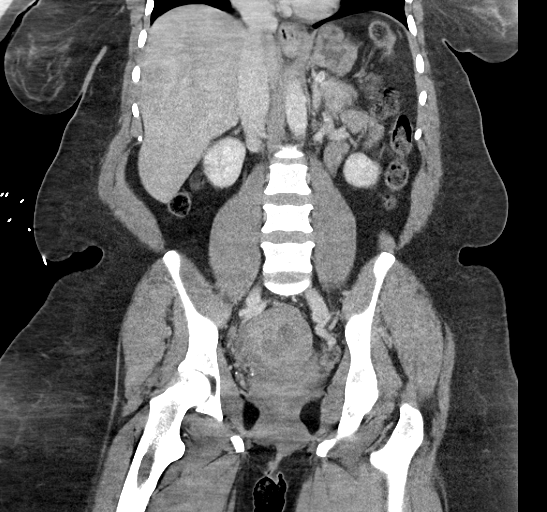

[17 of 46 positions shown; findings below may reference images not displayed]

FINDINGS: Lower chest: Lung bases are clear, allowing for motion artifact.
Small esophageal hiatal hernia.

Hepatobiliary: No focal liver abnormality is seen. No gallstones,
gallbladder wall thickening, or biliary dilatation.

Pancreas: Unremarkable. No pancreatic ductal dilatation or
surrounding inflammatory changes.

Spleen: Normal in size without focal abnormality.

Adrenals/Urinary Tract: Adrenal glands are unremarkable. Kidneys are
normal, without renal calculi, focal lesion, or hydronephrosis.
Bladder is unremarkable.

Stomach/Bowel: Stomach is within normal limits. Appendix appears
normal. No evidence of bowel wall thickening, distention, or
inflammatory changes.

Vascular/Lymphatic: No significant vascular findings are present. No
enlarged abdominal or pelvic lymph nodes.

Reproductive: Uterus is enlarged and heterogeneous with posterior
fibroids. No adnexal mass.

Other: No free air or free fluid in the abdomen. Small periumbilical
abdominal wall hernia containing a portion of the small bowel. No
proximal obstruction.

Musculoskeletal: No acute or significant osseous findings.
IMPRESSION: No acute process demonstrated in the abdomen or pelvis. No evidence
of bowel obstruction or inflammation. Uterine fibroids. Small
periumbilical abdominal wall hernia containing small bowel. Small
esophageal hiatal hernia.

## 2020-05-26 ENCOUNTER — Other Ambulatory Visit: Payer: Self-pay

## 2020-05-26 ENCOUNTER — Encounter: Payer: Self-pay | Admitting: Family Medicine

## 2020-05-26 ENCOUNTER — Ambulatory Visit (INDEPENDENT_AMBULATORY_CARE_PROVIDER_SITE_OTHER): Payer: Medicaid Other | Admitting: Family Medicine

## 2020-05-26 VITALS — BP 124/82 | HR 88 | Ht 65.0 in | Wt 246.6 lb

## 2020-05-26 DIAGNOSIS — I10 Essential (primary) hypertension: Secondary | ICD-10-CM

## 2020-05-26 DIAGNOSIS — Z0000 Encounter for general adult medical examination without abnormal findings: Secondary | ICD-10-CM

## 2020-05-26 DIAGNOSIS — R1013 Epigastric pain: Secondary | ICD-10-CM | POA: Diagnosis not present

## 2020-05-26 DIAGNOSIS — G8929 Other chronic pain: Secondary | ICD-10-CM

## 2020-05-26 DIAGNOSIS — J302 Other seasonal allergic rhinitis: Secondary | ICD-10-CM | POA: Diagnosis not present

## 2020-05-26 MED ORDER — FLUTICASONE PROPIONATE 50 MCG/ACT NA SUSP: 2.0000 | Freq: Every day | NASAL | 6 refills | Status: DC

## 2020-05-26 NOTE — Patient Instructions (Signed)
I will send you a note about your blood wok and I will check on a screeninng colonoscopy for you Let me see you in 6 months.

## 2020-05-27 ENCOUNTER — Encounter: Payer: Self-pay | Admitting: Family Medicine

## 2020-05-27 DIAGNOSIS — G8929 Other chronic pain: Secondary | ICD-10-CM | POA: Insufficient documentation

## 2020-05-27 DIAGNOSIS — Z0000 Encounter for general adult medical examination without abnormal findings: Secondary | ICD-10-CM | POA: Insufficient documentation

## 2020-05-27 DIAGNOSIS — Z471 Aftercare following joint replacement surgery: Secondary | ICD-10-CM | POA: Insufficient documentation

## 2020-05-27 LAB — COMPREHENSIVE METABOLIC PANEL
ALT: 18 IU/L (ref 0–32)
AST: 23 IU/L (ref 0–40)
Albumin/Globulin Ratio: 1.4 (ref 1.2–2.2)
Albumin: 4.3 g/dL (ref 3.8–4.8)
Alkaline Phosphatase: 108 IU/L (ref 44–121)
BUN/Creatinine Ratio: 14 (ref 9–23)
BUN: 12 mg/dL (ref 6–24)
Bilirubin Total: 0.2 mg/dL (ref 0.0–1.2)
CO2: 21 mmol/L (ref 20–29)
Calcium: 9.2 mg/dL (ref 8.7–10.2)
Chloride: 100 mmol/L (ref 96–106)
Creatinine, Ser: 0.87 mg/dL (ref 0.57–1.00)
Globulin, Total: 3.1 g/dL (ref 1.5–4.5)
Glucose: 100 mg/dL — ABNORMAL HIGH (ref 65–99)
Potassium: 3.9 mmol/L (ref 3.5–5.2)
Sodium: 138 mmol/L (ref 134–144)
Total Protein: 7.4 g/dL (ref 6.0–8.5)
eGFR: 83 mL/min/{1.73_m2} (ref >59)

## 2020-05-27 LAB — LDL CHOLESTEROL, DIRECT: LDL Direct: 110 mg/dL — ABNORMAL HIGH (ref 0–99)

## 2020-05-27 NOTE — Assessment & Plan Note (Signed)
Good control Discussed OTC meds for nasal congestion in setting of HTN Labs today Continue current meds

## 2020-05-27 NOTE — Assessment & Plan Note (Signed)
Seems to be doing well on current regimen and with LSM. Continue famotidin. Needs new GI doctor as hers no longer takes her insurance

## 2020-05-27 NOTE — Progress Notes (Signed)
    CHIEF COMPLAINT / HPI:  1. F/u chronic abdominal pain---better. Her previous GI doctor no longer takes Medicaid 2. Screening for colon cancer: uunable to set up secondary to #1 above 3. Having a lot of nasal congestion last 2 weeks. Not seeming to get better. No fever. Out of her flonase. Lots of post nasal drip. Sinus pressure but no pain Drainage causing some cough, nonproductive 4. F/u  htn   PERTINENT  PMH / PSH: I have reviewed the patient's medications, allergies, past medical and surgical history, smoking status and updated in the EMR as appropriate.   OBJECTIVE:  BP 124/82   Pulse 88   Ht 5\' 5"  (1.651 m)   Wt 246 lb 9.6 oz (111.9 kg)   LMP 08/31/2018   SpO2 99%   BMI 41.04 kg/m  GEN WD WN NAD HEENT: nasal mucosa is red, inflamed. OP red, no exudate. No LAD in neck CV RRR LUNGS CTA bilaterally,  No rales, no wheeze  ASSESSMENT / PLAN:   HTN (hypertension) Good control Discussed OTC meds for nasal congestion in setting of HTN Labs today Continue current meds  Seasonal allergies Refilled flonase and contine cetirizine  Abdominal pain, chronic, epigastric Seems to be doing well on current regimen and with LSM. Continue famotidin. Needs new GI doctor as hers no longer takes her insurance  Healthcare maintenance Ask referral for screening colonoscopy Hopefully could also be long term doc for her chronic I issues   [name] MD

## 2020-05-27 NOTE — Assessment & Plan Note (Signed)
Ask referral for screening colonoscopy Hopefully could also be long term doc for her chronic I issues

## 2020-05-27 NOTE — Assessment & Plan Note (Signed)
Refilled flonase and contine cetirizine

## 2020-06-03 ENCOUNTER — Encounter: Payer: Self-pay | Admitting: *Deleted

## 2020-06-18 ENCOUNTER — Other Ambulatory Visit: Payer: Self-pay | Admitting: Family Medicine

## 2020-06-29 ENCOUNTER — Other Ambulatory Visit: Payer: Self-pay | Admitting: Family Medicine

## 2020-07-05 ENCOUNTER — Telehealth: Payer: Self-pay

## 2020-07-05 DIAGNOSIS — Z1211 Encounter for screening for malignant neoplasm of colon: Secondary | ICD-10-CM

## 2020-07-05 NOTE — Telephone Encounter (Signed)
Patient calls nurse line with two requests. Patient would like to check status of referral for colonoscopy through [facility] insurance. I am unable to find where this referral was placed.   Patient also states that she needs a new [place] form completed for continuance of PCS services through [facility]. Form placed in provider box for completion. Once completed form will need to be faxed to [phone]  Talbot Grumbling, RN

## 2020-07-06 NOTE — Telephone Encounter (Signed)
Called pt. Went straight to [employer]. LM saying referral has been placed and she can call and schedule an appointment. [name], CMA

## 2020-07-06 NOTE — Telephone Encounter (Signed)
Referral  has been placed Please let her know THANKS! Holly Mendez

## 2020-07-08 NOTE — Telephone Encounter (Signed)
Patient is calling to let us know that [facility] told her they could not accept the medicaid card she has. She would like to know if there is somewhere else she could go.  Please call patient to discuss.

## 2020-07-13 ENCOUNTER — Encounter: Payer: Self-pay | Admitting: Family Medicine

## 2020-07-13 NOTE — Progress Notes (Signed)
Filled out form for PCS. Reading last notes she may or may not have medicaid currently. Gve form to staff to fax.

## 2020-07-14 NOTE — Telephone Encounter (Signed)
PCS forms faxed to [phone].   Talbot Grumbling, RN

## 2020-07-14 NOTE — Telephone Encounter (Signed)
Referral placed back in [place] with a note letting them know that patient uses her regular medicaid for provider visits/referrals.  Holly Mendez is only for behavior related visits.  They will call patient for an appt [name],CMA

## 2020-07-28 ENCOUNTER — Other Ambulatory Visit: Payer: Self-pay | Admitting: Family Medicine

## 2020-08-11 ENCOUNTER — Other Ambulatory Visit: Payer: Self-pay | Admitting: Family Medicine

## 2020-08-23 ENCOUNTER — Other Ambulatory Visit: Payer: Self-pay | Admitting: Family Medicine

## 2020-09-21 IMAGING — CT CT ABDOMEN AND PELVIS WITH CONTRAST
2 of 5 series · 17 of 46 positions shown, 19 images · IV contrast (agent unspecified)
Comparison: Plain film from earlier in the same day.

CLINICAL DATA: Nausea and vomiting

EXAM:
CT ABDOMEN AND PELVIS WITH CONTRAST
TECHNIQUE: Multidetector CT imaging of the abdomen and pelvis was performed
using the standard protocol following bolus administration of
intravenous contrast.
CONTRAST:  80mL [id number]

[Series 3: a/p w/ 5mm · axial · 0.98mm/px · z∈[+826,+1251]mm · 14 of 96 slices shown, 16 images]
[im 6/96  soft-tissue]
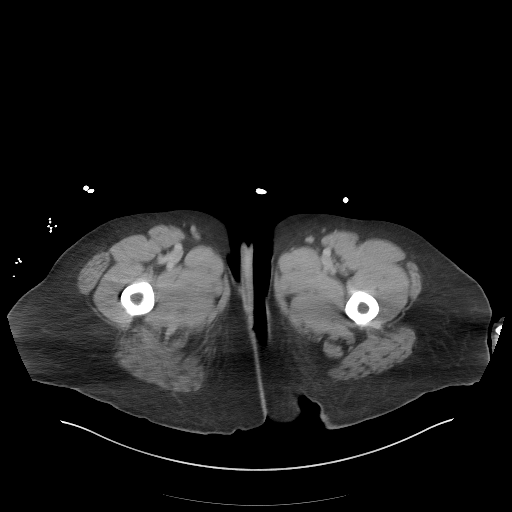
[im 6/96  bone]
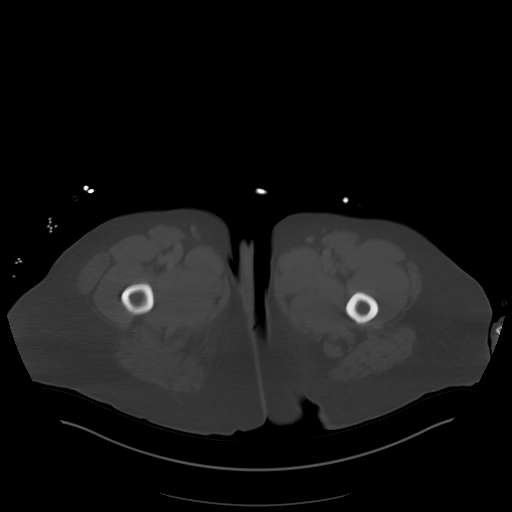
[im 11/96  soft-tissue]
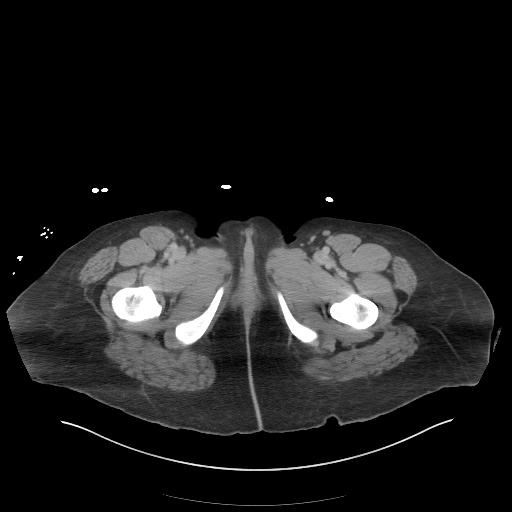
[im 21/96  soft-tissue]
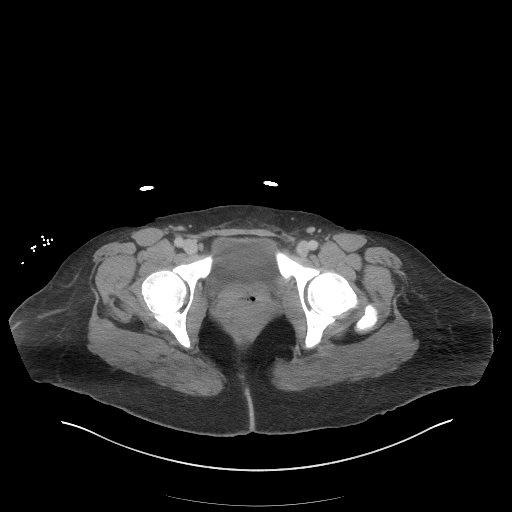
[im 26/96  soft-tissue]
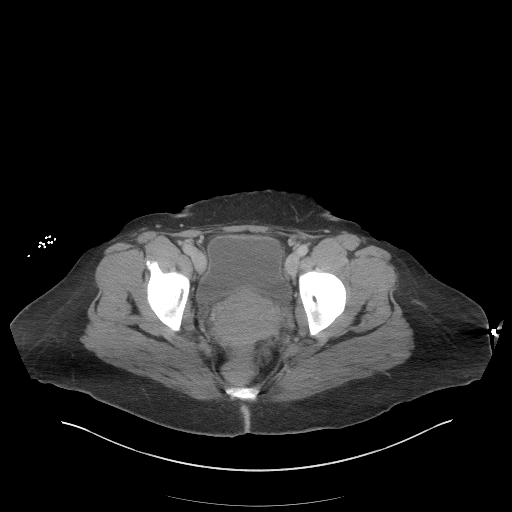
[im 31/96  soft-tissue]
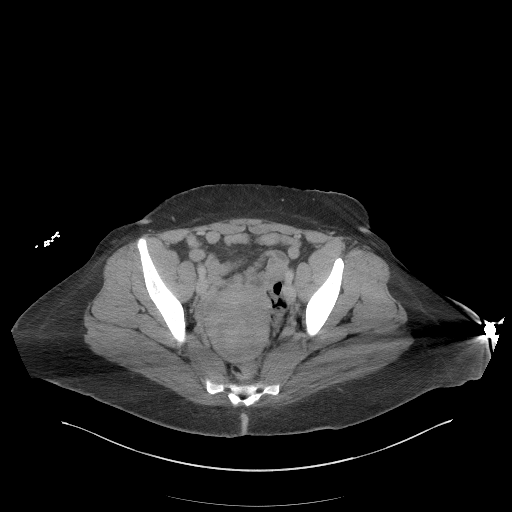
[im 41/96  soft-tissue]
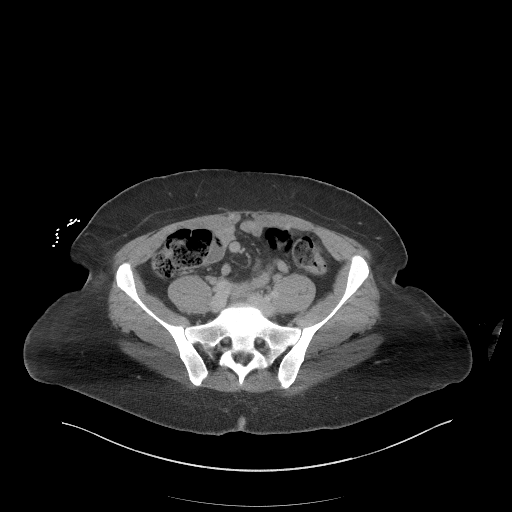
[im 46/96  soft-tissue]
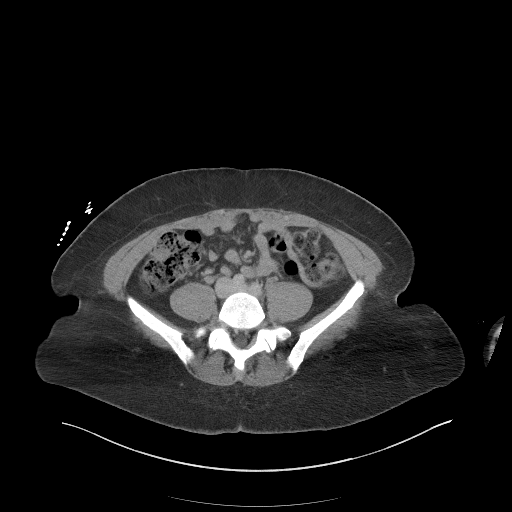
[im 51/96  soft-tissue]
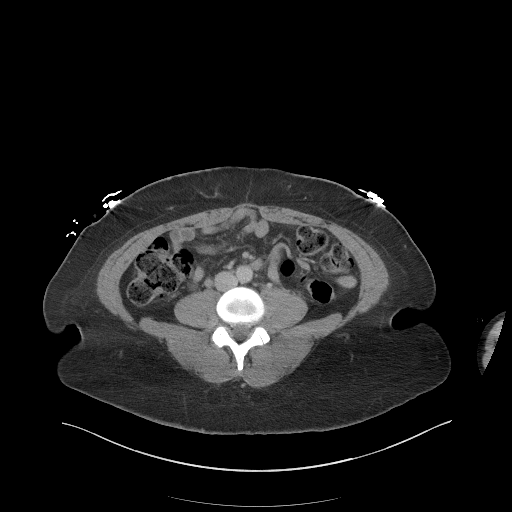
[im 56/96  soft-tissue]
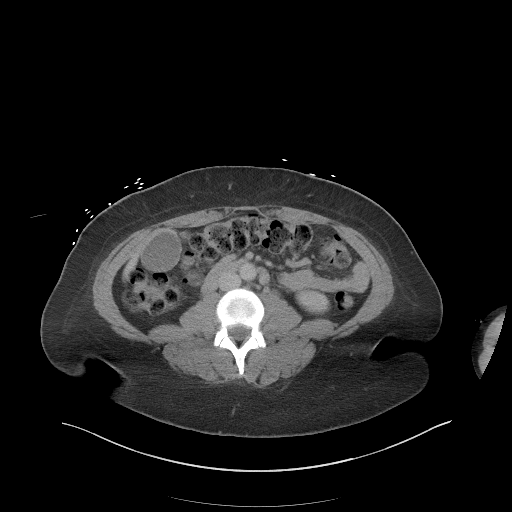
[im 56/96  bone]
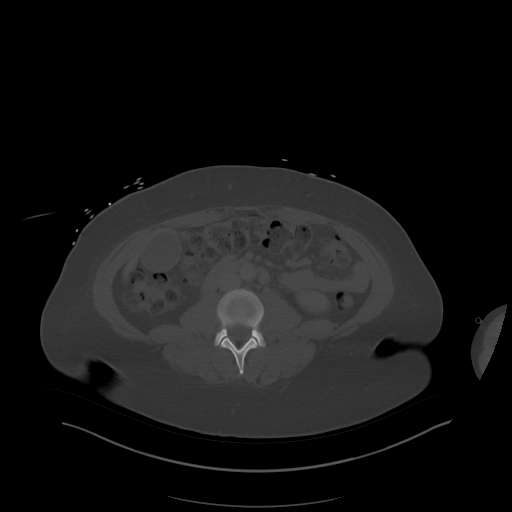
[im 66/96  soft-tissue]
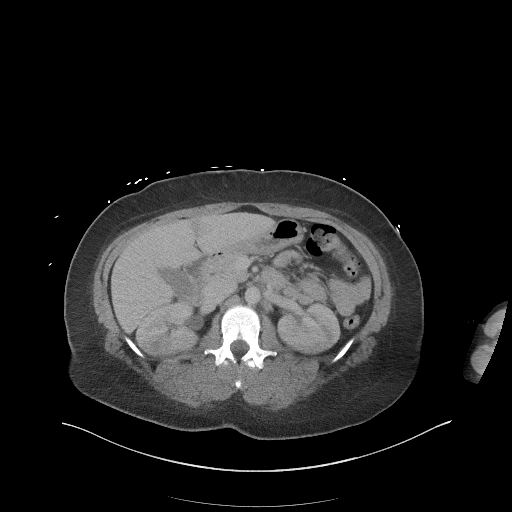
[im 71/96  soft-tissue]
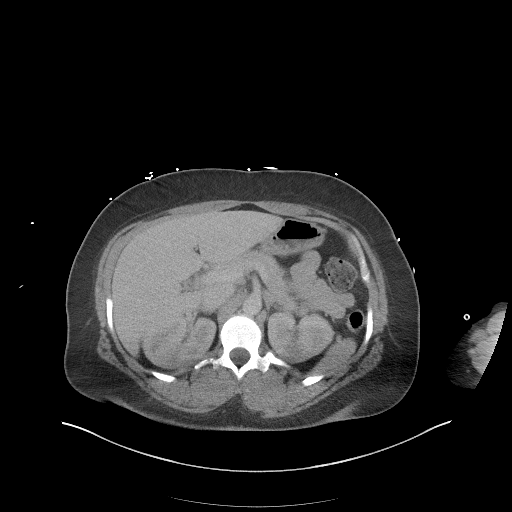
[im 76/96  soft-tissue]
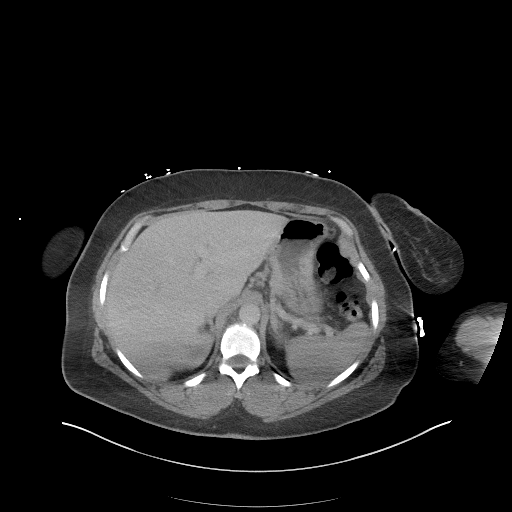
[im 86/96  soft-tissue]
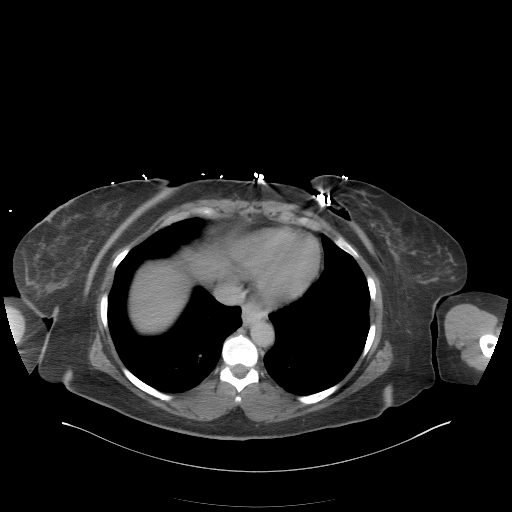
[im 91/96  soft-tissue]
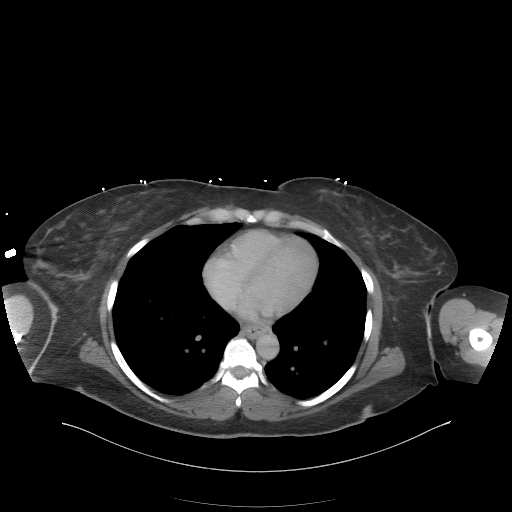

[Series 6: a/p w/ cor · coronal · 1.01mm/px · 3 of 178 slices shown]
[im 60/178  soft-tissue]
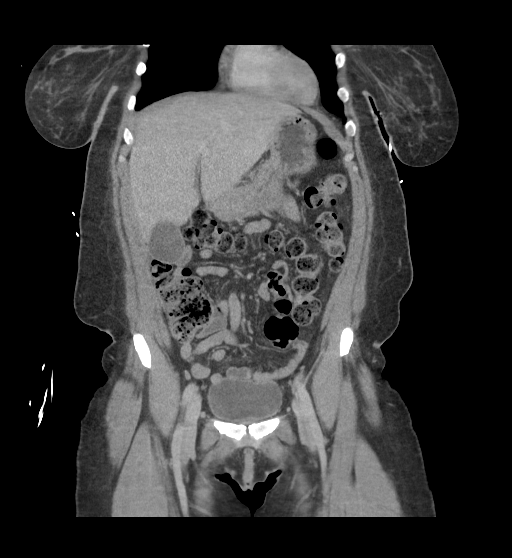
[im 79/178  soft-tissue]
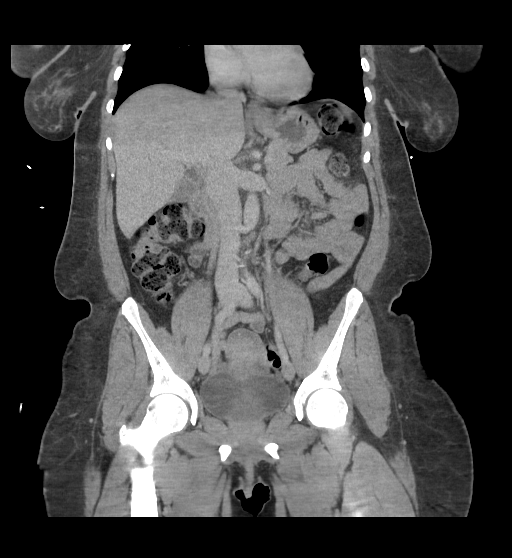
[im 99/178  soft-tissue]
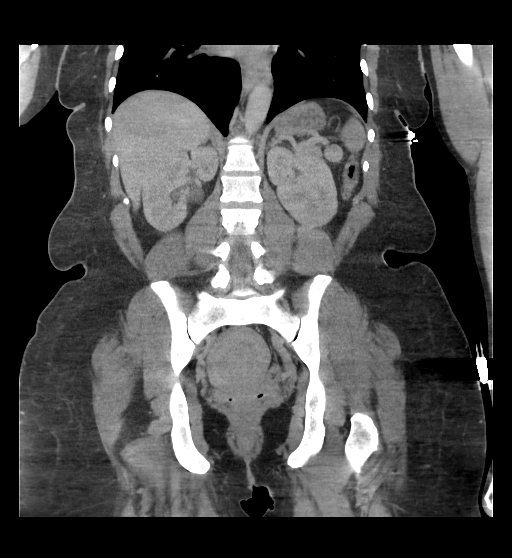

[17 of 46 positions shown; findings below may reference images not displayed]

FINDINGS: Lower chest: No acute abnormality.

Hepatobiliary: No focal liver abnormality is seen. No gallstones,
gallbladder wall thickening, or biliary dilatation.

Pancreas: Unremarkable. No pancreatic ductal dilatation or
surrounding inflammatory changes.

Spleen: Normal in size without focal abnormality.

Adrenals/Urinary Tract: Adrenal glands are within normal limits.
Kidneys are well visualized bilaterally. The bladder is partially
distended.

Stomach/Bowel: The appendix is within normal limits. No obstructive
or inflammatory changes of large or small bowel are seen. Small
sliding-type hiatal hernia is noted.

Vascular/Lymphatic: No significant vascular findings are present. No
enlarged abdominal or pelvic lymph nodes.

Reproductive: Uterus is prominent with fibroid change similar to
that seen on prior ultrasound examinations.

Other: No abdominal wall hernia or abnormality. No abdominopelvic
ascites.

Musculoskeletal: No acute bony abnormality noted.
IMPRESSION: Fibroid uterus.

Small hiatal hernia.

No other focal abnormality is seen.

## 2020-10-23 ENCOUNTER — Other Ambulatory Visit: Payer: Self-pay | Admitting: Family Medicine

## 2020-10-27 ENCOUNTER — Ambulatory Visit: Payer: Medicaid Other | Admitting: Family Medicine

## 2020-10-27 ENCOUNTER — Telehealth: Payer: Self-pay | Admitting: *Deleted

## 2020-10-27 DIAGNOSIS — G8929 Other chronic pain: Secondary | ICD-10-CM

## 2020-10-27 DIAGNOSIS — M47817 Spondylosis without myelopathy or radiculopathy, lumbosacral region: Secondary | ICD-10-CM

## 2020-10-27 NOTE — Telephone Encounter (Signed)
Patient is calling to see if she can get a pain referral to go back to [facility].  She missed her appt today due to transportation and will make one if needed but wanted to inquire about the referral first. Will forward to MD and then I will call patient with an update.  Hillel Card,CMA

## 2020-10-28 NOTE — Telephone Encounter (Signed)
Referral will be faxed to [facility] once it has been placed by provider.  Nhan Qualley,CMA

## 2020-10-28 NOTE — Telephone Encounter (Signed)
Patient called wanting to check the status of referral and to see if she was needing to have an appointment or not. I told her someone would let her know. Thanks!

## 2020-11-02 ENCOUNTER — Encounter: Payer: Self-pay | Admitting: Gastroenterology

## 2020-11-09 ENCOUNTER — Other Ambulatory Visit: Payer: Self-pay | Admitting: Family Medicine

## 2020-11-10 ENCOUNTER — Ambulatory Visit: Payer: Medicaid Other | Admitting: Family Medicine

## 2020-11-24 ENCOUNTER — Ambulatory Visit: Payer: Medicaid Other | Admitting: Family Medicine

## 2020-12-23 ENCOUNTER — Telehealth: Payer: Self-pay | Admitting: *Deleted

## 2020-12-23 NOTE — Telephone Encounter (Signed)
Unable to reach patient for virtual pre visit. Requested return phone call by 5 pm to avoid cancellation of colonoscopy 01/06/21.

## 2020-12-27 ENCOUNTER — Ambulatory Visit (AMBULATORY_SURGERY_CENTER): Payer: Medicaid Other | Admitting: *Deleted

## 2020-12-27 ENCOUNTER — Other Ambulatory Visit: Payer: Self-pay

## 2020-12-27 VITALS — Ht 65.0 in | Wt 251.0 lb

## 2020-12-27 DIAGNOSIS — Z1211 Encounter for screening for malignant neoplasm of colon: Secondary | ICD-10-CM

## 2020-12-27 MED ORDER — NA SULFATE-K SULFATE-MG SULF 17.5-3.13-1.6 GM/177ML PO SOLN
1.0000 | ORAL | 0 refills | Status: DC
Start: 2020-12-27 — End: 2022-10-25

## 2020-12-27 NOTE — Progress Notes (Signed)
Patient's pre-visit was done today over the phone with the patient. Name,DOB and address verified. Patient denies any allergies to Eggs and Soy. Patient denies any problems with anesthesia/sedation. Patient is not taking any diet pills or blood thinners. No home Oxygen. Packet of Prep instructions mailed to patient including a copy of a consent form-pt is aware. Prep instructions sent to pt's MyChart (if activated).Patient understands to call us back with any questions or concerns. Patient is aware of our care-partner policy and [id number] safety protocol.  The patient is COVID-19 vaccinated.

## 2021-01-04 ENCOUNTER — Telehealth: Payer: Self-pay | Admitting: Gastroenterology

## 2021-01-04 NOTE — Telephone Encounter (Signed)
Please contact patient with the following information:  1 -the Suprep prescribed for her bowel preparation cost $3 on Medicaid, and there is no less expensive option for prep than that.  2 -she should contact us when she feels ready to reschedule the colonoscopy, and must be sure that it is on a day when a care partner/ride will be available.  - H. Danis

## 2021-01-04 NOTE — Telephone Encounter (Signed)
Called patient and informed of prep cost and about rescheduling procedure.

## 2021-01-04 NOTE — Telephone Encounter (Signed)
Good morning Dr. Loletha Carrow, patient called stating she cannot afford prep medication.  Also does not have transportation so she canceled procedure scheduled for 01/06/21.

## 2021-01-06 ENCOUNTER — Encounter: Payer: Medicaid Other | Admitting: Gastroenterology

## 2021-02-07 ENCOUNTER — Other Ambulatory Visit: Payer: Self-pay | Admitting: Family Medicine

## 2021-04-11 ENCOUNTER — Other Ambulatory Visit: Payer: Self-pay | Admitting: Family Medicine

## 2021-04-25 ENCOUNTER — Telehealth: Payer: Self-pay

## 2021-04-25 NOTE — Telephone Encounter (Signed)
Patient calls nurse line reporting she was scratched by a cat over the weekend.  ? ?Patient reports she was at an [employer] and the cat she was holding got "spooked" and scratched her several times.  ? ?Patient reports the cat did break the skin, however no signs of infection at this time. The cat is UTD with vaccines and patient is UTD with tetanus (2017.) ? ?Patient scheduled for tomorrow for evaluation.  ? ?Precautions given in the meantime.  ?

## 2021-04-26 ENCOUNTER — Ambulatory Visit: Payer: Medicaid Other | Admitting: Family Medicine

## 2021-05-09 ENCOUNTER — Other Ambulatory Visit: Payer: Self-pay | Admitting: Family Medicine

## 2021-05-28 ENCOUNTER — Other Ambulatory Visit: Payer: Self-pay | Admitting: Family Medicine

## 2021-07-07 ENCOUNTER — Other Ambulatory Visit: Payer: Self-pay

## 2021-07-08 MED ORDER — ESOMEPRAZOLE MAGNESIUM 40 MG PO CPDR: 40.0000 mg | DELAYED_RELEASE_CAPSULE | Freq: Every day | ORAL | 0 refills | Status: DC

## 2021-07-08 MED ORDER — POLYETHYLENE GLYCOL 3350 17 GM/SCOOP PO POWD: ORAL | 2 refills | Status: DC

## 2021-07-11 ENCOUNTER — Other Ambulatory Visit: Payer: Self-pay | Admitting: Family Medicine

## 2021-07-20 ENCOUNTER — Other Ambulatory Visit: Payer: Self-pay | Admitting: Family Medicine

## 2021-07-27 ENCOUNTER — Ambulatory Visit: Payer: Medicaid Other | Admitting: Family Medicine

## 2021-08-11 ENCOUNTER — Ambulatory Visit
Admission: RE | Admit: 2021-08-11 | Discharge: 2021-08-11 | Disposition: A | Discharge status: Home / Self Care | Payer: Medicaid Other | Source: Ambulatory Visit | Attending: Family Medicine | Admitting: Family Medicine

## 2021-08-11 ENCOUNTER — Encounter: Payer: Self-pay | Admitting: Family Medicine

## 2021-08-11 ENCOUNTER — Ambulatory Visit (INDEPENDENT_AMBULATORY_CARE_PROVIDER_SITE_OTHER): Payer: Medicaid Other | Admitting: Family Medicine

## 2021-08-11 ENCOUNTER — Other Ambulatory Visit: Payer: Self-pay

## 2021-08-11 DIAGNOSIS — M25562 Pain in left knee: Secondary | ICD-10-CM
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
## 2021-08-11 NOTE — Patient Instructions (Signed)
I am worried about underlying arthritis that was irritated by the injury.  We shall see. Try using your cane. I will call with the results of the Xray and we can talk about treatments and next steps.

## 2021-08-12 ENCOUNTER — Encounter: Payer: Self-pay | Admitting: Family Medicine

## 2021-08-12 MED ORDER — MELOXICAM 7.5 MG PO TABS: 7.5000 mg | ORAL_TABLET | Freq: Every day | ORAL | 0 refills | Status: DC

## 2021-08-12 MED ORDER — DICLOFENAC SODIUM 1 % EX GEL: 2.0000 g | Freq: Four times a day (QID) | CUTANEOUS | 3 refills | Status: DC

## 2021-08-12 MED ORDER — CLONIDINE 0.1 MG/24HR TD PTWK: MEDICATED_PATCH | TRANSDERMAL | 3 refills | Status: DC

## 2021-08-12 NOTE — Assessment & Plan Note (Signed)
Suspect underlying arthritis.  Might need knee aspiration to R/O crystal arthropathy (gout, pseudogout) FU 2-3 weeks Dr. Nori Riis
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
## 2021-08-24 ENCOUNTER — Other Ambulatory Visit: Payer: Self-pay | Admitting: Family Medicine

## 2021-08-24 DIAGNOSIS — Z1231 Encounter for screening mammogram for malignant neoplasm of breast: Secondary | ICD-10-CM

## 2021-08-25 ENCOUNTER — Telehealth: Payer: Self-pay

## 2021-08-25 NOTE — Telephone Encounter (Signed)
Patient calls nurse line regarding continued pain in left knee. Reports that she has been taking meloxicam since 8/3 and has not had any improvement with pain.   She denies any adverse side effects or GI upset with medication.   She says that Dr. Andria Frames told her to call back in two weeks if she did not have any improvement and ask for alternative medication from PCP.   Patient has follow up with PCP on 8/30.   Please advise.   Talbot Grumbling, RN

## 2021-08-26 NOTE — Telephone Encounter (Signed)
Ok  Will see her at scheduled appt and consider injection. She can add ice or warm compresses until then  Holly Mendez

## 2021-08-26 NOTE — Telephone Encounter (Signed)
Pt informed to use ice or warm compresses. [name], CMA

## 2021-08-31 ENCOUNTER — Other Ambulatory Visit: Payer: Self-pay | Admitting: Family Medicine

## 2021-09-07 ENCOUNTER — Encounter: Payer: Self-pay | Admitting: Family Medicine

## 2021-09-07 ENCOUNTER — Ambulatory Visit (INDEPENDENT_AMBULATORY_CARE_PROVIDER_SITE_OTHER): Payer: Medicaid Other | Admitting: Family Medicine

## 2021-09-07 VITALS — BP 149/92 | HR 95 | Ht 65.0 in | Wt 269.8 lb

## 2021-09-07 DIAGNOSIS — S838X2A Sprain of other specified parts of left knee, initial encounter: Secondary | ICD-10-CM | POA: Diagnosis not present

## 2021-09-07 MED ORDER — BUDESONIDE-FORMOTEROL FUMARATE 80-4.5 MCG/ACT IN AERO: INHALATION_SPRAY | RESPIRATORY_TRACT | 12 refills | Status: DC

## 2021-09-07 MED ORDER — ALBUTEROL SULFATE HFA 108 (90 BASE) MCG/ACT IN AERS: INHALATION_SPRAY | RESPIRATORY_TRACT | 12 refills | Status: DC

## 2021-09-07 MED ORDER — FLUTICASONE PROPIONATE 50 MCG/ACT NA SUSP
2.0000 | Freq: Every day | NASAL | 12 refills | Status: DC
Start: 2021-09-07 — End: 2022-08-02

## 2021-09-07 NOTE — Patient Instructions (Signed)
I am concerned you have a meniscal injury in your left knee.  I have set up an MRI for you.  I will call you within 2 working days of getting the MRI.  If you have not heard from me in that time period, please give my office a call.  I have sent in your refills.  Please schedule appointment sometime in the next 2 or 3 months for a well health check.  Great to see you again!

## 2021-09-08 ENCOUNTER — Ambulatory Visit: Payer: Medicaid Other

## 2021-09-08 DIAGNOSIS — S838X2A Sprain of other specified parts of left knee, initial encounter: Secondary | ICD-10-CM | POA: Insufficient documentation

## 2021-09-08 DIAGNOSIS — Z96651 Presence of right artificial knee joint: Secondary | ICD-10-CM
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
## 2021-09-08 NOTE — Assessment & Plan Note (Signed)
>>  ASSESSMENT AND PLAN FOR INJURY OF MENISCUS OF LEFT KNEE WRITTEN ON 09/08/2021 10:12 AM BY Carlie Solorzano L, MD  Seems classic clinical presentation for meniscal injury.  X-rays were negative.  At this point, 14 months out from the injury, I think we need to get advanced imaging we will set up for MRI.  I will contact her after we get the results of that.

## 2021-09-08 NOTE — Assessment & Plan Note (Signed)
Seems classic clinical presentation for meniscal injury.  X-rays were negative.  At this point, 14 months out from the injury, I think we need to get advanced imaging we will set up for MRI.  I will contact her after we get the results of that.

## 2021-09-23 ENCOUNTER — Ambulatory Visit (HOSPITAL_COMMUNITY)
Admission: RE | Admit: 2021-09-23 | Discharge: 2021-09-23 | Disposition: A | Discharge status: Home / Self Care | Payer: Medicaid Other | Source: Ambulatory Visit | Attending: Family Medicine | Admitting: Family Medicine

## 2021-09-23 DIAGNOSIS — S838X2A Sprain of other specified parts of left knee, initial encounter: Secondary | ICD-10-CM

## 2021-09-24 ENCOUNTER — Other Ambulatory Visit: Payer: Self-pay | Admitting: Family Medicine

## 2021-09-26 ENCOUNTER — Telehealth: Payer: Self-pay | Admitting: Family Medicine

## 2021-09-26 DIAGNOSIS — M23209 Derangement of unspecified meniscus due to old tear or injury, unspecified knee: Secondary | ICD-10-CM

## 2021-09-26 DIAGNOSIS — M25562 Pain in left knee: Secondary | ICD-10-CM

## 2021-09-26 MED ORDER — HYDROCODONE-ACETAMINOPHEN 5-325 MG PO TABS: ORAL_TABLET | ORAL | 0 refills | Status: DC

## 2021-09-26 NOTE — Telephone Encounter (Signed)
Spoke w patient and have sent referral for ortho

## 2021-09-26 NOTE — Addendum Note (Signed)
Addended byDorcas Mcmurray L on: 09/26/2021 04:42 PM   Modules accepted: Orders

## 2021-09-26 NOTE — Telephone Encounter (Signed)
Patient is calling and would like for Dr. Nori Riis to call her to discuss MRI results when they are available.   The best call back number is [phone]

## 2021-09-27 ENCOUNTER — Telehealth: Payer: Self-pay

## 2021-09-27 ENCOUNTER — Other Ambulatory Visit (HOSPITAL_COMMUNITY): Payer: Self-pay

## 2021-09-27 NOTE — Telephone Encounter (Signed)
Prior Auth is needed on Hydrocodone.   Will forward to [place] to initiate PA.

## 2021-09-28 ENCOUNTER — Other Ambulatory Visit (HOSPITAL_COMMUNITY): Payer: Self-pay

## 2021-09-28 NOTE — Telephone Encounter (Signed)
Prior Auth for patients medication HYDROCODONE/ACETAMINOPHEN approved by MEDICAID from 09/28/21 to 12/27/21.  Patient aware.

## 2021-09-28 NOTE — Telephone Encounter (Signed)
A Prior Authorization was initiated for this patients NORCO through CoverMyMeds.   CONFIRMATION #: [id number] W  [place] ID: [id number] Q

## 2021-10-06 ENCOUNTER — Inpatient Hospital Stay: Admission: RE | Admit: 2021-10-06 | Payer: Medicaid Other | Source: Ambulatory Visit

## 2021-10-11 ENCOUNTER — Other Ambulatory Visit: Payer: Self-pay | Admitting: Family Medicine

## 2021-10-12 ENCOUNTER — Ambulatory Visit (INDEPENDENT_AMBULATORY_CARE_PROVIDER_SITE_OTHER): Payer: Medicaid Other | Admitting: Orthopaedic Surgery

## 2021-10-12 VITALS — Ht 65.0 in | Wt 273.0 lb

## 2021-10-12 DIAGNOSIS — M25562 Pain in left knee: Secondary | ICD-10-CM

## 2021-10-12 DIAGNOSIS — G8929 Other chronic pain: Secondary | ICD-10-CM

## 2021-10-12 MED ORDER — METHYLPREDNISOLONE ACETATE 40 MG/ML IJ SUSP
40.0000 mg | INTRAMUSCULAR | Status: AC | PRN
  Administered 2021-10-12: 40 mg via INTRA_ARTICULAR

## 2021-10-12 MED ORDER — LIDOCAINE HCL 1 % IJ SOLN
3.0000 mL | INTRAMUSCULAR | Status: AC | PRN
  Administered 2021-10-12: 3 mL
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
## 2021-10-24 ENCOUNTER — Other Ambulatory Visit: Payer: Self-pay | Admitting: Family Medicine

## 2021-10-25 MED ORDER — POLYETHYLENE GLYCOL 3350 17 GM/SCOOP PO POWD: ORAL | 2 refills | Status: DC

## 2021-10-25 MED ORDER — CETIRIZINE HCL 10 MG PO TABS: 10.0000 mg | ORAL_TABLET | Freq: Every day | ORAL | 11 refills | Status: DC

## 2021-10-26 ENCOUNTER — Encounter: Payer: Medicaid Other | Admitting: Family Medicine

## 2021-10-28 ENCOUNTER — Other Ambulatory Visit (HOSPITAL_COMMUNITY): Payer: Self-pay | Admitting: Family Medicine

## 2021-10-28 DIAGNOSIS — Z1231 Encounter for screening mammogram for malignant neoplasm of breast: Secondary | ICD-10-CM

## 2021-10-31 ENCOUNTER — Inpatient Hospital Stay (HOSPITAL_COMMUNITY): Admission: RE | Admit: 2021-10-31 | Payer: Medicaid Other | Source: Ambulatory Visit

## 2021-11-07 ENCOUNTER — Other Ambulatory Visit: Payer: Self-pay | Admitting: Family Medicine

## 2021-11-07 ENCOUNTER — Other Ambulatory Visit: Payer: Self-pay

## 2021-11-10 ENCOUNTER — Ambulatory Visit (HOSPITAL_COMMUNITY)
Admission: RE | Admit: 2021-11-10 | Discharge: 2021-11-10 | Disposition: A | Discharge status: Home / Self Care | Payer: Medicaid Other | Source: Ambulatory Visit | Attending: Family Medicine | Admitting: Family Medicine

## 2021-11-10 DIAGNOSIS — Z1231 Encounter for screening mammogram for malignant neoplasm of breast: Secondary | ICD-10-CM | POA: Diagnosis present

## 2021-11-29 ENCOUNTER — Other Ambulatory Visit: Payer: Self-pay | Admitting: Family Medicine

## 2021-12-05 ENCOUNTER — Telehealth: Payer: Self-pay

## 2021-12-05 NOTE — Telephone Encounter (Signed)
Patient calls nurse line in regards to Hydrocodone.   Patient reports the drug store has not received a response yet.   I advised patient the request was denied. Unsure if the medication is too early to fill or if PCP is no longer filling.   She reports she is unable to see Dr. Philipp Ovens due to her living outside of [facility].   Will forward to PCP.

## 2021-12-06 NOTE — Telephone Encounter (Signed)
Spoke to pt. She is living in [facility] and does not have transportation to get to [place] for an appt. Pt is asking for a video visit. Please advise.   Holly Mendez, CMA

## 2021-12-06 NOTE — Telephone Encounter (Signed)
Dear [facility] Team She needs an appointment with me or someone here as continiung chronic vicodin is not the best option for her. THANKS! [name]

## 2021-12-08 ENCOUNTER — Telehealth (INDEPENDENT_AMBULATORY_CARE_PROVIDER_SITE_OTHER): Payer: Medicaid Other | Admitting: Family Medicine

## 2021-12-08 DIAGNOSIS — S838X2D Sprain of other specified parts of left knee, subsequent encounter: Secondary | ICD-10-CM

## 2021-12-08 MED ORDER — HYDROCODONE-ACETAMINOPHEN 5-325 MG PO TABS: 1.0000 | ORAL_TABLET | Freq: Two times a day (BID) | ORAL | 0 refills | Status: DC | PRN

## 2021-12-08 NOTE — Assessment & Plan Note (Signed)
>>  ASSESSMENT AND PLAN FOR INJURY OF MENISCUS OF LEFT KNEE WRITTEN ON 12/08/2021 11:18 AM BY Itati Brocksmith L, MD  Reading Dr. Arvella Bird note, it seems that the meniscal injury is not a specific tear but rather more degenerative in nature.  He did consider possibly doing arthroscope if the steroid injection did not help her.  The injection did seem to help at least initially but now she is having more pain.  We discussed in social factors are really working against her she does not have transportation and she is living relatively far away from the office with a family member.  We discussed that long-term opioid therapy is not ideal.  I will give her #60 Vicodin with the plan that she is to schedule an appointment with Dr. Lucienne Ryder within the next 6 weeks.  I would like to see her either in virtual appointment or in the office also in the next 6 weeks.  She is agreeable to this plan.  She is aware that this is not going to be the long-term treatment for her knee issues.  Hopefully we can get her over the next few weeks until she can get back in with the orthopedist.
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
## 2021-12-08 NOTE — Assessment & Plan Note (Signed)
Reading Dr. Trevor Mace note, it seems that the meniscal injury is not a specific tear but rather more degenerative in nature.  He did consider possibly doing arthroscope if the steroid injection did not help her.  The injection did seem to help at least initially but now she is having more pain.  We discussed in social factors are really working against her she does not have transportation and she is living relatively far away from the office with a family member.  We discussed that long-term opioid therapy is not ideal.  I will give her #60 Vicodin with the plan that she is to schedule an appointment with Dr. Ninfa Linden within the next 6 weeks.  I would like to see her either in virtual appointment or in the office also in the next 6 weeks.  She is agreeable to this plan.  She is aware that this is not going to be the long-term treatment for her knee issues.  Hopefully we can get her over the next few weeks until she can get back in with the orthopedist.

## 2021-12-08 NOTE — Telephone Encounter (Signed)
Pt ok to a video visit. Has been scheduled.  [name], CMA

## 2021-12-25 ENCOUNTER — Telehealth: Payer: Self-pay | Admitting: Student

## 2021-12-25 NOTE — Telephone Encounter (Signed)
Believes she is going through menopause, waking up with pillow cases wet, she is feeling hot and having to shower 2-3x per night. Believes she may  Stuck her finger up her vagina and has cottage cheese coming out on her fingers. Reading menopause on google and all this comes up on there as symptoms. States she is always hot and can't get any rest during night time even though her AC is on all day. States even now she has Not sexually active.  She has been drinking water to stay hydrated.

## 2021-12-25 NOTE — Telephone Encounter (Signed)
Received after-hours page.  Called patient back and she notes that she recently believes she has been going through menopause.  She has been having shower 2-3 times per night because during the day and night she is having hot flashes and chills which she is waking up with the pillowcases wet.  She states she is always hot and cannot get any rest during the nighttime even though her AC is on all day.  She also notes that she has been having cottage cheese coming out of her vagina noted when she "stuck my finger up there" and denies being sexually active.  She has been drinking water to stay hydrated. She states she normally has video appointments with her PCP [name]) for any concerns she has due to being in [place].  She notes that she does have Rx at pharmacy for Flagyl.  I have instructed her that what she describes sounds more like a yeast infection and Flagyl may not be beneficial.  I recommend testing to which she was amenable.  Further discussed that should she be going through menopause, this would be most appropriate for an appointment reassured her that hydration was the appropriate step and may potentially introduce Tylenol for comfort and antipyretic measures.  Made appointment for 12/20 at 10:10 AM to which patient was thankful for conversation and call back.  Should she need a sooner appointment, she may call back tomorrow morning.

## 2021-12-27 ENCOUNTER — Telehealth: Payer: Self-pay | Admitting: Family Medicine

## 2021-12-27 NOTE — Telephone Encounter (Signed)
Patient is calling to see if it is possible her appointment can be virtual tomorrow 12/28/21. She states she has no transportation and that Dr. Nori Riis has offered her virtual in the past. I informed patient I would have to get it approved before changing appointment.   Please have someone call patient to let her know if this appointment can be changed to virtual.

## 2021-12-27 NOTE — Telephone Encounter (Signed)
Disregard message below. Patient just called back again and said her ride confirmed they would be able to bring her in tomorrow.

## 2021-12-28 ENCOUNTER — Encounter: Payer: Self-pay | Admitting: Family Medicine

## 2021-12-28 ENCOUNTER — Other Ambulatory Visit (HOSPITAL_COMMUNITY)
Admission: RE | Admit: 2021-12-28 | Discharge: 2021-12-28 | Disposition: A | Discharge status: Home / Self Care | Payer: Medicaid Other | Source: Ambulatory Visit | Attending: Family Medicine | Admitting: Family Medicine

## 2021-12-28 ENCOUNTER — Ambulatory Visit (INDEPENDENT_AMBULATORY_CARE_PROVIDER_SITE_OTHER): Payer: Medicaid Other | Admitting: Family Medicine

## 2021-12-28 VITALS — BP 106/62 | HR 88 | Ht 65.0 in | Wt 257.4 lb

## 2021-12-28 DIAGNOSIS — N898 Other specified noninflammatory disorders of vagina: Secondary | ICD-10-CM | POA: Insufficient documentation

## 2021-12-28 LAB — POCT WET PREP (WET MOUNT)
Clue Cells Wet Prep Whiff POC: POSITIVE
Trichomonas Wet Prep HPF POC: ABSENT
WBC, Wet Prep HPF POC: NONE SEEN

## 2021-12-28 MED ORDER — FLUCONAZOLE 150 MG PO TABS: ORAL_TABLET | ORAL | 2 refills | Status: DC

## 2021-12-28 MED ORDER — ESTRADIOL 1 MG PO TABS: 1.0000 mg | ORAL_TABLET | Freq: Every day | ORAL | 3 refills | Status: DC

## 2021-12-28 NOTE — Patient Instructions (Addendum)
I am sending in a prescription for hormone replacement tabs.  Take 1 a day.  Let me see you back in February.  If you miss a pill, do not worry about it.  I am also sending in a prescription of 3 pills of Diflucan.  You take 1 every other day for 3 doses for your yeast infection.  If I have any other results that need medication, I will call you.  Great to see you!  Me see you back in February.    Menopause Menopause is the normal time of a woman's life when menstrual periods stop completely. It marks the natural end to a woman's ability to become pregnant. It can be defined as the absence of a menstrual period for 12 months without another medical cause. The transition to menopause (perimenopause) most often happens between the ages of 78 and 35, and can last for many years. During perimenopause, hormone levels change in your body, which can cause symptoms and affect your health. Menopause may increase your risk for: Weakened bones (osteoporosis), which causes fractures. Depression. Hardening and narrowing of the arteries (atherosclerosis), which can cause heart attacks and strokes. What are the causes? This condition is usually caused by a natural change in hormone levels that happens as you get older. The condition may also be caused by changes that are not natural, including: Surgery to remove both ovaries (surgical menopause). Side effects from some medicines, such as chemotherapy used to treat cancer (chemical menopause). What increases the risk? This condition is more likely to start at an earlier age if you have certain medical conditions or have undergone treatments, including: A tumor of the pituitary gland in the brain. A disease that affects the ovaries and hormones. Certain cancer treatments, such as chemotherapy or hormone therapy, or radiation therapy on the pelvis. Heavy smoking and excessive alcohol use. Family history of early menopause. This condition is also more likely to  develop earlier in women who are very thin. What are the signs or symptoms? Symptoms of this condition include: Hot flashes. Irregular menstrual periods. Night sweats. Changes in feelings about sex. This could be a decrease in sex drive or an increased discomfort around your sexuality. Vaginal dryness and thinning of the vaginal walls. This may cause painful sex. Dryness of the skin and development of wrinkles. Headaches. Problems sleeping (insomnia). Mood swings or irritability. Memory problems. Weight gain. Hair growth on the face and chest. Bladder infections or problems with urinating. How is this diagnosed? This condition is diagnosed based on your medical history, a physical exam, your age, your menstrual history, and your symptoms. Hormone tests may also be done. How is this treated? In some cases, no treatment is needed. You and your health care provider should make a decision together about whether treatment is necessary. Treatment will be based on your individual condition and preferences. Treatment for this condition focuses on managing symptoms. Treatment may include:  Medicines to treat specific symptoms or complications. Acupuncture. Vitamin or herbal supplements. Before starting treatment, make sure to let your health care provider know if you have a personal or family history of these conditions: Heart disease. Breast cancer. Blood clots. Diabetes. Osteoporosis. Follow these instructions at home: Lifestyle Do not use any products that contain nicotine or tobacco, such as cigarettes, e-cigarettes, and chewing tobacco. If you need help quitting, ask your health care provider. Get at least 30 minutes of physical activity on 5 or more days each week. Avoid alcoholic and caffeinated beverages, as  well as spicy foods. This may help prevent hot flashes. Get 7-8 hours of sleep each night. If you have hot flashes, try: Dressing in layers. Avoiding things that may trigger  hot flashes, such as spicy food, warm places, or stress. Taking slow, deep breaths when a hot flash starts. Keeping a fan in your home and office. Find ways to manage stress, such as deep breathing, meditation, or journaling. Consider going to group therapy with other women who are having menopause symptoms. Ask your health care provider about recommended group therapy meetings. Eating and drinking  Eat a healthy, balanced diet that contains whole grains, lean protein, low-fat dairy, and plenty of fruits and vegetables. Your health care provider may recommend adding more soy to your diet. Foods that contain soy include tofu, tempeh, and soy milk. Eat plenty of foods that contain calcium and vitamin D for bone health. Items that are rich in calcium include low-fat milk, yogurt, beans, almonds, sardines, broccoli, and kale. Medicines Take over-the-counter and prescription medicines only as told by your health care provider. Talk with your health care provider before starting any herbal supplements. If prescribed, take vitamins and supplements as told by your health care provider. General instructions  Summary Menopause is a normal time of life when menstrual periods stop completely. It is usually defined as the absence of a menstrual period for 12 months without another medical cause. The transition to menopause (perimenopause) most often happens between the ages of 3 and 42 and can last for several years. Symptoms can be managed through medicines, lifestyle changes, and complementary therapies such as acupuncture. Eat a balanced diet that is rich in nutrients to promote bone health and heart health and to manage symptoms during menopause. This information is not intended to replace advice given to you by your health care provider. Make sure you discuss any questions you have with your health care provider. Document Revised: 09/26/2019 Document Reviewed: 06/12/2019 Elsevier Patient Education   [place].

## 2021-12-29 ENCOUNTER — Other Ambulatory Visit: Payer: Self-pay | Admitting: Family Medicine

## 2021-12-29 ENCOUNTER — Other Ambulatory Visit: Payer: Self-pay

## 2021-12-29 ENCOUNTER — Encounter: Payer: Self-pay | Admitting: Family Medicine

## 2021-12-29 NOTE — Progress Notes (Signed)
    CHIEF COMPLAINT / HPI: 2 weeks of vaginal discharge.  Itchy.  Discharge is thick.  She used a douche which seem to make it more irritated.  No new sexual partners.  No fever.  No abdominal pain.   PERTINENT  PMH / PSH: I have reviewed the patient's medications, allergies, past medical and surgical history, smoking status and updated in the EMR as appropriate.   OBJECTIVE:  BP 106/62   Pulse 88   Ht '5\' 5"'$  (1.651 m)   Wt 257 lb 6.4 oz (116.8 kg)   LMP 08/31/2018   SpO2 98%   BMI 42.83 kg/m  GENERAL: Well-developed female no acute distress GU: Externally normal genitalia.  Speculum exam reveals thick white cottage cheese type discharge.  Cervix looks normal without any sign of cervicitis.  Wet prep and STI testing obtained.  ASSESSMENT / PLAN: Vaginal discharge: Definitely has clinical appearance of yeast vaginitis so we will treat that.  Have sent testing for additional issues and will treat based on results.     [name] MD

## 2021-12-29 NOTE — Progress Notes (Signed)
Lab letter sent.  Will defer treatment for BV right now.  She still has symptoms after treatment for the yeast vaginitis, she will let me know and we will treat for then.

## 2021-12-30 LAB — CERVICOVAGINAL ANCILLARY ONLY
Chlamydia: NEGATIVE
Comment: NEGATIVE
Comment: NORMAL
Neisseria Gonorrhea: NEGATIVE

## 2021-12-30 MED ORDER — METRONIDAZOLE 500 MG PO TABS: ORAL_TABLET | ORAL | 0 refills | Status: DC

## 2022-01-05 ENCOUNTER — Other Ambulatory Visit: Payer: Self-pay | Admitting: Family Medicine

## 2022-01-11 ENCOUNTER — Telehealth: Payer: Self-pay

## 2022-01-11 NOTE — Telephone Encounter (Signed)
Medication approval via [employer] through 01/11/2022 - 07/10/2022.  Pharmacy and patient have been updated.

## 2022-01-11 NOTE — Telephone Encounter (Signed)
Prior auth submitted via [employer] for Hydrocodone-Acetaminophen.   Will recheck back in 24 hours for response.

## 2022-01-12 ENCOUNTER — Telehealth: Payer: Self-pay | Admitting: Family Medicine

## 2022-01-12 NOTE — Telephone Encounter (Signed)
Patient called stating that she was supposed to get a letter in the mail with the test results from her last visit, but she still hasn't received it. She would like for it to be re-sent please.

## 2022-01-16 ENCOUNTER — Telehealth: Payer: Self-pay

## 2022-01-16 NOTE — Telephone Encounter (Signed)
Patient calls nurse line requesting prescription for Zofran.   She reports nausea, vomiting and diarrhea for the last three days. She has been tolerating fluids, however, is not drinking her "normal amount" of water due to nausea.   Denies alcohol usage or fever. She reports that Dr. Nori Riis has sent her in Zofran in the past "when my stomach gets like this."  Discussed supportive measures and red flags.   Forwarding request to Dr. Nori Riis.   Talbot Grumbling, RN

## 2022-01-16 NOTE — Telephone Encounter (Signed)
Patient returns call to nurse line.   Please advise.   Talbot Grumbling, RN

## 2022-01-17 MED ORDER — ONDANSETRON HCL 4 MG PO TABS: 4.0000 mg | ORAL_TABLET | Freq: Three times a day (TID) | ORAL | 0 refills | Status: DC | PRN

## 2022-01-17 NOTE — Telephone Encounter (Signed)
Pt informed. Pt wants to let Dr. Nori Riis know that there is a message from Mr Vandalen as well. Pt just wants to make sure that Dr. Nori Riis knows to look for the message. [name], CMA

## 2022-01-17 NOTE — Telephone Encounter (Signed)
I sent in some zofran for her nausea. Please let her know THANKS! [name]

## 2022-01-22 ENCOUNTER — Telehealth: Payer: Self-pay | Admitting: Student

## 2022-01-22 NOTE — Telephone Encounter (Signed)
After Hours Call  Pt states for last two weeks has been nauseous and vomiting (2x/day). Dr. Nori Riis prescribed her zofran but it has not helped. Has not been able to keep food down for 5 days but is taking small sips of water. Complains of an aching abdominal pain which has worsened over past 4 days. She cannot sleep d/t the pain. She states she has hx significant for pancreatitis but this pain feels different. Denies feeling light headed, denies chest pain. States she has anxiety and in the past has had abdominal pain and nausea d/t stress but this is different because she usually doesn't vomit.   Pt advised to go the ED today rather than waiting for a clinic appointment tomorrow as she may needs IV fluids, she is likely dehydrated. Also needs to be evaluated for cause of her symptoms. Pt agrees and plans to go to [facility].

## 2022-01-23 ENCOUNTER — Ambulatory Visit (INDEPENDENT_AMBULATORY_CARE_PROVIDER_SITE_OTHER): Payer: Medicaid Other | Admitting: Orthopaedic Surgery

## 2022-01-23 DIAGNOSIS — G8929 Other chronic pain: Secondary | ICD-10-CM

## 2022-01-23 DIAGNOSIS — M25562 Pain in left knee: Secondary | ICD-10-CM | POA: Diagnosis not present
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
## 2022-02-06 ENCOUNTER — Telehealth: Payer: Self-pay

## 2022-02-06 ENCOUNTER — Other Ambulatory Visit: Payer: Self-pay | Admitting: Family Medicine

## 2022-02-06 NOTE — Telephone Encounter (Signed)
Patient LVM on nurse line requesting a refill on Hydrocodone.   She reports she did see Dr. Philipp Ovens and was given a knee injection, however she reports her knee has been locked up. She reports she believes its due to the colder weather.   She reports they did discuss surgery moving forward. She plans a soon follow up with him.   Will forward to PCP for advisement on prescription refill.

## 2022-02-07 NOTE — Telephone Encounter (Signed)
Rx refilled THANKS! Holly Mendez

## 2022-02-15 ENCOUNTER — Ambulatory Visit (INDEPENDENT_AMBULATORY_CARE_PROVIDER_SITE_OTHER): Payer: Medicaid Other | Admitting: Family Medicine

## 2022-02-15 ENCOUNTER — Encounter: Payer: Self-pay | Admitting: Family Medicine

## 2022-02-15 VITALS — BP 128/80 | HR 72 | Wt 253.4 lb

## 2022-02-15 DIAGNOSIS — Z113 Encounter for screening for infections with a predominantly sexual mode of transmission: Secondary | ICD-10-CM | POA: Diagnosis not present

## 2022-02-15 DIAGNOSIS — I10 Essential (primary) hypertension: Secondary | ICD-10-CM

## 2022-02-15 DIAGNOSIS — M25561 Pain in right knee: Secondary | ICD-10-CM

## 2022-02-15 MED ORDER — NARCAN 4 MG/0.1ML NA LIQD: 1.0000 | Freq: Every day | NASAL | 0 refills | Status: DC | PRN

## 2022-02-15 MED ORDER — CLONIDINE 0.1 MG/24HR TD PTWK: MEDICATED_PATCH | TRANSDERMAL | 3 refills | Status: DC

## 2022-02-15 MED ORDER — ESOMEPRAZOLE MAGNESIUM 40 MG PO CPDR: 40.0000 mg | DELAYED_RELEASE_CAPSULE | Freq: Every day | ORAL | 1 refills | Status: DC

## 2022-02-15 NOTE — Patient Instructions (Signed)
Today I injected your right knee.  I hope that improves things.  If you are not having significant improvement in the next month, please let me know.  I have sent in some refills.  We are checking some blood work and you can access that through [employer].  Great to see you!

## 2022-02-16 LAB — RPR: RPR Ser Ql: NONREACTIVE

## 2022-02-16 LAB — LIPID PANEL
Chol/HDL Ratio: 3.6 ratio (ref 0.0–4.4)
Cholesterol, Total: 206 mg/dL — ABNORMAL HIGH (ref 100–199)
HDL: 58 mg/dL (ref >39)
LDL Chol Calc (NIH): 125 mg/dL — ABNORMAL HIGH (ref 0–99)
Triglycerides: 128 mg/dL (ref 0–149)
VLDL Cholesterol Cal: 23 mg/dL (ref 5–40)

## 2022-02-16 LAB — COMPREHENSIVE METABOLIC PANEL
ALT: 27 IU/L (ref 0–32)
AST: 19 IU/L (ref 0–40)
Albumin/Globulin Ratio: 1.5 (ref 1.2–2.2)
Albumin: 4.4 g/dL (ref 3.9–4.9)
Alkaline Phosphatase: 98 IU/L (ref 44–121)
BUN/Creatinine Ratio: 10 (ref 9–23)
BUN: 8 mg/dL (ref 6–24)
Bilirubin Total: <0.2 mg/dL (ref 0.0–1.2)
CO2: 20 mmol/L (ref 20–29)
Calcium: 9.5 mg/dL (ref 8.7–10.2)
Chloride: 104 mmol/L (ref 96–106)
Creatinine, Ser: 0.83 mg/dL (ref 0.57–1.00)
Globulin, Total: 3 g/dL (ref 1.5–4.5)
Glucose: 94 mg/dL (ref 70–99)
Potassium: 4.2 mmol/L (ref 3.5–5.2)
Sodium: 141 mmol/L (ref 134–144)
Total Protein: 7.4 g/dL (ref 6.0–8.5)
eGFR: 87 mL/min/{1.73_m2} (ref >59)

## 2022-02-16 LAB — HEPATITIS C ANTIBODY: Hep C Virus Ab: NONREACTIVE

## 2022-02-16 LAB — HIV ANTIBODY (ROUTINE TESTING W REFLEX): HIV Screen 4th Generation wRfx: NONREACTIVE

## 2022-02-17 ENCOUNTER — Encounter: Payer: Self-pay | Admitting: Family Medicine

## 2022-02-17 NOTE — Progress Notes (Signed)
    CHIEF COMPLAINT / HPI: #1.  Follow-up left knee pain: She is finally starting to have some improvement in that.  She has been able to go several days with only using one of her pain pills.  Less locking. 2.  Unfortunately now she is having right knee pain is particularly sharp if she tries to totally extend the knee.  This started in the last week.  No known injury.  It is even difficult to stand for very long in the shower because bearing weight seems to aggravate it.  It has been a little bit swollen but was not as swollen as her left knee was when she injured that. 3.  Wants to be tested for HIV and any STIs that can be tested by blood work.  No concerns just wants to keep her testing up-to-date.   PERTINENT  PMH / PSH: I have reviewed the patient's medications, allergies, past medical and surgical history, smoking status and updated in the EMR as appropriate.   OBJECTIVE:  BP 128/80   Pulse 72   Wt 253 lb 6.4 oz (114.9 kg)   LMP 08/31/2018   SpO2 99%   BMI 42.17 kg/m  GENERAL: Well-developed female no acute distress KNEES: Left knee has full range of motion flexion extension.  There is still very slight effusion.  She is ligamentously intact to varus and valgus stress.  There is small amount of medial joint line tenderness but improved from prior exams.  There is no lateral joint line tenderness.  Patellar tendon area is nontender. Right knee: No effusion.  She has full extension but if she locks out in extension she has acute sharp pain in the posterior portion of her knee.  Popliteal space is benign.  Medial and lateral joint line are nontender to palpation.  Quadricep and patellar tendon are intact without tenderness to palpation.  Popliteal space is slightly full but no specific mass. N/V: Neurovascular intact distally bilateral lower extremities. PROCEDURE: INJECTION: Patient was given informed consent, signed copy in the chart. Appropriate time out was taken. Area prepped and  draped in usual sterile fashion. Ethyl chloride was  used for local anesthesia. A 21 gauge 1 1/2 inch needle was used..  1 cc of methylprednisolone 40 mg/ml plus 4 cc of 1% lidocaine without epinephrine was injected into the right knee using a(n) anterior medial approach.   The patient tolerated the procedure well. There were no complications. Post procedure instructions were given.   ASSESSMENT / PLAN: #1.  Left knee pain with known meniscal injury: Significantly improved. 2.  New acute right knee pain: I suspect this is from overuse.  May also be meniscal irritation or injury.  Could be flap tear given the acute pain with total extension.  Will try corticosteroid injection today and she will follow-up via phone in 2 to 3 weeks if not better. 3.'s STI testing: Not having symptoms just wants the blood work so we will order that.  No problem-specific Assessment & Plan notes found for this encounter.   [name] MD

## 2022-02-21 ENCOUNTER — Encounter: Payer: Self-pay | Admitting: Family Medicine

## 2022-02-27 ENCOUNTER — Other Ambulatory Visit: Payer: Self-pay | Admitting: Family Medicine

## 2022-02-27 ENCOUNTER — Telehealth: Payer: Self-pay | Admitting: Family Medicine

## 2022-02-27 DIAGNOSIS — S838X1D Sprain of other specified parts of right knee, subsequent encounter: Secondary | ICD-10-CM

## 2022-02-27 NOTE — Telephone Encounter (Signed)
Patient is calling and would like to let Dr. Nori Riis know that she is still having pain in her right knee and having the same episodes that she witnessed in office with freezing up and not being able to use knee. Patient was not wanting to schedule at the moment but just let Dr. Nori Riis know.   I informed patient Dr. Nori Riis may suggest an appointment.

## 2022-02-27 NOTE — Telephone Encounter (Signed)
Dear [facility] Team Can u call and ask her if she wants me to try and set up MRI or wait another few weeks? THANKS! [name]

## 2022-02-27 NOTE — Telephone Encounter (Signed)
Spoke to pt. She would like to get the MRI. Would like it done at [facility] for the Rt leg this time. .Please advise when order has been placed.   [name], CMA

## 2022-02-27 NOTE — Telephone Encounter (Signed)
Open in error

## 2022-02-28 NOTE — Telephone Encounter (Signed)
Mri right knee ordered She should hear from them in next 1 week Please let he rknow it has been ordered [name]

## 2022-02-28 NOTE — Addendum Note (Signed)
Addended byDickie La on: 02/28/2022 02:21 PM   Modules accepted: Orders

## 2022-03-05 ENCOUNTER — Other Ambulatory Visit: Payer: Self-pay | Admitting: Family Medicine

## 2022-03-24 ENCOUNTER — Ambulatory Visit (HOSPITAL_COMMUNITY): Payer: Medicaid Other

## 2022-04-04 ENCOUNTER — Other Ambulatory Visit: Payer: Self-pay | Admitting: Family Medicine

## 2022-04-07 ENCOUNTER — Other Ambulatory Visit: Payer: Self-pay | Admitting: Family Medicine

## 2022-04-17 ENCOUNTER — Telehealth: Payer: Self-pay

## 2022-04-17 NOTE — Telephone Encounter (Signed)
I do not give out information on my patients to anyone, inclding spouses, ex-spouses, significant others, etc.

## 2022-04-17 NOTE — Telephone Encounter (Signed)
Patient calls nurse line requesting to speak with PCP.   She reports she does not want PCP to share any medical or personal "business" about her to her ex husband Candela Desaulniers.   I advised her he was not on her ROI for our office and that there is a note her in chart specifically stating do not share information with him.   She reports she doesn't want PCP letting him know how she is doing if he asks during his office visits.   Advised I would pass this along.

## 2022-04-20 ENCOUNTER — Ambulatory Visit (HOSPITAL_COMMUNITY)
Admission: RE | Admit: 2022-04-20 | Discharge: 2022-04-20 | Disposition: A | Discharge status: Home / Self Care | Payer: Medicaid Other | Source: Ambulatory Visit | Attending: Family Medicine | Admitting: Family Medicine

## 2022-04-20 DIAGNOSIS — S838X1D Sprain of other specified parts of right knee, subsequent encounter: Secondary | ICD-10-CM | POA: Diagnosis present

## 2022-04-24 ENCOUNTER — Telehealth: Payer: Self-pay

## 2022-04-24 NOTE — Telephone Encounter (Signed)
Patient calls nurse line requesting MRI results.   Will forward to PCP.

## 2022-04-25 NOTE — Telephone Encounter (Signed)
Discussed MRI Significant cartilage loss will predispose to arthriots and maybe need for (early)TKR. Possible meniscal tear. She has f/u appt with ortho May 2nd.

## 2022-04-26 ENCOUNTER — Encounter: Payer: Self-pay | Admitting: Family Medicine

## 2022-04-26 ENCOUNTER — Ambulatory Visit (INDEPENDENT_AMBULATORY_CARE_PROVIDER_SITE_OTHER): Payer: Medicaid Other | Admitting: Family Medicine

## 2022-04-26 VITALS — BP 118/86 | HR 82 | Ht 65.0 in | Wt 250.8 lb

## 2022-04-26 DIAGNOSIS — S83203A Other tear of unspecified meniscus, current injury, right knee, initial encounter: Secondary | ICD-10-CM | POA: Diagnosis not present

## 2022-04-26 DIAGNOSIS — M1711 Unilateral primary osteoarthritis, right knee: Secondary | ICD-10-CM | POA: Diagnosis not present

## 2022-04-26 DIAGNOSIS — Z6379 Other stressful life events affecting family and household: Secondary | ICD-10-CM | POA: Diagnosis not present

## 2022-04-26 DIAGNOSIS — R269 Unspecified abnormalities of gait and mobility: Secondary | ICD-10-CM | POA: Diagnosis not present

## 2022-04-27 ENCOUNTER — Telehealth: Payer: Self-pay

## 2022-04-27 ENCOUNTER — Encounter: Payer: Self-pay | Admitting: Family Medicine

## 2022-04-27 DIAGNOSIS — M1711 Unilateral primary osteoarthritis, right knee: Secondary | ICD-10-CM | POA: Insufficient documentation

## 2022-04-27 DIAGNOSIS — R269 Unspecified abnormalities of gait and mobility: Secondary | ICD-10-CM | POA: Insufficient documentation

## 2022-04-27 NOTE — Telephone Encounter (Signed)
Patient calls nurse line requesting a letter of support from PCP.   Patient is requesting an emotional support animal.   Will forward to PCP for advisement.

## 2022-04-27 NOTE — Telephone Encounter (Signed)
Called patient.   She reports she is planning on going to an animal shelter to adopt a cat.   She reports she wants the letter stating she is "good" to adopt.  She reports she does not need one for her residence "that I know of."   Will forward to PCP.

## 2022-04-27 NOTE — Progress Notes (Signed)
CHIEF COMPLAINT / HPI:   Follow-up right knee pain.  She had her MRI.  We briefly discussed yesterday by phone but she was having trouble hearing what I was saying because she was in a very noisy place so she wants to review it in detail today.  He continues to buckle.  She has fallen several times.  She is unable to do some of her ADLs particularly dressing and shoes because she cannot really bend that knee.  She is also unsteady when she goes out in public.  She has 21 steps to her house and this is a problem.  Also having difficulty standing for long enough to take a shower.  Wonders if she can get a shower chair.  She had 1 previously when she had some problems about 5 years ago but no longer has it.  She also had a 3 pronged cane but lost that when she moved.  She does have follow-up with the orthopedist in a week or so.  Pain is currently being moderately managed with the medication I am giving her. 2.  Recent stressors.  Still estranged from her husband.  Her sister-in-law however recently passed and they are preparing for a funeral.  This is difficult given the family situation.  PERTINENT  PMH / PSH: I have reviewed the patient's medications, allergies, past medical and surgical history, smoking status and updated in the EMR as appropriate.   OBJECTIVE:  BP 118/86   Pulse 82   Ht  (1.651 m)   Wt 250 lb 12.8 oz (113.8 kg)   LMP 08/31/2018   SpO2 98%   BMI 41.74 kg/m  GENERAL: Well-developed no acute distress KNEE: Right.  Very small effusion notable in the lateral superior patellar pouch.  Full range of motion flexion extension.  Positive McMurray.  Positive Thessaly.  ASSESSMENT / PLAN: #1.  Right knee pain: DJD/meniscal tear/ difficulty with some ADLs and walking I reviewed her MRI images with her.  She has significant cartilage loss in this knee as well as a likely meniscal tear.  I suspect the meniscal issue is what is making her feel unstable on stance.  She already has  orthopedic follow-up set.    I will try to get her a new 3 pronged cane and shower chair because this is significantly impacting her ADLs.  I will also see if she is eligible for any personal care services at this time because she is unable to do all of her ADLs including some of her housework and having difficulty bathing. She is also having some difficulty dressing on some days, especially putting on her shoes.. .    Additionally she already has meniscal tear and knee pain on left knee.  I have discussed with her that I will place a referral to our care Management / social work team to get these services looked into and that they will be calling her.   She will get in touch with me after she sees the orthopedist to let me know what the decisions are. #2.  Discussed stressors.  Also reviewed with her absolute confidentiality about her medical issues and personal issues.  She had called [name] with a concern about that.  She tells me that somebody called her pharmacy wanting to know what medicine she was on.  I assured her that we would not disclose any inappropriate information to anyone including her estranged husband.  No problem-specific Assessment & Plan notes found for this encounter.  Denny Levy MD

## 2022-04-27 NOTE — Telephone Encounter (Signed)
Did she say who this was to be addressed to? Denny Levy

## 2022-04-28 ENCOUNTER — Encounter: Payer: Self-pay | Admitting: Family Medicine

## 2022-04-28 ENCOUNTER — Other Ambulatory Visit: Payer: Self-pay | Admitting: Family Medicine

## 2022-04-28 NOTE — Telephone Encounter (Signed)
RN team, Actually think she would be well-suited to have a cat it might be very beneficial for her.  I have pinned the letter and sent it to her through [name] mail.  She evidently does not use MyChart.  Please let her know it should be in the mail.  Thank you. Denny Levy

## 2022-04-28 NOTE — Telephone Encounter (Signed)
Called patient and provided update. Patient appreciative.   [name], RN

## 2022-05-01 ENCOUNTER — Telehealth: Payer: Self-pay

## 2022-05-01 ENCOUNTER — Other Ambulatory Visit: Payer: Self-pay

## 2022-05-01 NOTE — Telephone Encounter (Signed)
Community message sent to [employer] for DME [employer].   Will await response.   [name], RN

## 2022-05-01 NOTE — Telephone Encounter (Signed)
Receipt confirmed by [employer].   Tymeshia Awan C Seana Underwood, RN  

## 2022-05-02 MED ORDER — AMITRIPTYLINE HCL 25 MG PO TABS: ORAL_TABLET | ORAL | 0 refills | Status: DC

## 2022-05-08 ENCOUNTER — Other Ambulatory Visit: Payer: Self-pay | Admitting: Family Medicine

## 2022-05-09 ENCOUNTER — Telehealth: Payer: Self-pay

## 2022-05-09 NOTE — Telephone Encounter (Signed)
Patient calls nurse line multiple times in a row in regards to paperwork.   She reports she gave the paperwork to PCP at recent office visit.   She reports paperwork if for assistance around her home.   Will forward to PCP for an update.

## 2022-05-10 ENCOUNTER — Ambulatory Visit (INDEPENDENT_AMBULATORY_CARE_PROVIDER_SITE_OTHER): Payer: Medicaid Other | Admitting: Orthopaedic Surgery

## 2022-05-10 ENCOUNTER — Other Ambulatory Visit: Payer: Self-pay

## 2022-05-10 VITALS — Ht 65.0 in | Wt 248.0 lb

## 2022-05-10 DIAGNOSIS — M25561 Pain in right knee: Secondary | ICD-10-CM

## 2022-05-10 DIAGNOSIS — G8929 Other chronic pain: Secondary | ICD-10-CM

## 2022-05-10 DIAGNOSIS — M25562 Pain in left knee: Secondary | ICD-10-CM | POA: Diagnosis not present

## 2022-05-10 MED ORDER — LIDOCAINE HCL 1 % IJ SOLN
3.0000 mL | INTRAMUSCULAR | Status: AC | PRN
Start: 2022-05-10 — End: 2022-05-10
  Administered 2022-05-10: 3 mL

## 2022-05-10 MED ORDER — METHYLPREDNISOLONE ACETATE 40 MG/ML IJ SUSP
40.0000 mg | INTRAMUSCULAR | Status: AC | PRN
Start: 2022-05-10 — End: 2022-05-10
  Administered 2022-05-10: 40 mg via INTRA_ARTICULAR
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
## 2022-05-11 ENCOUNTER — Telehealth: Payer: Self-pay | Admitting: Family Medicine

## 2022-05-11 NOTE — Telephone Encounter (Signed)
Patient is calling and would like for Dr. Jennette Kettle to call her. She refused to speak with a nurse.

## 2022-05-11 NOTE — Telephone Encounter (Signed)
[employer] called to check the status.  The number they gave was [phone].  I am unable to locate the form in the already faxed pile.  To team for next steps. Jone Baseman, CMA

## 2022-05-11 NOTE — Telephone Encounter (Signed)
I will refax it  today THANKS! Denny Levy

## 2022-05-11 NOTE — Telephone Encounter (Signed)
Dr Jennette Kettle do you still have this paperwork?  Please advise.  Sunday Spillers, CMA

## 2022-05-11 NOTE — Telephone Encounter (Signed)
FYI... I have called and LVM with the below information.  Sunday Spillers, CMA

## 2022-05-11 NOTE — Telephone Encounter (Signed)
Called and spoke to pt. She asked we double check the fax number. [phone] is the fax number pt was given this morning to send the paperwork to. Please let pt know if we have any problems.  Holly Mendez, CMA

## 2022-05-17 ENCOUNTER — Telehealth: Payer: Self-pay

## 2022-05-17 NOTE — Telephone Encounter (Signed)
I fixed  the issue wit tha paperwork and faxed it again on May 3rd.

## 2022-05-17 NOTE — Telephone Encounter (Signed)
Patient calls nurse line aggressively shouting in regards to several things.   She reports the paperwork was received for her home health aide, however there were issues with the paperwork and they have since faxed [name] the paperwork back. This is in PCP box for review/correction.   She asked me what address we have for her. Patient began shouting stating that is her husband address. I updated patients address to her current location in [place].   Patient again states multiple times she does not want her information given to [employer].

## 2022-05-18 NOTE — Telephone Encounter (Signed)
Form has been completed and faxed.  Copy for scanning and one is at my desk in the nurse room.

## 2022-05-19 ENCOUNTER — Telehealth: Payer: Self-pay | Admitting: Family Medicine

## 2022-05-19 NOTE — Telephone Encounter (Signed)
Patient called upset and demanding to speak with Dr. Jennette Kettle. She said she is done speaking with nurses.   She states that the form was missing page two? She would like for Dr. Jennette Kettle to call her to discuss.

## 2022-05-19 NOTE — Telephone Encounter (Signed)
I called and left a voice message on her voicemail.  Told her that I had received the form back because they had not received the second page.  Yesterday we discovered that, printed off a new page filled out and sent it back so it has been faxed.  I also told her it was not the nurses fault and that I would appreciate if she would not get mad at them.

## 2022-05-19 NOTE — Telephone Encounter (Signed)
Opened in error

## 2022-05-23 ENCOUNTER — Ambulatory Visit (HOSPITAL_COMMUNITY): Payer: Medicaid Other | Attending: Orthopaedic Surgery | Admitting: Physical Therapy

## 2022-05-23 ENCOUNTER — Encounter (HOSPITAL_COMMUNITY): Payer: Self-pay | Admitting: Physical Therapy

## 2022-05-23 DIAGNOSIS — M25562 Pain in left knee: Secondary | ICD-10-CM | POA: Diagnosis present

## 2022-05-23 DIAGNOSIS — R2689 Other abnormalities of gait and mobility: Secondary | ICD-10-CM

## 2022-05-23 DIAGNOSIS — G8929 Other chronic pain: Secondary | ICD-10-CM | POA: Diagnosis not present

## 2022-05-23 DIAGNOSIS — M25561 Pain in right knee: Secondary | ICD-10-CM | POA: Insufficient documentation
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
## 2022-05-26 ENCOUNTER — Other Ambulatory Visit: Payer: Self-pay | Admitting: Family Medicine

## 2022-05-29 ENCOUNTER — Other Ambulatory Visit: Payer: Self-pay | Admitting: Family Medicine

## 2022-06-01 ENCOUNTER — Encounter (HOSPITAL_COMMUNITY): Payer: Medicaid Other | Admitting: Physical Therapy

## 2022-06-06 ENCOUNTER — Encounter (HOSPITAL_COMMUNITY): Payer: Medicaid Other | Admitting: Physical Therapy

## 2022-06-06 ENCOUNTER — Telehealth (HOSPITAL_COMMUNITY): Payer: Self-pay | Admitting: Physical Therapy

## 2022-06-06 NOTE — Telephone Encounter (Signed)
Pt first no show:   Called pt who stated that she forgot about today's appointment.   Virgina Organ, PT CLT [phone]

## 2022-06-07 ENCOUNTER — Ambulatory Visit (INDEPENDENT_AMBULATORY_CARE_PROVIDER_SITE_OTHER): Payer: Medicaid Other | Admitting: Family Medicine

## 2022-06-07 ENCOUNTER — Encounter: Payer: Self-pay | Admitting: Family Medicine

## 2022-06-07 VITALS — BP 126/82 | HR 87 | Ht 65.0 in | Wt 243.8 lb

## 2022-06-07 DIAGNOSIS — I10 Essential (primary) hypertension: Secondary | ICD-10-CM

## 2022-06-07 DIAGNOSIS — M25562 Pain in left knee: Secondary | ICD-10-CM

## 2022-06-07 DIAGNOSIS — R269 Unspecified abnormalities of gait and mobility: Secondary | ICD-10-CM

## 2022-06-07 DIAGNOSIS — M1711 Unilateral primary osteoarthritis, right knee: Secondary | ICD-10-CM

## 2022-06-07 MED ORDER — CLONIDINE 0.1 MG/24HR TD PTWK: MEDICATED_PATCH | TRANSDERMAL | 3 refills | Status: DC

## 2022-06-07 MED ORDER — METRONIDAZOLE 500 MG PO TABS: ORAL_TABLET | ORAL | 3 refills | Status: DC

## 2022-06-07 MED ORDER — ESOMEPRAZOLE MAGNESIUM 40 MG PO CPDR: 40.0000 mg | DELAYED_RELEASE_CAPSULE | Freq: Every day | ORAL | 1 refills | Status: DC

## 2022-06-08 ENCOUNTER — Encounter (HOSPITAL_COMMUNITY): Payer: Medicaid Other | Admitting: Physical Therapy

## 2022-06-08 NOTE — Assessment & Plan Note (Signed)
Continue follow up with orthopedics.

## 2022-06-08 NOTE — Assessment & Plan Note (Signed)
Blood pressures well-controlled.  I refilled her clonidine patch.  Will continue current medication regimen.  Follow-up this in 3 months.

## 2022-06-08 NOTE — Assessment & Plan Note (Signed)
Continue follow-up with orthopedics.  She may need arthroscopic intervention according to her report.  We discussed at some length.

## 2022-06-08 NOTE — Progress Notes (Signed)
    CHIEF COMPLAINT / HPI: #1.  Follow-up bilateral right greater than left knee pain.  Still having significant issues with the right knee causing 8 out of 10 pain at times.  She is using a cane.  She feels very unsteady on it.  She is being followed by orthopedics and they gave her an injection with corticosteroid and placed her in physical therapy.  She has been to 3 sessions of PT and says it mostly just makes her knee hurt worse and swell.  She has follow-up with the orthopedist on the 12th. 2.  She was denied personal care services and not really sure she understands why that is.  Has some questions for me 3.  Needs some refills.  Continues to have reflux symptoms as she had tapered off her PPI. 4.  Mental health: She is now seeing a therapist and a psychiatrist regularly.  This has been very helpful for her. 5.  Transportation instability: She has been able to receive transportation through [employer] to her medical appointments and this has been very helpful.   PERTINENT  PMH / PSH: I have reviewed the patient's medications, allergies, past medical and surgical history, smoking status and updated in the EMR as appropriate.   OBJECTIVE:  BP 126/82   Pulse 87   Ht 5\' 5"  (1.651 m)   Wt 243 lb 12.8 oz (110.6 kg)   LMP 08/31/2018   SpO2 98%   BMI 40.57 kg/m  GENERAL: Well-developed female no acute distress.  Walking with a cane, antalgic gait.  She rises from a chair with significant assistance from the cane. PSYCH: AxOx4. Good eye contact.. No psychomotor retardation or agitation. Appropriate speech fluency and content. Asks and answers questions appropriately. Mood is congruent.   ASSESSMENT / PLAN:   Gait abnormality Still pretty significant gait abnormality.  Glad she is in physical therapy and has follow-up with the orthopedist.  We discussed what he told her about possibly needing surgical intervention.  She evidently has significant arthritis and he was hoping to preserve more  meniscus.  Left knee pain Continue follow-up with orthopedics.  Hypertension Blood pressures well-controlled.  I refilled her clonidine patch.  Will continue current medication regimen.  Follow-up this in 3 months.  Osteoarthritis of right knee Continue follow-up with orthopedics.  She may need arthroscopic intervention according to her report.  We discussed at some length.   Denny Levy MD

## 2022-06-08 NOTE — Assessment & Plan Note (Signed)
Still pretty significant gait abnormality.  Glad she is in physical therapy and has follow-up with the orthopedist.  We discussed what he told her about possibly needing surgical intervention.  She evidently has significant arthritis and he was hoping to preserve more meniscus.

## 2022-06-13 ENCOUNTER — Encounter (HOSPITAL_COMMUNITY): Payer: Medicaid Other | Admitting: Physical Therapy

## 2022-06-15 ENCOUNTER — Encounter (HOSPITAL_COMMUNITY): Payer: Medicaid Other | Admitting: Physical Therapy

## 2022-06-15 ENCOUNTER — Encounter (HOSPITAL_COMMUNITY): Payer: Self-pay

## 2022-06-15 NOTE — Therapy (Signed)
[facility] Outpatient Rehabilitation at [facility] [address] [place], [place], [address] Phone: [phone]   Fax:  [phone]  Patient Details  Name: Holly Mendez MRN: [id number] Date of Birth: Jul 04, 1975 Referring Provider:  No ref. provider found  Encounter Date: 06/15/2022 PHYSICAL THERAPY DISCHARGE SUMMARY  Visits from Start of Care: 1  Current functional level related to goals / functional outcomes: unknown   Remaining deficits: unknown   Education / Equipment:HEP   Patient agrees to discharge. Patient goals were not met. Patient is being discharged due to not returning since the last visit.   Marisue Brooklyn, PT 06/15/2022, 2:26 PM  [facility] Outpatient Rehabilitation at [facility] [address] [place], [place], [address] Phone: [phone]   Fax:  [phone]

## 2022-06-19 ENCOUNTER — Other Ambulatory Visit: Payer: Self-pay | Admitting: Family Medicine

## 2022-06-19 ENCOUNTER — Other Ambulatory Visit: Payer: Self-pay

## 2022-06-20 ENCOUNTER — Encounter (HOSPITAL_COMMUNITY): Payer: Medicaid Other | Admitting: Physical Therapy

## 2022-06-21 ENCOUNTER — Ambulatory Visit (INDEPENDENT_AMBULATORY_CARE_PROVIDER_SITE_OTHER): Payer: Medicaid Other | Admitting: Orthopaedic Surgery

## 2022-06-21 ENCOUNTER — Encounter: Payer: Self-pay | Admitting: Orthopaedic Surgery

## 2022-06-21 ENCOUNTER — Telehealth: Payer: Self-pay

## 2022-06-21 DIAGNOSIS — M1711 Unilateral primary osteoarthritis, right knee: Secondary | ICD-10-CM | POA: Diagnosis not present

## 2022-06-21 DIAGNOSIS — M25562 Pain in left knee: Secondary | ICD-10-CM | POA: Diagnosis not present

## 2022-06-21 DIAGNOSIS — M1712 Unilateral primary osteoarthritis, left knee: Secondary | ICD-10-CM | POA: Diagnosis not present

## 2022-06-21 DIAGNOSIS — M25561 Pain in right knee: Secondary | ICD-10-CM

## 2022-06-21 DIAGNOSIS — G8929 Other chronic pain: Secondary | ICD-10-CM

## 2022-06-21 MED ORDER — CELECOXIB 200 MG PO CAPS: 200.0000 mg | ORAL_CAPSULE | Freq: Two times a day (BID) | ORAL | 1 refills | Status: DC | PRN

## 2022-06-21 NOTE — Progress Notes (Signed)
The patient comes in with continued pain in both of her knees.  She has full-thickness cartilage loss in her right knee seen recently on MRI.  She is 47 years old and young.  Her BMI is well over 40.  She is not a candidate for any type of knee replacement surgery.  We did place steroid injections in both her knees and counseled her about weight loss.  She still has pain and swelling in her knees and said she only went to 1 physical therapy visit and cannot afford to get any more therapy.  She has taken meloxicam in the past and is currently not on any anti-inflammatories.  NuPrep both knees have slight valgus malalignment with no effusion today at all.  Both knees are ligamentously stable with good range of motion but are painful.  She does walk with a limp.  Again counseled her about weight loss and the strengthening exercises.  Will try some Celebrex and I sent this into her pharmacy.  Potentially she could be a candidate for viscosupplementation so we will see if we can order hyaluronic acid for her knees.  This patient is diagnosed with osteoarthritis of the knee(s).    Radiographs show evidence of joint space narrowing, osteophytes, subchondral sclerosis and/or subchondral cysts.  This patient has knee pain which interferes with functional and activities of daily living.    This patient has experienced inadequate response, adverse effects and/or intolerance with conservative treatments such as acetaminophen, NSAIDS, topical creams, physical therapy or regular exercise, knee bracing and/or weight loss.   This patient has experienced inadequate response or has a contraindication to intra articular steroid injections for at least 3 months.   This patient is not scheduled to have a total knee replacement within 6 months of starting treatment with viscosupplementation.

## 2022-06-21 NOTE — Telephone Encounter (Signed)
Bilateral knee gel injection  

## 2022-06-21 NOTE — Telephone Encounter (Signed)
I talked with patient and advised her about TriVisc, but patient declined due to finances.  Patient has an appointment in July for cortisone injections.

## 2022-06-21 NOTE — Telephone Encounter (Signed)
Medicaid does not cover any gel injections.

## 2022-06-22 ENCOUNTER — Encounter (HOSPITAL_COMMUNITY): Payer: Medicaid Other | Admitting: Physical Therapy

## 2022-06-27 ENCOUNTER — Encounter (HOSPITAL_COMMUNITY): Payer: Medicaid Other | Admitting: Physical Therapy

## 2022-06-28 ENCOUNTER — Telehealth: Payer: Self-pay

## 2022-06-28 ENCOUNTER — Other Ambulatory Visit (HOSPITAL_COMMUNITY): Payer: Self-pay

## 2022-06-28 NOTE — Telephone Encounter (Signed)
A Prior Authorization was initiated for this patients NORCO through [employer].   Confirmation # [id number]

## 2022-06-29 ENCOUNTER — Encounter (HOSPITAL_COMMUNITY): Payer: Medicaid Other | Admitting: Physical Therapy

## 2022-07-01 ENCOUNTER — Encounter (HOSPITAL_COMMUNITY): Payer: Self-pay | Admitting: *Deleted

## 2022-07-01 ENCOUNTER — Other Ambulatory Visit (HOSPITAL_COMMUNITY): Payer: Self-pay

## 2022-07-01 ENCOUNTER — Other Ambulatory Visit: Payer: Self-pay

## 2022-07-01 ENCOUNTER — Emergency Department (HOSPITAL_COMMUNITY)
Admission: EM | Admit: 2022-07-01 | Discharge: 2022-07-01 | Disposition: A | Discharge status: Home / Self Care | Payer: Medicaid Other | Attending: Emergency Medicine | Admitting: Emergency Medicine

## 2022-07-01 DIAGNOSIS — R21 Rash and other nonspecific skin eruption: Secondary | ICD-10-CM | POA: Diagnosis present

## 2022-07-01 MED ORDER — CETIRIZINE HCL 10 MG PO TABS: 10.0000 mg | ORAL_TABLET | Freq: Every day | ORAL | 0 refills | Status: DC

## 2022-07-01 MED ORDER — PREDNISONE 20 MG PO TABS
40.0000 mg | ORAL_TABLET | Freq: Once | ORAL | Status: AC
  Administered 2022-07-01: 40 mg via ORAL
  Filled 2022-07-01: qty 2

## 2022-07-01 MED ORDER — PREDNISONE 10 MG PO TABS: 40.0000 mg | ORAL_TABLET | Freq: Every day | ORAL | 0 refills | Status: AC

## 2022-07-01 NOTE — ED Triage Notes (Signed)
Pt states rash started 2 days ago and has spread.  Pt denies any new lotions or soaps.  Pt started new medication Celebrex 200mg  on 06/21/22. Pt states rash does itch and burning pain.

## 2022-07-01 NOTE — ED Notes (Signed)
No reaction post med

## 2022-07-01 NOTE — Discharge Instructions (Signed)
Seen today for rash.  This may be due to the medication you had started.  You have already stopped taking this medication but we will give you steroids to help with the reaction.  It may also be due to some other contact.  You to follow-up with your primary care doctor if you are not getting better you will also likely to see dermatology.  To the steroids and the Zyrtec you can use over-the-counter medicated lotion such as Goldbond or Sarna with your itching.  If you develop swelling of your hands or feet, fever, swelling of your lips tongue mouth or any other new or worsening symptoms you should come back to the ER right away.

## 2022-07-01 NOTE — ED Notes (Signed)
Introduced self to pt  Pt placed on celebrex 6/12 On 6/15 she noticed skin burning and itching Pt tried OTC benadryl and hydrocortizone cream without help Stopped Celebrex Thursday   Rash to back, ABD and legs  Pt is anxious Denies CP and SOB Attached to full monitor Vitals listed 12 lead assessed

## 2022-07-01 NOTE — ED Provider Notes (Signed)
[facility] EMERGENCY DEPARTMENT AT [facility] Provider Note   CSN: [id number] Arrival date & time: 07/01/22  1813     History  Chief Complaint  Patient presents with   Rash    Holly Mendez is a 47 y.o. female.  She reports rash that started 2 days ago, started on her chest and back now spread to bilateral arms.  She started taking Celebrex which is a new medication for her on 6/12, she stopped it 2 days ago when she noticed the rash because she was worried about possible allergic reaction.  She has no cough, wheezing, shortness of breath, swelling of her lips throat or tongue.  She tried Benadryl at home without relief.  Denies any new soaps, lotions, detergents, cosmetics or other any new medications or other exposures   Rash      Home Medications Prior to Admission medications   Medication Sig Start Date End Date Taking? Authorizing Provider  predniSONE (DELTASONE) 10 MG tablet Take 4 tablets (40 mg total) by mouth daily for 4 days. 07/02/22 07/06/22 Yes Rayaan Garguilo A, PA-C  albuterol (PROAIR HFA) 108 (90 Base) MCG/ACT inhaler INHALE TWO PUFFS INTO THE LUNGS EVERY 6 HOURS AS NEEDED WHEEZING OR SHORTNESS OF BREATH 09/07/21   Nestor Ramp, MD  amitriptyline (ELAVIL) 25 MG tablet TAKE (3) TABLETS BY MOUTH ONCE AT BEDTIME. 05/29/22   Nestor Ramp, MD  budesonide-formoterol [facility]) 80-4.5 MCG/ACT inhaler Inhale 2 puffs into the lungs 2 (two) times daily as needed (shortness of breath). 09/07/21   Nestor Ramp, MD  celecoxib (CELEBREX) 200 MG capsule Take 1 capsule (200 mg total) by mouth 2 (two) times daily between meals as needed. 06/21/22   Kathryne Hitch, MD  cetirizine (ZYRTEC) 10 MG tablet Take 1 tablet (10 mg total) by mouth daily. 07/01/22 07/01/23  Carmel Sacramento A, PA-C  cloNIDine (CATAPRES - DOSED IN MG/24 HR) 0.1 mg/24hr patch PLACE 1 PATCH ONTO SKIN ONCE WEEKLY. 06/07/22   Nestor Ramp, MD  diclofenac Sodium (VOLTAREN) 1 % GEL Apply 2 g topically 4  (four) times daily. For left knee. 08/12/21   Moses Manners, MD  esomeprazole (NEXIUM) 40 MG capsule Take 1 capsule (40 mg total) by mouth daily. 06/07/22   Nestor Ramp, MD  estradiol (ESTRACE) 1 MG tablet Take 1 tablet (1 mg total) by mouth daily. 12/28/21   Nestor Ramp, MD  fluconazole (DIFLUCAN) 150 MG tablet TAKE (1) TABLET BY MOUTH EVERY OTHER DAY. 05/08/22   Nestor Ramp, MD  fluticasone [facility]) 50 MCG/ACT nasal spray Place 2 sprays into both nostrils daily. 09/07/21   Nestor Ramp, MD  HYDROcodone-acetaminophen (NORCO/VICODIN) 5-325 MG tablet TAKE 1 TABLET BY MOUTH TWICE A DAY AS NEEDED FOR SEVERE PAIN 06/19/22   Nestor Ramp, MD  meloxicam (MOBIC) 7.5 MG tablet Take 1 tablet (7.5 mg total) by mouth daily. For pain and inflammation 08/12/21   Moses Manners, MD  metroNIDAZOLE (FLAGYL) 500 MG tablet Take one twice a day for 7 days 06/07/22   Nestor Ramp, MD  Na Sulfate-K Sulfate-Mg Sulf 17.5-3.13-1.6 GM/177ML SOLN Take 1 kit by mouth as directed. May use generic Suprep 12/27/20   Sherrilyn Rist, MD  NARCAN 4 MG/0.1ML LIQD nasal spray kit Place 1 spray into the nose daily as needed (overdose). 02/15/22   Nestor Ramp, MD  ondansetron (ZOFRAN) 4 MG tablet Take 1 tablet (4 mg total) by mouth every 8 (  eight) hours as needed for nausea or vomiting. 01/17/22   Nestor Ramp, MD  polyethylene glycol powder Joylene John) 17 GM/SCOOP powder Take one or two capfuls daily by mouth as directed for constipation 10/25/21   Nestor Ramp, MD  sucralfate (CARAFATE) 1 g tablet Take 1 tablet (1 g total) by mouth 4 (four) times daily - with meals and at bedtime. 07/28/20   Nestor Ramp, MD      Allergies    Lyrica [pregabalin], Gabapentin, Naproxen, Norvasc [amlodipine besylate], and Zithromax [azithromycin]    Review of Systems   Review of Systems  Skin:  Positive for rash.    Physical Exam Updated Vital Signs BP 123/83   Pulse 79   Temp 97.8 F (36.6 C) (Oral)   Resp (!) 21   Ht 5\' 5"  (1.651 m)   Wt  109.8 kg   LMP 08/31/2018   SpO2 100%   BMI 40.27 kg/m  Physical Exam Vitals and nursing note reviewed.  Constitutional:      General: She is not in acute distress.    Appearance: She is well-developed.  HENT:     Head: Normocephalic and atraumatic.  Eyes:     Conjunctiva/sclera: Conjunctivae normal.  Cardiovascular:     Rate and Rhythm: Normal rate and regular rhythm.     Heart sounds: No murmur heard. Pulmonary:     Effort: Pulmonary effort is normal. No respiratory distress.     Breath sounds: Normal breath sounds.  Abdominal:     Palpations: Abdomen is soft.     Tenderness: There is no abdominal tenderness.  Musculoskeletal:        General: No swelling.     Cervical back: Neck supple.  Skin:    General: Skin is warm and dry.     Capillary Refill: Capillary refill takes less than 2 seconds.     Comments: Papular rash to the upper chest bilateral upper arms and right AC area, no erythema or warmth, there are excoriations on bilateral arms.  No petechiae, no rash to the palms or soles.    Neurological:     General: No focal deficit present.     Mental Status: She is alert and oriented to person, place, and time.  Psychiatric:        Mood and Affect: Mood normal.     ED Results / Procedures / Treatments   Labs (all labs ordered are listed, but only abnormal results are displayed) Labs Reviewed - No data to display  EKG None  Radiology No results found.  Procedures Procedures    Medications Ordered in ED Medications  predniSONE (DELTASONE) tablet 40 mg (40 mg Oral Given 07/01/22 2016)    ED Course/ Medical Decision Making/ A&P                             Medical Decision Making This patient presents to the ED for concern of rash, this involves an extensive number of treatment options, and is a complaint that carries with it a high risk of complications and morbidity.  The differential diagnosis includes contact dermatitis, drug rash, viral exanthem,  cellulitis, other   Co morbidities that complicate the patient evaluation  Bipolar disorder, osteoarthritis   Additional history obtained:  Additional history obtained from emr External records from outside source obtained and reviewed including notes    Problem List / ED Course / Critical interventions / Medication management  -Patient has  a papular rash to her arms and chest, now starting to have some itching on her legs but I do not appreciate significant rash on her legs.  There is no signs of cellulitis.  She is starting a new medication prior to this but has stopped it.  No other new exposures she has no signs of anaphylaxis.  There is no mucosal involvement or swelling to the palms or soles, no other red flag symptoms.  Will treat with steroids and antihistamines and advised on close outpatient follow-up.  She is given strict return precautions.  I have reviewed the patients home medicines and have made adjustments as needed       Risk OTC drugs. Prescription drug management.           Final Clinical Impression(s) / ED Diagnoses Final diagnoses:  Rash    Rx / DC Orders ED Discharge Orders          Ordered    predniSONE (DELTASONE) 10 MG tablet  Daily        07/01/22 2042    cetirizine (ZYRTEC) 10 MG tablet  Daily       Note to Pharmacy: This prescription was filled on 06/29/2020. Any refills authorized will be placed on file.   07/01/22 1946              Josem Kaufmann 07/01/22 2336    Rondel Baton, MD 07/02/22 1026

## 2022-07-01 NOTE — ED Notes (Signed)
Medicated per Mar Pt stated she has never had med before Pt stated that she has had steroid injections in knees in the past

## 2022-07-03 NOTE — Telephone Encounter (Signed)
Decision came back as "terminated". Unsure why. Called pharmacy to follow up and medication was picked up 06/29/22 with $4 copay on insurance.

## 2022-07-04 ENCOUNTER — Encounter (HOSPITAL_COMMUNITY): Payer: Medicaid Other | Admitting: Physical Therapy

## 2022-07-06 ENCOUNTER — Encounter (HOSPITAL_COMMUNITY): Payer: Medicaid Other | Admitting: Physical Therapy

## 2022-07-07 ENCOUNTER — Telehealth: Payer: Self-pay | Admitting: Student

## 2022-07-07 ENCOUNTER — Other Ambulatory Visit: Payer: Self-pay | Admitting: Family Medicine

## 2022-07-07 ENCOUNTER — Telehealth: Payer: Self-pay

## 2022-07-07 MED ORDER — PREDNISONE 20 MG PO TABS
20.0000 mg | ORAL_TABLET | Freq: Every day | ORAL | 0 refills | Status: DC
Start: 2022-07-07 — End: 2022-08-02

## 2022-07-07 NOTE — Telephone Encounter (Signed)
Patient calls nurse line requesting a refill on Prednisone.   She reports she went to the ED after an allergic reaction to Celebrex. She reports a rash all over her arms. She reports it feels like a heat rash. She reports she completed the course given to her by ED, however the rash is still persistent.   She denies any SOB or difficulty breathing.   Patient has an apt with PCP on 7/10.  Will forward to PCP.

## 2022-07-07 NOTE — Telephone Encounter (Signed)
**  After Hours/ Emergency Line Call**  Received a page to call [phone] - [phone].  Patient: Holly Mendez  Caller: Self  Confirmed name & DOB of patient with caller  Subjective:  Note's that she got started on Celebrex for knee pain, as opposed to steroid injections. She ended up in the [facility] with a rash, and was started on a steroid, prednisone. Note's that she took all her meds, and is wanting refill of steroids. She note's that she was only given 16 pills, and that is was helping.   Objective:  Observations: Irritated from itching  Assessment & Plan  Holly Mendez is a 47 y.o. female requesting refill of prednisone for itching. Through chart review, it appears that medicine was sent in, by PCP Dr. Jennette Kettle. I informed patient that an Rx had already been sent, and patient was appreciative.   -- Will forward to PCP.  Bess Kinds, MD [facility], PGY-1

## 2022-07-11 ENCOUNTER — Encounter (HOSPITAL_COMMUNITY): Payer: Medicaid Other | Admitting: Physical Therapy

## 2022-07-11 ENCOUNTER — Ambulatory Visit: Payer: Medicaid Other | Admitting: Physician Assistant

## 2022-07-18 ENCOUNTER — Ambulatory Visit: Payer: Medicaid Other | Admitting: Physician Assistant

## 2022-07-19 ENCOUNTER — Telehealth: Payer: MEDICAID | Admitting: Family Medicine

## 2022-07-19 DIAGNOSIS — M25569 Pain in unspecified knee: Secondary | ICD-10-CM | POA: Diagnosis not present

## 2022-07-19 MED ORDER — HYDROCODONE-ACETAMINOPHEN 5-325 MG PO TABS: 1.0000 | ORAL_TABLET | Freq: Three times a day (TID) | ORAL | 0 refills | Status: DC | PRN

## 2022-07-20 ENCOUNTER — Encounter: Payer: Self-pay | Admitting: Family Medicine

## 2022-07-20 NOTE — Progress Notes (Signed)
[facility] Telemedicine Visit  Patient consented to have virtual visit and was identified by name and date of birth. Method of visit: Video  Encounter participants: Patient: SULTANA TIERNEY - located at home Provider: Denny Levy - located at [facility] Others (if applicable): None  Chief Complaint: Continued knee pain.  Pain medication not adequate.  HPI:  Has seen her orthopedist for the PA 2 or 3 times since we discussed last.  Feels like she is getting the run around.  They talked about giving her viscosupplementation but insurance was unable to approve that.  She went back to the office to see if they would consider another steroid injection and they said it was too soon.  She has a scheduled appointment in 1 August.  She is ready to consider surgical intervention at this point because she is unable to do many of her ADLs.  She has 21 steps up to her apartment and this is quite painful for her.  Makes her not want to go out and do her errands.  She also has difficulty standing long enough to do household chores such as vacuuming, laundry etc.  Started to get very frustrated and depressed about the whole situation.  ROS: per HPI  Pertinent PMHx: OA bilateral knees  Exam:  LMP 08/31/2018   Respiratory: Normal respiratory effort PSYCH: Alert and oriented x 4.  Affect somewhat tearful at times obviously frustrated but asks and answers questions appropriately.  Speech is at times a little pressured when she is talking about her frustrations but otherwise is normal.  Assessment/Plan: Knee pain: Discussed her issues.  I understand her frustrations.  And glad she has a appointment coming up and I urged her to discuss with her orthopedist and tell him what she has told me today.  From reading his notes I think there is no guarantee that surgical intervention will significantly improve this and she is younger than he would like to consider TKR, however it seems to be really  impacting her life.  I will slightly increase her pain medication to get her through till her appointment.  She will contact me after she sees him. No problem-specific Assessment & Plan notes found for this encounter.    Time spent during visit with patient: 22 minutes

## 2022-07-21 ENCOUNTER — Other Ambulatory Visit: Payer: Self-pay | Admitting: Orthopaedic Surgery

## 2022-07-21 ENCOUNTER — Telehealth: Payer: Self-pay | Admitting: Orthopaedic Surgery

## 2022-07-21 MED ORDER — NABUMETONE 500 MG PO TABS: 500.0000 mg | ORAL_TABLET | Freq: Two times a day (BID) | ORAL | 0 refills | Status: DC | PRN

## 2022-07-21 NOTE — Telephone Encounter (Signed)
Patient aware this was called in for her  

## 2022-07-21 NOTE — Telephone Encounter (Signed)
Patient called. Says she is having a reaction to Celebrex. Would like someone to call her in something. Her cb # is [phone]

## 2022-07-25 ENCOUNTER — Emergency Department (HOSPITAL_COMMUNITY): Payer: MEDICAID

## 2022-07-25 ENCOUNTER — Emergency Department (HOSPITAL_COMMUNITY)
Admission: EM | Admit: 2022-07-25 | Discharge: 2022-07-26 | Disposition: A | Discharge status: Home / Self Care | Payer: MEDICAID | Attending: Emergency Medicine | Admitting: Emergency Medicine

## 2022-07-25 ENCOUNTER — Encounter (HOSPITAL_COMMUNITY): Payer: Self-pay

## 2022-07-25 ENCOUNTER — Other Ambulatory Visit: Payer: Self-pay

## 2022-07-25 DIAGNOSIS — I10 Essential (primary) hypertension: Secondary | ICD-10-CM | POA: Insufficient documentation

## 2022-07-25 DIAGNOSIS — J45909 Unspecified asthma, uncomplicated: Secondary | ICD-10-CM | POA: Insufficient documentation

## 2022-07-25 DIAGNOSIS — W109XXA Fall (on) (from) unspecified stairs and steps, initial encounter: Secondary | ICD-10-CM | POA: Insufficient documentation

## 2022-07-25 DIAGNOSIS — Z7951 Long term (current) use of inhaled steroids: Secondary | ICD-10-CM | POA: Diagnosis not present

## 2022-07-25 DIAGNOSIS — Z79899 Other long term (current) drug therapy: Secondary | ICD-10-CM | POA: Insufficient documentation

## 2022-07-25 DIAGNOSIS — S81811A Laceration without foreign body, right lower leg, initial encounter: Secondary | ICD-10-CM | POA: Insufficient documentation

## 2022-07-25 DIAGNOSIS — W19XXXA Unspecified fall, initial encounter: Secondary | ICD-10-CM

## 2022-07-25 LAB — CBC WITH DIFFERENTIAL/PLATELET
Abs Immature Granulocytes: 0.01 10*3/uL (ref 0.00–0.07)
Basophils Absolute: 0 10*3/uL (ref 0.0–0.1)
Basophils Relative: 0 %
Eosinophils Absolute: 0 10*3/uL (ref 0.0–0.5)
Eosinophils Relative: 1 %
HCT: 33.4 % — ABNORMAL LOW (ref 36.0–46.0)
Hemoglobin: 11.2 g/dL — ABNORMAL LOW (ref 12.0–15.0)
Immature Granulocytes: 0 %
Lymphocytes Relative: 38 %
Lymphs Abs: 2.7 10*3/uL (ref 0.7–4.0)
MCH: 29.6 pg (ref 26.0–34.0)
MCHC: 33.5 g/dL (ref 30.0–36.0)
MCV: 88.4 fL (ref 80.0–100.0)
Monocytes Absolute: 0.6 10*3/uL (ref 0.1–1.0)
Monocytes Relative: 8 %
Neutro Abs: 3.8 10*3/uL (ref 1.7–7.7)
Neutrophils Relative %: 53 %
Platelets: 283 10*3/uL (ref 150–400)
RBC: 3.78 MIL/uL — ABNORMAL LOW (ref 3.87–5.11)
RDW: 14.8 % (ref 11.5–15.5)
WBC: 7.2 10*3/uL (ref 4.0–10.5)
nRBC: 0 % (ref 0.0–0.2)

## 2022-07-25 MED ORDER — LIDOCAINE-EPINEPHRINE (PF) 2 %-1:200000 IJ SOLN
10.0000 mL | Freq: Once | INTRAMUSCULAR | Status: AC
  Administered 2022-07-25: 10 mL
  Filled 2022-07-25: qty 20

## 2022-07-25 MED ORDER — BACITRACIN ZINC 500 UNIT/GM EX OINT
TOPICAL_OINTMENT | CUTANEOUS | Status: AC
  Filled 2022-07-25: qty 0.9

## 2022-07-25 MED ORDER — OXYCODONE HCL 5 MG PO TABS: 5.0000 mg | ORAL_TABLET | Freq: Once | ORAL | Status: DC

## 2022-07-25 MED ORDER — FENTANYL CITRATE PF 50 MCG/ML IJ SOSY
50.0000 ug | PREFILLED_SYRINGE | Freq: Once | INTRAMUSCULAR | Status: AC
  Administered 2022-07-25: 50 ug via INTRAVENOUS
  Filled 2022-07-25 (×2): qty 1

## 2022-07-25 MED ORDER — FENTANYL CITRATE PF 50 MCG/ML IJ SOSY
50.0000 ug | PREFILLED_SYRINGE | Freq: Once | INTRAMUSCULAR | Status: AC
  Administered 2022-07-25: 50 ug via INTRAVENOUS
  Filled 2022-07-25: qty 1

## 2022-07-25 MED ORDER — LIDOCAINE-EPINEPHRINE (PF) 1 %-1:200000 IJ SOLN
20.0000 mL | Freq: Once | INTRAMUSCULAR | Status: AC
  Administered 2022-07-25: 20 mL via INTRADERMAL
  Filled 2022-07-25: qty 30

## 2022-07-25 NOTE — Discharge Instructions (Addendum)
Thank you for coming to [facility] Emergency Department. You were seen for fall with laceration to your right leg. We did an exam, labs, and imaging, and these showed a large laceration to your right leg. Please keep the wound clean and dry. The sutures need to be removed in 7 days by a medical professional. Please watch for signs of infection.  You can alternate taking Tylenol and ibuprofen as needed for pain. You can take 650mg  tylenol (acetaminophen) every 4-6 hours, and 600 mg ibuprofen 3 times a day.   Please follow up with your primary care provider within 1 week.   Do not hesitate to return to the ED or call 911 if you experience: -Worsening symptoms -Signs of infection including redness, pus drainage, increased pain/swelling -Lightheadedness, passing out -Fevers/chills -Anything else that concerns you

## 2022-07-25 NOTE — ED Notes (Signed)
ED Provider at bedside. 

## 2022-07-25 NOTE — ED Triage Notes (Signed)
EMS also states that patient has upcoming knee replacement,  pt. thinks fall is related to previous pain and weakness of that right knee. Pt states concern for how this will affect her upcoming surgery on Aug 2nd.

## 2022-07-25 NOTE — ED Provider Notes (Signed)
..Laceration Repair  Date/Time: 07/25/2022 11:27 PM  Performed by: Jeannie Fend, PA-C Authorized by: Jeannie Fend, PA-C   Consent:    Consent obtained:  Verbal   Consent given by:  Patient   Risks, benefits, and alternatives were discussed: yes     Risks discussed:  Infection, need for additional repair, pain, poor cosmetic result, poor wound healing, tendon damage and retained foreign body   Alternatives discussed:  No treatment Universal protocol:    Patient identity confirmed:  Verbally with patient Anesthesia:    Anesthesia method:  Local infiltration   Local anesthetic:  Lidocaine 2% WITH epi Laceration details:    Location:  Leg   Leg location:  R knee   Length (cm):  11   Depth (mm):  10 Pre-procedure details:    Preparation:  Patient was prepped and draped in usual sterile fashion and imaging obtained to evaluate for foreign bodies Exploration:    Hemostasis achieved with:  Epinephrine   Imaging obtained: x-ray     Imaging obtained comment:  CT   Imaging outcome: foreign body noted     Wound exploration: wound explored through full range of motion     Wound extent: no foreign body, no signs of injury, no tendon damage, no underlying fracture and no vascular damage     Contaminated: yes (glitter)   Treatment:    Area cleansed with:  Saline   Amount of cleaning:  Extensive   Irrigation solution:  Sterile saline   Irrigation volume:    Irrigation method:  Pressure wash   Visualized foreign bodies/material removed: yes     Debridement:  None   Undermining:  None Skin repair:    Repair method:  Sutures   Suture size:  3-0   Suture material:  Prolene   Suture technique:  Simple interrupted   Number of sutures:  11 Approximation:    Approximation:  Loose Repair type:    Repair type:  Intermediate Post-procedure details:    Dressing:  Bulky dressing   Procedure completion:  Tolerated well, no immediate complications .Marland KitchenLaceration Repair  Date/Time:  07/25/2022 11:29 PM  Performed by: Jeannie Fend, PA-C Authorized by: Jeannie Fend, PA-C   Consent:    Consent obtained:  Verbal   Consent given by:  Patient   Risks discussed:  Infection, need for additional repair, nerve damage, poor wound healing, poor cosmetic result, retained foreign body and pain   Alternatives discussed:  No treatment Universal protocol:    Patient identity confirmed:  Verbally with patient Anesthesia:    Anesthesia method:  Local infiltration   Local anesthetic:  Lidocaine 2% WITH epi Laceration details:    Location:  Leg   Leg location:  R knee   Length (cm):  6   Depth (mm):  10 Pre-procedure details:    Preparation:  Patient was prepped and draped in usual sterile fashion and imaging obtained to evaluate for foreign bodies Exploration:    Hemostasis achieved with:  Epinephrine   Imaging obtained: x-ray     Imaging obtained comment:  CT   Wound exploration: wound explored through full range of motion     Wound extent: foreign bodies/material     Wound extent: no signs of injury, no tendon damage, no underlying fracture and no vascular damage     Foreign bodies/material:  Glass   Contaminated: yes   Treatment:    Area cleansed with:  Saline   Amount of cleaning:  Extensive  Irrigation solution:  Sterile saline   Irrigation volume:    Irrigation method:  Pressure wash Skin repair:    Repair method:  Sutures and tissue adhesive   Suture size:  3-0   Suture material:  Prolene   Suture technique:  Simple interrupted   Number of sutures:  8 Approximation:    Approximation:  Loose Repair type:    Repair type:  Intermediate Post-procedure details:    Dressing:  Bulky dressing   Procedure completion:  Tolerated well, no immediate complications     Jeannie Fend, PA-C 07/25/22 2330    Loetta Rough, MD 07/26/22 [phone]
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
## 2022-07-25 NOTE — ED Triage Notes (Signed)
Pt arrives via [facility] from home, had a fall hitting a glass vase. Pt arrives with area wrapped by EMS has large deep lacerations to right knee. EMS documented right pedal pulse on patient.

## 2022-07-26 NOTE — ED Notes (Signed)
[facility] notified for transportation back home

## 2022-07-31 ENCOUNTER — Ambulatory Visit: Payer: Medicaid Other | Admitting: Orthopaedic Surgery

## 2022-08-02 ENCOUNTER — Encounter: Payer: Self-pay | Admitting: Student

## 2022-08-02 ENCOUNTER — Telehealth: Payer: Self-pay | Admitting: Orthopaedic Surgery

## 2022-08-02 ENCOUNTER — Ambulatory Visit (INDEPENDENT_AMBULATORY_CARE_PROVIDER_SITE_OTHER): Payer: MEDICAID | Admitting: Student

## 2022-08-02 VITALS — BP 121/77 | HR 95 | Ht 65.0 in | Wt 247.0 lb

## 2022-08-02 DIAGNOSIS — S8991XS Unspecified injury of right lower leg, sequela: Secondary | ICD-10-CM

## 2022-08-02 DIAGNOSIS — Z4802 Encounter for removal of sutures: Secondary | ICD-10-CM

## 2022-08-02 MED ORDER — DOXYCYCLINE HYCLATE 100 MG PO TABS: 100.0000 mg | ORAL_TABLET | Freq: Two times a day (BID) | ORAL | 0 refills | Status: DC

## 2022-08-02 NOTE — Telephone Encounter (Signed)
Patient aware of the below message. She was not happy about the BMI but I told her it is this way due to possible infections with higher BMI's

## 2022-08-02 NOTE — Patient Instructions (Addendum)
It was great seeing you today.  As we discussed, - Please start taking Doxycycline 100 mg twice daily for 10 days. I sent this to your pharmacy. - You may increase your pain medicine to 2 tablets in the morning, 1 tablet in the afternoon, 1 tablet in the evening. - Please call Dr. Magnus Ivan (orthopedic surgery)  to have your knee looked at ASAP.  You may apply neosporin to the knee area but avoid using hydrogen peroxide. Clean the area   If you develop worsening pain, swelling, pain, discharge from your incision site, please go seek urgent    If you have any questions or concerns, please feel free to call the clinic.   Have a wonderful day,  Dr. Darral Dash [facility] [phone]

## 2022-08-02 NOTE — Progress Notes (Signed)
    SUBJECTIVE:   CHIEF COMPLAINT / HPI:   Holly Mendez is a pleasant 47 year old female here to follow-up for suture removal s/p fall.  She fell down 2 steps and went to the ED on 7/16 in which she had 18 sutures placed over her laceration.    She noticed that the area has been red, swollen and tender.  No fevers or chills.  She does have some pain in her right knee with flexion.  She feels like sometimes she cannot even lay down because of the pain being so severe She has been using hydrogen peroxide 3 times a day on the wound. She is also currently taking Flagyl for her bacterial vaginosis.  PERTINENT  PMH / PSH: Obesity, recurrent BV, OA of right knee  OBJECTIVE:   BP 121/77   Pulse 95   Ht 5\' 5"  (1.651 m)   Wt 247 lb (112 kg)   LMP 08/31/2018   SpO2 100%   BMI 41.10 kg/m   General: Pleasant, no distress, ambulates with cane Respiratory: No increased WOB, lungs clear throughout Right knee: Sutures in place, some with tension and some without.  Scabrous tissue over medial upper area.   ASSESSMENT/PLAN:   Right knee injury, sequela The knee looks like there may be some underlying infection with erythema, swelling and will plan to treat with doxycycline 100 mg twice daily for 10 days. Discussed return precautions should she develop fever, chills, purulence or drainage. I discussed with her PCP that it is appropriate to increase her hydrocodone-acetaminophen 5-325 to 2 tablets in the morning, 1 in the afternoon 1 in the evening (previously taking 1 tablet 3 times daily)  With Dr. McDiarmid, we removed all sutures, counts correct.  Topical antibiotic ointment applied with petroleum gauze dressing and Ace wrap.  Encouraged her to keep the area clean and dry with application of antibiotic ointment daily and dressing change.   Documented as having 18 sutures placed in right knee Removed today with Dr. McDiarmid  Increased pain meds to 2 tablets daily, 1 in the afternoon,  1 in the evening.  Darral Dash, DO [facility]

## 2022-08-02 NOTE — Telephone Encounter (Signed)
Patient called asked if Dr. Magnus Ivan would call her concerning scheduling surgery. Patient said she fell on a glass vase last week and have 52 stitches in her right knee. Patient asked if she can get a sooner appointment so Dr. Magnus Ivan or Bronson Curb can look at her knee?  Patient said her knee gives out and causing her to fall.  The number to contact patient is [phone]

## 2022-08-03 DIAGNOSIS — S8991XS Unspecified injury of right lower leg, sequela: Secondary | ICD-10-CM | POA: Insufficient documentation

## 2022-08-03 NOTE — Assessment & Plan Note (Addendum)
The knee looks like there may be some underlying infection with erythema, swelling and will plan to treat with doxycycline 100 mg twice daily for 10 days. Discussed return precautions should she develop fever, chills, purulence or drainage. I discussed with her PCP that it is appropriate to increase her hydrocodone-acetaminophen 5-325 to 2 tablets in the morning, 1 in the afternoon 1 in the evening (previously taking 1 tablet 3 times daily)  With Dr. McDiarmid, we removed all sutures, counts correct.  Topical antibiotic ointment applied with petroleum gauze dressing and Ace wrap.  Encouraged her to keep the area clean and dry with application of antibiotic ointment daily and dressing change.

## 2022-08-07 ENCOUNTER — Telehealth: Payer: Self-pay

## 2022-08-07 NOTE — Telephone Encounter (Signed)
Patient calls nurse line requesting new prescription for hydrocodone- acetaminophen.   She reports that Dr. Melissa Noon increased prescription at last office visit on 7/24. Per note, patient is to be taking 2 tablets in the AM, 1 tablet in the afternoon and 1 tablet in the PM.   She states that pharmacy needs a new prescription with these updated directions.   Please send to [facility].   Sending to both Dr. Jennette Kettle and Dr. Melissa Noon for review.   [name], RN

## 2022-08-08 ENCOUNTER — Telehealth: Payer: Self-pay

## 2022-08-08 ENCOUNTER — Ambulatory Visit (INDEPENDENT_AMBULATORY_CARE_PROVIDER_SITE_OTHER): Payer: MEDICAID | Admitting: Student

## 2022-08-08 VITALS — BP 118/85 | HR 88 | Ht 65.0 in | Wt 245.2 lb

## 2022-08-08 DIAGNOSIS — S8991XA Unspecified injury of right lower leg, initial encounter: Secondary | ICD-10-CM

## 2022-08-08 DIAGNOSIS — S8991XS Unspecified injury of right lower leg, sequela: Secondary | ICD-10-CM | POA: Diagnosis not present

## 2022-08-08 DIAGNOSIS — R296 Repeated falls: Secondary | ICD-10-CM

## 2022-08-08 MED ORDER — HYDROCODONE-ACETAMINOPHEN 5-325 MG PO TABS: ORAL_TABLET | ORAL | 0 refills | Status: DC

## 2022-08-08 NOTE — Progress Notes (Signed)
    SUBJECTIVE:   CHIEF COMPLAINT / HPI:   Holly Mendez is a 47 year-old female here for follow-up of right knee laceration. She had suture removal on 08/02/22, and started on Doxycycline 100 mg BID for 10 days which she is in the midst of completing.  Pain is much improved. Swelling improved. No fevers or chills. Has been keeping the area clean, dry and dressed with petroleum gauze.   OBJECTIVE:   BP 118/85   Pulse 88   Ht 5\' 5"  (1.651 m)   Wt 245 lb 3.2 oz (111.2 kg)   LMP 08/31/2018   SpO2 100%   BMI 40.80 kg/m   General: Well-appearing, no distress Right knee: Well-healing granulation tissues with less scabrous tissue compared to last visit. No drainage appreciated. Good ROM with knee flexion and extension    ASSESSMENT/PLAN:   Right knee injury, sequela Much improved healing wound with granulation tissue. No concern for cellulitis or deep soft tissue infection Continue 10 day doxycycline course to completion (7/24-8/3) Continue wound care with antibacterial soap, vaseline jelly and petroleum gauze with loose wrap Return precautions discussed for purulence, drainage, fever, chills, worsening pain or erythema Return in 1 month to see PCP   DME order placed for home rolator  Darral Dash, DO [facility]

## 2022-08-08 NOTE — Patient Instructions (Addendum)
It was great seeing you today.  As we discussed, -Your wound is healing very well.  Continue to apply Vaseline/antibiotic ointment on the area and keep it clean. -Please complete your antibiotic course.  Schedule an appointment with your PCP in 1 month for follow-up.  If you have any concerns between now and then with increased pain, drainage, swelling or fever or chills please return sooner.   If you have any questions or concerns, please feel free to call the clinic.   Have a wonderful day,  Dr. Darral Dash [facility] [phone]

## 2022-08-08 NOTE — Assessment & Plan Note (Signed)
Much improved healing wound with granulation tissue. No concern for cellulitis or deep soft tissue infection Continue 10 day doxycycline course to completion (7/24-8/3) Continue wound care with antibacterial soap, vaseline jelly and petroleum gauze with loose wrap Return precautions discussed for purulence, drainage, fever, chills, worsening pain or erythema Return in 1 month to see PCP

## 2022-08-08 NOTE — Telephone Encounter (Signed)
I sent iti  n THANKS! Denny Levy

## 2022-08-08 NOTE — Telephone Encounter (Signed)
Patient is requesting a rolator.   She reports her cane is not providing good stability.   Patient will need office notes stating the need of DME.   I see she has an apt this afternoon with Dameron.

## 2022-08-11 NOTE — Telephone Encounter (Signed)
Community message sent to [employer]. Will await response.    C , RN  

## 2022-08-11 NOTE — Progress Notes (Signed)
DME order placed during 7/30 office visit for rollator.  Patient would benefit from the use of rollator walker due to limited mobility, increased falls increasing her risk for poor outcomes without device assistance with ambulation.  Darral Dash, DO PGY-3 [facility]

## 2022-08-14 ENCOUNTER — Ambulatory Visit: Payer: Medicaid Other | Admitting: Physician Assistant

## 2022-08-14 ENCOUNTER — Ambulatory Visit: Payer: MEDICAID | Admitting: Physician Assistant

## 2022-08-14 ENCOUNTER — Telehealth: Payer: Self-pay

## 2022-08-14 NOTE — Telephone Encounter (Signed)
Received the following message from [employer].   Patient received a quad cane in 04/2022. insurance will not cover another ambulatory aid for 3 years.   Advised patient of message. Patient verbalizes understanding and will reach out to [employer] to determine retail cost.   [name], RN

## 2022-08-14 NOTE — Telephone Encounter (Signed)
Patient calls nurse line stating that pain medication is needing prior authorization.   PA submitted for hydrocodone-acetaminophen 5-325 mg tablets via covermymeds.   NFA:[id number]

## 2022-08-14 NOTE — Telephone Encounter (Signed)
PA approved through 02/13/2023.  Called pharmacy with approval. Called patient and provided update.   [name], RN

## 2022-08-16 ENCOUNTER — Ambulatory Visit (INDEPENDENT_AMBULATORY_CARE_PROVIDER_SITE_OTHER): Payer: MEDICAID | Admitting: Family Medicine

## 2022-08-16 ENCOUNTER — Encounter: Payer: Self-pay | Admitting: Family Medicine

## 2022-08-16 VITALS — BP 116/78 | HR 92 | Wt 245.0 lb

## 2022-08-16 DIAGNOSIS — M1711 Unilateral primary osteoarthritis, right knee: Secondary | ICD-10-CM

## 2022-08-16 NOTE — Progress Notes (Signed)
    CHIEF COMPLAINT / HPI: Follow-up right knee laceration: Improving.  Her orthopedic surgeon was supposed to see her back this month but called her and canceled the appointment.  He said they would be no intervention at this time given the extent of her laceration and really he does not want to do any surgery so he is going to leave it to her whether or not to follow-up.  He really wants her to lose weight before they do any type of knee surgery if possible.  She is resigned to this but still concerned because the laceration resulted from a fall because her knee buckled. 2.  He had given her some Relafen which did seem to help when she had the acute pain.  She stopped taking it about a week ago.  Notably she did not have any bad reaction to that like she did to the Celebrex.   PERTINENT  PMH / PSH: I have reviewed the patient's medications, allergies, past medical and surgical history, smoking status and updated in the EMR as appropriate.   OBJECTIVE:  BP 116/78   Pulse 92   Wt 245 lb (111.1 kg)   LMP 08/31/2018   SpO2 98%   BMI 40.77 kg/m   GENERAL: Well-developed no acute distress KNEE: Right.  Healing jagged complex laceration lateral portion of right knee.  No sign of infection.  There is still fair amount of induration but no fluctuance.  She does not have quite full extension but she did not have full extension comfortably before the accident so she is back to baseline.  Popliteal space is benign. ASSESSMENT / PLAN:   Osteoarthritis of right knee Significant OA right knee now complicated by complex laceration that is healing well.  We discussed her options at this moment which are not many.  I would like to see her back in 4 to 6 weeks and assure that this is totally healed then we plan to retry formal physical therapy.   Denny Levy MD

## 2022-08-16 NOTE — Assessment & Plan Note (Signed)
Significant OA right knee now complicated by complex laceration that is healing well.  We discussed her options at this moment which are not many.  I would like to see her back in 4 to 6 weeks and assure that this is totally healed then we plan to retry formal physical therapy.

## 2022-08-21 ENCOUNTER — Ambulatory Visit: Payer: Medicaid Other | Admitting: Physician Assistant

## 2022-08-25 ENCOUNTER — Other Ambulatory Visit: Payer: Self-pay | Admitting: Family Medicine

## 2022-09-02 ENCOUNTER — Other Ambulatory Visit: Payer: Self-pay | Admitting: Family Medicine

## 2022-09-04 NOTE — Telephone Encounter (Signed)
PT was called on 06/06/22 due to 2nd no show. Message left.

## 2022-09-13 ENCOUNTER — Encounter: Payer: Self-pay | Admitting: Family Medicine

## 2022-09-13 ENCOUNTER — Ambulatory Visit: Payer: MEDICAID | Admitting: Family Medicine

## 2022-09-13 VITALS — BP 117/77 | HR 85 | Ht 65.0 in | Wt 258.0 lb

## 2022-09-13 DIAGNOSIS — M1711 Unilateral primary osteoarthritis, right knee: Secondary | ICD-10-CM

## 2022-09-13 DIAGNOSIS — Z23 Encounter for immunization: Secondary | ICD-10-CM | POA: Diagnosis not present

## 2022-09-14 NOTE — Progress Notes (Signed)
    CHIEF COMPLAINT / HPI:  F/u right knee pain and right leg/knee laceration. Feels like it is healed enough to go for PT now. Worried about the scars as they seem pretty vivid and big to her   PERTINENT  PMH / PSH: I have reviewed the patient's medications, allergies, past medical and surgical history, smoking status and updated in the EMR as appropriate.   OBJECTIVE:  BP 117/77   Pulse 85   Ht 5\' 5"  (1.651 m)   Wt 258 lb (117 kg)   LMP 08/31/2018   SpO2 100%   BMI 42.93 kg/m  GENERAL: Well-developed female no acute distress KNEE: Right.  Has some pain with full extension but can attain full extension.  Multiple lacerations are healing well although they look like they may be forming keloids.  Picture copied to media tab.  No sign of infection.  The calf is soft.  Popliteal space is benign.  Distally she is neurovascularly intact.  ASSESSMENT / PLAN:   Osteoarthritis of right knee Lacerations are healed enough for her to start physical therapy.  Will set that up and see her back in about a month.  She is not having to use quite as much pain medicine down so she does not need a refill but once she starts back in PT that may change.  She will call let me know if it does.   Denny Levy MD

## 2022-09-14 NOTE — Assessment & Plan Note (Signed)
Lacerations are healed enough for her to start physical therapy.  Will set that up and see her back in about a month.  She is not having to use quite as much pain medicine down so she does not need a refill but once she starts back in PT that may change.  She will call let me know if it does.
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
## 2022-09-25 ENCOUNTER — Telehealth: Payer: Self-pay | Admitting: Orthopaedic Surgery

## 2022-09-25 NOTE — Telephone Encounter (Signed)
Pt would like a letter stating when she started seeing Magnus Ivan for her knees and what type of injections she is getting for treatment and that she is still currently an active patient

## 2022-09-25 NOTE — Telephone Encounter (Signed)
Ok for this note. Looks like she has cancelled many appointments

## 2022-09-26 ENCOUNTER — Ambulatory Visit (HOSPITAL_COMMUNITY): Payer: MEDICAID | Admitting: Physical Therapy

## 2022-09-26 ENCOUNTER — Encounter (HOSPITAL_BASED_OUTPATIENT_CLINIC_OR_DEPARTMENT_OTHER): Payer: Self-pay
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
## 2022-09-26 NOTE — Telephone Encounter (Signed)
Called and advised letter was completed. She will pick it up at her next appt

## 2022-10-02 ENCOUNTER — Other Ambulatory Visit: Payer: Self-pay | Admitting: Family Medicine

## 2022-10-09 ENCOUNTER — Ambulatory Visit (INDEPENDENT_AMBULATORY_CARE_PROVIDER_SITE_OTHER): Payer: MEDICAID | Admitting: Orthopaedic Surgery

## 2022-10-09 ENCOUNTER — Other Ambulatory Visit: Payer: Self-pay | Admitting: Family Medicine

## 2022-10-09 VITALS — Ht 65.0 in | Wt 249.0 lb

## 2022-10-09 DIAGNOSIS — M1712 Unilateral primary osteoarthritis, left knee: Secondary | ICD-10-CM

## 2022-10-09 DIAGNOSIS — M25561 Pain in right knee: Secondary | ICD-10-CM

## 2022-10-09 DIAGNOSIS — M25562 Pain in left knee: Secondary | ICD-10-CM | POA: Diagnosis not present

## 2022-10-09 DIAGNOSIS — M1711 Unilateral primary osteoarthritis, right knee: Secondary | ICD-10-CM

## 2022-10-09 DIAGNOSIS — G8929 Other chronic pain: Secondary | ICD-10-CM | POA: Diagnosis not present

## 2022-10-09 MED ORDER — LIDOCAINE HCL 1 % IJ SOLN
3.0000 mL | INTRAMUSCULAR | Status: AC | PRN
Start: 2022-10-09 — End: 2022-10-09
  Administered 2022-10-09: 3 mL

## 2022-10-09 MED ORDER — NABUMETONE 500 MG PO TABS: 500.0000 mg | ORAL_TABLET | Freq: Two times a day (BID) | ORAL | 3 refills | Status: DC | PRN

## 2022-10-09 MED ORDER — METHYLPREDNISOLONE ACETATE 40 MG/ML IJ SUSP
40.0000 mg | INTRAMUSCULAR | Status: AC | PRN
Start: 2022-10-09 — End: 2022-10-09
  Administered 2022-10-09: 40 mg via INTRA_ARTICULAR

## 2022-10-09 NOTE — Progress Notes (Signed)
The patient comes in requesting steroid injections in both her knees.  She is 47 years old.  We last injected her knees 5 months ago.  In July she had a fall on the coffee table at her house and she ended up having significant lacerations to her right knee that had to be repaired in the emergency room but any pain.  There is a send healed.  She is working on weight loss still.  She does need a refill of November time as well.  She is requesting steroid injections in both her knees today.  She has had no acute change otherwise in her medical status and is actually starting physical therapy soon.  Examination of both knees she has slight valgus malalignment.  The incisions on the right knee have healed completely.  There is no evidence of infection and no effusion with either knee.  Both knees have good range of motion but global tenderness and crepitation.  I did place a steroid injection per her request in both knees today.  She knows to wait at least 3 months between injections.  She will continue to work on quad strengthening exercises and weight loss.  Follow-up is as needed.  I did send in some more Relafen.    Procedure Note  Patient: Holly Mendez             Date of Birth: [birth year]           MRN: [id number]             Visit Date: 10/09/2022  Procedures: Visit Diagnoses:  1. Chronic pain of left knee   2. Unilateral primary osteoarthritis, left knee   3. Chronic pain of right knee   4. Unilateral primary osteoarthritis, right knee     Large Joint Inj: R knee on 10/09/2022 9:04 AM Indications: diagnostic evaluation and pain Details: 22 G 1.5 in needle, superolateral approach  Arthrogram: No  Medications: 3 mL lidocaine 1 %; 40 mg methylPREDNISolone acetate 40 MG/ML Outcome: tolerated well, no immediate complications Procedure, treatment alternatives, risks and benefits explained, specific risks discussed. Consent was given by the patient. Immediately prior to procedure a time  out was called to verify the correct patient, procedure, equipment, support staff and site/side marked as required. Patient was prepped and draped in the usual sterile fashion.    Large Joint Inj: L knee on 10/09/2022 9:04 AM Indications: diagnostic evaluation and pain Details: 22 G 1.5 in needle, superolateral approach  Arthrogram: No  Medications: 3 mL lidocaine 1 %; 40 mg methylPREDNISolone acetate 40 MG/ML Outcome: tolerated well, no immediate complications Procedure, treatment alternatives, risks and benefits explained, specific risks discussed. Consent was given by the patient. Immediately prior to procedure a time out was called to verify the correct patient, procedure, equipment, support staff and site/side marked as required. Patient was prepped and draped in the usual sterile fashion.

## 2022-10-10 ENCOUNTER — Ambulatory Visit (HOSPITAL_COMMUNITY): Payer: MEDICAID | Attending: Family Medicine

## 2022-10-10 DIAGNOSIS — M1711 Unilateral primary osteoarthritis, right knee: Secondary | ICD-10-CM | POA: Diagnosis not present

## 2022-10-10 DIAGNOSIS — M25562 Pain in left knee: Secondary | ICD-10-CM | POA: Insufficient documentation

## 2022-10-10 DIAGNOSIS — M25561 Pain in right knee: Secondary | ICD-10-CM | POA: Diagnosis present

## 2022-10-10 DIAGNOSIS — R2689 Other abnormalities of gait and mobility: Secondary | ICD-10-CM | POA: Diagnosis present

## 2022-10-10 DIAGNOSIS — R2681 Unsteadiness on feet: Secondary | ICD-10-CM | POA: Diagnosis present
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
## 2022-10-10 NOTE — Telephone Encounter (Signed)
Patient calls nurse line regarding this request.   Please advise.   [name], RN

## 2022-10-12 ENCOUNTER — Telehealth (HOSPITAL_BASED_OUTPATIENT_CLINIC_OR_DEPARTMENT_OTHER): Payer: Self-pay | Admitting: Orthopaedic Surgery

## 2022-10-12 NOTE — Telephone Encounter (Signed)
Patient states that she can move her knee since her injection yesterday. Patient best contact number [phone]

## 2022-10-12 NOTE — Telephone Encounter (Signed)
Spoke with patient and let her know to use ice and anti inflammatories for a couple days that this is a common complaint after a joint injection

## 2022-10-17 ENCOUNTER — Encounter (HOSPITAL_COMMUNITY): Payer: Self-pay

## 2022-10-17 ENCOUNTER — Ambulatory Visit (HOSPITAL_COMMUNITY): Payer: MEDICAID

## 2022-10-17 DIAGNOSIS — M25561 Pain in right knee: Secondary | ICD-10-CM | POA: Diagnosis not present

## 2022-10-17 DIAGNOSIS — R2689 Other abnormalities of gait and mobility: Secondary | ICD-10-CM

## 2022-10-17 DIAGNOSIS — R2681 Unsteadiness on feet: Secondary | ICD-10-CM

## 2022-10-17 DIAGNOSIS — M25562 Pain in left knee: Secondary | ICD-10-CM
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
## 2022-10-20 ENCOUNTER — Encounter (HOSPITAL_COMMUNITY): Payer: MEDICAID

## 2022-10-24 ENCOUNTER — Encounter (HOSPITAL_COMMUNITY): Payer: MEDICAID

## 2022-10-25 ENCOUNTER — Ambulatory Visit: Payer: MEDICAID | Admitting: Family Medicine

## 2022-10-25 ENCOUNTER — Encounter: Payer: Self-pay | Admitting: Family Medicine

## 2022-10-25 VITALS — BP 120/70 | HR 94 | Ht 65.0 in | Wt 249.8 lb

## 2022-10-25 DIAGNOSIS — I10 Essential (primary) hypertension: Secondary | ICD-10-CM | POA: Diagnosis not present

## 2022-10-25 DIAGNOSIS — R5383 Other fatigue: Secondary | ICD-10-CM | POA: Diagnosis not present

## 2022-10-25 DIAGNOSIS — S8991XS Unspecified injury of right lower leg, sequela: Secondary | ICD-10-CM | POA: Diagnosis not present

## 2022-10-25 NOTE — Assessment & Plan Note (Signed)
She is still having issues with osteoarthritis of that knee but she is continuing in physical therapy.  From our perspective of the large laceration she had, that is healing without any worrisome sequela.  She is continue to follow-up with orthopedics regarding her OA.

## 2022-10-25 NOTE — Assessment & Plan Note (Signed)
Recheck of her blood pressure today showed better control.  I think it was initially elevated because she was having some pain on the walk back to the exam room.  Will continue current medications.  Check labs today.

## 2022-10-25 NOTE — Patient Instructions (Addendum)
Regarding your medications, let us see if we can DECREASE AND OR taper THE AMITRIPTYLINE  as it was originally given for insomnia. I think the Seroquel is working for that now.  The nabumetone is for inflammation. Take it WITH FOOD! As it can upset your stomach.  Ask your PT about a TENS unit. If they need an order to try it, let me know.

## 2022-10-25 NOTE — Progress Notes (Signed)
    CHIEF COMPLAINT / HPI: Follow-up on knee pain.  She continues in physical therapy.  Having some improvement in her movement but physical therapy itself is causing her a lot of pain.  Her orthopedist gave her 2 injections at last office visit and she has noticed little improvement. 2.She feels cold all the time and is more fatigued than usual.  She wonders if she needs to check her iron as this has been a issue in the past. 3.  Hypertension: She is taking her medicine regularly.  No issues.  No shortness of breath or chest pain. 4.  Has noticed she is having more loose stools recently since she started taking the Relafen.  Wonders if that could be the cause.  She is essentially not having any problems with constipation right now.   PERTINENT  PMH / PSH: I have reviewed the patient's medications, allergies, past medical and surgical history, smoking status and updated in the EMR as appropriate. Known osteoarthritis of right knee  OBJECTIVE:  BP 120/70   Pulse 94   Ht 5\' 5"  (1.651 m)   Wt 249 lb 12.8 oz (113.3 kg)   LMP 08/31/2018   SpO2 100%   BMI 41.57 kg/m  Vital signs reviewed. GENERAL: Well-developed, well-nourished, no acute distress. CARDIOVASCULAR: Regular rate and rhythm  Neck without lymphadenopathy NEURO: No gross focal neurological deficits. MSK: Movement of extremity x 4.   ASSESSMENT / PLAN: Fatigue and sensation of being cold all the time: Will check some lab work today.  Will let her know the results.  Chronic constipation seems resolved currenty.  Hypertension Recheck of her blood pressure today showed better control.  I think it was initially elevated because she was having some pain on the walk back to the exam room.  Will continue current medications.  Check labs today.  Right knee injury, sequela She is still having issues with osteoarthritis of that knee but she is continuing in physical therapy.  From our perspective of the large laceration she had, that  is healing without any worrisome sequela.  She is continue to follow-up with orthopedics regarding her OA.   Denny Levy MD

## 2022-10-26 ENCOUNTER — Other Ambulatory Visit: Payer: Self-pay

## 2022-10-26 LAB — COMPREHENSIVE METABOLIC PANEL
ALT: 15 [IU]/L (ref 0–32)
AST: 14 [IU]/L (ref 0–40)
Albumin: 4.5 g/dL (ref 3.9–4.9)
Alkaline Phosphatase: 91 [IU]/L (ref 44–121)
BUN/Creatinine Ratio: 13 (ref 9–23)
BUN: 11 mg/dL (ref 6–24)
Bilirubin Total: 0.2 mg/dL (ref 0.0–1.2)
CO2: 23 mmol/L (ref 20–29)
Calcium: 9.4 mg/dL (ref 8.7–10.2)
Chloride: 102 mmol/L (ref 96–106)
Creatinine, Ser: 0.85 mg/dL (ref 0.57–1.00)
Globulin, Total: 3 g/dL (ref 1.5–4.5)
Glucose: 109 mg/dL — ABNORMAL HIGH (ref 70–99)
Potassium: 4.2 mmol/L (ref 3.5–5.2)
Sodium: 138 mmol/L (ref 134–144)
Total Protein: 7.5 g/dL (ref 6.0–8.5)
eGFR: 84 mL/min/{1.73_m2} (ref >59)

## 2022-10-26 LAB — CBC
Hematocrit: 37.2 % (ref 34.0–46.6)
Hemoglobin: 12.5 g/dL (ref 11.1–15.9)
MCH: 28.9 pg (ref 26.6–33.0)
MCHC: 33.6 g/dL (ref 31.5–35.7)
MCV: 86 fL (ref 79–97)
Platelets: 265 10*3/uL (ref 150–450)
RBC: 4.32 x10E6/uL (ref 3.77–5.28)
RDW: 13.6 % (ref 11.7–15.4)
WBC: 5.6 10*3/uL (ref 3.4–10.8)

## 2022-10-26 LAB — IRON,TIBC AND FERRITIN PANEL
Ferritin: 61 ng/mL (ref 15–150)
Iron Saturation: 24 % (ref 15–55)
Iron: 94 ug/dL (ref 27–159)
Total Iron Binding Capacity: 400 ug/dL (ref 250–450)
UIBC: 306 ug/dL (ref 131–425)

## 2022-10-26 LAB — TSH: TSH: 2.89 u[IU]/mL (ref 0.450–4.500)

## 2022-10-26 MED ORDER — HYDROCODONE-ACETAMINOPHEN 5-325 MG PO TABS: ORAL_TABLET | ORAL | 0 refills | Status: DC

## 2022-10-26 NOTE — Telephone Encounter (Signed)
Patient calls nurse line requesting a refill on Norco prescription.   Last fill date 9/23.  Will forward to PCP.

## 2022-10-27 ENCOUNTER — Encounter: Payer: Self-pay | Admitting: Family Medicine

## 2022-11-01 ENCOUNTER — Telehealth: Payer: Self-pay | Admitting: Orthopaedic Surgery

## 2022-11-01 ENCOUNTER — Ambulatory Visit (HOSPITAL_COMMUNITY): Payer: MEDICAID

## 2022-11-01 DIAGNOSIS — M25561 Pain in right knee: Secondary | ICD-10-CM

## 2022-11-01 DIAGNOSIS — R2689 Other abnormalities of gait and mobility: Secondary | ICD-10-CM

## 2022-11-01 DIAGNOSIS — M25562 Pain in left knee: Secondary | ICD-10-CM

## 2022-11-01 DIAGNOSIS — R2681 Unsteadiness on feet: Secondary | ICD-10-CM

## 2022-11-01 NOTE — Telephone Encounter (Signed)
Patient called stating bil knees are swollen and inflamed. Patient states nabumetone 500 mg isn't helping with pain. Patient would like to try something else for pain. Sydnee Cabal [phone]
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
## 2022-11-03 ENCOUNTER — Telehealth: Payer: Self-pay

## 2022-11-03 NOTE — Telephone Encounter (Signed)
Patient calls nurse line requesting order for TENS unit.   She was seen at [facility] on 11/01/22. She was advised that they would not be able to provide her with TENS unit and reach back out to PCP.   Will forward to PCP to place new DME order for this device.   [name], RN

## 2022-11-06 ENCOUNTER — Other Ambulatory Visit (HOSPITAL_COMMUNITY): Payer: Self-pay | Admitting: Family Medicine

## 2022-11-06 DIAGNOSIS — Z1231 Encounter for screening mammogram for malignant neoplasm of breast: Secondary | ICD-10-CM

## 2022-11-07 ENCOUNTER — Telehealth: Payer: Self-pay | Admitting: Orthopaedic Surgery

## 2022-11-07 NOTE — Telephone Encounter (Signed)
Patient calls nurse line in requesting an update on DME order.   Advised will call her once the order is placed.   She also reports she had a fall last night in her bathroom. She reports it took her ~40 minutes to get up off the floor. She reports she has a "big" bruise on her knee.  She reports she did not lose consciousness.   She has already spoken with Dr. Doroteo Glassman office and reported the incident and reports she has an apt with them on 10/31.

## 2022-11-07 NOTE — Telephone Encounter (Signed)
Patient called and said that she wants to let you know her knees locked up on her and she fell in the bathroom and has a big bruise on her left knee. CB#[phone]

## 2022-11-07 NOTE — Telephone Encounter (Signed)
Glad she has appt with Dr Magnus Ivan. Thanks for the info! Denny Levy

## 2022-11-07 NOTE — Telephone Encounter (Signed)
noted 

## 2022-11-08 ENCOUNTER — Ambulatory Visit (HOSPITAL_COMMUNITY): Payer: MEDICAID

## 2022-11-08 DIAGNOSIS — M25561 Pain in right knee: Secondary | ICD-10-CM

## 2022-11-08 DIAGNOSIS — M25562 Pain in left knee: Secondary | ICD-10-CM

## 2022-11-08 DIAGNOSIS — R2681 Unsteadiness on feet: Secondary | ICD-10-CM

## 2022-11-08 DIAGNOSIS — R2689 Other abnormalities of gait and mobility: Secondary | ICD-10-CM
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
## 2022-11-09 ENCOUNTER — Other Ambulatory Visit (INDEPENDENT_AMBULATORY_CARE_PROVIDER_SITE_OTHER): Payer: MEDICAID

## 2022-11-09 ENCOUNTER — Encounter: Payer: Self-pay | Admitting: Physician Assistant

## 2022-11-09 ENCOUNTER — Ambulatory Visit: Payer: MEDICAID | Admitting: Physician Assistant

## 2022-11-09 VITALS — Ht 65.0 in | Wt 259.6 lb

## 2022-11-09 DIAGNOSIS — M25562 Pain in left knee: Secondary | ICD-10-CM | POA: Diagnosis not present

## 2022-11-09 DIAGNOSIS — G8929 Other chronic pain: Secondary | ICD-10-CM

## 2022-11-09 DIAGNOSIS — M25561 Pain in right knee: Secondary | ICD-10-CM

## 2022-11-09 MED ORDER — METHYLPREDNISOLONE 4 MG PO TABS: ORAL_TABLET | ORAL | 0 refills | Status: DC

## 2022-11-09 NOTE — Progress Notes (Signed)
HPI: Mrs. Veach comes in due to bilateral knee pain.  She states she had a new fall on 11/07/2022.  States that her left knee buckled.  Currently her left knee is more painful than her right.  She notes she walks with a limp.  She was unable to tolerate the nabumetone due to diarrhea and had no relief.  She has tried other NSAIDs and has had swelling and hives.  She last had cortisone injections both knees on 10/09/2022.  She notes that the cortisone injections in her knees really have not helped.  She did find prednisone was given to her in the ER due to the hives to help with her knee pain and is asking for prescription for prednisone.  Prior MRI right knee showed full-thickness cartilage loss within the posterior medial femoral condyle.  MRI of the left knee in 2023 showed mild thinning medial tibial femoral compartment.  She does note that she has found therapy to be beneficial.  She is also planning on [facility] [employer]  Review of systems: See HPI otherwise negative  Physical exam: Height 5 foot 5 inches weight 259 pounds BMI 43.1   General Well-developed well-nourished female in no acute distress mood and affect appropriate. Respiratory: No acute distress. Bilateral knees: Good range of motion of both knees.  No abnormal warmth erythema.  Slight valgus deformity of both knees.  Negative for instability valgus varus stressing.  No ecchymosis or effusion of either knee.  Radiographs: Left knee no fracture.  Knee is well located.  No acute findings.  Mild patellofemoral arthritic changes.  Right knee 2 views: Knee is well located.  No acute fractures or acute findings.  Impression: Bilateral knee osteoarthritis  Plan: She will continue to work on strengthening.  We will try to get an extension on her physical therapy for strengthening bilateral lower extremities that she has found this beneficial.  She also needs to gain a home exercise program.  Will place her on a short 6-day steroid Dosepak.   She knows to wait at least 3 months between steroid injections both knees.  Follow-up with [name] in 3 months see how she is doing overall height and weight check at that time.  Questions were encouraged and answered.

## 2022-11-10 ENCOUNTER — Other Ambulatory Visit: Payer: Self-pay | Admitting: Radiology

## 2022-11-10 DIAGNOSIS — G8929 Other chronic pain: Secondary | ICD-10-CM

## 2022-11-15 ENCOUNTER — Ambulatory Visit (HOSPITAL_COMMUNITY): Payer: MEDICAID

## 2022-11-15 ENCOUNTER — Encounter (HOSPITAL_COMMUNITY): Payer: MEDICAID | Admitting: Physical Therapy

## 2022-11-19 ENCOUNTER — Emergency Department (HOSPITAL_COMMUNITY)
Admission: EM | Admit: 2022-11-19 | Discharge: 2022-11-19 | Disposition: A | Discharge status: Home / Self Care | Payer: MEDICAID | Attending: Emergency Medicine | Admitting: Emergency Medicine

## 2022-11-19 ENCOUNTER — Encounter (HOSPITAL_COMMUNITY): Payer: Self-pay | Admitting: *Deleted

## 2022-11-19 ENCOUNTER — Emergency Department (HOSPITAL_COMMUNITY): Payer: MEDICAID

## 2022-11-19 ENCOUNTER — Other Ambulatory Visit: Payer: Self-pay

## 2022-11-19 ENCOUNTER — Encounter (HOSPITAL_COMMUNITY): Payer: Self-pay

## 2022-11-19 ENCOUNTER — Emergency Department (HOSPITAL_COMMUNITY)
Admission: EM | Admit: 2022-11-19 | Discharge: 2022-11-19 | Discharge status: Against Medical Advice | Payer: MEDICAID | Attending: Emergency Medicine | Admitting: Emergency Medicine

## 2022-11-19 DIAGNOSIS — S52615A Nondisplaced fracture of left ulna styloid process, initial encounter for closed fracture: Secondary | ICD-10-CM | POA: Insufficient documentation

## 2022-11-19 DIAGNOSIS — S52602A Unspecified fracture of lower end of left ulna, initial encounter for closed fracture: Secondary | ICD-10-CM | POA: Insufficient documentation

## 2022-11-19 DIAGNOSIS — Z79899 Other long term (current) drug therapy: Secondary | ICD-10-CM | POA: Insufficient documentation

## 2022-11-19 DIAGNOSIS — M7989 Other specified soft tissue disorders: Secondary | ICD-10-CM | POA: Insufficient documentation

## 2022-11-19 DIAGNOSIS — I10 Essential (primary) hypertension: Secondary | ICD-10-CM | POA: Insufficient documentation

## 2022-11-19 DIAGNOSIS — Z5321 Procedure and treatment not carried out due to patient leaving prior to being seen by health care provider: Secondary | ICD-10-CM | POA: Diagnosis not present

## 2022-11-19 DIAGNOSIS — J45909 Unspecified asthma, uncomplicated: Secondary | ICD-10-CM | POA: Insufficient documentation

## 2022-11-19 DIAGNOSIS — S52502A Unspecified fracture of the lower end of left radius, initial encounter for closed fracture: Secondary | ICD-10-CM | POA: Insufficient documentation

## 2022-11-19 DIAGNOSIS — M25532 Pain in left wrist: Secondary | ICD-10-CM | POA: Insufficient documentation

## 2022-11-19 MED ORDER — ACETAMINOPHEN 500 MG PO TABS: 1000.0000 mg | ORAL_TABLET | Freq: Once | ORAL | Status: DC

## 2022-11-19 MED ORDER — LORAZEPAM 1 MG PO TABS
1.0000 mg | ORAL_TABLET | Freq: Once | ORAL | Status: AC
  Administered 2022-11-19: 1 mg via ORAL
  Filled 2022-11-19: qty 1

## 2022-11-19 MED ORDER — OXYCODONE-ACETAMINOPHEN 5-325 MG PO TABS: 1.0000 | ORAL_TABLET | Freq: Once | ORAL | Status: DC

## 2022-11-19 MED ORDER — OXYCODONE-ACETAMINOPHEN 5-325 MG PO TABS
1.0000 | ORAL_TABLET | Freq: Once | ORAL | Status: AC
  Administered 2022-11-19: 1 via ORAL
  Filled 2022-11-19: qty 1

## 2022-11-19 MED ORDER — OXYCODONE-ACETAMINOPHEN 5-325 MG PO TABS
2.0000 | ORAL_TABLET | Freq: Once | ORAL | Status: AC
  Administered 2022-11-19: 2 via ORAL
  Filled 2022-11-19: qty 2

## 2022-11-19 MED ORDER — HALOPERIDOL 5 MG PO TABS
5.0000 mg | ORAL_TABLET | Freq: Once | ORAL | Status: AC
  Administered 2022-11-19: 5 mg via ORAL
  Filled 2022-11-19: qty 1

## 2022-11-19 MED ORDER — HYDROCODONE-ACETAMINOPHEN 5-325 MG PO TABS
1.0000 | ORAL_TABLET | Freq: Once | ORAL | Status: AC
  Administered 2022-11-19: 1 via ORAL
  Filled 2022-11-19: qty 1

## 2022-11-19 MED ORDER — LIDOCAINE-EPINEPHRINE (PF) 2 %-1:200000 IJ SOLN
10.0000 mL | Freq: Once | INTRAMUSCULAR | Status: AC
  Administered 2022-11-19: 10 mL via INTRADERMAL
  Filled 2022-11-19: qty 20

## 2022-11-19 NOTE — ED Notes (Signed)
Spoke with Ortho tech for application of short arm splint.

## 2022-11-19 NOTE — ED Notes (Signed)
Pt asking to leave AMA. Encouraged pt to stay and asked if I could at least message PA to come talk about results. Pt became loud and started yelling at staff again. Signed AMA form and left

## 2022-11-19 NOTE — ED Provider Notes (Signed)
Patient eloped.  Repeat films with not much improvement of reduction.  She was given him follow-up but she did not get her paperwork.  She did not stay for me to reevaluate her after films were done.   Virgina Norfolk, DO 11/19/22 [phone]
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
## 2022-11-19 NOTE — Discharge Instructions (Addendum)
Thank you for allowing [name] to be a part of your care today.  Your arm splint was reapplied and must remain in place until you follow up with Dr. Melvyn Novas.  DO NOT REMOVE for ANY REASON.  If you experience increased pain, cold or blue/purple fingers, return to the ED for evaluation as your splint may be too tight.    Take your pain medicine as prescribed.  You may also take Tylenol every 6 hours for pain and inflammation.  DO NOT EXCEED 4000 mg of Tylenol in a 24-hour period.  Keep in mind your pain medicine also contains Tylenol (acetaminophen).   I have attached helpful information on how to care for your splint.
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
## 2022-11-19 NOTE — ED Notes (Signed)
Pt went to [place].

## 2022-11-19 NOTE — ED Notes (Signed)
Patient transported to X-ray 
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
## 2022-11-19 NOTE — ED Notes (Addendum)
Pt left, told other staff members that she needed to leave.

## 2022-11-19 NOTE — Progress Notes (Signed)
Orthopedic Tech Progress Note Patient Details:  Holly Mendez 02-May-1975 [id number]  Ortho Devices Type of Ortho Device: Sugartong splint Ortho Device/Splint Location: RUE Ortho Device/Splint Interventions: Ordered, Application, Adjustment   Post Interventions Patient Tolerated: Poor Instructions Provided: Care of device Patients first splint applied at [facility] this morning at 0700 following reduction, however patient removed splint as soon as she got home because she states she needed to shower. Confirmed that second reduction was not going to be done before second splint application. Splint applied in best attainable fashion, I had some difficulty due to the patient not tolerating correct placement of her arm to be placed into a sugartong splint. During the splint application the patient kept trying to move her arm even with assistance from RN holding it.  [place] A Dalen [place] 11/19/2022, 12:20 PM

## 2022-11-19 NOTE — ED Notes (Addendum)
Unable to assess patient's vitals due to patient's non-compliance. She refused getting her vitals assessed stating that she does not care about vitals and just needs someone to check her hand
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
## 2022-11-21 ENCOUNTER — Telehealth: Payer: Self-pay
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
## 2022-11-22 ENCOUNTER — Ambulatory Visit (HOSPITAL_COMMUNITY): Payer: MEDICAID

## 2022-11-27 ENCOUNTER — Telehealth: Payer: Self-pay

## 2022-11-27 ENCOUNTER — Other Ambulatory Visit: Payer: Self-pay

## 2022-11-27 MED ORDER — HYDROCODONE-ACETAMINOPHEN 5-325 MG PO TABS: ORAL_TABLET | ORAL | 0 refills | Status: DC

## 2022-11-27 NOTE — Telephone Encounter (Signed)
Patient calls nurse line requesting a refill on Hydrocodone prescription.   She reports she has to cancel her apt with PCP on 11/20. She reports she is having surgery that morning.   She [facility] [address] as she will be in [place] all week for surgery.   Will forward to PCP.   I have pended the preferred pharmacy for patient.

## 2022-11-27 NOTE — Progress Notes (Signed)
PCP - Nestor Ramp, MD  Cardiologist -   PPM/ICD - denies Device Orders - n/a Rep Notified - n/a  Chest x-ray - denies EKG - 07-01-22 Stress Test - denies ECHO - denies Cardiac Cath - denies  CPAP - denies  Dm - Pre DM per patient  Blood Thinner Instructions: denies Aspirin Instructions: n/a  ERAS Protcol - NPO  COVID TEST- n/a  Anesthesia review: yes hx of asthma and htn  Patient verbally denies any shortness of breath, fever, cough and chest pain during phone call   -------------  SDW INSTRUCTIONS given:  Your procedure is scheduled on November 29, 2022.  Report to [facility] Main Entrance "A" at PATIENT TO CALL FOR TIME  A.M., and check in at the Admitting office.  Call this number if you have problems the morning of surgery:  [phone]   Remember:  Do not eat or drink after midnight the night before your surgery     Take these medicines the morning of surgery with A SIP OF WATER  cetirizine (ZYRTEC)  divalproex (DEPAKOTE)  esomeprazole (NEXIUM)  estradiol (ESTRACE)  hydrOXYzine (ATARAX)   IF NEEDED budesonide-formoterol (SYMBICORT)  albuterol (PROAIR HFA)  HYDROcodone-acetaminophen (NORCO/VICODIN)  NARCAN    As of today, STOP taking any Aspirin (unless otherwise instructed by your surgeon) Aleve, Naproxen, Ibuprofen, Motrin, Advil, Goody's, BC's, all herbal medications, fish oil, and all vitamins. THIS INCLUDES  YOUR diclofenac Sodium (VOLTAREN) .                      Do not wear jewelry, make up, or nail polish            Do not wear lotions, powders, perfumes/colognes, or deodorant.            Do not shave 48 hours prior to surgery.  Men may shave face and neck.            Do not bring valuables to the hospital.            [facility] is not responsible for any belongings or valuables.  Do NOT Smoke (Tobacco/Vaping) 24 hours prior to your procedure If you use a CPAP at night, you may bring all equipment for your overnight stay.   Contacts,  glasses, dentures or bridgework may not be worn into surgery.      For patients admitted to the hospital, discharge time will be determined by your treatment team.   Patients discharged the day of surgery will not be allowed to drive home, and someone needs to stay with them for 24 hours.    Special instructions:   [place]- Preparing For Surgery  Before surgery, you can play an important role. Because skin is not sterile, your skin needs to be as free of germs as possible. You can reduce the number of germs on your skin by washing with CHG (chlorahexidine gluconate) Soap before surgery.  CHG is an antiseptic cleaner which kills germs and bonds with the skin to continue killing germs even after washing.    Oral Hygiene is also important to reduce your risk of infection.  Remember - BRUSH YOUR TEETH THE MORNING OF SURGERY WITH YOUR REGULAR TOOTHPASTE  Please do not use if you have an allergy to CHG or antibacterial soaps. If your skin becomes reddened/irritated stop using the CHG.  Do not shave (including legs and underarms) for at least 48 hours prior to first CHG shower. It is OK to shave  your face.  Please follow these instructions carefully.   Shower the NIGHT BEFORE SURGERY and the MORNING OF SURGERY with DIAL Soap.   Pat yourself dry with a CLEAN TOWEL.  Wear CLEAN PAJAMAS to bed the night before surgery  Place CLEAN SHEETS on your bed the night of your first shower and DO NOT SLEEP WITH PETS.   Day of Surgery: Please shower morning of surgery  Wear Clean/Comfortable clothing the morning of surgery Do not apply any deodorants/lotions.   Remember to brush your teeth WITH YOUR REGULAR TOOTHPASTE.   Questions were answered. Patient verbalized understanding of instructions.

## 2022-11-29 ENCOUNTER — Other Ambulatory Visit: Payer: Self-pay

## 2022-11-29 ENCOUNTER — Ambulatory Visit: Payer: MEDICAID | Admitting: Family Medicine

## 2022-11-29 ENCOUNTER — Encounter (HOSPITAL_COMMUNITY): Payer: Self-pay | Admitting: Orthopedic Surgery

## 2022-11-29 ENCOUNTER — Ambulatory Visit (HOSPITAL_COMMUNITY): Payer: MEDICAID | Admitting: Physician Assistant

## 2022-11-29 ENCOUNTER — Encounter (HOSPITAL_COMMUNITY)
Admission: RE | Disposition: A | Discharge status: Home / Self Care | Payer: Self-pay | Source: Home / Self Care | Attending: Orthopedic Surgery

## 2022-11-29 ENCOUNTER — Ambulatory Visit (HOSPITAL_COMMUNITY)
Admission: RE | Admit: 2022-11-29 | Discharge: 2022-11-29 | Disposition: A | Discharge status: Home / Self Care | Payer: MEDICAID | Attending: Orthopedic Surgery | Admitting: Orthopedic Surgery

## 2022-11-29 ENCOUNTER — Ambulatory Visit (HOSPITAL_BASED_OUTPATIENT_CLINIC_OR_DEPARTMENT_OTHER): Payer: MEDICAID | Admitting: Physician Assistant

## 2022-11-29 DIAGNOSIS — H5462 Unqualified visual loss, left eye, normal vision right eye: Secondary | ICD-10-CM | POA: Insufficient documentation

## 2022-11-29 DIAGNOSIS — M797 Fibromyalgia: Secondary | ICD-10-CM | POA: Diagnosis not present

## 2022-11-29 DIAGNOSIS — W19XXXA Unspecified fall, initial encounter: Secondary | ICD-10-CM | POA: Insufficient documentation

## 2022-11-29 DIAGNOSIS — I10 Essential (primary) hypertension: Secondary | ICD-10-CM

## 2022-11-29 DIAGNOSIS — F319 Bipolar disorder, unspecified: Secondary | ICD-10-CM | POA: Diagnosis not present

## 2022-11-29 DIAGNOSIS — S8991XS Unspecified injury of right lower leg, sequela: Secondary | ICD-10-CM

## 2022-11-29 DIAGNOSIS — S52572A Other intraarticular fracture of lower end of left radius, initial encounter for closed fracture: Secondary | ICD-10-CM | POA: Diagnosis present

## 2022-11-29 DIAGNOSIS — K219 Gastro-esophageal reflux disease without esophagitis: Secondary | ICD-10-CM | POA: Diagnosis not present

## 2022-11-29 DIAGNOSIS — S52502A Unspecified fracture of the lower end of left radius, initial encounter for closed fracture: Secondary | ICD-10-CM | POA: Diagnosis not present

## 2022-11-29 HISTORY — PX: ORIF WRIST FRACTURE: SHX2133

## 2022-11-29 LAB — COMPREHENSIVE METABOLIC PANEL
ALT: 33 U/L (ref 0–44)
AST: 34 U/L (ref 15–41)
Albumin: 3.9 g/dL (ref 3.5–5.0)
Alkaline Phosphatase: 72 U/L (ref 38–126)
Anion gap: 9 (ref 5–15)
BUN: 8 mg/dL (ref 6–20)
CO2: 23 mmol/L (ref 22–32)
Calcium: 9.4 mg/dL (ref 8.9–10.3)
Chloride: 104 mmol/L (ref 98–111)
Creatinine, Ser: 0.76 mg/dL (ref 0.44–1.00)
GFR, Estimated: >60 mL/min (ref >60)
Glucose, Bld: 105 mg/dL — ABNORMAL HIGH (ref 70–99)
Potassium: 3.4 mmol/L — ABNORMAL LOW (ref 3.5–5.1)
Sodium: 136 mmol/L (ref 135–145)
Total Bilirubin: 0.4 mg/dL (ref <1.2)
Total Protein: 7.6 g/dL (ref 6.5–8.1)

## 2022-11-29 LAB — CBC
HCT: 34.8 % — ABNORMAL LOW (ref 36.0–46.0)
Hemoglobin: 11.5 g/dL — ABNORMAL LOW (ref 12.0–15.0)
MCH: 28.5 pg (ref 26.0–34.0)
MCHC: 33 g/dL (ref 30.0–36.0)
MCV: 86.1 fL (ref 80.0–100.0)
Platelets: 338 10*3/uL (ref 150–400)
RBC: 4.04 MIL/uL (ref 3.87–5.11)
RDW: 15.1 % (ref 11.5–15.5)
WBC: 5.8 10*3/uL (ref 4.0–10.5)
nRBC: 0 % (ref 0.0–0.2)

## 2022-11-29 LAB — SURGICAL PCR SCREEN
MRSA, PCR: NEGATIVE
Staphylococcus aureus: NEGATIVE

## 2022-11-29 LAB — GLUCOSE, CAPILLARY: Glucose-Capillary: 123 mg/dL — ABNORMAL HIGH (ref 70–99)

## 2022-11-29 SURGERY — OPEN REDUCTION INTERNAL FIXATION (ORIF) WRIST FRACTURE
Anesthesia: Monitor Anesthesia Care | Site: Wrist | Laterality: Left

## 2022-11-29 MED ORDER — FENTANYL CITRATE PF 50 MCG/ML IJ SOSY
50.0000 ug | PREFILLED_SYRINGE | Freq: Once | INTRAMUSCULAR | Status: DC
  Filled 2022-11-29: qty 1

## 2022-11-29 MED ORDER — PROPOFOL 500 MG/50ML IV EMUL
INTRAVENOUS | Status: DC | PRN
Start: 2022-11-29 — End: 2022-11-29
  Administered 2022-11-29: 50 mg via INTRAVENOUS
  Administered 2022-11-29: 150 ug/kg/min via INTRAVENOUS

## 2022-11-29 MED ORDER — MIDAZOLAM HCL 2 MG/2ML IJ SOLN
INTRAMUSCULAR | Status: AC
  Administered 2022-11-29: 2 mg via INTRAVENOUS
  Filled 2022-11-29: qty 2

## 2022-11-29 MED ORDER — FENTANYL CITRATE (PF) 100 MCG/2ML IJ SOLN
INTRAMUSCULAR | Status: AC
  Administered 2022-11-29: 50 ug
  Filled 2022-11-29: qty 2

## 2022-11-29 MED ORDER — FENTANYL CITRATE (PF) 100 MCG/2ML IJ SOLN
25.0000 ug | INTRAMUSCULAR | Status: DC | PRN
  Administered 2022-11-29 (×2): 50 ug via INTRAVENOUS

## 2022-11-29 MED ORDER — MIDAZOLAM HCL 2 MG/2ML IJ SOLN
INTRAMUSCULAR | Status: DC | PRN
  Administered 2022-11-29: 2 mg via INTRAVENOUS

## 2022-11-29 MED ORDER — ORAL CARE MOUTH RINSE: 15.0000 mL | Freq: Once | OROMUCOSAL | Status: AC

## 2022-11-29 MED ORDER — LIDOCAINE HCL (PF) 1.5 % IJ SOLN
INTRAMUSCULAR | Status: DC | PRN
  Administered 2022-11-29: 25 mL via INTRADERMAL

## 2022-11-29 MED ORDER — LIDOCAINE 2% (20 MG/ML) 5 ML SYRINGE
INTRAMUSCULAR | Status: DC | PRN
  Administered 2022-11-29: 50 mg via INTRAVENOUS

## 2022-11-29 MED ORDER — LACTATED RINGERS IV SOLN: INTRAVENOUS | Status: DC

## 2022-11-29 MED ORDER — FENTANYL CITRATE (PF) 250 MCG/5ML IJ SOLN
INTRAMUSCULAR | Status: AC
  Filled 2022-11-29: qty 5

## 2022-11-29 MED ORDER — FENTANYL CITRATE (PF) 250 MCG/5ML IJ SOLN
INTRAMUSCULAR | Status: DC | PRN
  Administered 2022-11-29 (×4): 50 ug via INTRAVENOUS

## 2022-11-29 MED ORDER — PROPOFOL 10 MG/ML IV BOLUS
INTRAVENOUS | Status: DC | PRN
  Administered 2022-11-29: 100 mg via INTRAVENOUS
  Administered 2022-11-29 (×2): 50 mg via INTRAVENOUS

## 2022-11-29 MED ORDER — MIDAZOLAM HCL 2 MG/2ML IJ SOLN
2.0000 mg | Freq: Once | INTRAMUSCULAR | Status: AC
  Filled 2022-11-29: qty 2

## 2022-11-29 MED ORDER — DEXAMETHASONE SODIUM PHOSPHATE 10 MG/ML IJ SOLN
INTRAMUSCULAR | Status: DC | PRN
  Administered 2022-11-29: 10 mg via INTRAVENOUS

## 2022-11-29 MED ORDER — PHENYLEPHRINE HCL-NACL 20-0.9 MG/250ML-% IV SOLN
INTRAVENOUS | Status: DC | PRN
  Administered 2022-11-29: 20 ug/min via INTRAVENOUS
  Administered 2022-11-29: 80 ug via INTRAVENOUS

## 2022-11-29 MED ORDER — ONDANSETRON HCL 4 MG/2ML IJ SOLN
INTRAMUSCULAR | Status: AC
  Filled 2022-11-29: qty 2

## 2022-11-29 MED ORDER — ONDANSETRON HCL 4 MG/2ML IJ SOLN
INTRAMUSCULAR | Status: DC | PRN
  Administered 2022-11-29: 4 mg via INTRAVENOUS

## 2022-11-29 MED ORDER — DROPERIDOL 2.5 MG/ML IJ SOLN: 0.6250 mg | Freq: Once | INTRAMUSCULAR | Status: DC | PRN

## 2022-11-29 MED ORDER — MIDAZOLAM HCL 2 MG/2ML IJ SOLN
INTRAMUSCULAR | Status: AC
  Filled 2022-11-29: qty 2

## 2022-11-29 MED ORDER — ROCURONIUM BROMIDE 10 MG/ML (PF) SYRINGE
PREFILLED_SYRINGE | INTRAVENOUS | Status: AC
  Filled 2022-11-29: qty 10

## 2022-11-29 MED ORDER — PROPOFOL 10 MG/ML IV BOLUS
INTRAVENOUS | Status: AC
  Filled 2022-11-29: qty 20

## 2022-11-29 MED ORDER — FENTANYL CITRATE (PF) 100 MCG/2ML IJ SOLN
INTRAMUSCULAR | Status: AC
  Filled 2022-11-29: qty 2

## 2022-11-29 MED ORDER — DEXAMETHASONE SODIUM PHOSPHATE 10 MG/ML IJ SOLN
INTRAMUSCULAR | Status: AC
  Filled 2022-11-29: qty 1

## 2022-11-29 MED ORDER — OXYCODONE HCL 5 MG/5ML PO SOLN: 5.0000 mg | Freq: Once | ORAL | Status: DC | PRN

## 2022-11-29 MED ORDER — BUPIVACAINE HCL (PF) 0.25 % IJ SOLN
INTRAMUSCULAR | Status: AC
  Filled 2022-11-29: qty 30

## 2022-11-29 MED ORDER — 0.9 % SODIUM CHLORIDE (POUR BTL) OPTIME
TOPICAL | Status: DC | PRN
  Administered 2022-11-29: 1000 mL

## 2022-11-29 MED ORDER — CEFAZOLIN SODIUM-DEXTROSE 2-4 GM/100ML-% IV SOLN
2.0000 g | INTRAVENOUS | Status: AC
  Administered 2022-11-29: 2 g via INTRAVENOUS
  Filled 2022-11-29: qty 100

## 2022-11-29 MED ORDER — OXYCODONE HCL 5 MG PO TABS: 5.0000 mg | ORAL_TABLET | Freq: Once | ORAL | Status: DC | PRN

## 2022-11-29 MED ORDER — ACETAMINOPHEN 10 MG/ML IV SOLN: 1000.0000 mg | Freq: Once | INTRAVENOUS | Status: DC | PRN

## 2022-11-29 MED ORDER — CHLORHEXIDINE GLUCONATE 0.12 % MT SOLN
15.0000 mL | Freq: Once | OROMUCOSAL | Status: AC
  Administered 2022-11-29: 15 mL via OROMUCOSAL
  Filled 2022-11-29: qty 15

## 2022-11-29 SURGICAL SUPPLY — 46 items
BAG COUNTER SPONGE SURGICOUNT (BAG) ×1 IMPLANT
BIT DRILL 2.2 SS TIBIAL (BIT) IMPLANT
BNDG ELASTIC 2 VLCR STRL LF (GAUZE/BANDAGES/DRESSINGS) IMPLANT
BNDG ELASTIC 3INX 5YD STR LF (GAUZE/BANDAGES/DRESSINGS) ×1 IMPLANT
BNDG ELASTIC 4X5.8 VLCR STR LF (GAUZE/BANDAGES/DRESSINGS) ×1 IMPLANT
BNDG ESMARK 4X9 LF (GAUZE/BANDAGES/DRESSINGS) ×1 IMPLANT
BNDG GAUZE DERMACEA FLUFF 4 (GAUZE/BANDAGES/DRESSINGS) ×1 IMPLANT
BNDG STRETCH GAUZE 3IN X12FT (GAUZE/BANDAGES/DRESSINGS) IMPLANT
CORD BIPOLAR FORCEPS 12FT (ELECTRODE) ×1 IMPLANT
COVER SURGICAL LIGHT HANDLE (MISCELLANEOUS) ×1 IMPLANT
CUFF TOURN SGL QUICK 18X4 (TOURNIQUET CUFF) ×1 IMPLANT
DRAPE OEC MINIVIEW 54X84 (DRAPES) ×1 IMPLANT
DRAPE SURG 17X11 SM STRL (DRAPES) ×1 IMPLANT
DRSG ADAPTIC 3X8 NADH LF (GAUZE/BANDAGES/DRESSINGS) ×1 IMPLANT
GAUZE SPONGE 4X4 12PLY STRL (GAUZE/BANDAGES/DRESSINGS) ×1 IMPLANT
GLOVE BIOGEL PI IND STRL 8.5 (GLOVE) ×1 IMPLANT
GLOVE SURG ORTHO 8.0 STRL STRW (GLOVE) ×1 IMPLANT
GOWN STRL REUS W/ TWL LRG LVL3 (GOWN DISPOSABLE) ×3 IMPLANT
GOWN STRL REUS W/ TWL XL LVL3 (GOWN DISPOSABLE) ×1 IMPLANT
K-WIRE FX5X1.6XNS BN SS (WIRE) ×1
KIT BASIN OR (CUSTOM PROCEDURE TRAY) ×1 IMPLANT
KIT TURNOVER KIT B (KITS) ×1 IMPLANT
KWIRE FX5X1.6XNS BN SS (WIRE) IMPLANT
MANIFOLD NEPTUNE II (INSTRUMENTS) ×1 IMPLANT
NDL HYPO 25X1 1.5 SAFETY (NEEDLE) ×1 IMPLANT
NEEDLE HYPO 25X1 1.5 SAFETY (NEEDLE) ×1 IMPLANT
NS IRRIG 1000ML POUR BTL (IV SOLUTION) ×1 IMPLANT
PACK ORTHO EXTREMITY (CUSTOM PROCEDURE TRAY) ×1 IMPLANT
PAD ARMBOARD 7.5X6 YLW CONV (MISCELLANEOUS) ×2 IMPLANT
PAD CAST 4YDX4 CTTN HI CHSV (CAST SUPPLIES) ×1 IMPLANT
PEG LOCKING SMOOTH 2.2X16 (Screw) IMPLANT
PEG LOCKING SMOOTH 2.2X20 (Screw) IMPLANT
PEG LOCKING SMOOTH 2.2X22 (Screw) IMPLANT
PLATE WIDE DVR LEFT (Plate) IMPLANT
SCREW LOCK 14X2.7X 3 LD TPR (Screw) IMPLANT
SCREW LOCK 16X2.7X 3 LD TPR (Screw) IMPLANT
SCREW LOCKING 2.7X13MM (Screw) IMPLANT
SCREW LOCKING 2.7X15MM (Screw) IMPLANT
SCREW MULTI DIRECTIONAL 2.7X16 (Screw) IMPLANT
SCREW MULTI DIRECTIONAL 2.7X18 (Screw) IMPLANT
SCREW MULTI DIRECTIONAL 2.7X20 (Screw) IMPLANT
SOAP 2 % CHG 4 OZ (WOUND CARE) ×1 IMPLANT
TOWEL GREEN STERILE (TOWEL DISPOSABLE) ×1 IMPLANT
TOWEL GREEN STERILE FF (TOWEL DISPOSABLE) ×1 IMPLANT
TUBE CONNECTING 12X1/4 (SUCTIONS) ×1 IMPLANT
WATER STERILE IRR 1000ML POUR (IV SOLUTION) ×1 IMPLANT

## 2022-11-29 NOTE — Anesthesia Preprocedure Evaluation (Addendum)
Anesthesia Evaluation  Patient identified by MRN, date of birth, ID band Patient awake    Reviewed: Allergy & Precautions, NPO status , Patient's Chart, lab work & pertinent test results  History of Anesthesia Complications Negative for: history of anesthetic complications  Airway Mallampati: II  TM Distance: >3 FB Neck ROM: Full    Dental  (+) Teeth Intact   Pulmonary asthma , Patient abstained from smoking., former smoker   Pulmonary exam normal        Cardiovascular hypertension, Normal cardiovascular exam     Neuro/Psych  PSYCHIATRIC DISORDERS Anxiety Depression Bipolar Disorder Schizophrenia   Neuromuscular disease    GI/Hepatic Neg liver ROS,GERD  ,,  Endo/Other    Class 3 obesity  Renal/GU negative Renal ROS  negative genitourinary   Musculoskeletal  (+) Arthritis ,  Fibromyalgia -  Abdominal   Peds  Hematology negative hematology ROS (+)   Anesthesia Other Findings   Reproductive/Obstetrics                             Anesthesia Physical Anesthesia Plan  ASA: 3  Anesthesia Plan: Regional and MAC   Post-op Pain Management: Regional block*   Induction: Intravenous  PONV Risk Score and Plan: 3 and Ondansetron, Treatment may vary due to age or medical condition and TIVA  Airway Management Planned: Natural Airway, Nasal Cannula and Simple Face Mask  Additional Equipment: None  Intra-op Plan:   Post-operative Plan: Extubation in OR  Informed Consent: I have reviewed the patients History and Physical, chart, labs and discussed the procedure including the risks, benefits and alternatives for the proposed anesthesia with the patient or authorized representative who has indicated his/her understanding and acceptance.     Dental advisory given  Plan Discussed with:   Anesthesia Plan Comments:         Anesthesia Quick Evaluation
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
## 2022-11-29 NOTE — Anesthesia Postprocedure Evaluation (Signed)
Anesthesia Post Note  Patient: Holly Mendez  Procedure(s) Performed: OPEN REDUCTION INTERNAL FIXATION (ORIF) WRIST FRACTURE (Left: Wrist)     Patient location during evaluation: PACU Anesthesia Type: Regional and MAC Level of consciousness: awake and alert Pain management: pain level controlled Vital Signs Assessment: post-procedure vital signs reviewed and stable Respiratory status: spontaneous breathing, nonlabored ventilation, respiratory function stable and patient connected to nasal cannula oxygen Cardiovascular status: stable and blood pressure returned to baseline Postop Assessment: no apparent nausea or vomiting Anesthetic complications: no  No notable events documented.  Last Vitals:  Vitals:   11/29/22 1945 11/29/22 2000  BP: (!) 129/93 125/83  Pulse: 93 95  Resp: 19 17  Temp:  36.7 C  SpO2: 98% 98%    Last Pain:  Vitals:   11/29/22 2000  TempSrc:   PainSc: 3                  Correll Denbow S

## 2022-11-29 NOTE — Op Note (Signed)
PREOPERATIVE DIAGNOSIS: Left wrist intra-articular distal radius fracture 3 more fragments  POSTOPERATIVE DIAGNOSIS: Same  ATTENDING SURGEON: Bradly Bienenstock who scrubbed and present for the entire procedure  ASSISTANT SURGEON: None  ANESTHESIA: Regional with IV sedation  OPERATIVE PROCEDURE: Open treatment of left wrist intra-articular distal radius fracture 3 more fragments Left wrist brachial radialis tendon release, tendon tenotomy Radiographs 3 views left wrist  IMPLANTS: Biomet DVR cross lock wide plate  EBL: Minimal  RADIOGRAPHIC INTERPRETATION: AP and lateral and oblique radiographs of the wrist do show the volar plate fixation in place with good alignment of the radiocarpal midcarpal and distal radial joint.  SURGICAL INDICATIONS: Patient is a right-hand-dominant female who sustained a fall and landed on outstretched left wrist.  Patient seen evaluate in the office and recommend undergo the above procedure.  The risks of surgery include but not limited to bleeding infection damage nearby nerves arteries or tendons loss of motion of the wrist and digits incomplete relief of symptoms and need for further surgical invention.  SURGICAL TECHNIQUE: The patient was prepped identified in the preoperative holding area marked apart a marker made on the left wrist indicate the correct operative site.  The patient was then brought back the operating placed supine on the anesthesia table where the regional anesthetic was administered.  Patient tolerated this well.  A well-padded tourniquet was then placed on the left brachium and sealed with the appropriate drape.  The left upper extremities then prepped and draped normal sterile fashion.  A timeout was called the correct site was identified the procedure then began.  Attention then turned to the left wrist preoperative antibiotics were given prior to skin incision.  The tourniquet insufflated.  A longitudinal incision made directly over the FCR  sheath.  Dissection carried down through the skin and subcutaneous tissue.  The FCR sheath was then released proximally and distally.  Going through the floor the FCR sheath the FPL was then carefully identified and protected.  The pronator quadratus was then exposed and the pronator quadratus was then elevated in an L-shaped fashion.  In order to reduce the radial column and the comminuted intra-articular fragment both dorsally and along the radial column the brachial radialis was then carefully identified.  Tendon tenotomy was then carried out and the brachial radialis was then released off the radial styloid carefully protecting the first dorsal compartments.  This was a separate intervention.  Following this open reduction was then performed of the intra-articular distal radius fracture.  This was a fragment of more than 3 fragments intra-articular.  Following this the volar plate was then applied and held distally with a K wire.  Plate position was then confirmed using the mini C arm.  Given the very far distal nature of the plate multidirectional screws were then placed in the proximal and distal rows.  These were then confirmed using the mini C arm.  Following this screw fixation was then achieved proximally with a combination of locking nonlocking screws.  Final radiographs were then obtained.  Following this the pronator quadratus was closed with 2-0 Vicryl subcutaneous tissues closed with 3-0 Monocryl and the skin closed with simple Prolene 3-0 sutures.  Adaptic dressing sterile compressive bandage then applied.  The patient was then placed in a well-padded sugar-tong splint taken recovery room in good condition.  POSTOPERATIVE PLAN: Patient be discharged home.  See hER back in the office in approximately 2 weeks for wound check suture removal x-rays application of short arm cast.  Placed  the therapy order begin at the 4-week mark.  Radiographs at each visit.

## 2022-11-29 NOTE — Anesthesia Procedure Notes (Signed)
Anesthesia Regional Block: Supraclavicular block   Pre-Anesthetic Checklist: , timeout performed,  Correct Patient, Correct Site, Correct Laterality,  Correct Procedure, Correct Position, site marked,  Risks and benefits discussed,  Surgical consent,  Pre-op evaluation,  At surgeon's request and post-op pain management  Laterality: Left  Prep: chloraprep       Needles:  Injection technique: Single-shot  Needle Type: Echogenic Stimulator Needle     Needle Length: 9cm  Needle Gauge: 21     Additional Needles:   Procedures:,,,, ultrasound used (permanent image in chart),,    Narrative:  Start time: 11/29/2022 5:05 PM End time: 11/29/2022 5:10 PM Injection made incrementally with aspirations every 5 mL.  Performed by: Personally  Anesthesiologist: Lomas Nation, MD  Additional Notes: Discussed risks and benefits of the nerve block in detail, including but not limited vascular injury, permanent nerve damage and infection.   Patient tolerated the procedure well. Local anesthetic introduced in an incremental fashion under minimal resistance after negative aspirations. No paresthesias were elicited. After completion of the procedure, no acute issues were identified and patient continued to be monitored by RN.

## 2022-11-29 NOTE — Transfer of Care (Signed)
Immediate Anesthesia Transfer of Care Note  Patient: Holly Mendez  Procedure(s) Performed: OPEN REDUCTION INTERNAL FIXATION (ORIF) WRIST FRACTURE (Left: Wrist)  Patient Location: PACU  Anesthesia Type:MAC  Level of Consciousness: awake, alert , and oriented  Airway & Oxygen Therapy: Patient Spontanous Breathing  Post-op Assessment: Report given to RN and Post -op Vital signs reviewed and stable  Post vital signs: Reviewed and stable  Last Vitals:  Vitals Value Taken Time  BP 126/97 11/29/22 1930  Temp    Pulse 95 11/29/22 1941  Resp 21 11/29/22 1941  SpO2 95 % 11/29/22 1941  Vitals shown include unfiled device data.  Last Pain:  Vitals:   11/29/22 1602  TempSrc: Oral  PainSc: 7          Complications: No notable events documented.

## 2022-11-29 NOTE — Discharge Instructions (Addendum)
KEEP BANDAGE CLEAN AND DRY CALL OFFICE FOR F/U APPT [phone] in 2 weeks KEEP HAND ELEVATED ABOVE HEART Rx sent to pharmacy, dilaudid for breakthrough pain OK TO APPLY ICE TO OPERATIVE AREA CONTACT OFFICE IF ANY WORSENING PAIN OR CONCERNS.

## 2022-11-30 ENCOUNTER — Telehealth: Payer: Self-pay

## 2022-11-30 NOTE — Telephone Encounter (Signed)
Patient calls nurse line requesting medication for gout.   She reports that she has seen PT that mentioned that her pain in knees and feet may be related to gout. Patient also reports cramping and swelling in feet, ankles and toes.   Patient does not have history with gout. Will forward to PCP for further advisement.   She also reports that her surgery was successful.   [name], RN

## 2022-12-04 NOTE — Telephone Encounter (Signed)
Patient returns call to nurse line regarding previous message.   Please advise.  [name], RN

## 2022-12-05 ENCOUNTER — Other Ambulatory Visit: Payer: Self-pay

## 2022-12-05 NOTE — Telephone Encounter (Signed)
Called patient and provided with update per PCP.   Holly Mendez states that Holly Mendez will follow up with concern with Dr. Jennette Kettle on 12/27/22.  No other questions at this time.   [name], RN

## 2022-12-06 ENCOUNTER — Encounter (HOSPITAL_COMMUNITY): Payer: Self-pay | Admitting: Orthopedic Surgery

## 2022-12-06 MED ORDER — ALBUTEROL SULFATE HFA 108 (90 BASE) MCG/ACT IN AERS: INHALATION_SPRAY | RESPIRATORY_TRACT | 12 refills | Status: DC

## 2022-12-14 ENCOUNTER — Other Ambulatory Visit: Payer: Self-pay | Admitting: Family Medicine

## 2022-12-15 MED ORDER — CETIRIZINE HCL 10 MG PO TABS: 10.0000 mg | ORAL_TABLET | Freq: Every day | ORAL | 0 refills | Status: DC

## 2022-12-15 MED ORDER — BUDESONIDE-FORMOTEROL FUMARATE 80-4.5 MCG/ACT IN AERO: INHALATION_SPRAY | RESPIRATORY_TRACT | 12 refills | Status: DC

## 2022-12-15 NOTE — Addendum Note (Signed)
Addended by: Burley Saver E on: 12/15/2022 01:49 PM   Modules accepted: Orders

## 2022-12-25 ENCOUNTER — Other Ambulatory Visit: Payer: Self-pay

## 2022-12-26 ENCOUNTER — Other Ambulatory Visit: Payer: Self-pay | Admitting: Family Medicine

## 2022-12-26 MED ORDER — HYDROCODONE-ACETAMINOPHEN 5-325 MG PO TABS: ORAL_TABLET | ORAL | 0 refills | Status: DC

## 2022-12-27 ENCOUNTER — Telehealth: Payer: Self-pay

## 2022-12-27 ENCOUNTER — Encounter: Payer: Self-pay | Admitting: Family Medicine

## 2022-12-27 ENCOUNTER — Ambulatory Visit: Payer: MEDICAID | Admitting: Family Medicine

## 2022-12-27 VITALS — BP 131/86 | HR 84 | Ht 65.0 in | Wt 247.0 lb

## 2022-12-27 DIAGNOSIS — N898 Other specified noninflammatory disorders of vagina: Secondary | ICD-10-CM

## 2022-12-27 DIAGNOSIS — N644 Mastodynia: Secondary | ICD-10-CM

## 2022-12-27 DIAGNOSIS — F3170 Bipolar disorder, currently in remission, most recent episode unspecified: Secondary | ICD-10-CM | POA: Diagnosis not present

## 2022-12-27 MED ORDER — FLUCONAZOLE 200 MG PO TABS: 200.0000 mg | ORAL_TABLET | Freq: Every day | ORAL | 0 refills | Status: DC

## 2022-12-27 NOTE — Patient Instructions (Addendum)
I have sent on a week of the diflucan.  I have also sent an order for for a breast ultrasound. Let me know if the discharge does not go away. I would plan on seeing you back in late January.

## 2022-12-27 NOTE — Telephone Encounter (Signed)
Patient calls nurse line regarding prescription for diflucan.   She reports that medication was supposed to be sent to [facility].   Called and canceled at [facility]. Resent to [facility], per patient request.   [name], RN

## 2022-12-28 ENCOUNTER — Telehealth: Payer: Self-pay

## 2022-12-28 DIAGNOSIS — N644 Mastodynia: Secondary | ICD-10-CM

## 2022-12-28 NOTE — Telephone Encounter (Signed)
I THINK I put it in correctly. I ordered unilateral diagnostic right breast mammogram and ultrasound for dx of breast pain.You should double check Please let her know THANKS! Denny Levy

## 2022-12-28 NOTE — Telephone Encounter (Signed)
Patient calls nurse line requesting that orders for breast ultrasound and mammogram to be sent to [facility] in [place].   She reports that [facility] location is booked until February. She reports that [facility] location could schedule her for a sooner appointment.   Will forward to provider to update order location.   *Unsure if patient is supposed to be receiving both ultrasound and mammogram. It appears that last mammogram order placed was in October. Dr. Jennette Kettle- would order need to be updated to diagnostic mammogram or would screening mammogram still be okay?  Thanks.   [name], RN

## 2022-12-29 NOTE — Progress Notes (Signed)
    CHIEF COMPLAINT / HPI: Having recurrent vaginal yeast infection knows it is this because she has had it so many times before.  Says it is embarrassing. #2.  Right sided breast pain.  Intermittent and sharp.  Think she needs a mammogram for this.  Has not felt a lump.  Pain has been there off and on for 4 to 6 weeks.  No redness  PERTINENT  PMH / PSH: I have reviewed the patient's medications, allergies, past medical and surgical history, smoking status and updated in the EMR as appropriate.   OBJECTIVE:  BP 131/86   Pulse 84   Ht 5\' 5"  (1.651 m)   Wt 247 lb (112 kg)   LMP 08/31/2018   SpO2 100%   BMI 41.10 kg/m  GENERAL: Well-developed no acute distress PSYCH: AxOx4. Good eye contact.. No psychomotor retardation or agitation. Appropriate speech fluency and content. Asks and answers questions appropriately. Mood is congruent.    ASSESSMENT / PLAN: Recurrent yeast infections.  Will try longer regimen of fluconazole and then she will let me know via phone or MyChart whether or not symptoms have resolved.  If they have not, we will have her come back in recheck. #2.  Right breast pain: Will order diagnostic mammogram and ultrasound and follow-up after I get those back. No problem-specific Assessment & Plan notes found for this encounter.   Denny Levy MD

## 2023-01-01 NOTE — Telephone Encounter (Signed)
Patient calls nurse line in regards to Mammogram.   She reports they received the right order for the right breast, however she reports they are needing PCP signature for left breast.   She reports she has not had her yearly Mammogram this year, therefore they will need to look at the left breast as well.   She reports they are faxing over the order for PCP to sign off on left breast.   Will forward to PCP.

## 2023-01-04 ENCOUNTER — Emergency Department (HOSPITAL_COMMUNITY): Payer: MEDICAID

## 2023-01-04 ENCOUNTER — Emergency Department (HOSPITAL_COMMUNITY)
Admission: EM | Admit: 2023-01-04 | Discharge: 2023-01-04 | Disposition: A | Discharge status: Home / Self Care | Payer: MEDICAID | Attending: Emergency Medicine | Admitting: Emergency Medicine

## 2023-01-04 ENCOUNTER — Other Ambulatory Visit: Payer: Self-pay

## 2023-01-04 ENCOUNTER — Encounter (HOSPITAL_COMMUNITY): Payer: Self-pay

## 2023-01-04 DIAGNOSIS — R112 Nausea with vomiting, unspecified: Secondary | ICD-10-CM | POA: Diagnosis present

## 2023-01-04 DIAGNOSIS — R1084 Generalized abdominal pain: Secondary | ICD-10-CM | POA: Insufficient documentation

## 2023-01-04 LAB — COMPREHENSIVE METABOLIC PANEL
ALT: 26 U/L (ref 0–44)
AST: 23 U/L (ref 15–41)
Albumin: 4.3 g/dL (ref 3.5–5.0)
Alkaline Phosphatase: 65 U/L (ref 38–126)
Anion gap: 13 (ref 5–15)
BUN: 14 mg/dL (ref 6–20)
CO2: 21 mmol/L — ABNORMAL LOW (ref 22–32)
Calcium: 9.6 mg/dL (ref 8.9–10.3)
Chloride: 106 mmol/L (ref 98–111)
Creatinine, Ser: 0.73 mg/dL (ref 0.44–1.00)
GFR, Estimated: >60 mL/min (ref >60)
Glucose, Bld: 192 mg/dL — ABNORMAL HIGH (ref 70–99)
Potassium: 3.8 mmol/L (ref 3.5–5.1)
Sodium: 140 mmol/L (ref 135–145)
Total Bilirubin: 0.5 mg/dL (ref <1.2)
Total Protein: 8.5 g/dL — ABNORMAL HIGH (ref 6.5–8.1)

## 2023-01-04 LAB — CBC
HCT: 36.9 % (ref 36.0–46.0)
Hemoglobin: 12.6 g/dL (ref 12.0–15.0)
MCH: 29.3 pg (ref 26.0–34.0)
MCHC: 34.1 g/dL (ref 30.0–36.0)
MCV: 85.8 fL (ref 80.0–100.0)
Platelets: 281 10*3/uL (ref 150–400)
RBC: 4.3 MIL/uL (ref 3.87–5.11)
RDW: 14.6 % (ref 11.5–15.5)
WBC: 8.2 10*3/uL (ref 4.0–10.5)
nRBC: 0 % (ref 0.0–0.2)

## 2023-01-04 LAB — LIPASE, BLOOD: Lipase: 32 U/L (ref 11–51)

## 2023-01-04 MED ORDER — MORPHINE SULFATE (PF) 4 MG/ML IV SOLN
4.0000 mg | Freq: Once | INTRAVENOUS | Status: AC
Start: 2023-01-04 — End: 2023-01-04
  Administered 2023-01-04: 4 mg via INTRAVENOUS
  Filled 2023-01-04: qty 1

## 2023-01-04 MED ORDER — ONDANSETRON HCL 4 MG PO TABS: 4.0000 mg | ORAL_TABLET | Freq: Three times a day (TID) | ORAL | 0 refills | Status: DC | PRN

## 2023-01-04 MED ORDER — ONDANSETRON HCL 4 MG PO TABS: 4.0000 mg | ORAL_TABLET | Freq: Three times a day (TID) | ORAL | Status: DC | PRN

## 2023-01-04 MED ORDER — IOHEXOL 300 MG/ML  SOLN
100.0000 mL | Freq: Once | INTRAMUSCULAR | Status: AC | PRN
  Administered 2023-01-04: 100 mL via INTRAVENOUS

## 2023-01-04 MED ORDER — ONDANSETRON HCL 4 MG/2ML IJ SOLN
4.0000 mg | Freq: Once | INTRAMUSCULAR | Status: AC
  Administered 2023-01-04: 4 mg via INTRAVENOUS
  Filled 2023-01-04: qty 2

## 2023-01-04 MED ORDER — PROCHLORPERAZINE EDISYLATE 10 MG/2ML IJ SOLN
10.0000 mg | Freq: Once | INTRAMUSCULAR | Status: AC
  Administered 2023-01-04: 10 mg via INTRAVENOUS
  Filled 2023-01-04: qty 2

## 2023-01-04 NOTE — ED Provider Notes (Signed)
[facility] AT [facility] Provider Note   CSN: [id number] Arrival date & time: 01/04/23  1938     History  Chief Complaint  Patient presents with   Vomiting    Holly Mendez is a 47 y.o. female presents today for nausea, vomiting, abdominal pain x 2 days.  Patient states she has not been able to keep anything down and she is "dehydrated ".  Patient has a history of pancreatitis and thinks that this may be what she has right now.  Patient denies diarrhea, urinary symptoms, shortness of breath, fever, chills, or chest pain.  She also notes bright red blood in her vomit.  HPI     Home Medications Prior to Admission medications   Medication Sig Start Date End Date Taking? Authorizing Provider  ondansetron (ZOFRAN) 4 MG tablet Take 1 tablet (4 mg total) by mouth every 8 (eight) hours as needed for nausea or vomiting. 01/04/23  Yes Dolphus Jenny, PA-C  cetirizine (ZYRTEC) 10 MG tablet Take 1 tablet (10 mg total) by mouth daily. 12/15/22 12/15/23  Billey Co, MD  cloNIDine (CATAPRES - DOSED IN MG/24 HR) 0.1 mg/24hr patch PLACE 1 PATCH ONTO SKIN ONCE WEEKLY. 06/07/22   Nestor Ramp, MD  diclofenac Sodium (VOLTAREN) 1 % GEL Apply 2 g topically 4 (four) times daily. For left knee. Patient taking differently: Apply 2 g topically 4 (four) times daily as needed (pain). 08/12/21   Moses Manners, MD  divalproex (DEPAKOTE) 500 MG DR tablet Take 500 mg by mouth 3 (three) times daily. 10/05/22   [provider]  esomeprazole (NEXIUM) 40 MG capsule Take 1 capsule (40 mg total) by mouth daily. 06/07/22   Nestor Ramp, MD  estradiol (ESTRACE) 1 MG tablet Take 1 tablet (1 mg total) by mouth daily. 12/28/21   Nestor Ramp, MD  fluconazole (DIFLUCAN) 200 MG tablet Take 1 tablet (200 mg total) by mouth daily. 12/27/22   Nestor Ramp, MD  HYDROcodone-acetaminophen (NORCO/VICODIN) 5-325 MG tablet TAKE TWO TABLETS BY MOUTH EVERY MORNING, 1 TAB MIDDAY AND 1 TAB  AT NIGHT 12/26/22   Nestor Ramp, MD  hydrOXYzine (ATARAX) 25 MG tablet Take 25 mg by mouth 3 (three) times daily. 10/05/22   [provider]  NARCAN 4 MG/0.1ML LIQD nasal spray kit Place 1 spray into the nose daily as needed (overdose). 02/15/22   Nestor Ramp, MD  polyethylene glycol powder Joylene John) 17 GM/SCOOP powder Take one or two capfuls daily by mouth as directed for constipation Patient taking differently: Take 17 g by mouth daily as needed for moderate constipation. 10/25/21   Nestor Ramp, MD  QUEtiapine (SEROQUEL) 400 MG tablet Take 200 mg by mouth at bedtime.    [provider]  SYMBICORT 80-4.5 MCG/ACT inhaler INHALE 2 PUFFS INTO THE LUNGS TWICE DAILY AS NEEDED. 12/27/22   Nestor Ramp, MD  topiramate (TOPAMAX) 100 MG tablet Take 100 mg by mouth 2 (two) times daily.    [provider]  VENTOLIN HFA 108 (90 Base) MCG/ACT inhaler INHALE TWO PUFFS INTO THE LUNGS EVERY 6 HOURS AS NEEDED WHEEZING OR SHORTNESS OF BREATH 12/27/22   Nestor Ramp, MD      Allergies    Lyrica [pregabalin], Celebrex [celecoxib], Gabapentin, Naproxen, Norvasc [amlodipine besylate], and Zithromax [azithromycin]    Review of Systems   Review of Systems  Gastrointestinal:  Positive for abdominal pain, nausea and vomiting.    Physical Exam  Updated Vital Signs BP (!) 143/94 (BP Location: Right Arm)   Pulse 89   Temp 98.3 F (36.8 C)   Resp 18   Ht 5\' 5"  (1.651 m)   Wt 112 kg   LMP 08/31/2018   SpO2 96%   BMI 41.10 kg/m  Physical Exam Vitals and nursing note reviewed.  Constitutional:      General: She is not in acute distress.    Appearance: She is well-developed. She is obese.  HENT:     Head: Normocephalic and atraumatic.     Right Ear: External ear normal.     Left Ear: External ear normal.     Nose: Nose normal.  Eyes:     Extraocular Movements: Extraocular movements intact.     Conjunctiva/sclera: Conjunctivae normal.  Cardiovascular:     Rate and Rhythm: Normal  rate and regular rhythm.     Pulses: Normal pulses.     Heart sounds: Normal heart sounds. No murmur heard. Pulmonary:     Effort: Pulmonary effort is normal. No respiratory distress.     Breath sounds: Normal breath sounds. No wheezing.  Abdominal:     General: Abdomen is protuberant. Bowel sounds are normal.     Palpations: Abdomen is soft.     Tenderness: There is generalized abdominal tenderness.  Musculoskeletal:        General: No swelling.     Cervical back: Normal range of motion and neck supple.  Skin:    General: Skin is warm and dry.     Capillary Refill: Capillary refill takes less than 2 seconds.  Neurological:     General: No focal deficit present.     Mental Status: She is alert.     Motor: No weakness.  Psychiatric:        Mood and Affect: Mood normal.     ED Results / Procedures / Treatments   Labs (all labs ordered are listed, but only abnormal results are displayed) Labs Reviewed  COMPREHENSIVE METABOLIC PANEL - Abnormal; Notable for the following components:      Result Value   CO2 21 (*)    Glucose, Bld 192 (*)    Total Protein 8.5 (*)    All other components within normal limits  LIPASE, BLOOD  CBC  URINALYSIS, ROUTINE W REFLEX MICROSCOPIC    EKG None  Radiology CT ABDOMEN PELVIS W CONTRAST Result Date: 01/04/2023 CLINICAL DATA:  Abdominal pain, acute, nonlocalized. Pt reports nausea and vomiting x 2 days with a hx of pancreatitis. EXAM: CT ABDOMEN AND PELVIS WITH CONTRAST TECHNIQUE: Multidetector CT imaging of the abdomen and pelvis was performed using the standard protocol following bolus administration of intravenous contrast. RADIATION DOSE REDUCTION: This exam was performed according to the departmental dose-optimization program which includes automated exposure control, adjustment of the mA and/or kV according to patient size and/or use of iterative reconstruction technique. CONTRAST:  OMNIPAQUE IOHEXOL 300 MG/ML  SOLN COMPARISON:  CT  abdomen pelvis 12/01/2019. FINDINGS: Lower chest: Small hiatal hernia. Bilateral lower lobe atelectasis. No acute abnormality. Hepatobiliary: No focal liver abnormality. No gallstones, gallbladder wall thickening, or pericholecystic fluid. No biliary dilatation. Pancreas: No focal lesion. Normal pancreatic contour. No surrounding inflammatory changes. No main pancreatic ductal dilatation. Spleen: Normal in size without focal abnormality.  Splenule noted. Adrenals/Urinary Tract: No adrenal nodule bilaterally. Bilateral kidneys enhance symmetrically. No hydronephrosis. No hydroureter. The urinary bladder is unremarkable. Stomach/Bowel: Stomach is within normal limits. No evidence of bowel wall thickening or dilatation. Appendix  appears normal. Vascular/Lymphatic: No abdominal aorta or iliac aneurysm. Mild atherosclerotic plaque of the aorta and its branches. No abdominal, pelvic, or inguinal lymphadenopathy. Reproductive: Status post hysterectomy. No adnexal masses. Other: No intraperitoneal free fluid. No intraperitoneal free gas. No organized fluid collection. Musculoskeletal: No abdominal wall hernia or abnormality. No suspicious lytic or blastic osseous lesions. No acute displaced fracture. Chronic densely sclerotic lesion of the T12 vertebral body likely a bone island. IMPRESSION: 1. Small hiatal hernia. 2. No acute intra-abdominal or intrapelvic abnormality. Electronically Signed   By: Tish Frederickson M.D.   On: 01/04/2023 22:01    Procedures Procedures    Medications Ordered in ED Medications  ondansetron (ZOFRAN) injection 4 mg (4 mg Intravenous Given 01/04/23 2106)  iohexol (OMNIPAQUE) 300 MG/ML solution 100 mL (100 mLs Intravenous Contrast Given 01/04/23 2129)  morphine (PF) 4 MG/ML injection 4 mg (4 mg Intravenous Given 01/04/23 2203)  prochlorperazine (COMPAZINE) injection 10 mg (10 mg Intravenous Given 01/04/23 2203)    ED Course/ Medical Decision Making/ A&P                                  Medical Decision Making Amount and/or Complexity of Data Reviewed Labs: ordered. Radiology: ordered.  Risk Prescription drug management.   This patient presents to the ED with chief complaint(s) of nausea and vomiting with pertinent past medical history of pancreatitis which further complicates the presenting complaint. The complaint involves an extensive differential diagnosis and also carries with it a high risk of complications and morbidity.    The differential diagnosis includes pancreatitis, GI illness  Additional history obtained: Records reviewed previous admission documents  ED Course and Reassessment: Patient able to tolerate oral intake prior to discharge.  Independent labs interpretation:  The following labs were independently interpreted:  CBC: No notable findings CMP: Mildly decreased CO2 at 21, elevated total protein at 8.5 Lipase: 32  Independent visualization of imaging: - I independently visualized the following imaging with scope of interpretation limited to determining acute life threatening conditions related to emergency care: CT abdomen pelvis with contrast, which revealed small hiatal hernia, no acute intra-abdominal or intrapelvic abnormality  Consultation: - Consulted or discussed management/test interpretation w/ external professional: None  Consideration for admission or further workup: Considered for admission or further workup however patient's physical exam, vital signs, labs, and imaging were reassuring.  Patient was able to tolerate oral intake without issue prior to discharge.  Patient given short course of Zofran outpatient for nausea and vomiting.        Final Clinical Impression(s) / ED Diagnoses Final diagnoses:  Nausea and vomiting, unspecified vomiting type  Generalized abdominal pain    Rx / DC Orders ED Discharge Orders          Ordered    ondansetron (ZOFRAN) 4 MG tablet  Every 8 hours PRN        01/04/23 2252               Dolphus Jenny, PA-C 01/04/23 2254    Terrilee Files, MD 01/05/23 1050

## 2023-01-04 NOTE — ED Triage Notes (Signed)
Pt reports nausea and vomiting x 2 days with a hx of pancreatitis.

## 2023-01-04 NOTE — Discharge Instructions (Addendum)
Today you were seen for abdominal pain with nausea and vomiting.  Please take your Zofran every 8 hours as needed for nausea and vomiting.  You may alternate taking Tylenol and Motrin as needed for pain.  Please follow-up with your PCP if your symptoms persist for further evaluation and workup.  Thank you for letting us treat you today. After reviewing your labs and imaging, I feel you are safe to go home. Please follow up with your PCP in the next several days and provide them with your records from this visit. Return to the Emergency Room if pain becomes severe or symptoms worsen.

## 2023-01-05 ENCOUNTER — Other Ambulatory Visit: Payer: Self-pay

## 2023-01-05 LAB — I-STAT CHEM 8, ED
BUN: 8 mg/dL (ref 6–20)
Calcium, Ion: 1.21 mmol/L (ref 1.15–1.40)
Chloride: 105 mmol/L (ref 98–111)
Creatinine, Ser: 0.8 mg/dL (ref 0.44–1.00)
Glucose, Bld: 111 mg/dL — ABNORMAL HIGH (ref 70–99)
HCT: 39 % (ref 36.0–46.0)
Hemoglobin: 13.3 g/dL (ref 12.0–15.0)
Potassium: 3.6 mmol/L (ref 3.5–5.1)
Sodium: 140 mmol/L (ref 135–145)
TCO2: 22 mmol/L (ref 22–32)

## 2023-01-05 MED FILL — Ondansetron HCl Tab 4 MG: ORAL | Qty: 4 | Status: AC

## 2023-01-06 ENCOUNTER — Telehealth: Payer: Self-pay | Admitting: Family Medicine

## 2023-01-06 DIAGNOSIS — R112 Nausea with vomiting, unspecified: Secondary | ICD-10-CM

## 2023-01-06 MED ORDER — ONDANSETRON 4 MG PO TBDP: 4.0000 mg | ORAL_TABLET | Freq: Three times a day (TID) | ORAL | 0 refills | Status: DC | PRN

## 2023-01-06 NOTE — Telephone Encounter (Signed)
**  After Hours/ Emergency Line Call**  Received a page to call [phone]) - [phone].  Patient: Holly Mendez  Caller: Self  Confirmed name & DOB of patient with caller  Subjective:  -sick since Wednesday 12/25 -Nausea, NBNB vomiting (multiple episodes, unable to keep anything down), generalized intermittent abdominal pain -subjective fevers/hot flashes -denies CP, SOB -no hematuria/hematochezia. No diarrhea -Has not been able to keep down any fluids or solids since Wednesday Larey Seat in her house today - did not hit her head or pass out but felt very weak  -Visited the ED 12/26 - at that time CMP, CBC, and lipase were normal; CT abd/pelvis showed no acute abnormality. However, since leaving the hospital she has been unable to keep down anything. She was sent home with Zofran but this was the regular tablet and not the disintegrating tab, so she was unable to take it.   -does not want to go back to hospital because she feels like she was not treated well at her recent visit   Objective:  Observations: dry heaving, sounded very uncomfortable, but able to speak in full sentences and was coherent   Assessment & Plan  Holly Mendez is a 47 y.o. female with PMHx s/f chronic pancreatitis, GERD, fibroids who calls with the following complaints and concerns:  -Nausea and vomiting with generalized abdominal pain  Upon review of notes from recent ED visit and recent PCP visit, unable to assess the current etiology of her symptoms via phone. However, she notably has not been able to keep down any fluids or solids for 2 days, which raises concern for dehydration and given the extent of her vomiting I wonder if this has resulted in an AKI or other electrolyte abnormalities. I am reassured by her normal CT and lipase on 12/26 but her symptoms have worsened/not improved since then. Additionally with reported fall I am concerned about her general well being and ability to care for herself in her  current state.  I recommended that the patient be evaluated as soon as possible - advised to visit ED or call EMS if unable to drive herself.  I am also concerned by her dry heaving and discomfort during her phone call. I did offer to call EMS on her behalf but she declined.   Recommendations:  Recommend ED evaluation - advised patient to call EMS given her acute discomfort. She agrees with plan Made appointment in our clinic on Monday morning with Dr. Melissa Noon - whether or not she goes to the ED will be helpful to evaluate her Per patient request sent in Zofran ODT for use over the weekend  -- Red flags discussed.   -- Will forward to PCP.  Vonna Drafts, MD [facility], PGY-1

## 2023-01-07 ENCOUNTER — Emergency Department (HOSPITAL_COMMUNITY): Payer: MEDICAID

## 2023-01-07 ENCOUNTER — Emergency Department (HOSPITAL_COMMUNITY)
Admission: EM | Admit: 2023-01-07 | Discharge: 2023-01-07 | Disposition: A | Discharge status: Home / Self Care | Payer: MEDICAID | Attending: Emergency Medicine | Admitting: Emergency Medicine

## 2023-01-07 ENCOUNTER — Telehealth: Payer: Self-pay | Admitting: Student

## 2023-01-07 ENCOUNTER — Other Ambulatory Visit: Payer: Self-pay

## 2023-01-07 ENCOUNTER — Encounter (HOSPITAL_COMMUNITY): Payer: Self-pay | Admitting: *Deleted

## 2023-01-07 DIAGNOSIS — E876 Hypokalemia: Secondary | ICD-10-CM | POA: Diagnosis not present

## 2023-01-07 DIAGNOSIS — R1084 Generalized abdominal pain: Secondary | ICD-10-CM | POA: Insufficient documentation

## 2023-01-07 DIAGNOSIS — I10 Essential (primary) hypertension: Secondary | ICD-10-CM | POA: Insufficient documentation

## 2023-01-07 DIAGNOSIS — R112 Nausea with vomiting, unspecified: Secondary | ICD-10-CM | POA: Insufficient documentation

## 2023-01-07 LAB — URINALYSIS, ROUTINE W REFLEX MICROSCOPIC
Bilirubin Urine: NEGATIVE
Glucose, UA: NEGATIVE mg/dL
Hgb urine dipstick: NEGATIVE
Ketones, ur: 5 mg/dL — AB
Leukocytes,Ua: NEGATIVE
Nitrite: NEGATIVE
Protein, ur: 30 mg/dL — AB
Specific Gravity, Urine: 1.028 (ref 1.005–1.030)
pH: 6 (ref 5.0–8.0)

## 2023-01-07 LAB — CBC
HCT: 41.3 % (ref 36.0–46.0)
Hemoglobin: 14.1 g/dL (ref 12.0–15.0)
MCH: 28.8 pg (ref 26.0–34.0)
MCHC: 34.1 g/dL (ref 30.0–36.0)
MCV: 84.5 fL (ref 80.0–100.0)
Platelets: 326 10*3/uL (ref 150–400)
RBC: 4.89 MIL/uL (ref 3.87–5.11)
RDW: 14.4 % (ref 11.5–15.5)
WBC: 8.9 10*3/uL (ref 4.0–10.5)
nRBC: 0 % (ref 0.0–0.2)

## 2023-01-07 LAB — BASIC METABOLIC PANEL
Anion gap: 12 (ref 5–15)
BUN: 14 mg/dL (ref 6–20)
CO2: 27 mmol/L (ref 22–32)
Calcium: 9.4 mg/dL (ref 8.9–10.3)
Chloride: 99 mmol/L (ref 98–111)
Creatinine, Ser: 0.88 mg/dL (ref 0.44–1.00)
GFR, Estimated: >60 mL/min (ref >60)
Glucose, Bld: 127 mg/dL — ABNORMAL HIGH (ref 70–99)
Potassium: 2.9 mmol/L — ABNORMAL LOW (ref 3.5–5.1)
Sodium: 138 mmol/L (ref 135–145)

## 2023-01-07 LAB — LIPASE, BLOOD: Lipase: 27 U/L (ref 11–51)

## 2023-01-07 LAB — TROPONIN I (HIGH SENSITIVITY): Troponin I (High Sensitivity): 8 ng/L (ref <18)

## 2023-01-07 MED ORDER — ALUM & MAG HYDROXIDE-SIMETH 200-200-20 MG/5ML PO SUSP
30.0000 mL | Freq: Once | ORAL | Status: AC
  Administered 2023-01-07: 30 mL via ORAL
  Filled 2023-01-07: qty 30

## 2023-01-07 MED ORDER — POTASSIUM CHLORIDE CRYS ER 20 MEQ PO TBCR
40.0000 meq | EXTENDED_RELEASE_TABLET | Freq: Once | ORAL | Status: AC
  Administered 2023-01-07: 40 meq via ORAL
  Filled 2023-01-07: qty 2

## 2023-01-07 MED ORDER — PROMETHAZINE HCL 12.5 MG PO TABS: 12.5000 mg | ORAL_TABLET | Freq: Three times a day (TID) | ORAL | 0 refills | Status: DC | PRN

## 2023-01-07 MED ORDER — POTASSIUM CHLORIDE CRYS ER 20 MEQ PO TBCR: 20.0000 meq | EXTENDED_RELEASE_TABLET | Freq: Two times a day (BID) | ORAL | 0 refills | Status: DC

## 2023-01-07 MED ORDER — MORPHINE SULFATE (PF) 4 MG/ML IV SOLN
4.0000 mg | Freq: Once | INTRAVENOUS | Status: AC
  Administered 2023-01-07: 4 mg via INTRAVENOUS
  Filled 2023-01-07: qty 1

## 2023-01-07 MED ORDER — ONDANSETRON HCL 4 MG/2ML IJ SOLN
4.0000 mg | Freq: Once | INTRAMUSCULAR | Status: AC
  Administered 2023-01-07: 4 mg via INTRAVENOUS
  Filled 2023-01-07: qty 2

## 2023-01-07 MED ORDER — ONDANSETRON HCL 4 MG PO TABS: 4.0000 mg | ORAL_TABLET | Freq: Four times a day (QID) | ORAL | 0 refills | Status: DC

## 2023-01-07 MED ORDER — SODIUM CHLORIDE 0.9 % IV BOLUS
1000.0000 mL | Freq: Once | INTRAVENOUS | Status: AC
  Administered 2023-01-07: 1000 mL via INTRAVENOUS
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
## 2023-01-07 NOTE — ED Notes (Signed)
Patient ambulated to restroom with one person assistance and returned to bed without problems and tolerated PO intake without nausea or vomiting.

## 2023-01-07 NOTE — Discharge Instructions (Addendum)
It was a pleasure taking care of you today.  You were evaluated in the emergency room for nausea and vomiting.  Your lab work was notable for low potassium, likely due to the vomiting.  Is prescription for nausea and potassium supplements have been sent into your pharmacy.  Please complete the potassium supplements and use the antinausea medication, Zofran as needed.  Please keep the appointment with your primary care provider and return here if you experience any new or worsening symptoms including persistent vomiting, worsening abdominal pain, fevers and chills.

## 2023-01-07 NOTE — ED Notes (Signed)
Patient requesting pain meds, md notified.

## 2023-01-07 NOTE — Addendum Note (Signed)
Addended by: Tiffany Kocher on: 01/07/2023 05:29 PM   Modules accepted: Orders

## 2023-01-07 NOTE — ED Triage Notes (Signed)
Pt states that she is having chest pain(upper chest, "aching" "throbbing" burning"). She says that she is dizzy, fell about 1 hour ago. N/v and abdominal cramping since Wednesday. Pt states she was seen at [facility] on Thursday, but symptoms have not improved.

## 2023-01-07 NOTE — ED Notes (Signed)
BNP and lipase need to be redrawn. Blood hemolyzed.

## 2023-01-07 NOTE — Telephone Encounter (Signed)
**  After Hours/ Emergency Line Call**  Received a page to call [phone]) - [phone].  Patient: Holly Mendez  Caller: Self  Confirmed name & DOB of patient with caller  Subjective:  - Nausea vomiting since Wednesday 12/25 - Discharged from ED this morning, given Zofran - Reports she is still having vomiting, cannot keep any food or water down - She is requesting admission to the hospital - She has appointment to see [name] tomorrow morning  Objective:  Observations: NAD, groaning in pain  Assessment & Plan  Holly Mendez is a 47 y.o. female with PMHx s/f chronic pancreatitis, GERD, fibroids who calls with the following complaints and concerns: Nausea, vomiting and abdominal pain  ED visit on 12/26 and 12/29 unremarkable, imaging studies were negative and no concern for ACS.  Given extent of her vomiting, will send medication other than Zofran to control nausea.  Using shared decision-making, we will try Phenergan.  Discussed ED precautions.  Discussed need for appointment tomorrow, patient will see [name] in the morning.  Recommendations:  Promethazine 12.5 mg tablet every 8 hours for nausea, counseled on side effects  -- Red flags discussed.   -- Will forward to PCP.  Tiffany Kocher, DO [facility], PGY-1

## 2023-01-07 NOTE — ED Triage Notes (Signed)
Pt arrives via [facility] with GI symptoms. c/o abdominal pain, n/v/d. 10/10 cramping, acid reflux. Recently broke her arm. VSS. IV established in the right AC, Pt given Zofran en route.

## 2023-01-08 ENCOUNTER — Telehealth: Payer: Self-pay

## 2023-01-08 ENCOUNTER — Ambulatory Visit: Payer: Self-pay | Admitting: Student

## 2023-01-08 ENCOUNTER — Ambulatory Visit: Payer: MEDICAID

## 2023-01-08 NOTE — Telephone Encounter (Signed)
Patient returns call to nurse line.   She is having continued symptoms (nausea and vomiting). Zofran not working. Unable to keep down water.   Prescribed phenergan by on call doctor last night. She states that she is not able to get out of her house to come in to the doctor, "much less the pharmacy."   Patient tearful on the phone saying that "no one is listening to her."   Given that she is not tolerating fluids and has worsened symptoms, advised that she be evaluated in the ED.   Patient is adamant that she does not want to go to the ED, as she sat in the ER for 13 hours yesterday.   She wants to receive direct admission. Advised that I would need to forward message to covering provider for Dr. Jennette Kettle as she is out of the office this week. She expressed frustration with everything and states "do I have to threaten to hurt myself to be admitted?" When I attempted to ask further questions about this, she just states that she feels like she is not getting the care that she needs.   Patient is current in the home with her son. Reiterated ED evaluation. Patient declines.   [name], RN

## 2023-01-08 NOTE — Telephone Encounter (Signed)
Patient LVM on nurse line requesting direct admission into the hospital due to current illness.   She reports that she is unable to come in for appointment today because she is so sick.   She states,"what do I have to do to get admitted?"   Returned call to patient. She did not answer, LVM asking that she return call to office to discuss further.   [name], RN

## 2023-01-08 NOTE — Telephone Encounter (Signed)
Called and spoke with her She relates she continues to vomit and is not keeping down any fluids.   Her husband and son are caring for her. I explained that she would need to evaluated in a place that could check her blood stat and to administer IV fluids.  She forcefully and repeatedly said she would not go back to the ER. She spoke in a clear strong voice and she could recall her recent visits to the ER.  She requested direct admission. I informed her it would not be safe to directly admit her without first assessing her and obtaining stat blood tests which we could not do in the [facility]. I again recommended she call an ambulance and go to the ER and that if she did not she could continue to worsen which could be life threatening. She said she would not.  I again recommended the above and if she did not now but continued to worsen she could at any time. She hung up.

## 2023-01-12 NOTE — Telephone Encounter (Signed)
 Patient calls nurse line on 1/2 reporting continued nausea.   She reports the vomiting has improved and she has been able to tolerate fluids. She does report she has still been very nauseated and has not been able to eat well.   She denies any worsening symptoms.   She requests to be seen as soon as possible.   Patient scheduled for Monday for evaluation.   Weekend precautions discussed with patient.

## 2023-01-15 ENCOUNTER — Ambulatory Visit: Payer: MEDICAID | Admitting: Family Medicine

## 2023-01-22 ENCOUNTER — Other Ambulatory Visit: Payer: Self-pay

## 2023-01-22 MED ORDER — HYDROCODONE-ACETAMINOPHEN 5-325 MG PO TABS: ORAL_TABLET | ORAL | 0 refills | Status: DC

## 2023-01-23 ENCOUNTER — Telehealth: Payer: Self-pay

## 2023-01-23 ENCOUNTER — Other Ambulatory Visit: Payer: Self-pay | Admitting: Family Medicine

## 2023-01-23 DIAGNOSIS — N898 Other specified noninflammatory disorders of vagina: Secondary | ICD-10-CM

## 2023-01-23 NOTE — Telephone Encounter (Signed)
 Pharmacy calls nurse line in regards to Diflucan medications.   Pharmacists reports two separate Diflucan prescriptions were sent over today.   She is unsure which one to fill or is the patient on both?   Advised will forward to PCP for advisement.

## 2023-01-24 ENCOUNTER — Ambulatory Visit (INDEPENDENT_AMBULATORY_CARE_PROVIDER_SITE_OTHER): Payer: MEDICAID | Admitting: Family Medicine

## 2023-01-24 ENCOUNTER — Encounter: Payer: Self-pay | Admitting: Family Medicine

## 2023-01-24 VITALS — BP 127/75 | HR 103 | Ht 65.0 in | Wt 241.6 lb

## 2023-01-24 DIAGNOSIS — I10 Essential (primary) hypertension: Secondary | ICD-10-CM

## 2023-01-24 DIAGNOSIS — G8929 Other chronic pain: Secondary | ICD-10-CM

## 2023-01-24 DIAGNOSIS — F3170 Bipolar disorder, currently in remission, most recent episode unspecified: Secondary | ICD-10-CM

## 2023-01-24 NOTE — Patient Instructions (Signed)
 I will send you a note about your labs. Let me plan on seeing you in a month.

## 2023-01-25 ENCOUNTER — Other Ambulatory Visit: Payer: MEDICAID

## 2023-01-25 ENCOUNTER — Encounter: Payer: Self-pay | Admitting: Family Medicine

## 2023-01-25 LAB — COMPREHENSIVE METABOLIC PANEL
ALT: 15 [IU]/L (ref 0–32)
AST: 19 [IU]/L (ref 0–40)
Albumin: 4.4 g/dL (ref 3.9–4.9)
Alkaline Phosphatase: 77 [IU]/L (ref 44–121)
BUN/Creatinine Ratio: 13 (ref 9–23)
BUN: 10 mg/dL (ref 6–24)
Bilirubin Total: 0.4 mg/dL (ref 0.0–1.2)
CO2: 22 mmol/L (ref 20–29)
Calcium: 9.8 mg/dL (ref 8.7–10.2)
Chloride: 103 mmol/L (ref 96–106)
Creatinine, Ser: 0.77 mg/dL (ref 0.57–1.00)
Globulin, Total: 2.9 g/dL (ref 1.5–4.5)
Glucose: 112 mg/dL — ABNORMAL HIGH (ref 70–99)
Potassium: 4.3 mmol/L (ref 3.5–5.2)
Sodium: 140 mmol/L (ref 134–144)
Total Protein: 7.3 g/dL (ref 6.0–8.5)
eGFR: 95 mL/min/{1.73_m2} (ref >59)

## 2023-01-25 LAB — URIC ACID: Uric Acid: 4.3 mg/dL (ref 2.6–6.2)

## 2023-01-25 MED ORDER — TOPIRAMATE 100 MG PO TABS: 100.0000 mg | ORAL_TABLET | Freq: Two times a day (BID) | ORAL | Status: DC

## 2023-01-25 NOTE — Assessment & Plan Note (Addendum)
At this point she is dealing with 3 orthopedic injuries, 2 meniscal tears and a left forearm fracture.  I appreciate her checking on the pain contract.  I will agree to manage her opioids currently and she will tell orthopedics that she no longer needs any medications from them.  No red flags.  I will follow her up in 1 month  She also has some questions about whether or not this could be gout as well.  Will check uric acid level.

## 2023-01-25 NOTE — Progress Notes (Signed)
    CHIEF COMPLAINT / HPI: #1.  Follow-up left arm fracture.  Has had ORIF and is in physical therapy.  Still having a lot of pain and weakness.  Has questions about pain management.  She told her orthopedist not to give her any more pain meds because she did not want to mess up her pain contract with's.  Said pain meds I have her on are working fine for that as well as her bilateral knee issues. 2.  Recurrent yeast vaginitis.  Wanted a refill on her Diflucan. 3.  Depression has been a little worse.  She is increased her therapy to twice a week.  Her psychiatrist increased and changed some of her medicines but she is not exactly sure of the changes.  Sounds like he added Abilify?  She is feeling a little bit better. 4. F/u elevated BP at ED (arm fracture) taking meds regularly. No Headaches or SOB   PERTINENT  PMH / PSH: I have reviewed the patient's medications, allergies, past medical and surgical history, smoking status and updated in the EMR as appropriate.   OBJECTIVE:  BP 127/75   Pulse (!) 103   Ht 5\' 5"  (1.651 m)   Wt 241 lb 9.6 oz (109.6 kg)   LMP 08/31/2018   SpO2 100%   BMI 40.20 kg/m  Vital signs reviewed. GENERAL: Well-developed, well-nourished, no acute distress. CARDIOVASCULAR: Regular rate and rhythm no murmur gallop or rub LUNGS: Clear to auscultation bilaterally, no rales or wheeze. ABDOMEN: Soft positive bowel sounds NEURO: No gross focal neurological deficits. MSK: Movement of extremity x 4. Healing volar wrist scar on left arm. Stilll lacks full supination. No sign of infection.  PSYCH: AxOx4. Good eye contact.. No psychomotor retardation or agitation. Appropriate speech fluency and content. Asks and answers questions appropriately. Mood is congruent, slightly depressed.    ASSESSMENT / PLAN:   Bipolar affective disorder, currently in remission [facility]) We discussed the best way for her to address this is to continue her medication through psychiatry which she is  doing.  Applauded her effort by increasing her therapy time.  Hypertension Blood pressure is well-controlled.  She had had some elevated blood pressures when she was seen in the emergency department but notably she had fractured arm at the time.  Will check BMP today.  Encounter for chronic pain management At this point she is dealing with 3 orthopedic injuries, 2 meniscal tears and a left forearm fracture.  I appreciate her checking on the pain contract.  I will agree to manage her opioids currently and she will tell orthopedics that she no longer needs any medications from them.  No red flags.  I will follow her up in 1 month  She also has some questions about whether or not this could be gout as well.  Will check uric acid level.   Denny Levy MD

## 2023-01-25 NOTE — Assessment & Plan Note (Signed)
We discussed the best way for her to address this is to continue her medication through psychiatry which she is doing.  Applauded her effort by increasing her therapy time.

## 2023-01-25 NOTE — Telephone Encounter (Signed)
Discussed with pharmacy.   They will fill fluconazole 200 mg tabs, #7, sig one po every day for 7 days.   No further action required.

## 2023-01-25 NOTE — Assessment & Plan Note (Signed)
Blood pressure is well-controlled.  She had had some elevated blood pressures when she was seen in the emergency department but notably she had fractured arm at the time.  Will check BMP today.

## 2023-01-26 ENCOUNTER — Telehealth: Payer: Self-pay

## 2023-01-26 NOTE — Telephone Encounter (Signed)
Patient calls nurse line regarding results from 01/24/23. She states that she is sure that she has gout due to continued foot and knee pain.   She reports that she was unable to go to an appointment today due to this pain.   Requesting returned call from PCP to discuss this further.   [name], RN

## 2023-01-29 NOTE — Telephone Encounter (Signed)
I spoke with her 01/26/2023.Holly Mendez She does not have gout. There are many other reasons she could be having pain, but it is not gout.

## 2023-02-01 ENCOUNTER — Telehealth: Payer: Self-pay

## 2023-02-01 NOTE — Telephone Encounter (Signed)
Patient calls nurse line in regards to Hydrocodone.   She reports she will no longer accept Oxycodone from [facility].   She reports this was discussed at her last OV with PCP.   She reports at next fill she would like the increased Hydrocodone that was discussed.   Advised will forward to PCP.

## 2023-02-06 NOTE — Telephone Encounter (Signed)
Spoke with patient.   She is under the impression she would be getting more than #120.   Advised she can discuss further with PCP at 2/19 visit.

## 2023-02-12 ENCOUNTER — Encounter: Payer: Self-pay | Admitting: Physician Assistant

## 2023-02-12 ENCOUNTER — Ambulatory Visit (INDEPENDENT_AMBULATORY_CARE_PROVIDER_SITE_OTHER): Payer: MEDICAID | Admitting: Physician Assistant

## 2023-02-12 VITALS — Wt 230.0 lb

## 2023-02-12 DIAGNOSIS — M1711 Unilateral primary osteoarthritis, right knee: Secondary | ICD-10-CM | POA: Diagnosis not present
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
## 2023-02-16 ENCOUNTER — Other Ambulatory Visit: Payer: Self-pay

## 2023-02-16 ENCOUNTER — Ambulatory Visit
Admission: RE | Admit: 2023-02-16 | Discharge: 2023-02-16 | Disposition: A | Discharge status: Home / Self Care | Payer: MEDICAID | Source: Ambulatory Visit | Attending: Family Medicine | Admitting: Family Medicine

## 2023-02-16 ENCOUNTER — Other Ambulatory Visit: Payer: MEDICAID

## 2023-02-16 ENCOUNTER — Ambulatory Visit: Payer: MEDICAID

## 2023-02-16 DIAGNOSIS — N644 Mastodynia: Secondary | ICD-10-CM

## 2023-02-16 MED ORDER — HYDROCODONE-ACETAMINOPHEN 5-325 MG PO TABS: ORAL_TABLET | ORAL | 0 refills | Status: DC

## 2023-02-21 ENCOUNTER — Other Ambulatory Visit: Payer: Self-pay

## 2023-02-21 ENCOUNTER — Telehealth: Payer: Self-pay

## 2023-02-21 NOTE — Telephone Encounter (Signed)
Patient calls nurse line regarding PA being needed for hydrocodone-acetaminophen.   PA submitted via covermymeds.   Key: [id number]  [name], RN

## 2023-02-22 ENCOUNTER — Other Ambulatory Visit: Payer: Self-pay | Admitting: Family Medicine

## 2023-02-22 ENCOUNTER — Encounter: Payer: Self-pay | Admitting: Radiology

## 2023-02-22 ENCOUNTER — Telehealth: Payer: Self-pay | Admitting: Orthopaedic Surgery

## 2023-02-22 ENCOUNTER — Telehealth: Payer: Self-pay

## 2023-02-22 DIAGNOSIS — N898 Other specified noninflammatory disorders of vagina: Secondary | ICD-10-CM

## 2023-02-22 NOTE — Telephone Encounter (Signed)
Holly Mendez with [employer] calls nurse line to discuss patients pain medication.   She reports she wanted to make PCP aware she was still picking up Oxycodone from Ortho.  She reports she picked up #40 tabs of Oxycodone on 1/24, 1/31 and 2/6.   She reports she picked up her #120 of Hydrocodone from our office on 2/12.  She reports the patient has already called this morning requesting her #40 for this week from Ortho. Holly Mendez reports she will alert her Ortho provider as well.  Advised will forward to PCP.

## 2023-02-22 NOTE — Telephone Encounter (Signed)
Patient called asked if she can get a call back concerning her needing a letter for her attorney. Patient said she need to let Dr. Magnus Ivan know what the letter need to read before it is faxed to her attorney. Patient said the letter need the date of the surgery,recovery time and how long (PT) will be. The fax number to the attorney is   [phone] Attn:  Royal Hawthorn. Ph# [phone] The number to contact patient is [phone]

## 2023-02-22 NOTE — Telephone Encounter (Signed)
Faxed letter

## 2023-02-22 NOTE — Telephone Encounter (Signed)
Left voicemail for return call

## 2023-02-23 ENCOUNTER — Telehealth: Payer: Self-pay | Admitting: Orthopaedic Surgery

## 2023-02-23 NOTE — Telephone Encounter (Signed)
noted

## 2023-02-23 NOTE — Telephone Encounter (Signed)
Patient changed her pharmacy to  [facility]  [address]

## 2023-02-27 ENCOUNTER — Telehealth (INDEPENDENT_AMBULATORY_CARE_PROVIDER_SITE_OTHER): Payer: MEDICAID | Admitting: Family Medicine

## 2023-02-27 DIAGNOSIS — Z0000 Encounter for general adult medical examination without abnormal findings: Secondary | ICD-10-CM

## 2023-02-27 NOTE — Telephone Encounter (Signed)
 Called patient to reschedule her appointment for February 19th with Dr Jennette Kettle. Before hanging up patient asked that I send a message to Dr Jennette Kettle with the following information:  Patient states she saw Dr Melvyn Novas this morning and he has stopped her Oxycodone and requested that her hydrocodone be increased.   Please call patient with any questions.

## 2023-02-27 NOTE — Telephone Encounter (Unsigned)
 [facility] Telemedicine Visit  Patient consented to have virtual visit and was identified by name and date of birth. Method of visit: Telephone  Encounter participants: Patient: Holly Mendez - located at home Provider: Denny Levy - located at office Others (if applicable): Not applicable  Chief Complaint: Follow-up pain medicine  HPI: Appointment scheduled for tomorrow but office is going to be closed secondary to weather conditions so I called her.  Video not available.  Wanted to discuss issue with pain medications.  I had been giving her chronic pain medicine and she had gotten 2 or 3 short-term doses of OxyContin because she has had a couple of orthopedic procedures.  Pharmacy notified both her orthopedist office in my office.  She wanted to apologize and said she was not clear that this was an issue.  We discussed.  She will not be getting any more pain medicine from orthopedics and I will continue her current dose of hydrocodone with the thoughts that we will taper it off over the next few months.  She does have TKR scheduled on February 25 and for that it is okay for her orthopedist to give her 5 to 7 days for acute pain and she is not to use the hydrocodone during that time.  I will try to send a note to the orthopedist to be aware.  She is apologetic.  Will get her scheduled back in the office for just a regular follow-up after her TKR.    She appreciated the call as our office is closed tomorrow due to weather.    Exam:  LMP 08/31/2018   Respiratory: Nonlabored PSYCH: AxOx4. Good eye contact.. No psychomotor retardation or agitation. Appropriate speech fluency and content. Asks and answers questions appropriately. Mood is congruent.   Assessment/Plan: Chronic pain management: I will continue her hydrocodone.  Orthopedics will not give her any more oxycodone for her hand/wrist.  She does have upcoming TKR and I will work with that orthopedist  regarding acute pain after her knee.  I think it would be fine for the orthopedist that does her TKR to give her 5 to 7 days of acute pain medicine and we will have her stop what we are giving her for those days and then restart hours.  We would subtract this from her total dose for the month.  She is in agreement. No problem-specific Assessment & Plan notes found for this encounter.    Time spent during visit with patient: 21 minutes  {Billing info - this will automatically delete when the note is signed:1} {For Audio only, bill [id number] / [phone] as usual with modifier 93 attached:1} {For Audio & Video, bill [id number] / [phone] as usual with modifier 95 attached:1}

## 2023-02-28 ENCOUNTER — Ambulatory Visit: Payer: MEDICAID | Admitting: Family Medicine

## 2023-02-28 ENCOUNTER — Other Ambulatory Visit (HOSPITAL_COMMUNITY): Payer: MEDICAID

## 2023-03-02 NOTE — Pre-Procedure Instructions (Signed)
 Surgical Instructions   Your procedure is scheduled on March 06, 2023. Report to [facility] Main Entrance "A" at 10:15 A.M., then check in with the Admitting office. Any questions or running late day of surgery: call [phone]  Questions prior to your surgery date: call [phone], Monday-Friday, 8am-4pm. If you experience any cold or flu symptoms such as cough, fever, chills, shortness of breath, etc. between now and your scheduled surgery, please notify us at the above number.     Remember:  Do not eat after midnight the night before your surgery   You may drink clear liquids until 9:15 AM the morning of your surgery.   Clear liquids allowed are: Water, Non-Citrus Juices (without pulp), Carbonated Beverages, Clear Tea (no milk, honey, etc.), Black Coffee Only (NO MILK, CREAM OR POWDERED CREAMER of any kind), and Gatorade.  Patient Instructions  The night before surgery:  No food after midnight. ONLY clear liquids after midnight  The day of surgery (if you do NOT have diabetes):  Drink ONE (1) Pre-Surgery Clear Ensure by 9:15 AM the morning of surgery. Drink in one sitting. Do not sip.  This drink was given to you during your hospital  pre-op appointment visit.  Nothing else to drink after completing the  Pre-Surgery Clear Ensure.         If you have questions, please contact your surgeon's office.    Take these medicines the morning of surgery with A SIP OF WATER: cetirizine (ZYRTEC)  divalproex (DEPAKOTE)  fluconazole (DIFLUCAN)  HYDROcodone-acetaminophen (NORCO/VICODIN)  hydrOXYzine (ATARAX)    May take these medicines IF NEEDED: esomeprazole (NEXIUM)  NARCAN nasal spray  SYMBICORT inhaler  VENTOLIN HFA  inhaler - please bring inhaler with you morning of surgery   One week prior to surgery, STOP taking any Aspirin (unless otherwise instructed by your surgeon) Aleve, Naproxen, Ibuprofen, Motrin, Advil, Goody's, BC's, all herbal medications, fish oil, and  non-prescription vitamins.                     Do NOT Smoke (Tobacco/Vaping) for 24 hours prior to your procedure.  If you use a CPAP at night, you may bring your mask/headgear for your overnight stay.   You will be asked to remove any contacts, glasses, piercing's, hearing aid's, dentures/partials prior to surgery. Please bring cases for these items if needed.    Patients discharged the day of surgery will not be allowed to drive home, and someone needs to stay with them for 24 hours.  SURGICAL WAITING ROOM VISITATION Patients may have no more than 2 support people in the waiting area - these visitors may rotate.   Pre-op nurse will coordinate an appropriate time for 1 ADULT support person, who may not rotate, to accompany patient in pre-op.  Children under the age of 37 must have an adult with them who is not the patient and must remain in the main waiting area with an adult.  If the patient needs to stay at the hospital during part of their recovery, the visitor guidelines for inpatient rooms apply.  Please refer to the [facility] website for the visitor guidelines for any additional information.   If you received a COVID test during your pre-op visit  it is requested that you wear a mask when out in public, stay away from anyone that may not be feeling well and notify your surgeon if you develop symptoms. If you have been in contact with anyone that has tested positive in  the last 10 days please notify you surgeon.      Pre-operative CHG Bathing Instructions   You can play a key role in reducing the risk of infection after surgery. Your skin needs to be as free of germs as possible. You can reduce the number of germs on your skin by washing with CHG (chlorhexidine gluconate) soap before surgery. CHG is an antiseptic soap that kills germs and continues to kill germs even after washing.   DO NOT use if you have an allergy to chlorhexidine/CHG or antibacterial soaps. If your skin  becomes reddened or irritated, stop using the CHG and notify one of our RNs at [phone].              TAKE A SHOWER THE NIGHT BEFORE SURGERY AND THE DAY OF SURGERY    Please keep in mind the following:  DO NOT shave, including legs and underarms, 48 hours prior to surgery.   You may shave your face before/day of surgery.  Place clean sheets on your bed the night before surgery Use a clean washcloth (not used since being washed) for each shower. DO NOT sleep with pet's night before surgery.  CHG Shower Instructions:  Wash your face and private area with normal soap. If you choose to wash your hair, wash first with your normal shampoo.  After you use shampoo/soap, rinse your hair and body thoroughly to remove shampoo/soap residue.  Turn the water OFF and apply half the bottle of CHG soap to a CLEAN washcloth.  Apply CHG soap ONLY FROM YOUR NECK DOWN TO YOUR TOES (washing for 3-5 minutes)  DO NOT use CHG soap on face, private areas, open wounds, or sores.  Pay special attention to the area where your surgery is being performed.  If you are having back surgery, having someone wash your back for you may be helpful. Wait 2 minutes after CHG soap is applied, then you may rinse off the CHG soap.  Pat dry with a clean towel  Put on clean pajamas    Additional instructions for the day of surgery: DO NOT APPLY any lotions, deodorants, cologne, or perfumes.   Do not wear jewelry or makeup Do not wear nail polish, gel polish, artificial nails, or any other type of covering on natural nails (fingers and toes) Do not bring valuables to the hospital. [facility] is not responsible for valuables/personal belongings. Put on clean/comfortable clothes.  Please brush your teeth.  Ask your nurse before applying any prescription medications to the skin.

## 2023-03-02 NOTE — Telephone Encounter (Signed)
 Patient calls nurse line regarding pain management.   She reports that she is scheduled to have knee replacement next Tuesday, 03/06/23. States that she is not going to receive pain medications at discharge from Dr. Magnus Ivan.   She is requesting an increase in her pain medications, as she will not be receiving medications from any other provider.   Advised that I would forward this message to PCP.   [name], RN

## 2023-03-05 ENCOUNTER — Other Ambulatory Visit: Payer: Self-pay

## 2023-03-05 ENCOUNTER — Encounter (HOSPITAL_COMMUNITY): Payer: Self-pay

## 2023-03-05 ENCOUNTER — Encounter (HOSPITAL_COMMUNITY)
Admission: RE | Admit: 2023-03-05 | Discharge: 2023-03-05 | Disposition: A | Discharge status: Home / Self Care | Payer: MEDICAID | Source: Ambulatory Visit | Attending: Orthopaedic Surgery | Admitting: Orthopaedic Surgery

## 2023-03-05 VITALS — BP 115/89 | HR 85 | Temp 98.2°F | Resp 17 | Ht 65.0 in | Wt 229.3 lb

## 2023-03-05 DIAGNOSIS — Z01812 Encounter for preprocedural laboratory examination: Secondary | ICD-10-CM | POA: Diagnosis not present

## 2023-03-05 DIAGNOSIS — Z01818 Encounter for other preprocedural examination: Secondary | ICD-10-CM

## 2023-03-05 DIAGNOSIS — M1711 Unilateral primary osteoarthritis, right knee: Secondary | ICD-10-CM | POA: Insufficient documentation

## 2023-03-05 LAB — CBC
HCT: 40 % (ref 36.0–46.0)
Hemoglobin: 13.5 g/dL (ref 12.0–15.0)
MCH: 29 pg (ref 26.0–34.0)
MCHC: 33.8 g/dL (ref 30.0–36.0)
MCV: 85.8 fL (ref 80.0–100.0)
Platelets: 279 10*3/uL (ref 150–400)
RBC: 4.66 MIL/uL (ref 3.87–5.11)
RDW: 14 % (ref 11.5–15.5)
WBC: 6.2 10*3/uL (ref 4.0–10.5)
nRBC: 0 % (ref 0.0–0.2)

## 2023-03-05 LAB — BASIC METABOLIC PANEL
Anion gap: 8 (ref 5–15)
BUN: 6 mg/dL (ref 6–20)
CO2: 24 mmol/L (ref 22–32)
Calcium: 9.8 mg/dL (ref 8.9–10.3)
Chloride: 103 mmol/L (ref 98–111)
Creatinine, Ser: 0.72 mg/dL (ref 0.44–1.00)
GFR, Estimated: >60 mL/min (ref >60)
Glucose, Bld: 122 mg/dL — ABNORMAL HIGH (ref 70–99)
Potassium: 3.7 mmol/L (ref 3.5–5.1)
Sodium: 135 mmol/L (ref 135–145)

## 2023-03-05 LAB — SURGICAL PCR SCREEN
MRSA, PCR: NEGATIVE
Staphylococcus aureus: NEGATIVE

## 2023-03-05 NOTE — Telephone Encounter (Signed)
 Patient calling again regarding this increase in pain meds. Patient also requesting a telephone call/visit from Dr. Jennette Kettle since she is going into surgery tomorrow. Please advise.

## 2023-03-05 NOTE — Anesthesia Preprocedure Evaluation (Signed)
 Anesthesia Evaluation  Patient identified by MRN, date of birth, ID band Patient awake    Reviewed: Allergy & Precautions, NPO status , Patient's Chart, lab work & pertinent test results  History of Anesthesia Complications Negative for: history of anesthetic complications  Airway Mallampati: III  TM Distance: >3 FB Neck ROM: Full   Comment: Previous grade I view with MAC 3, easy mask Dental  (+) Dental Advisory Given   Pulmonary neg shortness of breath, asthma (last used inhaler last week) , neg sleep apnea, neg COPD, neg recent URI   Pulmonary exam normal breath sounds clear to auscultation       Cardiovascular hypertension (clonidine), Pt. on medications (-) angina (-) Past MI, (-) Cardiac Stents and (-) CABG (-) dysrhythmias  Rhythm:Regular Rate:Normal     Neuro/Psych neg Seizures PSYCHIATRIC DISORDERS Anxiety Depression Bipolar Disorder Schizophrenia  H/o meningitis 2016  Neuromuscular disease (chronic low back pain)    GI/Hepatic Neg liver ROS,GERD  Medicated,,Chronic pancreatitis   Endo/Other  Pre-diabetes  Renal/GU negative Renal ROS     Musculoskeletal  (+) Arthritis , Osteoarthritis,  Fibromyalgia -  Abdominal  (+) + obese  Peds  Hematology negative hematology ROS (+) Lab Results      Component                Value               Date                      WBC                      6.2                 03/05/2023                HGB                      13.5                03/05/2023                HCT                      40.0                03/05/2023                MCV                      85.8                03/05/2023                PLT                      279                 03/05/2023              Anesthesia Other Findings Patient reports stopping her psych meds on 03/02/2023. She is anxious this morning.  Reproductive/Obstetrics                             Anesthesia  Physical Anesthesia Plan  ASA: 3  Anesthesia Plan: MAC and Spinal   Post-op Pain Management:  Regional block* and Tylenol PO (pre-op)*   Induction: Intravenous  PONV Risk Score and Plan: 2 and Ondansetron, Dexamethasone, Propofol infusion and Treatment may vary due to age or medical condition  Airway Management Planned: Natural Airway and Simple Face Mask  Additional Equipment:   Intra-op Plan:   Post-operative Plan:   Informed Consent: I have reviewed the patients History and Physical, chart, labs and discussed the procedure including the risks, benefits and alternatives for the proposed anesthesia with the patient or authorized representative who has indicated his/her understanding and acceptance.     Dental advisory given  Plan Discussed with: CRNA and Anesthesiologist  Anesthesia Plan Comments: (Discussed potential risks of nerve blocks including, but not limited to, infection, bleeding, nerve damage, seizures, pneumothorax, respiratory depression, and potential failure of the block. Alternatives to nerve blocks discussed. All questions answered.  I have discussed risks of neuraxial anesthesia including but not limited to infection, bleeding, nerve injury, back pain, headache, seizures, and failure of block. Patient denies bleeding disorders and is not currently anticoagulated. Labs have been reviewed. Risks and benefits discussed. All patient's questions answered.   Discussed with patient risks of MAC including, but not limited to, minor pain or discomfort, hearing people in the room, and possible need for backup general anesthesia. Risks for general anesthesia also discussed including, but not limited to, sore throat, hoarse voice, chipped/damaged teeth, injury to vocal cords, nausea and vomiting, allergic reactions, lung infection, heart attack, stroke, and death. All questions answered. )        Anesthesia Quick Evaluation

## 2023-03-05 NOTE — Progress Notes (Addendum)
 PCP - Dr. Denny Levy Cardiologist - Denies  PPM/ICD - Denies Device Orders - n/a Rep Notified - n/a  Chest x-ray - 01/07/2023 EKG - 01/07/2023 Stress Test - Denies ECHO - Denies Cardiac Cath - Denies  Sleep Study - Denies CPAP - n/a  Pt is Pre-DM  Last dose of GLP1 agonist-  n/a GLP1 instructions: n/a  Blood Thinner Instructions: n/a Aspirin Instructions: n/a  ERAS Protcol - Clear liquids until 0915 morning of surgery PRE-SURGERY Ensure or G2- Ensure given to pt with instructions  COVID TEST- n/a   Anesthesia review: No. Pt is on diflucan PRN due to frequent yeast infections from taking multiple medications.  Patient denies shortness of breath, fever, cough and chest pain at PAT appointment. Pt denies any respiratory illness/infection in the last two months.   All instructions explained to the patient, with a verbal understanding of the material. Patient agrees to go over the instructions while at home for a better understanding. Patient also instructed to self quarantine after being tested for COVID-19. The opportunity to ask questions was provided.
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
## 2023-03-06 ENCOUNTER — Encounter: Payer: Self-pay | Admitting: Family Medicine

## 2023-03-06 ENCOUNTER — Other Ambulatory Visit: Payer: Self-pay

## 2023-03-06 ENCOUNTER — Ambulatory Visit (HOSPITAL_BASED_OUTPATIENT_CLINIC_OR_DEPARTMENT_OTHER): Payer: MEDICAID | Admitting: Anesthesiology

## 2023-03-06 ENCOUNTER — Observation Stay (HOSPITAL_COMMUNITY): Payer: MEDICAID

## 2023-03-06 ENCOUNTER — Ambulatory Visit (HOSPITAL_COMMUNITY): Payer: MEDICAID | Admitting: Anesthesiology

## 2023-03-06 ENCOUNTER — Encounter (HOSPITAL_COMMUNITY)
Admission: RE | Disposition: A | Discharge status: Home / Self Care | Payer: Self-pay | Source: Home / Self Care | Attending: Orthopaedic Surgery

## 2023-03-06 ENCOUNTER — Encounter (HOSPITAL_COMMUNITY): Payer: Self-pay | Admitting: Orthopaedic Surgery

## 2023-03-06 ENCOUNTER — Observation Stay (HOSPITAL_COMMUNITY)
Admission: RE | Admit: 2023-03-06 | Discharge: 2023-03-09 | Disposition: A | Discharge status: Home / Self Care | Payer: MEDICAID | Attending: Orthopaedic Surgery | Admitting: Orthopaedic Surgery

## 2023-03-06 DIAGNOSIS — Z96651 Presence of right artificial knee joint: Secondary | ICD-10-CM

## 2023-03-06 DIAGNOSIS — F418 Other specified anxiety disorders: Secondary | ICD-10-CM

## 2023-03-06 DIAGNOSIS — Z79899 Other long term (current) drug therapy: Secondary | ICD-10-CM | POA: Diagnosis not present

## 2023-03-06 DIAGNOSIS — I10 Essential (primary) hypertension: Secondary | ICD-10-CM | POA: Insufficient documentation

## 2023-03-06 DIAGNOSIS — J45909 Unspecified asthma, uncomplicated: Secondary | ICD-10-CM | POA: Insufficient documentation

## 2023-03-06 DIAGNOSIS — M1711 Unilateral primary osteoarthritis, right knee: Secondary | ICD-10-CM | POA: Diagnosis present

## 2023-03-06 HISTORY — PX: TOTAL KNEE ARTHROPLASTY: SHX125

## 2023-03-06 SURGERY — ARTHROPLASTY, KNEE, TOTAL
Anesthesia: Monitor Anesthesia Care | Site: Knee | Laterality: Right

## 2023-03-06 MED ORDER — ROPIVACAINE HCL 5 MG/ML IJ SOLN
INTRAMUSCULAR | Status: DC | PRN
  Administered 2023-03-06: 20 mL via PERINEURAL

## 2023-03-06 MED ORDER — LACTATED RINGERS IV SOLN: INTRAVENOUS | Status: DC

## 2023-03-06 MED ORDER — MIDAZOLAM HCL 2 MG/2ML IJ SOLN
INTRAMUSCULAR | Status: DC | PRN
  Administered 2023-03-06 (×2): 1 mg via INTRAVENOUS

## 2023-03-06 MED ORDER — FENTANYL CITRATE (PF) 250 MCG/5ML IJ SOLN
INTRAMUSCULAR | Status: DC | PRN
  Administered 2023-03-06: 75 ug via INTRAVENOUS

## 2023-03-06 MED ORDER — ONDANSETRON HCL 4 MG/2ML IJ SOLN
INTRAMUSCULAR | Status: DC | PRN
  Administered 2023-03-06: 4 mg via INTRAVENOUS

## 2023-03-06 MED ORDER — ALUM & MAG HYDROXIDE-SIMETH 200-200-20 MG/5ML PO SUSP: 30.0000 mL | ORAL | Status: DC | PRN

## 2023-03-06 MED ORDER — TRANEXAMIC ACID-NACL 1000-0.7 MG/100ML-% IV SOLN
1000.0000 mg | INTRAVENOUS | Status: AC
  Administered 2023-03-06: 1000 mg via INTRAVENOUS
  Filled 2023-03-06: qty 100

## 2023-03-06 MED ORDER — PROPOFOL 10 MG/ML IV BOLUS
INTRAVENOUS | Status: AC
  Filled 2023-03-06: qty 20

## 2023-03-06 MED ORDER — 0.9 % SODIUM CHLORIDE (POUR BTL) OPTIME
TOPICAL | Status: DC | PRN
  Administered 2023-03-06: 1000 mL

## 2023-03-06 MED ORDER — PROPOFOL 500 MG/50ML IV EMUL
INTRAVENOUS | Status: DC | PRN
  Administered 2023-03-06: 100 ug/kg/min via INTRAVENOUS

## 2023-03-06 MED ORDER — FENTANYL CITRATE (PF) 100 MCG/2ML IJ SOLN: 100.0000 ug | Freq: Once | INTRAMUSCULAR | Status: AC

## 2023-03-06 MED ORDER — HYDROMORPHONE HCL 1 MG/ML IJ SOLN
0.5000 mg | INTRAMUSCULAR | Status: DC | PRN
  Administered 2023-03-06 – 2023-03-09 (×11): 1 mg via INTRAVENOUS
  Filled 2023-03-06 (×11): qty 1

## 2023-03-06 MED ORDER — ACETAMINOPHEN 325 MG PO TABS: 325.0000 mg | ORAL_TABLET | Freq: Four times a day (QID) | ORAL | Status: DC | PRN

## 2023-03-06 MED ORDER — ONDANSETRON HCL 4 MG/2ML IJ SOLN: 4.0000 mg | Freq: Four times a day (QID) | INTRAMUSCULAR | Status: DC | PRN

## 2023-03-06 MED ORDER — OXYCODONE HCL 5 MG PO TABS
5.0000 mg | ORAL_TABLET | ORAL | Status: DC | PRN
  Administered 2023-03-08: 10 mg via ORAL
  Filled 2023-03-06: qty 2

## 2023-03-06 MED ORDER — KETOROLAC TROMETHAMINE 15 MG/ML IJ SOLN
7.5000 mg | Freq: Four times a day (QID) | INTRAMUSCULAR | Status: AC
  Administered 2023-03-06 – 2023-03-07 (×3): 7.5 mg via INTRAVENOUS
  Filled 2023-03-06 (×3): qty 1

## 2023-03-06 MED ORDER — GLYCOPYRROLATE PF 0.2 MG/ML IJ SOSY
PREFILLED_SYRINGE | INTRAMUSCULAR | Status: AC
  Filled 2023-03-06: qty 1

## 2023-03-06 MED ORDER — FENTANYL CITRATE (PF) 250 MCG/5ML IJ SOLN
INTRAMUSCULAR | Status: AC
  Filled 2023-03-06: qty 5

## 2023-03-06 MED ORDER — METOCLOPRAMIDE HCL 5 MG PO TABS: 5.0000 mg | ORAL_TABLET | Freq: Three times a day (TID) | ORAL | Status: DC | PRN

## 2023-03-06 MED ORDER — BUPIVACAINE-EPINEPHRINE (PF) 0.25% -1:200000 IJ SOLN
INTRAMUSCULAR | Status: DC | PRN
  Administered 2023-03-06: 30 mL

## 2023-03-06 MED ORDER — ACETAMINOPHEN 500 MG PO TABS
1000.0000 mg | ORAL_TABLET | Freq: Once | ORAL | Status: AC
  Administered 2023-03-06: 1000 mg via ORAL
  Filled 2023-03-06: qty 2

## 2023-03-06 MED ORDER — ALBUTEROL SULFATE HFA 108 (90 BASE) MCG/ACT IN AERS: 1.0000 | INHALATION_SPRAY | Freq: Four times a day (QID) | RESPIRATORY_TRACT | Status: DC | PRN

## 2023-03-06 MED ORDER — DEXAMETHASONE SODIUM PHOSPHATE 10 MG/ML IJ SOLN
INTRAMUSCULAR | Status: AC
  Filled 2023-03-06: qty 1

## 2023-03-06 MED ORDER — HYDROXYZINE HCL 25 MG PO TABS
25.0000 mg | ORAL_TABLET | Freq: Three times a day (TID) | ORAL | Status: DC
  Administered 2023-03-06 – 2023-03-09 (×8): 25 mg via ORAL
  Filled 2023-03-06 (×8): qty 1

## 2023-03-06 MED ORDER — LIDOCAINE 2% (20 MG/ML) 5 ML SYRINGE
INTRAMUSCULAR | Status: DC | PRN
  Administered 2023-03-06: 40 mg via INTRAVENOUS

## 2023-03-06 MED ORDER — OXYCODONE HCL 5 MG PO TABS: 5.0000 mg | ORAL_TABLET | Freq: Once | ORAL | Status: DC | PRN

## 2023-03-06 MED ORDER — SODIUM CHLORIDE 0.9 % IR SOLN
Status: DC | PRN
  Administered 2023-03-06: 1000 mL

## 2023-03-06 MED ORDER — ASPIRIN 81 MG PO CHEW
81.0000 mg | CHEWABLE_TABLET | Freq: Two times a day (BID) | ORAL | Status: DC
  Administered 2023-03-06 – 2023-03-09 (×6): 81 mg via ORAL
  Filled 2023-03-06 (×6): qty 1

## 2023-03-06 MED ORDER — FENTANYL CITRATE (PF) 100 MCG/2ML IJ SOLN
INTRAMUSCULAR | Status: AC
  Administered 2023-03-06: 100 ug via INTRAVENOUS
  Filled 2023-03-06: qty 2

## 2023-03-06 MED ORDER — TIZANIDINE HCL 4 MG PO TABS
4.0000 mg | ORAL_TABLET | Freq: Four times a day (QID) | ORAL | Status: DC | PRN
  Administered 2023-03-06 – 2023-03-09 (×3): 4 mg via ORAL
  Filled 2023-03-06 (×3): qty 1

## 2023-03-06 MED ORDER — ALBUTEROL SULFATE (2.5 MG/3ML) 0.083% IN NEBU: 2.5000 mg | INHALATION_SOLUTION | Freq: Four times a day (QID) | RESPIRATORY_TRACT | Status: DC | PRN

## 2023-03-06 MED ORDER — ORAL CARE MOUTH RINSE: 15.0000 mL | Freq: Once | OROMUCOSAL | Status: AC

## 2023-03-06 MED ORDER — MENTHOL 3 MG MT LOZG
1.0000 | LOZENGE | OROMUCOSAL | Status: DC | PRN
  Filled 2023-03-06: qty 9

## 2023-03-06 MED ORDER — BUPIVACAINE-EPINEPHRINE (PF) 0.5% -1:200000 IJ SOLN
INTRAMUSCULAR | Status: AC
  Filled 2023-03-06: qty 30

## 2023-03-06 MED ORDER — PANTOPRAZOLE SODIUM 40 MG PO TBEC
40.0000 mg | DELAYED_RELEASE_TABLET | Freq: Every day | ORAL | Status: DC
  Administered 2023-03-06 – 2023-03-09 (×4): 40 mg via ORAL
  Filled 2023-03-06 (×4): qty 1

## 2023-03-06 MED ORDER — CHLORHEXIDINE GLUCONATE 0.12 % MT SOLN
15.0000 mL | Freq: Once | OROMUCOSAL | Status: AC
  Administered 2023-03-06: 15 mL via OROMUCOSAL
  Filled 2023-03-06: qty 15

## 2023-03-06 MED ORDER — PHENOL 1.4 % MT LIQD: 1.0000 | OROMUCOSAL | Status: DC | PRN

## 2023-03-06 MED ORDER — OXYCODONE HCL 5 MG/5ML PO SOLN: 5.0000 mg | Freq: Once | ORAL | Status: DC | PRN

## 2023-03-06 MED ORDER — ONDANSETRON HCL 4 MG PO TABS: 4.0000 mg | ORAL_TABLET | Freq: Four times a day (QID) | ORAL | Status: DC | PRN

## 2023-03-06 MED ORDER — PROPOFOL 10 MG/ML IV BOLUS
INTRAVENOUS | Status: DC | PRN
  Administered 2023-03-06: 30 mg via INTRAVENOUS

## 2023-03-06 MED ORDER — TOPIRAMATE 100 MG PO TABS
100.0000 mg | ORAL_TABLET | Freq: Every day | ORAL | Status: DC
  Administered 2023-03-06 – 2023-03-08 (×3): 100 mg via ORAL
  Filled 2023-03-06 (×3): qty 1

## 2023-03-06 MED ORDER — AMISULPRIDE (ANTIEMETIC) 5 MG/2ML IV SOLN: 10.0000 mg | Freq: Once | INTRAVENOUS | Status: DC | PRN

## 2023-03-06 MED ORDER — PHENYLEPHRINE 80 MCG/ML (10ML) SYRINGE FOR IV PUSH (FOR BLOOD PRESSURE SUPPORT)
PREFILLED_SYRINGE | INTRAVENOUS | Status: DC | PRN
  Administered 2023-03-06 (×2): 160 ug via INTRAVENOUS
  Administered 2023-03-06 (×6): 80 ug via INTRAVENOUS

## 2023-03-06 MED ORDER — BUPIVACAINE IN DEXTROSE 0.75-8.25 % IT SOLN
INTRATHECAL | Status: DC | PRN
  Administered 2023-03-06: 1.6 mL via INTRATHECAL

## 2023-03-06 MED ORDER — PHENYLEPHRINE 80 MCG/ML (10ML) SYRINGE FOR IV PUSH (FOR BLOOD PRESSURE SUPPORT)
PREFILLED_SYRINGE | INTRAVENOUS | Status: AC
  Filled 2023-03-06: qty 10

## 2023-03-06 MED ORDER — MIDAZOLAM HCL 2 MG/2ML IJ SOLN
INTRAMUSCULAR | Status: AC
  Administered 2023-03-06: 2 mg via INTRAVENOUS
  Filled 2023-03-06: qty 2

## 2023-03-06 MED ORDER — SODIUM CHLORIDE 0.9 % IV SOLN: INTRAVENOUS | Status: AC

## 2023-03-06 MED ORDER — BUPIVACAINE-EPINEPHRINE (PF) 0.25% -1:200000 IJ SOLN
INTRAMUSCULAR | Status: AC
  Filled 2023-03-06: qty 30

## 2023-03-06 MED ORDER — OXYCODONE HCL 5 MG PO TABS
10.0000 mg | ORAL_TABLET | ORAL | Status: DC | PRN
  Administered 2023-03-06: 10 mg via ORAL
  Administered 2023-03-06 – 2023-03-09 (×6): 15 mg via ORAL
  Filled 2023-03-06 (×6): qty 3
  Filled 2023-03-06: qty 2

## 2023-03-06 MED ORDER — METOCLOPRAMIDE HCL 5 MG/ML IJ SOLN: 5.0000 mg | Freq: Three times a day (TID) | INTRAMUSCULAR | Status: DC | PRN

## 2023-03-06 MED ORDER — QUETIAPINE FUMARATE 200 MG PO TABS
400.0000 mg | ORAL_TABLET | Freq: Every day | ORAL | Status: DC
  Filled 2023-03-06 (×3): qty 2
  Filled 2023-03-06 (×2): qty 4

## 2023-03-06 MED ORDER — CEFAZOLIN SODIUM-DEXTROSE 2-4 GM/100ML-% IV SOLN
2.0000 g | Freq: Four times a day (QID) | INTRAVENOUS | Status: AC
  Administered 2023-03-06 (×2): 2 g via INTRAVENOUS
  Filled 2023-03-06 (×2): qty 100

## 2023-03-06 MED ORDER — DIVALPROEX SODIUM 500 MG PO DR TAB
500.0000 mg | DELAYED_RELEASE_TABLET | Freq: Three times a day (TID) | ORAL | Status: DC
  Administered 2023-03-06 – 2023-03-09 (×8): 500 mg via ORAL
  Filled 2023-03-06 (×9): qty 1

## 2023-03-06 MED ORDER — DEXAMETHASONE SODIUM PHOSPHATE 10 MG/ML IJ SOLN
INTRAMUSCULAR | Status: DC | PRN
  Administered 2023-03-06: 5 mg via INTRAVENOUS

## 2023-03-06 MED ORDER — GABAPENTIN 300 MG PO CAPS
600.0000 mg | ORAL_CAPSULE | Freq: Every day | ORAL | Status: DC
  Administered 2023-03-06 – 2023-03-08 (×3): 600 mg via ORAL
  Filled 2023-03-06 (×3): qty 2

## 2023-03-06 MED ORDER — CEFAZOLIN SODIUM-DEXTROSE 2-4 GM/100ML-% IV SOLN
2.0000 g | INTRAVENOUS | Status: AC
  Administered 2023-03-06: 2 g via INTRAVENOUS
  Filled 2023-03-06: qty 100

## 2023-03-06 MED ORDER — MIDAZOLAM HCL 2 MG/2ML IJ SOLN: 2.0000 mg | Freq: Once | INTRAMUSCULAR | Status: AC

## 2023-03-06 MED ORDER — FENTANYL CITRATE (PF) 100 MCG/2ML IJ SOLN: 25.0000 ug | INTRAMUSCULAR | Status: DC | PRN

## 2023-03-06 MED ORDER — DOCUSATE SODIUM 100 MG PO CAPS
100.0000 mg | ORAL_CAPSULE | Freq: Two times a day (BID) | ORAL | Status: DC
  Administered 2023-03-06 – 2023-03-09 (×6): 100 mg via ORAL
  Filled 2023-03-06 (×6): qty 1

## 2023-03-06 SURGICAL SUPPLY — 61 items
BAG COUNTER SPONGE SURGICOUNT (BAG) ×1 IMPLANT
BANDAGE ESMARK 6X9 LF (GAUZE/BANDAGES/DRESSINGS) ×1 IMPLANT
BLADE SAG 18X100X1.27 (BLADE) ×1 IMPLANT
BNDG ELASTIC 4INX 5YD STR LF (GAUZE/BANDAGES/DRESSINGS) IMPLANT
BNDG ELASTIC 6INX 5YD STR LF (GAUZE/BANDAGES/DRESSINGS) ×2 IMPLANT
BNDG ESMARK 6X9 LF (GAUZE/BANDAGES/DRESSINGS) ×1 IMPLANT
BOWL SMART MIX CTS (DISPOSABLE) IMPLANT
COMP FEM STD PS KNEE 7 RT (Joint) ×1 IMPLANT
COMP PATELLA 3 PEG 29X9 (Joint) ×1 IMPLANT
COMPONENT FEM STD PS KNEE 7 RT (Joint) IMPLANT
COMPONENT PATELLA 3 PEG 29X9 (Joint) IMPLANT
COOLER ICEMAN CLASSIC (MISCELLANEOUS) ×1 IMPLANT
COVER SURGICAL LIGHT HANDLE (MISCELLANEOUS) ×1 IMPLANT
CUFF TOURN SGL QUICK 42 (TOURNIQUET CUFF) IMPLANT
CUFF TRNQT CYL 34X4.125X (TOURNIQUET CUFF) ×1 IMPLANT
DRAPE EXTREMITY T 121X128X90 (DISPOSABLE) ×1 IMPLANT
DRAPE HALF SHEET 40X57 (DRAPES) ×1 IMPLANT
DRAPE U-SHAPE 47X51 STRL (DRAPES) ×1 IMPLANT
DURAPREP 26ML APPLICATOR (WOUND CARE) ×1 IMPLANT
ELECT CAUTERY BLADE 6.4 (BLADE) ×1 IMPLANT
ELECT REM PT RETURN 9FT ADLT (ELECTROSURGICAL) ×1 IMPLANT
ELECTRODE REM PT RTRN 9FT ADLT (ELECTROSURGICAL) ×1 IMPLANT
FACESHIELD WRAPAROUND (MASK) ×3 IMPLANT
FACESHIELD WRAPAROUND OR TEAM (MASK) ×2 IMPLANT
GAUZE PAD ABD 8X10 STRL (GAUZE/BANDAGES/DRESSINGS) ×1 IMPLANT
GAUZE SPONGE 4X4 12PLY STRL (GAUZE/BANDAGES/DRESSINGS) ×1 IMPLANT
GAUZE XEROFORM 1X8 LF (GAUZE/BANDAGES/DRESSINGS) ×1 IMPLANT
GAUZE XEROFORM 5X9 LF (GAUZE/BANDAGES/DRESSINGS) IMPLANT
GLOVE BIOGEL PI IND STRL 8 (GLOVE) ×2 IMPLANT
GLOVE ORTHO TXT STRL SZ7.5 (GLOVE) ×1 IMPLANT
GLOVE SURG ORTHO 8.0 STRL STRW (GLOVE) ×1 IMPLANT
GOWN STRL REUS W/ TWL LRG LVL3 (GOWN DISPOSABLE) IMPLANT
GOWN STRL REUS W/ TWL XL LVL3 (GOWN DISPOSABLE) ×2 IMPLANT
IMMOBILIZER KNEE 22 UNIV (SOFTGOODS) ×1 IMPLANT
INSERT TIB ASF VIV SZ 6-7 12H (Insert) IMPLANT
IV NS 1000ML BAXH (IV SOLUTION) ×1 IMPLANT
KIT BASIN OR (CUSTOM PROCEDURE TRAY) ×1 IMPLANT
KIT TURNOVER KIT B (KITS) ×1 IMPLANT
MANIFOLD NEPTUNE II (INSTRUMENTS) ×1 IMPLANT
NDL 18GX1X1/2 (RX/OR ONLY) (NEEDLE) IMPLANT
NEEDLE 18GX1X1/2 (RX/OR ONLY) (NEEDLE) IMPLANT
NS IRRIG 1000ML POUR BTL (IV SOLUTION) ×1 IMPLANT
PACK TOTAL JOINT (CUSTOM PROCEDURE TRAY) ×1 IMPLANT
PAD ARMBOARD 7.5X6 YLW CONV (MISCELLANEOUS) ×1 IMPLANT
PAD COLD SHLDR WRAP-ON (PAD) ×1 IMPLANT
PADDING CAST ABS COTTON 4X4 ST (CAST SUPPLIES) IMPLANT
PADDING CAST COTTON 6X4 STRL (CAST SUPPLIES) ×1 IMPLANT
PIN DRILL HDLS TROCAR 75 4PK (PIN) IMPLANT
SCREW FEMALE HEX FIX 25X2.5 (ORTHOPEDIC DISPOSABLE SUPPLIES) IMPLANT
SET HNDPC FAN SPRY TIP SCT (DISPOSABLE) ×1 IMPLANT
SET PAD KNEE POSITIONER (MISCELLANEOUS) ×1 IMPLANT
STAPLER VISISTAT 35W (STAPLE) ×1 IMPLANT
STEM TIB PS KNEE D 0D RT (Stem) IMPLANT
SUCTION TUBE FRAZIER 10FR DISP (SUCTIONS) ×1 IMPLANT
SUT VIC AB 0 CT1 27XBRD ANBCTR (SUTURE) ×1 IMPLANT
SUT VIC AB 1 CT1 27XBRD ANBCTR (SUTURE) ×2 IMPLANT
SUT VIC AB 2-0 CT1 TAPERPNT 27 (SUTURE) ×2 IMPLANT
SYR 50ML LL SCALE MARK (SYRINGE) IMPLANT
TOWEL GREEN STERILE (TOWEL DISPOSABLE) ×1 IMPLANT
TOWEL GREEN STERILE FF (TOWEL DISPOSABLE) ×1 IMPLANT
TRAY CATH INTERMITTENT SS 16FR (CATHETERS) IMPLANT

## 2023-03-06 NOTE — Evaluation (Signed)
 Physical Therapy Evaluation Patient Details Name: Holly Mendez MRN: [id number] DOB: 03-Jul-1975 Today's Date: 03/06/2023  History of Present Illness  48 y.o. female presents to [facility] hospital on 03/06/2023 for elective R TKA. PMH includes bipolar affective disorder, L eye blindness, asthma, spondylosis.  Clinical Impression  Pt presents to PT mobilizing well s/p R TKA. Pt is able to transfer and ambulate with support of RW for household distances. PT will follow up tomorrow for education on HEP and to progress mobility further.        If plan is discharge home, recommend the following: A little help with bathing/dressing/bathroom;Assistance with cooking/housework;Assist for transportation;Help with stairs or ramp for entrance   Can travel by private vehicle        Equipment Recommendations Rolling walker (2 wheels)  Recommendations for Other Services       Functional Status Assessment Patient has had a recent decline in their functional status and demonstrates the ability to make significant improvements in function in a reasonable and predictable amount of time.     Precautions / Restrictions Precautions Precautions: Fall;Knee Precaution Booklet Issued: Yes (comment) Recall of Precautions/Restrictions: Intact Restrictions Weight Bearing Restrictions Per Provider Order: Yes RLE Weight Bearing Per Provider Order: Weight bearing as tolerated      Mobility  Bed Mobility Overal bed mobility: Needs Assistance Bed Mobility: Supine to Sit, Sit to Supine     Supine to sit: Contact guard Sit to supine: Min assist        Transfers Overall transfer level: Needs assistance Equipment used: Rolling walker (2 wheels) Transfers: Sit to/from Stand Sit to Stand: Contact guard assist                Ambulation/Gait Ambulation/Gait assistance: Contact guard assist Gait Distance (Feet): 150 Feet Assistive device: Rolling walker (2 wheels) Gait Pattern/deviations:  Step-through pattern Gait velocity: reduced Gait velocity interpretation: <1.8 ft/sec, indicate of risk for recurrent falls   General Gait Details: slowed step-through gait  Stairs            Wheelchair Mobility     Tilt Bed    Modified Rankin (Stroke Patients Only)       Balance Overall balance assessment: Needs assistance Sitting-balance support: No upper extremity supported, Feet supported Sitting balance-Leahy Scale: Good     Standing balance support: Bilateral upper extremity supported, Reliant on assistive device for balance Standing balance-Leahy Scale: Poor                               Pertinent Vitals/Pain Pain Assessment Pain Assessment: 0-10 Pain Score: 6  Pain Location: R knee Pain Descriptors / Indicators: Aching Pain Intervention(s): RN gave pain meds during session    Home Living Family/patient expects to be discharged to:: Private residence Living Arrangements: Alone Available Help at Discharge: Other (Comment);Available 24 hours/day (significant other) Type of Home: Apartment Home Access: Level entry;Elevator       Home Layout: One level Home Equipment: Rollator (4 wheels);Cane - quad      Prior Function Prior Level of Function : Independent/Modified Independent             Mobility Comments: ambulatory without DME recently       Extremity/Trunk Assessment   Upper Extremity Assessment Upper Extremity Assessment: Overall WFL for tasks assessed (pt with L wrist surgery in November)    Lower Extremity Assessment Lower Extremity Assessment: RLE deficits/detail RLE Deficits / Details: generalized post-op weakness,  ROM deficits as anticipated on POD 0 s/p TKA    Cervical / Trunk Assessment Cervical / Trunk Assessment: Normal  Communication   Communication Communication: No apparent difficulties    Cognition Arousal: Alert Behavior During Therapy: WFL for tasks assessed/performed   PT - Cognitive  impairments: No apparent impairments                         Following commands: Intact       Cueing Cueing Techniques: Verbal cues     General Comments General comments (skin integrity, edema, etc.): VSS on RA    Exercises     Assessment/Plan    PT Assessment Patient needs continued PT services  PT Problem List Decreased strength;Decreased range of motion;Decreased activity tolerance;Decreased balance;Decreased mobility;Decreased knowledge of use of DME;Pain       PT Treatment Interventions DME instruction;Gait training;Functional mobility training;Therapeutic activities;Therapeutic exercise;Balance training;Neuromuscular re-education;Patient/family education    PT Goals (Current goals can be found in the Care Plan section)  Acute Rehab PT Goals Patient Stated Goal: to return to independence PT Goal Formulation: With patient Time For Goal Achievement: 03/10/23 Potential to Achieve Goals: Good    Frequency 7X/week     Co-evaluation               AM-PAC PT "6 Clicks" Mobility  Outcome Measure Help needed turning from your back to your side while in a flat bed without using bedrails?: A Little Help needed moving from lying on your back to sitting on the side of a flat bed without using bedrails?: A Little Help needed moving to and from a bed to a chair (including a wheelchair)?: A Little Help needed standing up from a chair using your arms (e.g., wheelchair or bedside chair)?: A Little Help needed to walk in hospital room?: A Little Help needed climbing 3-5 steps with a railing? : Total 6 Click Score: 16    End of Session Equipment Utilized During Treatment: Gait belt Activity Tolerance: Patient tolerated treatment well Patient left: in bed;with call bell/phone within reach;with bed alarm set Nurse Communication: Mobility status PT Visit Diagnosis: Other abnormalities of gait and mobility (R26.89);Muscle weakness (generalized) (M62.81);Pain Pain -  Right/Left: Right Pain - part of body: Knee    Time: 1750-1810 PT Time Calculation (min) (ACUTE ONLY): 20 min   Charges:   PT Evaluation $PT Eval Low Complexity: 1 Low   PT General Charges $$ ACUTE PT VISIT: 1 Visit         Arlyss Gandy, PT, DPT Acute Rehabilitation Office [phone]   Arlyss Gandy 03/06/2023, 6:23 PM

## 2023-03-06 NOTE — Discharge Instructions (Signed)
 Per [facility] clinic policy, our goal is ensure optimal postoperative pain control with a multimodal pain management strategy. For all [facility] patients, our goal is to wean post-operative narcotic medications by 6 weeks post-operatively. If this is not possible due to utilization of pain medication prior to surgery, your [facility] doctor will support your acute post-operative pain control for the first 6 weeks postoperatively, with a plan to transition you back to your primary pain team following that. Cyndia Skeeters will work to ensure a Therapist, occupational.  INSTRUCTIONS AFTER JOINT REPLACEMENT   Remove items at home which could result in a fall. This includes throw rugs or furniture in walking pathways ICE to the affected joint every three hours while awake for 30 minutes at a time, for at least the first 3-5 days, and then as needed for pain and swelling.  Continue to use ice for pain and swelling. You may notice swelling that will progress down to the foot and ankle.  This is normal after surgery.  Elevate your leg when you are not up walking on it.   Continue to use the breathing machine you got in the hospital (incentive spirometer) which will help keep your temperature down.  It is common for your temperature to cycle up and down following surgery, especially at night when you are not up moving around and exerting yourself.  The breathing machine keeps your lungs expanded and your temperature down.   DIET:  As you were doing prior to hospitalization, we recommend a well-balanced diet.  DRESSING / WOUND CARE / SHOWERING  Keep the surgical dressing until follow up.  The dressing is water proof, so you can shower without any extra covering.  IF THE DRESSING FALLS OFF or the wound gets wet inside, change the dressing with sterile gauze.  Please use good hand washing techniques before changing the dressing.  Do not use any lotions or creams on the incision until instructed by your surgeon.     ACTIVITY  Increase activity slowly as tolerated, but follow the weight bearing instructions below.   No driving for 6 weeks or until further direction given by your physician.  You cannot drive while taking narcotics.  No lifting or carrying greater than 10 lbs. until further directed by your surgeon. Avoid periods of inactivity such as sitting longer than an hour when not asleep. This helps prevent blood clots.  You may return to work once you are authorized by your doctor.     WEIGHT BEARING   Weight bearing as tolerated with assist device (walker, cane, etc) as directed, use it as long as suggested by your surgeon or therapist, typically at least 4-6 weeks.   EXERCISES  Results after joint replacement surgery are often greatly improved when you follow the exercise, range of motion and muscle strengthening exercises prescribed by your doctor. Safety measures are also important to protect the joint from further injury. Any time any of these exercises cause you to have increased pain or swelling, decrease what you are doing until you are comfortable again and then slowly increase them. If you have problems or questions, call your caregiver or physical therapist for advice.   Rehabilitation is important following a joint replacement. After just a few days of immobilization, the muscles of the leg can become weakened and shrink (atrophy).  These exercises are designed to build up the tone and strength of the thigh and leg muscles and to improve motion. Often times heat used for twenty to thirty minutes before  working out will loosen up your tissues and help with improving the range of motion but do not use heat for the first two weeks following surgery (sometimes heat can increase post-operative swelling).   These exercises can be done on a training (exercise) mat, on the floor, on a table or on a bed. Use whatever works the best and is most comfortable for you.    Use music or television  while you are exercising so that the exercises are a pleasant break in your day. This will make your life better with the exercises acting as a break in your routine that you can look forward to.   Perform all exercises about fifteen times, three times per day or as directed.  You should exercise both the operative leg and the other leg as well.  Exercises include:   Quad Sets - Tighten up the muscle on the front of the thigh (Quad) and hold for 5-10 seconds.   Straight Leg Raises - With your knee straight (if you were given a brace, keep it on), lift the leg to 60 degrees, hold for 3 seconds, and slowly lower the leg.  Perform this exercise against resistance later as your leg gets stronger.  Leg Slides: Lying on your back, slowly slide your foot toward your buttocks, bending your knee up off the floor (only go as far as is comfortable). Then slowly slide your foot back down until your leg is flat on the floor again.  Angel Wings: Lying on your back spread your legs to the side as far apart as you can without causing discomfort.  Hamstring Strength:  Lying on your back, push your heel against the floor with your leg straight by tightening up the muscles of your buttocks.  Repeat, but this time bend your knee to a comfortable angle, and push your heel against the floor.  You may put a pillow under the heel to make it more comfortable if necessary.   A rehabilitation program following joint replacement surgery can speed recovery and prevent re-injury in the future due to weakened muscles. Contact your doctor or a physical therapist for more information on knee rehabilitation.    CONSTIPATION  Constipation is defined medically as fewer than three stools per week and severe constipation as less than one stool per week.  Even if you have a regular bowel pattern at home, your normal regimen is likely to be disrupted due to multiple reasons following surgery.  Combination of anesthesia, postoperative  narcotics, change in appetite and fluid intake all can affect your bowels.   YOU MUST use at least one of the following options; they are listed in order of increasing strength to get the job done.  They are all available over the counter, and you may need to use some, POSSIBLY even all of these options:    Drink plenty of fluids (prune juice may be helpful) and high fiber foods Colace 100 mg by mouth twice a day  Senokot for constipation as directed and as needed Dulcolax (bisacodyl), take with full glass of water  Miralax (polyethylene glycol) once or twice a day as needed.  If you have tried all these things and are unable to have a bowel movement in the first 3-4 days after surgery call either your surgeon or your primary doctor.    If you experience loose stools or diarrhea, hold the medications until you stool forms back up.  If your symptoms do not get better within 1 week  or if they get worse, check with your doctor.  If you experience "the worst abdominal pain ever" or develop nausea or vomiting, please contact the office immediately for further recommendations for treatment.   ITCHING:  If you experience itching with your medications, try taking only a single pain pill, or even half a pain pill at a time.  You can also use Benadryl over the counter for itching or also to help with sleep.   TED HOSE STOCKINGS:  Use stockings on both legs until for at least 2 weeks or as directed by physician office. They may be removed at night for sleeping.  MEDICATIONS:  See your medication summary on the "After Visit Summary" that nursing will review with you.  You may have some home medications which will be placed on hold until you complete the course of blood thinner medication.  It is important for you to complete the blood thinner medication as prescribed.  PRECAUTIONS:  If you experience chest pain or shortness of breath - call 911 immediately for transfer to the hospital emergency department.    If you develop a fever greater that 101 F, purulent drainage from wound, increased redness or drainage from wound, foul odor from the wound/dressing, or calf pain - CONTACT YOUR SURGEON.                                                   FOLLOW-UP APPOINTMENTS:  If you do not already have a post-op appointment, please call the office for an appointment to be seen by your surgeon.  Guidelines for how soon to be seen are listed in your "After Visit Summary", but are typically between 1-4 weeks after surgery.  OTHER INSTRUCTIONS:   Knee Replacement:  Do not place pillow under knee, focus on keeping the knee straight while resting. CPM instructions: 0-90 degrees, 2 hours in the morning, 2 hours in the afternoon, and 2 hours in the evening. Place foam block, curve side up under heel at all times except when in CPM or when walking.  DO NOT modify, tear, cut, or change the foam block in any way.  POST-OPERATIVE OPIOID TAPER INSTRUCTIONS: It is important to wean off of your opioid medication as soon as possible. If you do not need pain medication after your surgery it is ok to stop day one. Opioids include: Codeine, Hydrocodone(Norco, Vicodin), Oxycodone(Percocet, oxycontin) and hydromorphone amongst others.  Long term and even short term use of opiods can cause: Increased pain response Dependence Constipation Depression Respiratory depression And more.  Withdrawal symptoms can include Flu like symptoms Nausea, vomiting And more Techniques to manage these symptoms Hydrate well Eat regular healthy meals Stay active Use relaxation techniques(deep breathing, meditating, yoga) Do Not substitute Alcohol to help with tapering If you have been on opioids for less than two weeks and do not have pain than it is ok to stop all together.  Plan to wean off of opioids This plan should start within one week post op of your joint replacement. Maintain the same interval or time between taking each dose  and first decrease the dose.  Cut the total daily intake of opioids by one tablet each day Next start to increase the time between doses. The last dose that should be eliminated is the evening dose.   MAKE SURE YOU:  Understand these instructions.  Get help right away if you are not doing well or get worse.    Thank you for letting us be a part of your medical care team.  It is a privilege we respect greatly.  We hope these instructions will help you stay on track for a fast and full recovery!      Dental Antibiotics:  In most cases prophylactic antibiotics for Dental procdeures after total joint surgery are not necessary.  Exceptions are as follows:  1. History of prior total joint infection  2. Severely immunocompromised (Organ Transplant, cancer chemotherapy, Rheumatoid biologic meds such as Humera)  3. Poorly controlled diabetes (A1C &gt; 8.0, blood glucose over 200)  If you have one of these conditions, contact your surgeon for an antibiotic prescription, prior to your dental procedure.

## 2023-03-06 NOTE — Op Note (Signed)
 Operative Note  Date of operation: 03/06/2023 Preoperative diagnosis: Right knee primary osteoarthritis Postoperative diagnosis: Same  Procedure: Right press-fit total knee arthroplasty  Implants: Biomet/Zimmer persona press-fit knee system Implant Name Type Inv. Item Serial No. Manufacturer Lot No. LRB No. Used Action  COMP PATELLA 3 PEG 29X9 - [id number] Joint COMP PATELLA 3 PEG 29X9  ZIMMER [employer]ON(ORTH,TRAU,BIO,SG) [id number] Right 1 Implanted  INSERT TIB ASF VIV SZ 6-7 12H - [id number] Insert INSERT TIB ASF VIV SZ 6-7 12H  ZIMMER RECON(ORTH,TRAU,BIO,SG) [id number] Right 1 Implanted  COMP FEM STD PS KNEE 7 RT - [id number] Joint COMP FEM STD PS KNEE 7 RT  ZIMMER RECON(ORTH,TRAU,BIO,SG) [id number] Right 1 Implanted  STEM TIB PS KNEE D 0D RT - [id number] Stem STEM TIB PS KNEE D 0D RT  ZIMMER [employer]ON(ORTH,TRAU,BIO,SG) [id number] Right 1 Implanted   Surgeon: Vanita Panda. Magnus Ivan, MD Assistant: Rexene Edison, PA-C  Anesthesia: #1 right lower extremity adductor canal block, #2 spinal, over 3 local Tourniquet time: Under 1 hour EBL: Less than 100 cc Antibiotics: IV Ancef Complications: None  Indications: The patient is a 48 year old female with debilitating arthritis involving her right knee.  She has tried and failed conservative treatment for well over a year now.  At this point she is exhausted all conservative treatment options.  Her right knee pain is daily and it is severe.  It is detrimentally affecting her mobility, her quality of life and her actives daily living to the point she does wish to proceed with total knee arthroplasty surgery.  She understands that she is morbidly obese and has a heightened risk of acute blood loss anemia, nerve or vessel injury, fracture, infection, DVT and implant failure.  She understands that our goals are hopefully decreased pain, improved mobility and improved quality of life.  Procedure description: After informed consent was obtained and appropriate right knee  was marked, an adductor canal block was obtained of the right lower extremity holding room.  The patient was then brought to the operating room and set up on the operating table where spinal anesthesia was obtained.  She was laid in supine position on the operating table and a Foley catheter was placed.  A nonsterile tourniquet placed around her upper right thigh and her right thigh, knee, leg and ankle and foot were prepped and draped with DuraPrep and sterile drapes.  This included a sterile stockinette.  A timeout was called and she was identified as the correct patient and the correct right knee.  We then used the Esmarch to wrap out the leg and the tourniquet was plated 300 mm pressure.  With the knee extended a direct midline incision was made over patella and carried proximally distally.  Dissection was carried down to the knee joint and a medial parapatellar arthrotomy was made finding a moderate joint effusion.  We then flexed the knee and removed osteophytes in all 3 compartments as well as remnants of the ACL and medial lateral meniscus.  We then used an extramedullary cutting guide for making her proximal tibia cut correction for varus and valgus and a 7 degree slope.  We made this cut to take 2 mm off the low side.  We then went for distal femur cut using an intramedullary based cutting guide for a right knee at 5 degrees externally rotated for a 10 mm distal femoral cut.  We brought the knee back down to full extension and with a 10 mm extension block at achieve full extension.  We then backed the femur  and put a femoral sizing guide based off the epicondylar axis.  Based off of this we chose a size 7 femur.  We put a 4-in-1 cutting block for a size 7 femur and made our anterior and posterior cuts followed our chamfer cuts.  We then back to the tibia and chose a size D tibial tray for a right knee with coverage over the tibial plateau setting the rotation of the tibial tubercle and the femur.  We did  our drill hole and keel punch over this and found excellent quality bone for press-fit implants which is appropriate given her young age of 48.  We then trialed our size D right tibia combined with our size 7 right CR standard femur.  We trialed a 10 mm right fixed-bearing 5 and insert which was medial congruent and went up to a 12 mm insert and we are pleased with the thickness that the 12 mm insert was in terms of giving [name] still range of motion with stability on exam.  We then made our patella cut and drilled 3 holes for a size 29 patella button.  Again with all trial instrumentation the knee we put her through range of motion we are pleased with range of motion and stability.  We then removed all trial components from the knee and irrigated the knee with normal saline solution.  We then placed Marcaine with epinephrine around the arthrotomy.  Next with the knee in a flexed position we press-fit our Biomet/Zimmer persona tibial tray for right knee size D followed by press fitting our size 7 right CR standard femur.  We placed our 12 mm thickness medial congruent right polythene insert and press-fit our size 29 patella button.  We are pleased with range of motion and stability after this.  We then let the tourniquet down and hemostasis was obtained with electrocautery.  The arthrotomy was closed with interrupted #1 Vicryl suture followed by 0 Vicryl close the deep tissue and 2-0 Vicryl to close the subcutaneous tissue.  The skin was closed with staples.  Well-padded sterile dressing was applied.  The patient was taken the recovery room.  Rexene Edison, PA-C did assist during the entire case and beginning to end and his assistance was crucial and medically necessary for soft tissue management and retraction, helping guide implant placement and a layered closure of the wound.

## 2023-03-06 NOTE — Anesthesia Procedure Notes (Signed)
 Spinal  Patient location during procedure: OR Start time: 03/06/2023 12:16 PM End time: 03/06/2023 12:19 PM Reason for block: surgical anesthesia Staffing Performed: anesthesiologist  Anesthesiologist: Linton Rump, MD Performed by: Linton Rump, MD Authorized by: Linton Rump, MD   Preanesthetic Checklist Completed: patient identified, IV checked, site marked, risks and benefits discussed, surgical consent, monitors and equipment checked, pre-op evaluation and timeout performed Spinal Block Patient position: sitting Prep: DuraPrep Patient monitoring: blood pressure and continuous pulse ox Approach: midline Location: L3-4 Injection technique: single-shot Needle Needle type: Pencan  Needle gauge: 24 G Needle length: 9 cm Additional Notes Risks and benefits of neuraxial anesthesia including, but not limited to, infection, bleeding, local anesthetic toxicity, headache, hypotension, back pain, block failure, etc. were discussed with the patient. The patient expressed understanding and consented to the procedure. I confirmed that the patient has no bleeding disorders and is not taking blood thinners. I confirmed the patient's last platelet count with the nurse. Monitors were applied. A time-out was performed immediately prior to the procedure. Sterile technique was used throughout the whole procedure.   1 attempt(s)

## 2023-03-06 NOTE — Anesthesia Procedure Notes (Signed)
 Anesthesia Regional Block: Adductor canal block   Pre-Anesthetic Checklist: , timeout performed,  Correct Patient, Correct Site, Correct Laterality,  Correct Procedure, Correct Position, site marked,  Risks and benefits discussed,  Surgical consent,  Pre-op evaluation,  At surgeon's request and post-op pain management  Laterality: Right  Prep: chloraprep       Needles:  Injection technique: Single-shot  Needle Type: Echogenic Stimulator Needle     Needle Length: 9cm  Needle Gauge: 21     Additional Needles:   Procedures:,,,, ultrasound used (permanent image in chart),,    Narrative:  Start time: 03/06/2023 11:13 AM End time: 03/06/2023 11:16 AM Injection made incrementally with aspirations every 5 mL.  Performed by: Personally  Anesthesiologist: Linton Rump, MD  Additional Notes: Discussed risks and benefits of nerve block including, but not limited to, prolonged and/or permanent nerve injury involving sensory and/or motor function. Monitors were applied and a time-out was performed. The nerve and associated structures were visualized under ultrasound guidance. After negative aspiration, local anesthetic was slowly injected around the nerve. There was no evidence of high pressure during the procedure. There were no paresthesias. VSS remained stable and the patient tolerated the procedure well.

## 2023-03-06 NOTE — Transfer of Care (Signed)
 Immediate Anesthesia Transfer of Care Note  Patient: Holly Mendez  Procedure(s) Performed: RIGHT TOTAL KNEE ARTHROPLASTY (Right: Knee)  Patient Location: PACU  Anesthesia Type:Spinal and MAC combined with regional for post-op pain  Level of Consciousness: awake, alert , and oriented  Airway & Oxygen Therapy: Patient Spontanous Breathing and Patient connected to face mask oxygen  Post-op Assessment: Report given to RN and Post -op Vital signs reviewed and stable  Post vital signs: Reviewed and stable  Last Vitals:  Vitals Value Taken Time  BP 103/67 03/06/23 1421  Temp    Pulse 71 03/06/23 1426  Resp 15 03/06/23 1426  SpO2 90 % 03/06/23 1426  Vitals shown include unfiled device data.  Last Pain:  Vitals:   03/06/23 1120  TempSrc:   PainSc: 4          Complications: No notable events documented.

## 2023-03-06 NOTE — Plan of Care (Signed)
   Problem: Activity: Goal: Risk for activity intolerance will decrease Outcome: Progressing   Problem: Coping: Goal: Level of anxiety will decrease Outcome: Progressing   Problem: Pain Managment: Goal: General experience of comfort will improve and/or be controlled Outcome: Progressing

## 2023-03-06 NOTE — Interval H&P Note (Signed)
 History and Physical Interval Note: The patient understands that she is here today for a right total knee replacement to treat her significant right knee pain and arthritis.  There has been no acute or interval change in her medical status.  The risks and benefits of surgery have been discussed in detail and informed consent has been obtained.  The right operative knee has been marked.  03/06/2023 10:39 AM  Holly Mendez  has presented today for surgery, with the diagnosis of right knee osteoarthritis.  The various methods of treatment have been discussed with the patient and family. After consideration of risks, benefits and other options for treatment, the patient has consented to  Procedure(s): RIGHT TOTAL KNEE ARTHROPLASTY (Right) as a surgical intervention.  The patient's history has been reviewed, patient examined, no change in status, stable for surgery.  I have reviewed the patient's chart and labs.  Questions were answered to the patient's satisfaction.     Kathryne Hitch

## 2023-03-06 NOTE — Anesthesia Postprocedure Evaluation (Signed)
 Anesthesia Post Note  Patient: SHANENA PELLEGRINO  Procedure(s) Performed: RIGHT TOTAL KNEE ARTHROPLASTY (Right: Knee)     Patient location during evaluation: PACU Anesthesia Type: MAC and Spinal Level of consciousness: awake Pain management: pain level controlled Vital Signs Assessment: post-procedure vital signs reviewed and stable Respiratory status: spontaneous breathing, respiratory function stable and nonlabored ventilation Cardiovascular status: blood pressure returned to baseline and stable Postop Assessment: no headache, no backache and no apparent nausea or vomiting Anesthetic complications: no   No notable events documented.  Last Vitals:  Vitals:   03/06/23 1530 03/06/23 1545  BP: 132/82 133/82  Pulse: 60 63  Resp: 13 15  Temp: 36.4 C   SpO2: 98% 97%    Last Pain:  Vitals:   03/06/23 1515  TempSrc:   PainSc: 0-No pain                 Linton Rump

## 2023-03-07 ENCOUNTER — Telehealth: Payer: Self-pay | Admitting: Orthopaedic Surgery

## 2023-03-07 ENCOUNTER — Encounter (HOSPITAL_COMMUNITY): Payer: Self-pay | Admitting: Orthopaedic Surgery

## 2023-03-07 DIAGNOSIS — M1711 Unilateral primary osteoarthritis, right knee: Secondary | ICD-10-CM | POA: Diagnosis not present

## 2023-03-07 LAB — CBC
HCT: 31.4 % — ABNORMAL LOW (ref 36.0–46.0)
Hemoglobin: 10.7 g/dL — ABNORMAL LOW (ref 12.0–15.0)
MCH: 29.3 pg (ref 26.0–34.0)
MCHC: 34.1 g/dL (ref 30.0–36.0)
MCV: 86 fL (ref 80.0–100.0)
Platelets: 201 10*3/uL (ref 150–400)
RBC: 3.65 MIL/uL — ABNORMAL LOW (ref 3.87–5.11)
RDW: 13.7 % (ref 11.5–15.5)
WBC: 10.9 10*3/uL — ABNORMAL HIGH (ref 4.0–10.5)
nRBC: 0 % (ref 0.0–0.2)

## 2023-03-07 LAB — BASIC METABOLIC PANEL
Anion gap: 8 (ref 5–15)
BUN: 9 mg/dL (ref 6–20)
CO2: 23 mmol/L (ref 22–32)
Calcium: 8.9 mg/dL (ref 8.9–10.3)
Chloride: 106 mmol/L (ref 98–111)
Creatinine, Ser: 0.8 mg/dL (ref 0.44–1.00)
GFR, Estimated: >60 mL/min (ref >60)
Glucose, Bld: 140 mg/dL — ABNORMAL HIGH (ref 70–99)
Potassium: 3.5 mmol/L (ref 3.5–5.1)
Sodium: 137 mmol/L (ref 135–145)

## 2023-03-07 NOTE — Telephone Encounter (Signed)
 Patient called. Says she would like stronger pain medication. Has not been able to sleep because of her pain. Her [phone]

## 2023-03-07 NOTE — Progress Notes (Signed)
 Physical Therapy Treatment Patient Details Name: Holly Mendez MRN: [id number] DOB: 1975-04-28 Today's Date: 03/07/2023   History of Present Illness 48 y.o. female presents to [facility] hospital on 03/06/2023 for elective R TKA. PMH includes bipolar affective disorder, L eye blindness, asthma, spondylosis.    PT Comments  Pt received in supine after premedication >90 mins prior to session, pt c/o severe RLE pain "10/10" and RN notified and agreeable to give additional pain meds, pt agreeable to some instruction on supine RLE HEP (handout provided to reinforce) and benefits of iceman and notifying staff when ice needs to be refilled, as well as R knee precautions. Pt agreeable to attempt EOB/OOB mobility in next session after pain is better controlled. Pt continues to benefit from PT services to progress toward functional mobility goals.     If plan is discharge home, recommend the following: A little help with bathing/dressing/bathroom;Assistance with cooking/housework;Assist for transportation;Help with stairs or ramp for entrance   Can travel by private vehicle        Equipment Recommendations  Rolling walker (2 wheels)    Recommendations for Other Services       Precautions / Restrictions Precautions Precautions: Fall;Knee Precaution Booklet Issued: Yes (comment) Recall of Precautions/Restrictions: Intact Precaution/Restrictions Comments: TKA handout brought to her room Restrictions Weight Bearing Restrictions Per Provider Order: Yes RLE Weight Bearing Per Provider Order: Weight bearing as tolerated     Mobility  Bed Mobility Overal bed mobility: Needs Assistance             General bed mobility comments: pt defers    Transfers Overall transfer level: Needs assistance                 General transfer comment: pt defers    Ambulation/Gait               General Gait Details: pt defers until later   Stairs             Wheelchair Mobility      Tilt Bed    Modified Rankin (Stroke Patients Only)       Balance Overall balance assessment: Needs assistance     Sitting balance - Comments: not yet assessed this date; pt defers                                    Communication Communication Communication: No apparent difficulties  Cognition Arousal: Alert Behavior During Therapy: WFL for tasks assessed/performed   PT - Cognitive impairments: No apparent impairments                       PT - Cognition Comments: limited assessment, pt anxious due to pain and defers OOB at time of session. Following commands: Intact      Cueing Cueing Techniques: Verbal cues  Exercises Total Joint Exercises Ankle Circles/Pumps: AROM, Both, 10 reps, Supine Quad Sets: AROM, Both, 5 reps, Supine    General Comments General comments (skin integrity, edema, etc.): VSS on RA per chart review; iceman not donned so PTA refilled it and applied it, pt agreeable to notify staff if ice again melts and device does not feel cool.      Pertinent Vitals/Pain Pain Assessment Pain Assessment: 0-10 Pain Score: 10-Worst pain ever Pain Location: R knee despite premedication Pain Descriptors / Indicators: Aching, Grimacing, Guarding, Moaning, Throbbing Pain Intervention(s): Limited activity within patient's tolerance, Monitored during  session, Premedicated before session, Repositioned, Patient requesting pain meds-RN notified, Ice applied    Home Living                          Prior Function            PT Goals (current goals can now be found in the care plan section) Acute Rehab PT Goals Patient Stated Goal: to return to independence PT Goal Formulation: With patient Time For Goal Achievement: 03/10/23 Progress towards PT goals: Not progressing toward goals - comment (TBD next session)    Frequency    7X/week      PT Plan      Co-evaluation              AM-PAC PT "6 Clicks" Mobility    Outcome Measure  Help needed turning from your back to your side while in a flat bed without using bedrails?: A Little Help needed moving from lying on your back to sitting on the side of a flat bed without using bedrails?: A Little Help needed moving to and from a bed to a chair (including a wheelchair)?: A Little Help needed standing up from a chair using your arms (e.g., wheelchair or bedside chair)?: A Lot Help needed to walk in hospital room?: Total (anticipated; defer to PM session) Help needed climbing 3-5 steps with a railing? : Total 6 Click Score: 13    End of Session Equipment Utilized During Treatment: Gait belt (gt belt brought to her room to use later) Activity Tolerance: Patient limited by pain Patient left: in bed;with call bell/phone within reach;with bed alarm set;Other (comment) (RLE iceman donned) Nurse Communication: Mobility status;Patient requests pain meds PT Visit Diagnosis: Other abnormalities of gait and mobility (R26.89);Muscle weakness (generalized) (M62.81);Pain Pain - Right/Left: Right Pain - part of body: Knee     Time: 1022-1032 PT Time Calculation (min) (ACUTE ONLY): 10 min  Charges:    $Therapeutic Exercise: 8-22 mins PT General Charges $$ ACUTE PT VISIT: 1 Visit                     Jamieka Royle P., PTA Acute Rehabilitation Services Secure Chat Preferred 9a-5:30pm Office: [phone]    Dorathy Kinsman [employer] [facility] 03/07/2023, 11:38 AM

## 2023-03-07 NOTE — Progress Notes (Signed)
 CSW spoke with pt regarding SDOH: food. Pt reports she does receive $199/month in food stamps, but this does not cover the entire month.  Pt lives alone, is cut off from her family in [place], and only uses medicaid transportation for MD appts.  We discussed food pantry options, pt does not currently have other transportation but is working on this and was recently told that her medicaid transport may be able to take her for groceries now.  She is going to pursue this.  [employer] provided. Daleen Squibb, MSW, LCSW 2/26/20254:14 PM

## 2023-03-07 NOTE — Progress Notes (Signed)
 Mobility Specialist Progress Note:    03/07/23 1400  Mobility  Activity Transferred from chair to bed  Level of Assistance Minimal assist, patient does 75% or more  Assistive Device Front wheel walker  Distance Ambulated (ft) 4 ft  RLE Weight Bearing Per Provider Order WBAT  Activity Response Tolerated well  Mobility Referral Yes  Mobility visit 1 Mobility  Mobility Specialist Start Time (ACUTE ONLY) 1337  Mobility Specialist Stop Time (ACUTE ONLY) 1346  Mobility Specialist Time Calculation (min) (ACUTE ONLY) 9 min   Pt received in chair, requesting assistance back to bed. C/o 10/10 RLE pain throughout. Able to stand and take steps to bed w/ minA. Pt left in bed with call bell. RN and family present.  D'Vante Earlene Plater Mobility Specialist Please contact via Special educational needs teacher or Rehab office at [phone]

## 2023-03-07 NOTE — Progress Notes (Signed)
 Subjective: 1 Day Post-Op Procedure(s) (LRB): RIGHT TOTAL KNEE ARTHROPLASTY (Right) Patient reports pain as moderate.    Objective: Vital signs in last 24 hours: Temp:  [97.5 F (36.4 C)-98.7 F (37.1 C)] 98.4 F (36.9 C) (02/26 0418) Pulse Rate:  [60-105] 87 (02/26 0418) Resp:  [10-20] 16 (02/26 0418) BP: (93-133)/(51-100) 103/51 (02/26 0418) SpO2:  [89 %-100 %] 100 % (02/26 0418) Weight:  [103.9 kg] 103.9 kg (02/25 1018)  Intake/Output from previous day: 02/25 0701 - 02/26 0700 In: 1221.5 [I.V.:1201.5; IV Piggyback:19.9] Out: 850 [Urine:800; Blood:50] Intake/Output this shift: No intake/output data recorded.  Recent Labs    03/05/23 0900 03/07/23 0556  HGB 13.5 10.7*   Recent Labs    03/05/23 0900 03/07/23 0556  WBC 6.2 10.9*  RBC 4.66 3.65*  HCT 40.0 31.4*  PLT 279 201   Recent Labs    03/05/23 0900 03/07/23 0556  NA 135 137  K 3.7 3.5  CL 103 106  CO2 24 23  BUN 6 9  CREATININE 0.72 0.80  GLUCOSE 122* 140*  CALCIUM 9.8 8.9   No results for input(s): "LABPT", "INR" in the last 72 hours.  Sensation intact distally Intact pulses distally Dorsiflexion/Plantar flexion intact Incision: scant drainage Compartment soft   Assessment/Plan: 1 Day Post-Op Procedure(s) (LRB): RIGHT TOTAL KNEE ARTHROPLASTY (Right) Up with therapy      Kathryne Hitch 03/07/2023, 7:37 AM

## 2023-03-07 NOTE — Progress Notes (Signed)
 Physical Therapy Treatment Patient Details Name: Holly Mendez MRN: [id number] DOB: 1975/03/12 Today's Date: 03/07/2023   History of Present Illness 48 y.o. female presents to [facility] hospital on 03/06/2023 for elective R TKA. PMH includes bipolar affective disorder, L eye blindness, asthma, spondylosis.    PT Comments  Pt received in long sitting in bed, pt agreeable to therapy session and with improved participation and tolerance for transfer and gait training after premedication. Pt needing up to minA for bed mobility due to RLE pain and up to CGA for transfers and close Supervision for gait with RW support. Pt reports pain remains severe at times and she does not feel ready for home yet, pt encouraged to work with mobility specialist and nursing staff later in day to progress standing/gait tolerance in preparation for home. Pt has an elevator at her apartment and does not need to perform stairs for home. Pt continues to benefit from PT services to progress toward functional mobility goals.    If plan is discharge home, recommend the following: A little help with bathing/dressing/bathroom;Assistance with cooking/housework;Assist for transportation;Help with stairs or ramp for entrance   Can travel by private vehicle        Equipment Recommendations  Rolling walker (2 wheels)    Recommendations for Other Services       Precautions / Restrictions Precautions Precautions: Fall;Knee Precaution Booklet Issued: Yes (comment) Recall of Precautions/Restrictions: Intact Precaution/Restrictions Comments: TKA handout brought to her room Restrictions Weight Bearing Restrictions Per Provider Order: Yes RLE Weight Bearing Per Provider Order: Weight bearing as tolerated     Mobility  Bed Mobility Overal bed mobility: Needs Assistance Bed Mobility: Supine to Sit     Supine to sit: HOB elevated, Used rails, Min assist     General bed mobility comments: Increased time/effort to reach L EOB,  pt shown how to use gait belt as a leg lifter but needed light minA on RLE once leg over edge of bed so she can use her arms to scoot hips forward.    Transfers Overall transfer level: Needs assistance Equipment used: Rolling walker (2 wheels) Transfers: Sit to/from Stand Sit to Stand: Contact guard assist           General transfer comment: EOB>RW and RW>chair, pt ignoring cues to reach back when sitting and decreased eccentric control to sit.    Ambulation/Gait Ambulation/Gait assistance: Supervision Gait Distance (Feet): 200 Feet Assistive device: Rolling walker (2 wheels) Gait Pattern/deviations: Step-through pattern, Trunk flexed       General Gait Details: min cues for improved upright posture and closer proximity to RW, pt tends to flex forward at hips and keeps RW too far advanced; improved standing/gait tolerance this date. Pt still c/o some anterior RLE numbness distal to and over patella but no instability/buckling or foot drop observed.   Stairs Stairs:  (pt has an Engineer, structural)           Wheelchair Mobility     Tilt Bed    Modified Rankin (Stroke Patients Only)       Balance Overall balance assessment: Needs assistance Sitting-balance support: No upper extremity supported, Feet supported Sitting balance-Leahy Scale: Good Sitting balance - Comments: not yet assessed this date; pt defers   Standing balance support: Bilateral upper extremity supported, Reliant on assistive device for balance Standing balance-Leahy Scale: Poor Standing balance comment: reliant on RW  Communication Communication Communication: No apparent difficulties  Cognition Arousal: Alert Behavior During Therapy: WFL for tasks assessed/performed   PT - Cognitive impairments: No apparent impairments                       PT - Cognition Comments: limited assessment, pt anxious due to pain and defers OOB at time of session. Following  commands: Intact      Cueing Cueing Techniques: Verbal cues  Exercises Total Joint Exercises Ankle Circles/Pumps: AROM, Both, 10 reps, Supine Quad Sets: AROM, Both, 5 reps, Supine Long Arc Quad: AROM, Right, 10 reps, Seated Knee Flexion: AROM, Right, 10 reps, Seated, AAROM (pt able to use LLE also to provide overpressure on RLE to flex further) Goniometric ROM: grossly 0 deg to 80 deg sitting EOB    General Comments General comments (skin integrity, edema, etc.): VSS on RA per chart review; iceman re-donned      Pertinent Vitals/Pain Pain Assessment Pain Assessment: Faces Pain Score: 10-Worst pain ever Faces Pain Scale: Hurts even more Pain Location: R knee Pain Descriptors / Indicators: Aching, Grimacing, Guarding Pain Intervention(s): Monitored during session, Premedicated before session, Repositioned, Ice applied    Home Living                          Prior Function            PT Goals (current goals can now be found in the care plan section) Acute Rehab PT Goals Patient Stated Goal: to return to independence PT Goal Formulation: With patient Time For Goal Achievement: 03/10/23 Progress towards PT goals: Progressing toward goals    Frequency    7X/week      PT Plan      Co-evaluation              AM-PAC PT "6 Clicks" Mobility   Outcome Measure  Help needed turning from your back to your side while in a flat bed without using bedrails?: A Little Help needed moving from lying on your back to sitting on the side of a flat bed without using bedrails?: A Little Help needed moving to and from a bed to a chair (including a wheelchair)?: A Little Help needed standing up from a chair using your arms (e.g., wheelchair or bedside chair)?: A Little Help needed to walk in hospital room?: A Little Help needed climbing 3-5 steps with a railing? : A Little 6 Click Score: 18    End of Session Equipment Utilized During Treatment: Gait belt Activity  Tolerance: Patient tolerated treatment well Patient left: with call bell/phone within reach;Other (comment);in chair;with chair alarm set (RLE iceman donned) Nurse Communication: Mobility status;Other (comment) (pt chair brakes not working well) PT Visit Diagnosis: Other abnormalities of gait and mobility (R26.89);Muscle weakness (generalized) (M62.81);Pain Pain - Right/Left: Right Pain - part of body: Knee     Time: 1140-1205 PT Time Calculation (min) (ACUTE ONLY): 25 min  Charges:    $Gait Training: 8-22 mins $Therapeutic Exercise: 8-22 mins PT General Charges $$ ACUTE PT VISIT: 1 Visit                     Alin Hutchins P., PTA Acute Rehabilitation Services Secure Chat Preferred 9a-5:30pm Office: [phone]    Dorathy Kinsman [facility] 03/07/2023, 12:18 PM

## 2023-03-08 ENCOUNTER — Telehealth: Payer: Self-pay | Admitting: Physician Assistant

## 2023-03-08 ENCOUNTER — Telehealth: Payer: Self-pay | Admitting: Orthopaedic Surgery

## 2023-03-08 DIAGNOSIS — M1711 Unilateral primary osteoarthritis, right knee: Secondary | ICD-10-CM | POA: Diagnosis not present

## 2023-03-08 MED ORDER — ASPIRIN 81 MG PO CHEW: 81.0000 mg | CHEWABLE_TABLET | Freq: Two times a day (BID) | ORAL | 0 refills | Status: DC

## 2023-03-08 MED ORDER — TIZANIDINE HCL 4 MG PO TABS: 4.0000 mg | ORAL_TABLET | Freq: Four times a day (QID) | ORAL | 1 refills | Status: DC | PRN

## 2023-03-08 MED ORDER — OXYCODONE HCL 5 MG PO TABS
5.0000 mg | ORAL_TABLET | ORAL | 0 refills | Status: DC | PRN
Start: 2023-03-08 — End: 2023-03-09

## 2023-03-08 NOTE — Telephone Encounter (Signed)
 PA has been sent.

## 2023-03-08 NOTE — Telephone Encounter (Signed)
 Pt called requesting pre auth be sent to [facility] on [address] for her oxy. Please send today and call pt at [phone].

## 2023-03-08 NOTE — Progress Notes (Signed)
 PT Cancellation Note  Patient Details Name: Holly Mendez MRN: [id number] DOB: 1975/09/23   Cancelled Treatment:    Reason Eval/Treat Not Completed: (P) Other (comment);Patient declined, no reason specified (pt refuses additional PT session due to feeling tired/cold and did ambulate with mobility specialist earlier in the day per PTA request. When PTA offers to place iceman to help with pain/swelling, pt now c/o room feeling cold and defers, thermostat turned up. Pt offered warm blanket but defers.) PTA encouraged her to continue RLE range of motion as tolerated later in the day if pain allows as pt defers to attempt LE HEP at this time. Will continue efforts per PT plan of care next date as schedule permits.   Dorathy Kinsman Anaid Haney 03/08/2023, 5:32 PM

## 2023-03-08 NOTE — Progress Notes (Signed)
    Durable Medical Equipment  (From admission, onward)           Start     Ordered   03/08/23 1628  DME 3 n 1  Once       Comments: Bedside commode, confined to one room   03/08/23 1628   03/06/23 1629  DME Walker rolling  Once       Question Answer Comment  Walker: With 5 Inch Wheels   Patient needs a walker to treat with the following condition Status post total right knee replacement      03/06/23 1628

## 2023-03-08 NOTE — Progress Notes (Signed)
 Mobility Specialist Progress Note:    03/08/23 1500  Mobility  Activity Ambulated with assistance in hallway  Level of Assistance Contact guard assist, steadying assist  Assistive Device Front wheel walker  Distance Ambulated (ft) 200 ft  RLE Weight Bearing Per Provider Order WBAT  Activity Response Tolerated well  Mobility Referral Yes  Mobility visit 1 Mobility  Mobility Specialist Start Time (ACUTE ONLY) 1528  Mobility Specialist Stop Time (ACUTE ONLY) 1542  Mobility Specialist Time Calculation (min) (ACUTE ONLY) 14 min   Pt received in bed and agreeable. Utilized gait belt to guide RLE off/on bed. Only required contact guard throughout. Took x1 standing rest break. Returned to room w/o fault. Pt left in bed with call bell and all needs met.  D'Vante Earlene Plater Mobility Specialist Please contact via Special educational needs teacher or Rehab office at [phone]

## 2023-03-08 NOTE — TOC Initial Note (Signed)
 Transition of Care [facility]) - Initial/Assessment Note    Patient Details  Name: Holly Mendez MRN: [id number] Date of Birth: Oct 21, 1975  [facility] CM/SW Contact:    Epifanio Lesches, RN Phone Number: 03/08/2023, 4:34 PM  Clinical Narrative:                     -s/p R TKA  From home. PTA independent with ADL's.  Home health order noted. NCM unable to secure home health services 2/2 payor source. Pt states ok with outpatient services if needed.  States she would use Medicaid transportation. Referral made with Zach/ [employer] for RW. Equipment will be deliverd to bedside prior to d/c.  Sister to assist with care once home.   TOC team following and will assist with needs...  Expected Discharge Plan: Home/Self Care Barriers to Discharge: Continued Medical Work up   Patient Goals and CMS Choice     Choice offered to / list presented to : Patient      Expected Discharge Plan and Services   Discharge Planning Services: CM Consult                     DME Arranged: Bedside commode, Walker rolling   Date DME Agency Contacted: 03/08/23 Time DME Agency Contacted: 1630 Representative spoke with at DME Agency: Ian Malkin            Prior Living Arrangements/Services     Activities of Daily Living   ADL Screening (condition at time of admission) Independently performs ADLs?: Yes (appropriate for developmental age) Is the patient deaf or have difficulty hearing?: No Does the patient have difficulty seeing, even when wearing glasses/contacts?: No Does the patient have difficulty concentrating, remembering, or making decisions?: No  Permission Sought/Granted                  Emotional Assessment              Admission diagnosis:  Status post total right knee replacement [Z96.651] Patient Active Problem List   Diagnosis Date Noted   Status post total right knee replacement 03/06/2023   Right knee injury, sequela 08/03/2022   Gait  abnormality 04/27/2022   Osteoarthritis of right knee 04/27/2022   Injury of meniscus of left knee 09/08/2021   Left knee pain 08/11/2021   Encounter for chronic pain management 05/27/2020   H/O total vaginal hysterectomy 05/31/2019   Spondylosis without myelopathy or radiculopathy, lumbosacral region 04/18/2017   Vitamin D deficiency 04/18/2017   Problems influencing health status 04/04/2017   Bacterial vaginosis, recurrent    Blind left eye 12/10/2014   Possiblle Anterior communicating artery aneurysm 10/30/2014   Hyperglycemia 10/17/2014   Morbid obesity (HCC) 09/24/2014   Meningitis, hx, 2016 09/21/2014   Asthma 11/21/2013   Seasonal allergies 11/21/2013   Bipolar affective disorder, currently in remission (HCC) 02/04/2011   Hypertension 02/04/2011   PCP:  Nestor Ramp, MD Pharmacy:   [facility] [id number] - [place], [place] - [address] AT SEC [address] [place] [address] Phone: [phone] Fax: [phone]  [facility] [address] [place] [address] Phone: [phone] Fax: [phone]     Social Drivers of Health (SDOH) Social History: SDOH Screenings   Food Insecurity: Food Insecurity Present (03/06/2023)  Housing: Low Risk  (03/06/2023)  Transportation Needs: No Transportation Needs (03/06/2023)  Utilities: Not At Risk (  03/06/2023)  Depression (PHQ2-9): High Risk (01/24/2023)  Social Connections: Socially Isolated (03/06/2023)  Tobacco Use: Low Risk  (03/06/2023)  Recent Concern: Tobacco Use - Medium Risk (02/12/2023)   SDOH Interventions: Food Insecurity Interventions: Inpatient TOC, Other (Comment) [employer] county Environmental manager provided)   Readmission Risk Interventions     No data to display

## 2023-03-08 NOTE — Telephone Encounter (Signed)
 Patient sister([place]) called requesting for a call back regarding being caregiver for patient. Tawanna Sat [phone]

## 2023-03-08 NOTE — Progress Notes (Signed)
 Subjective: 2 Days Post-Op Procedure(s) (LRB): RIGHT TOTAL KNEE ARTHROPLASTY (Right) Patient reports pain as severe.  Eating well, no nausea or vomiting. Progressing well with PT. States sister came into town yesterday and will be available post op .   Objective: Vital signs in last 24 hours: Temp:  [98.8 F (37.1 C)-99.6 F (37.6 C)] 99.6 F (37.6 C) (02/27 0819) Pulse Rate:  [91-94] 94 (02/27 0819) Resp:  [18-19] 19 (02/27 0819) BP: (94-131)/(56-76) 94/56 (02/27 0819) SpO2:  [95 %-100 %] 95 % (02/27 0819)  Intake/Output from previous day: 02/26 0701 - 02/27 0700 In: 249.5 [P.O.:240; I.V.:9.5] Out: 1600 [Urine:1600] Intake/Output this shift: Total I/O In: -  Out: 200 [Urine:200]  Recent Labs    03/07/23 0556  HGB 10.7*   Recent Labs    03/07/23 0556  WBC 10.9*  RBC 3.65*  HCT 31.4*  PLT 201   Recent Labs    03/07/23 0556  NA 137  K 3.5  CL 106  CO2 23  BUN 9  CREATININE 0.80  GLUCOSE 140*  CALCIUM 8.9   No results for input(s): "LABPT", "INR" in the last 72 hours.  Dorsiflexion/Plantar flexion intact Incision: scant drainage Compartment soft   Assessment/Plan: 2 Days Post-Op Procedure(s) (LRB): RIGHT TOTAL KNEE ARTHROPLASTY (Right) Up with therapy Discharge home with home health anticipate tomorrow .       Holly Mendez 03/08/2023, 11:06 AM

## 2023-03-08 NOTE — Progress Notes (Signed)
 Physical Therapy Treatment Patient Details Name: Holly Mendez MRN: [id number] DOB: 1975/04/19 Today's Date: 03/08/2023   History of Present Illness 48 y.o. female presents to [facility] hospital on 03/06/2023 for elective R TKA. PMH includes bipolar affective disorder, L eye blindness, asthma, spondylosis.    PT Comments  Pt received sitting EOB, c/o increased fatigue and RLE pain after recently toileting/showering but agreeable to therapy session with emphasis on seated and supine RLE exercises for strengthening/ROM and placement of TED hose/use of iceman. Pt guarding due to pain and difficulty extending R knee against gravity this date without active assist, PTA re-emphasizing importance of compliance 3-4 times a day to HEP program to ensure future functioning/ROM of her knee, especially the first 1-2 weeks. Pt asking PTA to unlock the knee portion of her bed but PTA reminds her this is not advised as MD does not want her resting with her R knee in flexion, and pillow and towel used to assist her to elevate RLE and keep it in extension. Pt continues to benefit from PT services to progress toward functional mobility goals.     If plan is discharge home, recommend the following: A little help with bathing/dressing/bathroom;Assistance with cooking/housework;Assist for transportation;Help with stairs or ramp for entrance   Can travel by private vehicle        Equipment Recommendations  Rolling walker (2 wheels)    Recommendations for Other Services       Precautions / Restrictions Precautions Precautions: Fall;Knee Precaution Booklet Issued: Yes (comment) Recall of Precautions/Restrictions: Intact Precaution/Restrictions Comments: TKA handout in her room Restrictions Weight Bearing Restrictions Per Provider Order: Yes RLE Weight Bearing Per Provider Order: Weight bearing as tolerated     Mobility  Bed Mobility Overal bed mobility: Needs Assistance Bed Mobility: Sit to Supine        Sit to supine: Contact guard assist, Used rails   General bed mobility comments: Cues to scoot toward [facility] for better posture prior to return to supine. Increased time/effort to return to supine, pt using gait belt as a leg lifter and CGA from PTA for safety, intermittent use of hospital bed features, HOB ~15 deg    Transfers Overall transfer level: Needs assistance     Sit to Stand: Contact guard assist          Lateral/Scoot Transfers: Supervision General transfer comment: partial stand with pt holding rail/mattress with CGA and supervision for lateral scoot toward HOB x3 scoots with increased time/effort prior to return to supine.    Ambulation/Gait               General Gait Details: pt defers due to c/o RLE pain after recent return from bathroom to bed.   Stairs             Wheelchair Mobility     Tilt Bed    Modified Rankin (Stroke Patients Only)       Balance Overall balance assessment: Needs assistance Sitting-balance support: No upper extremity supported, Feet supported Sitting balance-Leahy Scale: Good     Standing balance support: Bilateral upper extremity supported, Reliant on assistive device for balance Standing balance-Leahy Scale: Poor Standing balance comment: reliant on RW                            Communication Communication Communication: No apparent difficulties  Cognition Arousal: Alert Behavior During Therapy: WFL for tasks assessed/performed, Anxious   PT - Cognitive impairments: No apparent impairments  PT - Cognition Comments: Anxious in anticipation of pain, pt defers OOB/standing as she recently returned from bathroom. Following commands: Intact      Cueing Cueing Techniques: Verbal cues  Exercises Total Joint Exercises Ankle Circles/Pumps: AROM, Both, 10 reps, Supine Quad Sets: AROM, Both, 5 reps, Supine Towel Squeeze: AROM, Both, 5 reps, Supine Heel Slides:  (pt defers  due to pain after LAQ) Hip ABduction/ADduction:  (vcs) Straight Leg Raises: AAROM, Right, Supine (a couple reps with positioning but poor tolerance) Long Arc Quad: AROM, Right, Seated, AAROM, 10 reps (AA for better extension, pt with limited apparent effort today when extending due to c/o pain) Knee Flexion: AROM, Right, 10 reps, Seated, AAROM (pt able to use LLE also to provide overpressure on RLE to flex further with cues) Goniometric ROM: R knee flexion AAROM grossly 5 deg to 80 deg seated EOB    General Comments General comments (skin integrity, edema, etc.): pt and family member instructed on RLE iceman use/how to refill and to notify staff more often when ice has melted so she can keep pain/swelling down. Pt c/o pain and encouraged to notify RN when she needs pain meds. Pt daughter present but not attending to instructions given to patient and per pt, her daughter will not be assisting her at home.     Pertinent Vitals/Pain Pain Assessment Pain Assessment: Faces Faces Pain Scale: Hurts whole lot (with flexion) Pain Location: R knee Pain Descriptors / Indicators: Aching, Grimacing, Guarding, Numbness, Moaning Pain Intervention(s): Limited activity within patient's tolerance, Monitored during session, Repositioned, Patient requesting pain meds-RN notified, Ice applied (iceman refilled, cues for pursed-lip breathing and light tapping/rubbing for numb areas of her limb)     PT Goals (current goals can now be found in the care plan section) Acute Rehab PT Goals Patient Stated Goal: to return to independence PT Goal Formulation: With patient Time For Goal Achievement: 03/10/23 Progress towards PT goals: Progressing toward goals (slowly; pain limiting her)    Frequency    7X/week      PT Plan         AM-PAC PT "6 Clicks" Mobility   Outcome Measure  Help needed turning from your back to your side while in a flat bed without using bedrails?: A Little Help needed moving from  lying on your back to sitting on the side of a flat bed without using bedrails?: A Little Help needed moving to and from a bed to a chair (including a wheelchair)?: A Little Help needed standing up from a chair using your arms (e.g., wheelchair or bedside chair)?: A Little Help needed to walk in hospital room?: A Little Help needed climbing 3-5 steps with a railing? : A Little 6 Click Score: 18    End of Session Equipment Utilized During Treatment: Gait belt Activity Tolerance: Patient limited by fatigue;Patient limited by pain Patient left: with call bell/phone within reach;Other (comment);in bed;with bed alarm set (bil TED hose (R-thigh high, L-knee high) then RLE iceman donned, RLE elevated with towel roll under ankle and pillow under rest of limb for comfort and to ensure RLE remains extended; knee portion of her bed locked to prevent R knee flexion at rest) Nurse Communication: Mobility status;Other (comment);Patient requests pain meds (pt chair brakes not working well) PT Visit Diagnosis: Other abnormalities of gait and mobility (R26.89);Muscle weakness (generalized) (M62.81);Pain Pain - Right/Left: Right Pain - part of body: Knee     Time: 0454-0981 PT Time Calculation (min) (ACUTE ONLY): 22 min  Charges:    $  Therapeutic Exercise: 8-22 mins PT General Charges $$ ACUTE PT VISIT: 1 Visit                     Tula Schryver P., PTA Acute Rehabilitation Services Secure Chat Preferred 9a-5:30pm Office: [phone]    Dorathy Kinsman [facility] 03/08/2023, 2:06 PM

## 2023-03-08 NOTE — Telephone Encounter (Signed)
 Patient sister wondering if she would need care, I told her she ABSOLUTELY needs assistance

## 2023-03-09 ENCOUNTER — Other Ambulatory Visit: Payer: Self-pay | Admitting: Orthopaedic Surgery

## 2023-03-09 ENCOUNTER — Telehealth: Payer: Self-pay

## 2023-03-09 DIAGNOSIS — M1711 Unilateral primary osteoarthritis, right knee: Secondary | ICD-10-CM | POA: Diagnosis not present

## 2023-03-09 MED ORDER — OXYCODONE HCL 5 MG PO TABS
5.0000 mg | ORAL_TABLET | ORAL | 0 refills | Status: DC | PRN
Start: 2023-03-09 — End: 2023-03-13
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
## 2023-03-09 NOTE — Progress Notes (Signed)
 Patient ID: Holly Mendez, female   DOB: 07/20/1975, 48 y.o.   MRN: [id number] The goal is to discharge the patient to home today.  There is nothing worrisome about her vital signs and the operative knee has been stable.  She has been participating in PT.

## 2023-03-09 NOTE — Telephone Encounter (Signed)
 Patient called stating that Rx for Oxycodone needs to be sent to [facility] at [address] due to [facility] in [place] not having any Oxycodone and is currently on back order.  Patient had right knee surgery on 03/06/2023.  Cb# [id number].  Please advise.  Thank you.

## 2023-03-09 NOTE — TOC Transition Note (Signed)
 Transition of Care [facility]) - Discharge Note   Patient Details  Name: Holly Mendez MRN: [id number] Date of Birth: [birth year]  [facility] CM/SW Contact:  Epifanio Lesches, RN Phone Number: 03/09/2023, 10:24 AM   Clinical Narrative:    Patient will DC to: home Anticipated DC date: 03/09/2023 Family notified: yes Transport by: [employer] [phone]), trip ID# [phone] -s/p R TKA          Per MD patient ready for DC today . RN, patient, and  patient's family notified of DC.  Referral for outpatient PT arranged and noted on AVS. DME: RW and BSC, [employer] ([phone]) to deliver to pt in discharge lounge. [employer] to pick pt up for transportation to home plus pharmacy stop,  ETA 30 min-3 hour window. Pt without RX med concerns.  RNCM will sign off for now as intervention is no longer needed. Please consult [name] again if new needs arise.    Final next level of care: Home/Self Care Barriers to Discharge: Continued Medical Work up   Patient Goals and CMS Choice     Choice offered to / list presented to : Patient      Discharge Placement                       Discharge Plan and Services Additional resources added to the After Visit Summary for     Discharge Planning Services: CM Consult            DME Arranged: Bedside commode, Walker rolling   Date DME Agency Contacted: 03/08/23 Time DME Agency Contacted: 1630 Representative spoke with at DME Agency: Ian Malkin            Social Drivers of Health (SDOH) Interventions SDOH Screenings   Food Insecurity: Food Insecurity Present (03/06/2023)  Housing: Low Risk  (03/06/2023)  Transportation Needs: No Transportation Needs (03/06/2023)  Utilities: Not At Risk (03/06/2023)  Depression (PHQ2-9): High Risk (01/24/2023)  Social Connections: Socially Isolated (03/06/2023)  Tobacco Use: Low Risk  (03/06/2023)  Recent Concern: Tobacco Use - Medium Risk (02/12/2023)      Readmission Risk Interventions     No data to display

## 2023-03-09 NOTE — Progress Notes (Signed)
 DISCHARGE NOTE HOME PRESLEA RHODUS to be discharged Home per MD order. Discussed prescriptions and follow up appointments with the patient. Prescriptions given to patient; medication list explained in detail. Patient verbalized understanding.  Skin clean, dry and intact without evidence of skin break down, no evidence of skin tears noted. IV catheter discontinued intact. Site without signs and symptoms of complications. Dressing and pressure applied. Pt denies pain at the site currently. No complaints noted.  Patient free of lines, drains, and wounds other than noted in [facility]  An After Visit Summary (AVS) was printed and given to the patient.  Being transported to discharge lounge to await DME and Ride Patient will be escorted via wheelchair, and discharged home via private auto.  Velia Meyer, RN

## 2023-03-12 ENCOUNTER — Telehealth: Payer: Self-pay

## 2023-03-12 NOTE — Telephone Encounter (Signed)
 Patient calls nurse line regarding prescription refill on hydrocodone-acetaminophen 5-325 mg.   She states that she is due for rx refill on 03/14/23.   Medication is not on current med list due to Dr. Magnus Ivan prescribing oxycodone 5 mg on 03/09/23. She reports that she has 17 pills left from this prescription.   Advised patient that I would forward this request to PCP.   Requesting that hydrocodone-acetaminophen 5-325 mg #120 be sent to [facility] in [place].   [name], RN

## 2023-03-13 ENCOUNTER — Telehealth: Payer: Self-pay | Admitting: Family Medicine

## 2023-03-13 MED ORDER — HYDROCODONE-ACETAMINOPHEN 5-325 MG PO TABS: ORAL_TABLET | ORAL | 0 refills | Status: DC

## 2023-03-13 NOTE — Telephone Encounter (Signed)
 Spoke with her about pain medication.  She did receive some oxycodone codon from Dr. Magnus Ivan and will run out of that this Friday.  We counted up her pills and she has enough of the residual hydrocodone left from her chronic medication to last her until the 10th.  I will send in a new prescription for the 10th.  She will not get any more medication from Dr. Magnus Ivan or anyone else as far as opioids.  She is having difficulty at home and wants to retry getting PCS.  I will send that through.

## 2023-03-13 NOTE — Telephone Encounter (Signed)
 Patient returns call to nurse line regarding concern.   Please advise.   [name], RN

## 2023-03-13 NOTE — Telephone Encounter (Signed)
 PCS forms printed off, demographics completed.   Placed in PCP box for review and completion.   [name], RN

## 2023-03-19 ENCOUNTER — Telehealth: Payer: Self-pay

## 2023-03-19 ENCOUNTER — Telehealth: Payer: Self-pay | Admitting: Orthopaedic Surgery

## 2023-03-19 ENCOUNTER — Emergency Department (HOSPITAL_COMMUNITY): Payer: MEDICAID

## 2023-03-19 ENCOUNTER — Encounter (HOSPITAL_COMMUNITY): Payer: Self-pay

## 2023-03-19 ENCOUNTER — Other Ambulatory Visit: Payer: Self-pay

## 2023-03-19 ENCOUNTER — Emergency Department (HOSPITAL_COMMUNITY)
Admission: EM | Admit: 2023-03-19 | Discharge: 2023-03-19 | Disposition: A | Discharge status: Home / Self Care | Payer: MEDICAID | Attending: Emergency Medicine | Admitting: Emergency Medicine

## 2023-03-19 DIAGNOSIS — I10 Essential (primary) hypertension: Secondary | ICD-10-CM | POA: Insufficient documentation

## 2023-03-19 DIAGNOSIS — Z7982 Long term (current) use of aspirin: Secondary | ICD-10-CM | POA: Insufficient documentation

## 2023-03-19 DIAGNOSIS — M25561 Pain in right knee: Secondary | ICD-10-CM | POA: Diagnosis present

## 2023-03-19 DIAGNOSIS — W19XXXA Unspecified fall, initial encounter: Secondary | ICD-10-CM | POA: Diagnosis not present

## 2023-03-19 DIAGNOSIS — Z79899 Other long term (current) drug therapy: Secondary | ICD-10-CM | POA: Insufficient documentation

## 2023-03-19 DIAGNOSIS — M7989 Other specified soft tissue disorders: Secondary | ICD-10-CM | POA: Insufficient documentation

## 2023-03-19 LAB — CBC WITH DIFFERENTIAL/PLATELET
Abs Immature Granulocytes: 0.03 10*3/uL (ref 0.00–0.07)
Basophils Absolute: 0 10*3/uL (ref 0.0–0.1)
Basophils Relative: 0 %
Eosinophils Absolute: 0 10*3/uL (ref 0.0–0.5)
Eosinophils Relative: 0 %
HCT: 31.9 % — ABNORMAL LOW (ref 36.0–46.0)
Hemoglobin: 10.3 g/dL — ABNORMAL LOW (ref 12.0–15.0)
Immature Granulocytes: 0 %
Lymphocytes Relative: 39 %
Lymphs Abs: 3 10*3/uL (ref 0.7–4.0)
MCH: 28.6 pg (ref 26.0–34.0)
MCHC: 32.3 g/dL (ref 30.0–36.0)
MCV: 88.6 fL (ref 80.0–100.0)
Monocytes Absolute: 0.7 10*3/uL (ref 0.1–1.0)
Monocytes Relative: 9 %
Neutro Abs: 3.9 10*3/uL (ref 1.7–7.7)
Neutrophils Relative %: 52 %
Platelets: 616 10*3/uL — ABNORMAL HIGH (ref 150–400)
RBC: 3.6 MIL/uL — ABNORMAL LOW (ref 3.87–5.11)
RDW: 14.5 % (ref 11.5–15.5)
WBC: 7.7 10*3/uL (ref 4.0–10.5)
nRBC: 0 % (ref 0.0–0.2)

## 2023-03-19 LAB — COMPREHENSIVE METABOLIC PANEL
ALT: 29 U/L (ref 0–44)
AST: 26 U/L (ref 15–41)
Albumin: 3.6 g/dL (ref 3.5–5.0)
Alkaline Phosphatase: 70 U/L (ref 38–126)
Anion gap: 11 (ref 5–15)
BUN: 8 mg/dL (ref 6–20)
CO2: 21 mmol/L — ABNORMAL LOW (ref 22–32)
Calcium: 9.7 mg/dL (ref 8.9–10.3)
Chloride: 107 mmol/L (ref 98–111)
Creatinine, Ser: 0.67 mg/dL (ref 0.44–1.00)
GFR, Estimated: >60 mL/min (ref >60)
Glucose, Bld: 110 mg/dL — ABNORMAL HIGH (ref 70–99)
Potassium: 3.5 mmol/L (ref 3.5–5.1)
Sodium: 139 mmol/L (ref 135–145)
Total Bilirubin: 0.3 mg/dL (ref 0.0–1.2)
Total Protein: 8.5 g/dL — ABNORMAL HIGH (ref 6.5–8.1)

## 2023-03-19 MED ORDER — OXYCODONE HCL 5 MG PO TABS
5.0000 mg | ORAL_TABLET | Freq: Once | ORAL | Status: AC
  Administered 2023-03-19: 5 mg via ORAL
  Filled 2023-03-19: qty 1

## 2023-03-19 MED ORDER — OXYCODONE HCL 5 MG PO TABS: 10.0000 mg | ORAL_TABLET | Freq: Once | ORAL | Status: DC

## 2023-03-19 MED ORDER — HYDROMORPHONE HCL 1 MG/ML IJ SOLN
1.0000 mg | Freq: Once | INTRAMUSCULAR | Status: AC
  Administered 2023-03-19: 1 mg via INTRAMUSCULAR
  Filled 2023-03-19: qty 1

## 2023-03-19 MED ORDER — ACETAMINOPHEN 500 MG PO TABS
1000.0000 mg | ORAL_TABLET | Freq: Once | ORAL | Status: AC
  Administered 2023-03-19: 1000 mg via ORAL
  Filled 2023-03-19: qty 2

## 2023-03-19 NOTE — ED Provider Triage Note (Signed)
 Emergency Medicine Provider Triage Evaluation Note  Holly Mendez , a 48 y.o. female  was evaluated in triage.  Pt complains of right knee pain. Recent right knee replacement. Had a fall on Saturday and reports EMS was called out but she didn't go with them. Has been having pain since then. Reports that she called Dr. Doroteo Glassman office who wanted her to come in for a [name] to r/o DVT. Her pain however is anterior. She reports that there is a painful "lump".  Review of Systems  Positive:  Negative:   Physical Exam  BP 122/77   Pulse (!) 104   Temp 99.2 F (37.3 C) (Oral)   Resp (!) 22   Ht 5\' 5"  (1.651 m)   Wt 103.9 kg   LMP 08/31/2018   SpO2 100%   BMI 38.11 kg/m  Gen:   Awake, tearful Resp:  Normal effort  MSK:   Patient is wearing very tight leggings and hard to examine due to this is a triage setting. Does have some overlying warmth. Compartments are soft. Palpable DP and PT pulses.  Other:    Medical Decision Making  Medically screening exam initiated at 3:09 PM.  Appropriate orders placed.  CATHA ONTKO was informed that the remainder of the evaluation will be completed by another provider, this initial triage assessment does not replace that evaluation, and the importance of remaining in the ED until their evaluation is complete.  Pain is anterior, however she was sent here to an [name]. [name] and XR ordered.    Achille Rich, [place] 03/19/23 1515

## 2023-03-19 NOTE — ED Notes (Addendum)
 Pt rang call button. This nurse answered asking what did they need. Pt stated, "does it really matter." This nurse walked into room and pt stated, "Where is Durene Cal the PA." This nurse informed pt that PA was busy at this time and the last this nurse saw he was consulting surgery. Pt asked, "what is taking so long, I am ready to go. This is bs. I am not staying here for this. Durene Cal said he was going to be back 2 hours ago." This nurse asked how are you getting home since earlier you stated you did not feel comfortable." Pt stated, "I can call [place] [employer] and they can take me." This nurse tried to inform pt of her concerns earlier (previous note) and that they have not been met. Pt did not want to hear. Pt stated to nurse, "Just get out of my room. You are pissing me off and making my head hurt. I am ready to go. I don't care about no Child psychotherapist. I'm not sitting here going back and forth with you."
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
## 2023-03-19 NOTE — Discharge Instructions (Addendum)
 You were evaluated in the emergency room for knee pain.  Your x-ray showed some soft tissue swelling which is expected after surgery.  Your ultrasound did not show any blood clot.  It appears that you have adequate amount of oxycodone and Percocet at home.  Please use this as instructed please follow-up with your surgeon in the near future.  If you experience any new or worsening symptoms including extreme worsening pain, redness, swelling, drainage please return to the emergency room.

## 2023-03-19 NOTE — ED Notes (Addendum)
 Pt stated, "I need to talk to someone. I want a Child psychotherapist. I am not in the right frame of mind. I don't feel comfortable going home by myself. My knee is messed up. I can't even walk up my street." This nurse asked did pt need someone to help with physical or mental needs and pt stated, "I need help with stuff at my house. I can't move my leg good." PA was notified of pt request.

## 2023-03-19 NOTE — ED Triage Notes (Signed)
 Pt arrived via [facility] from home c/o right knee pain following recent knee surgery this past Tuesday. Pt reports calling her doctor and being advised to seek evaluation in the ER for an Ultrasound to rule out a blood clot.

## 2023-03-19 NOTE — ED Notes (Signed)
 Pt stated she is in pain again. PA notified

## 2023-03-19 NOTE — ED Notes (Signed)
 Pt lying in bed listening to [facility].

## 2023-03-19 NOTE — ED Notes (Signed)
 Pt was able to put on pants and walk around room with no assistance.

## 2023-03-19 NOTE — Telephone Encounter (Signed)
 Patient leaves very angry and tearful message on nurse line in regards to [facility] prescription.   She reports she went to pick this up today (as discussed with PCP on 3/4) however [facility] is refusing to fill until 3/14.  I called [facility] and spoke with Shanda Bumps, Manufacturing systems engineer. She reports per PDMP, the patient picked up Norco #120 on 2/12 and Oxycodone #30 on 3/1. She reports with February being a #28 day month, the patient is not due to fill maintenance Norco #120 until 3/14.  Attempted to advise on PCP telephone note from 3/4 and ok to fill on 3/10. However, she reports she would like to speak with PCP directly before they fill early.   Will forward to PCP.   Pharmacy Manager Shanda Bumps [phone].

## 2023-03-19 NOTE — ED Notes (Signed)
 Patient called Secretary phone stating that she wants PA to come read her results so she can get up [place] here before she goes to jail.

## 2023-03-19 NOTE — Telephone Encounter (Signed)
 Per answering service  Patient called states severe  knee pain and swollen wants to know what should she do or should she go to the ED.

## 2023-03-19 NOTE — ED Notes (Signed)
 Security notified and called to [facility]. Pt yelling and hollering racist comments "Fuck White People. Fuck Trump." Charge nurse notified.

## 2023-03-19 NOTE — ED Notes (Signed)
 Patient transported to X-ray

## 2023-03-19 NOTE — ED Notes (Signed)
 Pt rung call bell. This nurse was the only one available. This nurse entered room and pt apologized to this nurse. Stated, "I haven't taken my bipolar medicine in a week. Forgive me." Pt then asked for help to bathroom. This nurse placed her on bedpan and assisted pt with rolling. Pt grimacing in pain when moving right leg.

## 2023-03-20 ENCOUNTER — Other Ambulatory Visit: Payer: Self-pay | Admitting: Family Medicine

## 2023-03-20 ENCOUNTER — Ambulatory Visit (INDEPENDENT_AMBULATORY_CARE_PROVIDER_SITE_OTHER): Payer: MEDICAID | Admitting: Physician Assistant

## 2023-03-20 ENCOUNTER — Telehealth: Payer: Self-pay

## 2023-03-20 ENCOUNTER — Telehealth: Payer: Self-pay | Admitting: Radiology

## 2023-03-20 ENCOUNTER — Encounter: Payer: Self-pay | Admitting: Physician Assistant

## 2023-03-20 DIAGNOSIS — Z96651 Presence of right artificial knee joint: Secondary | ICD-10-CM

## 2023-03-20 NOTE — Progress Notes (Signed)
 Received mssg from pharmacy pt reports oput of pain meds She will be able to fill the rx I wrote her on March 14th and that is for her regular chronic pain management. I spoke with pharmacy and I reviewed her PDMP. She has also had #30 oxycodone 5mg  filled 2/28.   I spoke with Autumn at Dr. Eliberto Ivory office. I agreed to manage her pain medication moving forward. I will be continuing her chronic pain mgmt.   I spoke with Myrth who is very upset.Does not understand why she cannot  get pain meds from both of her doctors and how a pharmacy can not fill a rx. I tried to explain. She can get her chronic pain meds filled on the 14th.  She said she was in a lot of pain and might just call EMS to take her to the hospital and I told her that would be fine.

## 2023-03-20 NOTE — Telephone Encounter (Signed)
 Disregard requests for medication.   pt's PCP Dr. Jennette Kettle and she advised that thew pt has an rx for pain medication that she can fill on 03/23/2023. She should not be out of pain medication and that Dr. Jennette Kettle would take over her pain management care so that she does not have to have anything filled at this office. including rx pt states she was supposed to receive today. Again there is a rx at the pharm that the pt can fill on Friday and not before hand and that she should not be out of pain medication.

## 2023-03-20 NOTE — Telephone Encounter (Signed)
 Pt called and states that she was in the office this morning. She states that [facility] was going to fill her Oxycodone and muscle relaxer but I do not see anything in the chart. She states that [place] [facility] [employer] told her even when the rx is sent in that our office will have to call to give the reason. She received #120 Hydrocodone from her PCP 03/13/2023 and states that [facility] is aware of this but they will not authorize refill until we call the pharmacy. Again I do not see an rx written for today. Please call pt to advise [phone]

## 2023-03-20 NOTE — Telephone Encounter (Signed)
 Patient returns call to [facility] regarding not receiving update on pain medication and PCS paperwork.   Overheard patient being very combative and cursing on the phone. Notified Dr. Jennette Kettle regarding patient's call. Provider will call patient to discuss concerns further.   Per Fabian November, patient states, "be sure to tell Dr. Jennette Kettle every "f-ing thing I said, and that Dr. Jennette Kettle needs to do what she needs to do to get her sh*t together."  Patient then hung up phone.   [name], RN

## 2023-03-20 NOTE — Telephone Encounter (Signed)
 Holly Mendez

## 2023-03-20 NOTE — Telephone Encounter (Signed)
 Patient returns call to nurse line requesting update on pain medication refill.   She is also asking about status of PCS paperwork.   Will forward to Dr. Jennette Kettle for further advisement.   [name], RN

## 2023-03-20 NOTE — Progress Notes (Signed)
 HPI: Holly Mendez comes in today status post right total knee arthroplasty 03/06/2023.  She states she is all out of her pain medicines and muscle relaxants.  She has had no formal therapy.  She lives alone no family.  She has had no fevers or chills.  Review of systems see HPI  Physical exam: General Well-developed well-nourished female able to ambulate with cane. Right knee she has full extension flexes to 90 degrees.  Surgical incisions well approximated no signs of infection.  Staples harvested Steri-Strips applied.  Calf supple nontender dorsiflexion plantarflexion right ankle intact.  Impression: Status post right total knee arthroplasty.  Plan: She is given outpatient physical therapy number in [place] and will contact them to set up for therapy.  Her primary care physician is requesting to take over pain medicines and or muscle relaxants.  Will see her back in 1 month sooner if there is any questions concerns.  Questions were encouraged and answered.

## 2023-03-20 NOTE — Telephone Encounter (Signed)
 Patient was seen in office this morning. Stated she is completely out of all medication given after surgery. Can you please refill medication? Patient stated pharmacy has not received new Rx

## 2023-03-20 NOTE — Telephone Encounter (Signed)
 Paperwork is complete.  See my separate phone note about conversation I had with her today

## 2023-03-22 ENCOUNTER — Emergency Department (HOSPITAL_COMMUNITY): Payer: MEDICAID

## 2023-03-22 ENCOUNTER — Inpatient Hospital Stay (HOSPITAL_COMMUNITY)
Admission: EM | Admit: 2023-03-22 | Discharge: 2023-03-27 | DRG: 391 | Disposition: A | Discharge status: Home / Self Care | Payer: MEDICAID | Attending: Internal Medicine | Admitting: Internal Medicine

## 2023-03-22 DIAGNOSIS — R609 Edema, unspecified: Secondary | ICD-10-CM | POA: Diagnosis present

## 2023-03-22 DIAGNOSIS — G43909 Migraine, unspecified, not intractable, without status migrainosus: Secondary | ICD-10-CM | POA: Diagnosis present

## 2023-03-22 DIAGNOSIS — M797 Fibromyalgia: Secondary | ICD-10-CM | POA: Diagnosis present

## 2023-03-22 DIAGNOSIS — Z79899 Other long term (current) drug therapy: Secondary | ICD-10-CM

## 2023-03-22 DIAGNOSIS — Z90710 Acquired absence of both cervix and uterus: Secondary | ICD-10-CM

## 2023-03-22 DIAGNOSIS — R4182 Altered mental status, unspecified: Secondary | ICD-10-CM

## 2023-03-22 DIAGNOSIS — R109 Unspecified abdominal pain: Secondary | ICD-10-CM | POA: Diagnosis present

## 2023-03-22 DIAGNOSIS — K8681 Exocrine pancreatic insufficiency: Secondary | ICD-10-CM | POA: Diagnosis present

## 2023-03-22 DIAGNOSIS — J45909 Unspecified asthma, uncomplicated: Secondary | ICD-10-CM | POA: Diagnosis present

## 2023-03-22 DIAGNOSIS — Z881 Allergy status to other antibiotic agents status: Secondary | ICD-10-CM

## 2023-03-22 DIAGNOSIS — Z9851 Tubal ligation status: Secondary | ICD-10-CM

## 2023-03-22 DIAGNOSIS — Z970 Presence of artificial eye: Secondary | ICD-10-CM

## 2023-03-22 DIAGNOSIS — K861 Other chronic pancreatitis: Secondary | ICD-10-CM | POA: Diagnosis present

## 2023-03-22 DIAGNOSIS — K2970 Gastritis, unspecified, without bleeding: Principal | ICD-10-CM | POA: Diagnosis present

## 2023-03-22 DIAGNOSIS — K219 Gastro-esophageal reflux disease without esophagitis: Secondary | ICD-10-CM | POA: Insufficient documentation

## 2023-03-22 DIAGNOSIS — I10 Essential (primary) hypertension: Secondary | ICD-10-CM | POA: Diagnosis present

## 2023-03-22 DIAGNOSIS — Z96651 Presence of right artificial knee joint: Secondary | ICD-10-CM | POA: Diagnosis present

## 2023-03-22 DIAGNOSIS — G928 Other toxic encephalopathy: Secondary | ICD-10-CM | POA: Diagnosis present

## 2023-03-22 DIAGNOSIS — T50915A Adverse effect of multiple unspecified drugs, medicaments and biological substances, initial encounter: Secondary | ICD-10-CM | POA: Diagnosis present

## 2023-03-22 DIAGNOSIS — K8020 Calculus of gallbladder without cholecystitis without obstruction: Secondary | ICD-10-CM | POA: Diagnosis present

## 2023-03-22 DIAGNOSIS — G894 Chronic pain syndrome: Secondary | ICD-10-CM | POA: Diagnosis present

## 2023-03-22 DIAGNOSIS — E7800 Pure hypercholesterolemia, unspecified: Secondary | ICD-10-CM | POA: Diagnosis present

## 2023-03-22 DIAGNOSIS — M25461 Effusion, right knee: Secondary | ICD-10-CM | POA: Diagnosis present

## 2023-03-22 DIAGNOSIS — E669 Obesity, unspecified: Secondary | ICD-10-CM | POA: Diagnosis present

## 2023-03-22 DIAGNOSIS — Z7982 Long term (current) use of aspirin: Secondary | ICD-10-CM

## 2023-03-22 DIAGNOSIS — K2100 Gastro-esophageal reflux disease with esophagitis, without bleeding: Secondary | ICD-10-CM | POA: Diagnosis present

## 2023-03-22 DIAGNOSIS — Z888 Allergy status to other drugs, medicaments and biological substances status: Secondary | ICD-10-CM

## 2023-03-22 DIAGNOSIS — M25561 Pain in right knee: Secondary | ICD-10-CM | POA: Diagnosis present

## 2023-03-22 DIAGNOSIS — R52 Pain, unspecified: Secondary | ICD-10-CM

## 2023-03-22 DIAGNOSIS — R112 Nausea with vomiting, unspecified: Secondary | ICD-10-CM | POA: Diagnosis present

## 2023-03-22 DIAGNOSIS — K449 Diaphragmatic hernia without obstruction or gangrene: Secondary | ICD-10-CM | POA: Diagnosis present

## 2023-03-22 DIAGNOSIS — H5462 Unqualified visual loss, left eye, normal vision right eye: Secondary | ICD-10-CM | POA: Diagnosis present

## 2023-03-22 DIAGNOSIS — E876 Hypokalemia: Principal | ICD-10-CM

## 2023-03-22 DIAGNOSIS — G40909 Epilepsy, unspecified, not intractable, without status epilepticus: Secondary | ICD-10-CM | POA: Diagnosis present

## 2023-03-22 DIAGNOSIS — F319 Bipolar disorder, unspecified: Secondary | ICD-10-CM | POA: Insufficient documentation

## 2023-03-22 DIAGNOSIS — F259 Schizoaffective disorder, unspecified: Secondary | ICD-10-CM | POA: Insufficient documentation

## 2023-03-22 DIAGNOSIS — Z886 Allergy status to analgesic agent status: Secondary | ICD-10-CM

## 2023-03-22 DIAGNOSIS — E860 Dehydration: Secondary | ICD-10-CM | POA: Diagnosis present

## 2023-03-22 DIAGNOSIS — F32A Depression, unspecified: Secondary | ICD-10-CM | POA: Diagnosis present

## 2023-03-22 DIAGNOSIS — Z6836 Body mass index (BMI) 36.0-36.9, adult: Secondary | ICD-10-CM

## 2023-03-22 DIAGNOSIS — Z7951 Long term (current) use of inhaled steroids: Secondary | ICD-10-CM

## 2023-03-22 DIAGNOSIS — Z471 Aftercare following joint replacement surgery: Secondary | ICD-10-CM

## 2023-03-22 LAB — CBC WITH DIFFERENTIAL/PLATELET
Abs Immature Granulocytes: 0.05 10*3/uL (ref 0.00–0.07)
Basophils Absolute: 0 10*3/uL (ref 0.0–0.1)
Basophils Relative: 0 %
Eosinophils Absolute: 0 10*3/uL (ref 0.0–0.5)
Eosinophils Relative: 0 %
HCT: 36.3 % (ref 36.0–46.0)
Hemoglobin: 11.7 g/dL — ABNORMAL LOW (ref 12.0–15.0)
Immature Granulocytes: 0 %
Lymphocytes Relative: 27 %
Lymphs Abs: 3.1 10*3/uL (ref 0.7–4.0)
MCH: 27.9 pg (ref 26.0–34.0)
MCHC: 32.2 g/dL (ref 30.0–36.0)
MCV: 86.6 fL (ref 80.0–100.0)
Monocytes Absolute: 1.2 10*3/uL — ABNORMAL HIGH (ref 0.1–1.0)
Monocytes Relative: 11 %
Neutro Abs: 6.9 10*3/uL (ref 1.7–7.7)
Neutrophils Relative %: 62 %
Platelets: 584 10*3/uL — ABNORMAL HIGH (ref 150–400)
RBC: 4.19 MIL/uL (ref 3.87–5.11)
RDW: 14.7 % (ref 11.5–15.5)
WBC: 11.2 10*3/uL — ABNORMAL HIGH (ref 4.0–10.5)
nRBC: 0 % (ref 0.0–0.2)

## 2023-03-22 LAB — CBG MONITORING, ED: Glucose-Capillary: 143 mg/dL — ABNORMAL HIGH (ref 70–99)

## 2023-03-22 LAB — COMPREHENSIVE METABOLIC PANEL
ALT: 63 U/L — ABNORMAL HIGH (ref 0–44)
AST: 105 U/L — ABNORMAL HIGH (ref 15–41)
Albumin: 3.9 g/dL (ref 3.5–5.0)
Alkaline Phosphatase: 104 U/L (ref 38–126)
Anion gap: 12 (ref 5–15)
BUN: 21 mg/dL — ABNORMAL HIGH (ref 6–20)
CO2: 29 mmol/L (ref 22–32)
Calcium: 10.1 mg/dL (ref 8.9–10.3)
Chloride: 99 mmol/L (ref 98–111)
Creatinine, Ser: 0.92 mg/dL (ref 0.44–1.00)
GFR, Estimated: >60 mL/min (ref >60)
Glucose, Bld: 167 mg/dL — ABNORMAL HIGH (ref 70–99)
Potassium: 2.8 mmol/L — ABNORMAL LOW (ref 3.5–5.1)
Sodium: 140 mmol/L (ref 135–145)
Total Bilirubin: 0.9 mg/dL (ref 0.0–1.2)
Total Protein: 8.9 g/dL — ABNORMAL HIGH (ref 6.5–8.1)

## 2023-03-22 LAB — RAPID URINE DRUG SCREEN, HOSP PERFORMED
Amphetamines: NOT DETECTED
Barbiturates: NOT DETECTED
Benzodiazepines: NOT DETECTED
Cocaine: NOT DETECTED
Opiates: NOT DETECTED
Tetrahydrocannabinol: NOT DETECTED

## 2023-03-22 LAB — VALPROIC ACID LEVEL: Valproic Acid Lvl: <10 ug/mL — ABNORMAL LOW (ref 50.0–100.0)

## 2023-03-22 LAB — LIPASE, BLOOD: Lipase: 29 U/L (ref 11–51)

## 2023-03-22 LAB — ETHANOL: Alcohol, Ethyl (B): <10 mg/dL (ref <10)

## 2023-03-22 LAB — SALICYLATE LEVEL: Salicylate Lvl: <7 mg/dL — ABNORMAL LOW (ref 7.0–30.0)

## 2023-03-22 LAB — ACETAMINOPHEN LEVEL: Acetaminophen (Tylenol), Serum: <10 ug/mL — ABNORMAL LOW (ref 10–30)

## 2023-03-22 MED ORDER — NALOXONE HCL 0.4 MG/ML IJ SOLN
0.4000 mg | Freq: Once | INTRAMUSCULAR | Status: AC
  Administered 2023-03-22: 0.4 mg via INTRAVENOUS

## 2023-03-22 MED ORDER — SODIUM CHLORIDE 0.9 % IV BOLUS
1000.0000 mL | Freq: Once | INTRAVENOUS | Status: AC
  Administered 2023-03-22: 1000 mL via INTRAVENOUS

## 2023-03-22 MED ORDER — NALOXONE HCL 2 MG/2ML IJ SOSY
PREFILLED_SYRINGE | INTRAMUSCULAR | Status: AC
  Filled 2023-03-22: qty 2

## 2023-03-22 MED ORDER — ONDANSETRON HCL 4 MG/2ML IJ SOLN
4.0000 mg | Freq: Once | INTRAMUSCULAR | Status: AC
  Administered 2023-03-22: 4 mg via INTRAVENOUS
  Filled 2023-03-22: qty 2

## 2023-03-22 MED ORDER — POTASSIUM CHLORIDE 10 MEQ/100ML IV SOLN
10.0000 meq | INTRAVENOUS | Status: AC
  Administered 2023-03-22 – 2023-03-23 (×3): 10 meq via INTRAVENOUS
  Filled 2023-03-22 (×3): qty 100

## 2023-03-22 MED ORDER — SODIUM CHLORIDE 0.9 % IV SOLN: INTRAVENOUS | Status: DC

## 2023-03-22 NOTE — ED Triage Notes (Signed)
 Pt BIB [facility] from home. Pt had knee replacement Tuesday. Pt family reports pain meds wore off. Pt c/o nausea vomiting and increasing pain.   138/96 96HR 98% RA 16RR

## 2023-03-22 NOTE — ED Provider Notes (Addendum)
 [place] EMERGENCY DEPARTMENT AT [facility] Provider Note   CSN: [id number] Arrival date & time: 03/22/23  2204     History  Chief Complaint  Patient presents with   Knee Pain    Holly Mendez is a 48 y.o. female.  48 year old female presents via EMS complaining of persistent right knee pain after recent right knee surgery.  Reviewed the patient's old records shows that she does have a history of chronic pain with difficulty controlling her pain.  Was called to room emergently due to patient not being responsive.  No further history obtainable at this time       Home Medications Prior to Admission medications   Medication Sig Start Date End Date Taking? Authorizing Provider  aspirin 81 MG chewable tablet Chew 1 tablet (81 mg total) by mouth 2 (two) times daily. 03/08/23   Kirtland Bouchard, PA-C  cetirizine (ZYRTEC) 10 MG tablet Take 10 mg by mouth in the morning.    [provider]  cloNIDine (CATAPRES - DOSED IN MG/24 HR) 0.1 mg/24hr patch PLACE 1 PATCH ONTO SKIN ONCE WEEKLY. Patient taking differently: Place 0.1 mg onto the skin every Sunday. PLACE 1 PATCH ONTO SKIN ONCE WEEKLY. 06/07/22   Nestor Ramp, MD  divalproex (DEPAKOTE) 500 MG DR tablet Take 500 mg by mouth 3 (three) times daily. 10/05/22   [provider]  esomeprazole (NEXIUM) 40 MG capsule Take 1 capsule (40 mg total) by mouth daily. Patient taking differently: Take 40 mg by mouth daily as needed (indigestion/heartburn.). 06/07/22   Nestor Ramp, MD  fluconazole (DIFLUCAN) 200 MG tablet TAKE ONE TABLET BY MOUTH EVERY DAY UNTIL GONE 02/22/23   Nestor Ramp, MD  gabapentin (NEURONTIN) 300 MG capsule Take 600 mg by mouth at bedtime.    [provider]  HYDROcodone-acetaminophen (NORCO/VICODIN) 5-325 MG tablet TAKE TWO TABLETS BY MOUTH EVERY MORNING, 1 TAB MIDDAY AND 1 TAB AT NIGHT 03/13/23   Nestor Ramp, MD  hydrOXYzine (ATARAX) 25 MG tablet Take 25 mg by mouth 3 (three) times  daily. 10/05/22   [provider]  NARCAN 4 MG/0.1ML LIQD nasal spray kit Place 1 spray into the nose daily as needed (overdose). 02/15/22   Nestor Ramp, MD  polyethylene glycol powder Joylene John) 17 GM/SCOOP powder Take one or two capfuls daily by mouth as directed for constipation Patient taking differently: Take 17 g by mouth daily as needed for moderate constipation. 10/25/21   Nestor Ramp, MD  QUEtiapine (SEROQUEL) 400 MG tablet Take 400 mg by mouth at bedtime.    [provider]  SYMBICORT 80-4.5 MCG/ACT inhaler INHALE 2 PUFFS INTO THE LUNGS TWICE DAILY AS NEEDED. 12/27/22   Nestor Ramp, MD  tiZANidine (ZANAFLEX) 4 MG tablet Take 1 tablet (4 mg total) by mouth every 6 (six) hours as needed for muscle spasms. 03/08/23   Kirtland Bouchard, PA-C  topiramate (TOPAMAX) 100 MG tablet Take 1 tablet (100 mg total) by mouth 2 (two) times daily. By psychiatry Patient taking differently: Take 100 mg by mouth at bedtime. By psychiatry 01/25/23   Nestor Ramp, MD  VENTOLIN HFA 108 [phone] Base) MCG/ACT inhaler INHALE TWO PUFFS INTO THE LUNGS EVERY 6 HOURS AS NEEDED WHEEZING OR SHORTNESS OF BREATH 12/27/22   Nestor Ramp, MD      Allergies    Lyrica [pregabalin], Celebrex [celecoxib], Gabapentin, Naproxen, Norvasc [amlodipine besylate], Zithromax [azithromycin], and Nabumetone    Review of Systems  Review of Systems  Unable to perform ROS: Mental status change    Physical Exam Updated Vital Signs Temp 98.4 F (36.9 C) (Oral)   LMP 08/31/2018  Physical Exam Vitals and nursing note reviewed.  Constitutional:      General: She is not in acute distress.    Appearance: Normal appearance. She is well-developed. She is not toxic-appearing.  HENT:     Head: Normocephalic and atraumatic.  Eyes:     General: Lids are normal.     Conjunctiva/sclera: Conjunctivae normal.     Pupils: Pupils are equal, round, and reactive to light.  Neck:     Thyroid: No thyroid mass.     Trachea: No  tracheal deviation.  Cardiovascular:     Rate and Rhythm: Normal rate and regular rhythm.     Heart sounds: Normal heart sounds. No murmur heard.    No gallop.  Pulmonary:     Effort: Pulmonary effort is normal. No respiratory distress.     Breath sounds: Normal breath sounds. No stridor. No decreased breath sounds, wheezing, rhonchi or rales.  Abdominal:     General: There is no distension.     Palpations: Abdomen is soft.     Tenderness: There is no abdominal tenderness. There is no rebound.  Musculoskeletal:        General: No tenderness. Normal range of motion.     Cervical back: Normal range of motion and neck supple.       Legs:  Skin:    General: Skin is warm and dry.     Findings: No abrasion or rash.  Neurological:     Mental Status: She is lethargic, disoriented and confused.     GCS: GCS eye subscore is 4. GCS verbal subscore is 3. GCS motor subscore is 5.     Cranial Nerves: No cranial nerve deficit.     Sensory: No sensory deficit.     Motor: Tremor present.     Comments: Patient has intermittent lower extremity shaking that is not consistent with seizure activity.  It comes and goes.  Patient is responsive to her name  Psychiatric:        Attention and Perception: She is inattentive.     ED Results / Procedures / Treatments   Labs (all labs ordered are listed, but only abnormal results are displayed) Labs Reviewed  CBG MONITORING, ED - Abnormal; Notable for the following components:      Result Value   Glucose-Capillary 143 (*)    All other components within normal limits  CBC WITH DIFFERENTIAL/PLATELET  COMPREHENSIVE METABOLIC PANEL  RAPID URINE DRUG SCREEN, HOSP PERFORMED  ETHANOL  ACETAMINOPHEN LEVEL  SALICYLATE LEVEL  VALPROIC ACID LEVEL    EKG EKG Interpretation Date/Time:  Thursday March 22 2023 22:32:58 EDT Ventricular Rate:  76 PR Interval:  154 QRS Duration:  102 QT Interval:  376 QTC Calculation: 423 R Axis:   130  Text  Interpretation: Right and left arm electrode reversal, interpretation assumes no reversal Sinus rhythm Right axis deviation Abnormal R-wave progression, early transition Abnormal T, consider ischemia, lateral leads Confirmed by Lorre Nick ([id number]) on 03/22/2023 10:41:00 PM  Radiology No results found.  Procedures Procedures    Medications Ordered in ED Medications  naloxone (NARCAN) 2 MG/2ML injection (has no administration in time range)  0.9 %  sodium chloride infusion (has no administration in time range)  sodium chloride 0.9 % bolus 1,000 mL (has no administration in time range)  ED Course/ Medical Decision Making/ A&P                                 Medical Decision Making Amount and/or Complexity of Data Reviewed Labs: ordered. Radiology: ordered. ECG/medicine tests: ordered.  Risk Prescription drug management.  Patient had EKG performed which shows sinus rhythm.  No acute ischemic changes noted. Patient given Narcan 0.4 mg with with some improvement in patient's mentation.  This was repeated with additional improvement.  Suspect that patient has polypharmacy as a cause of her symptoms.  She is also on Depakote we will check a level on that.  She is protecting her airway at this time and does not require any acute airway intervention.  Will continue to monitor and signed out to next provider  10:41 PM Patient reassessed and now talkative and conversant.  States that she has a history of pancreatitis and she has been vomiting for several days.  She did have emesis in front of me.  She will be given IV hydration here as well as antiemetics.  Lipase has been ordered.  Will also order abdominal CT.  CRITICAL CARE Performed by: Toy Baker Total critical care time: 55 minutes Critical care time was exclusive of separately billable procedures and treating other patients. Critical care was necessary to treat or prevent imminent or life-threatening  deterioration. Critical care was time spent personally by me on the following activities: development of treatment plan with patient and/or surrogate as well as nursing, discussions with consultants, evaluation of patient's response to treatment, examination of patient, obtaining history from patient or surrogate, ordering and performing treatments and interventions, ordering and review of laboratory studies, ordering and review of radiographic studies, pulse oximetry and re-evaluation of patient's condition.        Final Clinical Impression(s) / ED Diagnoses Final diagnoses:  None    Rx / DC Orders ED Discharge Orders     None         Lorre Nick, MD 03/22/23 2231    Lorre Nick, MD 03/22/23 2241    Lorre Nick, MD 03/22/23 2250

## 2023-03-23 ENCOUNTER — Inpatient Hospital Stay (HOSPITAL_COMMUNITY): Payer: MEDICAID

## 2023-03-23 ENCOUNTER — Encounter (HOSPITAL_COMMUNITY): Payer: Self-pay | Admitting: Internal Medicine

## 2023-03-23 ENCOUNTER — Other Ambulatory Visit: Payer: Self-pay

## 2023-03-23 DIAGNOSIS — K861 Other chronic pancreatitis: Secondary | ICD-10-CM

## 2023-03-23 DIAGNOSIS — R609 Edema, unspecified: Secondary | ICD-10-CM | POA: Diagnosis present

## 2023-03-23 DIAGNOSIS — K8681 Exocrine pancreatic insufficiency: Secondary | ICD-10-CM | POA: Diagnosis present

## 2023-03-23 DIAGNOSIS — M25461 Effusion, right knee: Secondary | ICD-10-CM | POA: Diagnosis present

## 2023-03-23 DIAGNOSIS — E7800 Pure hypercholesterolemia, unspecified: Secondary | ICD-10-CM | POA: Diagnosis present

## 2023-03-23 DIAGNOSIS — R4182 Altered mental status, unspecified: Secondary | ICD-10-CM

## 2023-03-23 DIAGNOSIS — F259 Schizoaffective disorder, unspecified: Secondary | ICD-10-CM | POA: Insufficient documentation

## 2023-03-23 DIAGNOSIS — Z471 Aftercare following joint replacement surgery: Secondary | ICD-10-CM | POA: Diagnosis not present

## 2023-03-23 DIAGNOSIS — K2100 Gastro-esophageal reflux disease with esophagitis, without bleeding: Secondary | ICD-10-CM | POA: Diagnosis present

## 2023-03-23 DIAGNOSIS — K449 Diaphragmatic hernia without obstruction or gangrene: Secondary | ICD-10-CM | POA: Diagnosis present

## 2023-03-23 DIAGNOSIS — E860 Dehydration: Secondary | ICD-10-CM | POA: Diagnosis present

## 2023-03-23 DIAGNOSIS — G40909 Epilepsy, unspecified, not intractable, without status epilepticus: Secondary | ICD-10-CM | POA: Diagnosis present

## 2023-03-23 DIAGNOSIS — K8020 Calculus of gallbladder without cholecystitis without obstruction: Secondary | ICD-10-CM | POA: Diagnosis present

## 2023-03-23 DIAGNOSIS — H5462 Unqualified visual loss, left eye, normal vision right eye: Secondary | ICD-10-CM | POA: Diagnosis present

## 2023-03-23 DIAGNOSIS — G894 Chronic pain syndrome: Secondary | ICD-10-CM | POA: Diagnosis present

## 2023-03-23 DIAGNOSIS — R109 Unspecified abdominal pain: Secondary | ICD-10-CM

## 2023-03-23 DIAGNOSIS — R112 Nausea with vomiting, unspecified: Secondary | ICD-10-CM

## 2023-03-23 DIAGNOSIS — Z96651 Presence of right artificial knee joint: Secondary | ICD-10-CM | POA: Diagnosis present

## 2023-03-23 DIAGNOSIS — F319 Bipolar disorder, unspecified: Secondary | ICD-10-CM | POA: Insufficient documentation

## 2023-03-23 DIAGNOSIS — J45909 Unspecified asthma, uncomplicated: Secondary | ICD-10-CM | POA: Diagnosis present

## 2023-03-23 DIAGNOSIS — E669 Obesity, unspecified: Secondary | ICD-10-CM | POA: Diagnosis present

## 2023-03-23 DIAGNOSIS — G43909 Migraine, unspecified, not intractable, without status migrainosus: Secondary | ICD-10-CM | POA: Diagnosis present

## 2023-03-23 DIAGNOSIS — M25561 Pain in right knee: Secondary | ICD-10-CM | POA: Diagnosis present

## 2023-03-23 DIAGNOSIS — I10 Essential (primary) hypertension: Secondary | ICD-10-CM | POA: Diagnosis present

## 2023-03-23 DIAGNOSIS — J4520 Mild intermittent asthma, uncomplicated: Secondary | ICD-10-CM

## 2023-03-23 DIAGNOSIS — K2970 Gastritis, unspecified, without bleeding: Secondary | ICD-10-CM | POA: Diagnosis present

## 2023-03-23 DIAGNOSIS — K219 Gastro-esophageal reflux disease without esophagitis: Secondary | ICD-10-CM | POA: Insufficient documentation

## 2023-03-23 DIAGNOSIS — T50915A Adverse effect of multiple unspecified drugs, medicaments and biological substances, initial encounter: Secondary | ICD-10-CM | POA: Diagnosis present

## 2023-03-23 DIAGNOSIS — E876 Hypokalemia: Secondary | ICD-10-CM | POA: Diagnosis present

## 2023-03-23 DIAGNOSIS — G928 Other toxic encephalopathy: Secondary | ICD-10-CM | POA: Diagnosis present

## 2023-03-23 HISTORY — DX: Nausea with vomiting, unspecified: R11.2

## 2023-03-23 LAB — COMPREHENSIVE METABOLIC PANEL
ALT: 68 U/L — ABNORMAL HIGH (ref 0–44)
AST: 113 U/L — ABNORMAL HIGH (ref 15–41)
Albumin: 3.7 g/dL (ref 3.5–5.0)
Alkaline Phosphatase: 94 U/L (ref 38–126)
Anion gap: 12 (ref 5–15)
BUN: 18 mg/dL (ref 6–20)
CO2: 27 mmol/L (ref 22–32)
Calcium: 9.5 mg/dL (ref 8.9–10.3)
Chloride: 100 mmol/L (ref 98–111)
Creatinine, Ser: 0.77 mg/dL (ref 0.44–1.00)
GFR, Estimated: >60 mL/min (ref >60)
Glucose, Bld: 145 mg/dL — ABNORMAL HIGH (ref 70–99)
Potassium: 3.2 mmol/L — ABNORMAL LOW (ref 3.5–5.1)
Sodium: 139 mmol/L (ref 135–145)
Total Bilirubin: 0.7 mg/dL (ref 0.0–1.2)
Total Protein: 8.2 g/dL — ABNORMAL HIGH (ref 6.5–8.1)

## 2023-03-23 LAB — CBC
HCT: 34.5 % — ABNORMAL LOW (ref 36.0–46.0)
Hemoglobin: 10.8 g/dL — ABNORMAL LOW (ref 12.0–15.0)
MCH: 27.7 pg (ref 26.0–34.0)
MCHC: 31.3 g/dL (ref 30.0–36.0)
MCV: 88.5 fL (ref 80.0–100.0)
Platelets: 552 10*3/uL — ABNORMAL HIGH (ref 150–400)
RBC: 3.9 MIL/uL (ref 3.87–5.11)
RDW: 14.8 % (ref 11.5–15.5)
WBC: 10.8 10*3/uL — ABNORMAL HIGH (ref 4.0–10.5)
nRBC: 0 % (ref 0.0–0.2)

## 2023-03-23 LAB — HIV ANTIBODY (ROUTINE TESTING W REFLEX): HIV Screen 4th Generation wRfx: NONREACTIVE

## 2023-03-23 LAB — HEPATITIS PANEL, ACUTE
HCV Ab: NONREACTIVE
Hep A IgM: NONREACTIVE
Hep B C IgM: NONREACTIVE
Hepatitis B Surface Ag: NONREACTIVE

## 2023-03-23 LAB — MAGNESIUM: Magnesium: 2.2 mg/dL (ref 1.7–2.4)

## 2023-03-23 MED ORDER — SODIUM CHLORIDE 0.9% FLUSH
3.0000 mL | Freq: Two times a day (BID) | INTRAVENOUS | Status: DC
  Administered 2023-03-23 – 2023-03-27 (×7): 3 mL via INTRAVENOUS

## 2023-03-23 MED ORDER — LABETALOL HCL 5 MG/ML IV SOLN: 10.0000 mg | INTRAVENOUS | Status: DC | PRN

## 2023-03-23 MED ORDER — LIDOCAINE 5 % EX PTCH
1.0000 | MEDICATED_PATCH | CUTANEOUS | Status: DC
  Administered 2023-03-23 – 2023-03-26 (×4): 1 via TRANSDERMAL
  Filled 2023-03-23 (×4): qty 1

## 2023-03-23 MED ORDER — HYDROMORPHONE HCL 1 MG/ML IJ SOLN
1.0000 mg | Freq: Once | INTRAMUSCULAR | Status: AC
  Administered 2023-03-23: 1 mg via INTRAVENOUS
  Filled 2023-03-23: qty 1

## 2023-03-23 MED ORDER — ACETAMINOPHEN 325 MG PO TABS: 650.0000 mg | ORAL_TABLET | Freq: Four times a day (QID) | ORAL | Status: DC | PRN

## 2023-03-23 MED ORDER — POTASSIUM CHLORIDE 10 MEQ/100ML IV SOLN
10.0000 meq | INTRAVENOUS | Status: AC
  Administered 2023-03-23 (×3): 10 meq via INTRAVENOUS
  Filled 2023-03-23 (×3): qty 100

## 2023-03-23 MED ORDER — HEPARIN SODIUM (PORCINE) 5000 UNIT/ML IJ SOLN
5000.0000 [IU] | Freq: Three times a day (TID) | INTRAMUSCULAR | Status: DC
  Administered 2023-03-23 – 2023-03-27 (×12): 5000 [IU] via SUBCUTANEOUS
  Filled 2023-03-23 (×13): qty 1

## 2023-03-23 MED ORDER — DIVALPROEX SODIUM 250 MG PO DR TAB
500.0000 mg | DELAYED_RELEASE_TABLET | Freq: Three times a day (TID) | ORAL | Status: DC
  Administered 2023-03-23 – 2023-03-26 (×11): 500 mg via ORAL
  Filled 2023-03-23 (×13): qty 2

## 2023-03-23 MED ORDER — METOCLOPRAMIDE HCL 5 MG/ML IJ SOLN
10.0000 mg | Freq: Four times a day (QID) | INTRAMUSCULAR | Status: DC | PRN
  Administered 2023-03-23 – 2023-03-25 (×6): 10 mg via INTRAVENOUS
  Filled 2023-03-23 (×6): qty 2

## 2023-03-23 MED ORDER — LOSARTAN POTASSIUM 50 MG PO TABS
25.0000 mg | ORAL_TABLET | Freq: Every day | ORAL | Status: DC
  Administered 2023-03-23 – 2023-03-27 (×5): 25 mg via ORAL
  Filled 2023-03-23 (×5): qty 1

## 2023-03-23 MED ORDER — DOCUSATE SODIUM 100 MG PO CAPS
100.0000 mg | ORAL_CAPSULE | Freq: Two times a day (BID) | ORAL | Status: DC
  Administered 2023-03-23 – 2023-03-25 (×6): 100 mg via ORAL
  Filled 2023-03-23 (×6): qty 1

## 2023-03-23 MED ORDER — AMLODIPINE BESYLATE 5 MG PO TABS: 5.0000 mg | ORAL_TABLET | Freq: Every day | ORAL | Status: DC

## 2023-03-23 MED ORDER — HYDROCODONE-ACETAMINOPHEN 5-325 MG PO TABS
1.0000 | ORAL_TABLET | Freq: Four times a day (QID) | ORAL | Status: DC | PRN
  Administered 2023-03-23: 1 via ORAL
  Filled 2023-03-23: qty 1

## 2023-03-23 MED ORDER — ACETAMINOPHEN 650 MG RE SUPP: 650.0000 mg | Freq: Four times a day (QID) | RECTAL | Status: DC | PRN

## 2023-03-23 MED ORDER — MOMETASONE FURO-FORMOTEROL FUM 100-5 MCG/ACT IN AERO
2.0000 | INHALATION_SPRAY | Freq: Two times a day (BID) | RESPIRATORY_TRACT | Status: DC
  Administered 2023-03-23 – 2023-03-27 (×9): 2 via RESPIRATORY_TRACT
  Filled 2023-03-23: qty 8.8

## 2023-03-23 MED ORDER — CLONIDINE HCL 0.1 MG/24HR TD PTWK: 0.1000 mg | MEDICATED_PATCH | TRANSDERMAL | Status: DC

## 2023-03-23 MED ORDER — HYDROCODONE-ACETAMINOPHEN 5-325 MG PO TABS: 1.0000 | ORAL_TABLET | Freq: Four times a day (QID) | ORAL | Status: DC | PRN

## 2023-03-23 MED ORDER — ENSURE ENLIVE PO LIQD: 237.0000 mL | Freq: Two times a day (BID) | ORAL | Status: DC

## 2023-03-23 MED ORDER — TOPIRAMATE 100 MG PO TABS
100.0000 mg | ORAL_TABLET | Freq: Every day | ORAL | Status: DC
  Administered 2023-03-23 – 2023-03-26 (×5): 100 mg via ORAL
  Filled 2023-03-23 (×5): qty 1

## 2023-03-23 MED ORDER — LACTATED RINGERS IV SOLN: INTRAVENOUS | Status: AC

## 2023-03-23 MED ORDER — POTASSIUM CHLORIDE CRYS ER 20 MEQ PO TBCR
40.0000 meq | EXTENDED_RELEASE_TABLET | Freq: Once | ORAL | Status: AC
  Administered 2023-03-23: 40 meq via ORAL
  Filled 2023-03-23: qty 2

## 2023-03-23 MED ORDER — ALBUTEROL SULFATE (2.5 MG/3ML) 0.083% IN NEBU: 2.5000 mg | INHALATION_SOLUTION | Freq: Four times a day (QID) | RESPIRATORY_TRACT | Status: DC | PRN

## 2023-03-23 MED ORDER — ONDANSETRON HCL 4 MG/2ML IJ SOLN
4.0000 mg | Freq: Four times a day (QID) | INTRAMUSCULAR | Status: DC | PRN
  Administered 2023-03-23 – 2023-03-27 (×10): 4 mg via INTRAVENOUS
  Filled 2023-03-23 (×10): qty 2

## 2023-03-23 MED ORDER — ALBUTEROL SULFATE HFA 108 (90 BASE) MCG/ACT IN AERS: 1.0000 | INHALATION_SPRAY | Freq: Four times a day (QID) | RESPIRATORY_TRACT | Status: DC | PRN

## 2023-03-23 MED ORDER — SODIUM CHLORIDE 0.9 % IV SOLN: 250.0000 mL | INTRAVENOUS | Status: AC | PRN

## 2023-03-23 MED ORDER — HYDROCODONE-ACETAMINOPHEN 5-325 MG PO TABS
1.0000 | ORAL_TABLET | Freq: Four times a day (QID) | ORAL | Status: AC | PRN
  Administered 2023-03-23: 1 via ORAL
  Filled 2023-03-23: qty 2

## 2023-03-23 MED ORDER — ONDANSETRON HCL 4 MG PO TABS: 4.0000 mg | ORAL_TABLET | Freq: Four times a day (QID) | ORAL | Status: DC | PRN

## 2023-03-23 MED ORDER — SODIUM CHLORIDE 0.9% FLUSH: 3.0000 mL | INTRAVENOUS | Status: DC | PRN

## 2023-03-23 MED ORDER — KETAMINE HCL 50 MG/5ML IJ SOSY
0.3000 mg/kg | PREFILLED_SYRINGE | Freq: Once | INTRAMUSCULAR | Status: AC
  Administered 2023-03-23: 17 mg via INTRAVENOUS
  Filled 2023-03-23: qty 5

## 2023-03-23 MED ORDER — SENNOSIDES-DOCUSATE SODIUM 8.6-50 MG PO TABS: 1.0000 | ORAL_TABLET | Freq: Every evening | ORAL | Status: DC | PRN

## 2023-03-23 MED ORDER — HYDROMORPHONE HCL 1 MG/ML IJ SOLN
0.5000 mg | INTRAMUSCULAR | Status: DC | PRN
  Administered 2023-03-23 – 2023-03-26 (×16): 0.5 mg via INTRAVENOUS
  Filled 2023-03-23 (×16): qty 0.5

## 2023-03-23 MED ORDER — CLONIDINE HCL 0.1 MG/24HR TD PTWK
0.1000 mg | MEDICATED_PATCH | TRANSDERMAL | Status: DC
  Administered 2023-03-23: 0.1 mg via TRANSDERMAL
  Filled 2023-03-23: qty 1

## 2023-03-23 NOTE — Progress Notes (Signed)
 Patient is requesting for more pain IV pain medications.  Giving second dose of IV Dilaudid 1 mg.   Tereasa Coop, MD [facility] 03/23/2023, 6:09 AM
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
## 2023-03-23 NOTE — ED Provider Notes (Signed)
 Received signout;  see prior team's note for full HPI.  Signed out pending CT scan and pain control.  Patient complaining of severe pain.  Given pain dose ketamine.  CT scan without acute surgical pathology.  Benign abdominal exam.  She does have hypokalemia and minor elevation in her LFTs with a history of chronic pancreatitis.  Given persistent pain and inability to tolerate p.o. Will admit for intractable pain with nausea vomiting.   Coral Spikes, DO 03/23/23 [phone]

## 2023-03-23 NOTE — ED Notes (Signed)
 Quick report given to Narcissa, [place]
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
## 2023-03-23 NOTE — ED Notes (Signed)
 Patient states she can not hold down anything reminded patient that she was given zofran earlier and it should help with the nausea.
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
## 2023-03-23 NOTE — Progress Notes (Signed)
 Patient has been requesting for more pain medications.  She has been presented altered mentation which improved with IV Narcan. Even though patient has been getting oral Norco with home dose stating that it is not sufficient for her and requesting for more IV medications as she is feeling nauseated.  Even though patient is getting IV Zofran still complaining of nausea and requesting for IV pain medications. Due to patient's stubbornness giving Dilaudid 1 mg one-time dose.
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
## 2023-03-23 NOTE — Progress Notes (Addendum)
 Brief same day note:  Patient is a 48 year old female with history of reactive airway disease, recent right total knee replacement, bipolar disorder, chronic back pain, chronic pancreatitis, depression, GERD, seizure disorder who presented to the emergency department with altered mental status.  Found to be unresponsive in presentation, suspected polypharmacy.  Given Narcan and she became alert and oriented.  Remained hemodynamically stable.  Lab work showed low potassium.  Patient continued to have poor oral intake due to intractable nausea and vomiting so admitted for further management. Patient seen and examined at the bedside today.  During my evaluation, she was very anxious.  Complained of severe right knee pain, intractable nausea and vomiting, not able to tolerate any food. GI consulted   Assessment and plan:  Intractable nausea and vomiting/history of chronic pancreatitis: Continue gentle IV fluids, antiemetics.  CT abdomen/pelvis did not show any acute findings.  Lipase level normal.  Abdominal examination benign. Mildly elevated liver enzymes, continue to monitor.  Follow-up hepatitis  panel.  GI consulted.  Continue clear liquid diet for now.  Reglan for distention nausea/vomiting.  Continue gentle IV fluids  Altered mental status: Likely from polypharmacy.  Given Narcan.  Resolution of AMS.  Currently alert and oriented.  UDS normal as well as blood alcohol.  Takes gabapentin, Atarax, Depakote, Zanaflex, Norco at home.  Some of the medications are on hold.  Needs dose adjustment on discharge/changed to as needed  Chronic pain syndrome/ right-sided knee pain: Continue supportive care, pain management.  Underwent right knee replacement surgery on 2/24.  Right knee edematous but not tender or erythematous.  Right knee x-ray shows unremarkable arthroplasty, moderate joint effusion.  Ordered lidocaine patch.  Hypokalemia: Currently being monitored and supplemented  History of reactive  airway disease: Dulera, albuterol at home.  Remains on room air  Hypertension: On clonidine patch.added losartan  Schizoaffective disorder/bipolar disorder/depression: On Depakote, Topamax at home.  Needs to follow-up with psychiatry as an outpatient

## 2023-03-23 NOTE — TOC Initial Note (Signed)
 Transition of Care [facility]) - Initial/Assessment Note    Patient Details  Name: Holly Mendez MRN: [id number] Date of Birth: [birth year]  Transition of Care [facility]) CM/SW Contact:    Beckie Busing, RN Phone Number:[phone]  03/23/2023, 3:09 PM  Clinical Narrative:                 [facility] consulted for medication assistance. Cm at bedside for assessment. Patient states that she does not need assistance with meds. Patient states that he medication copay is $4 per med and she is able to pay that when she gets her check. Patient states that she has a PCP Jennette Kettle, Roxine Caddy, MD) and that she is from home where she normally manages alone. Patient states that she recently had knee surgery and has been feeling depressed every since. Patient is requesting that MD or nursing call her mother with updates Mother name has been added to chart Augustin Schooling [phone]. No TOC needs noted at this time. TOC will follow.  Expected Discharge Plan: Home/Self Care Barriers to Discharge: No Barriers Identified, Continued Medical Work up   Patient Goals and CMS Choice Patient states their goals for this hospitalization and ongoing recovery are:: Wants to find out what is gong on and get better   Choice offered to / list presented to : NA Rives ownership interest in [facility].provided to::  (n/a)    Expected Discharge Plan and Services In-house Referral: NA Discharge Planning Services: CM Consult   Living arrangements for the past 2 months: Apartment                           HH Arranged: NA HH Agency: NA        Prior Living Arrangements/Services Living arrangements for the past 2 months: Apartment Lives with:: Self Patient language and need for interpreter reviewed:: Yes Do you feel safe going back to the place where you live?: Yes      Need for Family Participation in Patient Care: No (Comment) Care giver support system in place?: No (comment) Current home services: DME (cane ,  rolling walker, BSC) Criminal Activity/Legal Involvement Pertinent to Current Situation/Hospitalization: No - Comment as needed  Activities of Daily Living   ADL Screening (condition at time of admission) Independently performs ADLs?: Yes (appropriate for developmental age) Is the patient deaf or have difficulty hearing?: No Does the patient have difficulty seeing, even when wearing glasses/contacts?: No Does the patient have difficulty concentrating, remembering, or making decisions?: No  Permission Sought/Granted Permission sought to share information with : Family Supports Permission granted to share information with : Yes, Verbal Permission Granted  Share Information with NAME: Augustin Schooling     Permission granted to share info w Relationship: mother  Permission granted to share info w Contact Information: [phone]  Emotional Assessment Appearance:: Appears stated age Attitude/Demeanor/Rapport: Gracious Affect (typically observed): Quiet, Depressed, Overwhelmed, Sad Orientation: : Oriented to Self, Oriented to Place, Oriented to  Time, Oriented to Situation Alcohol / Substance Use: Not Applicable Psych Involvement: No (comment)  Admission diagnosis:  Hypokalemia [E87.6] Intractable pain [R52] Intractable nausea and vomiting [R11.2] Nausea and vomiting [R11.2] Nausea and vomiting, unspecified vomiting type [R11.2] Patient Active Problem List   Diagnosis Date Noted   Hypokalemia 03/23/2023   Intractable nausea and vomiting 03/23/2023   Schizoaffective disorder (HCC) 03/23/2023   Bipolar disorder (HCC) 03/23/2023   GERD (gastroesophageal reflux disease) 03/23/2023   Altered mental status-resolved 03/23/2023  Nausea and vomiting 03/23/2023   Status post total right knee replacement 03/06/2023   Right knee injury, sequela 08/03/2022   Gait abnormality 04/27/2022   Osteoarthritis of right knee 04/27/2022   Injury of meniscus of left knee 09/08/2021   Left knee pain  08/11/2021   Aftercare following right knee joint replacement surgery 05/27/2020   Chronic pancreatitis (HCC) 12/01/2019   H/O total vaginal hysterectomy 05/31/2019   Spondylosis without myelopathy or radiculopathy, lumbosacral region 04/18/2017   Vitamin D deficiency 04/18/2017   Chronic pain syndrome 04/04/2017   Problems influencing health status 04/04/2017   Bacterial vaginosis, recurrent    Blind left eye 12/10/2014   Possiblle Anterior communicating artery aneurysm 10/30/2014   Hyperglycemia 10/17/2014   Morbid obesity (HCC) 09/24/2014   Meningitis, hx, 2016 09/21/2014   Reactive airway disease 11/21/2013   Depression 11/21/2013   Seasonal allergies 11/21/2013   Bipolar affective disorder, currently in remission (HCC) 02/04/2011   Hypertension 02/04/2011   PCP:  Nestor Ramp, MD Pharmacy:   [facility] [phone] - [place] - [address] AT [address]  [place] [address] Phone: [phone] Fax: [phone]  [facility] [address] [place] [address] Phone: [phone] Fax: [phone]     Social Drivers of Health (SDOH) Social History: SDOH Screenings   Food Insecurity: Food Insecurity Present (03/23/2023)  Housing: Low Risk  (03/23/2023)  Transportation Needs: Unmet Transportation Needs (03/23/2023)  Utilities: Not At Risk (03/23/2023)  Depression (PHQ2-9): High Risk (01/24/2023)  Social Connections: Socially Isolated (03/23/2023)  Tobacco Use: Low Risk  (03/23/2023)  Recent Concern: Tobacco Use - Medium Risk (02/12/2023)   SDOH Interventions:     Readmission Risk Interventions     No data to display

## 2023-03-24 ENCOUNTER — Inpatient Hospital Stay (HOSPITAL_COMMUNITY): Payer: MEDICAID | Admitting: Certified Registered"

## 2023-03-24 ENCOUNTER — Encounter (HOSPITAL_COMMUNITY)
Admission: EM | Disposition: A | Discharge status: Home / Self Care | Payer: Self-pay | Source: Home / Self Care | Attending: Internal Medicine

## 2023-03-24 ENCOUNTER — Encounter (HOSPITAL_COMMUNITY): Payer: Self-pay | Admitting: Internal Medicine

## 2023-03-24 DIAGNOSIS — K449 Diaphragmatic hernia without obstruction or gangrene: Secondary | ICD-10-CM

## 2023-03-24 DIAGNOSIS — R112 Nausea with vomiting, unspecified: Secondary | ICD-10-CM

## 2023-03-24 DIAGNOSIS — K2100 Gastro-esophageal reflux disease with esophagitis, without bleeding: Secondary | ICD-10-CM

## 2023-03-24 DIAGNOSIS — I10 Essential (primary) hypertension: Secondary | ICD-10-CM

## 2023-03-24 DIAGNOSIS — K2970 Gastritis, unspecified, without bleeding: Secondary | ICD-10-CM

## 2023-03-24 HISTORY — PX: BONE BIOPSY: SHX375

## 2023-03-24 HISTORY — PX: ESOPHAGOGASTRODUODENOSCOPY: SHX5428

## 2023-03-24 LAB — COMPREHENSIVE METABOLIC PANEL
ALT: 54 U/L — ABNORMAL HIGH (ref 0–44)
AST: 51 U/L — ABNORMAL HIGH (ref 15–41)
Albumin: 3.6 g/dL (ref 3.5–5.0)
Alkaline Phosphatase: 99 U/L (ref 38–126)
Anion gap: 10 (ref 5–15)
BUN: 8 mg/dL (ref 6–20)
CO2: 24 mmol/L (ref 22–32)
Calcium: 9.5 mg/dL (ref 8.9–10.3)
Chloride: 101 mmol/L (ref 98–111)
Creatinine, Ser: 0.76 mg/dL (ref 0.44–1.00)
GFR, Estimated: >60 mL/min (ref >60)
Glucose, Bld: 128 mg/dL — ABNORMAL HIGH (ref 70–99)
Potassium: 3.5 mmol/L (ref 3.5–5.1)
Sodium: 135 mmol/L (ref 135–145)
Total Bilirubin: 0.7 mg/dL (ref 0.0–1.2)
Total Protein: 7.8 g/dL (ref 6.5–8.1)

## 2023-03-24 SURGERY — EGD (ESOPHAGOGASTRODUODENOSCOPY)
Anesthesia: Monitor Anesthesia Care

## 2023-03-24 MED ORDER — PROPOFOL 10 MG/ML IV BOLUS
INTRAVENOUS | Status: AC
  Filled 2023-03-24: qty 20

## 2023-03-24 MED ORDER — LACTATED RINGERS IV SOLN: INTRAVENOUS | Status: DC | PRN

## 2023-03-24 MED ORDER — ONDANSETRON HCL 4 MG/2ML IJ SOLN
INTRAMUSCULAR | Status: DC | PRN
  Administered 2023-03-24: 4 mg via INTRAVENOUS

## 2023-03-24 MED ORDER — PROPOFOL 500 MG/50ML IV EMUL
INTRAVENOUS | Status: AC
  Filled 2023-03-24: qty 50

## 2023-03-24 MED ORDER — PROPOFOL 500 MG/50ML IV EMUL
INTRAVENOUS | Status: DC | PRN
  Administered 2023-03-24: 40 mg via INTRAVENOUS

## 2023-03-24 MED ORDER — PIPERACILLIN-TAZOBACTAM 3.375 G IVPB
3.3750 g | Freq: Three times a day (TID) | INTRAVENOUS | Status: DC
  Administered 2023-03-24 – 2023-03-27 (×9): 3.375 g via INTRAVENOUS
  Filled 2023-03-24 (×9): qty 50

## 2023-03-24 MED ORDER — POTASSIUM CHLORIDE 10 MEQ/100ML IV SOLN
10.0000 meq | INTRAVENOUS | Status: AC
  Administered 2023-03-24 (×4): 10 meq via INTRAVENOUS
  Filled 2023-03-24 (×2): qty 100

## 2023-03-24 MED ORDER — PANTOPRAZOLE SODIUM 40 MG IV SOLR
40.0000 mg | Freq: Two times a day (BID) | INTRAVENOUS | Status: DC
  Administered 2023-03-24 – 2023-03-26 (×5): 40 mg via INTRAVENOUS
  Filled 2023-03-24 (×5): qty 10

## 2023-03-24 MED ORDER — SUCRALFATE 1 GM/10ML PO SUSP
1.0000 g | Freq: Four times a day (QID) | ORAL | Status: DC
  Administered 2023-03-24 – 2023-03-27 (×9): 1 g via ORAL
  Filled 2023-03-24 (×11): qty 10

## 2023-03-24 NOTE — Op Note (Addendum)
 [facility] Patient Name: Holly Mendez Procedure Date: 03/24/2023 MRN: [id number] Attending MD: Lynann Bologna , MD, [id number] Date of Birth: [birth year] CSN: [id number] Age: 48 Admit Type: Inpatient Procedure:                Upper GI endoscopy Indications:              N/V, Generalized abdominal pain Providers:                Lynann Bologna, MD, Eliberto Ivory, RN, Rozetta Nunnery,                            Technician Referring MD:             Dr Elnoria Howard Medicines:                Monitored Anesthesia Care Complications:            No immediate complications. Estimated Blood Loss:     Estimated blood loss: none. Procedure:                Pre-Anesthesia Assessment:                           - Prior to the procedure, a History and Physical                            was performed, and patient medications and                            allergies were reviewed. The patient's tolerance of                            previous anesthesia was also reviewed. The risks                            and benefits of the procedure and the sedation                            options and risks were discussed with the patient.                            All questions were answered, and informed consent                            was obtained. Prior Anticoagulants: The patient has                            taken no anticoagulant or antiplatelet agents. ASA                            Grade Assessment: III - A patient with severe                            systemic disease. After reviewing the risks and  benefits, the patient was deemed in satisfactory                            condition to undergo the procedure.                           After obtaining informed consent, the endoscope was                            passed under direct vision. Throughout the                            procedure, the patient's blood pressure, pulse, and                            oxygen  saturations were monitored continuously. The                            [id number]-[id number] ([id number]) Olympus endoscope was introduced                            through the mouth, and advanced to the second part                            of duodenum. The upper GI endoscopy was                            accomplished without difficulty. The patient                            tolerated the procedure well. Scope In: Scope Out: Findings:      LA Grade D (one or more mucosal breaks involving at least 75% of       esophageal circumference) esophagitis with no bleeding was found 30 to       35 cm from the incisors. Biopsies were taken with a cold forceps for       histology.      A 2 cm hiatal hernia was present.      Localized mild inflammation characterized by congestion (edema),       erosions and nodularity was found in the gastric antrum and in the       prepyloric region of the stomach. Biopsies were taken with a cold       forceps for histology.      The examined duodenum was normal. Impression:               - LA Grade D reflux esophagitis with no bleeding.                            Biopsied (also to r/o any associated candidiasis)                           - 2 cm hiatal hernia.                           - Gastritis. Biopsied.                           -  Normal examined duodenum. Moderate Sedation:      Not Applicable - Patient had care per Anesthesia. Recommendation:           - Clear liquid diet.                           - IV Protonix 40 twice daily.                           - Use sucralfate suspension 1 gram PO QID for 2                            weeks.                           - Await pathology results.                           - Given that she had distended gallbladder on                            noncontrast CT Abdo/pelvis, cholelithiasis on                            ultrasound (with "+ Murphy's"), it is reasonable to                            proceed with HIDA scan to r/o  acute cholecystitis,                            IV zosyn and surgical consultation. Pt also                            requested to see surgery.                           - Stop all alcohol use and nonsteroidals.                           - GI will follow peripherally over the weekend. Dr.                            Marina Goodell is covering for Dr. Elnoria Howard tomorrow. Dr. Elnoria Howard                            will be back on Monday.                           - The findings and recommendations were discussed                            with the patient's family.                           - The findings and recommendations were discussed  with the referring physician. Sent secure chat                            message to Dr. Butler Denmark Procedure Code(s):        --- Professional ---                           [phone], Esophagogastroduodenoscopy, flexible,                            transoral; with biopsy, single or multiple Diagnosis Code(s):        --- Professional ---                           K21.00, Gastro-esophageal reflux disease with                            esophagitis, without bleeding                           K44.9, Diaphragmatic hernia without obstruction or                            gangrene                           K29.70, Gastritis, unspecified, without bleeding                           R11.15, Cyclical vomiting syndrome unrelated to                            migraine                           R10.84, Generalized abdominal pain CPT copyright 2022 American Medical Association. All rights reserved. The codes documented in this report are preliminary and upon coder review may  be revised to meet current compliance requirements. Lynann Bologna, MD 03/24/2023 9:37:08 AM This report has been signed electronically. Number of Addenda: 0

## 2023-03-24 NOTE — Plan of Care (Signed)
  Problem: Education: Goal: Knowledge of General Education information will improve Description: Including pain rating scale, medication(s)/side effects and non-pharmacologic comfort measures 03/24/2023 0506 by Elbert Ewings, RN Outcome: Progressing 03/24/2023 0506 by Elbert Ewings, RN Outcome: Progressing   Problem: Health Behavior/Discharge Planning: Goal: Ability to manage health-related needs will improve 03/24/2023 0506 by Elbert Ewings, RN Outcome: Progressing 03/24/2023 0506 by Elbert Ewings, RN Outcome: Progressing   Problem: Clinical Measurements: Goal: Ability to maintain clinical measurements within normal limits will improve 03/24/2023 0506 by Elbert Ewings, RN Outcome: Progressing 03/24/2023 0506 by Elbert Ewings, RN Outcome: Progressing Goal: Will remain free from infection 03/24/2023 0506 by Elbert Ewings, RN Outcome: Progressing 03/24/2023 0506 by Elbert Ewings, RN Outcome: Progressing Goal: Diagnostic test results will improve 03/24/2023 0506 by Elbert Ewings, RN Outcome: Progressing 03/24/2023 0506 by Elbert Ewings, RN Outcome: Progressing Goal: Respiratory complications will improve 03/24/2023 0506 by Elbert Ewings, RN Outcome: Progressing 03/24/2023 0506 by Elbert Ewings, RN Outcome: Progressing Goal: Cardiovascular complication will be avoided 03/24/2023 0506 by Elbert Ewings, RN Outcome: Progressing 03/24/2023 0506 by Elbert Ewings, RN Outcome: Progressing   Problem: Activity: Goal: Risk for activity intolerance will decrease 03/24/2023 0506 by Elbert Ewings, RN Outcome: Progressing 03/24/2023 0506 by Elbert Ewings, RN Outcome: Progressing   Problem: Nutrition: Goal: Adequate nutrition will be maintained 03/24/2023 0506 by Elbert Ewings, RN Outcome: Progressing 03/24/2023 0506 by Elbert Ewings, RN Outcome: Progressing   Problem: Elimination: Goal: Will not experience complications related to bowel  motility 03/24/2023 0506 by Elbert Ewings, RN Outcome: Progressing 03/24/2023 0506 by Elbert Ewings, RN Outcome: Progressing Goal: Will not experience complications related to urinary retention 03/24/2023 0506 by Elbert Ewings, RN Outcome: Progressing 03/24/2023 0506 by Elbert Ewings, RN Outcome: Progressing   Problem: Safety: Goal: Ability to remain free from injury will improve 03/24/2023 0506 by Elbert Ewings, RN Outcome: Progressing 03/24/2023 0506 by Elbert Ewings, RN Outcome: Progressing   Problem: Skin Integrity: Goal: Risk for impaired skin integrity will decrease 03/24/2023 0506 by Elbert Ewings, RN Outcome: Progressing 03/24/2023 0506 by Elbert Ewings, RN Outcome: Progressing

## 2023-03-24 NOTE — Anesthesia Postprocedure Evaluation (Signed)
 Anesthesia Post Note  Patient: Holly Mendez  Procedure(s) Performed: EGD (ESOPHAGOGASTRODUODENOSCOPY) BIOPSY, Gastric     Patient location during evaluation: PACU Anesthesia Type: MAC Level of consciousness: awake Pain management: pain level controlled Vital Signs Assessment: post-procedure vital signs reviewed and stable Respiratory status: spontaneous breathing, nonlabored ventilation and respiratory function stable Cardiovascular status: blood pressure returned to baseline and stable Postop Assessment: no apparent nausea or vomiting Anesthetic complications: no   No notable events documented.  Last Vitals:  Vitals:   03/24/23 0958 03/24/23 1350  BP:  (!) 153/89  Pulse: 89 87  Resp: 18 18  Temp:  36.8 C  SpO2: 97% 96%    Last Pain:  Vitals:   03/24/23 1631  TempSrc:   PainSc: 8                  Corabelle Spackman P Sharley Keeler

## 2023-03-24 NOTE — Progress Notes (Signed)
 [facility] Progress Note  Patient: Holly Mendez     NFA:[id number]  DOA: 03/22/2023   PCP: Nestor Ramp, MD       Brief hospital course: This is a 48 year old female with chronic pancreatitis, reactive airway disease, bipolar disorder, recent right knee replacement who presented to the hospital with lethargy.  She was given Narcan in the ED and became alert and oriented.  She admitted to nausea, vomiting with poor oral intake and a GI consult was requested.  Subjective:  Nauseated. Right knee pain.   Assessment and Plan: Principal Problem:   Intractable nausea and vomiting - Appreciate GI consult - She has undergone an EGD today which reveals: LA grade D reflux esophagitis with no bleeding, 2 cm hiatal hernia, gastritis - Recommended to stay on a twice daily PPI and Carafate - On clear liquid diet - HIDA scan ordered as imaging reveals gallstones    Active Problems:    Altered mental status -Felt to be secondary to polypharmacy - Improved with Narcan but still somnolent on exam today - Gabapentin, hydroxyzine, Seroquel, tizanidine remain on hold  Right knee pain - Underwent right knee replacement on 2/24 - healing well-  - Continue to follow  History of reactive airway disease - Continue Dulera and albuterol - no wheezing or cough  Bipolar disorder/schizoaffective disorder - Continue Depakote and Topamax  Hypertension - Continue losartan and clonidine and follow BP        Code Status: Full Code Total time on patient care: 35 minutes DVT prophylaxis: Heparin  Objective:   Vitals:   03/24/23 0435 03/24/23 0847 03/24/23 0942 03/24/23 0958  BP: (!) 154/98 (!) 175/91 120/85   Pulse: 89 85  89  Resp: 14 10  18   Temp: 99.1 F (37.3 C) 98.7 F (37.1 C) 97.6 F (36.4 C)   TempSrc: Oral Temporal    SpO2: 99% 99% 95% 97%  Weight:      Height:       Filed Weights   03/23/23 1000  Weight: 99.6 kg   Exam: General exam: Appears comfortable   HEENT: oral mucosa moist Respiratory system: Clear to auscultation.  Cardiovascular system: S1 & S2 heard  Gastrointestinal system: Abdomen - edema of right knee noted Psychiatry:  Mood & affect appropriate.      CBC: Recent Labs  Lab 03/19/23 1530 03/22/23 2237 03/23/23 0500  WBC 7.7 11.2* 10.8*  NEUTROABS 3.9 6.9  --   HGB 10.3* 11.7* 10.8*  HCT 31.9* 36.3 34.5*  MCV 88.6 86.6 88.5  PLT 616* 584* 552*   Basic Metabolic Panel: Recent Labs  Lab 03/19/23 1530 03/22/23 2237 03/23/23 0500 03/24/23 0732  NA 139 140 139 135  K 3.5 2.8* 3.2* 3.5  CL 107 99 100 101  CO2 21* 29 27 24   GLUCOSE 110* 167* 145* 128*  BUN 8 21* 18 8  CREATININE 0.67 0.92 0.77 0.76  CALCIUM 9.7 10.1 9.5 9.5  MG  --   --  2.2  --      Scheduled Meds:  cloNIDine  0.1 mg Transdermal Q Sun   divalproex  500 mg Oral TID   docusate sodium  100 mg Oral BID   feeding supplement  237 mL Oral BID BM   heparin  5,000 Units Subcutaneous Q8H   lidocaine  1 patch Transdermal Q24H   losartan  25 mg Oral Daily   mometasone-formoterol  2 puff Inhalation BID   pantoprazole (PROTONIX) IV  40 mg Intravenous  Q12H   sodium chloride flush  3 mL Intravenous Q12H   sucralfate  1 g Oral Q6H   topiramate  100 mg Oral QHS    Imaging and lab data personally reviewed   Author: Calvert Cantor  03/24/2023 12:14 PM  To contact [facility]>   Check the care team in [facility] and look for the attending/consulting [facility] provider listed  Log into [email or web address] and use [place]'s universal password   Go to> "[facility]"  and find provider  If you still have difficulty reaching the provider, please page the [facility] (Director on Call) for the Hospitalists listed on amion

## 2023-03-24 NOTE — Transfer of Care (Signed)
 Immediate Anesthesia Transfer of Care Note  Patient: Holly Mendez  Procedure(s) Performed: EGD (ESOPHAGOGASTRODUODENOSCOPY) BIOPSY, Gastric  Patient Location: PACU  Anesthesia Type:MAC  Level of Consciousness: awake, alert , oriented, and patient cooperative  Airway & Oxygen Therapy: Patient Spontanous Breathing  Post-op Assessment: Report given to RN and Post -op Vital signs reviewed and stable  Post vital signs: Reviewed and stable  Last Vitals:  Vitals Value Taken Time  BP 120/85 03/24/23 0945  Temp 36.4 C 03/24/23 0942  Pulse 95 03/24/23 0948  Resp 22 03/24/23 0948  SpO2 97 % 03/24/23 0948  Vitals shown include unfiled device data.  Last Pain:  Vitals:   03/24/23 0847  TempSrc: Temporal  PainSc: 10-Worst pain ever         Complications: No notable events documented.

## 2023-03-24 NOTE — Plan of Care (Signed)
  Problem: Education: Goal: Knowledge of General Education information will improve Description: Including pain rating scale, medication(s)/side effects and non-pharmacologic comfort measures Outcome: Progressing   Problem: Health Behavior/Discharge Planning: Goal: Ability to manage health-related needs will improve Outcome: Progressing   Problem: Clinical Measurements: Goal: Respiratory complications will improve Outcome: Progressing Goal: Cardiovascular complication will be avoided Outcome: Progressing   Problem: Elimination: Goal: Will not experience complications related to urinary retention Outcome: Progressing   Problem: Safety: Goal: Ability to remain free from injury will improve Outcome: Progressing

## 2023-03-24 NOTE — Interval H&P Note (Signed)
 History and Physical Interval Note:  03/24/2023 8:45 AM  Holly Mendez  has presented today for surgery, with the diagnosis of Nausea and vomiting.  The various methods of treatment have been discussed with the patient and family. After consideration of risks, benefits and other options for treatment, the patient has consented to  Procedure(s): EGD (ESOPHAGOGASTRODUODENOSCOPY) (N/A) as a surgical intervention.  The patient's history has been reviewed, patient examined, no change in status, stable for surgery.  I have reviewed the patient's chart and labs.  Questions were answered to the patient's satisfaction.     Lynann Bologna

## 2023-03-24 NOTE — Anesthesia Preprocedure Evaluation (Addendum)
 Anesthesia Evaluation  Patient identified by MRN, date of birth, ID band Patient awake    Reviewed: Allergy & Precautions, NPO status , Patient's Chart, lab work & pertinent test results  Airway Mallampati: III  TM Distance: >3 FB Neck ROM: Full    Dental no notable dental hx.    Pulmonary neg pulmonary ROS   Pulmonary exam normal        Cardiovascular hypertension, Pt. on medications Normal cardiovascular exam     Neuro/Psych  PSYCHIATRIC DISORDERS Anxiety Depression Bipolar Disorder Schizophrenia  Blind in left eye  Neuromuscular disease    GI/Hepatic ,GERD  Medicated and Controlled,,(+)     substance abuse    Endo/Other  negative endocrine ROS    Renal/GU negative Renal ROS     Musculoskeletal  (+) Arthritis ,  Fibromyalgia -, narcotic dependent  Abdominal  (+) + obese  Peds  Hematology  (+) Blood dyscrasia, anemia   Anesthesia Other Findings Nausea and vomiting  Reproductive/Obstetrics S/p tubal ligation                             Anesthesia Physical Anesthesia Plan  ASA: 3  Anesthesia Plan: MAC   Post-op Pain Management:    Induction:   PONV Risk Score and Plan: 2 and Ondansetron, Propofol infusion and Treatment may vary due to age or medical condition  Airway Management Planned: Nasal Cannula  Additional Equipment:   Intra-op Plan:   Post-operative Plan:   Informed Consent: I have reviewed the patients History and Physical, chart, labs and discussed the procedure including the risks, benefits and alternatives for the proposed anesthesia with the patient or authorized representative who has indicated his/her understanding and acceptance.     Dental advisory given  Plan Discussed with: CRNA and Surgeon  Anesthesia Plan Comments:        Anesthesia Quick Evaluation

## 2023-03-24 NOTE — Progress Notes (Signed)
 Pharmacy Antibiotic Note  Holly Mendez is a 48 y.o. female admitted on 03/22/2023 with AMS.  Pharmacy has been consulted for zosyn dosing.  Plan: Zosyn 3.375g IV q8h (4 hour infusion). Pharmacy to sign off  Height: 5\' 5"  (165.1 cm) Weight: 99.6 kg (219 lb 9.3 oz) IBW/kg (Calculated) : 57  Temp (24hrs), Avg:98.7 F (37.1 C), Min:97.6 F (36.4 C), Max:99.1 F (37.3 C)  Recent Labs  Lab 03/19/23 1530 03/22/23 2237 03/23/23 0500 03/24/23 0732  WBC 7.7 11.2* 10.8*  --   CREATININE 0.67 0.92 0.77 0.76    Estimated Creatinine Clearance: 99.4 mL/min (by C-G formula based on SCr of 0.76 mg/dL).    Allergies  Allergen Reactions   Lyrica [Pregabalin] Swelling    Swelling of hands and feet   Celebrex [Celecoxib] Rash   Gabapentin Swelling    Pt denies this   Naproxen Swelling   Norvasc [Amlodipine Besylate] Other (See Comments)    Mouth irritation    Zithromax [Azithromycin] Other (See Comments)    Severe stomach cramping   Nabumetone Swelling and Rash    Thank you for allowing pharmacy to be a part of this patient's care.  Herby Abraham, Pharm.D Use secure chat for questions 03/24/2023 11:31 AM

## 2023-03-25 DIAGNOSIS — R112 Nausea with vomiting, unspecified: Secondary | ICD-10-CM | POA: Diagnosis not present

## 2023-03-25 NOTE — Plan of Care (Signed)
  Problem: Education: Goal: Knowledge of General Education information will improve Description: Including pain rating scale, medication(s)/side effects and non-pharmacologic comfort measures Outcome: Progressing   Problem: Health Behavior/Discharge Planning: Goal: Ability to manage health-related needs will improve Outcome: Progressing   Problem: Clinical Measurements: Goal: Ability to maintain clinical measurements within normal limits will improve Outcome: Progressing Goal: Will remain free from infection Outcome: Progressing Goal: Diagnostic test results will improve Outcome: Progressing Goal: Respiratory complications will improve Outcome: Progressing   Problem: Activity: Goal: Risk for activity intolerance will decrease Outcome: Progressing   Problem: Nutrition: Goal: Adequate nutrition will be maintained Outcome: Progressing   Problem: Coping: Goal: Level of anxiety will decrease Outcome: Progressing   Problem: Elimination: Goal: Will not experience complications related to bowel motility Outcome: Progressing Goal: Will not experience complications related to urinary retention Outcome: Progressing   Problem: Safety: Goal: Ability to remain free from injury will improve Outcome: Progressing   Problem: Skin Integrity: Goal: Risk for impaired skin integrity will decrease Outcome: Progressing   

## 2023-03-25 NOTE — Progress Notes (Signed)
 Ortho Sleeve applied to Right leg, by ortho tech.

## 2023-03-25 NOTE — Progress Notes (Signed)
 Orthopedic Tech Progress Note Patient Details:  Holly Mendez 29-Aug-1975 [id number]  Ortho Devices Type of Ortho Device: Knee Sleeve Ortho Device/Splint Location: RLE Ortho Device/Splint Interventions: Ordered, Application   Post Interventions Patient Tolerated: Well Instructions Provided: Care of device  Tonye Pearson 03/25/2023, 12:41 PM

## 2023-03-25 NOTE — Plan of Care (Signed)

## 2023-03-25 NOTE — Progress Notes (Signed)
 [facility] Progress Note  Patient: Holly Mendez     ZOX:[id number]  DOA: 03/22/2023   PCP: Nestor Ramp, MD       Brief hospital course: This is a 48 year old female with chronic pancreatitis, reactive airway disease, bipolar disorder, recent right knee replacement who presented to the hospital with lethargy.  She was given Narcan in the ED and became alert and oriented.  She admitted to nausea, vomiting with poor oral intake and a GI consult was requested.  Subjective:  Still nauseated today. No vomiting. Abdominal pain improved.   Assessment and Plan: Principal Problem:   Intractable nausea and vomiting - Appreciate GI consult - She has undergone an EGD today which reveals: LA grade D reflux esophagitis with no bleeding, 2 cm hiatal hernia, gastritis - Recommended to stay on a twice daily PPI and Carafate - On clear liquid diet - HIDA scan ordered as imaging reveals gallstones    Active Problems:    Altered mental status -Felt to be secondary to polypharmacy - Improved with Narcan   - Gabapentin, hydroxyzine, Seroquel, tizanidine remain on hold  Right knee pain - Underwent right knee replacement on 2/24 - healing well-  - Continue to follow  History of reactive airway disease - Continue Dulera and albuterol - no wheezing or cough  Bipolar disorder/schizoaffective disorder - Continue Depakote and Topamax  Hypertension - Continue losartan and clonidine and follow BP        Code Status: Full Code Total time on patient care: 35 minutes DVT prophylaxis: Heparin  Objective:   Vitals:   03/24/23 1350 03/24/23 2201 03/25/23 0513 03/25/23 0741  BP: (!) 153/89 (!) 159/97 (!) 159/95   Pulse: 87 95 92   Resp: 18 16 16    Temp: 98.3 F (36.8 C) 98.7 F (37.1 C) 98.9 F (37.2 C)   TempSrc: Oral Oral Oral   SpO2: 96% 96% 100% 98%  Weight:      Height:       Filed Weights   03/23/23 1000  Weight: 99.6 kg   Exam: General exam: Appears  comfortable  HEENT: oral mucosa moist Respiratory system: Clear to auscultation.  Cardiovascular system: S1 & S2 heard  Gastrointestinal system: Abdomen - edema of right knee noted Psychiatry:  Mood & affect appropriate.      CBC: Recent Labs  Lab 03/19/23 1530 03/22/23 2237 03/23/23 0500  WBC 7.7 11.2* 10.8*  NEUTROABS 3.9 6.9  --   HGB 10.3* 11.7* 10.8*  HCT 31.9* 36.3 34.5*  MCV 88.6 86.6 88.5  PLT 616* 584* 552*   Basic Metabolic Panel: Recent Labs  Lab 03/19/23 1530 03/22/23 2237 03/23/23 0500 03/24/23 0732  NA 139 140 139 135  K 3.5 2.8* 3.2* 3.5  CL 107 99 100 101  CO2 21* 29 27 24   GLUCOSE 110* 167* 145* 128*  BUN 8 21* 18 8  CREATININE 0.67 0.92 0.77 0.76  CALCIUM 9.7 10.1 9.5 9.5  MG  --   --  2.2  --      Scheduled Meds:  cloNIDine  0.1 mg Transdermal Q Sun   divalproex  500 mg Oral TID   docusate sodium  100 mg Oral BID   feeding supplement  237 mL Oral BID BM   heparin  5,000 Units Subcutaneous Q8H   lidocaine  1 patch Transdermal Q24H   losartan  25 mg Oral Daily   mometasone-formoterol  2 puff Inhalation BID   pantoprazole (PROTONIX) IV  40 mg  Intravenous Q12H   sodium chloride flush  3 mL Intravenous Q12H   sucralfate  1 g Oral Q6H   topiramate  100 mg Oral QHS    Imaging and lab data personally reviewed   Author: Calvert Cantor  03/25/2023 10:06 AM  To contact [facility]>   Check the care team in [facility] and look for the attending/consulting [facility] provider listed  Log into [email or web address] and use [place]'s universal password   Go to> "[facility]"  and find provider  If you still have difficulty reaching the provider, please page the [facility] (Director on Call) for the Hospitalists listed on amion

## 2023-03-26 ENCOUNTER — Telehealth: Payer: Self-pay | Admitting: Orthopaedic Surgery

## 2023-03-26 ENCOUNTER — Inpatient Hospital Stay (HOSPITAL_COMMUNITY): Payer: MEDICAID

## 2023-03-26 ENCOUNTER — Telehealth: Payer: Self-pay | Admitting: *Deleted

## 2023-03-26 DIAGNOSIS — R112 Nausea with vomiting, unspecified: Secondary | ICD-10-CM | POA: Diagnosis not present

## 2023-03-26 MED ORDER — PANTOPRAZOLE SODIUM 40 MG PO TBEC
40.0000 mg | DELAYED_RELEASE_TABLET | Freq: Two times a day (BID) | ORAL | Status: DC
  Administered 2023-03-26 – 2023-03-27 (×2): 40 mg via ORAL
  Filled 2023-03-26 (×2): qty 1

## 2023-03-26 MED ORDER — TECHNETIUM TC 99M MEBROFENIN IV KIT
5.3300 | PACK | Freq: Once | INTRAVENOUS | Status: AC
  Administered 2023-03-26: 5.33 via INTRAVENOUS

## 2023-03-26 MED ORDER — MORPHINE SULFATE (PF) 2 MG/ML IV SOLN
2.0000 mg | INTRAVENOUS | Status: DC | PRN
  Administered 2023-03-26 – 2023-03-27 (×5): 2 mg via INTRAVENOUS
  Filled 2023-03-26 (×5): qty 1

## 2023-03-26 NOTE — Telephone Encounter (Signed)
 Pt wants to let Dr Magnus Ivan know she has been in the hospital since her last visit

## 2023-03-26 NOTE — Progress Notes (Signed)
 Orthopedic Tech Progress Note Patient Details:  Holly Mendez 07/27/1975 [id number]  Ortho Devices Type of Ortho Device: Knee Sleeve Ortho Device/Splint Location: RLE Ortho Device/Splint Interventions: Ordered   Post Interventions Patient Tolerated: Well Instructions Provided: Care of device New knee sleeve provided due to patient stating her previous one is too small. [place] A Keyler Hoge 03/26/2023, 10:10 AM

## 2023-03-26 NOTE — Telephone Encounter (Signed)
 Per Report from Dr. Jennette Kettle, patient was counseled on actions from 03/19/23 phone note.  She was made aware that if those actions were to happen again she could be dismissed. Jone Baseman, CMA

## 2023-03-26 NOTE — Progress Notes (Signed)
 [facility] Progress Note  Patient: Holly Mendez     BJY:[id number]  DOA: 03/22/2023   PCP: Nestor Ramp, MD       Brief hospital course: This is a 48 year old female with chronic pancreatitis, reactive airway disease, bipolar disorder, recent right knee replacement who presented to the hospital with lethargy.  She was given Narcan in the ED and became alert and oriented.  She admitted to nausea, vomiting with poor oral intake and a GI consult was requested.  Subjective:  Nausea resolved. Has been having frequent bowel movements.   Assessment and Plan: Principal Problem:   Intractable nausea and vomiting - Appreciate GI consult - She has undergone an EGD today which reveals: LA grade D reflux esophagitis with no bleeding, 2 cm hiatal hernia, gastritis - Recommended to stay on a twice daily PPI and Carafate - HIDA scan ordered as imaging reveals gallstones    Active Problems:    Altered mental status -Felt to be secondary to polypharmacy - Improved with Narcan   - Gabapentin, hydroxyzine, Seroquel, tizanidine remain on hold  Right knee pain - Underwent right knee replacement on 2/24 - healing well- compression sleeve ordered - Continue to follow  History of reactive airway disease - Continue Dulera and albuterol - no wheezing or cough  Bipolar disorder/schizoaffective disorder - Continue Depakote and Topamax  Hypertension - Continue losartan and clonidine and follow BP        Code Status: Full Code Total time on patient care: 35 minutes DVT prophylaxis: Heparin  Objective:   Vitals:   03/25/23 0741 03/25/23 1335 03/25/23 2156 03/26/23 0456  BP:  (!) 145/98 111/69 125/76  Pulse:  (!) 108 (!) 106 (!) 102  Resp:  20 16 16   Temp:  (!) 97.5 F (36.4 C) 99 F (37.2 C) 99 F (37.2 C)  TempSrc:  Oral Oral Oral  SpO2: 98% 99% 97% 99%  Weight:      Height:       Filed Weights   03/23/23 1000  Weight: 99.6 kg   Exam: General exam: Appears  comfortable  HEENT: oral mucosa moist Respiratory system: Clear to auscultation.  Cardiovascular system: S1 & S2 heard  Gastrointestinal system: Abdomen - edema of right knee noted Psychiatry:  Mood & affect appropriate.      CBC: Recent Labs  Lab 03/19/23 1530 03/22/23 2237 03/23/23 0500  WBC 7.7 11.2* 10.8*  NEUTROABS 3.9 6.9  --   HGB 10.3* 11.7* 10.8*  HCT 31.9* 36.3 34.5*  MCV 88.6 86.6 88.5  PLT 616* 584* 552*   Basic Metabolic Panel: Recent Labs  Lab 03/19/23 1530 03/22/23 2237 03/23/23 0500 03/24/23 0732  NA 139 140 139 135  K 3.5 2.8* 3.2* 3.5  CL 107 99 100 101  CO2 21* 29 27 24   GLUCOSE 110* 167* 145* 128*  BUN 8 21* 18 8  CREATININE 0.67 0.92 0.77 0.76  CALCIUM 9.7 10.1 9.5 9.5  MG  --   --  2.2  --      Scheduled Meds:  cloNIDine  0.1 mg Transdermal Q Sun   divalproex  500 mg Oral TID   feeding supplement  237 mL Oral BID BM   heparin  5,000 Units Subcutaneous Q8H   lidocaine  1 patch Transdermal Q24H   losartan  25 mg Oral Daily   mometasone-formoterol  2 puff Inhalation BID   pantoprazole (PROTONIX) IV  40 mg Intravenous Q12H   sodium chloride flush  3 mL  Intravenous Q12H   sucralfate  1 g Oral Q6H   topiramate  100 mg Oral QHS    Imaging and lab data personally reviewed   Author: Calvert Cantor  03/26/2023 9:31 AM  To contact [facility]>   Check the care team in [facility] and look for the attending/consulting [facility] provider listed  Log into [email or web address] and use [place]'s universal password   Go to> "[facility]"  and find provider  If you still have difficulty reaching the provider, please page the [facility] (Director on Call) for the Hospitalists listed on amion

## 2023-03-27 ENCOUNTER — Encounter (HOSPITAL_COMMUNITY): Payer: Self-pay | Admitting: Gastroenterology

## 2023-03-27 DIAGNOSIS — R112 Nausea with vomiting, unspecified: Secondary | ICD-10-CM | POA: Diagnosis not present

## 2023-03-27 MED ORDER — ESOMEPRAZOLE MAGNESIUM 40 MG PO CPDR: 40.0000 mg | DELAYED_RELEASE_CAPSULE | Freq: Two times a day (BID) | ORAL | 1 refills | Status: DC

## 2023-03-27 MED ORDER — SUCRALFATE 1 GM/10ML PO SUSP: 1.0000 g | Freq: Four times a day (QID) | ORAL | 0 refills | Status: DC

## 2023-03-27 NOTE — Plan of Care (Signed)
  Problem: Education: Goal: Knowledge of General Education information will improve Description: Including pain rating scale, medication(s)/side effects and non-pharmacologic comfort measures Outcome: Progressing   Problem: Health Behavior/Discharge Planning: Goal: Ability to manage health-related needs will improve Outcome: Progressing   Problem: Clinical Measurements: Goal: Ability to maintain clinical measurements within normal limits will improve Outcome: Progressing Goal: Will remain free from infection Outcome: Progressing Goal: Diagnostic test results will improve Outcome: Progressing Goal: Respiratory complications will improve Outcome: Progressing   Problem: Activity: Goal: Risk for activity intolerance will decrease Outcome: Progressing   Problem: Nutrition: Goal: Adequate nutrition will be maintained Outcome: Progressing   Problem: Coping: Goal: Level of anxiety will decrease Outcome: Progressing   Problem: Elimination: Goal: Will not experience complications related to bowel motility Outcome: Progressing Goal: Will not experience complications related to urinary retention Outcome: Progressing   Problem: Pain Managment: Goal: General experience of comfort will improve and/or be controlled Outcome: Progressing   Problem: Safety: Goal: Ability to remain free from injury will improve Outcome: Progressing   Problem: Skin Integrity: Goal: Risk for impaired skin integrity will decrease Outcome: Progressing

## 2023-03-27 NOTE — Discharge Summary (Signed)
 Physician Discharge Summary  Holly Mendez [id number]:[id number] DOB: 31-Jul-1975 DOA: 03/22/2023  PCP: Nestor Ramp, MD  Admit date: 03/22/2023 Discharge date: 03/27/2023 Discharging to: home    Consults:  GI Procedures:  EGD   Discharge Diagnoses:   Principal Problem:   Intractable nausea and vomiting Active Problems: Toxic encephalopathy due to polypharmacy    Chronic pain syndrome   Aftercare following right knee joint replacement surgery   Hypokalemia   Reactive airway disease   Depression   Chronic pancreatitis (HCC)   Schizoaffective disorder (HCC)   Bipolar disorder (HCC)   GERD (gastroesophageal reflux disease)       Brief hospital course: This is a 48 year old female with chronic pancreatitis, reactive airway disease, bipolar disorder, recent right knee replacement who presented to the hospital with lethargy.  She was given Narcan in the ED and became alert and oriented.  She admitted to nausea, vomiting with poor oral intake and a GI consult was requested.     Assessment and Plan: Principal Problem:   Intractable nausea and vomiting - Appreciate GI consult - She has undergone an EGD today which reveals: LA grade D reflux esophagitis with no bleeding, 2 cm hiatal hernia, gastritis - Recommended to stay on a twice daily PPI and Carafate - HIDA scan ordered as imaging reveals gallstones and patient felt that they were the cause of her symptoms, however could not be completed due to severe knee pain - she has been tolerating solids > 24 hrs and I do not feel she needs further work up for cholecystitis   Active Problems:   Acute toxic encephalopathy -Felt to be secondary to polypharmacy - Improved with Narcan   - Gabapentin, hydroxyzine, Seroquel, tizanidine were on hold   Right knee pain - Underwent right knee replacement on 2/24 - healing well- compression sleeve ordered  History of reactive airway disease - Continue Dulera and albuterol - no wheezing or  cough   Bipolar disorder/schizoaffective disorder - Continue Depakote and Topamax   Hypertension - Continue losartan and clonidine and follow BP          Discharge Instructions  Discharge Instructions     Diet - low sodium heart healthy   Complete by: As directed    Increase activity slowly   Complete by: As directed    No wound care   Complete by: As directed       Allergies as of 03/27/2023       Reactions   Lyrica [pregabalin] Swelling   Swelling of hands and feet   Celebrex [celecoxib] Rash   Gabapentin Swelling   Pt denies this   Naproxen Swelling   Norvasc [amlodipine Besylate] Other (See Comments)   Mouth irritation    Zithromax [azithromycin] Other (See Comments)   Severe stomach cramping   Nabumetone Swelling, Rash        Medication List     STOP taking these medications    hydrOXYzine 25 MG tablet Commonly known as: ATARAX       TAKE these medications    aspirin 81 MG chewable tablet Chew 1 tablet (81 mg total) by mouth 2 (two) times daily.   cetirizine 10 MG tablet Commonly known as: ZYRTEC Take 10 mg by mouth in the morning.   cloNIDine 0.1 mg/24hr patch Commonly known as: CATAPRES - Dosed in mg/24 hr PLACE 1 PATCH ONTO SKIN ONCE WEEKLY. What changed:  how much to take how to take this when to take this   divalproex  500 MG DR tablet Commonly known as: DEPAKOTE Take 500 mg by mouth 2 (two) times daily.   esomeprazole 40 MG capsule Commonly known as: NEXIUM Take 1 capsule (40 mg total) by mouth 2 (two) times daily before a meal. What changed: when to take this   gabapentin 300 MG capsule Commonly known as: NEURONTIN Take 600 mg by mouth at bedtime.   HYDROcodone-acetaminophen 5-325 MG tablet Commonly known as: NORCO/VICODIN TAKE TWO TABLETS BY MOUTH EVERY MORNING, 1 TAB MIDDAY AND 1 TAB AT NIGHT   Narcan 4 MG/0.1ML Liqd nasal spray kit Generic drug: naloxone Place 1 spray into the nose daily as needed (overdose).    polyethylene glycol powder 17 GM/SCOOP powder Commonly known as: GaviLAX Take one or two capfuls daily by mouth as directed for constipation What changed:  how much to take how to take this when to take this reasons to take this additional instructions   QUEtiapine 400 MG tablet Commonly known as: SEROQUEL Take 400 mg by mouth at bedtime.   sucralfate 1 GM/10ML suspension Commonly known as: CARAFATE Take 10 mLs (1 g total) by mouth every 6 (six) hours.   Symbicort 80-4.5 MCG/ACT inhaler Generic drug: budesonide-formoterol INHALE 2 PUFFS INTO THE LUNGS TWICE DAILY AS NEEDED.   tiZANidine 4 MG tablet Commonly known as: ZANAFLEX Take 1 tablet (4 mg total) by mouth every 6 (six) hours as needed for muscle spasms.   topiramate 100 MG tablet Commonly known as: TOPAMAX Take 1 tablet (100 mg total) by mouth 2 (two) times daily. By psychiatry What changed: when to take this   Ventolin HFA 108 (90 Base) MCG/ACT inhaler Generic drug: albuterol INHALE TWO PUFFS INTO THE LUNGS EVERY 6 HOURS AS NEEDED WHEEZING OR SHORTNESS OF BREATH            The results of significant diagnostics from this hospitalization (including imaging, microbiology, ancillary and laboratory) are listed below for reference.    NM Hepato W/EF Result Date: 03/26/2023 CLINICAL DATA:  Abdominal pain, acute, nonlocalized. EXAM: NUCLEAR MEDICINE HEPATOBILIARY IMAGING TECHNIQUE: Sequential images of the abdomen were obtained out to 60 minutes following intravenous administration of radiopharmaceutical. RADIOPHARMACEUTICALS:  5.33 mCi Tc-19m  Choletec IV COMPARISON:  Ultrasound March 23, 2023 FINDINGS: Prompt uptake and biliary excretion of activity by the liver is seen. Gallbladder activity is visualized, consistent with patency of cystic duct. Biliary activity passes into small bowel, consistent with patent common bile duct. IMPRESSION: 1.  Patent cystic and common bile ducts. 2. Patient requested to terminate  imaging prior to calculation of ejection fraction due to severe knee pain. Electronically Signed   By: Maudry Mayhew M.D.   On: 03/26/2023 15:47   US Abdomen Limited RUQ (LIVER/GB) Result Date: 03/24/2023 CLINICAL DATA:  Nausea vomiting EXAM: ULTRASOUND ABDOMEN LIMITED RIGHT UPPER QUADRANT COMPARISON:  CT 03/22/2023 FINDINGS: Gallbladder: Multiple gallstones. Normal wall thickness. Positive sonographic Murphy. No pericholecystic fluid Common bile duct: Diameter: 4 mm Liver: No focal lesion identified. Within normal limits in parenchymal echogenicity. Portal vein is patent on color Doppler imaging with normal direction of blood flow towards the liver. Other: None. IMPRESSION: Cholelithiasis with positive sonographic Eulah Pont raising concern for acute cholecystitis. No gallbladder wall thickening or pericholecystic fluid is seen. Negative for biliary dilatation. These results will be called to the ordering clinician or representative by the Radiologist Assistant, and communication documented in the PACS or [employer]. Electronically Signed   By: Jasmine Pang M.D.   On: 03/24/2023 00:16   CT ABDOMEN  PELVIS WO CONTRAST Result Date: 03/23/2023 CLINICAL DATA:  Vomiting for several days. EXAM: CT ABDOMEN AND PELVIS WITHOUT CONTRAST TECHNIQUE: Multidetector CT imaging of the abdomen and pelvis was performed following the standard protocol without IV contrast. RADIATION DOSE REDUCTION: This exam was performed according to the departmental dose-optimization program which includes automated exposure control, adjustment of the mA and/or kV according to patient size and/or use of iterative reconstruction technique. COMPARISON:  January 04, 2023 FINDINGS: Lower chest: No acute abnormality. Hepatobiliary: No focal liver abnormality is seen. The gallbladder is moderately distended without evidence of gallstones, gallbladder wall thickening, or biliary dilatation. Pancreas: Unremarkable. No pancreatic ductal dilatation  or surrounding inflammatory changes. Spleen: Normal in size without focal abnormality. Adrenals/Urinary Tract: Adrenal glands are unremarkable. Kidneys are normal, without renal calculi, focal lesion, or hydronephrosis. The urinary bladder is poorly distended and subsequently limited in evaluation. Stomach/Bowel: There is a small, stable hiatal hernia. Appendix appears normal. No evidence of bowel wall thickening, distention, or inflammatory changes. Vascular/Lymphatic: No significant vascular findings are present. No enlarged abdominal or pelvic lymph nodes. Reproductive: Status post hysterectomy. No adnexal masses. Other: No abdominal wall hernia or abnormality. No abdominopelvic ascites. Musculoskeletal: No acute or significant osseous findings. IMPRESSION: 1. Small, stable hiatal hernia. 2. Evidence of prior hysterectomy. Electronically Signed   By: Aram Candela M.D.   On: 03/23/2023 00:38   DG Knee Complete 4 Views Right Result Date: 03/19/2023 CLINICAL DATA:  Right knee pain, recent right knee replacement EXAM: RIGHT KNEE - COMPLETE 4+ VIEW COMPARISON:  03/06/2023 FINDINGS: Frontal, bilateral oblique, and lateral views of the right knee are obtained. Three component right knee arthroplasty is identified in the expected position without evidence of acute complication. No acute fracture, subluxation, or dislocation. Moderate right knee effusion. Diffuse subcutaneous edema. Surgical skin staples within the anterior right knee. IMPRESSION: 1. Unremarkable right knee arthroplasty. 2. Moderate right knee effusion. 3. Diffuse subcutaneous edema. Electronically Signed   By: Sharlet Salina M.D.   On: 03/19/2023 18:13   US Venous Img Lower Unilateral Right Result Date: 03/19/2023 CLINICAL DATA:  History of recent right knee surgery with right lower extremity edema and pain. EXAM: RIGHT LOWER EXTREMITY VENOUS DOPPLER ULTRASOUND TECHNIQUE: Gray-scale sonography with compression, as well as color and duplex  ultrasound, were performed to evaluate the deep venous system(s) from the level of the common femoral vein through the popliteal and proximal calf veins. COMPARISON:  None Available. FINDINGS: VENOUS Normal compressibility of the common femoral, superficial femoral, and popliteal veins, as well as the visualized calf veins. Visualized portions of profunda femoral vein and great saphenous vein unremarkable. No filling defects to suggest DVT on grayscale or color Doppler imaging. Doppler waveforms show normal direction of venous flow, normal respiratory plasticity and response to augmentation. Limited views of the contralateral common femoral vein are unremarkable. OTHER None. Limitations: none IMPRESSION: Negative. Electronically Signed   By: Malachy Moan M.D.   On: 03/19/2023 16:18   DG Knee Right Port Result Date: 03/06/2023 CLINICAL DATA:  Status post right knee arthroplasty EXAM: PORTABLE RIGHT KNEE - 1-2 VIEW COMPARISON:  Right knee radiographs 11/09/2022 FINDINGS: Interval total right knee arthroplasty. No perihardware lucency is seen to indicate hardware failure or loosening. Expected postoperative changes including intra-articular and subcutaneous air. Minimal chronic enthesopathic change at the quadriceps insertion on the patella. Smalljoint effusion. Anterior surgical skin staples. No acute fracture or dislocation. IMPRESSION: Interval total right knee arthroplasty without evidence of hardware failure. Electronically Signed   By: Windy Fast  Viola M.D.   On: 03/06/2023 16:50   Labs:   Basic Metabolic Panel: Recent Labs  Lab 03/22/23 2237 03/23/23 0500 03/24/23 0732  NA 140 139 135  K 2.8* 3.2* 3.5  CL 99 100 101  CO2 29 27 24   GLUCOSE 167* 145* 128*  BUN 21* 18 8  CREATININE 0.92 0.77 0.76  CALCIUM 10.1 9.5 9.5  MG  --  2.2  --      CBC: Recent Labs  Lab 03/22/23 2237 03/23/23 0500  WBC 11.2* 10.8*  NEUTROABS 6.9  --   HGB 11.7* 10.8*  HCT 36.3 34.5*  MCV 86.6 88.5  PLT  584* 552*         SIGNED:   Calvert Cantor, MD  [facility] 03/27/2023, 10:01 AM Time taking on discharge: 50 minutes

## 2023-03-28 LAB — SURGICAL PATHOLOGY

## 2023-04-03 ENCOUNTER — Telehealth: Payer: Self-pay | Admitting: Gastroenterology

## 2023-04-03 NOTE — Telephone Encounter (Signed)
 Inbound call from patient stating that she needed to make a follow up appointment with our practice after being released from the hospital after EGD procedure on 3/15 with Dr. Chales Abrahams while at [facility]. Patient stated that she called Dr. Rolla Etienne office and they advised she needed to call us to make the appointment. I advised patient the next availability was 4/28. Patient stated that she could not wait that long because she has gallstones in her stomach and its causing her pain. Patient was scheduled to see Deanna May on 4/28 at 9:00 and patient is requesting a call back from nurse to discuss if she can be seen sooner. Please advise.

## 2023-04-03 NOTE — Telephone Encounter (Signed)
 Pt was contacted in regard to message below.  Pt was offered an office visit for tomorrow with Quentin Mulling PA at 9:20 AM. Pt stated " what am I supposed to do with that. I live in [place] and I don't drive"  Pt hung up

## 2023-04-11 ENCOUNTER — Other Ambulatory Visit: Payer: Self-pay | Admitting: Family Medicine

## 2023-04-11 ENCOUNTER — Other Ambulatory Visit: Payer: Self-pay

## 2023-04-11 ENCOUNTER — Ambulatory Visit (HOSPITAL_COMMUNITY): Payer: MEDICAID | Attending: Orthopaedic Surgery

## 2023-04-11 DIAGNOSIS — R262 Difficulty in walking, not elsewhere classified: Secondary | ICD-10-CM | POA: Diagnosis present

## 2023-04-11 DIAGNOSIS — M25661 Stiffness of right knee, not elsewhere classified: Secondary | ICD-10-CM | POA: Diagnosis present

## 2023-04-11 DIAGNOSIS — M25561 Pain in right knee: Secondary | ICD-10-CM | POA: Insufficient documentation

## 2023-04-11 DIAGNOSIS — M1711 Unilateral primary osteoarthritis, right knee: Secondary | ICD-10-CM | POA: Diagnosis not present
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
## 2023-04-13 ENCOUNTER — Encounter (HOSPITAL_COMMUNITY): Payer: MEDICAID

## 2023-04-16 ENCOUNTER — Telehealth (HOSPITAL_COMMUNITY): Payer: Self-pay | Admitting: Physical Therapy

## 2023-04-16 ENCOUNTER — Other Ambulatory Visit: Payer: Self-pay

## 2023-04-16 ENCOUNTER — Ambulatory Visit (HOSPITAL_COMMUNITY): Payer: MEDICAID | Admitting: Physical Therapy

## 2023-04-16 MED ORDER — HYDROCODONE-ACETAMINOPHEN 5-325 MG PO TABS: ORAL_TABLET | ORAL | 0 refills | Status: DC

## 2023-04-16 NOTE — Telephone Encounter (Signed)
 Pt did not show for appt.  Called and spoke to pt who states she is at the hospital with her husband.  Reports she is going to see about getting her PT transferred to [facility] and will let us know if this happens.  Pt is aware of next scheduled appt.  Lurena Nida, PTA/CLT [facility] Ph: [phone]

## 2023-04-17 ENCOUNTER — Encounter: Payer: Self-pay | Admitting: Gastroenterology

## 2023-04-18 ENCOUNTER — Telehealth (HOSPITAL_COMMUNITY): Payer: Self-pay | Admitting: Physical Therapy

## 2023-04-18 ENCOUNTER — Encounter: Payer: MEDICAID | Admitting: Physician Assistant

## 2023-04-18 ENCOUNTER — Encounter (HOSPITAL_COMMUNITY): Payer: MEDICAID | Admitting: Physical Therapy

## 2023-04-18 NOTE — Telephone Encounter (Signed)
 Second NS today; cancelled all remaining appts with exception of next one.    Lurena Nida, PTA/CLT [facility] Ph: [phone]

## 2023-04-19 ENCOUNTER — Emergency Department (HOSPITAL_COMMUNITY): Payer: MEDICAID

## 2023-04-19 ENCOUNTER — Other Ambulatory Visit: Payer: Self-pay

## 2023-04-19 ENCOUNTER — Observation Stay (HOSPITAL_COMMUNITY)
Admission: EM | Admit: 2023-04-19 | Discharge: 2023-04-21 | Disposition: A | Discharge status: Home / Self Care | Payer: MEDICAID

## 2023-04-19 DIAGNOSIS — I10 Essential (primary) hypertension: Secondary | ICD-10-CM | POA: Insufficient documentation

## 2023-04-19 DIAGNOSIS — Z96651 Presence of right artificial knee joint: Secondary | ICD-10-CM | POA: Insufficient documentation

## 2023-04-19 DIAGNOSIS — Z79899 Other long term (current) drug therapy: Secondary | ICD-10-CM | POA: Diagnosis not present

## 2023-04-19 DIAGNOSIS — K819 Cholecystitis, unspecified: Principal | ICD-10-CM | POA: Diagnosis present

## 2023-04-19 DIAGNOSIS — Z7982 Long term (current) use of aspirin: Secondary | ICD-10-CM | POA: Diagnosis not present

## 2023-04-19 DIAGNOSIS — K8012 Calculus of gallbladder with acute and chronic cholecystitis without obstruction: Principal | ICD-10-CM | POA: Insufficient documentation

## 2023-04-19 DIAGNOSIS — R109 Unspecified abdominal pain: Secondary | ICD-10-CM | POA: Diagnosis present

## 2023-04-19 DIAGNOSIS — Z7722 Contact with and (suspected) exposure to environmental tobacco smoke (acute) (chronic): Secondary | ICD-10-CM | POA: Insufficient documentation

## 2023-04-19 LAB — COMPREHENSIVE METABOLIC PANEL WITH GFR
ALT: 15 U/L (ref 0–44)
AST: 27 U/L (ref 15–41)
Albumin: 4.3 g/dL (ref 3.5–5.0)
Alkaline Phosphatase: 77 U/L (ref 38–126)
Anion gap: 13 (ref 5–15)
BUN: 9 mg/dL (ref 6–20)
CO2: 22 mmol/L (ref 22–32)
Calcium: 10 mg/dL (ref 8.9–10.3)
Chloride: 104 mmol/L (ref 98–111)
Creatinine, Ser: 0.54 mg/dL (ref 0.44–1.00)
GFR, Estimated: >60 mL/min (ref >60)
Glucose, Bld: 162 mg/dL — ABNORMAL HIGH (ref 70–99)
Potassium: 3.8 mmol/L (ref 3.5–5.1)
Sodium: 139 mmol/L (ref 135–145)
Total Bilirubin: 0.8 mg/dL (ref 0.0–1.2)
Total Protein: 8.8 g/dL — ABNORMAL HIGH (ref 6.5–8.1)

## 2023-04-19 LAB — CBC
HCT: 38.5 % (ref 36.0–46.0)
Hemoglobin: 12.3 g/dL (ref 12.0–15.0)
MCH: 28.5 pg (ref 26.0–34.0)
MCHC: 31.9 g/dL (ref 30.0–36.0)
MCV: 89.3 fL (ref 80.0–100.0)
Platelets: 352 10*3/uL (ref 150–400)
RBC: 4.31 MIL/uL (ref 3.87–5.11)
RDW: 15.6 % — ABNORMAL HIGH (ref 11.5–15.5)
WBC: 7.5 10*3/uL (ref 4.0–10.5)
nRBC: 0 % (ref 0.0–0.2)

## 2023-04-19 LAB — TROPONIN I (HIGH SENSITIVITY): Troponin I (High Sensitivity): <2 ng/L (ref <18)

## 2023-04-19 LAB — LIPASE, BLOOD: Lipase: 38 U/L (ref 11–51)

## 2023-04-19 MED ORDER — ONDANSETRON HCL 4 MG/2ML IJ SOLN
4.0000 mg | Freq: Once | INTRAMUSCULAR | Status: AC
Start: 2023-04-19 — End: 2023-04-19
  Administered 2023-04-19: 4 mg via INTRAVENOUS
  Filled 2023-04-19: qty 2

## 2023-04-19 MED ORDER — ACETAMINOPHEN 650 MG RE SUPP: 650.0000 mg | Freq: Four times a day (QID) | RECTAL | Status: DC | PRN

## 2023-04-19 MED ORDER — MORPHINE SULFATE (PF) 4 MG/ML IV SOLN
4.0000 mg | Freq: Once | INTRAVENOUS | Status: AC
  Administered 2023-04-19: 4 mg via INTRAVENOUS
  Filled 2023-04-19: qty 1

## 2023-04-19 MED ORDER — SODIUM CHLORIDE 0.9 % IV SOLN: INTRAVENOUS | Status: AC

## 2023-04-19 MED ORDER — MORPHINE SULFATE (PF) 2 MG/ML IV SOLN
2.0000 mg | INTRAVENOUS | Status: DC | PRN
  Administered 2023-04-19 – 2023-04-21 (×6): 2 mg via INTRAVENOUS
  Filled 2023-04-19 (×6): qty 1

## 2023-04-19 MED ORDER — SODIUM CHLORIDE 0.9 % IV SOLN: 25.0000 mg | Freq: Four times a day (QID) | INTRAVENOUS | Status: DC | PRN

## 2023-04-19 MED ORDER — SODIUM CHLORIDE 0.9 % IV BOLUS
1000.0000 mL | Freq: Once | INTRAVENOUS | Status: AC
  Administered 2023-04-19: 1000 mL via INTRAVENOUS

## 2023-04-19 MED ORDER — SODIUM CHLORIDE 0.9 % IV SOLN
2.0000 g | Freq: Once | INTRAVENOUS | Status: AC
  Administered 2023-04-19: 2 g via INTRAVENOUS
  Filled 2023-04-19: qty 20

## 2023-04-19 MED ORDER — ONDANSETRON HCL 4 MG/2ML IJ SOLN
4.0000 mg | Freq: Three times a day (TID) | INTRAMUSCULAR | Status: DC | PRN
  Administered 2023-04-19: 4 mg via INTRAVENOUS
  Filled 2023-04-19: qty 2

## 2023-04-19 MED ORDER — DROPERIDOL 2.5 MG/ML IJ SOLN
2.5000 mg | Freq: Once | INTRAMUSCULAR | Status: AC
  Administered 2023-04-19: 2.5 mg via INTRAVENOUS
  Filled 2023-04-19: qty 2

## 2023-04-19 MED ORDER — SODIUM CHLORIDE 0.9 % IV SOLN
2.0000 g | INTRAVENOUS | Status: DC
  Administered 2023-04-20: 2 g via INTRAVENOUS
  Filled 2023-04-19: qty 20

## 2023-04-19 MED ORDER — ENOXAPARIN SODIUM 40 MG/0.4ML IJ SOSY
40.0000 mg | PREFILLED_SYRINGE | INTRAMUSCULAR | Status: DC
  Administered 2023-04-19: 40 mg via SUBCUTANEOUS
  Filled 2023-04-19: qty 0.4

## 2023-04-19 MED ORDER — ACETAMINOPHEN 325 MG PO TABS: 650.0000 mg | ORAL_TABLET | Freq: Four times a day (QID) | ORAL | Status: DC | PRN

## 2023-04-19 MED ORDER — OXYCODONE HCL 5 MG PO TABS
5.0000 mg | ORAL_TABLET | ORAL | Status: DC | PRN
  Administered 2023-04-20 – 2023-04-21 (×3): 10 mg via ORAL
  Filled 2023-04-19 (×3): qty 2

## 2023-04-19 NOTE — ED Provider Notes (Signed)
 [facility] EMERGENCY DEPARTMENT AT [facility] Provider Note   CSN: [id number] Arrival date & time: 04/19/23  1508     History  Chief Complaint  Patient presents with   Nausea   Emesis   Abdominal Pain   Chest Pain    Holly Mendez is a 48 y.o. female with past medical history significant for bipolar disorder, hypertension, obesity, chronic pancreatitis, GERD, intractable nausea and vomiting presents with concern for nausea, vomiting, abdominal pain since this morning.  She reports bilious emesis.  She reports recent history of gallstones.  She denies any fever, chills.  She reports pain 10/10.  She denies any dysuria, vaginal discharge.   Emesis Associated symptoms: abdominal pain   Abdominal Pain Associated symptoms: chest pain and vomiting   Chest Pain Associated symptoms: abdominal pain and vomiting        Home Medications Prior to Admission medications   Medication Sig Start Date End Date Taking? Authorizing Provider  aspirin 81 MG chewable tablet Chew 1 tablet (81 mg total) by mouth 2 (two) times daily. 03/08/23   Bronson Canny, PA-C  cetirizine (ZYRTEC) 10 MG tablet Take 10 mg by mouth in the morning.    [provider]  cloNIDine (CATAPRES - DOSED IN MG/24 HR) 0.1 mg/24hr patch PLACE 1 PATCH ONTO SKIN ONCE WEEKLY. Patient taking differently: Place 0.1 mg onto the skin every Sunday. PLACE 1 PATCH ONTO SKIN ONCE WEEKLY. 06/07/22   Charise Companion, MD  divalproex (DEPAKOTE) 500 MG DR tablet Take 500 mg by mouth 2 (two) times daily. 10/05/22   [provider]  esomeprazole (NEXIUM) 40 MG capsule Take 1 capsule (40 mg total) by mouth 2 (two) times daily before a meal. 03/27/23   Sedalia Dacosta, MD  fluconazole (DIFLUCAN) 200 MG tablet TAKE 1 TABLET BY MOUTH EVERY DAY UNTIL GONE 04/12/23   Charise Companion, MD  gabapentin (NEURONTIN) 300 MG capsule Take 600 mg by mouth at bedtime.    [provider]  HYDROcodone-acetaminophen  (NORCO/VICODIN) 5-325 MG tablet TAKE TWO TABLETS BY MOUTH EVERY MORNING, 1 TAB MIDDAY AND 1 TAB AT NIGHT 04/16/23   Charise Companion, MD  NARCAN 4 MG/0.1ML LIQD nasal spray kit Place 1 spray into the nose daily as needed (overdose). 02/15/22   Charise Companion, MD  polyethylene glycol powder Fernanda Howell) 17 GM/SCOOP powder Take one or two capfuls daily by mouth as directed for constipation Patient taking differently: Take 17 g by mouth daily as needed for moderate constipation. 10/25/21   Charise Companion, MD  QUEtiapine (SEROQUEL) 400 MG tablet Take 400 mg by mouth at bedtime.    [provider]  sucralfate (CARAFATE) 1 GM/10ML suspension Take 10 mLs (1 g total) by mouth every 6 (six) hours. 03/27/23   Rizwan, Saima, MD  SYMBICORT 80-4.5 MCG/ACT inhaler INHALE 2 PUFFS INTO THE LUNGS TWICE DAILY AS NEEDED. 12/27/22   Charise Companion, MD  tiZANidine (ZANAFLEX) 4 MG tablet Take 1 tablet (4 mg total) by mouth every 6 (six) hours as needed for muscle spasms. 03/08/23   Bronson Canny, PA-C  topiramate (TOPAMAX) 100 MG tablet Take 1 tablet (100 mg total) by mouth 2 (two) times daily. By psychiatry Patient taking differently: Take 100 mg by mouth at bedtime. By psychiatry 01/25/23   Charise Companion, MD  VENTOLIN HFA 108 (90 Base) MCG/ACT inhaler INHALE TWO PUFFS INTO THE LUNGS EVERY 6 HOURS AS NEEDED WHEEZING OR SHORTNESS OF BREATH 12/27/22  Charise Companion, MD      Allergies    Lyrica [pregabalin], Celebrex [celecoxib], Gabapentin, Naproxen, Norvasc [amlodipine besylate], Zithromax [azithromycin], and Nabumetone    Review of Systems   Review of Systems  Cardiovascular:  Positive for chest pain.  Gastrointestinal:  Positive for abdominal pain and vomiting.  All other systems reviewed and are negative.   Physical Exam Updated Vital Signs BP (!) 143/98   Pulse (!) 108   Temp 98.5 F (36.9 C) (Oral)   Resp 18   Ht 5\' 5"  (1.651 m)   Wt 99 kg   LMP 08/31/2018   SpO2 100%   BMI 36.32 kg/m  Physical Exam Vitals  and nursing note reviewed.  Constitutional:      General: She is not in acute distress.    Appearance: Normal appearance.  HENT:     Head: Normocephalic and atraumatic.  Eyes:     General:        Right eye: No discharge.        Left eye: No discharge.  Cardiovascular:     Rate and Rhythm: Regular rhythm. Tachycardia present.     Heart sounds: No murmur heard.    No friction rub. No gallop.  Pulmonary:     Effort: Pulmonary effort is normal.     Breath sounds: Normal breath sounds.  Abdominal:     General: Bowel sounds are normal.     Palpations: Abdomen is soft.     Comments: Diffuse ttp throughout protuberant abdomen, no rebound, rigidity, guarding. Epigastric to RUQ pain worst on exam. Normal bowel sounds.  Skin:    General: Skin is warm and dry.     Capillary Refill: Capillary refill takes less than 2 seconds.  Neurological:     Mental Status: She is alert and oriented to person, place, and time.  Psychiatric:        Mood and Affect: Mood normal.        Behavior: Behavior normal.    ED Results / Procedures / Treatments   Labs (all labs ordered are listed, but only abnormal results are displayed) Labs Reviewed  CBC - Abnormal; Notable for the following components:      Result Value   RDW 15.6 (*)    All other components within normal limits  LIPASE, BLOOD  COMPREHENSIVE METABOLIC PANEL WITH GFR  URINALYSIS, ROUTINE W REFLEX MICROSCOPIC  TROPONIN I (HIGH SENSITIVITY)    EKG None  Radiology No results found.  Procedures Procedures    Medications Ordered in ED Medications  ondansetron (ZOFRAN) injection 4 mg (has no administration in time range)  sodium chloride 0.9 % bolus 1,000 mL (has no administration in time range)  morphine (PF) 4 MG/ML injection 4 mg (has no administration in time range)    ED Course/ Medical Decision Making/ A&P                                 Medical Decision Making Amount and/or Complexity of Data Reviewed Labs:  ordered. Radiology: ordered.  Risk Prescription drug management.   This patient is a 48 y.o. female  who presents to the ED for concern of abdominal pain, nausea, vomiting.   Differential diagnoses prior to evaluation: The emergent differential diagnosis includes, but is not limited to,  The causes of generalized abdominal pain include but are not limited to AAA, mesenteric ischemia, appendicitis, diverticulitis, DKA, gastritis, gastroenteritis, AMI, nephrolithiasis, pancreatitis, peritonitis,  adrenal insufficiency,lead poisoning, iron toxicity, intestinal ischemia, constipation, UTI,SBO/LBO, splenic rupture, biliary disease, IBD, IBS, PUD, or hepatitis . This is not an exhaustive differential.   Past Medical History / Co-morbidities / Social History: bipolar disorder, hypertension, obesity, chronic pancreatitis, GERD, intractable nausea and vomiting  Additional history: Chart reviewed. Pertinent results include: Reviewed lab work, imaging from previous emergency department visits, notably with recent admission for intractable nausea, vomiting, abdominal pain, and hypokalemia in March.  Physical Exam: Physical exam performed. The pertinent findings include: : Diffuse ttp throughout protuberant abdomen, no rebound, rigidity, guarding. Epigastric to RUQ pain worst on exam. Normal bowel sounds.   Tachycardia on arrival, pulse of 108, she is hypertensive with blood pressure 143/98.  Lab Tests/Imaging studies: I personally interpreted labs/imaging and the pertinent results include: CMP with mildly elevated glucose, 162, otherwise unremarkable, CBC unremarkable, initial troponin negative, undetectable, no active chest pain, do not think that delta troponin is indicated.  Normal lipase 38, I dependently interpreted right upper quadrant ultrasound which shows multiple gallstones, findings equivocal for with cholecystitis, positive sonographic murphy sign. Clinically with high suspicion. Will plan to  admit, HIDA scan. I agree with the radiologist interpretation.  Cardiac monitoring: EKG obtained and interpreted by myself and attending physician which shows: Sinus tachycardia, no acute ST-T changes.   Medications: I ordered medication including morphine, Zofran, fluid bolus for pain, nausea, vomiting, droperidol for intractable vomiting.  I have reviewed the patients home medicines and have made adjustments as needed.  Begin Rocephin to treat for acute cholecystitis.  Consults: I spoke with general surgeon, Dr. Delane Fear who will plan for surgery admission at this time, who after evaluation of patient's physical exam, lab work, imaging findings will plan to consult during her admission, I consulted with the hospitalist who agrees to admit for acute cholecystitis, intractable nausea, vomiting.   Disposition: After consideration of the diagnostic results and the patients response to treatment, I feel that patient would benefit from admission as discussed above for findings suspicious for acute cholecystitis..   Final Clinical Impression(s) / ED Diagnoses Final diagnoses:  None    Rx / DC Orders ED Discharge Orders     None         Stefan Edge 04/19/23 1909    Lind Repine, MD 04/24/23 1243

## 2023-04-19 NOTE — Plan of Care (Signed)

## 2023-04-19 NOTE — ED Triage Notes (Signed)
 Pt BIBA from home. C/o N/V and abd pain since this AM.   Hx pancreatitis.  Bilious emesis
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
## 2023-04-20 ENCOUNTER — Encounter (HOSPITAL_COMMUNITY)
Admission: EM | Disposition: A | Discharge status: Home / Self Care | Payer: Self-pay | Source: Home / Self Care | Attending: Emergency Medicine

## 2023-04-20 ENCOUNTER — Observation Stay (HOSPITAL_COMMUNITY): Payer: MEDICAID | Admitting: Certified Registered Nurse Anesthetist

## 2023-04-20 ENCOUNTER — Observation Stay (HOSPITAL_BASED_OUTPATIENT_CLINIC_OR_DEPARTMENT_OTHER): Payer: MEDICAID | Admitting: Certified Registered Nurse Anesthetist

## 2023-04-20 ENCOUNTER — Encounter (HOSPITAL_COMMUNITY): Payer: Self-pay

## 2023-04-20 ENCOUNTER — Other Ambulatory Visit: Payer: Self-pay

## 2023-04-20 DIAGNOSIS — K8020 Calculus of gallbladder without cholecystitis without obstruction: Secondary | ICD-10-CM

## 2023-04-20 HISTORY — PX: CHOLECYSTECTOMY: SHX55

## 2023-04-20 LAB — GLUCOSE, CAPILLARY: Glucose-Capillary: 144 mg/dL — ABNORMAL HIGH (ref 70–99)

## 2023-04-20 SURGERY — LAPAROSCOPIC CHOLECYSTECTOMY
Anesthesia: General

## 2023-04-20 MED ORDER — FENTANYL CITRATE (PF) 100 MCG/2ML IJ SOLN
INTRAMUSCULAR | Status: DC | PRN
  Administered 2023-04-20: 100 ug via INTRAVENOUS

## 2023-04-20 MED ORDER — PHENYLEPHRINE 80 MCG/ML (10ML) SYRINGE FOR IV PUSH (FOR BLOOD PRESSURE SUPPORT)
PREFILLED_SYRINGE | INTRAVENOUS | Status: AC
  Filled 2023-04-20: qty 10

## 2023-04-20 MED ORDER — LACTATED RINGERS IV SOLN: INTRAVENOUS | Status: DC

## 2023-04-20 MED ORDER — ROCURONIUM BROMIDE 10 MG/ML (PF) SYRINGE
PREFILLED_SYRINGE | INTRAVENOUS | Status: DC | PRN
  Administered 2023-04-20: 40 mg via INTRAVENOUS

## 2023-04-20 MED ORDER — CHLORHEXIDINE GLUCONATE 0.12 % MT SOLN
15.0000 mL | Freq: Once | OROMUCOSAL | Status: AC
  Administered 2023-04-20: 15 mL via OROMUCOSAL

## 2023-04-20 MED ORDER — ACETAMINOPHEN 500 MG PO TABS: 1000.0000 mg | ORAL_TABLET | Freq: Once | ORAL | Status: DC

## 2023-04-20 MED ORDER — BUPIVACAINE-EPINEPHRINE (PF) 0.25% -1:200000 IJ SOLN
INTRAMUSCULAR | Status: DC | PRN
  Administered 2023-04-20: 19 mL

## 2023-04-20 MED ORDER — DEXAMETHASONE SODIUM PHOSPHATE 10 MG/ML IJ SOLN
INTRAMUSCULAR | Status: DC | PRN
  Administered 2023-04-20: 10 mg via INTRAVENOUS

## 2023-04-20 MED ORDER — INDOCYANINE GREEN 25 MG IV SOLR
1.2500 mg | INTRAVENOUS | Status: AC
  Administered 2023-04-20: 1.25 mg via INTRAVENOUS
  Filled 2023-04-20: qty 10

## 2023-04-20 MED ORDER — LIDOCAINE HCL (PF) 2 % IJ SOLN
INTRAMUSCULAR | Status: DC | PRN
  Administered 2023-04-20: 100 mg via INTRADERMAL

## 2023-04-20 MED ORDER — ENOXAPARIN SODIUM 40 MG/0.4ML IJ SOSY
40.0000 mg | PREFILLED_SYRINGE | INTRAMUSCULAR | Status: DC
  Administered 2023-04-21: 40 mg via SUBCUTANEOUS
  Filled 2023-04-20: qty 0.4

## 2023-04-20 MED ORDER — PHENYLEPHRINE 80 MCG/ML (10ML) SYRINGE FOR IV PUSH (FOR BLOOD PRESSURE SUPPORT)
PREFILLED_SYRINGE | INTRAVENOUS | Status: DC | PRN
  Administered 2023-04-20 (×2): 160 ug via INTRAVENOUS

## 2023-04-20 MED ORDER — LIDOCAINE 2% (20 MG/ML) 5 ML SYRINGE
INTRAMUSCULAR | Status: DC | PRN
  Administered 2023-04-20: 100 mg via INTRAVENOUS

## 2023-04-20 MED ORDER — 0.9 % SODIUM CHLORIDE (POUR BTL) OPTIME
TOPICAL | Status: DC | PRN
  Administered 2023-04-20: 1000 mL

## 2023-04-20 MED ORDER — SUCCINYLCHOLINE CHLORIDE 200 MG/10ML IV SOSY
PREFILLED_SYRINGE | INTRAVENOUS | Status: AC
  Filled 2023-04-20: qty 10

## 2023-04-20 MED ORDER — MIDAZOLAM HCL 2 MG/2ML IJ SOLN
INTRAMUSCULAR | Status: AC
  Filled 2023-04-20: qty 2

## 2023-04-20 MED ORDER — ORAL CARE MOUTH RINSE: 15.0000 mL | Freq: Once | OROMUCOSAL | Status: AC

## 2023-04-20 MED ORDER — PROPOFOL 10 MG/ML IV BOLUS
INTRAVENOUS | Status: AC
Start: 2023-04-20 — End: ?
  Filled 2023-04-20: qty 20

## 2023-04-20 MED ORDER — METHOCARBAMOL 500 MG PO TABS
500.0000 mg | ORAL_TABLET | Freq: Four times a day (QID) | ORAL | Status: DC | PRN
  Administered 2023-04-20 – 2023-04-21 (×2): 500 mg via ORAL
  Filled 2023-04-20 (×2): qty 1

## 2023-04-20 MED ORDER — MIDAZOLAM HCL 5 MG/5ML IJ SOLN
INTRAMUSCULAR | Status: DC | PRN
  Administered 2023-04-20: 2 mg via INTRAVENOUS

## 2023-04-20 MED ORDER — ONDANSETRON HCL 4 MG/2ML IJ SOLN
4.0000 mg | Freq: Four times a day (QID) | INTRAMUSCULAR | Status: DC | PRN
  Administered 2023-04-20 – 2023-04-21 (×3): 4 mg via INTRAVENOUS
  Filled 2023-04-20 (×3): qty 2

## 2023-04-20 MED ORDER — DROPERIDOL 2.5 MG/ML IJ SOLN: 0.6250 mg | Freq: Once | INTRAMUSCULAR | Status: DC | PRN

## 2023-04-20 MED ORDER — ACETAMINOPHEN 500 MG PO TABS
1000.0000 mg | ORAL_TABLET | Freq: Four times a day (QID) | ORAL | Status: DC
  Administered 2023-04-20 – 2023-04-21 (×4): 1000 mg via ORAL
  Filled 2023-04-20 (×4): qty 2

## 2023-04-20 MED ORDER — SUCCINYLCHOLINE CHLORIDE 200 MG/10ML IV SOSY
PREFILLED_SYRINGE | INTRAVENOUS | Status: DC | PRN
  Administered 2023-04-20: 100 mg via INTRAVENOUS

## 2023-04-20 MED ORDER — FENTANYL CITRATE (PF) 100 MCG/2ML IJ SOLN
INTRAMUSCULAR | Status: AC
Start: 2023-04-20 — End: ?
  Filled 2023-04-20: qty 2

## 2023-04-20 MED ORDER — SUGAMMADEX SODIUM 200 MG/2ML IV SOLN
INTRAVENOUS | Status: DC | PRN
  Administered 2023-04-20: 200 mg via INTRAVENOUS

## 2023-04-20 MED ORDER — FENTANYL CITRATE PF 50 MCG/ML IJ SOSY
25.0000 ug | PREFILLED_SYRINGE | INTRAMUSCULAR | Status: DC | PRN
  Administered 2023-04-20 (×2): 50 ug via INTRAVENOUS

## 2023-04-20 MED ORDER — BUPIVACAINE-EPINEPHRINE (PF) 0.25% -1:200000 IJ SOLN
INTRAMUSCULAR | Status: AC
  Filled 2023-04-20: qty 30

## 2023-04-20 MED ORDER — FENTANYL CITRATE PF 50 MCG/ML IJ SOSY
PREFILLED_SYRINGE | INTRAMUSCULAR | Status: AC
  Filled 2023-04-20: qty 1

## 2023-04-20 MED ORDER — LACTATED RINGERS IR SOLN
Status: DC | PRN
  Administered 2023-04-20: 1000 mL

## 2023-04-20 MED ORDER — PROPOFOL 10 MG/ML IV BOLUS
INTRAVENOUS | Status: DC | PRN
  Administered 2023-04-20: 150 mg via INTRAVENOUS

## 2023-04-20 MED ORDER — FENTANYL CITRATE PF 50 MCG/ML IJ SOSY
PREFILLED_SYRINGE | INTRAMUSCULAR | Status: AC
  Administered 2023-04-20: 50 ug via INTRAVENOUS
  Filled 2023-04-20: qty 2

## 2023-04-20 MED ORDER — ONDANSETRON HCL 4 MG/2ML IJ SOLN
INTRAMUSCULAR | Status: DC | PRN
  Administered 2023-04-20: 4 mg via INTRAVENOUS

## 2023-04-20 MED ORDER — PROCHLORPERAZINE EDISYLATE 10 MG/2ML IJ SOLN
10.0000 mg | Freq: Four times a day (QID) | INTRAMUSCULAR | Status: DC | PRN
  Administered 2023-04-20 – 2023-04-21 (×2): 10 mg via INTRAVENOUS
  Filled 2023-04-20 (×2): qty 2

## 2023-04-20 SURGICAL SUPPLY — 34 items
APPLIER CLIP 5 13 M/L LIGAMAX5 (MISCELLANEOUS) IMPLANT
BAG COUNTER SPONGE SURGICOUNT (BAG) IMPLANT
CABLE HIGH FREQUENCY MONO STRZ (ELECTRODE) ×1 IMPLANT
CHLORAPREP W/TINT 26 (MISCELLANEOUS) ×1 IMPLANT
CLIP APPLIE 5 13 M/L LIGAMAX5 (MISCELLANEOUS) IMPLANT
CLIP LIGATING HEMO O LOK GREEN (MISCELLANEOUS) ×1 IMPLANT
COVER MAYO STAND XLG (MISCELLANEOUS) IMPLANT
COVER TRANSDUCER ULTRASND (DRAPES) ×1 IMPLANT
DERMABOND ADVANCED .7 DNX12 (GAUZE/BANDAGES/DRESSINGS) ×1 IMPLANT
DRAPE C-ARM 42X120 X-RAY (DRAPES) IMPLANT
ELECT REM PT RETURN 15FT ADLT (MISCELLANEOUS) ×1 IMPLANT
GAUZE SPONGE 2X2 8PLY STRL LF (GAUZE/BANDAGES/DRESSINGS) ×1 IMPLANT
GLOVE BIO SURGEON STRL SZ7.5 (GLOVE) ×1 IMPLANT
GOWN STRL REUS W/ TWL XL LVL3 (GOWN DISPOSABLE) ×1 IMPLANT
GRASPER SUT TROCAR 14GX15 (MISCELLANEOUS) IMPLANT
IRRIG SUCT STRYKERFLOW 2 WTIP (MISCELLANEOUS) ×1 IMPLANT
IRRIGATION SUCT STRKRFLW 2 WTP (MISCELLANEOUS) ×1 IMPLANT
KIT BASIN OR (CUSTOM PROCEDURE TRAY) ×1 IMPLANT
KIT TURNOVER KIT A (KITS) IMPLANT
NDL INSUFFLATION 14GA 120MM (NEEDLE) ×1 IMPLANT
NEEDLE INSUFFLATION 14GA 120MM (NEEDLE) ×1 IMPLANT
PENCIL SMOKE EVACUATOR (MISCELLANEOUS) IMPLANT
POUCH RETRIEVAL ECOSAC 10 (ENDOMECHANICALS) IMPLANT
SCISSORS LAP 5X35 DISP (ENDOMECHANICALS) ×1 IMPLANT
SET CHOLANGIOGRAPH MIX (MISCELLANEOUS) IMPLANT
SET TUBE SMOKE EVAC HIGH FLOW (TUBING) ×1 IMPLANT
SLEEVE Z-THREAD 5X100MM (TROCAR) ×1 IMPLANT
SPIKE FLUID TRANSFER (MISCELLANEOUS) ×1 IMPLANT
SUT MNCRL AB 4-0 PS2 18 (SUTURE) ×1 IMPLANT
SUT VICRYL 0 UR6 27IN ABS (SUTURE) IMPLANT
TOWEL OR 17X26 10 PK STRL BLUE (TOWEL DISPOSABLE) ×1 IMPLANT
TRAY LAPAROSCOPIC (CUSTOM PROCEDURE TRAY) ×1 IMPLANT
TROCAR 11X100 Z THREAD (TROCAR) ×1 IMPLANT
TROCAR Z-THREAD OPTICAL 5X100M (TROCAR) ×1 IMPLANT

## 2023-04-20 NOTE — Interval H&P Note (Signed)
 History and Physical Interval Note:  04/20/2023 8:45 AM  Holly Mendez  has presented today for surgery, with the diagnosis of CHOLELITHIASIS.  The various methods of treatment have been discussed with the patient and family. After consideration of risks, benefits and other options for treatment, the patient has consented to  Procedure(s) with comments: LAPAROSCOPIC CHOLECYSTECTOMY (N/A) - LAP CHOLE WITH ICG DYE as a surgical intervention.  The patient's history has been reviewed, patient examined, no change in status, stable for surgery.  I have reviewed the patient's chart and labs.  Questions were answered to the patient's satisfaction.     [name]

## 2023-04-20 NOTE — TOC Initial Note (Addendum)
 Transition of Care [facility]) - Initial/Assessment Note    Patient Details  Name: Holly Mendez MRN: [id number] Date of Birth: [birth year]  [facility] CM/SW Contact:    Jessie Foot, RN Phone Number: 04/20/2023, 5:22 PM  Clinical Narrative:                 Presented for nausea and vomiting. When NCM entered room patient in fetal position stating she is sleeping. In review of chart, SDOH needs reviewed.TOC will follow for SDOH risk that have not been addressed since pt sleeping.        Patient Goals and CMS Choice            Expected Discharge Plan and Services                                              Prior Living Arrangements/Services                       Activities of Daily Living   ADL Screening (condition at time of admission) Independently performs ADLs?: No Does the patient have a NEW difficulty with bathing/dressing/toileting/self-feeding that is expected to last >3 days?: Yes (Initiates electronic notice to provider for possible OT consult) Does the patient have a NEW difficulty with getting in/out of bed, walking, or climbing stairs that is expected to last >3 days?: Yes (Initiates electronic notice to provider for possible PT consult) Does the patient have a NEW difficulty with communication that is expected to last >3 days?: No Is the patient deaf or have difficulty hearing?: No Does the patient have difficulty seeing, even when wearing glasses/contacts?: No Does the patient have difficulty concentrating, remembering, or making decisions?: No  Permission Sought/Granted                  Emotional Assessment              Admission diagnosis:  Cholecystitis [K81.9] Patient Active Problem List   Diagnosis Date Noted   Cholecystitis 04/19/2023   Hypokalemia 03/23/2023   Intractable nausea and vomiting 03/23/2023   Schizoaffective disorder (HCC) 03/23/2023   Bipolar disorder (HCC) 03/23/2023   GERD  (gastroesophageal reflux disease) 03/23/2023   Altered mental status-resolved 03/23/2023   Nausea and vomiting 03/23/2023   Status post total right knee replacement 03/06/2023   Right knee injury, sequela 08/03/2022   Gait abnormality 04/27/2022   Osteoarthritis of right knee 04/27/2022   Injury of meniscus of left knee 09/08/2021   Left knee pain 08/11/2021   Aftercare following right knee joint replacement surgery 05/27/2020   Chronic pancreatitis (HCC) 12/01/2019   H/O total vaginal hysterectomy 05/31/2019   Spondylosis without myelopathy or radiculopathy, lumbosacral region 04/18/2017   Vitamin D deficiency 04/18/2017   Chronic pain syndrome 04/04/2017   Problems influencing health status 04/04/2017   Bacterial vaginosis, recurrent    Blind left eye 12/10/2014   Possiblle Anterior communicating artery aneurysm 10/30/2014   Hyperglycemia 10/17/2014   Morbid obesity (HCC) 09/24/2014   Meningitis, hx, 2016 09/21/2014   Reactive airway disease 11/21/2013   Depression 11/21/2013   Seasonal allergies 11/21/2013   Bipolar affective disorder, currently in remission (HCC) 02/04/2011   Hypertension 02/04/2011   PCP:  Nestor Ramp, MD Pharmacy:   [facility] - [place], [place] - [address] [place] [address] Phone: [phone] Fax: [phone]     Social Drivers of Health (SDOH) Social History: SDOH Screenings   Food Insecurity: No Food Insecurity (04/19/2023)  Recent Concern: Food Insecurity - Food Insecurity Present (03/23/2023)  Housing: Low Risk  (04/19/2023)  Transportation Needs: Unmet Transportation Needs (04/19/2023)  Utilities: Not At Risk (04/19/2023)  Depression (PHQ2-9): High Risk (01/24/2023)  Social Connections: Socially Isolated (04/19/2023)  Tobacco Use: Low Risk  (04/20/2023)  Recent Concern: Tobacco Use - Medium Risk (02/12/2023)   SDOH Interventions: Transportation Interventions: Patient Unable to Answer (Patient states  sleeping)   Readmission Risk Interventions     No data to display

## 2023-04-20 NOTE — Transfer of Care (Signed)
 Immediate Anesthesia Transfer of Care Note  Patient: Holly Mendez  Procedure(s) Performed: Procedure(s) with comments: LAPAROSCOPIC CHOLECYSTECTOMY (N/A) - LAP CHOLE WITH ICG DYE  Patient Location: PACU  Anesthesia Type:General  Level of Consciousness: Alert, Awake, Oriented  Airway & Oxygen Therapy: Patient Spontanous Breathing  Post-op Assessment: Report given to RN  Post vital signs: Reviewed and stable  Last Vitals:  Vitals:   04/20/23 0908 04/20/23 1126  BP: (!) 155/97 (!) 158/97  Pulse: 97 94  Resp: 19 18  Temp: 36.9 C (!) 36 C  SpO2: 100% 97%    Complications: No apparent anesthesia complications

## 2023-04-20 NOTE — Anesthesia Preprocedure Evaluation (Addendum)
 Anesthesia Evaluation  Patient identified by MRN, date of birth, ID band Patient awake    Reviewed: Allergy & Precautions, NPO status , Patient's Chart, lab work & pertinent test results  Airway Mallampati: II  TM Distance: >3 FB Neck ROM: Full    Dental no notable dental hx.    Pulmonary neg pulmonary ROS   Pulmonary exam normal        Cardiovascular hypertension,  Rhythm:Regular Rate:Normal     Neuro/Psych   Anxiety Depression Bipolar Disorder Schizophrenia  negative neurological ROS     GI/Hepatic Neg liver ROS,GERD  Medicated,,Cholelithiasis    Endo/Other  negative endocrine ROS    Renal/GU negative Renal ROS  negative genitourinary   Musculoskeletal  (+) Arthritis ,  Fibromyalgia -  Abdominal Normal abdominal exam  (+)   Peds  Hematology   Anesthesia Other Findings   Reproductive/Obstetrics                             Anesthesia Physical Anesthesia Plan  ASA: 2  Anesthesia Plan: General   Post-op Pain Management: Celebrex PO (pre-op)* and Tylenol PO (pre-op)*   Induction: Intravenous and Rapid sequence  PONV Risk Score and Plan: 3 and Ondansetron, Dexamethasone, Midazolam and Treatment may vary due to age or medical condition  Airway Management Planned: Mask and Oral ETT  Additional Equipment: None  Intra-op Plan:   Post-operative Plan: Extubation in OR  Informed Consent: I have reviewed the patients History and Physical, chart, labs and discussed the procedure including the risks, benefits and alternatives for the proposed anesthesia with the patient or authorized representative who has indicated his/her understanding and acceptance.     Dental advisory given  Plan Discussed with: CRNA  Anesthesia Plan Comments:        Anesthesia Quick Evaluation

## 2023-04-20 NOTE — Anesthesia Procedure Notes (Signed)
 Procedure Name: Intubation Date/Time: 04/20/2023 12:03 PM  Performed by: Basilio Cairo, CRNAPre-anesthesia Checklist: Patient identified, Patient being monitored, Timeout performed, Emergency Drugs available and Suction available Patient Re-evaluated:Patient Re-evaluated prior to induction Oxygen Delivery Method: Circle system utilized Preoxygenation: Pre-oxygenation with 100% oxygen Induction Type: IV induction and Rapid sequence Ventilation: Mask ventilation without difficulty Laryngoscope Size: Mac and 3 Grade View: Grade I Tube type: Oral Tube size: 7.0 mm Number of attempts: 1 Placement Confirmation: ETT inserted through vocal cords under direct vision, positive ETCO2 and breath sounds checked- equal and bilateral Secured at: 21 cm Tube secured with: Tape Dental Injury: Teeth and Oropharynx as per pre-operative assessment

## 2023-04-20 NOTE — Anesthesia Postprocedure Evaluation (Signed)
 Anesthesia Post Note  Patient: Holly Mendez  Procedure(s) Performed: LAPAROSCOPIC CHOLECYSTECTOMY     Patient location during evaluation: PACU Anesthesia Type: General Level of consciousness: awake and alert Pain management: pain level controlled Vital Signs Assessment: post-procedure vital signs reviewed and stable Respiratory status: spontaneous breathing, nonlabored ventilation, respiratory function stable and patient connected to nasal cannula oxygen Cardiovascular status: blood pressure returned to baseline and stable Postop Assessment: no apparent nausea or vomiting Anesthetic complications: no   No notable events documented.  Last Vitals:  Vitals:   04/20/23 1415 04/20/23 1433  BP: (!) 164/107 (!) 169/95  Pulse: 85 81  Resp: 18   Temp:  36.7 C  SpO2: 99% 100%    Last Pain:  Vitals:   04/20/23 1622  TempSrc:   PainSc: 3                  Abryanna Musolino P Wilhemina Grall

## 2023-04-20 NOTE — Discharge Instructions (Signed)
 [facility]  Please arrive at least 30 min before your appointment to complete your check in paperwork.  If you are unable to arrive 30 min prior to your appointment time we may have to cancel or reschedule you. LAPAROSCOPIC SURGERY: POST OP INSTRUCTIONS Always review your discharge instruction sheet given to you by the facility where your surgery was performed. IF YOU HAVE DISABILITY OR FAMILY LEAVE FORMS, YOU MUST BRING THEM TO THE OFFICE FOR PROCESSING.   DO NOT GIVE THEM TO YOUR DOCTOR.  PAIN CONTROL  First take acetaminophen (Tylenol) AND/or ibuprofen (Advil) to control your pain after surgery.  Follow directions on package.  Taking acetaminophen (Tylenol) and/or ibuprofen (Advil) regularly after surgery will help to control your pain and lower the amount of prescription pain medication you may need.  You should not take more than 4,000 mg (4 grams) of acetaminophen (Tylenol) in 24 hours.  You should not take ibuprofen (Advil), aleve, motrin, naprosyn or other NSAIDS if you have a history of stomach ulcers or chronic kidney disease.  A prescription for pain medication may be given to you upon discharge.  Take your pain medication as prescribed, if you still have uncontrolled pain after taking acetaminophen (Tylenol) or ibuprofen (Advil). Use ice packs to help control pain. If you need a refill on your pain medication, please contact your pharmacy.  They will contact our office to request authorization. Prescriptions will not be filled after 5pm or on week-ends.  HOME MEDICATIONS Take your usually prescribed medications unless otherwise directed.  DIET You should follow a light diet the first few days after arrival home.  Be sure to include lots of fluids daily. Avoid fatty, fried foods.   CONSTIPATION It is common to experience some constipation after surgery and if you are taking pain medication.  Increasing fluid intake and taking a stool softener (such as Colace)  will usually help or prevent this problem from occurring.  A mild laxative (Milk of Magnesia or Miralax) should be taken according to package instructions if there are no bowel movements after 48 hours.  WOUND/INCISION CARE Most patients will experience some swelling and bruising in the area of the incisions.  Ice packs will help.  Swelling and bruising can take several days to resolve.  Unless discharge instructions indicate otherwise, follow guidelines below  STERI-STRIPS - you may remove your outer bandages 48 hours after surgery, and you may shower at that time.  You have steri-strips (small skin tapes) in place directly over the incision.  These strips should be left on the skin for 7-10 days.   DERMABOND/SKIN GLUE - you may shower in 24 hours.  The glue will flake off over the next 2-3 weeks. Any sutures or staples will be removed at the office during your follow-up visit.  ACTIVITIES You may resume regular (light) daily activities beginning the next day--such as daily self-care, walking, climbing stairs--gradually increasing activities as tolerated.  You may have sexual intercourse when it is comfortable.  Refrain from any heavy lifting or straining until approved by your doctor. You may drive when you are no longer taking prescription pain medication, you can comfortably wear a seatbelt, and you can safely maneuver your car and apply brakes.  FOLLOW-UP You should see your doctor in the office for a follow-up appointment approximately 2-3 weeks after your surgery.  You should have been given your post-op/follow-up appointment when your surgery was scheduled.  If you did not receive a post-op/follow-up appointment, make sure  that you call for this appointment within a day or two after you arrive home to insure a convenient appointment time.   WHEN TO CALL YOUR DOCTOR: Fever over 101.0 Inability to urinate Continued bleeding from incision. Increased pain, redness, or drainage from the  incision. Increasing abdominal pain  The clinic staff is available to answer your questions during regular business hours.  Please dont hesitate to call and ask to speak to one of the nurses for clinical concerns.  If you have a medical emergency, go to the nearest emergency room or call 911.  A surgeon from [facility] is always on call at the hospital. [address], [address], [place], [place]  [address] ? [address] [address], [place], [place]   [address] [phone] ? [phone] ? FAX [phone]     Managing Your Pain After Surgery Without Opioids    Thank you for participating in our program to help patients manage their pain after surgery without opioids. This is part of our effort to provide you with the best care possible, without exposing you or your family to the risk that opioids pose.  What pain can I expect after surgery? You can expect to have some pain after surgery. This is normal. The pain is typically worse the day after surgery, and quickly begins to get better. Many studies have found that many patients are able to manage their pain after surgery with Over-the-Counter (OTC) medications such as Tylenol and Motrin. If you have a condition that does not allow you to take Tylenol or Motrin, notify your surgical team.  How will I manage my pain? The best strategy for controlling your pain after surgery is around the clock pain control with Tylenol (acetaminophen) and Motrin (ibuprofen or Advil). Alternating these medications with each other allows you to maximize your pain control. In addition to Tylenol and Motrin, you can use heating pads or ice packs on your incisions to help reduce your pain.  How will I alternate your regular strength over-the-counter pain medication? You will take a dose of pain medication every three hours. Start by taking 650 mg of Tylenol (2 pills of 325 mg) 3 hours later take 600 mg of Motrin (3 pills of 200 mg) 3 hours after  taking the Motrin take 650 mg of Tylenol 3 hours after that take 600 mg of Motrin.   - 1 -  See example - if your first dose of Tylenol is at 12:00 PM   12:00 PM Tylenol 650 mg (2 pills of 325 mg)  3:00 PM Motrin 600 mg (3 pills of 200 mg)  6:00 PM Tylenol 650 mg (2 pills of 325 mg)  9:00 PM Motrin 600 mg (3 pills of 200 mg)  Continue alternating every 3 hours   We recommend that you follow this schedule around-the-clock for at least 3 days after surgery, or until you feel that it is no longer needed. Use the table on the last page of this handout to keep track of the medications you are taking. Important: Do not take more than 3000mg  of Tylenol or 3200mg  of Motrin in a 24-hour period. Do not take ibuprofen/Motrin if you have a history of bleeding stomach ulcers, severe kidney disease, &/or actively taking a blood thinner  What if I still have pain? If you have pain that is not controlled with the over-the-counter pain medications (Tylenol and Motrin or Advil) you might have what we call breakthrough pain. You will receive a prescription  for a small amount of an opioid pain medication such as Oxycodone, Tramadol, or Tylenol with Codeine. Use these opioid pills in the first 24 hours after surgery if you have breakthrough pain. Do not take more than 1 pill every 4-6 hours.  If you still have uncontrolled pain after using all opioid pills, don't hesitate to call our staff using the number provided. We will help make sure you are managing your pain in the best way possible, and if necessary, we can provide a prescription for additional pain medication.   Day 1    Time  Name of Medication Number of pills taken  Amount of Acetaminophen  Pain Level   Comments  AM PM       AM PM       AM PM       AM PM       AM PM       AM PM       AM PM       AM PM       Total Daily amount of Acetaminophen Do not take more than  3,000 mg per day      Day 2    Time  Name of Medication  Number of pills taken  Amount of Acetaminophen  Pain Level   Comments  AM PM       AM PM       AM PM       AM PM       AM PM       AM PM       AM PM       AM PM       Total Daily amount of Acetaminophen Do not take more than  3,000 mg per day      Day 3    Time  Name of Medication Number of pills taken  Amount of Acetaminophen  Pain Level   Comments  AM PM       AM PM       AM PM       AM PM          AM PM       AM PM       AM PM       AM PM       Total Daily amount of Acetaminophen Do not take more than  3,000 mg per day      Day 4    Time  Name of Medication Number of pills taken  Amount of Acetaminophen  Pain Level   Comments  AM PM       AM PM       AM PM       AM PM       AM PM       AM PM       AM PM       AM PM       Total Daily amount of Acetaminophen Do not take more than  3,000 mg per day      Day 5    Time  Name of Medication Number of pills taken  Amount of Acetaminophen  Pain Level   Comments  AM PM       AM PM       AM PM       AM PM       AM PM       AM  PM       AM PM       AM PM       Total Daily amount of Acetaminophen Do not take more than  3,000 mg per day       Day 6    Time  Name of Medication Number of pills taken  Amount of Acetaminophen  Pain Level  Comments  AM PM       AM PM       AM PM       AM PM       AM PM       AM PM       AM PM       AM PM       Total Daily amount of Acetaminophen Do not take more than  3,000 mg per day      Day 7    Time  Name of Medication Number of pills taken  Amount of Acetaminophen  Pain Level   Comments  AM PM       AM PM       AM PM       AM PM       AM PM       AM PM       AM PM       AM PM       Total Daily amount of Acetaminophen Do not take more than  3,000 mg per day        For additional information about how and where to safely dispose of unused opioid medications - [employer]  Disclaimer: This document  contains information and/or instructional materials adapted from [facility] for the typical patient with your condition. It does not replace medical advice from your health care provider because your experience may differ from that of the typical patient. Talk to your health care provider if you have any questions about this document, your condition or your treatment plan. Adapted from [facility]

## 2023-04-20 NOTE — Plan of Care (Signed)

## 2023-04-20 NOTE — Op Note (Signed)
 04/20/2023  12:55 PM  PATIENT:  Holly Mendez  48 y.o. female  PRE-OPERATIVE DIAGNOSIS:  CHOLELITHIASIS  POST-OPERATIVE DIAGNOSIS:  CHOLELITHIASIS  PROCEDURE:  Procedure(s) with comments: LAPAROSCOPIC CHOLECYSTECTOMY (N/A) - LAP CHOLE WITH ICG DYE  SURGEON:  Surgeons and Role:    [name], MD - Primary  ASSISTANTS: none   ANESTHESIA:   local and general  EBL:  minimal   BLOOD ADMINISTERED:none  DRAINS: none   LOCAL MEDICATIONS USED:  BUPIVICAINE   SPECIMEN:  Source of Specimen:  gallbladder  DISPOSITION OF SPECIMEN:  PATHOLOGY  COUNTS:  YES  TOURNIQUET:  * No tourniquets in log *  DICTATION: .Dragon Dictation  The patient was taken to the operating and placed in the supine position with bilateral SCDs in place.  The patient was prepped and draped in the usual sterile fashion. A time out was called and all facts were verified. A pneumoperitoneum was obtained via A Veress needle technique to a pressure of 14mm of mercury.  A 5mm trochar was then placed in the right upper quadrant under visualization, and there were no injuries to any abdominal organs. A 11 mm port was then placed in the umbilical region after infiltrating with local anesthesia under direct visualization. A second and third epigastric port and right lower quadrant port placement under direct visualization, respectively.    The gallbladder was identified and retracted, the peritoneum was then sharply dissected from the gallbladder and this dissection was carried down to Calot's triangle. The cystic duct was identified and stripped away circumferentially and seen going into the gallbladder 360, the critical angle was obtained.  2 clips were placed proximally one distally and the cystic duct transected. The cystic artery was identified and 2 clips placed proximally and one distally and transected.  We then proceeded to remove the gallbladder off the hepatic fossa with Bovie cautery. A retrieval bag  was then placed in the abdomen and gallbladder placed in the bag. The hepatic fossa was then reexamined and hemostasis was achieved with Bovie cautery and was excellent at the end of the case.   The subhepatic fossa and perihepatic fossa was then irrigated until the effluent was clear.  The gallbladder and bag were removed from the abdominal cavity. The 11 mm trocar fascia was reapproximated with the Endo Close #1 Vicryl x3.  The pneumoperitoneum was evacuated and all trochars removed under direct visulalization.  The skin was then closed with 4-0 Monocryl and the skin dressed with Dermabond.    The patient was awaken from general anesthesia and taken to the recovery room in stable condition.  PLAN OF CARE: Admit for overnight observation  PATIENT DISPOSITION:  PACU - hemodynamically stable.   Delay start of Pharmacological VTE agent (>24hrs) due to surgical blood loss or risk of bleeding: not applicable

## 2023-04-21 ENCOUNTER — Encounter (HOSPITAL_COMMUNITY): Payer: Self-pay | Admitting: General Surgery

## 2023-04-21 MED ORDER — OXYCODONE HCL 5 MG PO TABS
5.0000 mg | ORAL_TABLET | Freq: Four times a day (QID) | ORAL | 0 refills | Status: DC | PRN
Start: 2023-04-21 — End: 2023-04-29

## 2023-04-21 MED ORDER — ONDANSETRON HCL 4 MG PO TABS: 4.0000 mg | ORAL_TABLET | Freq: Three times a day (TID) | ORAL | 1 refills | Status: DC | PRN

## 2023-04-21 MED ORDER — OXYCODONE HCL 5 MG PO TABS: 5.0000 mg | ORAL_TABLET | Freq: Four times a day (QID) | ORAL | 0 refills | Status: DC | PRN

## 2023-04-21 MED ORDER — ONDANSETRON HCL 4 MG PO TABS: 4.0000 mg | ORAL_TABLET | Freq: Every day | ORAL | 1 refills | Status: DC | PRN

## 2023-04-21 NOTE — TOC Progression Note (Signed)
 Transition of Care [facility]) - Progression Note    Patient Details  Name: Holly Mendez MRN: [id number] Date of Birth: July 15, 1975  [facility] CM/SW Contact  Katrine Parody, LCSW Phone Number: 04/21/2023, 9:38 AM  Clinical Narrative:    CSW attempted to complete SDOH and provide resources. Pt rushed to leave and had just exited as family member transporting had to head to work.  No further TOC needs at this time.        Expected Discharge Plan and Services         Expected Discharge Date: 04/21/23                                     Social Determinants of Health (SDOH) Interventions SDOH Screenings   Food Insecurity: No Food Insecurity (04/19/2023)  Recent Concern: Food Insecurity - Food Insecurity Present (03/23/2023)  Housing: Low Risk  (04/19/2023)  Transportation Needs: Unmet Transportation Needs (04/19/2023)  Utilities: Not At Risk (04/19/2023)  Depression (PHQ2-9): High Risk (01/24/2023)  Social Connections: Socially Isolated (04/19/2023)  Tobacco Use: Low Risk  (04/20/2023)  Recent Concern: Tobacco Use - Medium Risk (02/12/2023)    Readmission Risk Interventions     No data to display

## 2023-04-21 NOTE — Plan of Care (Signed)

## 2023-04-21 NOTE — Progress Notes (Signed)
 1 Day Post-Op   Subjective/Chief Complaint: No complaints other than soreness   Objective: Vital signs in last 24 hours: Temp:  [96.8 F (36 C)-98.9 F (37.2 C)] 98.9 F (37.2 C) (04/12 0232) Pulse Rate:  [81-97] 95 (04/12 0232) Resp:  [11-32] 18 (04/12 0232) BP: (153-185)/(90-107) 154/92 (04/12 0232) SpO2:  [94 %-100 %] 97 % (04/12 0232)    Intake/Output from previous day: 04/11 0701 - 04/12 0700 In: 2053.7 [I.V.:1953.7; IV Piggyback:100] Out: -  Intake/Output this shift: No intake/output data recorded.  General appearance: alert and cooperative Resp: clear to auscultation bilaterally Cardio: regular rate and rhythm GI: soft, mild tenderness  Lab Results:  Recent Labs    04/19/23 1553  WBC 7.5  HGB 12.3  HCT 38.5  PLT 352   BMET Recent Labs    04/19/23 1553  NA 139  K 3.8  CL 104  CO2 22  GLUCOSE 162*  BUN 9  CREATININE 0.54  CALCIUM 10.0   PT/INR No results for input(s): "LABPROT", "INR" in the last 72 hours. ABG No results for input(s): "PHART", "HCO3" in the last 72 hours.  Invalid input(s): "PCO2", "PO2"  Studies/Results: US  Abdomen Limited RUQ (LIVER/GB) Result Date: 04/19/2023 CLINICAL DATA:  [id number] RUQ abdominal pain [id number] EXAM: ULTRASOUND ABDOMEN LIMITED RIGHT UPPER QUADRANT COMPARISON:  March 26, 2023 FINDINGS: Gallbladder: Multiple gallstones. No wall thickening or pericholecystic fluid. Positive sonographic Abigail Abler sign reported by the sonographer. Common bile duct: Diameter: 3 mm Liver: No focal lesion identified. Within normal limits in parenchymal echogenicity. Portal vein is patent on color Doppler imaging with normal direction of blood flow towards the liver. Other: None. IMPRESSION: Multiple gallstones. Positive sonographic Abigail Abler sign reported by the sonographer. In the absence of wall thickening and pericholecystic fluid, the study is equivocal for acute cholecystitis. If this remains of concern, a hepatobiliary scan could be  considered. Electronically Signed   By: Rance Burrows M.D.   On: 04/19/2023 18:27    Anti-infectives: Anti-infectives (From admission, onward)    Start     Dose/Rate Route Frequency Ordered Stop   04/20/23 2000  cefTRIAXone (ROCEPHIN) 2 g in sodium chloride 0.9 % 100 mL IVPB  Status:  Discontinued        2 g 200 mL/hr over 30 Minutes Intravenous Every 24 hours 04/19/23 2020 04/20/23 1439   04/19/23 1900  cefTRIAXone (ROCEPHIN) 2 g in sodium chloride 0.9 % 100 mL IVPB        2 g 200 mL/hr over 30 Minutes Intravenous  Once 04/19/23 1850 04/19/23 2011       Assessment/Plan: s/p Procedure(s) with comments: LAPAROSCOPIC CHOLECYSTECTOMY (N/A) - LAP CHOLE WITH ICG DYE Advance diet Discharge  LOS: 0 days    Holly Mendez III 04/21/2023

## 2023-04-23 LAB — SURGICAL PATHOLOGY

## 2023-04-24 ENCOUNTER — Encounter (HOSPITAL_COMMUNITY): Payer: MEDICAID | Admitting: Physical Therapy

## 2023-04-24 ENCOUNTER — Telehealth (HOSPITAL_COMMUNITY): Payer: Self-pay | Admitting: Physical Therapy

## 2023-04-24 NOTE — Telephone Encounter (Signed)
 Pt with 3 NS and will now be discharged.  Pt and Evaluating therapist made aware.    Lorenso Romance, PTA/CLT [facility] Ph: [phone]

## 2023-04-26 ENCOUNTER — Encounter (HOSPITAL_COMMUNITY): Payer: MEDICAID | Admitting: Physical Therapy

## 2023-04-28 ENCOUNTER — Inpatient Hospital Stay (HOSPITAL_COMMUNITY)
Admission: EM | Admit: 2023-04-28 | Discharge: 2023-04-30 | DRG: 393 | Disposition: A | Discharge status: Home / Self Care | Payer: MEDICAID | Attending: Internal Medicine | Admitting: Internal Medicine

## 2023-04-28 ENCOUNTER — Other Ambulatory Visit: Payer: Self-pay

## 2023-04-28 ENCOUNTER — Emergency Department (HOSPITAL_COMMUNITY): Payer: MEDICAID

## 2023-04-28 ENCOUNTER — Encounter (HOSPITAL_COMMUNITY): Payer: Self-pay

## 2023-04-28 DIAGNOSIS — Z96651 Presence of right artificial knee joint: Secondary | ICD-10-CM | POA: Diagnosis present

## 2023-04-28 DIAGNOSIS — K9189 Other postprocedural complications and disorders of digestive system: Principal | ICD-10-CM | POA: Diagnosis present

## 2023-04-28 DIAGNOSIS — M797 Fibromyalgia: Secondary | ICD-10-CM | POA: Diagnosis present

## 2023-04-28 DIAGNOSIS — Z886 Allergy status to analgesic agent status: Secondary | ICD-10-CM

## 2023-04-28 DIAGNOSIS — R112 Nausea with vomiting, unspecified: Secondary | ICD-10-CM | POA: Diagnosis present

## 2023-04-28 DIAGNOSIS — Z888 Allergy status to other drugs, medicaments and biological substances status: Secondary | ICD-10-CM

## 2023-04-28 DIAGNOSIS — F419 Anxiety disorder, unspecified: Secondary | ICD-10-CM | POA: Diagnosis present

## 2023-04-28 DIAGNOSIS — G8929 Other chronic pain: Secondary | ICD-10-CM | POA: Diagnosis present

## 2023-04-28 DIAGNOSIS — E7800 Pure hypercholesterolemia, unspecified: Secondary | ICD-10-CM | POA: Diagnosis present

## 2023-04-28 DIAGNOSIS — F259 Schizoaffective disorder, unspecified: Secondary | ICD-10-CM | POA: Diagnosis present

## 2023-04-28 DIAGNOSIS — K861 Other chronic pancreatitis: Secondary | ICD-10-CM | POA: Diagnosis present

## 2023-04-28 DIAGNOSIS — E66812 Obesity, class 2: Secondary | ICD-10-CM | POA: Diagnosis present

## 2023-04-28 DIAGNOSIS — E559 Vitamin D deficiency, unspecified: Secondary | ICD-10-CM | POA: Diagnosis present

## 2023-04-28 DIAGNOSIS — Z6836 Body mass index (BMI) 36.0-36.9, adult: Secondary | ICD-10-CM

## 2023-04-28 DIAGNOSIS — F319 Bipolar disorder, unspecified: Secondary | ICD-10-CM | POA: Diagnosis present

## 2023-04-28 DIAGNOSIS — Y838 Other surgical procedures as the cause of abnormal reaction of the patient, or of later complication, without mention of misadventure at the time of the procedure: Secondary | ICD-10-CM | POA: Diagnosis present

## 2023-04-28 DIAGNOSIS — Z90710 Acquired absence of both cervix and uterus: Secondary | ICD-10-CM

## 2023-04-28 DIAGNOSIS — J45909 Unspecified asthma, uncomplicated: Secondary | ICD-10-CM | POA: Diagnosis present

## 2023-04-28 DIAGNOSIS — Z8661 Personal history of infections of the central nervous system: Secondary | ICD-10-CM

## 2023-04-28 DIAGNOSIS — K219 Gastro-esophageal reflux disease without esophagitis: Secondary | ICD-10-CM | POA: Diagnosis present

## 2023-04-28 DIAGNOSIS — Z79899 Other long term (current) drug therapy: Secondary | ICD-10-CM

## 2023-04-28 DIAGNOSIS — M545 Low back pain, unspecified: Secondary | ICD-10-CM | POA: Diagnosis present

## 2023-04-28 DIAGNOSIS — Z881 Allergy status to other antibiotic agents status: Secondary | ICD-10-CM

## 2023-04-28 DIAGNOSIS — Z7982 Long term (current) use of aspirin: Secondary | ICD-10-CM

## 2023-04-28 DIAGNOSIS — E876 Hypokalemia: Secondary | ICD-10-CM | POA: Diagnosis present

## 2023-04-28 DIAGNOSIS — K859 Acute pancreatitis without necrosis or infection, unspecified: Principal | ICD-10-CM | POA: Diagnosis present

## 2023-04-28 DIAGNOSIS — D649 Anemia, unspecified: Secondary | ICD-10-CM | POA: Diagnosis present

## 2023-04-28 DIAGNOSIS — I10 Essential (primary) hypertension: Secondary | ICD-10-CM | POA: Diagnosis present

## 2023-04-28 DIAGNOSIS — R109 Unspecified abdominal pain: Secondary | ICD-10-CM | POA: Diagnosis present

## 2023-04-28 DIAGNOSIS — K5900 Constipation, unspecified: Secondary | ICD-10-CM | POA: Diagnosis present

## 2023-04-28 DIAGNOSIS — Z7951 Long term (current) use of inhaled steroids: Secondary | ICD-10-CM

## 2023-04-28 DIAGNOSIS — Z9049 Acquired absence of other specified parts of digestive tract: Secondary | ICD-10-CM

## 2023-04-28 DIAGNOSIS — Z970 Presence of artificial eye: Secondary | ICD-10-CM

## 2023-04-28 LAB — COMPREHENSIVE METABOLIC PANEL WITH GFR
ALT: 15 U/L (ref 0–44)
AST: 23 U/L (ref 15–41)
Albumin: 4 g/dL (ref 3.5–5.0)
Alkaline Phosphatase: 72 U/L (ref 38–126)
Anion gap: 12 (ref 5–15)
BUN: 11 mg/dL (ref 6–20)
CO2: 24 mmol/L (ref 22–32)
Calcium: 9.4 mg/dL (ref 8.9–10.3)
Chloride: 102 mmol/L (ref 98–111)
Creatinine, Ser: 0.8 mg/dL (ref 0.44–1.00)
GFR, Estimated: >60 mL/min (ref >60)
Glucose, Bld: 173 mg/dL — ABNORMAL HIGH (ref 70–99)
Potassium: 3.4 mmol/L — ABNORMAL LOW (ref 3.5–5.1)
Sodium: 138 mmol/L (ref 135–145)
Total Bilirubin: 0.5 mg/dL (ref 0.0–1.2)
Total Protein: 7.7 g/dL (ref 6.5–8.1)

## 2023-04-28 LAB — CBC
HCT: 34.4 % — ABNORMAL LOW (ref 36.0–46.0)
Hemoglobin: 11.5 g/dL — ABNORMAL LOW (ref 12.0–15.0)
MCH: 28.9 pg (ref 26.0–34.0)
MCHC: 33.4 g/dL (ref 30.0–36.0)
MCV: 86.4 fL (ref 80.0–100.0)
Platelets: 341 10*3/uL (ref 150–400)
RBC: 3.98 MIL/uL (ref 3.87–5.11)
RDW: 14.9 % (ref 11.5–15.5)
WBC: 8.2 10*3/uL (ref 4.0–10.5)
nRBC: 0 % (ref 0.0–0.2)

## 2023-04-28 LAB — LIPASE, BLOOD: Lipase: 637 U/L — ABNORMAL HIGH (ref 11–51)

## 2023-04-28 MED ORDER — LORAZEPAM 2 MG/ML IJ SOLN
1.0000 mg | Freq: Once | INTRAMUSCULAR | Status: AC
  Administered 2023-04-28: 1 mg via INTRAVENOUS
  Filled 2023-04-28: qty 1

## 2023-04-28 MED ORDER — SODIUM CHLORIDE 0.9 % IV SOLN: INTRAVENOUS | Status: DC

## 2023-04-28 MED ORDER — LORAZEPAM 1 MG PO TABS: 1.0000 mg | ORAL_TABLET | Freq: Once | ORAL | Status: DC

## 2023-04-28 MED ORDER — ONDANSETRON HCL 4 MG/2ML IJ SOLN
4.0000 mg | Freq: Once | INTRAMUSCULAR | Status: AC
  Administered 2023-04-28: 4 mg via INTRAVENOUS
  Filled 2023-04-28 (×2): qty 2

## 2023-04-28 MED ORDER — IOHEXOL 300 MG/ML  SOLN
100.0000 mL | Freq: Once | INTRAMUSCULAR | Status: AC | PRN
  Administered 2023-04-28: 100 mL via INTRAVENOUS

## 2023-04-28 MED ORDER — MORPHINE SULFATE (PF) 4 MG/ML IV SOLN
4.0000 mg | Freq: Once | INTRAVENOUS | Status: AC
  Administered 2023-04-28: 4 mg via INTRAVENOUS
  Filled 2023-04-28 (×2): qty 1

## 2023-04-28 NOTE — ED Provider Notes (Signed)
 This patient is a 48 year old female, she is not a stranger to the medical field as she has had multiple visits to the hospital over the last few months because of a total knee replacement in February, abdominal pain that started in March, she had an EGD and eventually a cholecystectomy that was in April 2025.  She now presents approximately 8 days later with increasing upper abdominal pain.  Reviewing the prior medical record the patient has had prior pancreatitis, she presents again with pancreatitis with an lipase of over 600.  Abdominal exam is otherwise significant only for epigastric tenderness but no peritoneal signs.  She is mildly tachycardic to about 105 to 110 bpm she is afebrile and she is normotensive.  CT scan ordered but has not yet been read, pain medicines and IV fluids been given, the patient is feeling a little bit better.  Dissipate need for admission given recent surgery acute pancreatitis and pain with tachycardia   Early Glisson, MD 04/28/23 2300

## 2023-04-28 NOTE — ED Notes (Addendum)
 Heard patient retching very loudly and when I went to the room she was sticking her hand down her throat and vomiting green bile on the floor.

## 2023-04-28 NOTE — ED Triage Notes (Signed)
 Pt reports upper abd pain the same as she had before her gall bladder removal a week or 2 ago.

## 2023-04-28 NOTE — ED Provider Notes (Signed)
 [place] EMERGENCY DEPARTMENT AT [facility] Provider Note   CSN: [id number] Arrival date & time: 04/28/23  2103     History  Chief Complaint  Patient presents with   Abdominal Pain    Holly Mendez is a 48 y.o. female.  48 year old female presenting with abdominal pain. Patient is s/p cholecystectomy April 11.  Patient woke up from a nap today in excruciating abdominal pain, "like the pain I had before my gallbladder was taken out".  She proceeded to vomit 3 times, describing her vomit as green in appearance.  She denies diarrhea, hematemesis, hematochezia/melena.  She is having trouble answering questions secondary to pain.   Abdominal Pain      Home Medications Prior to Admission medications   Medication Sig Start Date End Date Taking? Authorizing Provider  aspirin  81 MG chewable tablet Chew 1 tablet (81 mg total) by mouth 2 (two) times daily. 03/08/23   Bronson Canny, PA-C  cetirizine  (ZYRTEC ) 10 MG tablet Take 10 mg by mouth in the morning.    [provider]  cloNIDine  (CATAPRES  - DOSED IN MG/24 HR) 0.1 mg/24hr patch PLACE 1 PATCH ONTO SKIN ONCE WEEKLY. Patient taking differently: Place 0.1 mg onto the skin every Sunday. PLACE 1 PATCH ONTO SKIN ONCE WEEKLY. 06/07/22   Charise Companion, MD  divalproex  (DEPAKOTE ) 500 MG DR tablet Take 500 mg by mouth 2 (two) times daily. 10/05/22   [provider]  esomeprazole  (NEXIUM ) 40 MG capsule Take 1 capsule (40 mg total) by mouth 2 (two) times daily before a meal. 03/27/23   Sedalia Dacosta, MD  fluconazole  (DIFLUCAN ) 200 MG tablet TAKE 1 TABLET BY MOUTH EVERY DAY UNTIL GONE 04/12/23   Charise Companion, MD  gabapentin  (NEURONTIN ) 300 MG capsule Take 600 mg by mouth at bedtime.    [provider]  HYDROcodone -acetaminophen  (NORCO/VICODIN) 5-325 MG tablet TAKE TWO TABLETS BY MOUTH EVERY MORNING, 1 TAB MIDDAY AND 1 TAB AT NIGHT 04/16/23   Charise Companion, MD  NARCAN  4 MG/0.1ML LIQD nasal spray kit Place 1 spray  into the nose daily as needed (overdose). 02/15/22   Charise Companion, MD  ondansetron  (ZOFRAN ) 4 MG tablet Take 1 tablet (4 mg total) by mouth every 8 (eight) hours as needed for nausea or vomiting. 04/21/23 04/20/24  Lillette Reid III, MD  ondansetron  (ZOFRAN ) 4 MG tablet Take 1 tablet (4 mg total) by mouth daily as needed for nausea or vomiting. 04/21/23 04/20/24  Lillette Reid III, MD  oxyCODONE  (OXY IR/ROXICODONE ) 5 MG immediate release tablet Take 1-2 tablets (5-10 mg total) by mouth every 6 (six) hours as needed for moderate pain (pain score 4-6). 04/21/23   Caralyn Chandler, MD  oxyCODONE  (ROXICODONE ) 5 MG immediate release tablet Take 1 tablet (5 mg total) by mouth every 6 (six) hours as needed for severe pain (pain score 7-10). 04/21/23   Lillette Reid III, MD  polyethylene glycol powder (GAVILAX) 17 GM/SCOOP powder Take one or two capfuls daily by mouth as directed for constipation Patient taking differently: Take 17 g by mouth daily as needed for moderate constipation. 10/25/21   Charise Companion, MD  QUEtiapine  (SEROQUEL ) 400 MG tablet Take 400 mg by mouth at bedtime.    [provider]  sucralfate  (CARAFATE ) 1 GM/10ML suspension Take 10 mLs (1 g total) by mouth every 6 (six) hours. 03/27/23   Rizwan, Saima, MD  SYMBICORT  80-4.5 MCG/ACT inhaler INHALE 2 PUFFS INTO THE LUNGS TWICE DAILY AS  NEEDED. 12/27/22   Charise Companion, MD  tiZANidine  (ZANAFLEX ) 4 MG tablet Take 1 tablet (4 mg total) by mouth every 6 (six) hours as needed for muscle spasms. 03/08/23   Bronson Canny, PA-C  topiramate  (TOPAMAX ) 100 MG tablet Take 1 tablet (100 mg total) by mouth 2 (two) times daily. By psychiatry Patient taking differently: Take 100 mg by mouth at bedtime. By psychiatry 01/25/23   Charise Companion, MD  VENTOLIN  HFA [phone] Base) MCG/ACT inhaler INHALE TWO PUFFS INTO THE LUNGS EVERY 6 HOURS AS NEEDED WHEEZING OR SHORTNESS OF BREATH 12/27/22   Charise Companion, MD      Allergies    Lyrica  Agustina Hosteller ], Celebrex  [celecoxib ],  Gabapentin , Naproxen , Norvasc  [amlodipine  besylate], Zithromax [azithromycin], and Nabumetone     Review of Systems   Review of Systems  Gastrointestinal:  Positive for abdominal pain.    Physical Exam Updated Vital Signs  Vitals:   04/28/23 2110 04/28/23 2111 04/28/23 2200 04/28/23 2300  BP: (!) 137/106  (!) 143/88   Pulse: 100  (!) 109 (!) 102  Resp: 20  (!) 34 (!) 24  Temp: 98.4 F (36.9 C)     TempSrc: Oral     SpO2: 100%  100% 94%  Weight:  99 kg    Height:  5\' 5"  (1.651 m)      Physical Exam Vitals and nursing note reviewed.  Constitutional:      General: She is in acute distress.  HENT:     Head: Normocephalic and atraumatic.  Cardiovascular:     Rate and Rhythm: Tachycardia present.  Pulmonary:     Comments: Tachypnea  Abdominal:     Palpations: Abdomen is soft.     Tenderness: There is abdominal tenderness in the epigastric area. There is guarding.     Comments: 3 laparoscopic port scars, no erythema or warmth  Skin:    General: Skin is warm and dry.     ED Results / Procedures / Treatments   Labs (all labs ordered are listed, but only abnormal results are displayed) Labs Reviewed  LIPASE, BLOOD - Abnormal; Notable for the following components:      Result Value   Lipase 637 (*)    All other components within normal limits  COMPREHENSIVE METABOLIC PANEL WITH GFR - Abnormal; Notable for the following components:   Potassium 3.4 (*)    Glucose, Bld 173 (*)    All other components within normal limits  CBC - Abnormal; Notable for the following components:   Hemoglobin 11.5 (*)    HCT 34.4 (*)    All other components within normal limits  URINALYSIS, ROUTINE W REFLEX MICROSCOPIC    EKG None  Radiology No results found.  Procedures Procedures    Medications Ordered in ED Medications  0.9 %  sodium chloride  infusion (has no administration in time range)  LORazepam  (ATIVAN ) injection 1 mg (1 mg Intravenous Given 04/28/23 2205)  morphine  (PF) 4  MG/ML injection 4 mg (4 mg Intravenous Given 04/28/23 2318)  ondansetron  (ZOFRAN ) injection 4 mg (4 mg Intravenous Given 04/28/23 2317)  iohexol  (OMNIPAQUE ) 300 MG/ML solution 100 mL (100 mLs Intravenous Contrast Given 04/28/23 2233)    ED Course/ Medical Decision Making/ A&P                                 Medical Decision Making 48 year old female presenting with abdominal pain.  Diagnosis includes postsurgical  perforation, postsurgical bleed, postsurgical infection, pancreatitis, gastritis, peptic ulcer disease.   Co morbidities that complicate the patient evaluation  Recent cholecystectomy, diagnosed with chronic pancreatitis in 2021.   Additional history obtained:  External records from outside source obtained and reviewed including prior notes, operative report, discharge summaries from prior admissions.   Lab Tests:  I Ordered, and personally interpreted labs.  The pertinent results include: Lipase of 637.   Imaging Studies ordered:  I ordered imaging studies including CT abdomen pelvis with contrast CT pending.  Cardiac Monitoring: / EKG:  EKG pending.   Problem List / ED Course / Critical interventions / Medication management  I ordered medication including Ativan  for anxiety and agitation Reevaluation of the patient after these medicines showed that the patient improved I have reviewed the patients home medicines and have made adjustments as needed   Test / Admission - Considered:  Patient presented extremely agitated secondary to pain, Ativan  administered with good response.  Lipase of 637, history of chronic pancreatitis noted in 2021.  Started on normal saline infusion of 250 mL/h.  Patient is NPO.  CT images reviewed, radiologic interpretation pending.  Discussed case with Dr. Lula Sale who will assume patient's care.    Amount and/or Complexity of Data Reviewed External Data Reviewed: notes. Labs: ordered. Radiology: ordered.  Risk Prescription drug  management. Decision regarding hospitalization.           Final Clinical Impression(s) / ED Diagnoses Final diagnoses:  Acute pancreatitis, unspecified complication status, unspecified pancreatitis type    Rx / DC Orders ED Discharge Orders     None         Adolm Ahumada 04/28/23 2338    Early Glisson, MD 04/29/23 1158

## 2023-04-29 DIAGNOSIS — Z6836 Body mass index (BMI) 36.0-36.9, adult: Secondary | ICD-10-CM | POA: Diagnosis not present

## 2023-04-29 DIAGNOSIS — E559 Vitamin D deficiency, unspecified: Secondary | ICD-10-CM | POA: Diagnosis present

## 2023-04-29 DIAGNOSIS — M797 Fibromyalgia: Secondary | ICD-10-CM | POA: Diagnosis present

## 2023-04-29 DIAGNOSIS — F319 Bipolar disorder, unspecified: Secondary | ICD-10-CM | POA: Diagnosis present

## 2023-04-29 DIAGNOSIS — E7800 Pure hypercholesterolemia, unspecified: Secondary | ICD-10-CM | POA: Diagnosis present

## 2023-04-29 DIAGNOSIS — R1013 Epigastric pain: Secondary | ICD-10-CM | POA: Diagnosis not present

## 2023-04-29 DIAGNOSIS — R112 Nausea with vomiting, unspecified: Secondary | ICD-10-CM

## 2023-04-29 DIAGNOSIS — K859 Acute pancreatitis without necrosis or infection, unspecified: Secondary | ICD-10-CM

## 2023-04-29 DIAGNOSIS — K5900 Constipation, unspecified: Secondary | ICD-10-CM | POA: Diagnosis present

## 2023-04-29 DIAGNOSIS — Z7982 Long term (current) use of aspirin: Secondary | ICD-10-CM | POA: Diagnosis not present

## 2023-04-29 DIAGNOSIS — K9189 Other postprocedural complications and disorders of digestive system: Secondary | ICD-10-CM | POA: Diagnosis present

## 2023-04-29 DIAGNOSIS — K861 Other chronic pancreatitis: Secondary | ICD-10-CM | POA: Diagnosis present

## 2023-04-29 DIAGNOSIS — I10 Essential (primary) hypertension: Secondary | ICD-10-CM | POA: Diagnosis present

## 2023-04-29 DIAGNOSIS — Y838 Other surgical procedures as the cause of abnormal reaction of the patient, or of later complication, without mention of misadventure at the time of the procedure: Secondary | ICD-10-CM | POA: Diagnosis present

## 2023-04-29 DIAGNOSIS — E66812 Obesity, class 2: Secondary | ICD-10-CM | POA: Diagnosis present

## 2023-04-29 DIAGNOSIS — Z7951 Long term (current) use of inhaled steroids: Secondary | ICD-10-CM | POA: Diagnosis not present

## 2023-04-29 DIAGNOSIS — F419 Anxiety disorder, unspecified: Secondary | ICD-10-CM | POA: Diagnosis present

## 2023-04-29 DIAGNOSIS — Z79899 Other long term (current) drug therapy: Secondary | ICD-10-CM | POA: Diagnosis not present

## 2023-04-29 DIAGNOSIS — D649 Anemia, unspecified: Secondary | ICD-10-CM | POA: Diagnosis present

## 2023-04-29 DIAGNOSIS — E876 Hypokalemia: Secondary | ICD-10-CM

## 2023-04-29 DIAGNOSIS — J45909 Unspecified asthma, uncomplicated: Secondary | ICD-10-CM | POA: Diagnosis present

## 2023-04-29 DIAGNOSIS — Z8661 Personal history of infections of the central nervous system: Secondary | ICD-10-CM | POA: Diagnosis not present

## 2023-04-29 DIAGNOSIS — M545 Low back pain, unspecified: Secondary | ICD-10-CM | POA: Diagnosis present

## 2023-04-29 DIAGNOSIS — F259 Schizoaffective disorder, unspecified: Secondary | ICD-10-CM | POA: Diagnosis present

## 2023-04-29 DIAGNOSIS — K219 Gastro-esophageal reflux disease without esophagitis: Secondary | ICD-10-CM

## 2023-04-29 DIAGNOSIS — G8929 Other chronic pain: Secondary | ICD-10-CM | POA: Diagnosis present

## 2023-04-29 DIAGNOSIS — Z96651 Presence of right artificial knee joint: Secondary | ICD-10-CM | POA: Diagnosis present

## 2023-04-29 LAB — COMPREHENSIVE METABOLIC PANEL WITH GFR
ALT: 13 U/L (ref 0–44)
AST: 16 U/L (ref 15–41)
Albumin: 3.7 g/dL (ref 3.5–5.0)
Alkaline Phosphatase: 72 U/L (ref 38–126)
Anion gap: 9 (ref 5–15)
BUN: 8 mg/dL (ref 6–20)
CO2: 27 mmol/L (ref 22–32)
Calcium: 9 mg/dL (ref 8.9–10.3)
Chloride: 101 mmol/L (ref 98–111)
Creatinine, Ser: 0.52 mg/dL (ref 0.44–1.00)
GFR, Estimated: >60 mL/min (ref >60)
Glucose, Bld: 156 mg/dL — ABNORMAL HIGH (ref 70–99)
Potassium: 3.7 mmol/L (ref 3.5–5.1)
Sodium: 137 mmol/L (ref 135–145)
Total Bilirubin: 0.4 mg/dL (ref 0.0–1.2)
Total Protein: 7.3 g/dL (ref 6.5–8.1)

## 2023-04-29 LAB — CBC
HCT: 34.7 % — ABNORMAL LOW (ref 36.0–46.0)
Hemoglobin: 11 g/dL — ABNORMAL LOW (ref 12.0–15.0)
MCH: 27.7 pg (ref 26.0–34.0)
MCHC: 31.7 g/dL (ref 30.0–36.0)
MCV: 87.4 fL (ref 80.0–100.0)
Platelets: 298 10*3/uL (ref 150–400)
RBC: 3.97 MIL/uL (ref 3.87–5.11)
RDW: 15 % (ref 11.5–15.5)
WBC: 6.6 10*3/uL (ref 4.0–10.5)
nRBC: 0 % (ref 0.0–0.2)

## 2023-04-29 LAB — URINALYSIS, ROUTINE W REFLEX MICROSCOPIC
Bilirubin Urine: NEGATIVE
Glucose, UA: 50 mg/dL — AB
Hgb urine dipstick: NEGATIVE
Ketones, ur: NEGATIVE mg/dL
Leukocytes,Ua: NEGATIVE
Nitrite: NEGATIVE
Protein, ur: NEGATIVE mg/dL
Specific Gravity, Urine: 1.041 — ABNORMAL HIGH (ref 1.005–1.030)
pH: 7 (ref 5.0–8.0)

## 2023-04-29 LAB — LIPID PANEL
Cholesterol: 167 mg/dL (ref 0–200)
HDL: 46 mg/dL (ref >40)
LDL Cholesterol: 106 mg/dL — ABNORMAL HIGH (ref 0–99)
Total CHOL/HDL Ratio: 3.6 ratio
Triglycerides: 76 mg/dL (ref <150)
VLDL: 15 mg/dL (ref 0–40)

## 2023-04-29 LAB — PHOSPHORUS: Phosphorus: 2.9 mg/dL (ref 2.5–4.6)

## 2023-04-29 LAB — MAGNESIUM: Magnesium: 2 mg/dL (ref 1.7–2.4)

## 2023-04-29 MED ORDER — PROMETHAZINE HCL 25 MG/ML IJ SOLN
INTRAMUSCULAR | Status: AC
  Filled 2023-04-29: qty 1

## 2023-04-29 MED ORDER — ACETAMINOPHEN 650 MG RE SUPP: 650.0000 mg | Freq: Four times a day (QID) | RECTAL | Status: DC | PRN

## 2023-04-29 MED ORDER — ORAL CARE MOUTH RINSE: 15.0000 mL | OROMUCOSAL | Status: DC | PRN

## 2023-04-29 MED ORDER — LACTATED RINGERS IV SOLN: INTRAVENOUS | Status: DC

## 2023-04-29 MED ORDER — TOPIRAMATE 100 MG PO TABS
100.0000 mg | ORAL_TABLET | Freq: Every day | ORAL | Status: DC
  Administered 2023-04-29: 100 mg via ORAL
  Filled 2023-04-29: qty 1

## 2023-04-29 MED ORDER — MORPHINE SULFATE (PF) 2 MG/ML IV SOLN
2.0000 mg | INTRAVENOUS | Status: DC | PRN
  Administered 2023-04-29 (×2): 2 mg via INTRAVENOUS
  Filled 2023-04-29 (×2): qty 1

## 2023-04-29 MED ORDER — HYDROMORPHONE HCL 1 MG/ML IJ SOLN
0.5000 mg | INTRAMUSCULAR | Status: DC | PRN
  Administered 2023-04-29 – 2023-04-30 (×7): 0.5 mg via INTRAVENOUS
  Filled 2023-04-29 (×9): qty 0.5

## 2023-04-29 MED ORDER — LACTATED RINGERS IV SOLN: INTRAVENOUS | Status: AC

## 2023-04-29 MED ORDER — HYDROXYZINE HCL 25 MG PO TABS
25.0000 mg | ORAL_TABLET | Freq: Three times a day (TID) | ORAL | Status: DC
  Administered 2023-04-29 – 2023-04-30 (×3): 25 mg via ORAL
  Filled 2023-04-29 (×3): qty 1

## 2023-04-29 MED ORDER — QUETIAPINE FUMARATE 100 MG PO TABS
400.0000 mg | ORAL_TABLET | Freq: Every day | ORAL | Status: DC
Start: 2023-04-29 — End: 2023-04-30
  Administered 2023-04-29: 400 mg via ORAL
  Filled 2023-04-29: qty 4

## 2023-04-29 MED ORDER — POTASSIUM CHLORIDE 10 MEQ/100ML IV SOLN
10.0000 meq | INTRAVENOUS | Status: AC
  Administered 2023-04-29 (×2): 10 meq via INTRAVENOUS
  Filled 2023-04-29 (×2): qty 100

## 2023-04-29 MED ORDER — MORPHINE SULFATE (PF) 4 MG/ML IV SOLN
4.0000 mg | Freq: Once | INTRAVENOUS | Status: AC
  Administered 2023-04-29: 4 mg via INTRAVENOUS
  Filled 2023-04-29: qty 1

## 2023-04-29 MED ORDER — FLUCONAZOLE 100 MG PO TABS
100.0000 mg | ORAL_TABLET | Freq: Every day | ORAL | Status: DC
  Administered 2023-04-29 – 2023-04-30 (×2): 100 mg via ORAL
  Filled 2023-04-29 (×2): qty 1

## 2023-04-29 MED ORDER — ACETAMINOPHEN 325 MG PO TABS: 650.0000 mg | ORAL_TABLET | Freq: Four times a day (QID) | ORAL | Status: DC | PRN

## 2023-04-29 MED ORDER — HYDRALAZINE HCL 20 MG/ML IJ SOLN
10.0000 mg | INTRAMUSCULAR | Status: DC | PRN
  Administered 2023-04-29: 10 mg via INTRAVENOUS
  Filled 2023-04-29: qty 1

## 2023-04-29 MED ORDER — DIVALPROEX SODIUM 250 MG PO DR TAB
500.0000 mg | DELAYED_RELEASE_TABLET | Freq: Two times a day (BID) | ORAL | Status: DC
  Administered 2023-04-29 – 2023-04-30 (×3): 500 mg via ORAL
  Filled 2023-04-29 (×3): qty 2

## 2023-04-29 MED ORDER — ENOXAPARIN SODIUM 40 MG/0.4ML IJ SOSY
40.0000 mg | PREFILLED_SYRINGE | INTRAMUSCULAR | Status: DC
Start: 2023-04-29 — End: 2023-04-30
  Administered 2023-04-29 – 2023-04-30 (×2): 40 mg via SUBCUTANEOUS
  Filled 2023-04-29 (×2): qty 0.4

## 2023-04-29 MED ORDER — GABAPENTIN 300 MG PO CAPS
600.0000 mg | ORAL_CAPSULE | Freq: Every day | ORAL | Status: DC
  Administered 2023-04-29: 600 mg via ORAL
  Filled 2023-04-29: qty 2

## 2023-04-29 MED ORDER — PANTOPRAZOLE SODIUM 40 MG IV SOLR
40.0000 mg | INTRAVENOUS | Status: DC
  Administered 2023-04-29 – 2023-04-30 (×2): 40 mg via INTRAVENOUS
  Filled 2023-04-29 (×2): qty 10

## 2023-04-29 MED ORDER — CLONIDINE HCL 0.1 MG/24HR TD PTWK
0.1000 mg | MEDICATED_PATCH | TRANSDERMAL | Status: DC
  Administered 2023-04-29: 0.1 mg via TRANSDERMAL
  Filled 2023-04-29: qty 1

## 2023-04-29 MED ORDER — SODIUM CHLORIDE 0.9 % IV SOLN
12.5000 mg | Freq: Four times a day (QID) | INTRAVENOUS | Status: DC | PRN
  Administered 2023-04-29: 12.5 mg via INTRAVENOUS
  Filled 2023-04-29: qty 0.5

## 2023-04-29 MED ORDER — PROCHLORPERAZINE EDISYLATE 10 MG/2ML IJ SOLN
10.0000 mg | Freq: Four times a day (QID) | INTRAMUSCULAR | Status: DC | PRN
  Administered 2023-04-29: 10 mg via INTRAVENOUS
  Filled 2023-04-29: qty 2

## 2023-04-29 NOTE — Progress Notes (Signed)
 PROGRESS NOTE    Holly Mendez  MVH:[id number] DOB: 04-10-1975 DOA: 04/28/2023 PCP: Charise Companion, MD   Brief Narrative:    Holly Mendez is a 48 y.o. female with medical history significant of hypertension, GERD, chronic pancreatitis who presents to the emergency department who presents to the emergency department due to abdominal pain which woke her up from a nap in the morning yesterday (04/19).  Patient has been admitted with acute on chronic pancreatitis in the setting of recent laparoscopic cholecystectomy.  Assessment & Plan:   Active Problems:   Essential hypertension   Hypokalemia   Abdominal pain   Acute on chronic pancreatitis (HCC)   GERD (gastroesophageal reflux disease)   Nausea and vomiting  Assessment and Plan:  Acute on chronic pancreatitis Lipase 637; abdominal pain was typical for pancreatitis BISAP Score = 2 points (tachypnea, tachycardia -2 SIRS criteria) Continue IV Phenergan  p.r.n Continue IV Dilaudid  as needed for pain Continue Protonix  Continue IV LR at 150ml/Hr Continue clear liquid diet with plan to advance diet as tolerated Patient already had cholecystectomy Check MRCP   Abdominal pain, nausea and vomiting in the setting of above Continue IV hydration Continue IV morphine  2 mg q.3h p.r.n. for moderate to severe pain Continue IV Phenergan  p.r.n. Continue clear liquid diet with plan to advanced diet as tolerated   Essential hypertension Continue clonidine  patch   GERD Continue Protonix   Obesity, class II BMI 36.58  DVT prophylaxis:Lovenox  Code Status: Full Family Communication: None at bedside Disposition Plan:  Status is: Inpatient Remains inpatient appropriate because: Need for IV fluids and medications.   Consultants:  None  Procedures:  None  Antimicrobials:  None   Subjective: Patient seen and evaluated today with ongoing abdominal pain as well as nausea, but no vomiting.  Objective: Vitals:   04/28/23  2200 04/28/23 2300 04/29/23 0153 04/29/23 0543  BP: (!) 143/88  (!) 164/93 (!) 168/101  Pulse: (!) 109 (!) 102 100 (!) 104  Resp: (!) 34 (!) 24 (!) 26 17  Temp:   99.2 F (37.3 C) 98.1 F (36.7 C)  TempSrc:   Oral Oral  SpO2: 100% 94% 98% 96%  Weight:   99.7 kg   Height:   5\' 5"  (1.651 m)     Intake/Output Summary (Last 24 hours) at 04/29/2023 8413 Last data filed at 04/29/2023 0107 Gross per 24 hour  Intake 50 ml  Output --  Net 50 ml   Filed Weights   04/28/23 2111 04/29/23 0153  Weight: 99 kg 99.7 kg    Examination:  General exam: Appears calm and comfortable  Respiratory system: Clear to auscultation. Respiratory effort normal. Cardiovascular system: S1 & S2 heard, RRR.  Gastrointestinal system: Abdomen is soft Central nervous system: Alert and awake Extremities: No edema Skin: No significant lesions noted Psychiatry: Flat affect.    Data Reviewed: I have personally reviewed following labs and imaging studies  CBC: Recent Labs  Lab 04/28/23 2140 04/29/23 0410  WBC 8.2 6.6  HGB 11.5* 11.0*  HCT 34.4* 34.7*  MCV 86.4 87.4  PLT 341 298   Basic Metabolic Panel: Recent Labs  Lab 04/28/23 2140 04/29/23 0410  NA 138 137  K 3.4* 3.7  CL 102 101  CO2 24 27  GLUCOSE 173* 156*  BUN 11 8  CREATININE 0.80 0.52  CALCIUM  9.4 9.0  MG  --  2.0  PHOS  --  2.9   GFR: Estimated Creatinine Clearance: 99.5 mL/min (by C-G formula based on SCr  of 0.52 mg/dL). Liver Function Tests: Recent Labs  Lab 04/28/23 2140 04/29/23 0410  AST 23 16  ALT 15 13  ALKPHOS 72 72  BILITOT 0.5 0.4  PROT 7.7 7.3  ALBUMIN 4.0 3.7   Recent Labs  Lab 04/28/23 2140  LIPASE 637*   No results for input(s): "AMMONIA" in the last 168 hours. Coagulation Profile: No results for input(s): "INR", "PROTIME" in the last 168 hours. Cardiac Enzymes: No results for input(s): "CKTOTAL", "CKMB", "CKMBINDEX", "TROPONINI" in the last 168 hours. BNP (last 3 results) No results for  input(s): "PROBNP" in the last 8760 hours. HbA1C: No results for input(s): "HGBA1C" in the last 72 hours. CBG: No results for input(s): "GLUCAP" in the last 168 hours. Lipid Profile: No results for input(s): "CHOL", "HDL", "LDLCALC", "TRIG", "CHOLHDL", "LDLDIRECT" in the last 72 hours. Thyroid  Function Tests: No results for input(s): "TSH", "T4TOTAL", "FREET4", "T3FREE", "THYROIDAB" in the last 72 hours. Anemia Panel: No results for input(s): "VITAMINB12", "FOLATE", "FERRITIN", "TIBC", "IRON", "RETICCTPCT" in the last 72 hours. Sepsis Labs: No results for input(s): "PROCALCITON", "LATICACIDVEN" in the last 168 hours.  No results found for this or any previous visit (from the past 240 hours).       Radiology Studies: CT ABDOMEN PELVIS W CONTRAST Result Date: 04/28/2023 CLINICAL DATA:  Status post cholecystectomy 2 weeks ago, presenting with upper abdominal pain. EXAM: CT ABDOMEN AND PELVIS WITH CONTRAST TECHNIQUE: Multidetector CT imaging of the abdomen and pelvis was performed using the standard protocol following bolus administration of intravenous contrast. RADIATION DOSE REDUCTION: This exam was performed according to the departmental dose-optimization program which includes automated exposure control, adjustment of the mA and/or kV according to patient size and/or use of iterative reconstruction technique. CONTRAST:  OMNIPAQUE  IOHEXOL  300 MG/ML  SOLN COMPARISON:  March 22, 2023 FINDINGS: Lower chest: No acute abnormality. Hepatobiliary: No focal liver abnormality is seen. Status post cholecystectomy. A very mild amount of residual inflammatory fat stranding is seen within the gallbladder fossa (axial CT images 37 through 43, CT series 2). No fluid collection or abscess is identified. No biliary dilatation. Pancreas: Unremarkable. No pancreatic ductal dilatation or surrounding inflammatory changes. Spleen: Normal in size without focal abnormality. Adrenals/Urinary Tract: Adrenal glands  are unremarkable. Kidneys are normal, without renal calculi, focal lesion, or hydronephrosis. Bladder is unremarkable. Stomach/Bowel: There is a small hiatal hernia. Appendix appears normal. No evidence of bowel wall thickening, distention, or inflammatory changes. Noninflamed diverticula are seen throughout the large bowel. Vascular/Lymphatic: No significant vascular findings are present. No enlarged abdominal or pelvic lymph nodes. Reproductive: Status post hysterectomy. No adnexal masses. Other: No abdominal wall hernia or abnormality. A very small amount of posterior pelvic free fluid is seen. Musculoskeletal: No acute osseous abnormalities are identified. IMPRESSION: 1. Findings consistent with the patient's history of recent cholecystectomy, with a very mild amount of residual inflammatory fat stranding within the gallbladder fossa. 2. Colonic diverticulosis. 3. Small hiatal hernia. Electronically Signed   By: Virgle Grime M.D.   On: 04/28/2023 23:33        Scheduled Meds:  cloNIDine   0.1 mg Transdermal Q Sun   enoxaparin  (LOVENOX ) injection  40 mg Subcutaneous Q24H   pantoprazole  (PROTONIX ) IV  40 mg Intravenous Q24H   Continuous Infusions:  lactated ringers  125 mL/hr at 04/29/23 0206   promethazine  (PHENERGAN ) injection (IM or IVPB) Stopped (04/29/23 0107)     LOS: 0 days    Time spent: 55 minutes    Shonteria Abeln Loran Rock, DO [facility]  If 7PM-7AM, please contact night-coverage [email or web address] 04/29/2023, 7:12 AM

## 2023-04-29 NOTE — Progress Notes (Signed)
   04/29/23 0153  Assess: MEWS Score  Temp 99.2 F (37.3 C)  BP (!) 164/93  MAP (mmHg) 114  Pulse Rate 100  Resp (!) 26  Level of Consciousness Alert  SpO2 98 %  O2 Device Room Air  Assess: MEWS Score  MEWS Temp 0  MEWS Systolic 0  MEWS Pulse 0  MEWS RR 2  MEWS LOC 0  MEWS Score 2  MEWS Score Color Yellow  Assess: if the MEWS score is Yellow or Red  Were vital signs accurate and taken at a resting state? Yes  Does the patient meet 2 or more of the SIRS criteria? Yes  Does the patient have a confirmed or suspected source of infection? Yes  MEWS guidelines implemented  Yes, yellow  Treat  MEWS Interventions Considered administering scheduled or prn medications/treatments as ordered  Take Vital Signs  Increase Vital Sign Frequency  Yellow: Q2hr x1, continue Q4hrs until patient remains green for 12hrs  Escalate  MEWS: Escalate Yellow: Discuss with charge nurse and consider notifying provider and/or RRT  Notify: Charge Nurse/RN  Name of Charge Nurse/RN Notified Tiffany, RN  Assess: SIRS CRITERIA  SIRS Temperature  0  SIRS Respirations  1  SIRS Pulse 1  SIRS WBC 0  SIRS Score Sum  2

## 2023-04-29 NOTE — Progress Notes (Signed)
   04/29/23 1738  TOC Assessment  Once discharged, how will the patient get to their discharge location? Other (Comment) Financial controller)  Expected Discharge Plan Home w Home Health Services  Barriers to Discharge Continued Medical Work up  Patient states their goals for this hospitalization and ongoing recovery are: Home  Living arrangements for the past 2 months Apartment  Lives with: Self  Share Information with NAME Case Manager  Patient language and need for interpreter reviewed: No  Criminal Activity/Legal Involvement Pertinent to Current Situation/Hospitalization No - Comment as needed  Appearance: Appears stated age  Attitude/Demeanor/Rapport Engaged  Affect (typically observed) Stable  Orientation:  Oriented to Self;Oriented to Place;Oriented to  Time;Oriented to Situation   Admitted with Acute chronic pancreatitis.

## 2023-04-29 NOTE — TOC Initial Note (Signed)
 Transition of Care [facility]) - Initial/Assessment Note    Patient Details  Name: Holly Mendez MRN: [id number] Date of Birth: 10-Apr-1975  [facility] of [facility]) CM/SW Contact:    Lynda Sands, RN Phone Number: 04/29/2023, 5:51 PM  Clinical Narrative:  CM met with patient at bedside. Patient admitted Acute chronic pancreatitis. Patient reported. She had broken wrist last year, surgery on her right knee. She reports she cannot been her right knee. Patient was at [facility] for gallbladder surgery.  Patient reports she lives alone. Patient is independent with adl's.  Patient stated that she could use some help with all the health issues. Patient does not drive. Patient reports she use medical transportation to go to her appointments. Patient states she had completed the forms to get an home aide. The organization that was to come to her home to the home assessment never came. DME: patient has a straight cane, rolling walker, and bedside commode.              Expected Discharge Plan: Home w Home Health Services Barriers to Discharge: Continued Medical Work up   Patient Goals and CMS Choice Patient states their goals for this hospitalization and ongoing recovery are:: Home       Expected Discharge Plan and Services       Living arrangements for the past 2 months: Apartment                    Prior Living Arrangements/Services Living arrangements for the past 2 months: Apartment Lives with:: Self Patient language and need for interpreter reviewed:: No     Criminal Activity/Legal Involvement Pertinent to Current Situation/Hospitalization: No - Comment as needed  Activities of Daily Living   ADL Screening (condition at time of admission) Independently performs ADLs?: Yes (appropriate for developmental age) Is the patient deaf or have difficulty hearing?: No Does the patient have difficulty seeing, even when wearing glasses/contacts?: No Does the  patient have difficulty concentrating, remembering, or making decisions?: No  Permission Sought/Granted      Share Information with NAME: Case Manager           Emotional Assessment Appearance:: Appears stated age Attitude/Demeanor/Rapport: Engaged Affect (typically observed): Stable Orientation: : Oriented to Self, Oriented to Place, Oriented to  Time, Oriented to Situation      Admission diagnosis:  Intractable nausea and vomiting [R11.2] Acute pancreatitis, unspecified complication status, unspecified pancreatitis type [K85.90] Patient Active Problem List   Diagnosis Date Noted   Cholecystitis 04/19/2023   Hypokalemia 03/23/2023   Intractable nausea and vomiting 03/23/2023   Schizoaffective disorder (HCC) 03/23/2023   Bipolar disorder (HCC) 03/23/2023   GERD (gastroesophageal reflux disease) 03/23/2023   Altered mental status-resolved 03/23/2023   Nausea and vomiting 03/23/2023   Status post total right knee replacement 03/06/2023   Right knee injury, sequela 08/03/2022   Gait abnormality 04/27/2022   Osteoarthritis of right knee 04/27/2022   Injury of meniscus of left knee 09/08/2021   Left knee pain 08/11/2021   Aftercare following right knee joint replacement surgery 05/27/2020   Chronic pancreatitis (HCC) 12/01/2019   H/O total vaginal hysterectomy 05/31/2019   Acute on chronic pancreatitis (HCC) 09/15/2018   Abdominal pain 12/05/2017   Spondylosis without myelopathy or radiculopathy, lumbosacral region 04/18/2017   Vitamin D  deficiency 04/18/2017   Chronic pain syndrome 04/04/2017   Problems influencing health status 04/04/2017   Bacterial vaginosis, recurrent    Blind left eye 12/10/2014   Possiblle  Anterior communicating artery aneurysm 10/30/2014   Hyperglycemia 10/17/2014   Morbid obesity (HCC) 09/24/2014   Meningitis, hx, 2016 09/21/2014   Reactive airway disease 11/21/2013   Depression 11/21/2013   Seasonal allergies 11/21/2013   Bipolar affective  disorder, currently in remission [facility]) 02/04/2011   Essential hypertension 02/04/2011   PCP:  Charise Companion, MD Pharmacy:   [facility] [place] [place] - [address] [place] [address] Phone: [phone] Fax: [phone]  [facility] [phone] - [place], [place] - [address] AT [facility] OF [address] [place] [address] Phone: [phone] Fax: [phone]     Social Drivers of Health (SDOH) Social History: SDOH Screenings   Food Insecurity: No Food Insecurity (04/29/2023)  Recent Concern: Food Insecurity - Food Insecurity Present (03/23/2023)  Housing: Low Risk  (04/29/2023)  Transportation Needs: Unmet Transportation Needs (04/29/2023)  Utilities: Not At Risk (04/29/2023)  Depression (PHQ2-9): High Risk (01/24/2023)  Social Connections: Socially Isolated (04/19/2023)  Tobacco Use: Low Risk  (04/28/2023)  Recent Concern: Tobacco Use - Medium Risk (02/12/2023)   SDOH Interventions:     Readmission Risk Interventions     No data to display
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
## 2023-04-30 ENCOUNTER — Encounter (HOSPITAL_COMMUNITY): Payer: Self-pay

## 2023-04-30 ENCOUNTER — Inpatient Hospital Stay (HOSPITAL_COMMUNITY): Payer: MEDICAID

## 2023-04-30 DIAGNOSIS — K859 Acute pancreatitis without necrosis or infection, unspecified: Secondary | ICD-10-CM | POA: Diagnosis not present

## 2023-04-30 LAB — COMPREHENSIVE METABOLIC PANEL WITH GFR
ALT: 11 U/L (ref 0–44)
AST: 15 U/L (ref 15–41)
Albumin: 3.3 g/dL — ABNORMAL LOW (ref 3.5–5.0)
Alkaline Phosphatase: 61 U/L (ref 38–126)
Anion gap: 7 (ref 5–15)
BUN: <5 mg/dL — ABNORMAL LOW (ref 6–20)
CO2: 26 mmol/L (ref 22–32)
Calcium: 9 mg/dL (ref 8.9–10.3)
Chloride: 107 mmol/L (ref 98–111)
Creatinine, Ser: 0.59 mg/dL (ref 0.44–1.00)
GFR, Estimated: >60 mL/min
Glucose, Bld: 99 mg/dL (ref 70–99)
Potassium: 4.2 mmol/L (ref 3.5–5.1)
Sodium: 140 mmol/L (ref 135–145)
Total Bilirubin: 0.4 mg/dL (ref 0.0–1.2)
Total Protein: 6.5 g/dL (ref 6.5–8.1)

## 2023-04-30 LAB — CBC
HCT: 35.6 % — ABNORMAL LOW (ref 36.0–46.0)
Hemoglobin: 10.9 g/dL — ABNORMAL LOW (ref 12.0–15.0)
MCH: 28.1 pg (ref 26.0–34.0)
MCHC: 30.6 g/dL (ref 30.0–36.0)
MCV: 91.8 fL (ref 80.0–100.0)
Platelets: 282 10*3/uL (ref 150–400)
RBC: 3.88 MIL/uL (ref 3.87–5.11)
RDW: 15.5 % (ref 11.5–15.5)
WBC: 7.1 10*3/uL (ref 4.0–10.5)
nRBC: 0 % (ref 0.0–0.2)

## 2023-04-30 LAB — MAGNESIUM: Magnesium: 2.1 mg/dL (ref 1.7–2.4)

## 2023-04-30 LAB — LIPASE, BLOOD: Lipase: 27 U/L (ref 11–51)

## 2023-04-30 MED ORDER — GADOBUTROL 1 MMOL/ML IV SOLN
10.0000 mL | Freq: Once | INTRAVENOUS | Status: AC | PRN
  Administered 2023-04-30: 10 mL via INTRAVENOUS

## 2023-04-30 MED ORDER — QUETIAPINE FUMARATE 100 MG PO TABS: 100.0000 mg | ORAL_TABLET | Freq: Every day | ORAL | Status: DC

## 2023-04-30 MED ORDER — ONDANSETRON HCL 4 MG PO TABS: 4.0000 mg | ORAL_TABLET | Freq: Every day | ORAL | 1 refills | Status: DC | PRN

## 2023-04-30 NOTE — Discharge Summary (Signed)
 Physician Discharge Summary  Holly Mendez XLK:[id number] DOB: [birth year] DOA: 04/28/2023  PCP: Holly Companion, MD  Admit date: 04/28/2023  Discharge date: 04/30/2023  Admitted From:Home  Disposition:  Home  Recommendations for Outpatient Follow-up:  Follow up with PCP in 1-2 weeks Continue home medications as prior and use Zofran  as needed for nausea or vomiting  Home Health: None  Equipment/Devices: None  Discharge Condition:Stable  CODE STATUS: Full  Diet recommendation: Heart Healthy/low-fat  Brief/Interim Summary: Holly Mendez is a 48 y.o. female with medical history significant of hypertension, GERD, chronic pancreatitis who presents to the emergency department who presents to the emergency department due to abdominal pain which woke her up from a nap in the morning yesterday (04/19).  Patient has been admitted with acute on chronic pancreatitis in the setting of recent laparoscopic cholecystectomy.  MRCP performed with no acute findings noted and she is now tolerating diet with no further pain, nausea, or vomiting and is eager for discharge.  No other acute events or concerns noted.  Discharge Diagnoses:  Active Problems:   Essential hypertension   Hypokalemia   Abdominal pain   Acute on chronic pancreatitis (HCC)   GERD (gastroesophageal reflux disease)   Nausea and vomiting  Principal discharge diagnosis: Acute on chronic pancreatitis in the setting of recent cholecystectomy.  Discharge Instructions  Discharge Instructions     Diet - low sodium heart healthy   Complete by: As directed    Low fat.   Increase activity slowly   Complete by: As directed       Allergies as of 04/30/2023       Reactions   Lyrica  [pregabalin ] Swelling   Swelling of hands and feet   Zithromax [azithromycin] Other (See Comments)   Severe stomach cramping   Celebrex  [celecoxib ] Rash   Nabumetone  Swelling, Dermatitis, Rash   Naproxen  Swelling   Norvasc  [amlodipine   Besylate] Other (See Comments)   Mouth irritation         Medication List     TAKE these medications    cetirizine  10 MG tablet Commonly known as: ZYRTEC  Take 10 mg by mouth in the morning.   cloNIDine  0.1 mg/24hr patch Commonly known as: CATAPRES  - Dosed in mg/24 hr PLACE 1 PATCH ONTO SKIN ONCE WEEKLY. What changed:  how much to take how to take this when to take this   divalproex  500 MG DR tablet Commonly known as: DEPAKOTE  Take 500 mg by mouth 2 (two) times daily.   esomeprazole  40 MG capsule Commonly known as: NEXIUM  Take 1 capsule (40 mg total) by mouth 2 (two) times daily before a meal.   fluconazole  200 MG tablet Commonly known as: DIFLUCAN  TAKE 1 TABLET BY MOUTH EVERY DAY UNTIL GONE   gabapentin  300 MG capsule Commonly known as: NEURONTIN  Take 600 mg by mouth at bedtime.   HYDROcodone -acetaminophen  5-325 MG tablet Commonly known as: NORCO/VICODIN TAKE TWO TABLETS BY MOUTH EVERY MORNING, 1 TAB MIDDAY AND 1 TAB AT NIGHT   hydrOXYzine  25 MG tablet Commonly known as: ATARAX  Take 25 mg by mouth 3 (three) times daily.   Narcan  4 MG/0.1ML Liqd nasal spray kit Generic drug: naloxone  Place 1 spray into the nose daily as needed (overdose).   ondansetron  4 MG tablet Commonly known as: Zofran  Take 1 tablet (4 mg total) by mouth daily as needed for nausea or vomiting.   pantoprazole  40 MG tablet Commonly known as: PROTONIX  Take 40 mg by mouth daily.   QUEtiapine  400 MG  tablet Commonly known as: SEROQUEL  Take 400 mg by mouth at bedtime.   sucralfate  1 GM/10ML suspension Commonly known as: CARAFATE  Take 10 mLs (1 g total) by mouth every 6 (six) hours. What changed: when to take this   Symbicort  80-4.5 MCG/ACT inhaler Generic drug: budesonide -formoterol  INHALE 2 PUFFS INTO THE LUNGS TWICE DAILY AS NEEDED. What changed:  how much to take how to take this when to take this reasons to take this additional instructions   topiramate  100 MG  tablet Commonly known as: TOPAMAX  Take 1 tablet (100 mg total) by mouth 2 (two) times daily. By psychiatry What changed: when to take this   Ventolin  HFA 108 (90 Base) MCG/ACT inhaler Generic drug: albuterol  INHALE TWO PUFFS INTO THE LUNGS EVERY 6 HOURS AS NEEDED WHEEZING OR SHORTNESS OF BREATH        Follow-up Information     Holly Companion, MD. Schedule an appointment as soon as possible for a visit in 1 week(s).   Specialties: Family Medicine, Sports Medicine Contact information: [address] [place] [address] [phone]                Allergies  Allergen Reactions   Lyrica  [Pregabalin ] Swelling    Swelling of hands and feet   Zithromax [Azithromycin] Other (See Comments)    Severe stomach cramping   Celebrex  [Celecoxib ] Rash   Nabumetone  Swelling, Dermatitis and Rash   Naproxen  Swelling   Norvasc  [Amlodipine  Besylate] Other (See Comments)    Mouth irritation     Consultations: None   Procedures/Studies: MR ABDOMEN MRCP W WO CONTAST Result Date: 04/30/2023 CLINICAL DATA:  Abdominal pain. Status post laparoscopic cholecystectomy 04/20/2023. EXAM: MRI ABDOMEN WITHOUT AND WITH CONTRAST (INCLUDING MRCP) TECHNIQUE: Multiplanar multisequence MR imaging of the abdomen was performed both before and after the administration of intravenous contrast. Heavily T2-weighted images of the biliary and pancreatic ducts were obtained, and three-dimensional MRCP images were rendered by post processing. CONTRAST:  10mL GADAVIST  GADOBUTROL  1 MMOL/ML IV SOLN COMPARISON:  Abdominopelvic CT 04/28/2023. Abdominal ultrasound 04/19/2023. FINDINGS: Technical note: Despite efforts by the technologist and patient, mild motion artifact is present on today's exam and could not be eliminated. This reduces exam sensitivity and specificity. Lower chest:  The visualized lower chest appears unremarkable. Hepatobiliary: The liver has a non cirrhotic morphology without significant steatosis  on gradient echo imaging. No focal lesion or suspicious enhancement following contrast. There is no intra or extrahepatic biliary dilatation. Postsurgical changes from recent cholecystectomy with a small amount of ill-defined fluid in the cholecystectomy bed. No organized fluid collection identified. No evidence of choledocholithiasis. Pancreas: Unremarkable. No pancreatic ductal dilatation or surrounding inflammatory changes. Spleen: Normal in size without focal abnormality. Adrenals/Urinary Tract: Both adrenal glands appear normal. No evidence of renal mass, hydronephrosis or perinephric soft tissue stranding. Stomach/Bowel: The stomach appears unremarkable for its degree of distension. No evidence of bowel wall thickening, distention or surrounding inflammatory change. Vascular/Lymphatic: There are no enlarged abdominal lymph nodes. No significant vascular findings. Other: As above, postsurgical changes from recent cholecystectomy with edema and a small amount of fluid in the cholecystectomy bed. There is also edema around the umbilical port in the anterior abdominal wall. A small amount of ascites is noted with components in the pelvis. Musculoskeletal: No acute or significant osseous findings. IMPRESSION: 1. Postsurgical changes from recent cholecystectomy with a small amount of ill-defined fluid in the cholecystectomy bed. No organized fluid collection identified. 2. No evidence of choledocholithiasis or biliary dilatation. 3.  Small amount of ascites with components in the pelvis. This fluid is nonspecific in etiology. If clinical concern for postoperative bile leak, consider further evaluation with nuclear medicine hepatobiliary scan. 4. No other significant findings. Electronically Signed   By: Elmon Hagedorn M.D.   On: 04/30/2023 12:56   MR 3D Recon At Scanner Result Date: 04/30/2023 CLINICAL DATA:  Abdominal pain. Status post laparoscopic cholecystectomy 04/20/2023. EXAM: MRI ABDOMEN WITHOUT AND WITH  CONTRAST (INCLUDING MRCP) TECHNIQUE: Multiplanar multisequence MR imaging of the abdomen was performed both before and after the administration of intravenous contrast. Heavily T2-weighted images of the biliary and pancreatic ducts were obtained, and three-dimensional MRCP images were rendered by post processing. CONTRAST:  10mL GADAVIST  GADOBUTROL  1 MMOL/ML IV SOLN COMPARISON:  Abdominopelvic CT 04/28/2023. Abdominal ultrasound 04/19/2023. FINDINGS: Technical note: Despite efforts by the technologist and patient, mild motion artifact is present on today's exam and could not be eliminated. This reduces exam sensitivity and specificity. Lower chest:  The visualized lower chest appears unremarkable. Hepatobiliary: The liver has a non cirrhotic morphology without significant steatosis on gradient echo imaging. No focal lesion or suspicious enhancement following contrast. There is no intra or extrahepatic biliary dilatation. Postsurgical changes from recent cholecystectomy with a small amount of ill-defined fluid in the cholecystectomy bed. No organized fluid collection identified. No evidence of choledocholithiasis. Pancreas: Unremarkable. No pancreatic ductal dilatation or surrounding inflammatory changes. Spleen: Normal in size without focal abnormality. Adrenals/Urinary Tract: Both adrenal glands appear normal. No evidence of renal mass, hydronephrosis or perinephric soft tissue stranding. Stomach/Bowel: The stomach appears unremarkable for its degree of distension. No evidence of bowel wall thickening, distention or surrounding inflammatory change. Vascular/Lymphatic: There are no enlarged abdominal lymph nodes. No significant vascular findings. Other: As above, postsurgical changes from recent cholecystectomy with edema and a small amount of fluid in the cholecystectomy bed. There is also edema around the umbilical port in the anterior abdominal wall. A small amount of ascites is noted with components in the  pelvis. Musculoskeletal: No acute or significant osseous findings. IMPRESSION: 1. Postsurgical changes from recent cholecystectomy with a small amount of ill-defined fluid in the cholecystectomy bed. No organized fluid collection identified. 2. No evidence of choledocholithiasis or biliary dilatation. 3. Small amount of ascites with components in the pelvis. This fluid is nonspecific in etiology. If clinical concern for postoperative bile leak, consider further evaluation with nuclear medicine hepatobiliary scan. 4. No other significant findings. Electronically Signed   By: Elmon Hagedorn M.D.   On: 04/30/2023 12:56   CT ABDOMEN PELVIS W CONTRAST Result Date: 04/28/2023 CLINICAL DATA:  Status post cholecystectomy 2 weeks ago, presenting with upper abdominal pain. EXAM: CT ABDOMEN AND PELVIS WITH CONTRAST TECHNIQUE: Multidetector CT imaging of the abdomen and pelvis was performed using the standard protocol following bolus administration of intravenous contrast. RADIATION DOSE REDUCTION: This exam was performed according to the departmental dose-optimization program which includes automated exposure control, adjustment of the mA and/or kV according to patient size and/or use of iterative reconstruction technique. CONTRAST:  OMNIPAQUE  IOHEXOL  300 MG/ML  SOLN COMPARISON:  March 22, 2023 FINDINGS: Lower chest: No acute abnormality. Hepatobiliary: No focal liver abnormality is seen. Status post cholecystectomy. A very mild amount of residual inflammatory fat stranding is seen within the gallbladder fossa (axial CT images 37 through 43, CT series 2). No fluid collection or abscess is identified. No biliary dilatation. Pancreas: Unremarkable. No pancreatic ductal dilatation or surrounding inflammatory changes. Spleen: Normal in size without focal abnormality. Adrenals/Urinary Tract:  Adrenal glands are unremarkable. Kidneys are normal, without renal calculi, focal lesion, or hydronephrosis. Bladder is  unremarkable. Stomach/Bowel: There is a small hiatal hernia. Appendix appears normal. No evidence of bowel wall thickening, distention, or inflammatory changes. Noninflamed diverticula are seen throughout the large bowel. Vascular/Lymphatic: No significant vascular findings are present. No enlarged abdominal or pelvic lymph nodes. Reproductive: Status post hysterectomy. No adnexal masses. Other: No abdominal wall hernia or abnormality. A very small amount of posterior pelvic free fluid is seen. Musculoskeletal: No acute osseous abnormalities are identified. IMPRESSION: 1. Findings consistent with the patient's history of recent cholecystectomy, with a very mild amount of residual inflammatory fat stranding within the gallbladder fossa. 2. Colonic diverticulosis. 3. Small hiatal hernia. Electronically Signed   By: Virgle Grime M.D.   On: 04/28/2023 23:33   US  Abdomen Limited RUQ (LIVER/GB) Result Date: 04/19/2023 CLINICAL DATA:  [id number] RUQ abdominal pain [id number] EXAM: ULTRASOUND ABDOMEN LIMITED RIGHT UPPER QUADRANT COMPARISON:  March 26, 2023 FINDINGS: Gallbladder: Multiple gallstones. No wall thickening or pericholecystic fluid. Positive sonographic Abigail Abler sign reported by the sonographer. Common bile duct: Diameter: 3 mm Liver: No focal lesion identified. Within normal limits in parenchymal echogenicity. Portal vein is patent on color Doppler imaging with normal direction of blood flow towards the liver. Other: None. IMPRESSION: Multiple gallstones. Positive sonographic Abigail Abler sign reported by the sonographer. In the absence of wall thickening and pericholecystic fluid, the study is equivocal for acute cholecystitis. If this remains of concern, a hepatobiliary scan could be considered. Electronically Signed   By: Rance Burrows M.D.   On: 04/19/2023 18:27     Discharge Exam: Vitals:   04/30/23 0505 04/30/23 1240  BP: 107/71 98/67  Pulse: 91 94  Resp:  18  Temp:  98.4 F (36.9 C)  SpO2: 100% 100%    Vitals:   04/29/23 1315 04/29/23 2109 04/30/23 0505 04/30/23 1240  BP: (!) 117/59 111/66 107/71 98/67  Pulse:  97 91 94  Resp: 18 17  18   Temp: 98.7 F (37.1 C) 98.4 F (36.9 C)  98.4 F (36.9 C)  TempSrc:  Oral    SpO2: 100% 98% 100% 100%  Weight:      Height:        General: Pt is alert, awake, not in acute distress Cardiovascular: RRR, S1/S2 +, no rubs, no gallops Respiratory: CTA bilaterally, no wheezing, no rhonchi Abdominal: Soft, NT, ND, bowel sounds + Extremities: no edema, no cyanosis    The results of significant diagnostics from this hospitalization (including imaging, microbiology, ancillary and laboratory) are listed below for reference.     Microbiology: No results found for this or any previous visit (from the past 240 hours).   Labs: BNP (last 3 results) No results for input(s): "BNP" in the last 8760 hours. Basic Metabolic Panel: Recent Labs  Lab 04/28/23 2140 04/29/23 0410 04/30/23 0434  NA 138 137 140  K 3.4* 3.7 4.2  CL 102 101 107  CO2 24 27 26   GLUCOSE 173* 156* 99  BUN 11 8 <5*  CREATININE 0.80 0.52 0.59  CALCIUM  9.4 9.0 9.0  MG  --  2.0 2.1  PHOS  --  2.9  --    Liver Function Tests: Recent Labs  Lab 04/28/23 2140 04/29/23 0410 04/30/23 0434  AST 23 16 15   ALT 15 13 11   ALKPHOS 72 72 61  BILITOT 0.5 0.4 0.4  PROT 7.7 7.3 6.5  ALBUMIN 4.0 3.7 3.3*   Recent Labs  Lab 04/28/23  2140 04/30/23 0520  LIPASE 637* 27   No results for input(s): "AMMONIA" in the last 168 hours. CBC: Recent Labs  Lab 04/28/23 2140 04/29/23 0410 04/30/23 0434  WBC 8.2 6.6 7.1  HGB 11.5* 11.0* 10.9*  HCT 34.4* 34.7* 35.6*  MCV 86.4 87.4 91.8  PLT 341 298 282   Cardiac Enzymes: No results for input(s): "CKTOTAL", "CKMB", "CKMBINDEX", "TROPONINI" in the last 168 hours. BNP: Invalid input(s): "POCBNP" CBG: No results for input(s): "GLUCAP" in the last 168 hours. D-Dimer No results for input(s): "DDIMER" in the last 72 hours. Hgb  A1c No results for input(s): "HGBA1C" in the last 72 hours. Lipid Profile Recent Labs    04/29/23 0410  CHOL 167  HDL 46  LDLCALC 106*  TRIG 76  CHOLHDL 3.6   Thyroid  function studies No results for input(s): "TSH", "T4TOTAL", "T3FREE", "THYROIDAB" in the last 72 hours.  Invalid input(s): "FREET3" Anemia work up No results for input(s): "VITAMINB12", "FOLATE", "FERRITIN", "TIBC", "IRON", "RETICCTPCT" in the last 72 hours. Urinalysis    Component Value Date/Time   COLORURINE YELLOW 04/28/2023 2144   APPEARANCEUR CLEAR 04/28/2023 2144   LABSPEC 1.041 (H) 04/28/2023 2144   PHURINE 7.0 04/28/2023 2144   GLUCOSEU 50 (A) 04/28/2023 2144   HGBUR NEGATIVE 04/28/2023 2144   BILIRUBINUR NEGATIVE 04/28/2023 2144   KETONESUR NEGATIVE 04/28/2023 2144   PROTEINUR NEGATIVE 04/28/2023 2144   UROBILINOGEN 0.2 09/21/2014 1101   NITRITE NEGATIVE 04/28/2023 2144   LEUKOCYTESUR NEGATIVE 04/28/2023 2144   Sepsis Labs Recent Labs  Lab 04/28/23 2140 04/29/23 0410 04/30/23 0434  WBC 8.2 6.6 7.1   Microbiology No results found for this or any previous visit (from the past 240 hours).   Time coordinating discharge: 35 minutes  SIGNED:   Cornelius Dill, DO [facility] 04/30/2023, 2:20 PM  If 7PM-7AM, please contact night-coverage [email or web address]

## 2023-04-30 NOTE — Progress Notes (Signed)
 Mobility Specialist Progress Note:    04/30/23 1015  Mobility  Activity Ambulated with assistance in hallway;Transferred from bed to chair  Level of Assistance Standby assist, set-up cues, supervision of patient - no hands on  Assistive Device Front wheel walker  Distance Ambulated (ft) 120 ft  Range of Motion/Exercises Active;All extremities  Activity Response Tolerated well  Mobility Referral Yes  Mobility visit 1 Mobility  Mobility Specialist Start Time (ACUTE ONLY) 0920  Mobility Specialist Stop Time (ACUTE ONLY) 0940  Mobility Specialist Time Calculation (min) (ACUTE ONLY) 20 min   Pt received in bed, agreeable to mobility. Required supervision to stand and ambulate with RW. Tolerated well, asx throughout. Returned to room, left pt in chair with call bell. All needs met.  Glinda Lapping Mobility Specialist Please contact via Special educational needs teacher or  Rehab office at [phone]

## 2023-04-30 NOTE — Therapy (Signed)
 [facility] Outpatient Rehabilitation at [facility] [address] [place], [place], [address] Phone: [phone]   Fax:  [phone]  Patient Details  Name: Holly Mendez MRN: [id number] Date of Birth: [birth year] Referring Provider:  No ref. provider found  Encounter Date: 04/30/2023  PHYSICAL THERAPY DISCHARGE SUMMARY  Visits from Start of Care: 1  Current functional level related to goals / functional outcomes: unknown   Remaining deficits: unknown   Education / Equipment: HEP   Patient agrees to discharge. Patient goals were not met. Patient is being discharged due to not returning since the last visit.    9:09 AM, 04/30/23 [name] [name] MPT [place] [facility] [place] [phone]   [facility] Outpatient Rehabilitation at [facility] [address] [place], [place], [address] Phone: [phone]   Fax:  [phone]

## 2023-04-30 NOTE — Plan of Care (Signed)
  Problem: Education: Goal: Knowledge of General Education information will improve Description: Including pain rating scale, medication(s)/side effects and non-pharmacologic comfort measures Outcome: Progressing   Problem: Health Behavior/Discharge Planning: Goal: Ability to manage health-related needs will improve Outcome: Progressing   Problem: Clinical Measurements: Goal: Ability to maintain clinical measurements within normal limits will improve Outcome: Progressing Goal: Will remain free from infection Outcome: Progressing   Problem: Activity: Goal: Risk for activity intolerance will decrease Outcome: Progressing   Problem: Coping: Goal: Level of anxiety will decrease Outcome: Progressing   Problem: Pain Managment: Goal: General experience of comfort will improve and/or be controlled Outcome: Progressing   Problem: Safety: Goal: Ability to remain free from injury will improve Outcome: Progressing

## 2023-04-30 NOTE — Plan of Care (Signed)
  Problem: Education: Goal: Knowledge of General Education information will improve Description: Including pain rating scale, medication(s)/side effects and non-pharmacologic comfort measures Outcome: Progressing   Problem: Health Behavior/Discharge Planning: Goal: Ability to manage health-related needs will improve Outcome: Progressing   Problem: Clinical Measurements: Goal: Ability to maintain clinical measurements within normal limits will improve Outcome: Progressing Goal: Will remain free from infection Outcome: Progressing   Problem: Activity: Goal: Risk for activity intolerance will decrease Outcome: Progressing   Problem: Pain Managment: Goal: General experience of comfort will improve and/or be controlled Outcome: Progressing   Problem: Safety: Goal: Ability to remain free from injury will improve Outcome: Progressing

## 2023-04-30 NOTE — Progress Notes (Signed)
 Pt stated she takes 100 mg of Seroquel  at home. She will refuse 400 mg that is sch from [facility]. Pt states 400 mg is to strong, she will not be able to function with a high dose. Informed pt her request will be given to day shift nurse to pass onto the MD for review.

## 2023-05-01 ENCOUNTER — Encounter (HOSPITAL_COMMUNITY): Payer: MEDICAID | Admitting: Physical Therapy

## 2023-05-01 ENCOUNTER — Telehealth: Payer: Self-pay

## 2023-05-01 NOTE — Transitions of Care (Post Inpatient/ED Visit) (Signed)
   05/01/2023  Name: Holly Mendez MRN: [id number] DOB: [birth year]  Today's TOC FU Call Status: Today's TOC FU Call Status:: Successful TOC FU Call Completed TOC FU Call Complete Date: 05/01/23 Patient's Name and Date of Birth confirmed.  Transition Care Management Follow-up Telephone Call Date of Discharge: 04/30/23 Dis[facility] Type of Discharge: Inpatient Admission Primary Inpatient Discharge Diagnosis:: pancreatitis How have you been since you were released from the hospital?: Better Any questions or concerns?: No  Items Reviewed: Did you receive and understand the discharge instructions provided?: Yes Medications obtained,verified, and reconciled?: Yes (Medications Reviewed) Any new allergies since your discharge?: No Dietary orders reviewed?: Yes Do you have support at home?: No  Medications Reviewed Today: Medications Reviewed Today   Medications were not reviewed in this encounter     Home Care and Equipment/Supplies: Were Home Health Services Ordered?: NA Any new equipment or medical supplies ordered?: NA  Functional Questionnaire: Do you need assistance with bathing/showering or dressing?: No Do you need assistance with meal preparation?: No Do you need assistance with eating?: No Do you have difficulty maintaining continence: No Do you need assistance with getting out of bed/getting out of a chair/moving?: No Do you have difficulty managing or taking your medications?: No  Follow up appointments reviewed: PCP Follow-up appointment confirmed?: Yes Date of PCP follow-up appointment?: 05/02/23 Follow-up Provider: [facility] Follow-up appointment confirmed?: Yes Date of Specialist follow-up appointment?: 05/07/23 Follow-Up Specialty Provider:: GI Do you need transportation to your follow-up appointment?: No Do you understand care options if your condition(s) worsen?: Yes-patient verbalized understanding    SIGNATURE  Darrall Ellison, LPN [facility] Nurse Health Advisor Direct Dial [phone]

## 2023-05-02 ENCOUNTER — Ambulatory Visit (INDEPENDENT_AMBULATORY_CARE_PROVIDER_SITE_OTHER): Payer: MEDICAID | Admitting: Family Medicine

## 2023-05-02 VITALS — BP 125/84 | HR 89 | Wt 221.8 lb

## 2023-05-02 DIAGNOSIS — G894 Chronic pain syndrome: Secondary | ICD-10-CM

## 2023-05-02 DIAGNOSIS — R109 Unspecified abdominal pain: Secondary | ICD-10-CM | POA: Diagnosis not present

## 2023-05-02 DIAGNOSIS — F3170 Bipolar disorder, currently in remission, most recent episode unspecified: Secondary | ICD-10-CM | POA: Diagnosis not present

## 2023-05-02 NOTE — Patient Instructions (Addendum)
 I would definitely get back on your Seroquel , even if you have to take it one half tab twice a day.  Try top keep eating small amounts. Let me see you in about 6 weeks.  Great to see yoU!

## 2023-05-02 NOTE — Discharge Summary (Signed)
 [facility] Discharge Summary   Patient ID: Holly Mendez MRN: [id number] DOB/AGE: May 18, 1975 48 y.o.  Admit date: 04/19/2023 Discharge date: 04/21/2023  Admitting Diagnosis: Cholecystitis [K81.9]   Discharge Diagnosis Cholecystitis [K81.9]  Consultants None  Imaging: No results found.  Procedures Dr. Melton Squires (04/20/23) - LAPAROSCOPIC CHOLECYSTECTOMY (N/A) - LAP CHOLE WITH ICG DYE   Hospital Course:  48 y.o. female who presented to the ED with abdominal pain, nausea, vomiting.  Workup showed cholecystitis.  Patient was admitted and underwent procedure listed above.  Tolerated procedure well and was transferred to the floor.  Diet was advanced as tolerated.  On POD1, the patient was voiding well, tolerating diet, ambulating well, pain well controlled, vital signs stable, incisions c/d/i and felt stable for discharge home.  Patient will follow up in our office in 3 weeks and knows to call with questions or concerns.     I was not directly involved in this patient's care therefore the information in this discharge summary was taken from the chart.   Allergies as of 04/21/2023       Reactions   Lyrica  [pregabalin ] Swelling   Swelling of hands and feet   Celebrex  [celecoxib ] Rash   Gabapentin  Swelling   Pt denies this   Naproxen  Swelling   Norvasc  [amlodipine  Besylate] Other (See Comments)   Mouth irritation    Zithromax [azithromycin] Other (See Comments)   Severe stomach cramping   Nabumetone  Swelling, Rash        Medication List     TAKE these medications    cetirizine  10 MG tablet Commonly known as: ZYRTEC  Take 10 mg by mouth in the morning.   cloNIDine  0.1 mg/24hr patch Commonly known as: CATAPRES  - Dosed in mg/24 hr PLACE 1 PATCH ONTO SKIN ONCE WEEKLY. What changed:  how much to take how to take this when to take this   divalproex  500 MG DR tablet Commonly known as: DEPAKOTE  Take 500 mg by mouth 2 (two) times daily.   esomeprazole   40 MG capsule Commonly known as: NEXIUM  Take 1 capsule (40 mg total) by mouth 2 (two) times daily before a meal.   fluconazole  200 MG tablet Commonly known as: DIFLUCAN  TAKE 1 TABLET BY MOUTH EVERY DAY UNTIL GONE   gabapentin  300 MG capsule Commonly known as: NEURONTIN  Take 600 mg by mouth at bedtime.   HYDROcodone -acetaminophen  5-325 MG tablet Commonly known as: NORCO/VICODIN TAKE TWO TABLETS BY MOUTH EVERY MORNING, 1 TAB MIDDAY AND 1 TAB AT NIGHT   Narcan  4 MG/0.1ML Liqd nasal spray kit Generic drug: naloxone  Place 1 spray into the nose daily as needed (overdose).   QUEtiapine  400 MG tablet Commonly known as: SEROQUEL  Take 400 mg by mouth at bedtime.   Symbicort  80-4.5 MCG/ACT inhaler Generic drug: budesonide -formoterol  INHALE 2 PUFFS INTO THE LUNGS TWICE DAILY AS NEEDED. What changed:  how much to take how to take this when to take this reasons to take this additional instructions   topiramate  100 MG tablet Commonly known as: TOPAMAX  Take 1 tablet (100 mg total) by mouth 2 (two) times daily. By psychiatry What changed: when to take this   Ventolin  HFA 108 (90 Base) MCG/ACT inhaler Generic drug: albuterol  INHALE TWO PUFFS INTO THE LUNGS EVERY 6 HOURS AS NEEDED WHEEZING OR SHORTNESS OF BREATH          Follow-up Information     Maczis, Puja Gosai, PA-C Follow up on 05/15/2023.   Specialty: General Surgery Why: 11:30am, Arrive 30 minutes prior to  your appointment time, Please bring your insurance card and photo ID Contact information: [address] [place] [address] [facility] [place] [address] [phone]                 Signed: Elwin Hammond , [facility] 05/02/2023, 11:53 AM Please see Amion for pager number during day hours 7:00am-4:30pm

## 2023-05-03 ENCOUNTER — Encounter (HOSPITAL_COMMUNITY): Payer: MEDICAID

## 2023-05-03 NOTE — Assessment & Plan Note (Signed)
 She has some chronic issues.  Recent cholecystectomy.  I suspect her stressors and discontinuation of her psychiatry medications is also contributing.  Urged her to eat small meals throughout the day.  Try to get at least fluids on a regular basis.  Restart mental health medications.  Follow-up 1 month.

## 2023-05-03 NOTE — Assessment & Plan Note (Signed)
 Do not think we should increase her pain medication at this time and have discussed this with her at length.

## 2023-05-03 NOTE — Assessment & Plan Note (Signed)
 Urged her to get back on her mental health meds.  I think that we will also probably help her stomach and digestion issues.

## 2023-05-03 NOTE — Progress Notes (Signed)
    CHIEF COMPLAINT / HPI: Hospital follow-up recent cholecystectomy.  Still having a lot of abdominal discomfort.  Not able to eat much. 2.  Stopped her mental health medications about 3 week ago because she was not eating well after the cholecystectomy. #3.  Knee pain: Still following up with her orthopedist and is back in physical therapy.  Having a lot of swelling.  Still unable to fully flex the knee.  Having more pain then she would like and wonders if she can increase her pain medicine regimen   PERTINENT  PMH / PSH: I have reviewed the patient's medications, allergies, past medical and surgical history, smoking status and updated in the EMR as appropriate.   OBJECTIVE:  BP 125/84   Pulse 89   Wt 221 lb 12.8 oz (100.6 kg)   LMP 08/31/2018   SpO2 100%   BMI 36.91 kg/m  GENERAL: Well-developed no acute distress ABDOMEN: Soft, positive bowel sounds.  Well-healing portal sites from her cholecystectomy KNEE: Right.  Essentially full extension but lacks flexion beyond about 100 degrees. PSYCH: Alert and oriented x 4.  No agitation.  No psychomotor retardation.  Asks and answers questions appropriately.  Speech is very slightly pressured.  ASSESSMENT / PLAN:   Bipolar affective disorder, currently in remission [facility]) Urged her to get back on her mental health meds.  I think that we will also probably help her stomach and digestion issues.  Abdominal discomfort She has some chronic issues.  Recent cholecystectomy.  I suspect her stressors and discontinuation of her psychiatry medications is also contributing.  Urged her to eat small meals throughout the day.  Try to get at least fluids on a regular basis.  Restart mental health medications.  Follow-up 1 month.  Chronic pain syndrome Do not think we should increase her pain medication at this time and have discussed this with her at length.   Holly Grice MD

## 2023-05-07 ENCOUNTER — Ambulatory Visit (INDEPENDENT_AMBULATORY_CARE_PROVIDER_SITE_OTHER): Payer: MEDICAID | Admitting: Gastroenterology

## 2023-05-07 ENCOUNTER — Other Ambulatory Visit: Payer: Self-pay | Admitting: Orthopaedic Surgery

## 2023-05-07 ENCOUNTER — Telehealth: Payer: Self-pay | Admitting: Orthopaedic Surgery

## 2023-05-07 ENCOUNTER — Encounter: Payer: Self-pay | Admitting: Gastroenterology

## 2023-05-07 VITALS — BP 114/70 | HR 94 | Ht 65.0 in | Wt 229.1 lb

## 2023-05-07 DIAGNOSIS — R1013 Epigastric pain: Secondary | ICD-10-CM

## 2023-05-07 DIAGNOSIS — K2100 Gastro-esophageal reflux disease with esophagitis, without bleeding: Secondary | ICD-10-CM

## 2023-05-07 DIAGNOSIS — R194 Change in bowel habit: Secondary | ICD-10-CM

## 2023-05-07 DIAGNOSIS — R110 Nausea: Secondary | ICD-10-CM

## 2023-05-07 MED ORDER — ONDANSETRON 4 MG PO TBDP: 4.0000 mg | ORAL_TABLET | Freq: Two times a day (BID) | ORAL | 0 refills | Status: DC | PRN

## 2023-05-07 MED ORDER — OXYCODONE HCL 5 MG PO TABS: 5.0000 mg | ORAL_TABLET | Freq: Four times a day (QID) | ORAL | 0 refills | Status: DC | PRN

## 2023-05-07 MED ORDER — ESOMEPRAZOLE MAGNESIUM 40 MG PO CPDR: 40.0000 mg | DELAYED_RELEASE_CAPSULE | Freq: Two times a day (BID) | ORAL | 0 refills | Status: DC

## 2023-05-07 NOTE — Patient Instructions (Addendum)
 _______________________________________________________  If your blood pressure at your visit was 140/90 or greater, please contact your primary care physician to follow up on this.  _______________________________________________________  If you are age 50 or older, your body mass index should be between 23-30. Your Body mass index is 38.13 kg/m. If this is out of the aforementioned range listed, please consider follow up with your Primary Care Provider.  If you are age 79 or younger, your body mass index should be between 19-25. Your Body mass index is 38.13 kg/m. If this is out of the aformentioned range listed, please consider follow up with your Primary Care Provider.   ________________________________________________________  The [facility] providers would like to encourage you to use MYCHART to communicate with providers for non-urgent requests or questions.  Due to long hold times on the telephone, sending your provider a message by [facility] may be a faster and more efficient way to get a response.  Please allow 48 business hours for a response.  Please remember that this is for non-urgent requests.  _______________________________________________________    Patient will start Nexium  40 mg po twice daily 1 tablet twice daily for 4 weeks, then daily once daily Recommend GERD diet, no late meals 3-4 hours before lying down. Recommend No NSAIDs. Recommend No alcohol.  Diet as tolerated. Avoid fatty greasy foods.   Follow up for 07-25-23 at 9am. Please call with any questions or concerns.  It was a pleasure to see you today!  Thank you for trusting me with your gastrointestinal care!
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
## 2023-05-07 NOTE — Telephone Encounter (Signed)
 Patient called. She would like a refill on oxycodone  sent to [facility] on [address].

## 2023-05-15 ENCOUNTER — Telehealth: Payer: Self-pay

## 2023-05-15 NOTE — Telephone Encounter (Signed)
 Patient calls nurse line requesting a refill on Hydrocodone .   She reports it is not due to be filled until 5/14. However, wanted to go ahead and put in request to allow time for PCP to send in.  I do not see this on her medication list anymore.   Hydrocodone  5-325mg  #120. [facility] [address].   Will forward to PCP.

## 2023-05-15 NOTE — Telephone Encounter (Signed)
 It looks like Dr. Lucienne Ryder gave her oxycodone  on the 28th.  I will have to calculate that in to the amount of time so she probably cannot get it filled on the 14th as she had originally planned.  I did not think he was going to give her any more medication.  Let her know that it will delay her getting the hydrocodone  probably for a week so it will not be ready until the 21st.  Just let her know this .She should not get any more pain medicine from anybody else though because it will delay it further.

## 2023-05-16 ENCOUNTER — Ambulatory Visit (INDEPENDENT_AMBULATORY_CARE_PROVIDER_SITE_OTHER): Payer: MEDICAID | Admitting: Physician Assistant

## 2023-05-16 DIAGNOSIS — Z96651 Presence of right artificial knee joint: Secondary | ICD-10-CM

## 2023-05-16 NOTE — Progress Notes (Signed)
 HPI: Mrs. Cernosek returns today follow-up status post right total knee arthroplasty 03/06/2023.  She feels that she is not making significant progress with physical therapy.  She unfortunately was hospitalized and underwent a cholecystectomy and is definitely affected her rehab in regards to the knee.  Notes she has constant pain in the knee and limps.  No fevers or chills.  She has been receiving hydrocodone  from her primary care physician and was last given 120 tablets on 04/21/2023 and also received 30 oxycodone  tablets from Dr. Lucienne Ryder on 05/07/2023.  She is requesting refills on her pain medicine today.   Review of systems see: HPI otherwise negative  Physical exam: General Well-developed well-nourished female ambulates without any assistive device slight antalgic gait. Psych: Alert and oriented x 3 Right knee: Full extension flexion 105 degrees actively.  No instability valgus varus stressing no abnormal warmth erythema.  Calf supple nontender.  Keloid formation but no signs of infection right knee.  Impression: Status post right total knee arthroplasty  Plan: Recommend she continue work on range of motion strengthening.  Encouraged patient told her that she is definitely progressing with her range of motion.  We will not refill the oxycodone .  She will continue to get her pain medications through her primary care physician.  Questions were encouraged and answered at length.  Will see her back in 1 month sooner if there is any questions concerns.

## 2023-05-16 NOTE — Telephone Encounter (Signed)
 Attempted to call patient per Dr. Andree Kayser request to inform her the information provided by Dr. Andree Kayser about her medication.  Left a voicemail to call office back.  If patient calls back, please inform her that Dr. Andree Kayser stated the medication that was suppose to be filled on the 14th of May will probably not be able to get filled due to Dr. Lucienne Ryder filled another medication on the 28th. The hydrocodone  will possible be delayed for a week, so possibly the 21st. She shouldn't ger any more pain medication from anybody else because it will delay it further.  Christ Courier, CMA

## 2023-05-17 NOTE — Telephone Encounter (Signed)
 Patient returns call to nurse line regarding hydrocodone  prescription.   Advised of message per PCP. Patient states that Dr. Lucienne Ryder is not going to be prescribing her anymore pain medication, with the last rx being from 05/07/23.   She reports that she continues to be in constant pain, even with taking hydrocodone  prescribed by Dr. Andree Kayser. She reports that she has 15 pills left. States that only relief is when she takes oxycodone  and hydrocodone -acet. She reports that she has been to every pain clinic in [place] and is "turned away for one reason or another."  She states that "no one is hearing my pain."  Advised that I would forward message to Dr. Andree Kayser for updated timeframe for when patient should be able to pick up hydrocodone  prescription.   Patient also reports trying to go to the pharmacy for lidocaine  patch or voltaren  gel. She states that they would not sell this to her due to "some contraindication." Advised that I would reach out to PCP for follow up on this matter. If appropriate, patient is requesting rx to be sent to pharmacy for either patch or gel to help with pain.   Elsie Halo, RN

## 2023-05-18 ENCOUNTER — Telehealth: Payer: Self-pay
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
## 2023-05-20 ENCOUNTER — Telehealth: Payer: Self-pay | Admitting: Family Medicine

## 2023-05-20 NOTE — Telephone Encounter (Signed)
**  After Hours/ Emergency Line Call**  Received a page to call [phone]) - [phone].  Patient: Holly Mendez  Caller: Self  Confirmed name & DOB of patient with caller  Subjective:  Patient reports she recently had a cholecystectomy and was hospitalized at [facility] in mid April.  She has since followed up with Dr. Andree Kayser in our clinic office.  She has now had 3 days of frequent watery diarrhea without blood.  No fevers or chills.  Mild abdominal pain with bowel movements.  Stating that she is having diarrhea nearly every hour.  She is having nausea but has not had any vomiting.  She is drinking liquids.  Objective:  Observations: NAD comfortably carrying conversation  Assessment & Plan  Holly Mendez is a 48 y.o. female with PMHx s/f recent cholecystectomy and acute pancreatitis who calls with the following complaints and concerns:   Diarrhea C. difficile versus viral gastroenteritis versus osmotic diarrhea.  Recommendations:  Discussed with patient options of going to emergency department now versus an appointment tomorrow morning.  Patient would prefer not to go to the ED.  Explained that as she is currently able to p.o. liquids and staying hydrated this is an appropriate course of action. Recommended if her ability stay hydrated changes to please go to the ED.  Scheduled for tomorrow morning at 9:50 AM in our clinic. Recommended oral rehydration therapy as well.  -- Red flags discussed.   -- Will forward to PCP.  Holly Marrow, MD, PGY-2 [facility] 11:36 AM 05/20/2023

## 2023-05-21 ENCOUNTER — Ambulatory Visit: Payer: Self-pay

## 2023-05-21 MED ORDER — HYDROCODONE-ACETAMINOPHEN 5-325 MG PO TABS: ORAL_TABLET | ORAL | 0 refills | Status: DC

## 2023-05-21 NOTE — Telephone Encounter (Signed)
 I have sent a rx to be filled on the 19th AND, I will talk with her when I am in on Wednesday as I am tapering her OFF this medicine. I can be the one to break that news to her but if she calls back she can fill a rx on the 19th THANKS! Violetta Grice

## 2023-05-21 NOTE — Progress Notes (Deleted)
    SUBJECTIVE:   CHIEF COMPLAINT / HPI: diarrhea  ***  PERTINENT  PMH / PSH: Cholecystectomy, HTN   OBJECTIVE:   LMP 08/31/2018   ***  ASSESSMENT/PLAN:   Assessment & Plan  No follow-ups on file.  Ivin Marrow, MD [facility]

## 2023-05-21 NOTE — Addendum Note (Signed)
 Addended byVioletta Grice L on: 05/21/2023 03:09 PM   Modules accepted: Orders

## 2023-05-21 NOTE — Telephone Encounter (Signed)
 Patient returns call to nurse line regarding hydrocodone  prescription.   She reports that she understands that she will need to wait additional time to pick up prescription, due to receiving oxycodone  from Dr. Lucienne Ryder. However, she is asking when she will be able to pick this up. She also reports that pharmacy does not have a prescription as of right now.   She also reports using Voltaren  gel from Mr. Oettinger that seems to "somewhat help" with her pain. She is requesting prescription for this as well.   Will forward to PCP.   Elsie Halo, RN

## 2023-05-27 ENCOUNTER — Other Ambulatory Visit: Payer: Self-pay

## 2023-05-27 ENCOUNTER — Encounter (HOSPITAL_COMMUNITY): Payer: Self-pay | Admitting: Emergency Medicine

## 2023-05-27 ENCOUNTER — Inpatient Hospital Stay (HOSPITAL_COMMUNITY)
Admission: EM | Admit: 2023-05-27 | Discharge: 2023-05-30 | DRG: 440 | Disposition: A | Discharge status: Home / Self Care | Payer: MEDICAID | Attending: Internal Medicine | Admitting: Internal Medicine

## 2023-05-27 ENCOUNTER — Emergency Department (HOSPITAL_COMMUNITY): Payer: MEDICAID

## 2023-05-27 DIAGNOSIS — R112 Nausea with vomiting, unspecified: Secondary | ICD-10-CM | POA: Diagnosis present

## 2023-05-27 DIAGNOSIS — K8681 Exocrine pancreatic insufficiency: Secondary | ICD-10-CM | POA: Diagnosis present

## 2023-05-27 DIAGNOSIS — J45909 Unspecified asthma, uncomplicated: Secondary | ICD-10-CM | POA: Diagnosis present

## 2023-05-27 DIAGNOSIS — K3184 Gastroparesis: Secondary | ICD-10-CM | POA: Diagnosis present

## 2023-05-27 DIAGNOSIS — Z9049 Acquired absence of other specified parts of digestive tract: Secondary | ICD-10-CM | POA: Diagnosis not present

## 2023-05-27 DIAGNOSIS — M797 Fibromyalgia: Secondary | ICD-10-CM | POA: Diagnosis present

## 2023-05-27 DIAGNOSIS — E66812 Obesity, class 2: Secondary | ICD-10-CM

## 2023-05-27 DIAGNOSIS — K2100 Gastro-esophageal reflux disease with esophagitis, without bleeding: Secondary | ICD-10-CM

## 2023-05-27 DIAGNOSIS — R1310 Dysphagia, unspecified: Secondary | ICD-10-CM | POA: Diagnosis present

## 2023-05-27 DIAGNOSIS — Z9851 Tubal ligation status: Secondary | ICD-10-CM

## 2023-05-27 DIAGNOSIS — F17210 Nicotine dependence, cigarettes, uncomplicated: Secondary | ICD-10-CM | POA: Diagnosis present

## 2023-05-27 DIAGNOSIS — Z7951 Long term (current) use of inhaled steroids: Secondary | ICD-10-CM

## 2023-05-27 DIAGNOSIS — Z604 Social exclusion and rejection: Secondary | ICD-10-CM | POA: Diagnosis present

## 2023-05-27 DIAGNOSIS — Z90710 Acquired absence of both cervix and uterus: Secondary | ICD-10-CM

## 2023-05-27 DIAGNOSIS — Z6836 Body mass index (BMI) 36.0-36.9, adult: Secondary | ICD-10-CM

## 2023-05-27 DIAGNOSIS — R109 Unspecified abdominal pain: Secondary | ICD-10-CM | POA: Diagnosis not present

## 2023-05-27 DIAGNOSIS — H5462 Unqualified visual loss, left eye, normal vision right eye: Secondary | ICD-10-CM | POA: Diagnosis present

## 2023-05-27 DIAGNOSIS — Z79899 Other long term (current) drug therapy: Secondary | ICD-10-CM

## 2023-05-27 DIAGNOSIS — Z79891 Long term (current) use of opiate analgesic: Secondary | ICD-10-CM

## 2023-05-27 DIAGNOSIS — K591 Functional diarrhea: Secondary | ICD-10-CM | POA: Diagnosis present

## 2023-05-27 DIAGNOSIS — F319 Bipolar disorder, unspecified: Secondary | ICD-10-CM | POA: Diagnosis present

## 2023-05-27 DIAGNOSIS — E7800 Pure hypercholesterolemia, unspecified: Secondary | ICD-10-CM | POA: Diagnosis present

## 2023-05-27 DIAGNOSIS — Z888 Allergy status to other drugs, medicaments and biological substances status: Secondary | ICD-10-CM

## 2023-05-27 DIAGNOSIS — K219 Gastro-esophageal reflux disease without esophagitis: Secondary | ICD-10-CM | POA: Diagnosis present

## 2023-05-27 DIAGNOSIS — K209 Esophagitis, unspecified without bleeding: Secondary | ICD-10-CM | POA: Diagnosis not present

## 2023-05-27 DIAGNOSIS — K861 Other chronic pancreatitis: Secondary | ICD-10-CM | POA: Diagnosis present

## 2023-05-27 DIAGNOSIS — I10 Essential (primary) hypertension: Secondary | ICD-10-CM | POA: Diagnosis present

## 2023-05-27 DIAGNOSIS — Z970 Presence of artificial eye: Secondary | ICD-10-CM

## 2023-05-27 DIAGNOSIS — Z5941 Food insecurity: Secondary | ICD-10-CM

## 2023-05-27 DIAGNOSIS — Z8661 Personal history of infections of the central nervous system: Secondary | ICD-10-CM

## 2023-05-27 DIAGNOSIS — K222 Esophageal obstruction: Secondary | ICD-10-CM | POA: Diagnosis not present

## 2023-05-27 DIAGNOSIS — K449 Diaphragmatic hernia without obstruction or gangrene: Secondary | ICD-10-CM | POA: Diagnosis present

## 2023-05-27 DIAGNOSIS — Z96651 Presence of right artificial knee joint: Secondary | ICD-10-CM | POA: Diagnosis present

## 2023-05-27 DIAGNOSIS — E876 Hypokalemia: Secondary | ICD-10-CM | POA: Diagnosis present

## 2023-05-27 DIAGNOSIS — R6881 Early satiety: Secondary | ICD-10-CM | POA: Diagnosis present

## 2023-05-27 DIAGNOSIS — Z5982 Transportation insecurity: Secondary | ICD-10-CM

## 2023-05-27 DIAGNOSIS — K2289 Other specified disease of esophagus: Secondary | ICD-10-CM | POA: Diagnosis not present

## 2023-05-27 DIAGNOSIS — Z881 Allergy status to other antibiotic agents status: Secondary | ICD-10-CM

## 2023-05-27 LAB — URINALYSIS, ROUTINE W REFLEX MICROSCOPIC
Bilirubin Urine: NEGATIVE
Glucose, UA: NEGATIVE mg/dL
Hgb urine dipstick: NEGATIVE
Ketones, ur: 5 mg/dL — AB
Leukocytes,Ua: NEGATIVE
Nitrite: NEGATIVE
Protein, ur: NEGATIVE mg/dL
Specific Gravity, Urine: 1.031 — ABNORMAL HIGH (ref 1.005–1.030)
pH: 7 (ref 5.0–8.0)

## 2023-05-27 LAB — COMPREHENSIVE METABOLIC PANEL WITH GFR
ALT: 13 U/L (ref 0–44)
AST: 16 U/L (ref 15–41)
Albumin: 3.8 g/dL (ref 3.5–5.0)
Alkaline Phosphatase: 75 U/L (ref 38–126)
Anion gap: 8 (ref 5–15)
BUN: 6 mg/dL (ref 6–20)
CO2: 23 mmol/L (ref 22–32)
Calcium: 9.1 mg/dL (ref 8.9–10.3)
Chloride: 103 mmol/L (ref 98–111)
Creatinine, Ser: 0.81 mg/dL (ref 0.44–1.00)
GFR, Estimated: >60 mL/min (ref >60)
Glucose, Bld: 117 mg/dL — ABNORMAL HIGH (ref 70–99)
Potassium: 3.1 mmol/L — ABNORMAL LOW (ref 3.5–5.1)
Sodium: 134 mmol/L — ABNORMAL LOW (ref 135–145)
Total Bilirubin: 0.5 mg/dL (ref 0.0–1.2)
Total Protein: 7.9 g/dL (ref 6.5–8.1)

## 2023-05-27 LAB — CBC WITH DIFFERENTIAL/PLATELET
Abs Immature Granulocytes: 0.03 10*3/uL (ref 0.00–0.07)
Basophils Absolute: 0 10*3/uL (ref 0.0–0.1)
Basophils Relative: 0 %
Eosinophils Absolute: 0 10*3/uL (ref 0.0–0.5)
Eosinophils Relative: 0 %
HCT: 37.3 % (ref 36.0–46.0)
Hemoglobin: 12.2 g/dL (ref 12.0–15.0)
Immature Granulocytes: 0 %
Lymphocytes Relative: 39 %
Lymphs Abs: 3 10*3/uL (ref 0.7–4.0)
MCH: 27.9 pg (ref 26.0–34.0)
MCHC: 32.7 g/dL (ref 30.0–36.0)
MCV: 85.2 fL (ref 80.0–100.0)
Monocytes Absolute: 0.4 10*3/uL (ref 0.1–1.0)
Monocytes Relative: 6 %
Neutro Abs: 4.2 10*3/uL (ref 1.7–7.7)
Neutrophils Relative %: 55 %
Platelets: 215 10*3/uL (ref 150–400)
RBC: 4.38 MIL/uL (ref 3.87–5.11)
RDW: 14.5 % (ref 11.5–15.5)
WBC: 7.6 10*3/uL (ref 4.0–10.5)
nRBC: 0 % (ref 0.0–0.2)

## 2023-05-27 LAB — LIPASE, BLOOD: Lipase: 27 U/L (ref 11–51)

## 2023-05-27 MED ORDER — PROMETHAZINE HCL 25 MG/ML IJ SOLN
INTRAMUSCULAR | Status: AC
  Filled 2023-05-27: qty 1

## 2023-05-27 MED ORDER — IOHEXOL 300 MG/ML  SOLN
100.0000 mL | Freq: Once | INTRAMUSCULAR | Status: AC | PRN
  Administered 2023-05-27: 100 mL via INTRAVENOUS

## 2023-05-27 MED ORDER — ONDANSETRON HCL 4 MG/2ML IJ SOLN
4.0000 mg | Freq: Once | INTRAMUSCULAR | Status: AC
  Administered 2023-05-27: 4 mg via INTRAVENOUS
  Filled 2023-05-27: qty 2

## 2023-05-27 MED ORDER — POTASSIUM CHLORIDE 10 MEQ/100ML IV SOLN
10.0000 meq | Freq: Once | INTRAVENOUS | Status: AC
  Administered 2023-05-27: 10 meq via INTRAVENOUS
  Filled 2023-05-27: qty 100

## 2023-05-27 MED ORDER — SODIUM CHLORIDE 0.9 % IV SOLN
25.0000 mg | Freq: Once | INTRAVENOUS | Status: AC
  Administered 2023-05-27: 25 mg via INTRAVENOUS
  Filled 2023-05-27: qty 1

## 2023-05-27 MED ORDER — HYDROMORPHONE HCL 1 MG/ML IJ SOLN
0.5000 mg | Freq: Once | INTRAMUSCULAR | Status: AC
  Administered 2023-05-27: 0.5 mg via INTRAVENOUS
  Filled 2023-05-27: qty 0.5

## 2023-05-27 MED ORDER — DIAZEPAM 5 MG/ML IJ SOLN
5.0000 mg | Freq: Once | INTRAMUSCULAR | Status: AC
  Administered 2023-05-27: 5 mg via INTRAVENOUS
  Filled 2023-05-27: qty 2

## 2023-05-27 MED ORDER — LACTATED RINGERS IV BOLUS
1000.0000 mL | Freq: Once | INTRAVENOUS | Status: AC
Start: 2023-05-27 — End: 2023-05-27
  Administered 2023-05-27: 1000 mL via INTRAVENOUS

## 2023-05-27 MED ORDER — MORPHINE SULFATE (PF) 4 MG/ML IV SOLN
4.0000 mg | Freq: Once | INTRAVENOUS | Status: AC
  Administered 2023-05-27: 4 mg via INTRAVENOUS
  Filled 2023-05-27: qty 1

## 2023-05-27 MED ORDER — DROPERIDOL 2.5 MG/ML IJ SOLN
1.2500 mg | Freq: Once | INTRAMUSCULAR | Status: AC
  Administered 2023-05-27: 1.25 mg via INTRAVENOUS
  Filled 2023-05-27: qty 2

## 2023-05-27 NOTE — ED Triage Notes (Signed)
 Pt c/o abd pain with n/v.
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
## 2023-05-27 NOTE — ED Provider Notes (Signed)
 [facility] AT [facility] Provider Note   CSN: [id number] Arrival date & time: 05/27/23  1549     History  Chief Complaint  Patient presents with   Abdominal Pain    Holly Mendez is a 48 y.o. female.  Patient with history of chronic pancreatitis, fibromyalgia. GERD, hypertension, hyperlipidemia presents today with complaints of abdominal pain, nausea, vomiting. She states that same began 4 days ago and has been persistent since then. Pain is epigastric in nature and does radiate to her lower abdomen as well. States she has been here for this similar pain previously. Denies fevers or chills. Did have a cholecystectomy recently.   The history is provided by the patient. No language interpreter was used.  Abdominal Pain Associated symptoms: nausea and vomiting        Home Medications Prior to Admission medications   Medication Sig Start Date End Date Taking? Authorizing Provider  cetirizine  (ZYRTEC ) 10 MG tablet Take 10 mg by mouth in the morning.    [provider]  cloNIDine  (CATAPRES  - DOSED IN MG/24 HR) 0.1 mg/24hr patch PLACE 1 PATCH ONTO SKIN ONCE WEEKLY. Patient taking differently: Place 0.1 mg onto the skin every Sunday. PLACE 1 PATCH ONTO SKIN ONCE WEEKLY. 06/07/22   Charise Companion, MD  divalproex  (DEPAKOTE ) 500 MG DR tablet Take 500 mg by mouth 2 (two) times daily. 10/05/22   [provider]  esomeprazole  (NEXIUM ) 40 MG capsule Take 1 capsule (40 mg total) by mouth 2 (two) times daily before a meal. 05/07/23   May, Deanna J, NP  fluconazole  (DIFLUCAN ) 200 MG tablet TAKE 1 TABLET BY MOUTH EVERY DAY UNTIL GONE 04/12/23   Charise Companion, MD  gabapentin  (NEURONTIN ) 300 MG capsule Take 600 mg by mouth at bedtime.    [provider]  HYDROcodone -acetaminophen  (NORCO/VICODIN) 5-325 MG tablet Take 1 tablet by mouth every 8 (eight) hours as needed for 7 days for moderate pain (pain score 4-6), THEN 1 tablet 2 (two) times daily  as needed for 7 days for moderate pain (pain score 4-6), THEN 1 tablet daily as needed for up to 14 days for moderate pain (pain score 4-6). 05/21/23 06/18/23  Charise Companion, MD  hydrOXYzine  (ATARAX ) 25 MG tablet Take 25 mg by mouth 3 (three) times daily.    [provider]  NARCAN  4 MG/0.1ML LIQD nasal spray kit Place 1 spray into the nose daily as needed (overdose). 02/15/22   Charise Companion, MD  ondansetron  (ZOFRAN -ODT) 4 MG disintegrating tablet Take 1 tablet (4 mg total) by mouth every 12 (twelve) hours as needed for nausea or vomiting. 05/07/23 06/06/23  May, Deanna J, NP  QUEtiapine  (SEROQUEL ) 400 MG tablet Take 400 mg by mouth at bedtime.    [provider]  SYMBICORT  80-4.5 MCG/ACT inhaler INHALE 2 PUFFS INTO THE LUNGS TWICE DAILY AS NEEDED. Patient taking differently: Inhale 2 puffs into the lungs 2 (two) times daily as needed. 12/27/22   Charise Companion, MD  topiramate  (TOPAMAX ) 100 MG tablet Take 1 tablet (100 mg total) by mouth 2 (two) times daily. By psychiatry Patient taking differently: Take 100 mg by mouth at bedtime. By psychiatry 01/25/23   Charise Companion, MD  VENTOLIN  HFA 108 (90 Base) MCG/ACT inhaler INHALE TWO PUFFS INTO THE LUNGS EVERY 6 HOURS AS NEEDED WHEEZING OR SHORTNESS OF BREATH 12/27/22   Charise Companion, MD      Allergies    Lyrica  Zane Hews ], Zithromax [  azithromycin], Celebrex  [celecoxib ], Nabumetone , Naproxen , and Norvasc  [amlodipine  besylate]    Review of Systems   Review of Systems  Gastrointestinal:  Positive for abdominal pain, nausea and vomiting.  All other systems reviewed and are negative.   Physical Exam Updated Vital Signs BP (!) 151/98   Pulse 79   Temp 98.4 F (36.9 C) (Oral)   Resp 20   Ht 5\' 5"  (1.651 m)   Wt 104 kg   LMP 08/31/2018   SpO2 100%   BMI 38.15 kg/m  Physical Exam Vitals and nursing note reviewed.  Constitutional:      General: She is not in acute distress.    Appearance: Normal appearance. She is normal weight. She is  not ill-appearing, toxic-appearing or diaphoretic.     Comments: Uncomfortable appearing  HENT:     Head: Normocephalic and atraumatic.  Cardiovascular:     Rate and Rhythm: Normal rate.  Pulmonary:     Effort: Pulmonary effort is normal. No respiratory distress.  Abdominal:     General: Abdomen is flat.     Palpations: Abdomen is soft.     Tenderness: There is generalized abdominal tenderness.  Musculoskeletal:        General: Normal range of motion.     Cervical back: Normal range of motion.  Skin:    General: Skin is warm and dry.  Neurological:     General: No focal deficit present.     Mental Status: She is alert.  Psychiatric:        Mood and Affect: Mood normal.        Behavior: Behavior normal.     ED Results / Procedures / Treatments   Labs (all labs ordered are listed, but only abnormal results are displayed) Labs Reviewed  COMPREHENSIVE METABOLIC PANEL WITH GFR - Abnormal; Notable for the following components:      Result Value   Sodium 134 (*)    Potassium 3.1 (*)    Glucose, Bld 117 (*)    All other components within normal limits  LIPASE, BLOOD  CBC WITH DIFFERENTIAL/PLATELET  URINALYSIS, ROUTINE W REFLEX MICROSCOPIC    EKG None  Radiology CT ABDOMEN PELVIS W CONTRAST Result Date: 05/27/2023 CLINICAL DATA:  Generalized abdominal pain EXAM: CT ABDOMEN AND PELVIS WITH CONTRAST TECHNIQUE: Multidetector CT imaging of the abdomen and pelvis was performed using the standard protocol following bolus administration of intravenous contrast. RADIATION DOSE REDUCTION: This exam was performed according to the departmental dose-optimization program which includes automated exposure control, adjustment of the mA and/or kV according to patient size and/or use of iterative reconstruction technique. CONTRAST:  OMNIPAQUE  IOHEXOL  300 MG/ML  SOLN COMPARISON:  04/28/2023 FINDINGS: Lower chest: No acute abnormality Hepatobiliary: No focal liver abnormality is seen. Status  post cholecystectomy. No biliary dilatation. Pancreas: No focal abnormality or ductal dilatation. Spleen: No focal abnormality.  Normal size. Adrenals/Urinary Tract: No adrenal abnormality. No focal renal abnormality. No stones or hydronephrosis. Urinary bladder is unremarkable. Stomach/Bowel: Stomach, large and small bowel grossly unremarkable. Vascular/Lymphatic: No evidence of aneurysm or adenopathy. Reproductive: Prior hysterectomy.  No adnexal masses. Other: No free fluid or free air. Musculoskeletal: No acute bony abnormality. IMPRESSION: No acute findings in the abdomen or pelvis. Electronically Signed   By: Janeece Mechanic M.D.   On: 05/27/2023 18:15    Procedures Procedures    Medications Ordered in ED Medications  ondansetron  (ZOFRAN ) injection 4 mg (4 mg Intravenous Given 05/27/23 1647)  morphine  (PF) 4 MG/ML injection 4 mg (  4 mg Intravenous Given 05/27/23 1647)  lactated ringers  bolus 1,000 mL (0 mLs Intravenous Stopped 05/27/23 1924)  morphine  (PF) 4 MG/ML injection 4 mg (4 mg Intravenous Given 05/27/23 1754)  ondansetron  (ZOFRAN ) injection 4 mg (4 mg Intravenous Given 05/27/23 1753)  iohexol  (OMNIPAQUE ) 300 MG/ML solution 100 mL (100 mLs Intravenous Contrast Given 05/27/23 1721)  promethazine  (PHENERGAN ) 25 mg in sodium chloride  0.9 % 50 mL IVPB (0 mg Intravenous Stopped 05/27/23 2026)  HYDROmorphone  (DILAUDID ) injection 0.5 mg (0.5 mg Intravenous Given 05/27/23 2025)    ED Course/ Medical Decision Making/ A&P                                 Medical Decision Making Amount and/or Complexity of Data Reviewed Labs: ordered. Radiology: ordered.  Risk Prescription drug management.   This patient is a 48 y.o. female who presents to the ED for concern of abdominal pain, nausea, vomiting, this involves an extensive number of treatment options, and is a complaint that carries with it a high risk of complications and morbidity. The emergent differential diagnosis prior to evaluation  includes, but is not limited to,  The differential diagnosis for generalized abdominal pain includes, but is not limited to AAA, gastroenteritis, appendicitis, Bowel obstruction, Bowel perforation. Gastroparesis, DKA, Hernia, Inflammatory bowel disease, mesenteric ischemia, pancreatitis, peritonitis SBP, volvulus.  This is not an exhaustive differential.   Past Medical History / Co-morbidities / Social History:  has a past medical history of Abdominal pain (12/05/2017), Anxiety, Arthritis, Bipolar disorder (HCC), Blind left eye (1980), Chronic lower back pain, Chronic pancreatitis (HCC), Depression, Drug-seeking behavior, Fibroids (06/19/2017), Fibromyalgia, GERD (gastroesophageal reflux disease), History of meningitis (09/2014), History of seasonal allergies, Hypercholesterolemia, Hypertension, Intractable nausea and vomiting (03/23/2023), Pre-diabetes, Reactive airway disease (11/21/2013), Schizoaffective disorder, SVD (spontaneous vaginal delivery), and Vitamin D  deficiency.  Additional history: Chart reviewed. Pertinent results include: admitted 3/13-3/18 for hypokalemia, intractable pain, nausea/vomiting. Admitted for cholecystitis on 4/10-4/12, had cholecystectomy on 4/11. Admitted again on 4/19-4/21 for pancreatitis.  Physical Exam: Physical exam performed. The pertinent findings include: generally uncomfortable appearing, generalized abdominal TTP. Vomiting on exam  Lab Tests: I ordered, and personally interpreted labs.  The pertinent results include:  Na 134, K 3.1. UA with ketones, noninfectious.   Imaging Studies: I ordered imaging studies including CT abdomen pelvis. I independently visualized and interpreted imaging which showed no acute findings. I agree with the radiologist interpretation.   Medications: I ordered medication including morphine , dilaudid , fluids, potassium, zofran , phenergan , droperidol , valium   for pain, dehydration, hypokalemia, nausea/vomiting. Reevaluation of the  patient after these medicines showed that the patient stayed the same. I have reviewed the patients home medicines and have made adjustments as needed.   Disposition: After consideration of the diagnostic results and the patients response to treatment, I feel that patient will require admission for intractable abdominal pain, nausea, vomiting. Upon reassessment, she is still uncomfortable appearing and vomiting is not controlled despite multiple interventions. She is requesting admission.   Discussed patient with hospitalist Dr. Adefeso who accepts patient for admission.  I discussed this case with my attending physician Dr. Aldean Amass who cosigned this note including patient's presenting symptoms, physical exam, and planned diagnostics and interventions. Attending physician stated agreement with plan or made changes to plan which were implemented.    Final Clinical Impression(s) / ED Diagnoses Final diagnoses:  Intractable abdominal pain  Intractable nausea and vomiting  Hypokalemia    Rx /  DC Orders ED Discharge Orders     None         Fredna Jasper 05/27/23 2258    Jerilynn Montenegro, MD 05/30/23 1228

## 2023-05-28 ENCOUNTER — Telehealth: Payer: Self-pay

## 2023-05-28 ENCOUNTER — Telehealth: Payer: Self-pay | Admitting: Family Medicine

## 2023-05-28 DIAGNOSIS — I10 Essential (primary) hypertension: Secondary | ICD-10-CM | POA: Diagnosis not present

## 2023-05-28 DIAGNOSIS — K2100 Gastro-esophageal reflux disease with esophagitis, without bleeding: Secondary | ICD-10-CM

## 2023-05-28 DIAGNOSIS — R112 Nausea with vomiting, unspecified: Secondary | ICD-10-CM | POA: Diagnosis not present

## 2023-05-28 DIAGNOSIS — R109 Unspecified abdominal pain: Secondary | ICD-10-CM | POA: Diagnosis not present

## 2023-05-28 DIAGNOSIS — K209 Esophagitis, unspecified without bleeding: Secondary | ICD-10-CM | POA: Diagnosis not present

## 2023-05-28 LAB — RAPID URINE DRUG SCREEN, HOSP PERFORMED
Amphetamines: NOT DETECTED
Barbiturates: NOT DETECTED
Benzodiazepines: NOT DETECTED
Cocaine: NOT DETECTED
Opiates: POSITIVE — AB
Tetrahydrocannabinol: NOT DETECTED

## 2023-05-28 LAB — MAGNESIUM: Magnesium: 2 mg/dL (ref 1.7–2.4)

## 2023-05-28 LAB — COMPREHENSIVE METABOLIC PANEL WITH GFR
ALT: 13 U/L (ref 0–44)
AST: 15 U/L (ref 15–41)
Albumin: 4 g/dL (ref 3.5–5.0)
Alkaline Phosphatase: 79 U/L (ref 38–126)
Anion gap: 10 (ref 5–15)
BUN: 5 mg/dL — ABNORMAL LOW (ref 6–20)
CO2: 23 mmol/L (ref 22–32)
Calcium: 9 mg/dL (ref 8.9–10.3)
Chloride: 99 mmol/L (ref 98–111)
Creatinine, Ser: 0.73 mg/dL (ref 0.44–1.00)
GFR, Estimated: >60 mL/min (ref >60)
Glucose, Bld: 191 mg/dL — ABNORMAL HIGH (ref 70–99)
Potassium: 3.7 mmol/L (ref 3.5–5.1)
Sodium: 132 mmol/L — ABNORMAL LOW (ref 135–145)
Total Bilirubin: 0.5 mg/dL (ref 0.0–1.2)
Total Protein: 8.2 g/dL — ABNORMAL HIGH (ref 6.5–8.1)

## 2023-05-28 LAB — CBC
HCT: 38.9 % (ref 36.0–46.0)
Hemoglobin: 12.6 g/dL (ref 12.0–15.0)
MCH: 27.5 pg (ref 26.0–34.0)
MCHC: 32.4 g/dL (ref 30.0–36.0)
MCV: 84.7 fL (ref 80.0–100.0)
Platelets: 297 10*3/uL (ref 150–400)
RBC: 4.59 MIL/uL (ref 3.87–5.11)
RDW: 14.4 % (ref 11.5–15.5)
WBC: 5.4 10*3/uL (ref 4.0–10.5)
nRBC: 0 % (ref 0.0–0.2)

## 2023-05-28 LAB — PHOSPHORUS: Phosphorus: 3.4 mg/dL (ref 2.5–4.6)

## 2023-05-28 LAB — LIPASE, BLOOD: Lipase: 33 U/L (ref 11–51)

## 2023-05-28 LAB — HEMOGLOBIN A1C
Hgb A1c MFr Bld: 5.4 % (ref 4.8–5.6)
Mean Plasma Glucose: 108.28 mg/dL

## 2023-05-28 MED ORDER — ACETAMINOPHEN 325 MG PO TABS
650.0000 mg | ORAL_TABLET | Freq: Four times a day (QID) | ORAL | Status: DC | PRN
  Administered 2023-05-29: 650 mg via ORAL
  Filled 2023-05-28: qty 2

## 2023-05-28 MED ORDER — ONDANSETRON HCL 4 MG PO TABS
4.0000 mg | ORAL_TABLET | Freq: Four times a day (QID) | ORAL | Status: DC | PRN
  Administered 2023-05-29: 4 mg via ORAL
  Filled 2023-05-28: qty 1

## 2023-05-28 MED ORDER — POTASSIUM CHLORIDE 10 MEQ/100ML IV SOLN
10.0000 meq | INTRAVENOUS | Status: AC
  Administered 2023-05-28 (×2): 10 meq via INTRAVENOUS
  Filled 2023-05-28 (×2): qty 100

## 2023-05-28 MED ORDER — PROCHLORPERAZINE 25 MG RE SUPP
25.0000 mg | Freq: Once | RECTAL | Status: AC
  Administered 2023-05-28: 25 mg via RECTAL
  Filled 2023-05-28: qty 1

## 2023-05-28 MED ORDER — ONDANSETRON HCL 4 MG/2ML IJ SOLN
4.0000 mg | Freq: Four times a day (QID) | INTRAMUSCULAR | Status: DC
Start: 2023-05-28 — End: 2023-05-31
  Administered 2023-05-28 – 2023-05-30 (×8): 4 mg via INTRAVENOUS
  Filled 2023-05-28 (×9): qty 2

## 2023-05-28 MED ORDER — CHOLESTYRAMINE LIGHT 4 G PO PACK
4.0000 g | PACK | Freq: Three times a day (TID) | ORAL | Status: DC
  Administered 2023-05-30: 4 g via ORAL
  Filled 2023-05-28 (×8): qty 1

## 2023-05-28 MED ORDER — ACETAMINOPHEN 650 MG RE SUPP: 650.0000 mg | Freq: Four times a day (QID) | RECTAL | Status: DC | PRN

## 2023-05-28 MED ORDER — ONDANSETRON HCL 4 MG/2ML IJ SOLN
4.0000 mg | Freq: Four times a day (QID) | INTRAMUSCULAR | Status: DC | PRN
  Administered 2023-05-28 – 2023-05-30 (×4): 4 mg via INTRAVENOUS
  Filled 2023-05-28 (×4): qty 2

## 2023-05-28 MED ORDER — ENOXAPARIN SODIUM 40 MG/0.4ML IJ SOSY
40.0000 mg | PREFILLED_SYRINGE | INTRAMUSCULAR | Status: DC
  Administered 2023-05-28 – 2023-05-30 (×3): 40 mg via SUBCUTANEOUS
  Filled 2023-05-28 (×3): qty 0.4

## 2023-05-28 MED ORDER — METOCLOPRAMIDE HCL 5 MG/ML IJ SOLN
5.0000 mg | Freq: Four times a day (QID) | INTRAMUSCULAR | Status: DC
  Administered 2023-05-28 – 2023-05-29 (×4): 5 mg via INTRAVENOUS
  Filled 2023-05-28 (×5): qty 2

## 2023-05-28 MED ORDER — PANTOPRAZOLE SODIUM 40 MG IV SOLR
40.0000 mg | Freq: Two times a day (BID) | INTRAVENOUS | Status: DC
  Administered 2023-05-28 – 2023-05-30 (×6): 40 mg via INTRAVENOUS
  Filled 2023-05-28 (×6): qty 10

## 2023-05-28 MED ORDER — CLONIDINE HCL 0.1 MG/24HR TD PTWK: 0.1000 mg | MEDICATED_PATCH | TRANSDERMAL | Status: DC

## 2023-05-28 MED ORDER — HYDROMORPHONE HCL 1 MG/ML IJ SOLN
0.5000 mg | Freq: Four times a day (QID) | INTRAMUSCULAR | Status: DC | PRN
  Administered 2023-05-28 – 2023-05-29 (×2): 0.5 mg via INTRAVENOUS
  Filled 2023-05-28 (×2): qty 0.5

## 2023-05-28 MED ORDER — HYDROMORPHONE HCL 1 MG/ML IJ SOLN
0.5000 mg | INTRAMUSCULAR | Status: DC | PRN
  Administered 2023-05-28 (×5): 0.5 mg via INTRAVENOUS
  Filled 2023-05-28 (×5): qty 0.5
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
## 2023-05-28 NOTE — Progress Notes (Signed)
 Patient has had multiple episodes of emesis throughout the night. She has received Dilaudid  twice for pain and Zofran  once.

## 2023-05-28 NOTE — Telephone Encounter (Signed)
 Patient calls nurse line requesting to speak with Dr. Andree Kayser.   Patient reports she picked up her pain medication today and reports she was "shocked" to find only #[phone] were called in.   She reports she is currently admitted at [facility].  She would like to discuss the cut back with Dr. Andree Kayser.   Advised will forward to PCP.
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
## 2023-05-28 NOTE — Progress Notes (Signed)
 PROGRESS NOTE   Holly Mendez, is a 48 y.o. female, DOB - 21-Jan-1975, ZOX:[id number]  Admit date - 05/27/2023   Admitting Physician Twilla Galea, DO  Outpatient Primary MD for the patient is Charise Companion, MD  LOS - 1  Chief Complaint  Patient presents with   Abdominal Pain      Brief Narrative:  48 y.o. female with medical history significant of hypertension, GERD, chronic pancreatitis admitted with intractable emesis on 05/27/2023 in the setting of chronic opioid use    -Assessment and Plan: 1) intractable emesis-denies illicit drug use -CT abdomen and pelvis without acute findings, specifically her pancreas looks okay on the CT abdomen and pelvis from 05/27/2023 -The pancreas also looked unremarkable on MRCP from 04/30/2023 -A1c 5.4 - Suspect related to chronic prescription opiate use - GI consult appreciated - Antiemetics adjusted - Be judicious with opiates - Her PCP Dr. Andree Kayser plans to wean off narcotics over the near future  2) chronic opiate use--- her PCP Dr. Andree Kayser will work with her to taper off opiates due to side effects including vomiting and abdominal pain associated with chronic opiate use  3)Hypokalemia--due to persistent emesis - Replace and recheck  4)GERD--continue Protonix  especially in view of recurrent emesis  5)HTN--continue clonidine  patch  6)Class Obesity- -patient states she has been losing weight steadily since she underwent laparoscopic cholecystectomy in April 2025---she now has postcholecystectomy postprandial diarrhea -if Questran  -Low calorie diet, portion control and increase physical activity discussed with patient -Body mass index is 36.43 kg/m.  Status is: Inpatient   Disposition: The patient is from: Home              Anticipated d/c is to: Home              Anticipated d/c date is: 1 day              Patient currently is not medically stable to d/c. Barriers: Not Clinically Stable-   Code Status :  -  Code Status: Full Code    Family Communication:    NA (patient is alert, awake and coherent)   DVT Prophylaxis  :   - SCDs   enoxaparin  (LOVENOX ) injection 40 mg Start: 05/28/23 1000 SCDs Start: 05/28/23 0102   Lab Results  Component Value Date   PLT 297 05/28/2023    Inpatient Medications  Scheduled Meds:  [START ON 06/03/2023] cloNIDine   0.1 mg Transdermal Q Sun   enoxaparin  (LOVENOX ) injection  40 mg Subcutaneous Q24H   metoCLOPramide  (REGLAN ) injection  5 mg Intravenous Q6H   ondansetron  (ZOFRAN ) IV  4 mg Intravenous Q6H   pantoprazole  (PROTONIX ) IV  40 mg Intravenous Q12H   Continuous Infusions: PRN Meds:.acetaminophen  **OR** acetaminophen , HYDROmorphone  (DILAUDID ) injection, ondansetron  **OR** ondansetron  (ZOFRAN ) IV   Anti-infectives (From admission, onward)    None         Subjective: Holly Mendez today has no fevers,   No chest pain,   - Nausea and vomiting persist -Abdominal pain is not worse  Objective: Vitals:   05/27/23 2336 05/27/23 2342 05/28/23 0319 05/28/23 1515  BP:  (!) 146/88 (!) 158/98 115/72  Pulse:  63 74 84  Resp:  19 20 19   Temp:  98.7 F (37.1 C) 98.3 F (36.8 C) 98.3 F (36.8 C)  TempSrc:  Oral Oral   SpO2:  100% 92% 98%  Weight: 99.3 kg     Height: 5\' 5"  (1.651 m)       Intake/Output Summary (Last 24 hours)  at 05/28/2023 1929 Last data filed at 05/28/2023 0355 Gross per 24 hour  Intake 248.98 ml  Output --  Net 248.98 ml   Filed Weights   05/27/23 1553 05/27/23 2336  Weight: 104 kg 99.3 kg    Physical Exam  Gen:- Awake Alert, no acute distress HEENT:- [place].AT, No sclera icterus, artificial left eye Neck-Supple Neck,No JVD,.  Lungs-  CTAB , fair symmetrical air movement CV- S1, S2 normal, regular  Abd-  +ve B.Sounds, Abd Soft, epigastric tenderness without rebound or guarding Extremity/Skin:- No  edema, pedal pulses present  Psych-affect is appropriate, oriented x3 Neuro-no new focal deficits, no tremors  Data Reviewed: I have  personally reviewed following labs and imaging studies  CBC: Recent Labs  Lab 05/27/23 1619 05/28/23 0535  WBC 7.6 5.4  NEUTROABS 4.2  --   HGB 12.2 12.6  HCT 37.3 38.9  MCV 85.2 84.7  PLT 215 297   Basic Metabolic Panel: Recent Labs  Lab 05/27/23 1619 05/28/23 0535  NA 134* 132*  K 3.1* 3.7  CL 103 99  CO2 23 23  GLUCOSE 117* 191*  BUN 6 5*  CREATININE 0.81 0.73  CALCIUM  9.1 9.0  MG  --  2.0  PHOS  --  3.4   GFR: Estimated Creatinine Clearance: 99.2 mL/min (by C-G formula based on SCr of 0.73 mg/dL). Liver Function Tests: Recent Labs  Lab 05/27/23 1619 05/28/23 0535  AST 16 15  ALT 13 13  ALKPHOS 75 79  BILITOT 0.5 0.5  PROT 7.9 8.2*  ALBUMIN 3.8 4.0   HbA1C: Recent Labs    05/28/23 1058  HGBA1C 5.4   Radiology Studies: CT ABDOMEN PELVIS W CONTRAST Result Date: 05/27/2023 CLINICAL DATA:  Generalized abdominal pain EXAM: CT ABDOMEN AND PELVIS WITH CONTRAST TECHNIQUE: Multidetector CT imaging of the abdomen and pelvis was performed using the standard protocol following bolus administration of intravenous contrast. RADIATION DOSE REDUCTION: This exam was performed according to the departmental dose-optimization program which includes automated exposure control, adjustment of the mA and/or kV according to patient size and/or use of iterative reconstruction technique. CONTRAST:  OMNIPAQUE  IOHEXOL  300 MG/ML  SOLN COMPARISON:  04/28/2023 FINDINGS: Lower chest: No acute abnormality Hepatobiliary: No focal liver abnormality is seen. Status post cholecystectomy. No biliary dilatation. Pancreas: No focal abnormality or ductal dilatation. Spleen: No focal abnormality.  Normal size. Adrenals/Urinary Tract: No adrenal abnormality. No focal renal abnormality. No stones or hydronephrosis. Urinary bladder is unremarkable. Stomach/Bowel: Stomach, large and small bowel grossly unremarkable. Vascular/Lymphatic: No evidence of aneurysm or adenopathy. Reproductive: Prior  hysterectomy.  No adnexal masses. Other: No free fluid or free air. Musculoskeletal: No acute bony abnormality. IMPRESSION: No acute findings in the abdomen or pelvis. Electronically Signed   By: Janeece Mechanic M.D.   On: 05/27/2023 18:15   Scheduled Meds:  [START ON 06/03/2023] cloNIDine   0.1 mg Transdermal Q Sun   enoxaparin  (LOVENOX ) injection  40 mg Subcutaneous Q24H   metoCLOPramide  (REGLAN ) injection  5 mg Intravenous Q6H   ondansetron  (ZOFRAN ) IV  4 mg Intravenous Q6H   pantoprazole  (PROTONIX ) IV  40 mg Intravenous Q12H   Continuous Infusions:   LOS: 1 day    Colin Dawley M.D on 05/28/2023 at 7:29 PM  Go to [email or web address] - for contact info  [facility] - Office  [phone]  If 7PM-7AM, please contact night-coverage [email or web address] 05/28/2023, 7:29 PM

## 2023-05-28 NOTE — Telephone Encounter (Signed)
 Spoke with Holly Mendez.  She was in the hospital admitted to the emergency department for abdominal pain and intractable nausea.  I apologize for not getting hold of her before she went to pick up her pain medicine prescription.  We discussed tapering her off that and she is fine with that.  I will complete this taper in the next month I will give her 1 more month of 1/day.  We discussed reasons this is causing her continued problems with abdominal pain etc.  She agrees and she will follow-up with me after she is discharged from the hospital from her current admission.

## 2023-05-28 NOTE — TOC Initial Note (Signed)
 Transition of Care [facility] - Initial/Assessment Note    Patient Details  Name: Holly Mendez MRN: [id number] Date of Birth: 06/06/1975  Transition of Care [facility] CM/SW Contact:    Ander Katos, LCSW Phone Number: 05/28/2023, 9:04 AM  Clinical Narrative:   Pt admitted with intractable abdominal pain. Assessment completed due to high risk readmission score. Pt reports she is fairly independent with ADLs at home. Pt uses Medicaid transportation to get to appointments. She plans to return home when medically stable. TOC will continue to follow.                 Expected Discharge Plan: Home/Self Care Barriers to Discharge: Continued Medical Work up   Patient Goals and CMS Choice Patient states their goals for this hospitalization and ongoing recovery are:: return home   Choice offered to / list presented to : Patient [place] ownership interest in [facility].provided to::  (n/a)    Expected Discharge Plan and Services In-house Referral: Clinical Social Work     Living arrangements for the past 2 months: Apartment                                      Prior Living Arrangements/Services Living arrangements for the past 2 months: Apartment Lives with:: Self Patient language and need for interpreter reviewed:: Yes Do you feel safe going back to the place where you live?: Yes      Need for Family Participation in Patient Care: No (Comment)   Current home services: DME (cane, walker, wheelchair) Criminal Activity/Legal Involvement Pertinent to Current Situation/Hospitalization: No - Comment as needed  Activities of Daily Living   ADL Screening (condition at time of admission) Independently performs ADLs?: Yes (appropriate for developmental age) Is the patient deaf or have difficulty hearing?: No Does the patient have difficulty seeing, even when wearing glasses/contacts?: No Does the patient have difficulty concentrating, remembering, or making  decisions?: No  Permission Sought/Granted                  Emotional Assessment     Affect (typically observed): Appropriate Orientation: : Oriented to Self, Oriented to Place, Oriented to  Time, Oriented to Situation Alcohol / Substance Use: Not Applicable Psych Involvement: No (comment)  Admission diagnosis:  Hypokalemia [E87.6] Intractable abdominal pain [R10.9] Intractable nausea and vomiting [R11.2] Patient Active Problem List   Diagnosis Date Noted   Intractable abdominal pain 05/27/2023   Cholecystitis 04/19/2023   Hypokalemia 03/23/2023   Abdominal discomfort 03/23/2023   Schizoaffective disorder (HCC) 03/23/2023   Bipolar disorder (HCC) 03/23/2023   GERD (gastroesophageal reflux disease) 03/23/2023   Altered mental status-resolved 03/23/2023   Nausea and vomiting 03/23/2023   Status post total right knee replacement 03/06/2023   Right knee injury, sequela 08/03/2022   Gait abnormality 04/27/2022   Osteoarthritis of right knee 04/27/2022   Injury of meniscus of left knee 09/08/2021   Left knee pain 08/11/2021   Aftercare following right knee joint replacement surgery 05/27/2020   Chronic pancreatitis (HCC) 12/01/2019   H/O total vaginal hysterectomy 05/31/2019   Acute on chronic pancreatitis (HCC) 09/15/2018   Abdominal pain 12/05/2017   Spondylosis without myelopathy or radiculopathy, lumbosacral region 04/18/2017   Vitamin D  deficiency 04/18/2017   Chronic pain syndrome 04/04/2017   Problems influencing health status 04/04/2017   Bacterial vaginosis, recurrent    Blind left eye 12/10/2014   [place]  Anterior communicating artery aneurysm 10/30/2014   Hyperglycemia 10/17/2014   Morbid obesity (HCC) 09/24/2014   Meningitis, hx, 2016 09/21/2014   Reactive airway disease 11/21/2013   Depression 11/21/2013   Seasonal allergies 11/21/2013   Bipolar affective disorder, currently in remission (HCC) 02/04/2011   Essential hypertension 02/04/2011   PCP:   Charise Companion, MD Pharmacy:   [facility] [phone] - [place], [place] - [address] [place] [address] Phone: [phone] Fax: [phone]     Social Drivers of Health (SDOH) Social History: SDOH Screenings   Food Insecurity: Unknown (05/28/2023)  Recent Concern: Food Insecurity - Food Insecurity Present (03/23/2023)  Housing: Unknown (05/28/2023)  Transportation Needs: Patient Unable To Answer (05/28/2023)  Recent Concern: Transportation Needs - Unmet Transportation Needs (04/29/2023)  Utilities: Not At Risk (04/29/2023)  Depression (PHQ2-9): Medium Risk (05/02/2023)  Social Connections: Socially Isolated (04/19/2023)  Tobacco Use: High Risk (05/27/2023)   SDOH Interventions:     Readmission Risk Interventions    05/28/2023    9:03 AM  Readmission Risk Prevention Plan  Transportation Screening Complete  HRI or Home Care Consult Complete  Social Work Consult for Recovery Care Planning/Counseling Complete  Palliative Care Screening Not Applicable  Medication Review [employer]) Complete

## 2023-05-28 NOTE — Plan of Care (Signed)
  Problem: Education: Goal: Knowledge of General Education information will improve Description: Including pain rating scale, medication(s)/side effects and non-pharmacologic comfort measures Outcome: Progressing   Problem: Health Behavior/Discharge Planning: Goal: Ability to manage health-related needs will improve Outcome: Progressing   Problem: Clinical Measurements: Goal: Ability to maintain clinical measurements within normal limits will improve Outcome: Progressing Goal: Will remain free from infection Outcome: Progressing Goal: Diagnostic test results will improve Outcome: Progressing Goal: Respiratory complications will improve Outcome: Progressing Goal: Cardiovascular complication will be avoided Outcome: Progressing   Problem: Activity: Goal: Risk for activity intolerance will decrease Outcome: Progressing   Problem: Nutrition: Goal: Adequate nutrition will be maintained Outcome: Not Progressing   Problem: Coping: Goal: Level of anxiety will decrease Outcome: Progressing   Problem: Elimination: Goal: Will not experience complications related to bowel motility Outcome: Not Progressing Goal: Will not experience complications related to urinary retention Outcome: Progressing   Problem: Pain Managment: Goal: General experience of comfort will improve and/or be controlled Outcome: Not Progressing   Problem: Safety: Goal: Ability to remain free from injury will improve Outcome: Progressing   Problem: Skin Integrity: Goal: Risk for impaired skin integrity will decrease Outcome: Progressing

## 2023-05-29 DIAGNOSIS — E876 Hypokalemia: Secondary | ICD-10-CM | POA: Diagnosis not present

## 2023-05-29 DIAGNOSIS — K219 Gastro-esophageal reflux disease without esophagitis: Secondary | ICD-10-CM | POA: Diagnosis not present

## 2023-05-29 DIAGNOSIS — R109 Unspecified abdominal pain: Secondary | ICD-10-CM

## 2023-05-29 DIAGNOSIS — R112 Nausea with vomiting, unspecified: Secondary | ICD-10-CM | POA: Diagnosis not present

## 2023-05-29 DIAGNOSIS — K209 Esophagitis, unspecified without bleeding: Secondary | ICD-10-CM

## 2023-05-29 DIAGNOSIS — I10 Essential (primary) hypertension: Secondary | ICD-10-CM | POA: Diagnosis not present

## 2023-05-29 MED ORDER — HYDROXYZINE HCL 50 MG/ML IM SOLN
50.0000 mg | Freq: Once | INTRAMUSCULAR | Status: AC
  Administered 2023-05-29: 50 mg via INTRAMUSCULAR
  Filled 2023-05-29: qty 1

## 2023-05-29 MED ORDER — HYDROMORPHONE HCL 1 MG/ML IJ SOLN
0.5000 mg | Freq: Two times a day (BID) | INTRAMUSCULAR | Status: DC | PRN
  Administered 2023-05-30: 0.5 mg via INTRAVENOUS
  Filled 2023-05-29 (×2): qty 0.5

## 2023-05-29 MED ORDER — DICYCLOMINE HCL 10 MG PO CAPS
10.0000 mg | ORAL_CAPSULE | Freq: Once | ORAL | Status: AC
Start: 2023-05-29 — End: 2023-05-29
  Administered 2023-05-29: 10 mg via ORAL
  Filled 2023-05-29: qty 1

## 2023-05-29 MED ORDER — PROCHLORPERAZINE 25 MG RE SUPP
25.0000 mg | Freq: Once | RECTAL | Status: AC
  Administered 2023-05-29: 25 mg via RECTAL
  Filled 2023-05-29: qty 1

## 2023-05-29 MED ORDER — SODIUM CHLORIDE 0.9 % IV SOLN: INTRAVENOUS | Status: DC

## 2023-05-29 MED ORDER — METOCLOPRAMIDE HCL 5 MG/ML IJ SOLN
5.0000 mg | Freq: Four times a day (QID) | INTRAMUSCULAR | Status: DC
  Administered 2023-05-29 – 2023-05-30 (×4): 5 mg via INTRAVENOUS
  Filled 2023-05-29 (×4): qty 2

## 2023-05-29 MED ORDER — SODIUM CHLORIDE 0.9 % IV SOLN: INTRAVENOUS | Status: AC

## 2023-05-29 NOTE — Progress Notes (Signed)
 Subjective: Reports having ongoing nausea and vomiting this morning. Can't keep anything down. Has probably vomited 6 times. States it feels like something is stopping it in thelower esophagus or once it hits her upper abdomen, like something is closing in and it comes right back up. Has epigastric pain and low substernal chest pain that is worse with PO intake. Actually tolerated some po while receiving IV Zofran  and Reglan , but unable to keep po down long enough and suppositories given today do not seem to be helping.   Denies NSAIDs, ETOH, Marijuana use.  Reports she had been taking Nexium  BID outpatient but had stopped carafate  about 1 month ago per PCP recommendations.   Regarding medications received: Started Reglan  scheduled yesterday at 12:30 PM, but was not given this morning due to loss of IV access.  Zofran  and pantoprazole  also not given this morning due to loss of IV access.   She has received hydroxyzine , dicyclomine , Compazine  suppository at 10 AM and p.o. Zofran  around 6 A, all of which patient reports are not helpful.    Objective: Vital signs in last 24 hours: Temp:  [97.9 F (36.6 C)-98.8 F (37.1 C)] 98.8 F (37.1 C) (05/20 0438) Pulse Rate:  [84-87] 87 (05/20 0438) Resp:  [18-19] 18 (05/20 0438) BP: (107-115)/(68-73) 107/68 (05/20 0438) SpO2:  [94 %-98 %] 94 % (05/20 0438) Last BM Date : 05/26/23 General:   Alert and oriented, pleasant, frustrated that she doesn't have an IV and is vomiting.  Head:  Normocephalic and atraumatic. Eyes:  No icterus, sclera clear. Conjuctiva pink.  Abdomen:  Bowel sounds present, soft,  non-distended.  Moderate TTP in epigastric area.  No HSM or hernias noted. No rebound or guarding. No masses appreciated  Msk:  Symmetrical without gross deformities. Normal posture Extremities:  Without edema. Neurologic:  Alert and  oriented x4;  grossly normal neurologically. Psych:  Normal mood and affect.  Intake/Output from previous  day: No intake/output data recorded. Intake/Output this shift: Total I/O In: 240 [P.O.:240] Out: -   Lab Results: Recent Labs    05/27/23 1619 05/28/23 0535  WBC 7.6 5.4  HGB 12.2 12.6  HCT 37.3 38.9  PLT 215 297   BMET Recent Labs    05/27/23 1619 05/28/23 0535  NA 134* 132*  K 3.1* 3.7  CL 103 99  CO2 23 23  GLUCOSE 117* 191*  BUN 6 5*  CREATININE 0.81 0.73  CALCIUM  9.1 9.0   LFT Recent Labs    05/27/23 1619 05/28/23 0535  PROT 7.9 8.2*  ALBUMIN 3.8 4.0  AST 16 15  ALT 13 13  ALKPHOS 75 79  BILITOT 0.5 0.5    Studies/Results: CT ABDOMEN PELVIS W CONTRAST Result Date: 05/27/2023 CLINICAL DATA:  Generalized abdominal pain EXAM: CT ABDOMEN AND PELVIS WITH CONTRAST TECHNIQUE: Multidetector CT imaging of the abdomen and pelvis was performed using the standard protocol following bolus administration of intravenous contrast. RADIATION DOSE REDUCTION: This exam was performed according to the departmental dose-optimization program which includes automated exposure control, adjustment of the mA and/or kV according to patient size and/or use of iterative reconstruction technique. CONTRAST:  OMNIPAQUE  IOHEXOL  300 MG/ML  SOLN COMPARISON:  04/28/2023 FINDINGS: Lower chest: No acute abnormality Hepatobiliary: No focal liver abnormality is seen. Status post cholecystectomy. No biliary dilatation. Pancreas: No focal abnormality or ductal dilatation. Spleen: No focal abnormality.  Normal size. Adrenals/Urinary Tract: No adrenal abnormality. No focal renal abnormality. No stones or hydronephrosis. Urinary bladder is unremarkable.  Stomach/Bowel: Stomach, large and small bowel grossly unremarkable. Vascular/Lymphatic: No evidence of aneurysm or adenopathy. Reproductive: Prior hysterectomy.  No adnexal masses. Other: No free fluid or free air. Musculoskeletal: No acute bony abnormality. IMPRESSION: No acute findings in the abdomen or pelvis. Electronically Signed   By: Janeece Mechanic  M.D.   On: 05/27/2023 18:15    Assessment: 48 y.o. year old female  with medical history significant for HTN, GERD, chronic pancreatitis and recently with suspected acute on chronic pancreatitis in late April 2025, chronic abdominal pain, cholecystectomy early April 2025, LA Grade D esophagitis on EGD March 2025, now with GI consulted due to abdominal pain and intractable N/V.   Intractable N/V and abdominal pain:  Reports sensation of foods not going all the way down, some pain in low substernal region worsened by po intake, and worsening epigastric pain postprandially that leads to vomiting soon after any oral intake.  NSAIDs, alcohol, marijuana.  CT unrevealing with normal pancreas, lipase normal, normal LFTs. Suspect repetitive vomiting contributing to abdominal pain. May have persistent esophagitis.  Per office visit with GI on 05/07/2023, patient was only taking Nexium  as needed, but patient reports taking twice daily at least since that time.  She had also previously been prescribed Carafate , but had not been taking this since the latter part of April as well. She could also have delayed gastric emptying in the setting of opioid pain medications.  Notably, patient had improvement with scheduled IV Reglan  and Zofran  that was started yesterday, but IV access was lost and she has had recurrent nausea/vomiting today that has not responded to p.o. medications or suppository.  I recommended retrying to obtain IV access with the help of ultrasound in order to restart IV medications.  May need to consider EGD due to reports of dysphagia/odynophagia. Will discuss with Dr. Alita Irwin.    Plan: Recommend IV team try for IV with ultrasound. If IV access is obtained, resume scheduled Reglan  5 mg every 6 hours and IV Zofran  4 mg every 6 hours. Needs to resume IV PPI twice daily once IV access is obtained.  Can transition to p.o. once tolerating oral intake. Clear liquids as tolerated. Could consider pancreatic  enzymes once diet is advanced.  Will discuss role of repeat EGD due to reports of dysphagia/odynophagia with Dr. Alita Irwin. Outpatient colonoscopy with primary GI.   LOS: 2 days    05/29/2023, 11:40 AM   Shana Daring, PA-C [facility]

## 2023-05-29 NOTE — Plan of Care (Signed)

## 2023-05-29 NOTE — Progress Notes (Signed)
 PROGRESS NOTE   Holly Mendez, is a 48 y.o. female, DOB - Sep 14, 1975, ZOX:[id number]  Admit date - 05/27/2023   Admitting Physician Twilla Galea, DO  Outpatient Primary MD for the patient is Charise Companion, MD  LOS - 2  Chief Complaint  Patient presents with   Abdominal Pain      Brief Narrative:  48 y.o. female with medical history significant of hypertension, GERD, chronic pancreatitis admitted with intractable emesis on 05/27/2023 in the setting of chronic opioid use    -Assessment and Plan: 1) intractable emesis-denies illicit drug use -UDS with opiates otherwise no illicit drugs -CT abdomen and pelvis without acute findings, specifically her pancreas looks okay on the CT abdomen and pelvis from 05/27/2023 -The pancreas also looked unremarkable on MRCP from 04/30/2023 -A1c 5.4 - Suspect related to chronic prescription opiate use - GI consult appreciated - Antiemetics adjusted - Be judicious with opiates---patient's persistent nausea and vomiting is somewhat related to chronic opiate use - Her PCP Dr. Andree Kayser plans to wean off narcotics over the near future 05/29/23 -Lost IV access, so having more nausea and vomiting and abdominal pain - Restart IV Reglan /IV Zofran  once IV access is reestablished - Defer to GI team if EGD is indicated  2)Chronic opiate use--- her PCP Dr. Andree Kayser will work with her to taper off opiates due to side effects including vomiting and abdominal pain associated with chronic opiate use -- Patient Nausea and Vomiting probably related to chronic opiate use  3)Hypokalemia--due to persistent emesis - Replace and recheck  4)GERD--continue Protonix  especially in view of recurrent emesis  5)HTN--continue clonidine  patch  6)Class Obesity-- -patient states she has been losing weight steadily since she underwent laparoscopic cholecystectomy in April 2025---she now has postcholecystectomy postprandial diarrhea - -try Questran  -Low calorie diet, portion control  and increase physical activity discussed with patient -Body mass index is 36.43 kg/m.  Status is: Inpatient   Disposition: The patient is from: Home              Anticipated d/c is to: Home              Anticipated d/c date is: 1 day              Patient currently is not medically stable to d/c. Barriers: Not Clinically Stable-   Code Status :  -  Code Status: Full Code   Family Communication:    NA (patient is alert, awake and coherent)   DVT Prophylaxis  :   - SCDs   enoxaparin  (LOVENOX ) injection 40 mg Start: 05/28/23 1000 SCDs Start: 05/28/23 0102   Lab Results  Component Value Date   PLT 297 05/28/2023   Inpatient Medications Scheduled Meds:  cholestyramine  light  4 g Oral TID WC   [START ON 06/03/2023] cloNIDine   0.1 mg Transdermal Q Sun   enoxaparin  (LOVENOX ) injection  40 mg Subcutaneous Q24H   metoCLOPramide  (REGLAN ) injection  5 mg Intravenous Q6H   ondansetron  (ZOFRAN ) IV  4 mg Intravenous Q6H   pantoprazole  (PROTONIX ) IV  40 mg Intravenous Q12H   Continuous Infusions:  sodium chloride      PRN Meds:.acetaminophen  **OR** acetaminophen , HYDROmorphone  (DILAUDID ) injection, ondansetron  **OR** ondansetron  (ZOFRAN ) IV   Anti-infectives (From admission, onward)    None         Subjective: [employer] today has no fevers,   No chest pain,   - -Lost IV access, so having more nausea and vomiting and abdominal pain - Restart IV Reglan /IV Zofran   once IV access is reestablished  Objective: Vitals:   05/28/23 1515 05/28/23 2035 05/29/23 0438 05/29/23 1338  BP: 115/72 113/73 107/68 (!) 155/95  Pulse: 84 84 87 86  Resp: 19 19 18 18   Temp: 98.3 F (36.8 C) 97.9 F (36.6 C) 98.8 F (37.1 C)   TempSrc:  Oral Oral   SpO2: 98% 94% 94% 96%  Weight:      Height:        Intake/Output Summary (Last 24 hours) at 05/29/2023 1501 Last data filed at 05/29/2023 0915 Gross per 24 hour  Intake 240 ml  Output --  Net 240 ml   Filed Weights   05/27/23 1553  05/27/23 2336  Weight: 104 kg 99.3 kg    Physical Exam  Gen:- Awake Alert, no acute distress HEENT:- [place].AT, No sclera icterus, artificial left eye Neck-Supple Neck,No JVD,.  Lungs-  CTAB , fair symmetrical air movement CV- S1, S2 normal, regular  Abd-  +ve B.Sounds, Abd Soft, epigastric tenderness without rebound or guarding Extremity/Skin:- No  edema, pedal pulses present  Psych-affect is appropriate, oriented x3 Neuro-no new focal deficits, no tremors  Data Reviewed: I have personally reviewed following labs and imaging studies  CBC: Recent Labs  Lab 05/27/23 1619 05/28/23 0535  WBC 7.6 5.4  NEUTROABS 4.2  --   HGB 12.2 12.6  HCT 37.3 38.9  MCV 85.2 84.7  PLT 215 297   Basic Metabolic Panel: Recent Labs  Lab 05/27/23 1619 05/28/23 0535  NA 134* 132*  K 3.1* 3.7  CL 103 99  CO2 23 23  GLUCOSE 117* 191*  BUN 6 5*  CREATININE 0.81 0.73  CALCIUM  9.1 9.0  MG  --  2.0  PHOS  --  3.4   GFR: Estimated Creatinine Clearance: 99.2 mL/min (by C-G formula based on SCr of 0.73 mg/dL). Liver Function Tests: Recent Labs  Lab 05/27/23 1619 05/28/23 0535  AST 16 15  ALT 13 13  ALKPHOS 75 79  BILITOT 0.5 0.5  PROT 7.9 8.2*  ALBUMIN 3.8 4.0   HbA1C: Recent Labs    05/28/23 1058  HGBA1C 5.4   Radiology Studies: CT ABDOMEN PELVIS W CONTRAST Result Date: 05/27/2023 CLINICAL DATA:  Generalized abdominal pain EXAM: CT ABDOMEN AND PELVIS WITH CONTRAST TECHNIQUE: Multidetector CT imaging of the abdomen and pelvis was performed using the standard protocol following bolus administration of intravenous contrast. RADIATION DOSE REDUCTION: This exam was performed according to the departmental dose-optimization program which includes automated exposure control, adjustment of the mA and/or kV according to patient size and/or use of iterative reconstruction technique. CONTRAST:  OMNIPAQUE  IOHEXOL  300 MG/ML  SOLN COMPARISON:  04/28/2023 FINDINGS: Lower chest: No acute  abnormality Hepatobiliary: No focal liver abnormality is seen. Status post cholecystectomy. No biliary dilatation. Pancreas: No focal abnormality or ductal dilatation. Spleen: No focal abnormality.  Normal size. Adrenals/Urinary Tract: No adrenal abnormality. No focal renal abnormality. No stones or hydronephrosis. Urinary bladder is unremarkable. Stomach/Bowel: Stomach, large and small bowel grossly unremarkable. Vascular/Lymphatic: No evidence of aneurysm or adenopathy. Reproductive: Prior hysterectomy.  No adnexal masses. Other: No free fluid or free air. Musculoskeletal: No acute bony abnormality. IMPRESSION: No acute findings in the abdomen or pelvis. Electronically Signed   By: Janeece Mechanic M.D.   On: 05/27/2023 18:15   Scheduled Meds:  cholestyramine  light  4 g Oral TID WC   [START ON 06/03/2023] cloNIDine   0.1 mg Transdermal Q Sun   enoxaparin  (LOVENOX ) injection  40 mg Subcutaneous Q24H  metoCLOPramide  (REGLAN ) injection  5 mg Intravenous Q6H   ondansetron  (ZOFRAN ) IV  4 mg Intravenous Q6H   pantoprazole  (PROTONIX ) IV  40 mg Intravenous Q12H   Continuous Infusions:  sodium chloride        LOS: 2 days    Colin Dawley M.D on 05/29/2023 at 3:01 PM  Go to [email or web address] - for contact info  [facility] - Office  [phone]  If 7PM-7AM, please contact night-coverage [email or web address] 05/29/2023, 3:01 PM

## 2023-05-30 ENCOUNTER — Inpatient Hospital Stay (HOSPITAL_COMMUNITY): Payer: MEDICAID | Admitting: Anesthesiology

## 2023-05-30 ENCOUNTER — Encounter (HOSPITAL_COMMUNITY): Payer: Self-pay | Admitting: Internal Medicine

## 2023-05-30 ENCOUNTER — Encounter (HOSPITAL_COMMUNITY)
Admission: EM | Disposition: A | Discharge status: Home / Self Care | Payer: Self-pay | Source: Home / Self Care | Attending: Family Medicine

## 2023-05-30 DIAGNOSIS — I10 Essential (primary) hypertension: Secondary | ICD-10-CM | POA: Diagnosis not present

## 2023-05-30 DIAGNOSIS — K2289 Other specified disease of esophagus: Secondary | ICD-10-CM

## 2023-05-30 DIAGNOSIS — R112 Nausea with vomiting, unspecified: Secondary | ICD-10-CM | POA: Diagnosis not present

## 2023-05-30 DIAGNOSIS — K449 Diaphragmatic hernia without obstruction or gangrene: Secondary | ICD-10-CM

## 2023-05-30 DIAGNOSIS — F17210 Nicotine dependence, cigarettes, uncomplicated: Secondary | ICD-10-CM | POA: Diagnosis not present

## 2023-05-30 DIAGNOSIS — K222 Esophageal obstruction: Secondary | ICD-10-CM | POA: Diagnosis not present

## 2023-05-30 DIAGNOSIS — R1310 Dysphagia, unspecified: Secondary | ICD-10-CM | POA: Diagnosis not present

## 2023-05-30 DIAGNOSIS — E876 Hypokalemia: Secondary | ICD-10-CM | POA: Diagnosis not present

## 2023-05-30 DIAGNOSIS — E66812 Obesity, class 2: Secondary | ICD-10-CM

## 2023-05-30 DIAGNOSIS — K2100 Gastro-esophageal reflux disease with esophagitis, without bleeding: Secondary | ICD-10-CM | POA: Diagnosis not present

## 2023-05-30 HISTORY — PX: ESOPHAGEAL DILATION: SHX303

## 2023-05-30 HISTORY — PX: ESOPHAGOGASTRODUODENOSCOPY: SHX5428

## 2023-05-30 LAB — IGG 4: IgG, Subclass 4: 11 mg/dL (ref 2–96)

## 2023-05-30 LAB — BASIC METABOLIC PANEL WITH GFR
Anion gap: 14 (ref 5–15)
BUN: 7 mg/dL (ref 6–20)
CO2: 22 mmol/L (ref 22–32)
Calcium: 9.2 mg/dL (ref 8.9–10.3)
Chloride: 101 mmol/L (ref 98–111)
Creatinine, Ser: 1.18 mg/dL — ABNORMAL HIGH (ref 0.44–1.00)
GFR, Estimated: 57 mL/min — ABNORMAL LOW (ref >60)
Glucose, Bld: 197 mg/dL — ABNORMAL HIGH (ref 70–99)
Potassium: 3.3 mmol/L — ABNORMAL LOW (ref 3.5–5.1)
Sodium: 137 mmol/L (ref 135–145)

## 2023-05-30 SURGERY — EGD (ESOPHAGOGASTRODUODENOSCOPY)
Anesthesia: General

## 2023-05-30 MED ORDER — PROPOFOL 500 MG/50ML IV EMUL
INTRAVENOUS | Status: DC | PRN
Start: 2023-05-30 — End: 2023-05-30
  Administered 2023-05-30: 200 ug/kg/min via INTRAVENOUS

## 2023-05-30 MED ORDER — SODIUM CHLORIDE 0.9 % IV SOLN: INTRAVENOUS | Status: DC

## 2023-05-30 MED ORDER — ESOMEPRAZOLE MAGNESIUM 40 MG PO CPDR: 40.0000 mg | DELAYED_RELEASE_CAPSULE | Freq: Every day | ORAL | 1 refills | Status: DC

## 2023-05-30 MED ORDER — PROPOFOL 10 MG/ML IV BOLUS
INTRAVENOUS | Status: DC | PRN
  Administered 2023-05-30: 100 mg via INTRAVENOUS

## 2023-05-30 MED ORDER — METOCLOPRAMIDE HCL 10 MG PO TABS
10.0000 mg | ORAL_TABLET | Freq: Three times a day (TID) | ORAL | 0 refills | Status: DC
Start: 2023-05-30 — End: 2023-06-13

## 2023-05-30 MED ORDER — ONDANSETRON 8 MG PO TBDP: 8.0000 mg | ORAL_TABLET | Freq: Three times a day (TID) | ORAL | 0 refills | Status: DC | PRN

## 2023-05-30 MED ORDER — LACTATED RINGERS IV SOLN: INTRAVENOUS | Status: DC

## 2023-05-30 MED ORDER — LIDOCAINE 2% (20 MG/ML) 5 ML SYRINGE
INTRAMUSCULAR | Status: DC | PRN
Start: 2023-05-30 — End: 2023-05-30
  Administered 2023-05-30: 80 mg via INTRAVENOUS

## 2023-05-30 NOTE — Op Note (Signed)
 [facility] Patient Name: Holly Mendez Procedure Date: 05/30/2023 1:37 PM MRN: [id number] Date of Birth: [birth year] Attending MD: Gemma Kelp , MD, [id number] CSN: [id number] Age: 48 Admit Type: Inpatient Procedure:                Upper GI endoscopy Indications:              Epigastric abdominal pain, Dysphagia Providers:                Gemma Kelp, MD, Vonna Guardian, Alisa App,                            Crystal Page Referring MD:              Medicines:                Propofol  per Anesthesia Complications:            No immediate complications. Estimated Blood Loss:     Estimated blood loss was minimal. Procedure:                Pre-Anesthesia Assessment:                           - Prior to the procedure, a History and Physical                            was performed, and patient medications and                            allergies were reviewed. The patient's tolerance of                            previous anesthesia was also reviewed. The risks                            and benefits of the procedure and the sedation                            options and risks were discussed with the patient.                            All questions were answered, and informed consent                            was obtained. Prior Anticoagulants: The patient                            last took Lovenox  (enoxaparin ) on the day of the                            procedure. ASA Grade Assessment: III - A patient                            with severe systemic disease. After reviewing the  risks and benefits, the patient was deemed in                            satisfactory condition to undergo the procedure.                           After obtaining informed consent, the endoscope was                            passed under direct vision. Throughout the                            procedure, the patient's blood pressure, pulse, and                             oxygen saturations were monitored continuously. The                            [id number]-H190 ([id number]) scope was introduced through the                            mouth, and advanced to the second part of duodenum.                            The upper GI endoscopy was accomplished without                            difficulty. The patient tolerated the procedure                            well. Scope In: 2:10:07 PM Scope Out: 2:19:52 PM Total Procedure Duration: 0 hours 9 minutes 45 seconds  Findings:      Slightly "ringed" distal esophagus. 3 mm nodule distal esophagus. No       esophagitis. Adult gastroscope traversed the esophagus without       difficulty.      A small hiatal hernia was present. Otherwise, normal-appearing gastric       mucosa. Patent pylorus.      The duodenal bulb and second portion of the duodenum were normal. Scope       was withdrawn, a 54 [place] Maloney dilator was passed to full insertion       with mild resistance. A look back revealed no complication related this       maneuver. No heme. Subsequently, the distal esophageal nodule was       biopsied and essentially removed.. Separately, histology biopsies were       taken of the distal and midesophagus. Impression:               - Ringed appearing tubular esophagus. Status post                            Holly Mendez dilation followed by biopsy. Esophageal                            nodule biopsied separatelySmall hiatal hernia.                           -  small hiatal hernia; Normal duodenal bulb and                            second portion of the duodenum. Moderate Sedation:      Moderate (conscious) sedation was personally administered by an       anesthesia professional. The following parameters were monitored: oxygen       saturation, heart rate, blood pressure, respiratory rate, EKG, adequacy       of pulmonary ventilation, and response to care. Recommendation:           - Return patient to hospital  ward for observation.                           - Advance diet as tolerated.                           - Continue present medications. Once daily PPI                            should suffice. Follow-up on pathology.                           - Return to my office (date not yet determined).                            Hopefully, patient can be discharged in the next 24                            hours if she continues to progress. Procedure Code(s):        --- Professional ---                           [phone], Esophagogastroduodenoscopy, flexible,                            transoral; diagnostic, including collection of                            specimen(s) by brushing or washing, when performed                            (separate procedure) Diagnosis Code(s):        --- Professional ---                           K44.9, Diaphragmatic hernia without obstruction or                            gangrene                           R10.13, Epigastric pain                           R13.10, Dysphagia, unspecified CPT copyright 2022 American Medical Association. All rights reserved. The codes documented in this report are preliminary and  upon coder review may  be revised to meet current compliance requirements. Windsor Hatcher. Haim Hansson, MD Gemma Kelp, MD 05/30/2023 2:32:46 PM This report has been signed electronically. Number of Addenda: 0

## 2023-05-30 NOTE — Progress Notes (Signed)
 Pt has been up in shower for past 45 minutes, sitting on chair under running water. Pt states this helps with her pain and relieves her constant sweating. Pt has noted tremors of hands and arms, restless, standing then sitting repetitively.  Pt advised of NPO status for pending GI procedure today, states understanding.

## 2023-05-30 NOTE — Anesthesia Postprocedure Evaluation (Signed)
 Anesthesia Post Note  Patient: Holly Mendez  Procedure(s) Performed: EGD (ESOPHAGOGASTRODUODENOSCOPY) DILATION, ESOPHAGUS  Anesthesia Type: General Anesthetic complications: no   No notable events documented.   Last Vitals:  Vitals:   05/30/23 1442 05/30/23 1453  BP:  123/68  Pulse: 86 74  Resp: 17 16  Temp: 36.4 C 36.5 C  SpO2: 98% 97%    Last Pain:  Vitals:   05/30/23 1453  TempSrc: Oral  PainSc:                  [name]

## 2023-05-30 NOTE — Progress Notes (Signed)
 Pt transported to Endo by [facility] for planned procedure.

## 2023-05-30 NOTE — Progress Notes (Signed)
 Pt discharged via WC to [facility] transport van.

## 2023-05-30 NOTE — Anesthesia Postprocedure Evaluation (Signed)
 Anesthesia Post Note  Patient: Holly Mendez  Procedure(s) Performed: EGD (ESOPHAGOGASTRODUODENOSCOPY) DILATION, ESOPHAGUS  Patient location during evaluation: PACU Anesthesia Type: General Level of consciousness: awake and alert Pain management: pain level controlled Vital Signs Assessment: post-procedure vital signs reviewed and stable Respiratory status: spontaneous breathing, nonlabored ventilation, respiratory function stable and patient connected to nasal cannula oxygen Cardiovascular status: stable and blood pressure returned to baseline Postop Assessment: no apparent nausea or vomiting Anesthetic complications: no  No notable events documented.   Last Vitals:  Vitals:   05/30/23 1442 05/30/23 1453  BP:  123/68  Pulse: 86 74  Resp: 17 16  Temp: 36.4 C 36.5 C  SpO2: 98% 97%    Last Pain:  Vitals:   05/30/23 1453  TempSrc: Oral  PainSc:                  [name]

## 2023-05-30 NOTE — Interval H&P Note (Signed)
 History and Physical Interval Note:  05/30/2023 1:42 PM  Holly Mendez  has presented today for surgery, with the diagnosis of epigastric abdominal pain, nausea, vomiting, odynophagia, dysphagia, history of grade D esophagitis.  The various methods of treatment have been discussed with the patient and family. After consideration of risks, benefits and other options for treatment, the patient has consented to  Procedure(s): EGD (ESOPHAGOGASTRODUODENOSCOPY) (N/A) DILATION, ESOPHAGUS (N/A) as a surgical intervention.  The patient's history has been reviewed, patient examined, no change in status, stable for surgery.  I have reviewed the patient's chart and labs.  Questions were answered to the patient's satisfaction.     Porfirio Bristol Bexlee Bergdoll  Seen in short stay.  No change in status; ongoing epigastric pain,  nausea,  vomiting,  dysphagia known LAD  esophagitis.  Nothing acute on recent CT.  Agree with need for EGD with esophageal dilation as feasible/appropriate per plan.esophagitis

## 2023-05-30 NOTE — Addendum Note (Signed)
 Addendum  created 05/30/23 1622 by [name], MD   Clinical Note Signed

## 2023-05-30 NOTE — Progress Notes (Signed)
 Called to get report from floor nurse for EGD.  Patient had 4 cups of water around 0730 this morning.  Patient has not eaten today.  Confirmed with Dr. Leisa Purchase) we must wait 4 hours before the procedure can be done.  Floor nurse informed of this and asked her to keep NPO from this point.  Crystal, floor nurse voiced understanding.

## 2023-05-30 NOTE — Plan of Care (Signed)

## 2023-05-30 NOTE — Anesthesia Preprocedure Evaluation (Addendum)
 Anesthesia Evaluation  Patient identified by MRN, date of birth, ID band Patient awake    Reviewed: Allergy & Precautions, H&P , NPO status , Patient's Chart, lab work & pertinent test results  Airway Mallampati: II  TM Distance: >3 FB Neck ROM: Full    Dental no notable dental hx.    Pulmonary Current Smoker and Patient abstained from smoking.   Pulmonary exam normal breath sounds clear to auscultation       Cardiovascular hypertension, Normal cardiovascular exam Rhythm:Regular Rate:Normal     Neuro/Psych  PSYCHIATRIC DISORDERS Anxiety Depression Bipolar Disorder Schizophrenia   Neuromuscular disease    GI/Hepatic Neg liver ROS,GERD  ,,  Endo/Other  negative endocrine ROS    Renal/GU negative Renal ROS  negative genitourinary   Musculoskeletal  (+) Arthritis ,  Fibromyalgia -  Abdominal   Peds negative pediatric ROS (+)  Hematology negative hematology ROS (+)   Anesthesia Other Findings   Reproductive/Obstetrics negative OB ROS                             Anesthesia Physical Anesthesia Plan  ASA: 3  Anesthesia Plan: General   Post-op Pain Management:    Induction: Intravenous  PONV Risk Score and Plan:   Airway Management Planned:   Additional Equipment:   Intra-op Plan:   Post-operative Plan:   Informed Consent: I have reviewed the patients History and Physical, chart, labs and discussed the procedure including the risks, benefits and alternatives for the proposed anesthesia with the patient or authorized representative who has indicated his/her understanding and acceptance.     Dental advisory given  Plan Discussed with: CRNA  Anesthesia Plan Comments:        Anesthesia Quick Evaluation

## 2023-05-30 NOTE — Transfer of Care (Signed)
 Immediate Anesthesia Transfer of Care Note  Patient: Holly Mendez  Procedure(s) Performed: EGD (ESOPHAGOGASTRODUODENOSCOPY) DILATION, ESOPHAGUS  Patient Location: PACU  Anesthesia Type:General  Level of Consciousness: awake, alert , oriented, and patient cooperative  Airway & Oxygen Therapy: Patient Spontanous Breathing  Post-op Assessment: Report given to RN, Post -op Vital signs reviewed and stable, and Patient moving all extremities X 4  Post vital signs: Reviewed and stable  Last Vitals:  Vitals Value Taken Time  BP    Temp    Pulse    Resp    SpO2      Last Pain:  Vitals:   05/30/23 1405  TempSrc:   PainSc: 0-No pain         Complications: No notable events documented.

## 2023-05-30 NOTE — Discharge Summary (Signed)
 Physician Discharge Summary   Patient: Holly Mendez MRN: [id number] DOB: [birth year]  Admit date:     05/27/2023  Discharge date: 05/30/23  Discharge Physician: Holly Mendez   PCP: Holly Companion, MD   Recommendations at discharge:  Repeat CBC to follow hemoglobin trend/stability Repeat basic metabolic panel to follow electrolyte and renal function Make sure patient follow-up with gastroenterology service for further evaluation and management of her intractable nausea/vomiting and concern for underlying gastroparesis. Continue assisting patient with weight loss management.  Discharge Diagnoses: Active Problems:   Essential hypertension   Hypokalemia   Intractable nausea and vomiting   GERD (gastroesophageal reflux disease)   Nausea and vomiting   Intractable abdominal pain   Acute esophagitis   Class 2 obesity  Brief Hospital admission narrative: As per H&P written by Dr. Elyse Hand on 05/27/2023 Holly Mendez is a 48 y.o. female with medical history significant of hypertension, GERD, chronic pancreatitis who presents to the emergency department who presents to the emergency department due to 4-day onset of abdominal pain which was epigastric with radiation to her lower abdomen and associated with nausea and vomiting.  Patient states pain was similar to prior abdominal pain during last admission (4/19 to 4/21) during which she was admitted for acute on chronic enteritis in the setting of recent laparoscopic cholecystectomy.  She denies fever, chest pain, shortness of breath.   ED Course:  In the emergency department, BP was 151/109, other vital signs were within normal range.  Workup in the ED showed normal CBC and normal BMP except for sodium of 134 and potassium of 3.1, blood glucose 117.  Urinalysis was normal.  Lipase was 27. CT abdomen and pelvis with contrast showed no acute findings in the abdominal pelvis She was treated with Dilaudid , morphine , Zofran  Phenergan ,  droperidol , Valium .  Potassium was replenished, IV hydration was provided.  TR[facility] was asked to admit patient  Assessment and Plan: 1-intractable nausea/vomiting - Patient with chronic use of narcotics most likely contributing to patient idiopathic gastroparesis/motility disorder. - Continue plans to wean patient off narcotics as already initiated by patient's PCP - Reglan  3 times a day, daily PPI and as needed Zofran  as per GI recommendations. - Outpatient follow-up with gastroenterology will be arrange for further evaluation and management of motility disorder. -Endoscopy throughout this hospitalization did not demonstrate significant abnormalities or active bleeding.  2-chronic opiate use - Continue outpatient follow-up with PCP (currently pursuing weaning off process).  3-class II obesity -Body mass index is 36.43 kg/m.  -Low-calorie diet, portion control and increased activity discussed with patient.  4-hypertension - Continue current antihypertensive regimen - Heart healthy/low-sodium diet discussed with patient.  5-GERD - Continue PPI.  6-hypokalemia - In the setting of GI losses - Electrolytes have been repleted and is stable at discharge - Continue to follow electrolytes trend with repeat basic metabolic panel follow-up visit.  7-chronic functional diarrhea - Patient reports postprandial diarrhea after cholecystectomy - Continue outpatient follow-up with general surgery - Will recommend trial of Questran .  Consultants: Gastroenterology service Procedures performed: See below for x-ray reports. Disposition: Home Diet recommendation: Multiple small meals; low calorie and heart healthy/low-sodium diet.  DISCHARGE MEDICATION: Allergies as of 05/30/2023       Reactions   Lyrica  [pregabalin ] Swelling   Swelling of hands and feet   Zithromax [azithromycin] Other (See Comments)   Severe stomach cramping   Celebrex  [celecoxib ] Rash   Nabumetone  Swelling, Dermatitis, Rash    Naproxen  Swelling   Norvasc  [amlodipine  Besylate]  Other (See Comments)   Mouth irritation         Medication List     TAKE these medications    cetirizine  10 MG tablet Commonly known as: ZYRTEC  Take 10 mg by mouth in the morning.   cloNIDine  0.1 mg/24hr patch Commonly known as: CATAPRES  - Dosed in mg/24 hr PLACE 1 PATCH ONTO SKIN ONCE WEEKLY. What changed:  how much to take how to take this when to take this   divalproex  500 MG DR tablet Commonly known as: DEPAKOTE  Take 500 mg by mouth daily.   esomeprazole  40 MG capsule Commonly known as: NexIUM  Take 1 capsule (40 mg total) by mouth daily. What changed: when to take this   gabapentin  300 MG capsule Commonly known as: NEURONTIN  Take 600 mg by mouth at bedtime.   HYDROcodone -acetaminophen  5-325 MG tablet Commonly known as: NORCO/VICODIN Take 1 tablet by mouth every 8 (eight) hours as needed for 7 days for moderate pain (pain score 4-6), THEN 1 tablet 2 (two) times daily as needed for 7 days for moderate pain (pain score 4-6), THEN 1 tablet daily as needed for up to 14 days for moderate pain (pain score 4-6). Start taking on: May 21, 2023   hydrOXYzine  25 MG tablet Commonly known as: ATARAX  Take 25 mg by mouth 3 (three) times daily.   metoCLOPramide  10 MG tablet Commonly known as: REGLAN  Take 1 tablet (10 mg total) by mouth 3 (three) times daily before meals.   Narcan  4 MG/0.1ML Liqd nasal spray kit Generic drug: naloxone  Place 1 spray into the nose daily as needed (overdose).   ondansetron  8 MG disintegrating tablet Commonly known as: ZOFRAN -ODT Take 1 tablet (8 mg total) by mouth every 8 (eight) hours as needed. What changed:  medication strength how much to take when to take this reasons to take this   QUEtiapine  400 MG tablet Commonly known as: SEROQUEL  Take 200 mg by mouth at bedtime.   Symbicort  80-4.5 MCG/ACT inhaler Generic drug: budesonide -formoterol  INHALE 2 PUFFS INTO THE LUNGS TWICE  DAILY AS NEEDED. What changed:  how much to take how to take this when to take this reasons to take this additional instructions   Ventolin  HFA 108 (90 Base) MCG/ACT inhaler Generic drug: albuterol  INHALE TWO PUFFS INTO THE LUNGS EVERY 6 HOURS AS NEEDED WHEEZING OR SHORTNESS OF BREATH What changed:  how much to take how to take this when to take this reasons to take this        Follow-up Information     Holly Companion, MD. Schedule an appointment as soon as possible for a visit in 10 day(s).   Specialties: Family Medicine, Sports Medicine Contact information: [address] [place] [address] [phone]                Discharge Exam: Cleavon Curls Weights   05/27/23 1553 05/27/23 2336 05/30/23 1341  Weight: 104 kg 99.3 kg 99.3 kg   General exam: Alert, awake, oriented x 3 Respiratory system: Clear to auscultation. Respiratory effort normal. Cardiovascular system:RRR. No murmurs, rubs, gallops. Gastrointestinal system: Abdomen is nondistended, soft and nontender. No organomegaly or masses felt. Normal bowel sounds heard. Central nervous system: Alert and oriented. No focal neurological deficits. Extremities: No C/C/E, +pedal pulses Skin: No rashes, lesions or ulcers Psychiatry: Judgement and insight appear normal. Mood & affect appropriate.    Condition at discharge: Stable and improved.  The results of significant diagnostics from this hospitalization (including imaging, microbiology, ancillary and laboratory) are  listed below for reference.   Imaging Studies: CT ABDOMEN PELVIS W CONTRAST Result Date: 05/27/2023 CLINICAL DATA:  Generalized abdominal pain EXAM: CT ABDOMEN AND PELVIS WITH CONTRAST TECHNIQUE: Multidetector CT imaging of the abdomen and pelvis was performed using the standard protocol following bolus administration of intravenous contrast. RADIATION DOSE REDUCTION: This exam was performed according to the departmental dose-optimization program  which includes automated exposure control, adjustment of the mA and/or kV according to patient size and/or use of iterative reconstruction technique. CONTRAST:  OMNIPAQUE  IOHEXOL  300 MG/ML  SOLN COMPARISON:  04/28/2023 FINDINGS: Lower chest: No acute abnormality Hepatobiliary: No focal liver abnormality is seen. Status post cholecystectomy. No biliary dilatation. Pancreas: No focal abnormality or ductal dilatation. Spleen: No focal abnormality.  Normal size. Adrenals/Urinary Tract: No adrenal abnormality. No focal renal abnormality. No stones or hydronephrosis. Urinary bladder is unremarkable. Stomach/Bowel: Stomach, large and small bowel grossly unremarkable. Vascular/Lymphatic: No evidence of aneurysm or adenopathy. Reproductive: Prior hysterectomy.  No adnexal masses. Other: No free fluid or free air. Musculoskeletal: No acute bony abnormality. IMPRESSION: No acute findings in the abdomen or pelvis. Electronically Signed   By: Janeece Mechanic M.D.   On: 05/27/2023 18:15    Microbiology: Results for orders placed or performed during the hospital encounter of 03/05/23  Surgical pcr screen     Status: None   Collection Time: 03/05/23  8:33 AM   Specimen: Nasal Mucosa; Nasal Swab  Result Value Ref Range Status   MRSA, PCR NEGATIVE NEGATIVE Final   Staphylococcus aureus NEGATIVE NEGATIVE Final    Comment: (NOTE) The Xpert SA Assay (FDA approved for NASAL specimens in patients 33 years of age and older), is one component of a comprehensive surveillance program. It is not intended to diagnose infection nor to guide or monitor treatment. Performed at [facility], [address] [place], [place] [address]     Labs: CBC: Recent Labs  Lab 05/27/23 1619 05/28/23 0535  WBC 7.6 5.4  NEUTROABS 4.2  --   HGB 12.2 12.6  HCT 37.3 38.9  MCV 85.2 84.7  PLT 215 297   Basic Metabolic Panel: Recent Labs  Lab 05/27/23 1619 05/28/23 0535 05/30/23 0440  NA 134* 132* 137  K 3.1* 3.7 3.3*   CL 103 99 101  CO2 23 23 22   GLUCOSE 117* 191* 197*  BUN 6 5* 7  CREATININE 0.81 0.73 1.18*  CALCIUM  9.1 9.0 9.2  MG  --  2.0  --   PHOS  --  3.4  --    Liver Function Tests: Recent Labs  Lab 05/27/23 1619 05/28/23 0535  AST 16 15  ALT 13 13  ALKPHOS 75 79  BILITOT 0.5 0.5  PROT 7.9 8.2*  ALBUMIN 3.8 4.0   CBG: No results for input(s): "GLUCAP" in the last 168 hours.  Discharge time spent: greater than 30 minutes.  Signed: Justina Oman, MD [facility] 05/30/2023

## 2023-05-31 ENCOUNTER — Telehealth: Payer: Self-pay

## 2023-05-31 ENCOUNTER — Encounter (HOSPITAL_COMMUNITY): Payer: Self-pay | Admitting: Internal Medicine

## 2023-05-31 NOTE — Transitions of Care (Post Inpatient/ED Visit) (Unsigned)
   05/31/2023  Name: Holly Mendez MRN: [id number] DOB: 10-Jul-1975  Today's TOC FU Call Status: Today's TOC FU Call Status:: Unsuccessful Call (1st Attempt) Unsuccessful Call (1st Attempt) Date: 05/31/23  Attempted to reach the patient regarding the most recent Inpatient/ED visit.  Follow Up Plan: Additional outreach attempts will be made to reach the patient to complete the Transitions of Care (Post Inpatient/ED visit) call.   Signature Darrall Ellison, LPN [facility] Nurse Health Advisor Direct Dial [phone]

## 2023-06-01 LAB — SURGICAL PATHOLOGY

## 2023-06-05 ENCOUNTER — Telehealth: Payer: Self-pay | Admitting: Orthopaedic Surgery

## 2023-06-05 NOTE — Telephone Encounter (Signed)
 Patient called. She would like a knee brace for her knee. Would like a call with a brace suggestion. Her cb# [phone]

## 2023-06-05 NOTE — Transitions of Care (Post Inpatient/ED Visit) (Signed)
 06/05/2023  Name: Holly Mendez MRN: [id number] DOB: [birth year]  Today's TOC FU Call Status: Today's TOC FU Call Status:: Successful TOC FU Call Completed Unsuccessful Call (1st Attempt) Date: 05/31/23 [facility] FU Call Complete Date: 06/05/23 Patient's Name and Date of Birth confirmed.  Transition Care Management Follow-up Telephone Call Date of Discharge: 05/30/23 Discharge Facility[facility] Type of Discharge: Inpatient Admission Primary Inpatient Discharge Diagnosis:: abd pain How have you been since you were released from the hospital?: Same Any questions or concerns?: No  Items Reviewed: Did you receive and understand the discharge instructions provided?: Yes Medications obtained,verified, and reconciled?: Yes (Medications Reviewed) Any new allergies since your discharge?: No Dietary orders reviewed?: Yes Do you have support at home?: No  Medications Reviewed Today: Medications Reviewed Today     Reviewed by Darrall Ellison, LPN (Licensed Practical Nurse) on 06/05/23 at 1452  Med List Status: <None>   Medication Order Taking? Sig Documenting Provider Last Dose Status Informant  cetirizine  (ZYRTEC ) 10 MG tablet [id number] No Take 10 mg by mouth in the morning. [provider] Past Week Active Self, Pharmacy Records  cloNIDine  (CATAPRES  - DOSED IN MG/24 HR) 0.1 mg/24hr patch [id number] No PLACE 1 PATCH ONTO SKIN ONCE WEEKLY.  Patient taking differently: Place 0.1 mg onto the skin every Sunday. PLACE 1 PATCH ONTO SKIN ONCE WEEKLY.   Charise Companion, MD Past Month Active Self, Pharmacy Records           Med Note [facility] Garibaldi, SUSAN A   Thu Apr 19, 2023  3:43 PM)    divalproex  (DEPAKOTE ) 500 MG DR tablet [id number] No Take 500 mg by mouth daily. [provider] Past Week Active Self, Pharmacy Records  esomeprazole  (NEXIUM ) 40 MG capsule [id number]  Take 1 capsule (40 mg total) by mouth daily. Justina Oman, MD  Active   gabapentin  (NEURONTIN ) 300 MG  capsule [id number] No Take 600 mg by mouth at bedtime. [provider] Past Month Active Self, Pharmacy Records  HYDROcodone -acetaminophen  (NORCO/VICODIN) 5-325 MG tablet [id number] No Take 1 tablet by mouth every 8 (eight) hours as needed for 7 days for moderate pain (pain score 4-6), THEN 1 tablet 2 (two) times daily as needed for 7 days for moderate pain (pain score 4-6), THEN 1 tablet daily as needed for up to 14 days for moderate pain (pain score 4-6). Charise Companion, MD Past Month Active Self, Pharmacy Records  hydrOXYzine  (ATARAX ) 25 MG tablet [id number] No Take 25 mg by mouth 3 (three) times daily. [provider] Past Month Active Self, Pharmacy Records  metoCLOPramide  (REGLAN ) 10 MG tablet [id number]  Take 1 tablet (10 mg total) by mouth 3 (three) times daily before meals. Justina Oman, MD  Active   NARCAN  4 MG/0.1ML LIQD nasal spray kit [id number] No Place 1 spray into the nose daily as needed (overdose). Charise Companion, MD Unknown Active Self, Pharmacy Records           Med Note [facility] [place], SUSAN A   Thu Apr 19, 2023  3:43 PM)    ondansetron  (ZOFRAN -ODT) 8 MG disintegrating tablet [id number]  Take 1 tablet (8 mg total) by mouth every 8 (eight) hours as needed. Justina Oman, MD  Active   QUEtiapine  (SEROQUEL ) 400 MG tablet [id number] No Take 200 mg by mouth at bedtime. [provider] Past Week Active Self, Pharmacy Records  SYMBICORT  80-4.5 MCG/ACT inhaler [id number] No INHALE 2 PUFFS INTO THE LUNGS TWICE DAILY AS NEEDED.  Patient taking differently:  Inhale 2 puffs into the lungs 2 (two) times daily as needed.   Charise Companion, MD Taking Active Self, Pharmacy Records  VENTOLIN  HFA 108 [phone] Base) MCG/ACT inhaler [id number] No INHALE TWO PUFFS INTO THE LUNGS EVERY 6 HOURS AS NEEDED WHEEZING OR SHORTNESS OF BREATH  Patient taking differently: Inhale 1-2 puffs into the lungs every 6 (six) hours as needed for shortness of breath or wheezing. INHALE TWO PUFFS INTO THE LUNGS  EVERY 6 HOURS AS NEEDED WHEEZING OR SHORTNESS OF BREATH   Charise Companion, MD Past Month Active Self, Pharmacy Records            Home Care and Equipment/Supplies: Were Home Health Services Ordered?: NA Any new equipment or medical supplies ordered?: NA  Functional Questionnaire: Do you need assistance with bathing/showering or dressing?: No Do you need assistance with meal preparation?: No Do you need assistance with eating?: No Do you have difficulty maintaining continence: No Do you need assistance with getting out of bed/getting out of a chair/moving?: No Do you have difficulty managing or taking your medications?: No  Follow up appointments reviewed: PCP Follow-up appointment confirmed?: Yes Date of PCP follow-up appointment?: 06/13/23 Follow-up Provider: [facility] Follow-up appointment confirmed?: No Reason Specialist Follow-Up Not Confirmed: Patient has Specialist Provider Number and will Call for Appointment Do you need transportation to your follow-up appointment?: No Do you understand care options if your condition(s) worsen?: Yes-patient verbalized understanding    SIGNATURE Darrall Ellison, LPN [facility] Nurse Health Advisor Direct Dial [phone]

## 2023-06-06 ENCOUNTER — Telehealth: Payer: Self-pay | Admitting: Gastroenterology

## 2023-06-06 ENCOUNTER — Ambulatory Visit: Payer: Self-pay | Admitting: Internal Medicine

## 2023-06-06 NOTE — Telephone Encounter (Signed)
 Left message for pt to call back

## 2023-06-06 NOTE — Telephone Encounter (Signed)
 PT is returning call to speak about recent hospitalization

## 2023-06-06 NOTE — Telephone Encounter (Signed)
 Inbound call from patient, would like to speak to a nurse in regards to recent hospitalization, patient states she was told to get in contact with her GI team to set up referral for [facility].

## 2023-06-06 NOTE — Telephone Encounter (Signed)
 Pt stated that she was recently admitted to the hospital and there was talks of her being referred to [facility]. Pt chart was reviewed and noted recent hospitalization but not any documentation of the referral that pt was speaking of. Pt to be scheduled to see Holly May NP on 06/14/2023 at 1:30 PM. ( 7 day Hold) Pt made aware.  Pt verbalized understanding with all questions answered.

## 2023-06-08 NOTE — Telephone Encounter (Signed)
 Pt was scheduled to see Deanna May NP on 06/14/2023 at 1:30 PM.  Pt made aware. Pt verbalized understanding with all questions answered.

## 2023-06-13 ENCOUNTER — Ambulatory Visit (INDEPENDENT_AMBULATORY_CARE_PROVIDER_SITE_OTHER): Payer: MEDICAID | Admitting: Family Medicine

## 2023-06-13 ENCOUNTER — Encounter: Payer: Self-pay | Admitting: Family Medicine

## 2023-06-13 VITALS — BP 131/82 | HR 81 | Ht 65.0 in | Wt 229.2 lb

## 2023-06-13 DIAGNOSIS — R109 Unspecified abdominal pain: Secondary | ICD-10-CM | POA: Diagnosis not present

## 2023-06-13 DIAGNOSIS — Z96651 Presence of right artificial knee joint: Secondary | ICD-10-CM

## 2023-06-13 DIAGNOSIS — G894 Chronic pain syndrome: Secondary | ICD-10-CM

## 2023-06-13 DIAGNOSIS — Z471 Aftercare following joint replacement surgery: Secondary | ICD-10-CM

## 2023-06-13 MED ORDER — METOCLOPRAMIDE HCL 10 MG PO TABS: 10.0000 mg | ORAL_TABLET | Freq: Three times a day (TID) | ORAL | 3 refills | Status: DC

## 2023-06-13 MED ORDER — FLUCONAZOLE 150 MG PO TABS: 150.0000 mg | ORAL_TABLET | Freq: Every day | ORAL | 4 refills | Status: DC

## 2023-06-13 MED ORDER — HYDROCODONE-ACETAMINOPHEN 5-325 MG PO TABS: ORAL_TABLET | ORAL | 0 refills | Status: DC

## 2023-06-13 MED ORDER — DICLOFENAC SODIUM 1 % EX GEL: 2.0000 g | Freq: Four times a day (QID) | CUTANEOUS | 3 refills | Status: DC

## 2023-06-13 MED ORDER — QUETIAPINE FUMARATE 200 MG PO TABS: 200.0000 mg | ORAL_TABLET | Freq: Every day | ORAL | 12 refills | Status: DC

## 2023-06-13 NOTE — Patient Instructions (Signed)
 Great to see you! I will plan on seeing you in about 3 months.

## 2023-06-14 ENCOUNTER — Encounter: Payer: Self-pay | Admitting: Gastroenterology

## 2023-06-14 ENCOUNTER — Ambulatory Visit (INDEPENDENT_AMBULATORY_CARE_PROVIDER_SITE_OTHER): Payer: MEDICAID | Admitting: Gastroenterology

## 2023-06-14 VITALS — BP 134/80 | HR 103 | Ht 65.0 in | Wt 228.4 lb

## 2023-06-14 DIAGNOSIS — R110 Nausea: Secondary | ICD-10-CM | POA: Diagnosis not present

## 2023-06-14 DIAGNOSIS — K2100 Gastro-esophageal reflux disease with esophagitis, without bleeding: Secondary | ICD-10-CM | POA: Diagnosis not present

## 2023-06-14 DIAGNOSIS — R6881 Early satiety: Secondary | ICD-10-CM

## 2023-06-14 MED ORDER — ONDANSETRON 4 MG PO TBDP: 4.0000 mg | ORAL_TABLET | Freq: Three times a day (TID) | ORAL | 2 refills | Status: DC

## 2023-06-14 NOTE — Assessment & Plan Note (Signed)
 Significant improvement in her flexion and extension.  Pain is much better.  She is a little concerned about not having opioids as a backup for pain management but since she is seeing the benefit of decreasing them from an abdominal perspective, she is willing to push forward with that.  She does have a big scar from the knee replacement but I told her we would not consider any type of treatment for keloid until after a year from the surgery date.  She is fine with this plan.

## 2023-06-14 NOTE — Assessment & Plan Note (Signed)
 This is improved since her discharge from the hospital.  She does attribute that some of this improvement may be from decreasing her narcotic medications.  Today she has felt the best in the last 2 weeks that she has felt in a long time, at least from a abdominal perspective.  She is eating small meals and no fried foods.  I recommended she continue to advance very very slowly both in amount and in a portion of fatty food in the meal.

## 2023-06-14 NOTE — Assessment & Plan Note (Signed)
 She is very amenable to our tapering off narcotics.  Next month she will get 30 pills and that will be the end of her chronic medication.  She is to use them wisely.

## 2023-06-14 NOTE — Progress Notes (Signed)
    CHIEF COMPLAINT / HPI: Chronic abdominal discomfort: Last 2 weeks have been the best she has had in quite a few months.  Eating small meals and really not eating any fatty foods. 2.  Knee pain and range of motion much better.  She has been working hard on home exercise program. 3.  Chronic pain: We had discussed tapering her off opioids and she is okay with that plan.  She realizes now that that may have been part of her abdominal issues. 4.  Mental health issues: She continues to be followed by psychiatry and has a therapist.  She is happy with both of those.  Taking her medicines regularly.  Feels like she is much more stable right now. 5.  Hypertension: Taking her medicine regularly without any problem.  No significant headaches or chest pain, no lower extremity edema.   PERTINENT  PMH / PSH: I have reviewed the patient's medications, allergies, past medical and surgical history, smoking status and updated in the EMR as appropriate.   OBJECTIVE:  BP 131/82   Pulse 81   Ht 5\' 5"  (1.651 m)   Wt 229 lb 3.2 oz (104 kg)   LMP 08/31/2018   SpO2 99%   BMI 38.14 kg/m  : Well-developed no acute distress KNEE: Right.  Anterior scar fairly thickened but healed well.  She has full extension.  She has flexion to 105 degrees.  There is small amount of swelling/effusion noted anteriorly in the suprapatellar area.  No joint line tenderness.  Calf is soft. PSYCH: AxOx4. Good eye contact.. No psychomotor retardation or agitation. Appropriate speech fluency and content. Asks and answers questions appropriately. Mood is congruent.   ASSESSMENT / PLAN:   Abdominal discomfort This is improved since her discharge from the hospital.  She does attribute that some of this improvement may be from decreasing her narcotic medications.  Today she has felt the best in the last 2 weeks that she has felt in a long time, at least from a abdominal perspective.  She is eating small meals and no fried foods.  I  recommended she continue to advance very very slowly both in amount and in a portion of fatty food in the meal.  Aftercare following right knee joint replacement surgery Significant improvement in her flexion and extension.  Pain is much better.  She is a little concerned about not having opioids as a backup for pain management but since she is seeing the benefit of decreasing them from an abdominal perspective, she is willing to push forward with that.  She does have a big scar from the knee replacement but I told her we would not consider any type of treatment for keloid until after a year from the surgery date.  She is fine with this plan.  Chronic pain syndrome She is very amenable to our tapering off narcotics.  Next month she will get 30 pills and that will be the end of her chronic medication.  She is to use them wisely.   Violetta Grice MD
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
## 2023-06-14 NOTE — Patient Instructions (Signed)
 stop Reglan  as we discussed today  Take scheduled ondansetron  4 mg three times day  Follow gastroparesis diet  Please do small frequent meals like 4-6 meals a day.  Eat and drink liquids at separate times.  Avoid high fiber foods, cook your vegetables, avoid high fat food.  Suggest spreading protein throughout the day (greek yogurt, glucerna, soft meat, milk, eggs) Choose soft foods that you can mash with a fork When you are more symptomatic, change to pureed foods foods and liquids.  Consider reading "Living well with Gastroparesis" by Creasie Doctor  You have been scheduled for a gastric emptying scan at [facility] on 07/02/23 at 7:30am. Please arrive at least 30 minutes prior to your appointment for registration. Please make certain not to have anything to eat or drink after midnight the night before your test. Hold all stomach medications (ex: Zofran , phenergan , Reglan ) 24 hours prior to your test. If you need to reschedule your appointment, please contact radiology scheduling at [phone]. _____________________________________________________________________ A gastric-emptying study measures how long it takes for food to move through your stomach. There are several ways to measure stomach emptying. In the most common test, you eat food that contains a small amount of radioactive material. A scanner that detects the movement of the radioactive material is placed over your abdomen to monitor the rate at which food leaves your stomach. This test normally takes about 4 hours to complete. _____________________________________________________________________  _______________________________________________________  If your blood pressure at your visit was 140/90 or greater, please contact your primary care physician to follow up on this.  _______________________________________________________  If you are age 50 or older, your body mass index should be between 23-30. Your  Body mass index is 38.01 kg/m. If this is out of the aforementioned range listed, please consider follow up with your Primary Care Provider.  If you are age 26 or younger, your body mass index should be between 19-25. Your Body mass index is 38.01 kg/m. If this is out of the aformentioned range listed, please consider follow up with your Primary Care Provider.   ________________________________________________________  The [place] [facility] providers would like to encourage you to use MYCHART to communicate with providers for non-urgent requests or questions.  Due to long hold times on the telephone, sending your provider a message by [facility] may be a faster and more efficient way to get a response.  Please allow 48 business hours for a response.  Please remember that this is for non-urgent requests.  _______________________________________________________   Thank you for trusting me with your gastrointestinal care. Deanna May, RNP

## 2023-06-18 ENCOUNTER — Telehealth: Payer: Self-pay | Admitting: Orthopaedic Surgery

## 2023-06-18 ENCOUNTER — Telehealth: Payer: Self-pay | Admitting: Gastroenterology

## 2023-06-18 ENCOUNTER — Telehealth: Payer: Self-pay

## 2023-06-18 DIAGNOSIS — G894 Chronic pain syndrome: Secondary | ICD-10-CM

## 2023-06-18 NOTE — Telephone Encounter (Signed)
 Patient calls nurse line again in regards to referral.  She reports she contacted [facility] at [place].   She reports she contacted them this morning and they are accepting new patients with her insurance.   Will forward to PCP.

## 2023-06-18 NOTE — Telephone Encounter (Signed)
 Patient calls nurse line requesting a letter.   She reports she has an upcoming court date and reports she needs a letter stating Dr. Andree Kayser is her PCP. She asks we provide her next office visit as well.   Advised will draft the letter and place up front for pick up.   Patient appreciative.

## 2023-06-18 NOTE — Telephone Encounter (Signed)
 Pt stated that she needs a note for her attorney  from Deanna stating what Deanna is treating her for and that she does have a test scheduled on 07/02/2023. ( Gastric Emptying Study) Pt stated that she has a court date on 08/07/2023.  Please review and advise

## 2023-06-18 NOTE — Telephone Encounter (Signed)
 Pt states she needs a new PT referral sent to [facility]

## 2023-06-18 NOTE — Telephone Encounter (Signed)
 Pt said she has a court date 08/07/23 and her attorney is needing a note stating that she is under Deanna's care and she has an uncoming procedure scheduled.

## 2023-06-18 NOTE — Addendum Note (Signed)
 Addended byVioletta Grice L on: 06/18/2023 04:30 PM   Modules accepted: Orders

## 2023-06-18 NOTE — Telephone Encounter (Signed)
 Patient called. She will need a letter stating that she is a patient of Dr. Lucienne Ryder, date of surgery and the dates she was seen at [facility]. Her cb# [phone]

## 2023-06-18 NOTE — Telephone Encounter (Signed)
 I will place the referral.  I do not know how long it will be before they call her back.

## 2023-06-18 NOTE — Telephone Encounter (Signed)
 Patient calls nurse line regarding pain management.   She states that she had visit with Dr. Emory Harps (GI specialist) and is going to have gastic emptying procedure on 6/23. She states that GI specialist feels that she would benefit from seeing pain management provider or receiving alternative pain medication.   Patient is asking for PCP to send referral for pain management.  Will forward to PCP for review.   Elsie Halo, RN

## 2023-06-19 NOTE — Telephone Encounter (Signed)
 LMOM for patient to return call so I can get a few more details Does she want "the dates seen" just since surgery? Letter also stating how long she will be out?

## 2023-06-19 NOTE — Telephone Encounter (Addendum)
 Pt stated that she needs the note from her providers stating what they are treating her for . Pt stated that she has a court date on 08/07/2023 and the attorney is wanting to push that date out due to the pt current medical situation and is requesting documentation from her provider. Please review and advise.

## 2023-06-19 NOTE — Telephone Encounter (Signed)
 Pt made aware of Deanna May NP recommendations: Pt verbalized understanding with all questions answered.

## 2023-06-19 NOTE — Telephone Encounter (Signed)
 Patient calls nurse line again in regards to pain management referral.   Will forward to referral coordinator.

## 2023-06-20 ENCOUNTER — Telehealth: Payer: Self-pay

## 2023-06-20 NOTE — Telephone Encounter (Signed)
 Patient has been updated.

## 2023-06-20 NOTE — Telephone Encounter (Signed)
 Patient calls nurse line stating that she needs letter from Dr. Andree Kayser stating that she is under the care of GI specialist. She also needs letter to indicate her recent hospitalizations and date of upcoming GI procedure.   Spoke with Dr. Andree Kayser who gave approval for letter.   Letter drafted with requested information, printed and placed in files at front desk for pick up.   Elsie Halo, RN

## 2023-06-25 ENCOUNTER — Other Ambulatory Visit: Payer: Self-pay | Admitting: *Deleted

## 2023-06-25 ENCOUNTER — Telehealth: Payer: Self-pay

## 2023-06-25 ENCOUNTER — Other Ambulatory Visit: Payer: Self-pay | Admitting: Family Medicine

## 2023-06-25 DIAGNOSIS — Z96651 Presence of right artificial knee joint: Secondary | ICD-10-CM

## 2023-06-25 DIAGNOSIS — G8929 Other chronic pain: Secondary | ICD-10-CM

## 2023-06-25 MED ORDER — ESTRADIOL 1 MG PO TABS: 1.0000 mg | ORAL_TABLET | Freq: Every day | ORAL | 3 refills | Status: DC

## 2023-06-25 MED ORDER — CLONIDINE 0.1 MG/24HR TD PTWK: MEDICATED_PATCH | TRANSDERMAL | 3 refills | Status: DC

## 2023-06-25 NOTE — Telephone Encounter (Signed)
 Patient calls nurse line requesting HRT.  She reports she was taking Estradiol  for menopausal symptoms, however reports, it's been some years. She reports this medication worked well for her while she was taking it.   She reports lately she has been waking up drenched and reports hot flashes throughout the day.   She is requesting to go back on this medication.   She reports she will make an apt with PCP, however she would like to start medication as soon as possible.   Advised will forward to PCP.

## 2023-06-25 NOTE — Telephone Encounter (Signed)
 Patient would like to have Dr. Andree Kayser call ( or have a virtual appt ) her to discuss what was said [facility]. Macario Savin, CMA  Of note - she is upset because Orion Birks told her that Dr. Andree Kayser was taking her off the meds because she was addicted  Macario Savin, CMA

## 2023-06-25 NOTE — Telephone Encounter (Signed)
 Spoke w pt re her visit to pain cliinic: They were rude and made her feel like they thought she was addicted to meds. Took XRay of back and blood work. Asking for her records to review and they will get her back in after that is complete. She is OK to wait until follow up. I offered referral to a different pain clinic but she declined for now. and restarting estradiol . Will send in new rx estradiol  as she is having increased issues with hot flashes; s/p hysterectomyin 2021. Probably entering menopause now. Refilled clonidi9ne patches also.

## 2023-06-25 NOTE — Telephone Encounter (Signed)
 Referral has been placed to continue PT. thanks

## 2023-06-25 NOTE — Telephone Encounter (Signed)
 Patient calls nurse line in regards to pain management referral.   She reports she spoke with [facility] and reports they are requesting more information from our office to process the referral.   She reports they will not schedule her initial consult on the basis of knee replacement and abdominal pain.   She reports they are requesting more thorough diagnoses.   Advised will forward to PCP and referral coordinator.

## 2023-07-02 ENCOUNTER — Ambulatory Visit (HOSPITAL_COMMUNITY)
Admission: RE | Admit: 2023-07-02 | Discharge: 2023-07-02 | Disposition: A | Discharge status: Home / Self Care | Payer: MEDICAID | Source: Ambulatory Visit | Attending: Gastroenterology | Admitting: Gastroenterology

## 2023-07-02 ENCOUNTER — Ambulatory Visit: Payer: Self-pay | Admitting: Gastroenterology

## 2023-07-02 DIAGNOSIS — R6881 Early satiety: Secondary | ICD-10-CM | POA: Insufficient documentation

## 2023-07-02 DIAGNOSIS — R110 Nausea: Secondary | ICD-10-CM | POA: Diagnosis present

## 2023-07-02 MED ORDER — TECHNETIUM TC 99M SULFUR COLLOID
2.1400 | Freq: Once | INTRAVENOUS | Status: AC
  Administered 2023-07-02: 2.14 via ORAL

## 2023-07-02 NOTE — Telephone Encounter (Signed)
 Inbound call from patient in regards to referral from [facility] please advise.

## 2023-07-03 ENCOUNTER — Other Ambulatory Visit (HOSPITAL_COMMUNITY): Payer: Self-pay

## 2023-07-03 NOTE — Telephone Encounter (Signed)
 Pt requested that her recent office notes be faxed to [facility] pain clinic at [phone] Recent notes faxed over as requested. Pt verbalized understanding with all questions answered.

## 2023-07-04 ENCOUNTER — Encounter: Payer: Self-pay | Admitting: Orthopaedic Surgery

## 2023-07-04 ENCOUNTER — Ambulatory Visit (INDEPENDENT_AMBULATORY_CARE_PROVIDER_SITE_OTHER): Payer: MEDICAID | Admitting: Orthopaedic Surgery

## 2023-07-04 ENCOUNTER — Telehealth: Payer: Self-pay | Admitting: Gastroenterology

## 2023-07-04 ENCOUNTER — Other Ambulatory Visit (INDEPENDENT_AMBULATORY_CARE_PROVIDER_SITE_OTHER): Payer: MEDICAID

## 2023-07-04 VITALS — Wt 220.0 lb

## 2023-07-04 DIAGNOSIS — Z96651 Presence of right artificial knee joint: Secondary | ICD-10-CM

## 2023-07-04 NOTE — Telephone Encounter (Signed)
 Patient called and stated that she was wanting to speak to the nurse regarding her stomach emptying results. Patient is requesting a call back. Please advise.

## 2023-07-04 NOTE — Patient Outreach (Unsigned)
 07/04/2023 Late Entry   Lucie BROCKS Cullop patient of Rosalynn Camie CROME, MD contacted the office and was subsequently transferred to my extension. She expressed a need for assistance with her medications. I informed her that I had consulted with the pharmacy technician in our office, who indicated that, given that all of her medications are generics, there are no available programs to provide assistance. [name] expressed her gratitude for my follow-up and for checking on her behalf.       Wilbert Diver RN, BSN, [facility]  [facility], [facility]  Care Coordinator Phone: [phone]

## 2023-07-04 NOTE — Progress Notes (Signed)
 The patient is now 4 months status post a right press-fit total knee arthroplasty to treat significant right knee pain and arthritis.  She is only 48 years old.  She has had some abdominal issues since surgery and had gallbladder surgery since we have seen her.  She is also dealing with weight loss and other gastric problems that are causing her to not eat as well and she gets sick quite a bit.  She feels like her knee is unstable and she has had some falls.  Examination of her right knee today shows some swelling to be expected at 4 months but is not a lot.  Her range of motion is full and the knee is entirely ligamentously stable.  I did not feel any instability with the knee at all.  Her extensor mechanism is intact.  The patella tracks well.  2 views of the right knee show well-seated press-fit total knee arthroplasty with neutral alignment and no complicating features.  Certainly with weight loss and deconditioning the knee does need strengthening especially with her quad muscles.  She is requesting a knee brace I think it is reasonable to try hinged knee brace and she has had some falls and fortunately her range of motion is excellent.  From an orthopedic standpoint, she will work on quad strengthening exercises in the next time we need to see her is not for 6 months with a standing AP and lateral of her right knee at that visit.  We can always send her to outpatient physical therapy for strengthening of the knee if it comes to that and she will let us  know.

## 2023-07-05 NOTE — Telephone Encounter (Signed)
 Discussed NP recommendations with patient & scheduled OV for 08/08/23 at 8:50 am with Dr. Charlanne. Pt advised to contact us  sooner with any questions/concerns.

## 2023-07-05 NOTE — Telephone Encounter (Signed)
 Returned call to patient & discussed GES results. She would like to know next steps. She was seen for OV on 06/14/23 & feels that symptoms have not improved. Still very nauseous after meals, vomiting & diarrhea at least once/day. She's eating smaller, more frequent meals and bland foods with no improvement. Denies any pain. Zofran  does provide some relief. She's currently on Nexium  40 mg daily.

## 2023-07-05 NOTE — Progress Notes (Signed)
 This encounter was created in error - please disregard.

## 2023-07-09 ENCOUNTER — Telehealth: Payer: Self-pay | Admitting: Gastroenterology

## 2023-07-09 ENCOUNTER — Telehealth: Payer: Self-pay

## 2023-07-09 DIAGNOSIS — R110 Nausea: Secondary | ICD-10-CM

## 2023-07-09 MED ORDER — POLYETHYLENE GLYCOL 3350 17 GM/SCOOP PO POWD: 17.0000 g | Freq: Two times a day (BID) | ORAL | 1 refills | Status: DC | PRN

## 2023-07-09 NOTE — Telephone Encounter (Signed)
 Patient calls nurse line requesting refill on Miralax .   This is no longer on medication list. She states that she has not had it filled in a while but has been having issues with constipation.   She would like rx for 2 capfuls daily.   Reports last BM was today. Denies blood in stool.   Will forward request to PCP.   Preferred pharmacy: [facility] [place].   Chiquita JAYSON English, RN

## 2023-07-09 NOTE — Telephone Encounter (Signed)
 Patient is calling back requesting that we send over her Zofran  medication to the pharmacy so when she is there she can go ahead and pick up all her medication. Please advise.

## 2023-07-09 NOTE — Telephone Encounter (Signed)
 Patient called and stated that she is needing her Zofran  to be sent over to her pharmacy due to them never receiving a new prescription for her. Patient is wishing a call back to inform her that the medication has been sent. Please advise.

## 2023-07-10 NOTE — Patient Outreach (Signed)
 07/03/23   Holly Mendez patient of Rosalynn Camie CROME, MD contacted the office and was subsequently transferred to my extension. She expressed a need for assistance with her medications. I informed her that I had consulted with the pharmacy technician in our office, who indicated that, given that all of her medications are generics, there are no available programs to provide assistance. [name] expressed her gratitude for my follow-up and for checking on her behalf.         Wilbert Diver RN, BSN, [facility]  [facility], [facility]  Care Coordinator Phone: [phone]

## 2023-07-12 MED ORDER — ONDANSETRON 4 MG PO TBDP: 4.0000 mg | ORAL_TABLET | Freq: Three times a day (TID) | ORAL | 2 refills | Status: DC

## 2023-07-12 NOTE — Telephone Encounter (Signed)
 Script sent to pharmacy.

## 2023-07-20 ENCOUNTER — Telehealth: Payer: Self-pay

## 2023-07-20 NOTE — Telephone Encounter (Signed)
 Patient calls nurse line requesting muscle relaxer for back pain. She reports that she has been having spasms in her lower back.   She states that she is not receiving any pain medications from other offices. Reports that she has not been able to establish with pain management clinic as they did not receive enough information from our office. Will check with Margit regarding this.   Patient had mentioned amitriptyline  for muscle spasms. Advised patient that I would forward request to PCP.    Chiquita JAYSON English, RN

## 2023-07-25 ENCOUNTER — Ambulatory Visit: Payer: MEDICAID | Admitting: Family Medicine

## 2023-07-25 ENCOUNTER — Ambulatory Visit: Payer: MEDICAID | Admitting: Gastroenterology

## 2023-07-25 NOTE — Telephone Encounter (Signed)
 Patient LVM on nurse line following up on amitriptyline  request.  Called patient back. Advised PCP was out of the office last week.   Will forward to PCP.

## 2023-08-02 ENCOUNTER — Telehealth: Payer: Self-pay | Admitting: *Deleted

## 2023-08-02 NOTE — Telephone Encounter (Signed)
 I can try to do a virtual visit with her next Monday. Would she be available in early afternoon, say 2 PM? Let me know of yes and I will get a template put and and get it scheduled. THANKS! Camie Mulch

## 2023-08-02 NOTE — Telephone Encounter (Signed)
 Pt calling in requesting a home health aide. She was in the hospital so the other ([facility]) is no longer in service. I did tell her she would need an appt but you dont have anything until 9/10. Wants to know if it could be virtual. Please advise. Zamyra Allensworth [place], CMA

## 2023-08-03 NOTE — Telephone Encounter (Signed)
 Patient is willing to do a Virtual visit on 08/06/2023 @ 1400.  She can be reached @ [phone].  Thanks,  .Rosaline JONELLE Pesa, CMA

## 2023-08-06 ENCOUNTER — Other Ambulatory Visit: Payer: Self-pay | Admitting: Gastroenterology

## 2023-08-06 DIAGNOSIS — K2100 Gastro-esophageal reflux disease with esophagitis, without bleeding: Secondary | ICD-10-CM

## 2023-08-06 NOTE — Telephone Encounter (Signed)
 Patient LVM on nurse line regarding virtual appointment for today at 2 pm.   She states that she has not heard from Dr. Rosalynn yet.   Will reach out to Dr. Rosalynn regarding scheduling of patient.   Chiquita JAYSON English, RN

## 2023-08-07 ENCOUNTER — Encounter: Payer: Self-pay | Admitting: Family Medicine

## 2023-08-07 ENCOUNTER — Ambulatory Visit: Payer: MEDICAID | Admitting: Family Medicine

## 2023-08-07 ENCOUNTER — Ambulatory Visit (HOSPITAL_COMMUNITY): Payer: MEDICAID | Attending: Orthopaedic Surgery

## 2023-08-07 DIAGNOSIS — G894 Chronic pain syndrome: Secondary | ICD-10-CM

## 2023-08-07 NOTE — Progress Notes (Signed)
 Phone call. Needs paper work for [employer] hh aid fiolled out again as company we sent it o before has closed. Needs info about her pain contract with [facility]: she started on oxycodone  and started having same stomach issues so she called her GI doctor who said likely she should avoid any opioids for a while. I agree wit this. I don't think she has anything she needs to do re the pain contract and explained how those usually work; she was concerned it might look like she had failed the contract or something in the future. Since she is opting OUT of taking pain meds, Ido not think that is the case. She has appt with ttem Thursday Am and hshe will confirm.
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
## 2023-08-07 NOTE — Telephone Encounter (Signed)
 Patient calls again regarding this virtual visit that was supposed to be yesterday at [facility]. Patient states she never received a call yesterday. From what I can tell, the virtual visit was never actually scheduled. Not sure what happened, but patient is very unhappy. Told patient a message was sent to [facility] yesterday when she called and I will also be sending another message now. Patient states she will continue to call today until she hears back from [place]. Please advise.

## 2023-08-07 NOTE — Telephone Encounter (Signed)
 Contacted Dr. Rosalynn, appt made for this afternoon.  Pt agreeable. Harlene Carte, CMA

## 2023-08-08 ENCOUNTER — Ambulatory Visit (INDEPENDENT_AMBULATORY_CARE_PROVIDER_SITE_OTHER): Payer: MEDICAID | Admitting: Gastroenterology

## 2023-08-08 ENCOUNTER — Encounter (HOSPITAL_COMMUNITY): Payer: Self-pay

## 2023-08-08 ENCOUNTER — Encounter: Payer: Self-pay | Admitting: Gastroenterology

## 2023-08-08 VITALS — BP 130/78 | HR 87 | Ht 65.0 in | Wt 236.0 lb

## 2023-08-08 DIAGNOSIS — R197 Diarrhea, unspecified: Secondary | ICD-10-CM

## 2023-08-08 DIAGNOSIS — R6881 Early satiety: Secondary | ICD-10-CM | POA: Diagnosis not present

## 2023-08-08 DIAGNOSIS — R110 Nausea: Secondary | ICD-10-CM | POA: Diagnosis not present

## 2023-08-08 DIAGNOSIS — K2100 Gastro-esophageal reflux disease with esophagitis, without bleeding: Secondary | ICD-10-CM | POA: Diagnosis not present

## 2023-08-08 DIAGNOSIS — K219 Gastro-esophageal reflux disease without esophagitis: Secondary | ICD-10-CM

## 2023-08-08 DIAGNOSIS — K581 Irritable bowel syndrome with constipation: Secondary | ICD-10-CM

## 2023-08-08 DIAGNOSIS — Z1211 Encounter for screening for malignant neoplasm of colon: Secondary | ICD-10-CM

## 2023-08-08 MED ORDER — ONDANSETRON 4 MG PO TBDP: 4.0000 mg | ORAL_TABLET | Freq: Three times a day (TID) | ORAL | 4 refills | Status: DC | PRN

## 2023-08-08 MED ORDER — ESOMEPRAZOLE MAGNESIUM 40 MG PO CPDR: 40.0000 mg | DELAYED_RELEASE_CAPSULE | Freq: Two times a day (BID) | ORAL | 2 refills | Status: DC

## 2023-08-08 NOTE — Therapy (Signed)
 [facility] [address] [place], [place], [address] Phone: [phone]   Fax:  [phone]  Patient Details  Name: Holly Mendez MRN: [id number] Date of Birth: December 05, 1975 Referring Provider:  No ref. provider found  Encounter Date: 08/08/2023  Pt called and given office number to call and reschedule. Pt states she was in court yesterday and honestly forgot she had an appointment with us  on the 29 th. Pt states she will call and reschedule at her earliest convenience.   Lang Ada, PT, DPT [facility] Office: [phone] 10:39 AM, 08/08/23   [facility] [address] [place], [place], [address] Phone: [phone]   Fax:  [phone]

## 2023-08-08 NOTE — Patient Instructions (Addendum)
 _______________________________________________________  If your blood pressure at your visit was 140/90 or greater, please contact your primary care physician to follow up on this.  _______________________________________________________  If you are age 50 or older, your body mass index should be between 23-30. Your Body mass index is 39.27 kg/m. If this is out of the aforementioned range listed, please consider follow up with your Primary Care Provider.  If you are age 32 or younger, your body mass index should be between 19-25. Your Body mass index is 39.27 kg/m. If this is out of the aformentioned range listed, please consider follow up with your Primary Care Provider.   ________________________________________________________  The [facility] providers would like to encourage you to use MYCHART to communicate with providers for non-urgent requests or questions.  Due to long hold times on the telephone, sending your provider a message by [facility] may be a faster and more efficient way to get a response.  Please allow 48 business hours for a response.  Please remember that this is for non-urgent requests.  _______________________________________________________  [facility] is using a team-based approach to care.  Your team is made up of your doctor and two to three APPS. Our APPS (Nurse Practitioners and Physician Assistants) work with your physician to ensure care continuity for you. They are fully qualified to address your health concerns and develop a treatment plan. They communicate directly with your gastroenterologist to care for you. Seeing the Advanced Practice Practitioners on your physician's team can help you by facilitating care more promptly, often allowing for earlier appointments, access to diagnostic testing, procedures, and other specialty referrals.   We have sent the following medications to your pharmacy for you to pick up at your  convenience: Nexium  Zofran   Stop miralax . Wean off oxycodone   Your provider has ordered [employer] stool testing for you. You have received a kit from our office today containing all necessary supplies to complete this test. Please carefully read the stool collection instructions provided in the kit before opening the accompanying materials. In addition, be sure there is a label providing your full name and date of birth on the puritan opti-swab tube that is supplied in the kit (if you do not see a label with this information on your test tube, please make us  aware before test collection!). After completing the test, you should secure the purtian tube into the specimen biohazard bag. The [facility] E-Req sheet (including date and time of specimen collection) should be placed into the outside pocket of the specimen biohazard bag and returned to the [place] lab (basement floor of [facility]) within 3 days of collection. Please make sure to give the specimen to a staff member at the lab. DO NOT leave the specimen on the counter.   If the specimen date and time (can be found in the upper right boxed portion of the sheet) are not filled out on the E-Req sheet, the test will NOT be performed.   You have been scheduled for a colonoscopy. Please follow written instructions given to you at your visit today.   If you use inhalers (even only as needed), please bring them with you on the day of your procedure.  DO NOT TAKE 7 DAYS PRIOR TO TEST- Trulicity (dulaglutide) Ozempic, Wegovy (semaglutide) Mounjaro (tirzepatide) Bydureon Bcise (exanatide extended release)  DO NOT TAKE 1 DAY PRIOR TO YOUR TEST Rybelsus (semaglutide) Adlyxin (lixisenatide) Victoza (liraglutide) Byetta (exanatide) ___________________________________________________________________________  Thank you,  Dr. Lynnie Bring
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
## 2023-08-14 ENCOUNTER — Telehealth: Payer: Self-pay

## 2023-08-14 NOTE — Telephone Encounter (Signed)
 Patient calls nurse line to check status of PCS paperwork.   Advised that I would send message to Dr. Rosalynn regarding status.   Printed off PCS forms off [employer].   Filled out patient demographics and handed to Dr. Rosalynn for completion.   Chiquita JAYSON English, RN

## 2023-08-14 NOTE — Telephone Encounter (Signed)
 Received completed forms from Dr. Rosalynn.   Faxed to number provided on forms.   Copy made and placed in batch scanning.   Called patient and provided with update.   Patient appreciative.   Chiquita JAYSON English, RN

## 2023-08-20 NOTE — Telephone Encounter (Signed)
 Patient calls nurse line regarding this request. She states that she is unable to access mychart.   Provided patient with message per Dr. Rosalynn.   Patient states that she continues to have back pain and is asking if there is anything else that she could be prescribed to help with her pain that would not interact with her other medications.   Please advise.   Chiquita JAYSON English, RN

## 2023-08-20 NOTE — Telephone Encounter (Signed)
 Informed patient of message from Dr. Rosalynn.  .Rosaline JONELLE Pesa, CMA

## 2023-09-26 ENCOUNTER — Telehealth: Payer: Self-pay | Admitting: Gastroenterology

## 2023-09-26 ENCOUNTER — Encounter: Payer: MEDICAID | Admitting: Gastroenterology

## 2023-09-26 ENCOUNTER — Telehealth: Payer: Self-pay | Admitting: Orthopaedic Surgery

## 2023-09-26 NOTE — Telephone Encounter (Signed)
 Pt called stating she twisted her right surgical knee and asling for a call back with medical advice and see if she can get a sooner appt.. Pt has an appt for 9/25.

## 2023-09-26 NOTE — Telephone Encounter (Signed)
 Inbound call from patient states she will not attend her procedure due to not following prep instructions a week before. Patient also states she does not feel well. Patient has not rescheduled at the moment.

## 2023-09-28 NOTE — Telephone Encounter (Signed)
 Patient has appt next week.

## 2023-10-02 ENCOUNTER — Encounter: Payer: Self-pay | Admitting: Gastroenterology

## 2023-10-04 ENCOUNTER — Ambulatory Visit: Payer: MEDICAID | Admitting: Physician Assistant

## 2023-10-04 ENCOUNTER — Other Ambulatory Visit (INDEPENDENT_AMBULATORY_CARE_PROVIDER_SITE_OTHER): Payer: MEDICAID

## 2023-10-04 VITALS — Wt 247.0 lb

## 2023-10-04 DIAGNOSIS — M25561 Pain in right knee: Secondary | ICD-10-CM

## 2023-10-04 DIAGNOSIS — Z96651 Presence of right artificial knee joint: Secondary | ICD-10-CM

## 2023-10-04 NOTE — Progress Notes (Signed)
 HPI: Holly Mendez comes in today with right knee pain.  She states about 13 days ago she twisted her knee.  History of right total knee arthroplasty 03/06/2023.  Pain in the medial aspect of the knee just below the kneecap.  She felt like the knee shifted.  She uses no assistive device.  She does note that she has been doing no exercise for her knee.  She has also gained weight since we last saw her.   Review of systems: See HPI otherwise negative  Physical exam: General: Well-developed well-nourished female in no acute distress ambulates without any assistive device nonantalgic gait.  Right knee: Active full extension and full flexion.  No instability valgus varus stressing.  No abnormal warmth erythema.  Surgical incisions healed well.  Calf supple nontender.  Minimal tenderness over the right pes anserinus area.  Radiographs: Right knee 2 views: Knee is well located.  Status post right total knee arthroplasty well-seated components.  No acute fractures acute findings.  Impression: History of right total knee arthroplasty  Plan: Recommend she work on Dance movement psychotherapist.  Also Voltaren  gel 4 g up to 4 times daily to the knee.  Follow-up in December as planned sooner if there is any questions concerns or if pain persist or becomes worse.

## 2023-10-08 ENCOUNTER — Other Ambulatory Visit: Payer: Self-pay

## 2023-10-08 ENCOUNTER — Other Ambulatory Visit: Payer: Self-pay | Admitting: Gastroenterology

## 2023-10-08 DIAGNOSIS — K2100 Gastro-esophageal reflux disease with esophagitis, without bleeding: Secondary | ICD-10-CM

## 2023-10-15 ENCOUNTER — Telehealth: Payer: Self-pay

## 2023-10-15 NOTE — Telephone Encounter (Signed)
 Patient calls nurse line requesting an alternative to Clonidine .   She reports the patches are starting to fall off more often than not.  She reports she elected for patches due to her hx of nausea. She reports she doesn't feel like this is an issue anymore.   She has an apt with PCP on 10/22.  Advised will forward to PCP.

## 2023-10-18 MED ORDER — CLONIDINE HCL 0.1 MG PO TABS: ORAL_TABLET | ORAL | 1 refills | Status: DC

## 2023-10-18 NOTE — Telephone Encounter (Signed)
 Ok, please let her know that I will call in the new Rx for oral clonidine . Her dose will be one pill twice a day. If she finds that she is feeling lightheaded with that dose, decrease to once a day.   we can touch base about this change on Oct 22nd at her appt. If she has issues, please call in the interim

## 2023-10-18 NOTE — Telephone Encounter (Signed)
 Attempted to call patient to relay the message per Dr. Malone request. Patient placed me on hold, call disconnected.  If patient calls back, relay message per Dr. Rosalynn.  Harlene Reiter, CMA

## 2023-10-31 ENCOUNTER — Ambulatory Visit: Payer: MEDICAID | Admitting: Family Medicine

## 2023-11-02 ENCOUNTER — Telehealth: Payer: Self-pay

## 2023-11-02 DIAGNOSIS — G894 Chronic pain syndrome: Secondary | ICD-10-CM

## 2023-11-02 NOTE — Telephone Encounter (Signed)
 Patient calls nurse line requesting a referral to pain management.   She reports due to stomach concerns [facility] decided to stop her pain management until evaluated by GI.   She reports she was recently cleared to resume pain management by Dr. Charlanne.   However, he is requesting PCP place a new referral to pain management.   She reports she would like to go back to [facility], however they will need a new referral.  Patient has an apt with PCP on 11/12.  Advised will forward to PCP.

## 2023-11-06 NOTE — Telephone Encounter (Signed)
 Patient returns call to nurse line regarding referral. She wanted to make sure that this message was sent to Dr. Rosalynn.   Advised patient that message was sent to Dr. Rosalynn and I would send follow up message this afternoon.   Advised patient that we would let her know when there is an update regarding this matter.   Chiquita JAYSON English, RN

## 2023-11-07 NOTE — Telephone Encounter (Signed)
 I have placesd referral

## 2023-11-12 ENCOUNTER — Encounter: Payer: Self-pay | Admitting: Radiology

## 2023-11-21 ENCOUNTER — Ambulatory Visit (INDEPENDENT_AMBULATORY_CARE_PROVIDER_SITE_OTHER): Payer: MEDICAID | Admitting: Family Medicine

## 2023-11-21 VITALS — BP 103/72 | HR 90 | Ht 65.0 in | Wt 245.0 lb

## 2023-11-21 DIAGNOSIS — M25562 Pain in left knee: Secondary | ICD-10-CM

## 2023-11-21 DIAGNOSIS — I10 Essential (primary) hypertension: Secondary | ICD-10-CM | POA: Diagnosis not present

## 2023-11-21 DIAGNOSIS — F3170 Bipolar disorder, currently in remission, most recent episode unspecified: Secondary | ICD-10-CM

## 2023-11-21 DIAGNOSIS — R110 Nausea: Secondary | ICD-10-CM

## 2023-11-21 DIAGNOSIS — Z23 Encounter for immunization: Secondary | ICD-10-CM | POA: Diagnosis not present

## 2023-11-21 MED ORDER — CETIRIZINE HCL 10 MG PO TABS: 10.0000 mg | ORAL_TABLET | Freq: Every morning | ORAL | 3 refills | Status: DC

## 2023-11-21 MED ORDER — SYMBICORT 80-4.5 MCG/ACT IN AERO: INHALATION_SPRAY | RESPIRATORY_TRACT | 12 refills | Status: AC

## 2023-11-21 MED ORDER — ONDANSETRON 4 MG PO TBDP: 4.0000 mg | ORAL_TABLET | Freq: Three times a day (TID) | ORAL | 2 refills | Status: DC

## 2023-11-21 MED ORDER — NARCAN 4 MG/0.1ML NA LIQD: 1.0000 | Freq: Every day | NASAL | 0 refills | Status: DC | PRN

## 2023-11-21 MED ORDER — LUMATEPERONE TOSYLATE 42 MG PO CAPS: 42.0000 mg | ORAL_CAPSULE | Freq: Every day | ORAL | Status: DC

## 2023-11-21 MED ORDER — VENTOLIN HFA 108 (90 BASE) MCG/ACT IN AERS: INHALATION_SPRAY | RESPIRATORY_TRACT | 12 refills | Status: AC

## 2023-11-21 MED ORDER — DIVALPROEX SODIUM 250 MG PO DR TAB: 250.0000 mg | DELAYED_RELEASE_TABLET | Freq: Every day | ORAL | Status: DC

## 2023-11-23 NOTE — Progress Notes (Signed)
   Discussed the use of AI scribe software for clinical note transcription with the patient, who gave verbal consent to proceed.  History of Present Illness   Holly Mendez is a 48 year old female who presents for a flu shot and medication review. Has had her mental health medications changed and wishes to review in setting of her other meds. felling OK on new psych meds     Immunization status - Requests information regarding timing of last pneumococcal vaccination - Presents for influenza vaccination  Psychiatric symptoms - Experiences ongoing anxiety and mood swings which are seemingly improved with new meds - Attributes decrease in symptoms to recent initiation of Caplyta 42 mg once daily and Depakote  250 mg once daily at night - Currently taking clonazepam and Seroquel  200 mg as previously  Menopausal symptoms - Uses estradiol  for management of hot flashes  Gastrointestinal symptoms - No current nausea - Uses ondansetron  as needed for nausea but is currently out of medication - Takes Nexium  twice daily  Respiratory symptoms - Uses Symbicort  and Ventolin  inhalers - Requests refills for inhalers  Musculoskeletal symptoms Knee is doing better (s/p TKR) - Improvement in knee symptoms - Increased flexibility and improved ambulation        PERTINENT  PMH / PSH: I have reviewed the patient's medications, allergies, past medical and surgical history, smoking status.  Pertinent findings that relate to today's visit / issues include:   Physical Exam     Vital signs reviewed. GENERAL: Well-developed, well-nourished, no acute distress. CARDIOVASCULAR: Regular rate and rhythm no murmur gallop or rub LUNGS: Clear to auscultation bilaterally, no rales or wheeze. ABDOMEN: Soft positive bowel sounds NEURO: No gross focal neurological deficits. MSK: Movement of extremity x 4. LEFT knee full extension PSYCH: AxOx4. Good eye contact.. No psychomotor retardation or agitation.  Appropriate speech fluency and content. Asks and answers questions appropriately. Mood is congruent.         Assessment and Plan     HTN Excellent control Continue current medication regimen Clonidine  0.1 mg bid Bipolar disorder New meds reviewed and should have no significant problems with current medications Med list updated  Knee pain s/p TKR Significant improvement Applauded her work with the exercises  Health maintenance Due for flu shot. Inquired about pneumonia vaccination history. - Reviewed pneumonia vaccination history.  Chronic intermittent Nausea Ondansetron  supply depleted. No recent nausea. Ondansetron  available in dissolving form, not for daily use unless needed. - Prescribed ondansetron , up to three times daily as needed. Not to be taken regularly Hx chronic pancreatitis but improved in general from last year with no episodes requiring hospitalization

## 2023-12-10 ENCOUNTER — Other Ambulatory Visit: Payer: Self-pay | Admitting: Gastroenterology

## 2023-12-12 ENCOUNTER — Ambulatory Visit: Payer: MEDICAID | Admitting: Family Medicine

## 2023-12-18 ENCOUNTER — Ambulatory Visit: Payer: MEDICAID | Admitting: Gastroenterology

## 2023-12-19 ENCOUNTER — Ambulatory Visit: Payer: MEDICAID | Admitting: Family Medicine

## 2023-12-19 ENCOUNTER — Encounter: Payer: Self-pay | Admitting: Family Medicine

## 2023-12-19 ENCOUNTER — Other Ambulatory Visit: Payer: Self-pay | Admitting: Family Medicine

## 2023-12-19 VITALS — BP 121/87 | HR 94 | Resp 16 | Ht 65.0 in | Wt 244.8 lb

## 2023-12-19 DIAGNOSIS — S8011XA Contusion of right lower leg, initial encounter: Secondary | ICD-10-CM

## 2023-12-19 DIAGNOSIS — D489 Neoplasm of uncertain behavior, unspecified: Secondary | ICD-10-CM

## 2023-12-19 DIAGNOSIS — Z658 Other specified problems related to psychosocial circumstances: Secondary | ICD-10-CM

## 2023-12-19 DIAGNOSIS — F3175 Bipolar disorder, in partial remission, most recent episode depressed: Secondary | ICD-10-CM
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
## 2023-12-22 ENCOUNTER — Emergency Department (HOSPITAL_COMMUNITY)
Admission: EM | Admit: 2023-12-22 | Discharge: 2023-12-22 | Disposition: A | Discharge status: Home / Self Care | Payer: MEDICAID | Attending: Emergency Medicine | Admitting: Emergency Medicine

## 2023-12-22 ENCOUNTER — Encounter (HOSPITAL_COMMUNITY): Payer: Self-pay

## 2023-12-22 ENCOUNTER — Emergency Department (HOSPITAL_COMMUNITY): Payer: MEDICAID

## 2023-12-22 ENCOUNTER — Other Ambulatory Visit: Payer: Self-pay

## 2023-12-22 DIAGNOSIS — R739 Hyperglycemia, unspecified: Secondary | ICD-10-CM | POA: Insufficient documentation

## 2023-12-22 DIAGNOSIS — R748 Abnormal levels of other serum enzymes: Secondary | ICD-10-CM

## 2023-12-22 DIAGNOSIS — R112 Nausea with vomiting, unspecified: Secondary | ICD-10-CM | POA: Insufficient documentation

## 2023-12-22 DIAGNOSIS — R1030 Lower abdominal pain, unspecified: Secondary | ICD-10-CM | POA: Insufficient documentation

## 2023-12-22 DIAGNOSIS — I10 Essential (primary) hypertension: Secondary | ICD-10-CM | POA: Insufficient documentation

## 2023-12-22 LAB — CBC WITH DIFFERENTIAL/PLATELET
Abs Immature Granulocytes: 0.01 K/uL (ref 0.00–0.07)
Basophils Absolute: 0 K/uL (ref 0.0–0.1)
Basophils Relative: 0 %
Eosinophils Absolute: 0 K/uL (ref 0.0–0.5)
Eosinophils Relative: 0 %
HCT: 37.8 % (ref 36.0–46.0)
Hemoglobin: 12.5 g/dL (ref 12.0–15.0)
Immature Granulocytes: 0 %
Lymphocytes Relative: 39 %
Lymphs Abs: 3.2 K/uL (ref 0.7–4.0)
MCH: 28.7 pg (ref 26.0–34.0)
MCHC: 33.1 g/dL (ref 30.0–36.0)
MCV: 86.7 fL (ref 80.0–100.0)
Monocytes Absolute: 0.7 K/uL (ref 0.1–1.0)
Monocytes Relative: 9 %
Neutro Abs: 4.2 K/uL (ref 1.7–7.7)
Neutrophils Relative %: 52 %
Platelets: 167 K/uL (ref 150–400)
RBC: 4.36 MIL/uL (ref 3.87–5.11)
RDW: 14.5 % (ref 11.5–15.5)
WBC: 8.2 K/uL (ref 4.0–10.5)
nRBC: 0 % (ref 0.0–0.2)

## 2023-12-22 LAB — COMPREHENSIVE METABOLIC PANEL WITH GFR
ALT: 23 U/L (ref 0–44)
AST: 26 U/L (ref 15–41)
Albumin: 4.5 g/dL (ref 3.5–5.0)
Alkaline Phosphatase: 96 U/L (ref 38–126)
Anion gap: 14 (ref 5–15)
BUN: 8 mg/dL (ref 6–20)
CO2: 24 mmol/L (ref 22–32)
Calcium: 9.4 mg/dL (ref 8.9–10.3)
Chloride: 103 mmol/L (ref 98–111)
Creatinine, Ser: 0.74 mg/dL (ref 0.44–1.00)
GFR, Estimated: >60 mL/min (ref >60)
Glucose, Bld: 112 mg/dL — ABNORMAL HIGH (ref 70–99)
Potassium: 4 mmol/L (ref 3.5–5.1)
Sodium: 140 mmol/L (ref 135–145)
Total Bilirubin: 0.4 mg/dL (ref 0.0–1.2)
Total Protein: 8 g/dL (ref 6.5–8.1)

## 2023-12-22 LAB — URINALYSIS, W/ REFLEX TO CULTURE (INFECTION SUSPECTED)
Bacteria, UA: NONE SEEN
Bilirubin Urine: NEGATIVE
Glucose, UA: NEGATIVE mg/dL
Hgb urine dipstick: NEGATIVE
Ketones, ur: NEGATIVE mg/dL
Leukocytes,Ua: NEGATIVE
Nitrite: NEGATIVE
Protein, ur: NEGATIVE mg/dL
Specific Gravity, Urine: >1.046 — ABNORMAL HIGH (ref 1.005–1.030)
pH: 6 (ref 5.0–8.0)

## 2023-12-22 LAB — LIPASE, BLOOD: Lipase: 106 U/L — ABNORMAL HIGH (ref 11–51)

## 2023-12-22 MED ORDER — IOHEXOL 300 MG/ML  SOLN
100.0000 mL | Freq: Once | INTRAMUSCULAR | Status: AC | PRN
  Administered 2023-12-22: 100 mL via INTRAVENOUS

## 2023-12-22 MED ORDER — SODIUM CHLORIDE 0.9 % IV BOLUS
1000.0000 mL | Freq: Once | INTRAVENOUS | Status: AC
  Administered 2023-12-22: 1000 mL via INTRAVENOUS

## 2023-12-22 MED ORDER — METOCLOPRAMIDE HCL 5 MG/ML IJ SOLN
10.0000 mg | Freq: Once | INTRAMUSCULAR | Status: AC
  Administered 2023-12-22: 10 mg via INTRAVENOUS
  Filled 2023-12-22: qty 2

## 2023-12-22 MED ORDER — DIPHENHYDRAMINE HCL 50 MG/ML IJ SOLN
25.0000 mg | Freq: Once | INTRAMUSCULAR | Status: AC
  Administered 2023-12-22: 25 mg via INTRAVENOUS
  Filled 2023-12-22: qty 1

## 2023-12-22 MED ORDER — ONDANSETRON HCL 4 MG/2ML IJ SOLN
4.0000 mg | Freq: Once | INTRAMUSCULAR | Status: AC
  Administered 2023-12-22: 4 mg via INTRAVENOUS
  Filled 2023-12-22: qty 2

## 2023-12-22 MED ORDER — HYDROMORPHONE HCL 1 MG/ML IJ SOLN
0.5000 mg | Freq: Once | INTRAMUSCULAR | Status: AC
  Administered 2023-12-22: 0.5 mg via INTRAVENOUS
  Filled 2023-12-22: qty 0.5

## 2023-12-22 MED ORDER — METOCLOPRAMIDE HCL 10 MG PO TABS: 10.0000 mg | ORAL_TABLET | Freq: Four times a day (QID) | ORAL | 0 refills | Status: DC

## 2023-12-22 NOTE — ED Notes (Signed)
 Patient transported to CT

## 2023-12-22 NOTE — ED Provider Notes (Signed)
 [place] EMERGENCY DEPARTMENT AT [facility] Provider Note   CSN: [id number] Arrival date & time: 12/22/23  9949     Patient presents with: Nausea and Emesis   Holly Mendez is a 48 y.o. female.   The history is provided by the patient.  Emesis  She has history of hypertension, hyperlipidemia, bipolar disorder, chronic pancreatitis, GERD and comes in complaining of lower abdominal pain and vomiting since yesterday afternoon.  Pain does not radiate to the back.  She denies any diarrhea.  She denies fever or chills but has had sweats.    Prior to Admission medications  Medication Sig Start Date End Date Taking? Authorizing Provider  cetirizine  (ZYRTEC ) 10 MG tablet Take 1 tablet (10 mg total) by mouth in the morning. 11/21/23   Rosalynn Camie CROME, MD  clonazePAM (KLONOPIN) 0.5 MG tablet Take 0.5 mg by mouth 2 (two) times daily. 07/20/23   [provider]  cloNIDine  (CATAPRES ) 0.1 MG tablet Take one tablet twice a day and do not skip doses 10/18/23   Rosalynn Camie CROME, MD  diclofenac  Sodium (VOLTAREN  ARTHRITIS PAIN) 1 % GEL Apply 2 g topically 4 (four) times daily. 06/13/23   Rosalynn Camie CROME, MD  divalproex  (DEPAKOTE ) 250 MG DR tablet Take 1 tablet (250 mg total) by mouth at bedtime. 11/21/23   Rosalynn Camie CROME, MD  esomeprazole  (NEXIUM ) 40 MG capsule TAKE 1 CAPSULE(40 MG) BY MOUTH TWICE DAILY BEFORE A MEAL 12/10/23   May, Deanna J, NP  estradiol  (ESTRACE ) 1 MG tablet Take 1 tablet (1 mg total) by mouth daily. 06/25/23   Rosalynn Camie CROME, MD  fluconazole  (DIFLUCAN ) 150 MG tablet Take 1 tablet (150 mg total) by mouth daily. Take one by mouth for one dose as directed for yeast vaginitis 06/13/23   Rosalynn Camie CROME, MD  lumateperone  tosylate (CAPLYTA ) 42 MG capsule Take 1 capsule (42 mg total) by mouth daily. 11/21/23   Rosalynn Camie CROME, MD  NARCAN  4 MG/0.1ML LIQD nasal spray kit Place 1 spray into the nose daily as needed (overdose). 11/21/23   Rosalynn Camie CROME, MD  ondansetron  (ZOFRAN -ODT) 4 MG  disintegrating tablet Take 1 tablet (4 mg total) by mouth 3 (three) times daily before meals. Prn nausea 11/21/23   Rosalynn Camie CROME, MD  oxyCODONE  (OXY IR/ROXICODONE ) 5 MG immediate release tablet Take 5 mg by mouth 4 (four) times daily as needed. 12/04/23   [provider]  polyethylene glycol powder (GLYCOLAX /MIRALAX ) 17 GM/SCOOP powder Take 17 g by mouth 2 (two) times daily as needed. 07/09/23   Rosalynn Camie CROME, MD  QUEtiapine  (SEROQUEL ) 200 MG tablet Take 1 tablet (200 mg total) by mouth at bedtime. 06/13/23   Rosalynn Camie CROME, MD  SYMBICORT  80-4.5 MCG/ACT inhaler INHALE 2 PUFFS INTO THE LUNGS TWICE DAILY AS NEEDED. 11/21/23   Rosalynn Camie CROME, MD  VENTOLIN  HFA 108 (90 Base) MCG/ACT inhaler INHALE TWO PUFFS INTO THE LUNGS EVERY 6 HOURS AS NEEDED WHEEZING OR SHORTNESS OF BREATH 11/21/23   Rosalynn Camie CROME, MD    Allergies: Lyrica  [pregabalin ], Zithromax [azithromycin], Celebrex  [celecoxib ], Nabumetone , Naproxen , and Norvasc  [amlodipine  besylate]    Review of Systems  Gastrointestinal:  Positive for vomiting.  All other systems reviewed and are negative.   Updated Vital Signs BP (!) 125/106 (BP Location: Right Arm)   Pulse 95   Temp 98.1 F (36.7 C) (Oral)   Resp 16   Ht 5' 5 (1.651 m)   Wt 111 kg   LMP  08/31/2018   SpO2 98%   BMI 40.74 kg/m   Physical Exam Vitals and nursing note reviewed.   48 year old female, appears uncomfortable, but is in no acute distress. Vital signs are significant for elevated diastolic blood pressure. Oxygen saturation is 98%, which is normal. Head is normocephalic and atraumatic. PERRLA, EOMI. Oropharynx is clear. Lungs are clear without rales, wheezes, or rhonchi. Chest is nontender. Heart has regular rate and rhythm without murmur. Abdomen is soft, flat, with mild tenderness across the lower abdomen.  There is no rebound or guarding. Skin is warm and dry without rash. Neurologic: Awake and alert, moves all extremities equally.  (all labs ordered are  listed, but only abnormal results are displayed) Labs Reviewed  COMPREHENSIVE METABOLIC PANEL WITH GFR - Abnormal; Notable for the following components:      Result Value   Glucose, Bld 112 (*)    All other components within normal limits  LIPASE, BLOOD - Abnormal; Notable for the following components:   Lipase 106 (*)    All other components within normal limits  CBC WITH DIFFERENTIAL/PLATELET  URINALYSIS, W/ REFLEX TO CULTURE (INFECTION SUSPECTED)    Radiology: CT ABDOMEN PELVIS W CONTRAST Result Date: 12/22/2023 EXAM: CT ABDOMEN AND PELVIS WITH CONTRAST 12/22/2023 02:52:59 AM TECHNIQUE: CT of the abdomen and pelvis was performed with the administration of 100 mL of iohexol  (OMNIPAQUE ) 300 MG/ML solution. Multiplanar reformatted images are provided for review. Automated exposure control, iterative reconstruction, and/or weight-based adjustment of the mA/kV was utilized to reduce the radiation dose to as low as reasonably achievable. COMPARISON: 05/27/2023 CLINICAL HISTORY: LLQ abdominal pain. FINDINGS: LOWER CHEST: No acute abnormality. LIVER: The liver is within normal limits. GALLBLADDER AND BILE DUCTS: Gallbladder is unremarkable. No biliary ductal dilatation. SPLEEN: The spleen is unremarkable. PANCREAS: The pancreas is unremarkable. ADRENAL GLANDS: Adrenal glands are unremarkable. KIDNEYS, URETERS AND BLADDER: Kidneys are unremarkable. No renal calculi or obstructive changes noted. The ureter has been surgically removed. The bladder is partially distended. GI AND BOWEL: Stomach is unremarkable. Small bowel is unremarkable. No obstructive or inflammatory changes of the colon are seen. Scattered diverticular changes noted. The appendix is within normal limits. PERITONEUM AND RETROPERITONEUM: No free fluid is noted. No free air. VASCULATURE: Aorta is normal in caliber. LYMPH NODES: No lymphadenopathy. REPRODUCTIVE ORGANS: The uterus has been surgically removed. BONES AND SOFT TISSUES: No acute  bony abnormality is seen. No focal soft tissue abnormality. IMPRESSION: 1. No acute findings in the abdomen or pelvis. 2. Scattered diverticular changes without evidence of diverticulitis. Electronically signed by: Oneil Devonshire MD 12/22/2023 03:00 AM EST RP Workstation: [id number]     Procedures   Medications Ordered in the ED  sodium chloride  0.9 % bolus 1,000 mL (1,000 mLs Intravenous New Bag/Given 12/22/23 0204)  ondansetron  (ZOFRAN ) injection 4 mg (4 mg Intravenous Given 12/22/23 0202)  HYDROmorphone  (DILAUDID ) injection 0.5 mg (0.5 mg Intravenous Given 12/22/23 0203)  iohexol  (OMNIPAQUE ) 300 MG/ML solution 100 mL (100 mLs Intravenous Contrast Given 12/22/23 0245)  HYDROmorphone  (DILAUDID ) injection 0.5 mg (0.5 mg Intravenous Given 12/22/23 0342)  metoCLOPramide  (REGLAN ) injection 10 mg (10 mg Intravenous Given 12/22/23 0343)  diphenhydrAMINE  (BENADRYL ) injection 25 mg (25 mg Intravenous Given 12/22/23 0343)                                    Medical Decision Making Amount and/or Complexity of Data Reviewed Labs: ordered. Radiology: ordered.  Risk Prescription drug management.   Abdominal pain and vomiting.  This a presentation with a wide range of treatment options and carries with it a high risk of morbidity and complications.  Differential diagnosis includes, but is not limited to, small bowel obstruction, diverticulitis, pancreatitis.  I have reviewed her past records and note multiple hospitalizations related to abdominal pain and pancreatitis and vomiting, most recently on 05/27/2023.  Admitted with cholecystitis and treated with cholecystectomy on 04/20/2023.  I have ordered screening labs, CT of abdomen and pelvis, IV fluids, hydromorphone  for pain, ondansetron  for nausea.  CT scan shows no acute intra-abdominal process.  I have independently viewed the images, and agree with radiologist's interpretation.  I have reviewed her laboratory tests, and my interpretation is normal  CBC, mildly elevated random glucose level and otherwise normal comprehensive metabolic panel, mildly elevated lipase.  However, with no CT findings of inflammation around the pancreas, I do not believe that the elevated lipase indicates pancreatitis.  She did require a second injection of hydromorphone  and an injection of metoclopramide .  Following this, pain and nausea are markedly improved.  I am discharging her with prescription for metoclopramide , she states that she does have pain medication at home which she can take as needed.  Return to the emergency department if symptoms or not being adequately controlled at home.     Final diagnoses:  Lower abdominal pain  Nausea and vomiting, unspecified vomiting type  Elevated lipase  Elevated random blood glucose level    ED Discharge Orders          Ordered    metoCLOPramide  (REGLAN ) 10 MG tablet  Every 6 hours        12/22/23 0516               Raford Lenis, MD 12/22/23 [phone]

## 2023-12-22 NOTE — ED Triage Notes (Signed)
 Pt bib EMS from home. Pt complaining of N/V and 9/10 lower abdominal pain since yesterday. Pt reports hx of chronic pancreatitis and gallbladder removal complications.

## 2023-12-22 NOTE — Discharge Instructions (Signed)
 Your evaluation today did not show any serious problems causing your pain and vomiting.  Please take the nausea medicine as needed and continue taking your pain medicine as needed.  Return to the emergency department if symptoms are not being adequately controlled at home.

## 2023-12-24 LAB — DERMATOLOGY PATHOLOGY

## 2023-12-25 ENCOUNTER — Ambulatory Visit: Payer: Self-pay | Admitting: Family Medicine

## 2023-12-26 ENCOUNTER — Ambulatory Visit: Payer: MEDICAID | Admitting: Orthopaedic Surgery

## 2023-12-28 ENCOUNTER — Telehealth: Payer: Self-pay

## 2023-12-28 NOTE — Telephone Encounter (Signed)
 Patient calls nurse line requesting letter for apartment complex for emotional support animal for kitten.   She reports that Dr. Rosalynn has written this letter for her previously.   She is asking that Dr. Rosalynn write an updated letter and mail to her home address.   She is trying to pick up kitten on 01/08/24.  Forwarding to PCP.   Holly JAYSON English, RN

## 2023-12-31 ENCOUNTER — Encounter: Payer: Self-pay | Admitting: Family Medicine

## 2023-12-31 NOTE — Telephone Encounter (Signed)
Sent by mychart

## 2024-01-09 ENCOUNTER — Other Ambulatory Visit: Payer: Self-pay

## 2024-01-09 MED ORDER — METOCLOPRAMIDE HCL 10 MG PO TABS: 10.0000 mg | ORAL_TABLET | Freq: Four times a day (QID) | ORAL | 0 refills | Status: DC

## 2024-01-14 ENCOUNTER — Other Ambulatory Visit: Payer: Self-pay | Admitting: Family Medicine

## 2024-01-30 ENCOUNTER — Ambulatory Visit: Payer: MEDICAID | Admitting: Orthopaedic Surgery

## 2024-01-31 ENCOUNTER — Other Ambulatory Visit: Payer: Self-pay

## 2024-01-31 ENCOUNTER — Inpatient Hospital Stay (HOSPITAL_COMMUNITY)
Admission: EM | Admit: 2024-01-31 | Discharge: 2024-02-04 | DRG: 917 | Disposition: A | Payer: MEDICAID | Attending: Internal Medicine | Admitting: Internal Medicine

## 2024-01-31 ENCOUNTER — Encounter (HOSPITAL_COMMUNITY): Payer: Self-pay | Admitting: Emergency Medicine

## 2024-01-31 DIAGNOSIS — T50902A Poisoning by unspecified drugs, medicaments and biological substances, intentional self-harm, initial encounter: Principal | ICD-10-CM

## 2024-01-31 DIAGNOSIS — Z888 Allergy status to other drugs, medicaments and biological substances status: Secondary | ICD-10-CM

## 2024-01-31 DIAGNOSIS — Z7951 Long term (current) use of inhaled steroids: Secondary | ICD-10-CM

## 2024-01-31 DIAGNOSIS — T43592A Poisoning by other antipsychotics and neuroleptics, intentional self-harm, initial encounter: Principal | ICD-10-CM | POA: Diagnosis present

## 2024-01-31 DIAGNOSIS — Z8661 Personal history of infections of the central nervous system: Secondary | ICD-10-CM

## 2024-01-31 DIAGNOSIS — I10 Essential (primary) hypertension: Secondary | ICD-10-CM | POA: Diagnosis present

## 2024-01-31 DIAGNOSIS — R000 Tachycardia, unspecified: Secondary | ICD-10-CM | POA: Diagnosis present

## 2024-01-31 DIAGNOSIS — G8929 Other chronic pain: Secondary | ICD-10-CM | POA: Diagnosis present

## 2024-01-31 DIAGNOSIS — Z79899 Other long term (current) drug therapy: Secondary | ICD-10-CM

## 2024-01-31 DIAGNOSIS — M797 Fibromyalgia: Secondary | ICD-10-CM | POA: Diagnosis present

## 2024-01-31 DIAGNOSIS — R5383 Other fatigue: Secondary | ICD-10-CM | POA: Diagnosis present

## 2024-01-31 DIAGNOSIS — G9340 Encephalopathy, unspecified: Secondary | ICD-10-CM

## 2024-01-31 DIAGNOSIS — T50901A Poisoning by unspecified drugs, medicaments and biological substances, accidental (unintentional), initial encounter: Secondary | ICD-10-CM | POA: Diagnosis present

## 2024-01-31 DIAGNOSIS — E7800 Pure hypercholesterolemia, unspecified: Secondary | ICD-10-CM | POA: Diagnosis present

## 2024-01-31 DIAGNOSIS — K219 Gastro-esophageal reflux disease without esophagitis: Secondary | ICD-10-CM | POA: Diagnosis present

## 2024-01-31 DIAGNOSIS — F419 Anxiety disorder, unspecified: Secondary | ICD-10-CM | POA: Diagnosis present

## 2024-01-31 DIAGNOSIS — E66813 Obesity, class 3: Secondary | ICD-10-CM | POA: Diagnosis present

## 2024-01-31 DIAGNOSIS — F17210 Nicotine dependence, cigarettes, uncomplicated: Secondary | ICD-10-CM | POA: Diagnosis present

## 2024-01-31 DIAGNOSIS — G928 Other toxic encephalopathy: Secondary | ICD-10-CM | POA: Diagnosis present

## 2024-01-31 DIAGNOSIS — Z90710 Acquired absence of both cervix and uterus: Secondary | ICD-10-CM

## 2024-01-31 DIAGNOSIS — F1120 Opioid dependence, uncomplicated: Secondary | ICD-10-CM | POA: Diagnosis present

## 2024-01-31 DIAGNOSIS — J45909 Unspecified asthma, uncomplicated: Secondary | ICD-10-CM | POA: Diagnosis present

## 2024-01-31 DIAGNOSIS — I952 Hypotension due to drugs: Secondary | ICD-10-CM | POA: Diagnosis present

## 2024-01-31 DIAGNOSIS — Z6841 Body Mass Index (BMI) 40.0 and over, adult: Secondary | ICD-10-CM

## 2024-01-31 DIAGNOSIS — Z970 Presence of artificial eye: Secondary | ICD-10-CM

## 2024-01-31 DIAGNOSIS — Z881 Allergy status to other antibiotic agents status: Secondary | ICD-10-CM

## 2024-01-31 DIAGNOSIS — Z886 Allergy status to analgesic agent status: Secondary | ICD-10-CM

## 2024-01-31 DIAGNOSIS — Z9049 Acquired absence of other specified parts of digestive tract: Secondary | ICD-10-CM

## 2024-01-31 DIAGNOSIS — E559 Vitamin D deficiency, unspecified: Secondary | ICD-10-CM | POA: Diagnosis present

## 2024-01-31 DIAGNOSIS — F319 Bipolar disorder, unspecified: Secondary | ICD-10-CM | POA: Diagnosis present

## 2024-01-31 DIAGNOSIS — Z96651 Presence of right artificial knee joint: Secondary | ICD-10-CM | POA: Diagnosis present

## 2024-01-31 LAB — ACETAMINOPHEN LEVEL: Acetaminophen (Tylenol), Serum: <10 ug/mL — ABNORMAL LOW (ref 10–30)

## 2024-01-31 LAB — URINALYSIS, ROUTINE W REFLEX MICROSCOPIC
Bacteria, UA: NONE SEEN
Bilirubin Urine: NEGATIVE
Glucose, UA: NEGATIVE mg/dL
Hgb urine dipstick: NEGATIVE
Ketones, ur: NEGATIVE mg/dL
Leukocytes,Ua: NEGATIVE
Nitrite: NEGATIVE
Protein, ur: 30 mg/dL — AB
Specific Gravity, Urine: 1.021 (ref 1.005–1.030)
pH: 5 (ref 5.0–8.0)

## 2024-01-31 LAB — COMPREHENSIVE METABOLIC PANEL WITH GFR
ALT: 31 U/L (ref 0–44)
AST: 32 U/L (ref 15–41)
Albumin: 4.2 g/dL (ref 3.5–5.0)
Alkaline Phosphatase: 100 U/L (ref 38–126)
Anion gap: 15 (ref 5–15)
BUN: 17 mg/dL (ref 6–20)
CO2: 21 mmol/L — ABNORMAL LOW (ref 22–32)
Calcium: 9.1 mg/dL (ref 8.9–10.3)
Chloride: 103 mmol/L (ref 98–111)
Creatinine, Ser: 0.76 mg/dL (ref 0.44–1.00)
GFR, Estimated: >60 mL/min
Glucose, Bld: 134 mg/dL — ABNORMAL HIGH (ref 70–99)
Potassium: 3.5 mmol/L (ref 3.5–5.1)
Sodium: 140 mmol/L (ref 135–145)
Total Bilirubin: <0.2 mg/dL (ref 0.0–1.2)
Total Protein: 7.7 g/dL (ref 6.5–8.1)

## 2024-01-31 LAB — URINE DRUG SCREEN
Amphetamines: NEGATIVE
Barbiturates: NEGATIVE
Benzodiazepines: POSITIVE — AB
Cocaine: NEGATIVE
Fentanyl: NEGATIVE
Methadone Scn, Ur: NEGATIVE
Opiates: NEGATIVE
Tetrahydrocannabinol: NEGATIVE

## 2024-01-31 LAB — CBC
HCT: 36.1 % (ref 36.0–46.0)
Hemoglobin: 12.1 g/dL (ref 12.0–15.0)
MCH: 28.8 pg (ref 26.0–34.0)
MCHC: 33.5 g/dL (ref 30.0–36.0)
MCV: 86 fL (ref 80.0–100.0)
Platelets: 234 K/uL (ref 150–400)
RBC: 4.2 MIL/uL (ref 3.87–5.11)
RDW: 14.4 % (ref 11.5–15.5)
WBC: 6.1 K/uL (ref 4.0–10.5)
nRBC: 0 % (ref 0.0–0.2)

## 2024-01-31 LAB — MAGNESIUM: Magnesium: 2 mg/dL (ref 1.7–2.4)

## 2024-01-31 LAB — ETHANOL: Alcohol, Ethyl (B): <15 mg/dL

## 2024-01-31 LAB — SALICYLATE LEVEL: Salicylate Lvl: <7 mg/dL — ABNORMAL LOW (ref 7.0–30.0)

## 2024-01-31 LAB — CBG MONITORING, ED: Glucose-Capillary: 148 mg/dL — ABNORMAL HIGH (ref 70–99)

## 2024-01-31 MED ORDER — SODIUM CHLORIDE 0.9 % IV SOLN: INTRAVENOUS | Status: AC | PRN

## 2024-01-31 MED ORDER — POTASSIUM CHLORIDE 10 MEQ/100ML IV SOLN
10.0000 meq | INTRAVENOUS | Status: AC
  Administered 2024-01-31 (×3): 10 meq via INTRAVENOUS
  Filled 2024-01-31 (×3): qty 100

## 2024-01-31 MED ORDER — SODIUM CHLORIDE 0.9 % IV BOLUS
2000.0000 mL | Freq: Once | INTRAVENOUS | Status: AC
  Administered 2024-01-31: 2000 mL via INTRAVENOUS

## 2024-01-31 NOTE — ED Triage Notes (Addendum)
 Pt bib EMS for intentional drug overdose. EMS reports that pt was seen ingesting a handful of 400mg  Seroquel  tablets by police officers that were on scene prior to EMS arrival. Per EMS, police were there after receiving calls of pt threatening suicide. EMS reports pt was given 4mg  Narcan  by police prior to their arrival. Pt lethargic upon arrival. Opens eyes to verbal stimuli but does not respond verbally.

## 2024-01-31 NOTE — ED Notes (Signed)
 Spoke with Louisa at [employer] regarding ingestion of Seroquel . The following recommendations were given: Add MG level to bloodwork, Tylenol  level 4 hrs post ingestion (2320), and repeat EKG in 6hrs. State that pt is at increased risk for Torsades so keep potassium levels between 4-4.5 and MG between 2-2.5. Further recommend supportive therapies for any CNS depression, QTC prolongation, Seizures to include Benzos and Barbituates, and Hypotension -fluids and norepinephrine as needed. Minimum observation to be 6hrs and to call for any changes in pt condition. EDP made aware.
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
## 2024-02-01 ENCOUNTER — Observation Stay (HOSPITAL_COMMUNITY): Payer: MEDICAID

## 2024-02-01 DIAGNOSIS — I10 Essential (primary) hypertension: Secondary | ICD-10-CM

## 2024-02-01 DIAGNOSIS — G9340 Encephalopathy, unspecified: Secondary | ICD-10-CM | POA: Diagnosis not present

## 2024-02-01 DIAGNOSIS — Z886 Allergy status to analgesic agent status: Secondary | ICD-10-CM | POA: Diagnosis not present

## 2024-02-01 DIAGNOSIS — R5383 Other fatigue: Secondary | ICD-10-CM | POA: Diagnosis present

## 2024-02-01 DIAGNOSIS — Z79891 Long term (current) use of opiate analgesic: Secondary | ICD-10-CM | POA: Diagnosis not present

## 2024-02-01 DIAGNOSIS — T43592A Poisoning by other antipsychotics and neuroleptics, intentional self-harm, initial encounter: Secondary | ICD-10-CM | POA: Diagnosis present

## 2024-02-01 DIAGNOSIS — I952 Hypotension due to drugs: Secondary | ICD-10-CM | POA: Diagnosis present

## 2024-02-01 DIAGNOSIS — E66813 Obesity, class 3: Secondary | ICD-10-CM | POA: Diagnosis present

## 2024-02-01 DIAGNOSIS — Z881 Allergy status to other antibiotic agents status: Secondary | ICD-10-CM | POA: Diagnosis not present

## 2024-02-01 DIAGNOSIS — T50902D Poisoning by unspecified drugs, medicaments and biological substances, intentional self-harm, subsequent encounter: Secondary | ICD-10-CM | POA: Diagnosis not present

## 2024-02-01 DIAGNOSIS — G928 Other toxic encephalopathy: Secondary | ICD-10-CM | POA: Diagnosis present

## 2024-02-01 DIAGNOSIS — T50902S Poisoning by unspecified drugs, medicaments and biological substances, intentional self-harm, sequela: Secondary | ICD-10-CM

## 2024-02-01 DIAGNOSIS — T50902A Poisoning by unspecified drugs, medicaments and biological substances, intentional self-harm, initial encounter: Secondary | ICD-10-CM | POA: Diagnosis not present

## 2024-02-01 DIAGNOSIS — Z888 Allergy status to other drugs, medicaments and biological substances status: Secondary | ICD-10-CM | POA: Diagnosis not present

## 2024-02-01 DIAGNOSIS — E559 Vitamin D deficiency, unspecified: Secondary | ICD-10-CM | POA: Diagnosis present

## 2024-02-01 DIAGNOSIS — Z79899 Other long term (current) drug therapy: Secondary | ICD-10-CM | POA: Diagnosis not present

## 2024-02-01 DIAGNOSIS — Z6841 Body Mass Index (BMI) 40.0 and over, adult: Secondary | ICD-10-CM | POA: Diagnosis not present

## 2024-02-01 DIAGNOSIS — E7800 Pure hypercholesterolemia, unspecified: Secondary | ICD-10-CM | POA: Diagnosis present

## 2024-02-01 DIAGNOSIS — G8929 Other chronic pain: Secondary | ICD-10-CM | POA: Diagnosis present

## 2024-02-01 DIAGNOSIS — T50901A Poisoning by unspecified drugs, medicaments and biological substances, accidental (unintentional), initial encounter: Secondary | ICD-10-CM | POA: Diagnosis present

## 2024-02-01 DIAGNOSIS — F17210 Nicotine dependence, cigarettes, uncomplicated: Secondary | ICD-10-CM | POA: Diagnosis present

## 2024-02-01 DIAGNOSIS — J45909 Unspecified asthma, uncomplicated: Secondary | ICD-10-CM | POA: Diagnosis present

## 2024-02-01 DIAGNOSIS — F319 Bipolar disorder, unspecified: Secondary | ICD-10-CM | POA: Diagnosis present

## 2024-02-01 DIAGNOSIS — R000 Tachycardia, unspecified: Secondary | ICD-10-CM | POA: Diagnosis present

## 2024-02-01 DIAGNOSIS — K219 Gastro-esophageal reflux disease without esophagitis: Secondary | ICD-10-CM | POA: Diagnosis present

## 2024-02-01 DIAGNOSIS — Z7951 Long term (current) use of inhaled steroids: Secondary | ICD-10-CM | POA: Diagnosis not present

## 2024-02-01 DIAGNOSIS — F1120 Opioid dependence, uncomplicated: Secondary | ICD-10-CM | POA: Diagnosis present

## 2024-02-01 DIAGNOSIS — Z96651 Presence of right artificial knee joint: Secondary | ICD-10-CM | POA: Diagnosis present

## 2024-02-01 DIAGNOSIS — M797 Fibromyalgia: Secondary | ICD-10-CM | POA: Diagnosis present

## 2024-02-01 DIAGNOSIS — F419 Anxiety disorder, unspecified: Secondary | ICD-10-CM | POA: Diagnosis present

## 2024-02-01 LAB — COMPREHENSIVE METABOLIC PANEL WITH GFR
ALT: 28 U/L (ref 0–44)
AST: 24 U/L (ref 15–41)
Albumin: 4.1 g/dL (ref 3.5–5.0)
Alkaline Phosphatase: 88 U/L (ref 38–126)
Anion gap: 10 (ref 5–15)
BUN: 12 mg/dL (ref 6–20)
CO2: 27 mmol/L (ref 22–32)
Calcium: 9.1 mg/dL (ref 8.9–10.3)
Chloride: 105 mmol/L (ref 98–111)
Creatinine, Ser: 0.73 mg/dL (ref 0.44–1.00)
GFR, Estimated: >60 mL/min
Glucose, Bld: 120 mg/dL — ABNORMAL HIGH (ref 70–99)
Potassium: 4.1 mmol/L (ref 3.5–5.1)
Sodium: 142 mmol/L (ref 135–145)
Total Bilirubin: 0.4 mg/dL (ref 0.0–1.2)
Total Protein: 7.3 g/dL (ref 6.5–8.1)

## 2024-02-01 LAB — CBC
HCT: 35.9 % — ABNORMAL LOW (ref 36.0–46.0)
Hemoglobin: 11.8 g/dL — ABNORMAL LOW (ref 12.0–15.0)
MCH: 29.1 pg (ref 26.0–34.0)
MCHC: 32.9 g/dL (ref 30.0–36.0)
MCV: 88.4 fL (ref 80.0–100.0)
Platelets: 213 K/uL (ref 150–400)
RBC: 4.06 MIL/uL (ref 3.87–5.11)
RDW: 14.6 % (ref 11.5–15.5)
WBC: 4.7 K/uL (ref 4.0–10.5)
nRBC: 0 % (ref 0.0–0.2)

## 2024-02-01 LAB — T4, FREE: Free T4: 1.12 ng/dL (ref 0.80–2.00)

## 2024-02-01 LAB — MRSA NEXT GEN BY PCR, NASAL: MRSA by PCR Next Gen: NOT DETECTED

## 2024-02-01 LAB — CBG MONITORING, ED: Glucose-Capillary: 117 mg/dL — ABNORMAL HIGH (ref 70–99)

## 2024-02-01 LAB — PHOSPHORUS: Phosphorus: 4.3 mg/dL (ref 2.5–4.6)

## 2024-02-01 LAB — ACETAMINOPHEN LEVEL: Acetaminophen (Tylenol), Serum: <10 ug/mL — ABNORMAL LOW (ref 10–30)

## 2024-02-01 LAB — MAGNESIUM: Magnesium: 2.4 mg/dL (ref 1.7–2.4)

## 2024-02-01 LAB — CORTISOL-AM, BLOOD: Cortisol - AM: 24 ug/dL — ABNORMAL HIGH (ref 6.7–22.6)

## 2024-02-01 LAB — PROCALCITONIN: Procalcitonin: <0.1 ng/mL

## 2024-02-01 LAB — TSH: TSH: 0.616 u[IU]/mL (ref 0.350–4.500)

## 2024-02-01 MED ORDER — PANTOPRAZOLE SODIUM 40 MG IV SOLR
40.0000 mg | Freq: Two times a day (BID) | INTRAVENOUS | Status: DC
  Administered 2024-02-01 – 2024-02-03 (×4): 40 mg via INTRAVENOUS
  Filled 2024-02-01 (×4): qty 10

## 2024-02-01 MED ORDER — HYDRALAZINE HCL 20 MG/ML IJ SOLN: 10.0000 mg | Freq: Four times a day (QID) | INTRAMUSCULAR | Status: DC | PRN

## 2024-02-01 MED ORDER — ENOXAPARIN SODIUM 40 MG/0.4ML IJ SOSY
40.0000 mg | PREFILLED_SYRINGE | INTRAMUSCULAR | Status: DC
  Administered 2024-02-01 – 2024-02-04 (×4): 40 mg via SUBCUTANEOUS
  Filled 2024-02-01 (×4): qty 0.4

## 2024-02-01 MED ORDER — ACETAMINOPHEN 325 MG PO TABS
650.0000 mg | ORAL_TABLET | Freq: Four times a day (QID) | ORAL | Status: DC | PRN
  Administered 2024-02-02 – 2024-02-04 (×5): 650 mg via ORAL
  Filled 2024-02-01 (×5): qty 2

## 2024-02-01 MED ORDER — LACTATED RINGERS IV SOLN: INTRAVENOUS | Status: AC

## 2024-02-01 MED ORDER — PANTOPRAZOLE SODIUM 40 MG PO TBEC: 80.0000 mg | DELAYED_RELEASE_TABLET | Freq: Every day | ORAL | Status: DC

## 2024-02-01 MED ORDER — PROCHLORPERAZINE EDISYLATE 10 MG/2ML IJ SOLN: 10.0000 mg | Freq: Four times a day (QID) | INTRAMUSCULAR | Status: DC | PRN

## 2024-02-01 MED ORDER — ACETAMINOPHEN 650 MG RE SUPP: 650.0000 mg | Freq: Four times a day (QID) | RECTAL | Status: DC | PRN

## 2024-02-01 MED ORDER — CHLORHEXIDINE GLUCONATE CLOTH 2 % EX PADS: 6.0000 | MEDICATED_PAD | Freq: Every day | CUTANEOUS | Status: DC

## 2024-02-01 NOTE — Hospital Course (Signed)
 49 year old female with a history of hypertension, GERD, opioid dependence with chronic pain, IBS, anxiety/depression presenting with intentional drug overdose.  The patient.  Is currently cephalopathic.  She is unable to provide a significant history.  History is obtained from review of the medical record.  The patient had an argument with her significant other.  After the argument, the police were called to the scene and it was reported by officers on scene that the patient took a handful of Seroquel  tablets.  EMS was activated.  The patient was lethargic.  Narcan  was given, but the patient remained somnolent in the emergency department.  Poison control was contacted and recommended the following: Check magnesium  level and maintain between 2-2.5, keep potassium between 4-4.5, repeat EKG in 6 hours due to increased risk for torsades.  Further supportive therapies for CNS depression, QTc prolongation and seizures to include benzos and barbiturates and for hypertension -fluids and norepinephrine as needed.  Minimum observation to be 6 hours and to call for any changes in patient condition.   In the ED, the patient had soft blood pressure.  Oxygen saturation was 94% on 1 L.  WBC 6.1, hemoglobin 12.1, platelets 234.  Sodium 142, potassium 4.1, bicarbonate 27, serum creatinine 0.73.  AST 24, ALT 28, alk phosphatase 88, total bilirubin 0.4.  UDS was positive for benzodiazepines.  EKG showed sinus rhythm with no ST or T wave changes.  UA was negative for pyuria.  Alcohol level, salicylate, and acetaminophen  levels were negative. The patient was admitted for further evaluation and treatment of a drug overdose. The patient was monitored closely in the ICU.  She did not have any concerning dysrhythmias.  Her electrolytes were optimized.  She remained hemodynamically stable.  With next 24 hours, her mentation gradually improved.  On 02/02/2024, her mental status returned to baseline and she was alert and oriented x 4.   She was deemed to be medically stable.  Telepsychiatry was consulted. They recommended transfer to inpatient [facility].
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
## 2024-02-01 NOTE — ED Notes (Signed)
 [facility]  arrived with IVC papers

## 2024-02-01 NOTE — Plan of Care (Signed)

## 2024-02-01 NOTE — ED Provider Notes (Signed)
 I assumed care at signout. It is reported the patient took a large dose of Seroquel  prior to arrival.  She is under involuntary commitment Plan was to monitor till 3 AM, recheck EKG and Tylenol  level   EKG Interpretation Date/Time:  Friday February 01 2024 01:54:24 EST Ventricular Rate:  93 PR Interval:  153 QRS Duration:  99 QT Interval:  359 QTC Calculation: 447 R Axis:   61  Text Interpretation: Sinus rhythm Abnormal R-wave progression, early transition Confirmed by Midge Golas ([id number]) on 02/01/2024 2:08:10 AM       EKG is improved, QTc is 447 APAP level negative  Patient has been assessed multiple times Patient is still somnolent, on oxygen, and responds only to sternal rub She is currently protecting her airway  Patient is unable to be medically cleared I discussed the case with Dr. Adefeso for admission Vital signs are overall appropriate  .Critical Care  Performed by: Midge Golas, MD Authorized by: Midge Golas, MD   Critical care provider statement:    Critical care time (minutes):  60   Critical care start time:  02/01/2024 2:30 AM   Critical care end time:  02/01/2024 3:30 AM   Critical care was necessary to treat or prevent imminent or life-threatening deterioration of the following conditions:  Toxidrome and respiratory failure   Critical care was time spent personally by me on the following activities:  Development of treatment plan with patient or surrogate, ordering and review of laboratory studies, pulse oximetry, re-evaluation of patient's condition and examination of patient   I assumed direction of critical care for this patient from another provider in my specialty: yes     Care discussed with: admitting provider       Midge Golas, MD 02/01/24 [phone]

## 2024-02-01 NOTE — Progress Notes (Signed)
 During initial assessment, patient's left pupil appears more dilated and sluggish than the right. Patient is only responsive to pain and requires sternal rub to open eyes but quickly falls back asleep. Dr. Evonnie made aware. CT head ordered

## 2024-02-01 NOTE — ED Notes (Signed)
 Pt aroused when rectal temp being taken. Requested bare hugger be removed. Pts rectal temp 98.6. bare hugger off at this time.

## 2024-02-01 NOTE — Progress Notes (Signed)
 "          PROGRESS NOTE  [name] FMW:[id number] DOB: Mar 03, 1975 DOA: 01/31/2024 PCP: Rosalynn Camie CROME, MD  Brief History:  49 year old female with a history of hypertension, GERD, opioid dependence with chronic pain, IBS, anxiety/depression presenting with intentional drug overdose.  [place] Mendez.  Is currently cephalopathic.  She is unable to provide a significant history.  History is obtained from review of [facility] medical record.  [facility]olly Mendez had an argument with her significant other.  After [place] argument, [place] police were called to [facility] scene and it was reported by officers on scene that Holly Mendez took a handful of Seroquel  tablets.  EMS was activated.  Holly Mendez was lethargic.  Narcan  was given, but Holly Mendez remained somnolent in [facility] emergency department.  Poison control was contacted and recommended Holly following: Check magnesium  level and maintain between 2-2.5, keep potassium between 4-4.5, repeat EKG in 6 hours due to increased risk for torsades.  Further supportive therapies for CNS depression, QTc prolongation and seizures to include benzos and barbiturates and for hypertension -fluids and norepinephrine as needed.  Minimum observation to be 6 hours and to call for any changes in Mendez condition.   In [facility] ED, Holly Mendez had soft blood pressure.  Oxygen saturation was 94% on 1 L.  WBC 6.1, hemoglobin 12.1, platelets 234.  Sodium 142, potassium 4.1, bicarbonate 27, serum creatinine 0.73.  AST 24, ALT 28, alk phosphatase 88, total bilirubin 0.4.  UDS was positive for benzodiazepines.  EKG showed sinus rhythm with no ST or T wave changes.  UA was negative for pyuria.  Alcohol level, salicylate, and acetaminophen  levels were negative. Holly Mendez was admitted for further evaluation and treatment of a drug overdose.   Assessment/Plan:  Intentional drug overdose -Mendez overdosed on Seroquel  (medication bottle of expired (12/2023) Seroquel  was empty)  - Psychiatry consult when  Mendez is more alert and medically stable - Optimize electrolytes - Monitor for hypotension - Start IV fluids  Acute toxic encephalopathy - Currently somnolent, but awakens to tactile and protopathic stimuli - awakens and states stop fucking with me, then drifts back to sleep - Continue to monitor for CNS depression - Currently protecting her airway - UA negative for pyuria  Opioid dependence - PDMP reviewed - Oxycodone  5 mg, #120, last refill 01/08/2024, 12/05/2023 - Clonazepam  0.5 mg, #60, last refill 11/08/2023, 09/16/2023  GERD - She is chronically on Nexium  at home - Continue pantoprazole   Class III obesity - BMI 40.72 - Lifestyle modification        Family Communication:   no Family at bedside  Consultants:  none presently  Code Status:  FULL  DVT Prophylaxis:  [place] Lovenox    Procedures: As Listed in Progress Note Above  Antibiotics: None      Subjective: Mendez is somnolent but arouses to protopathic stimuli.  Review of systems not possible secondary to encephalopathy  Objective: Vitals:   02/01/24 0400 02/01/24 0448 02/01/24 0512 02/01/24 0530  BP: 99/75 104/74 112/81 108/80  Pulse: 98 93 91 94  Resp: 10     Temp:      TempSrc:      SpO2: 98% 100% 100% 100%  Weight:      Height:        Intake/Output Summary (Last 24 hours) at 02/01/2024 0713 Last data filed at 02/01/2024 0156 Gross per 24 hour  Intake 2258.22 ml  Output 450 ml  Net 1808.22 ml   Weight change:  Exam:  General:  Pt is somnolent, does not follows commands appropriately, not in acute distress HEENT: No icterus, No thrush, No neck mass, [place]/AT Cardiovascular: RRR, S1/S2, no rubs, no gallops Respiratory: Bibasilar rales.  No wheezing Abdomen: Soft/+BS, non tender, non distended, no guarding Extremities: No edema, No lymphangitis, No petechiae, No rashes, no synovitis   Data Reviewed: I have personally reviewed following labs and imaging studies Basic Metabolic  Panel: Recent Labs  Lab 01/31/24 2007 01/31/24 2013 02/01/24 0549  NA  --  140 142  K  --  3.5 4.1  CL  --  103 105  CO2  --  21* 27  GLUCOSE  --  134* 120*  BUN  --  17 12  CREATININE  --  0.76 0.73  CALCIUM   --  9.1 9.1  MG 2.0  --  2.4  PHOS  --   --  4.3   Liver Function Tests: Recent Labs  Lab 01/31/24 2013 02/01/24 0549  AST 32 24  ALT 31 28  ALKPHOS 100 88  BILITOT <0.2 0.4  PROT 7.7 7.3  ALBUMIN 4.2 4.1   No results for input(s): LIPASE, AMYLASE in [facility] last 168 hours. No results for input(s): AMMONIA in [facility] last 168 hours. Coagulation Profile: No results for input(s): INR, PROTIME in [facility] last 168 hours. CBC: Recent Labs  Lab 01/31/24 2013 02/01/24 0549  WBC 6.1 4.7  HGB 12.1 11.8*  HCT 36.1 35.9*  MCV 86.0 88.4  PLT 234 213   Cardiac Enzymes: No results for input(s): CKTOTAL, CKMB, CKMBINDEX, TROPONINI in [facility] last 168 hours. BNP: Invalid input(s): POCBNP CBG: Recent Labs  Lab 01/31/24 2016  GLUCAP 148*   HbA1C: No results for input(s): HGBA1C in [facility] last 72 hours. Urine analysis:    Component Value Date/Time   COLORURINE YELLOW 01/31/2024 2053   APPEARANCEUR CLEAR 01/31/2024 2053   LABSPEC 1.021 01/31/2024 2053   PHURINE 5.0 01/31/2024 2053   GLUCOSEU NEGATIVE 01/31/2024 2053   HGBUR NEGATIVE 01/31/2024 2053   BILIRUBINUR NEGATIVE 01/31/2024 2053   KETONESUR NEGATIVE 01/31/2024 2053   PROTEINUR 30 (A) 01/31/2024 2053   UROBILINOGEN 0.2 09/21/2014 1101   NITRITE NEGATIVE 01/31/2024 2053   LEUKOCYTESUR NEGATIVE 01/31/2024 2053   Sepsis Labs: @LABRCNTIP (procalcitonin:4,lacticidven:4) )No results found for this or any previous visit (from [facility] past 240 hours).   Scheduled Meds:  enoxaparin  (LOVENOX ) injection  40 mg Subcutaneous Q24H   Continuous Infusions:  sodium chloride  Stopped (02/01/24 0156)    Procedures/Studies: No results found.  Alm Schneider, DO  [facility]  If 7PM-7AM, please contact  night-coverage [email or web address] Password [email or web address] 02/01/2024, 7:13 AM   LOS: 0 days   "

## 2024-02-01 NOTE — Progress Notes (Signed)
 Patient admitted to [facility] for evaluation after intentional OD of Seroquel  s/p argument with SO, remains somnolent, Inpatient Care Management (ICM) conducted chart review to complete brief assessment.   Documentation from previous admit in May indicated the patient was independent without reliance on assistive devices, but has a RW, Cane and WC, and utilized Medicaid transport to get to appointments   Patient with no immediate ICM needs identified at this time. However, IPCM team will continue to follow along and monitor patient advancement through interdisciplinary progression rounds. When patient transition needs arise, please place a ICM consult to prompt IPCM follow-up.    02/01/24 1347  TOC Brief Assessment  Insurance and Status Reviewed  Patient has primary care physician Yes  Home environment has been reviewed Home with self  Prior level of function: Independent  Prior/Current Home Services No current home services  Social Drivers of Health Review SDOH reviewed no interventions necessary  Readmission risk has been reviewed Yes  Transition of care needs no transition of care needs at this time

## 2024-02-01 NOTE — ED Notes (Signed)
 IVC paperwork uploaded

## 2024-02-01 NOTE — ED Notes (Addendum)
 Pt has had a complete bed change, clothes have been placed in a belongings bag and pt is in a gown. Pts jewelry (x2 bracelets and earrings) are placed in a specimen cup for safe keeping.

## 2024-02-01 NOTE — ED Notes (Signed)
 Pts rectal temp is 95.1. pt placed on bare hugger. Dr.tat made aware.

## 2024-02-02 DIAGNOSIS — E66813 Obesity, class 3: Secondary | ICD-10-CM | POA: Diagnosis not present

## 2024-02-02 DIAGNOSIS — T50902D Poisoning by unspecified drugs, medicaments and biological substances, intentional self-harm, subsequent encounter: Secondary | ICD-10-CM | POA: Diagnosis not present

## 2024-02-02 DIAGNOSIS — G9340 Encephalopathy, unspecified: Secondary | ICD-10-CM | POA: Diagnosis not present

## 2024-02-02 LAB — COMPREHENSIVE METABOLIC PANEL WITH GFR
ALT: 27 U/L (ref 0–44)
AST: 23 U/L (ref 15–41)
Albumin: 4.1 g/dL (ref 3.5–5.0)
Alkaline Phosphatase: 99 U/L (ref 38–126)
Anion gap: 8 (ref 5–15)
BUN: 11 mg/dL (ref 6–20)
CO2: 27 mmol/L (ref 22–32)
Calcium: 9.1 mg/dL (ref 8.9–10.3)
Chloride: 108 mmol/L (ref 98–111)
Creatinine, Ser: 0.89 mg/dL (ref 0.44–1.00)
GFR, Estimated: >60 mL/min
Glucose, Bld: 99 mg/dL (ref 70–99)
Potassium: 3.9 mmol/L (ref 3.5–5.1)
Sodium: 142 mmol/L (ref 135–145)
Total Bilirubin: 0.3 mg/dL (ref 0.0–1.2)
Total Protein: 7.4 g/dL (ref 6.5–8.1)

## 2024-02-02 LAB — CBC
HCT: 35.9 % — ABNORMAL LOW (ref 36.0–46.0)
Hemoglobin: 11.6 g/dL — ABNORMAL LOW (ref 12.0–15.0)
MCH: 28.4 pg (ref 26.0–34.0)
MCHC: 32.3 g/dL (ref 30.0–36.0)
MCV: 87.8 fL (ref 80.0–100.0)
Platelets: 235 10*3/uL (ref 150–400)
RBC: 4.09 MIL/uL (ref 3.87–5.11)
RDW: 14.8 % (ref 11.5–15.5)
WBC: 7.5 10*3/uL (ref 4.0–10.5)
nRBC: 0 % (ref 0.0–0.2)

## 2024-02-02 LAB — MAGNESIUM: Magnesium: 2.3 mg/dL (ref 1.7–2.4)

## 2024-02-02 MED ORDER — LUMATEPERONE TOSYLATE 42 MG PO CAPS
42.0000 mg | ORAL_CAPSULE | Freq: Every day | ORAL | Status: DC
  Administered 2024-02-02: 42 mg via ORAL
  Filled 2024-02-02 (×4): qty 4

## 2024-02-02 MED ORDER — ALPRAZOLAM 0.25 MG PO TABS
0.2500 mg | ORAL_TABLET | Freq: Three times a day (TID) | ORAL | Status: DC | PRN
  Administered 2024-02-03 – 2024-02-04 (×2): 0.25 mg via ORAL
  Filled 2024-02-02 (×2): qty 1

## 2024-02-02 MED ORDER — CLONAZEPAM 0.5 MG PO TABS
0.5000 mg | ORAL_TABLET | Freq: Two times a day (BID) | ORAL | Status: DC
  Administered 2024-02-02 – 2024-02-04 (×5): 0.5 mg via ORAL
  Filled 2024-02-02 (×5): qty 1

## 2024-02-02 MED ORDER — DIVALPROEX SODIUM 250 MG PO DR TAB
250.0000 mg | DELAYED_RELEASE_TABLET | Freq: Every day | ORAL | Status: DC
  Administered 2024-02-02 – 2024-02-03 (×2): 250 mg via ORAL
  Filled 2024-02-02 (×2): qty 1

## 2024-02-02 NOTE — Progress Notes (Signed)
 "          PROGRESS NOTE  Holly Mendez FMW:[id number] DOB: 1975/05/12 DOA: 01/31/2024 PCP: Rosalynn Camie CROME, MD  Brief History:  49 year old female with a history of hypertension, GERD, opioid dependence with chronic pain, IBS, anxiety/depression presenting with intentional drug overdose.  The patient.  Is currently cephalopathic.  She is unable to provide a significant history.  History is obtained from review of the medical record.  The patient had an argument with her significant other.  After the argument, the police were called to the scene and it was reported by officers on scene that the patient took a handful of Seroquel  tablets.  EMS was activated.  The patient was lethargic.  Narcan  was given, but the patient remained somnolent in the emergency department.  Poison control was contacted and recommended the following: Check magnesium  level and maintain between 2-2.5, keep potassium between 4-4.5, repeat EKG in 6 hours due to increased risk for torsades.  Further supportive therapies for CNS depression, QTc prolongation and seizures to include benzos and barbiturates and for hypertension -fluids and norepinephrine as needed.  Minimum observation to be 6 hours and to call for any changes in patient condition.   In the ED, the patient had soft blood pressure.  Oxygen saturation was 94% on 1 L.  WBC 6.1, hemoglobin 12.1, platelets 234.  Sodium 142, potassium 4.1, bicarbonate 27, serum creatinine 0.73.  AST 24, ALT 28, alk phosphatase 88, total bilirubin 0.4.  UDS was positive for benzodiazepines.  EKG showed sinus rhythm with no ST or T wave changes.  UA was negative for pyuria.  Alcohol level, salicylate, and acetaminophen  levels were negative. The patient was admitted for further evaluation and treatment of a drug overdose.   Assessment/Plan:  Intentional drug overdose -Patient overdosed on Seroquel  (medication bottle of expired (12/2023) Seroquel  was empty)  - Psychiatry consult when  patient is more alert and medically stable - Optimize electrolytes - Monitor for hypotension - Started IV fluids initally - 02/02/24--patient's mental status returned back to baseline.  Awake and conversant and A&O x 3 - she is Medically Stable - consult telepsychiatry   Acute toxic encephalopathy - 02/02/24--resolved; mental status back to baseline - CT brain--neg - UA negative for pyuria - 02/02/24--patient's mental status returned back to baseline.  Awake and conversant and A&O x 3 - she is Medically Stable - consult telepsychiatry  Anxiety/depression -Restart Depakote  -Restart Caplyta  -Restart Klonopin    Opioid dependence - PDMP reviewed - Oxycodone  5 mg, #120, last refill 01/08/2024, 12/05/2023 - Clonazepam  0.5 mg, #60, last refill 11/08/2023, 09/16/2023   GERD - She is chronically on Nexium  at home - Continue pantoprazole    Class III obesity - BMI 40.72 - Lifestyle modification               Family Communication:   no Family at bedside   Consultants:  none presently   Code Status:  FULL  DVT Prophylaxis:  [place] Lovenox      Procedures: As Listed in Progress Note Above   Antibiotics: None          Subjective:  Patient denies fevers, chills, headache, chest pain, dyspnea, nausea, vomiting, diarrhea, abdominal pain, dysuria, hematuria, hematochezia, and melena.  Objective: Vitals:   02/02/24 0000 02/02/24 0400 02/02/24 0813 02/02/24 1123  BP: (!) 150/131     Pulse: (!) 120 99    Resp: 18 18    Temp: 98 F (36.7 C) (!) 97.4 F (36.3 C) 98.2 F (  36.8 C) 98.1 F (36.7 C)  TempSrc: Oral Axillary Axillary Oral  SpO2: 94% 97%    Weight:      Height:        Intake/Output Summary (Last 24 hours) at 02/02/2024 1347 Last data filed at 02/02/2024 0100 Gross per 24 hour  Intake 678.66 ml  Output --  Net 678.66 ml   Weight change:  Exam:  General:  Pt is alert, follows commands appropriately, not in acute distress HEENT: No icterus, No thrush,  No neck mass, [place]/AT Cardiovascular: RRR, S1/S2, no rubs, no gallops Respiratory: CTA bilaterally, no wheezing, no crackles, no rhonchi Abdomen: Soft/+BS, non tender, non distended, no guarding Extremities: No edema, No lymphangitis, No petechiae, No rashes, no synovitis   Data Reviewed: I have personally reviewed following labs and imaging studies Basic Metabolic Panel: Recent Labs  Lab 01/31/24 2007 01/31/24 2013 02/01/24 0549 02/02/24 0323  NA  --  140 142 142  K  --  3.5 4.1 3.9  CL  --  103 105 108  CO2  --  21* 27 27  GLUCOSE  --  134* 120* 99  BUN  --  17 12 11   CREATININE  --  0.76 0.73 0.89  CALCIUM   --  9.1 9.1 9.1  MG 2.0  --  2.4 2.3  PHOS  --   --  4.3  --    Liver Function Tests: Recent Labs  Lab 01/31/24 2013 02/01/24 0549 02/02/24 0323  AST 32 24 23  ALT 31 28 27   ALKPHOS 100 88 99  BILITOT <0.2 0.4 0.3  PROT 7.7 7.3 7.4  ALBUMIN 4.2 4.1 4.1   No results for input(s): LIPASE, AMYLASE in the last 168 hours. No results for input(s): AMMONIA in the last 168 hours. Coagulation Profile: No results for input(s): INR, PROTIME in the last 168 hours. CBC: Recent Labs  Lab 01/31/24 2013 02/01/24 0549 02/02/24 0323  WBC 6.1 4.7 7.5  HGB 12.1 11.8* 11.6*  HCT 36.1 35.9* 35.9*  MCV 86.0 88.4 87.8  PLT 234 213 235   Cardiac Enzymes: No results for input(s): CKTOTAL, CKMB, CKMBINDEX, TROPONINI in the last 168 hours. BNP: Invalid input(s): POCBNP CBG: Recent Labs  Lab 01/31/24 2016 02/01/24 0732  GLUCAP 148* 117*   HbA1C: No results for input(s): HGBA1C in the last 72 hours. Urine analysis:    Component Value Date/Time   COLORURINE YELLOW 01/31/2024 2053   APPEARANCEUR CLEAR 01/31/2024 2053   LABSPEC 1.021 01/31/2024 2053   PHURINE 5.0 01/31/2024 2053   GLUCOSEU NEGATIVE 01/31/2024 2053   HGBUR NEGATIVE 01/31/2024 2053   BILIRUBINUR NEGATIVE 01/31/2024 2053   KETONESUR NEGATIVE 01/31/2024 2053   PROTEINUR 30 (A)  01/31/2024 2053   UROBILINOGEN 0.2 09/21/2014 1101   NITRITE NEGATIVE 01/31/2024 2053   LEUKOCYTESUR NEGATIVE 01/31/2024 2053   Sepsis Labs: @LABRCNTIP (procalcitonin:4,lacticidven:4) ) Recent Results (from the past 240 hours)  Culture, blood (Routine X 2) w Reflex to ID Panel     Status: None (Preliminary result)   Collection Time: 02/01/24  8:18 AM   Specimen: BLOOD  Result Value Ref Range Status   Specimen Description BLOOD BLOOD LEFT ARM  Final   Special Requests   Final    BOTTLES DRAWN AEROBIC ONLY Blood Culture adequate volume   Culture   Final    NO GROWTH < 24 HOURS Performed at [facility], [address], [place], [place] [address]    Report Status PENDING  Incomplete  Culture, blood (Routine X  2) w Reflex to ID Panel     Status: None (Preliminary result)   Collection Time: 02/01/24  8:18 AM   Specimen: BLOOD  Result Value Ref Range Status   Specimen Description BLOOD BLOOD LEFT HAND  Final   Special Requests   Final    BOTTLES DRAWN AEROBIC ONLY Blood Culture adequate volume   Culture   Final    NO GROWTH < 24 HOURS Performed at [facility], [address], [place], [place] [address]    Report Status PENDING  Incomplete  MRSA Next Gen by PCR, Nasal     Status: None   Collection Time: 02/01/24  1:27 PM   Specimen: Nasal Mucosa; Nasal Swab  Result Value Ref Range Status   MRSA by PCR Next Gen NOT DETECTED NOT DETECTED Final    Comment: (NOTE) The GeneXpert MRSA Assay (FDA approved for NASAL specimens only), is one component of a comprehensive MRSA colonization surveillance program. It is not intended to diagnose MRSA infection nor to guide or monitor treatment for MRSA infections. Test performance is not FDA approved in patients less than 34 years old. Performed at [facility], [address] [place], [place] [address]      Scheduled Meds:  Chlorhexidine  Gluconate Cloth  6 each Topical Q0600   enoxaparin  (LOVENOX ) injection  40 mg Subcutaneous Q24H    pantoprazole  (PROTONIX ) IV  40 mg Intravenous Q12H   Continuous Infusions:  Procedures/Studies: CT HEAD WO CONTRAST ( ) Result Date: 02/01/2024 EXAM: CT HEAD WITHOUT CONTRAST 02/01/2024 06:10:55 PM TECHNIQUE: CT of the head was performed without the administration of intravenous contrast. Automated exposure control, iterative reconstruction, and/or weight based adjustment of the mA/kV was utilized to reduce the radiation dose to as low as reasonably achievable. COMPARISON: MRI head 10/30/2014. CLINICAL HISTORY: Mental status change, unknown cause. FINDINGS: BRAIN AND VENTRICLES: No acute hemorrhage. No evidence of acute infarct. No hydrocephalus. No extra-axial collection. No mass effect or midline shift. Basal ganglia calcifications. Partially empty sella. ORBITS: Left phthisis bulbi. SINUSES: No acute abnormality. SOFT TISSUES AND SKULL: No acute soft tissue abnormality. No skull fracture. LIMITATIONS/ARTIFACTS: Motion limited study. IMPRESSION: 1. No acute intracranial abnormality. Electronically signed by: Donnice Mania MD 02/01/2024 07:14 PM EST RP Workstation: [id number]    Alm Schneider, DO  [facility]  If 7PM-7AM, please contact night-coverage [email or web address] Password [email or web address] 02/02/2024, 1:47 PM   LOS: 1 day   "

## 2024-02-02 NOTE — Plan of Care (Signed)

## 2024-02-02 NOTE — Progress Notes (Signed)
 Patient attempting to leave AMA. Dr. Evonnie made aware and at bedside as well as security.

## 2024-02-02 NOTE — BH Assessment (Signed)
 Patient was deferred to IRIS for a telepsych assessment. The assigned care coordinator will provide updates regarding the scheduling of the assessment. IRIS coordinator can be reached at [phone] for further information on the timing of the telepsych evaluation.

## 2024-02-02 NOTE — Progress Notes (Signed)
 Patient compliant with taking PO medications. Significant other updated. Patient transported to [facility] [facility] with sitter. Awaiting TTS consult.

## 2024-02-02 NOTE — Plan of Care (Signed)

## 2024-02-03 DIAGNOSIS — E66813 Obesity, class 3: Secondary | ICD-10-CM | POA: Diagnosis not present

## 2024-02-03 DIAGNOSIS — T50902D Poisoning by unspecified drugs, medicaments and biological substances, intentional self-harm, subsequent encounter: Secondary | ICD-10-CM | POA: Diagnosis not present

## 2024-02-03 DIAGNOSIS — G9340 Encephalopathy, unspecified: Secondary | ICD-10-CM | POA: Diagnosis not present

## 2024-02-03 MED ORDER — DICLOFENAC SODIUM 1 % EX GEL
4.0000 g | Freq: Four times a day (QID) | CUTANEOUS | Status: DC | PRN
  Administered 2024-02-03: 4 g via TOPICAL
  Filled 2024-02-03: qty 100

## 2024-02-03 MED ORDER — PANTOPRAZOLE SODIUM 40 MG PO TBEC
40.0000 mg | DELAYED_RELEASE_TABLET | Freq: Two times a day (BID) | ORAL | Status: DC
  Administered 2024-02-03 – 2024-02-04 (×2): 40 mg via ORAL
  Filled 2024-02-03 (×2): qty 1

## 2024-02-03 NOTE — BH Assessment (Signed)
 Comprehensive Clinical Assessment (CCA) Note  02/03/2024 Holly Mendez Holly Mendez Mendez [id number] DISPOSITION: Patient meets inpatient criteria as bed placement is investigated.   The patient demonstrates the following risk factors for suicide: Chronic risk factors for suicide include: psychiatric disorder of depression. Acute risk factors for suicide include: loss (financial, interpersonal, professional). Protective factors for this patient include: coping skills. Considering these factors, the overall suicide risk at this point appears to be high. Patient is not appropriate for outpatient follow up.   PER EDP NOTE ON ARRIVAL: 49 year old female with a history of hypertension, GERD, opioid dependence with chronic pain, IBS, anxiety/depression presenting with intentional drug overdose.  The patient.  Is currently cephalopathic.  She is unable to provide a significant history.  History is obtained from review of the medical record.  The patient had an argument with her significant other.  After the argument, the police were called to the scene and it was reported by officers on scene that the patient took a handful of Seroquel  tablets.  EMS was activated.  The patient was lethargic.  Narcan  was given, but the patient remained somnolent in the emergency department.  TTS ASSESSMENT  Patient is a 49 year old female that presents this date after an intentional overdose prior to arrival. To note patient was attempting to leave and a IVC was initiated. Patient this date denies any SI, HI or AVH. Patient when asked in reference to her intent at the time of the event stated, she just wanted to rest, although would not elaborate on content. Patient states she was, just very stressed, having the responsibilities of taking care of her disabled 37 year old son and also assists her ex-husband by providing support for him also who is disabled. Patient states she also had a verbal altercation with him prior to the event.  Patient states she currently resides alone and has been on disability herself since 2006 for various medical related issues.  Patient denies any history of substance abuse or previous inpatient admissions associated with mental health. Patient states she was diagnosed with Bipolar depression soon after she started receiving her disability benefits and is currently being seen by a provider at [facility] who assists with medication interventions for symptom management. Patient states she also meets with a counselor there twice a week and after this event will be seeing that therapist 3 times a week moving forward. Patient states that her provider told her last week to DC her current medications due to chronic headaches she had been reporting as he was going to make different medication recommendations which she did and feels this may have been a contributing factor to her impulsive decision to overdose on her medications. Patient is observed to be very remorseful at the time of assessment. Patient also reports on discharge that she will be residing with her daughter for about two weeks to, get back on track. Patient appears to have services to assist with ongoing care after discharge stating she also has acquired a case manger though her current OP provider. Patient denies access to firearms or history of legal issues.   Patient is alert and oriented x 4. Patient speaks in a normal voice with clear tone and volume. Patient's memory appears to be intact with thoughts organized. Patient's mood is pleasant with affect congruent. Patient did not appear to be responding to internal stimuli.              Chief Complaint:  Chief Complaint  Patient presents with  Drug Overdose   Visit Diagnosis: Bipolar depression     CCA Screening, Triage and Referral (STR)  Patient Reported Information How did you hear about us ? Self  What Is the Reason for Your Visit/Call Today? Patient is a 49 year old  female that presented after she overdosed on medications. Patient denies any SI, HI or AVH at the time of assessment.  How Long Has This Been Causing You Problems? 1 wk - 1 month  What Do You Feel Would Help You the Most Today? Treatment for Depression or other mood problem   Have You Recently Had Any Thoughts About Hurting Yourself? Yes  Are You Planning to Commit Suicide/Harm Yourself At This time? No   Flowsheet Row ED to Hosp-Admission (Current) from 01/31/2024 in [facility] SURGICAL UNIT ED from 12/22/2023 in [facility] Emergency Department at [facility] ED to Hosp-Admission (Discharged) from 05/27/2023 in [facility] MEDICAL SURGICAL UNIT  C-SSRS RISK CATEGORY High Risk No Risk No Risk    Have you Recently Had Thoughts About Hurting Someone Holly Mendez Holly Mendez Mendez? No  Are You Planning to Harm Someone at This Time? No  Explanation: NA   Have You Used Any Alcohol or Drugs in the Past 24 Hours? No  How Long Ago Did You Use Drugs or Alcohol? NA What Did You Use and How Much? NA  Do You Currently Have a Therapist/Psychiatrist? Yes  Name of Therapist/Psychiatrist: Name of Therapist/Psychiatrist: Patient receives OP services in the form of medication managment and therapy from [facility]   Have You Been Recently Discharged From Any Office Practice or Programs? No  Explanation of Discharge From Practice/Program: NA    CCA Screening Triage Referral Assessment Type of Contact: Tele-Assessment  Telemedicine Service Delivery: Telemedicine service delivery: This service was provided via telemedicine using a 2-way, interactive audio and video technology  Is this Initial or Reassessment? Is this Initial or Reassessment?: Initial Assessment  Date Telepsych consult ordered in [facility]:  Date Telepsych consult ordered in [facility]: 02/01/24  Time Telepsych consult ordered in [facility]:  Time Telepsych consult ordered in [facility]: 1500  Location of Assessment: [facility]  Provider Location: [facility]  Assessment Services   Collateral Involvement: None at this time   Does Patient Have a Automotive Engineer Guardian? No  Legal Guardian Contact Information: NA  Copy of Legal Guardianship Form: -- (NA)  Legal Guardian Notified of Arrival: -- (NA)  Legal Guardian Notified of Pending Discharge: -- (NA)  If Minor and Not Living with Parent(s), Who has Custody? NA  Is CPS involved or ever been involved? Never  Is APS involved or ever been involved? Never   Patient Determined To Be At Risk for Harm To Self or Others Based on Review of Patient Reported Information or Presenting Complaint? Yes, for Self-Harm  Method: Plan with intent and identified person  Availability of Means: In hand or used  Intent: Intends to cause physical harm but not necessarily death  Notification Required: No need or identified person  Additional Information for Danger to Others Potential: -- (NA)  Additional Comments for Danger to Others Potential: None noted  Are There Guns or Other Weapons in Your Home? No  Types of Guns/Weapons: NA  Are These Weapons Safely Secured?                            -- (NA)  Who Could Verify You Are Able To Have These Secured: NA  Do  You Have any Outstanding Charges, Pending Court Dates, Parole/Probation? Patient denies  Contacted To Inform of Risk of Harm To Self or Others: Other: Comment (NA)    Does Patient Present under Involuntary Commitment? Yes    [place] of Residence: [place]   Patient Currently Receiving the Following Services: Medication Management; Individual Therapy   Determination of Need: Urgent (48 hours)   Options For Referral: Inpatient Hospitalization     CCA Biopsychosocial Patient Reported Schizophrenia/Schizoaffective Diagnosis in Past: No   Strengths: Patient is willing to participate in treatment   Mental Health Symptoms Depression:  Change in energy/activity; Difficulty Concentrating; Hopelessness; Worthlessness    Duration of Depressive symptoms: Duration of Depressive Symptoms: Greater than two weeks   Mania:  None   Anxiety:   Difficulty concentrating; Irritability; Restlessness   Psychosis:  None   Duration of Psychotic symptoms:    Trauma:  None   Obsessions:  None   Compulsions:  None   Inattention:  None   Hyperactivity/Impulsivity:  None   Oppositional/Defiant Behaviors:  None   Emotional Irregularity:  Chronic feelings of emptiness   Other Mood/Personality Symptoms:  None noted    Mental Status Exam Appearance and self-care  Stature:  Average   Weight:  Average weight   Clothing:  Casual (In scrubs)   Grooming:  Normal   Cosmetic use:  None   Posture/gait:  Normal   Motor activity:  Not Remarkable   Sensorium  Attention:  Normal   Concentration:  Normal   Orientation:  X5   Recall/memory:  Normal   Affect and Mood  Affect:  Anxious; Depressed   Mood:  Depressed; Anxious   Relating  Eye contact:  Normal   Facial expression:  Depressed   Attitude toward examiner:  Cooperative   Thought and Language  Speech flow: Clear and Coherent   Thought content:  Appropriate to Mood and Circumstances   Preoccupation:  None   Hallucinations:  None   Organization:  Goal-directed; Intact   [employer] of Knowledge:  Fair   Intelligence:  Average   Abstraction:  Normal   Judgement:  Fair   Brewing Technologist   Insight:  Fair   Decision Making:  Normal   Social Functioning  Social Maturity:  Responsible   Social Judgement:  Normal   Stress  Stressors:  Transitions   Coping Ability:  Human Resources Officer Deficits:  Activities of daily living   Supports:  Friends/Service system     Religion: Religion/Spirituality Are You A Religious Person?: Yes What is Your Religious Affiliation?: Christian How Might This Affect Treatment?: NA  Leisure/Recreation: Leisure / Recreation Do You Have Hobbies?:  No  Exercise/Diet: Exercise/Diet Do You Exercise?: No Have You Gained or Lost A Significant Amount of Weight in the Past Six Months?: No Do You Follow a Special Diet?: No Do You Have Any Trouble Sleeping?: No   CCA Employment/Education Employment/Work Situation: Employment / Work Situation Employment Situation: On disability Why is Patient on Disability: Physical issues associated with chronic pain How Long has Patient Been on Disability: Since 2006 Patient's Job has Been Impacted by Current Illness: No Has Patient ever Been in the [employer]?: No  Education: Education Is Patient Currently Attending School?: No Last Grade Completed: 12 Did You Attend College?: No Did You Have An Individualized Education Program (IIEP): No Did You Have Any Difficulty At School?: No Patient's Education Has Been Impacted by Current Illness: No   CCA Family/Childhood History Family and  Relationship History: Family history Marital status: Single Does patient have children?: Yes How many children?: 3 How is patient's relationship with their children?: Patient states she has a good relationship with all three children  Childhood History:  Childhood History By whom was/is the patient raised?: Both parents Did patient suffer any verbal/emotional/physical/sexual abuse as a child?: No Did patient suffer from severe childhood neglect?: No Has patient ever been sexually abused/assaulted/raped as an adolescent or adult?: No Was the patient ever a victim of a crime or a disaster?: No Witnessed domestic violence?: No Has patient been affected by domestic violence as an adult?: No       CCA Substance Use Alcohol/Drug Use: Alcohol / Drug Use Pain Medications: See MAR Prescriptions: See MAR Over the Counter: See MAR History of alcohol / drug use?: No history of alcohol / drug abuse Longest period of sobriety (when/how long): NA Negative Consequences of Use:  (NA) Withdrawal Symptoms:  (NA)                          ASAM's:  Six Dimensions of Multidimensional Assessment  Dimension 1:  Acute Intoxication and/or Withdrawal Potential:   Dimension 1:  Description of individual's past and current experiences of substance use and withdrawal: NA  Dimension 2:  Biomedical Conditions and Complications:   Dimension 2:  Description of patient's biomedical conditions and  complications: NA  Dimension 3:  Emotional, Behavioral, or Cognitive Conditions and Complications:  Dimension 3:  Description of emotional, behavioral, or cognitive conditions and complications: NA  Dimension 4:  Readiness to Change:  Dimension 4:  Description of Readiness to Change criteria: NA  Dimension 5:  Relapse, Continued use, or Continued Problem Potential:  Dimension 5:  Relapse, continued use, or continued problem potential critiera description: NA  Dimension 6:  Recovery/Living Environment:  Dimension 6:  Recovery/Iiving environment criteria description: NA  ASAM Severity Score:    ASAM Recommended Level of Treatment: ASAM Recommended Level of Treatment:  (NA)   Substance use Disorder (SUD) Substance Use Disorder (SUD)  Checklist Symptoms of Substance Use:  (NA)  Recommendations for Services/Supports/Treatments: Recommendations for Services/Supports/Treatments Recommendations For Services/Supports/Treatments: Inpatient Hospitalization  Disposition Recommendation per psychiatric provider: We recommend inpatient psychiatric hospitalization after medical hospitalization. Patient has been involuntarily committed on 02/02/2024.    DSM5 Diagnoses: Patient Active Problem List   Diagnosis Date Noted   Drug overdose 02/01/2024   Acute encephalopathy 02/01/2024   Obesity, Class III, BMI 40-49.9 (morbid obesity) (HCC) 02/01/2024   Class 2 obesity 05/30/2023   Intractable abdominal pain 05/27/2023   Cholecystitis 04/19/2023   Schizoaffective disorder (HCC) 03/23/2023   Bipolar disorder (HCC) 03/23/2023    GERD (gastroesophageal reflux disease) 03/23/2023   Altered mental status-resolved 03/23/2023   Nausea and vomiting 03/23/2023   Status post total right knee replacement 09/08/2021   Left knee pain 08/11/2021   Aftercare following right knee joint replacement surgery 05/27/2020   H/O total vaginal hysterectomy 05/31/2019   Acute on chronic pancreatitis (HCC) 09/15/2018   Abdominal discomfort 12/05/2017   Spondylosis without myelopathy or radiculopathy, lumbosacral region 04/18/2017   Vitamin D  deficiency 04/18/2017   Chronic pain syndrome 04/04/2017   Bacterial vaginosis, recurrent    Blind left eye 12/10/2014   Possiblle Anterior communicating artery aneurysm 10/30/2014   Morbid obesity (HCC) 09/24/2014   Meningitis, hx, 2016 09/21/2014   Reactive airway disease 11/21/2013   Depression 11/21/2013   Seasonal allergies 11/21/2013   Bipolar affective disorder, currently  in remission 02/04/2011   Essential hypertension 02/04/2011     Referrals to Alternative Service(s): Referred to Alternative Service(s):   Place:   Date:   Time:    Referred to Alternative Service(s):   Place:   Date:   Time:    Referred to Alternative Service(s):   Place:   Date:   Time:    Referred to Alternative Service(s):   Place:   Date:   Time:     Holly Mendez Holly Mendez Mendez, LCAS

## 2024-02-03 NOTE — Progress Notes (Signed)
 "          PROGRESS NOTE  [name] FMW:[id number] DOB: 05-28-1975 DOA: 01/31/2024 PCP: Rosalynn Camie CROME, MD  Brief History:  49 year old female with a history of hypertension, GERD, opioid dependence with chronic pain, IBS, anxiety/depression presenting with intentional drug overdose.  Holly Mendez.  Is currently cephalopathic.  She is unable to provide a significant history.  History is obtained from review of Holly medical record.  Holly Mendez had an argument with her significant other.  After Holly argument, Holly police were called to Holly scene and it was reported by officers on scene that Holly Mendez took a handful of Seroquel  tablets.  EMS was activated.  Holly Mendez was lethargic.  Narcan  was given, but Holly Mendez remained somnolent in Holly emergency department.  Poison control was contacted and recommended Holly following: Check magnesium  level and maintain between 2-2.5, keep potassium between 4-4.5, repeat EKG in 6 hours due to increased risk for torsades.  Further supportive therapies for CNS depression, QTc prolongation and seizures to include benzos and barbiturates and for hypertension -fluids and norepinephrine as needed.  Minimum observation to be 6 hours and to call for any changes in Mendez condition.   In Holly ED, Holly Mendez had soft blood pressure.  Oxygen saturation was 94% on 1 L.  WBC 6.1, hemoglobin 12.1, platelets 234.  Sodium 142, potassium 4.1, bicarbonate 27, serum creatinine 0.73.  AST 24, ALT 28, alk phosphatase 88, total bilirubin 0.4.  UDS was positive for benzodiazepines.  EKG showed sinus rhythm with no ST or T wave changes.  UA was negative for pyuria.  Alcohol level, salicylate, and acetaminophen  levels were negative. Holly Mendez was admitted for further evaluation and treatment of a drug overdose. Holly Mendez was monitored closely in Holly ICU.  She did not have any concerning dysrhythmias.  Her electrolytes were optimized.  She remained hemodynamically stable.  With  next 24 hours, her mentation gradually improved.  On 02/02/2024, her mental status returned to baseline and she was alert and oriented x 4.  She was deemed to be medically stable.  Telepsychiatry was consulted.   Assessment/Plan:   Intentional drug overdose -Mendez overdosed on Seroquel  (medication bottle of expired (12/2023) Seroquel  was empty)  - Psychiatry consult when Mendez is more alert and medically stable - Optimize electrolytes - Monitor for hypotension - Started IV fluids initally - 02/02/24--Mendez's mental status returned back to baseline.  Awake and conversant and A&O x 4 - she is Medically Stable, medically cleared - consult telepsychiatry - currently denies SI/HI   Acute toxic encephalopathy - 02/02/24--resolved; mental status back to baseline - CT brain--neg - UA negative for pyuria - 02/02/24--Mendez's mental status returned back to baseline.  Awake and conversant and A&O x 3 - she is Medically Stable, medically cleared - consult telepsychiatry   Anxiety/depression -Restart Depakote  -Restart Caplyta  -Restart Klonopin    Opioid dependence - PDMP reviewed - Oxycodone  5 mg, #120, last refill 01/08/2024, 12/05/2023 - Clonazepam  0.5 mg, #60, last refill 11/08/2023, 09/16/2023   GERD - She is chronically on Nexium  at home - Continue pantoprazole    Class III obesity - BMI 40.72 - Lifestyle modification               Family Communication:   no Family at bedside   Consultants:  none presently   Code Status:  FULL  DVT Prophylaxis:  [place] Lovenox      Procedures: As Listed in Progress Note Above   Antibiotics: None  Subjective: Mendez denies fevers, chills, headache, chest pain, dyspnea, nausea, vomiting, diarrhea, abdominal pain, dysuria, hematuria, hematochezia, and melena.   Objective: Vitals:   02/02/24 2021 02/03/24 0038 02/03/24 0420 02/03/24 1427  BP: 133/86 136/87 (!) 139/99 123/81  Pulse: 96 85 87 90  Resp: 17 17 18 18   Temp:  (!) 97.5 F (36.4 C) 98.3 F (36.8 C) (!) 97.4 F (36.3 C) (!) 97.5 F (36.4 C)  TempSrc: Oral Oral Oral Oral  SpO2: 97% 100% 100% 96%  Weight:      Height:       No intake or output data in Holly 24 hours ending 02/03/24 1705 Weight change:  Exam:  General:  Pt is alert, follows commands appropriately, not in acute distress HEENT: No icterus, No thrush, No neck mass, [place]/AT Cardiovascular: RRR, S1/S2, no rubs, no gallops Respiratory: CTA bilaterally, no wheezing, no crackles, no rhonchi Abdomen: Soft/+BS, non tender, non distended, no guarding Extremities: No edema, No lymphangitis, No petechiae, No rashes, no synovitis   Data Reviewed: I have personally reviewed following labs and imaging studies Basic Metabolic Panel: Recent Labs  Lab 01/31/24 2007 01/31/24 2013 02/01/24 0549 02/02/24 0323  NA  --  140 142 142  K  --  3.5 4.1 3.9  CL  --  103 105 108  CO2  --  21* 27 27  GLUCOSE  --  134* 120* 99  BUN  --  17 12 11   CREATININE  --  0.76 0.73 0.89  CALCIUM   --  9.1 9.1 9.1  MG 2.0  --  2.4 2.3  PHOS  --   --  4.3  --    Liver Function Tests: Recent Labs  Lab 01/31/24 2013 02/01/24 0549 02/02/24 0323  AST 32 24 23  ALT 31 28 27   ALKPHOS 100 88 99  BILITOT <0.2 0.4 0.3  PROT 7.7 7.3 7.4  ALBUMIN 4.2 4.1 4.1   No results for input(s): LIPASE, AMYLASE in Holly last 168 hours. No results for input(s): AMMONIA in Holly last 168 hours. Coagulation Profile: No results for input(s): INR, PROTIME in Holly last 168 hours. CBC: Recent Labs  Lab 01/31/24 2013 02/01/24 0549 02/02/24 0323  WBC 6.1 4.7 7.5  HGB 12.1 11.8* 11.6*  HCT 36.1 35.9* 35.9*  MCV 86.0 88.4 87.8  PLT 234 213 235   Cardiac Enzymes: No results for input(s): CKTOTAL, CKMB, CKMBINDEX, TROPONINI in Holly last 168 hours. BNP: Invalid input(s): POCBNP CBG: Recent Labs  Lab 01/31/24 2016 02/01/24 0732  GLUCAP 148* 117*   HbA1C: No results for input(s): HGBA1C in Holly last  72 hours. Urine analysis:    Component Value Date/Time   COLORURINE YELLOW 01/31/2024 2053   APPEARANCEUR CLEAR 01/31/2024 2053   LABSPEC 1.021 01/31/2024 2053   PHURINE 5.0 01/31/2024 2053   GLUCOSEU NEGATIVE 01/31/2024 2053   HGBUR NEGATIVE 01/31/2024 2053   BILIRUBINUR NEGATIVE 01/31/2024 2053   KETONESUR NEGATIVE 01/31/2024 2053   PROTEINUR 30 (A) 01/31/2024 2053   UROBILINOGEN 0.2 09/21/2014 1101   NITRITE NEGATIVE 01/31/2024 2053   LEUKOCYTESUR NEGATIVE 01/31/2024 2053   Sepsis Labs: @LABRCNTIP (procalcitonin:4,lacticidven:4) ) Recent Results (from Holly past 240 hours)  Culture, blood (Routine X 2) w Reflex to ID Panel     Status: None (Preliminary result)   Collection Time: 02/01/24  8:18 AM   Specimen: BLOOD  Result Value Ref Range Status   Specimen Description BLOOD BLOOD LEFT ARM  Final   Special Requests   Final  BOTTLES DRAWN AEROBIC ONLY Blood Culture adequate volume   Culture   Final    NO GROWTH 2 DAYS Performed at [facility], [address], [place], [place] [address]    Report Status PENDING  Incomplete  Culture, blood (Routine X 2) w Reflex to ID Panel     Status: None (Preliminary result)   Collection Time: 02/01/24  8:18 AM   Specimen: BLOOD  Result Value Ref Range Status   Specimen Description BLOOD BLOOD LEFT HAND  Final   Special Requests   Final    BOTTLES DRAWN AEROBIC ONLY Blood Culture adequate volume   Culture   Final    NO GROWTH 2 DAYS Performed at [facility], [address], [place], [place] [address]    Report Status PENDING  Incomplete  MRSA Next Gen by PCR, Nasal     Status: None   Collection Time: 02/01/24  1:27 PM   Specimen: Nasal Mucosa; Nasal Swab  Result Value Ref Range Status   MRSA by PCR Next Gen NOT DETECTED NOT DETECTED Final    Comment: (NOTE) Holly GeneXpert MRSA Assay (FDA approved for NASAL specimens only), is one component of a comprehensive MRSA colonization surveillance program. It is not intended to diagnose  MRSA infection nor to guide or monitor treatment for MRSA infections. Test performance is not FDA approved in patients less than 18 years old. Performed at [facility], [address] [place], [place] [address]      Scheduled Meds:  Chlorhexidine  Gluconate Cloth  6 each Topical Q0600   clonazePAM   0.5 mg Oral BID   divalproex   250 mg Oral QHS   enoxaparin  (LOVENOX ) injection  40 mg Subcutaneous Q24H   lumateperone  tosylate  42 mg Oral Daily   pantoprazole   40 mg Oral BID   Continuous Infusions:  Procedures/Studies: CT HEAD WO CONTRAST ( ) Result Date: 02/01/2024 EXAM: CT HEAD WITHOUT CONTRAST 02/01/2024 06:10:55 PM TECHNIQUE: CT of Holly head was performed without Holly administration of intravenous contrast. Automated exposure control, iterative reconstruction, and/or weight based adjustment of the mA/kV was utilized to reduce Holly radiation dose to as low as reasonably achievable. COMPARISON: MRI head 10/30/2014. CLINICAL HISTORY: Mental status change, unknown cause. FINDINGS: BRAIN AND VENTRICLES: No acute hemorrhage. No evidence of acute infarct. No hydrocephalus. No extra-axial collection. No mass effect or midline shift. Basal ganglia calcifications. Partially empty sella. ORBITS: Left phthisis bulbi. SINUSES: No acute abnormality. SOFT TISSUES AND SKULL: No acute soft tissue abnormality. No skull fracture. LIMITATIONS/ARTIFACTS: Motion limited study. IMPRESSION: 1. No acute intracranial abnormality. Electronically signed by: Donnice Mania MD 02/01/2024 07:14 PM EST RP Workstation: [id number]    Alm Schneider, DO  [facility]  If 7PM-7AM, please contact night-coverage [email or web address] Password [email or web address] 02/03/2024, 5:05 PM   LOS: 2 days   "

## 2024-02-03 NOTE — Plan of Care (Signed)

## 2024-02-03 NOTE — Plan of Care (Signed)
   Problem: Education: Goal: Knowledge of General Education information will improve Description Including pain rating scale, medication(s)/side effects and non-pharmacologic comfort measures Outcome: Progressing

## 2024-02-03 NOTE — BH Assessment (Signed)
 Per IRIS coordinator patient was admitted medically and IRIS provider advised that they would be unable to see patients that are inpatient. They deferred patient back to in house provider.

## 2024-02-04 ENCOUNTER — Other Ambulatory Visit: Payer: Self-pay

## 2024-02-04 ENCOUNTER — Inpatient Hospital Stay (HOSPITAL_COMMUNITY)
Admission: AD | Admit: 2024-02-04 | Discharge: 2024-02-07 | DRG: 885 | Disposition: A | Discharge status: Home / Self Care | Payer: MEDICAID | Source: Intra-hospital | Attending: Student in an Organized Health Care Education/Training Program | Admitting: Student in an Organized Health Care Education/Training Program

## 2024-02-04 ENCOUNTER — Encounter (HOSPITAL_COMMUNITY): Payer: Self-pay | Admitting: Psychiatry

## 2024-02-04 ENCOUNTER — Emergency Department (HOSPITAL_COMMUNITY)
Admission: EM | Admit: 2024-02-04 | Discharge: 2024-02-04 | Disposition: A | Discharge status: Other Acute Inpatient Hospital | Payer: MEDICAID

## 2024-02-04 ENCOUNTER — Telehealth: Payer: Self-pay | Admitting: Family Medicine

## 2024-02-04 ENCOUNTER — Encounter (HOSPITAL_COMMUNITY): Payer: Self-pay

## 2024-02-04 DIAGNOSIS — Z7951 Long term (current) use of inhaled steroids: Secondary | ICD-10-CM | POA: Diagnosis not present

## 2024-02-04 DIAGNOSIS — Z9049 Acquired absence of other specified parts of digestive tract: Secondary | ICD-10-CM | POA: Diagnosis not present

## 2024-02-04 DIAGNOSIS — K219 Gastro-esophageal reflux disease without esophagitis: Secondary | ICD-10-CM | POA: Diagnosis present

## 2024-02-04 DIAGNOSIS — Z79899 Other long term (current) drug therapy: Secondary | ICD-10-CM

## 2024-02-04 DIAGNOSIS — Z5989 Other problems related to housing and economic circumstances: Secondary | ICD-10-CM | POA: Diagnosis not present

## 2024-02-04 DIAGNOSIS — R45851 Suicidal ideations: Secondary | ICD-10-CM | POA: Diagnosis present

## 2024-02-04 DIAGNOSIS — Z5982 Transportation insecurity: Secondary | ICD-10-CM | POA: Diagnosis not present

## 2024-02-04 DIAGNOSIS — Z888 Allergy status to other drugs, medicaments and biological substances status: Secondary | ICD-10-CM | POA: Diagnosis not present

## 2024-02-04 DIAGNOSIS — F322 Major depressive disorder, single episode, severe without psychotic features: Principal | ICD-10-CM | POA: Diagnosis present

## 2024-02-04 DIAGNOSIS — Z62810 Personal history of physical and sexual abuse in childhood: Secondary | ICD-10-CM

## 2024-02-04 DIAGNOSIS — Z96651 Presence of right artificial knee joint: Secondary | ICD-10-CM | POA: Diagnosis present

## 2024-02-04 DIAGNOSIS — F17210 Nicotine dependence, cigarettes, uncomplicated: Secondary | ICD-10-CM | POA: Diagnosis present

## 2024-02-04 DIAGNOSIS — E7800 Pure hypercholesterolemia, unspecified: Secondary | ICD-10-CM | POA: Diagnosis present

## 2024-02-04 DIAGNOSIS — Z818 Family history of other mental and behavioral disorders: Secondary | ICD-10-CM

## 2024-02-04 DIAGNOSIS — Z90710 Acquired absence of both cervix and uterus: Secondary | ICD-10-CM

## 2024-02-04 DIAGNOSIS — F99 Mental disorder, not otherwise specified: Secondary | ICD-10-CM | POA: Insufficient documentation

## 2024-02-04 DIAGNOSIS — Z9851 Tubal ligation status: Secondary | ICD-10-CM | POA: Diagnosis not present

## 2024-02-04 DIAGNOSIS — I10 Essential (primary) hypertension: Secondary | ICD-10-CM | POA: Diagnosis present

## 2024-02-04 DIAGNOSIS — H5462 Unqualified visual loss, left eye, normal vision right eye: Secondary | ICD-10-CM | POA: Diagnosis present

## 2024-02-04 DIAGNOSIS — Z970 Presence of artificial eye: Secondary | ICD-10-CM

## 2024-02-04 DIAGNOSIS — Z91410 Personal history of adult physical and sexual abuse: Secondary | ICD-10-CM | POA: Diagnosis not present

## 2024-02-04 DIAGNOSIS — G8929 Other chronic pain: Secondary | ICD-10-CM | POA: Diagnosis present

## 2024-02-04 DIAGNOSIS — M797 Fibromyalgia: Secondary | ICD-10-CM | POA: Diagnosis present

## 2024-02-04 DIAGNOSIS — Z5941 Food insecurity: Secondary | ICD-10-CM | POA: Diagnosis not present

## 2024-02-04 DIAGNOSIS — Z881 Allergy status to other antibiotic agents status: Secondary | ICD-10-CM | POA: Diagnosis not present

## 2024-02-04 DIAGNOSIS — F319 Bipolar disorder, unspecified: Secondary | ICD-10-CM | POA: Diagnosis present

## 2024-02-04 LAB — CBC
HCT: 40.3 % (ref 36.0–46.0)
Hemoglobin: 13.6 g/dL (ref 12.0–15.0)
MCH: 29.1 pg (ref 26.0–34.0)
MCHC: 33.7 g/dL (ref 30.0–36.0)
MCV: 86.3 fL (ref 80.0–100.0)
Platelets: 283 10*3/uL (ref 150–400)
RBC: 4.67 MIL/uL (ref 3.87–5.11)
RDW: 14.3 % (ref 11.5–15.5)
WBC: 7.3 10*3/uL (ref 4.0–10.5)
nRBC: 0 % (ref 0.0–0.2)

## 2024-02-04 LAB — URINE DRUG SCREEN
Amphetamines: NEGATIVE
Barbiturates: NEGATIVE
Benzodiazepines: POSITIVE — AB
Cocaine: NEGATIVE
Fentanyl: NEGATIVE
Methadone Scn, Ur: NEGATIVE
Opiates: NEGATIVE
Tetrahydrocannabinol: NEGATIVE

## 2024-02-04 LAB — COMPREHENSIVE METABOLIC PANEL WITH GFR
ALT: 27 U/L (ref 0–44)
AST: 28 U/L (ref 15–41)
Albumin: 4.6 g/dL (ref 3.5–5.0)
Alkaline Phosphatase: 121 U/L (ref 38–126)
Anion gap: 11 (ref 5–15)
BUN: 13 mg/dL (ref 6–20)
CO2: 26 mmol/L (ref 22–32)
Calcium: 9.8 mg/dL (ref 8.9–10.3)
Chloride: 99 mmol/L (ref 98–111)
Creatinine, Ser: 0.85 mg/dL (ref 0.44–1.00)
GFR, Estimated: >60 mL/min
Glucose, Bld: 119 mg/dL — ABNORMAL HIGH (ref 70–99)
Potassium: 4.2 mmol/L (ref 3.5–5.1)
Sodium: 137 mmol/L (ref 135–145)
Total Bilirubin: 0.4 mg/dL (ref 0.0–1.2)
Total Protein: 8.8 g/dL — ABNORMAL HIGH (ref 6.5–8.1)

## 2024-02-04 LAB — ETHANOL: Alcohol, Ethyl (B): <15 mg/dL

## 2024-02-04 MED ORDER — DIPHENHYDRAMINE HCL 25 MG PO CAPS: 50.0000 mg | ORAL_CAPSULE | Freq: Three times a day (TID) | ORAL | Status: DC | PRN

## 2024-02-04 MED ORDER — PANTOPRAZOLE SODIUM 40 MG PO TBEC
40.0000 mg | DELAYED_RELEASE_TABLET | Freq: Two times a day (BID) | ORAL | Status: DC
  Administered 2024-02-04 – 2024-02-05 (×2): 40 mg via ORAL
  Filled 2024-02-04 (×2): qty 1

## 2024-02-04 MED ORDER — LUMATEPERONE TOSYLATE 42 MG PO CAPS
42.0000 mg | ORAL_CAPSULE | Freq: Every day | ORAL | Status: DC
  Administered 2024-02-05: 42 mg via ORAL
  Filled 2024-02-04: qty 1

## 2024-02-04 MED ORDER — DICLOFENAC SODIUM 1 % EX GEL
4.0000 g | Freq: Four times a day (QID) | CUTANEOUS | Status: DC | PRN
  Administered 2024-02-05 – 2024-02-06 (×2): 4 g via TOPICAL
  Filled 2024-02-04: qty 100

## 2024-02-04 MED ORDER — ALUM & MAG HYDROXIDE-SIMETH 200-200-20 MG/5ML PO SUSP: 30.0000 mL | ORAL | Status: DC | PRN

## 2024-02-04 MED ORDER — DIPHENHYDRAMINE HCL 50 MG/ML IJ SOLN: 50.0000 mg | Freq: Three times a day (TID) | INTRAMUSCULAR | Status: DC | PRN

## 2024-02-04 MED ORDER — ACETAMINOPHEN 325 MG PO TABS
650.0000 mg | ORAL_TABLET | Freq: Four times a day (QID) | ORAL | Status: DC | PRN
  Administered 2024-02-05 – 2024-02-06 (×4): 650 mg via ORAL
  Filled 2024-02-04 (×4): qty 2

## 2024-02-04 MED ORDER — LORAZEPAM 2 MG/ML IJ SOLN: 2.0000 mg | Freq: Three times a day (TID) | INTRAMUSCULAR | Status: DC | PRN

## 2024-02-04 MED ORDER — HYDROXYZINE HCL 25 MG PO TABS
25.0000 mg | ORAL_TABLET | Freq: Three times a day (TID) | ORAL | Status: DC | PRN
  Administered 2024-02-04 – 2024-02-06 (×3): 25 mg via ORAL
  Filled 2024-02-04 (×3): qty 1

## 2024-02-04 MED ORDER — DIVALPROEX SODIUM 250 MG PO DR TAB
250.0000 mg | DELAYED_RELEASE_TABLET | Freq: Every day | ORAL | Status: DC
  Administered 2024-02-04 – 2024-02-06 (×3): 250 mg via ORAL
  Filled 2024-02-04 (×3): qty 1

## 2024-02-04 MED ORDER — HALOPERIDOL LACTATE 5 MG/ML IJ SOLN: 5.0000 mg | Freq: Three times a day (TID) | INTRAMUSCULAR | Status: DC | PRN

## 2024-02-04 MED ORDER — CHLORHEXIDINE GLUCONATE CLOTH 2 % EX PADS
6.0000 | MEDICATED_PAD | Freq: Every day | CUTANEOUS | Status: DC
  Filled 2024-02-04 (×4): qty 6

## 2024-02-04 MED ORDER — MAGNESIUM HYDROXIDE 400 MG/5ML PO SUSP: 30.0000 mL | Freq: Every day | ORAL | Status: DC | PRN

## 2024-02-04 MED ORDER — HALOPERIDOL LACTATE 5 MG/ML IJ SOLN: 10.0000 mg | Freq: Three times a day (TID) | INTRAMUSCULAR | Status: DC | PRN

## 2024-02-04 MED ORDER — TRAZODONE HCL 50 MG PO TABS
50.0000 mg | ORAL_TABLET | Freq: Every evening | ORAL | Status: DC | PRN
  Administered 2024-02-04 – 2024-02-06 (×3): 50 mg via ORAL
  Filled 2024-02-04 (×3): qty 1

## 2024-02-04 MED ORDER — HALOPERIDOL 5 MG PO TABS: 5.0000 mg | ORAL_TABLET | Freq: Three times a day (TID) | ORAL | Status: DC | PRN

## 2024-02-04 NOTE — TOC Transition Note (Signed)
 Transition of Care [facility]) - Discharge Note   Patient Details  Name: Holly Mendez MRN: [id number] Date of Birth: 13-Nov-1975  Transition of Care [facility] CM/SW Contact:  Sharlyne Stabs, RN Phone Number: 02/04/2024, 12:37 PM   Clinical Narrative:   Patient accepted at [facility] bed 305. Transfer information sent to team. IPCM signing off.    Final next level of care: Psychiatric Hospital Barriers to Discharge: Barriers Resolved   Patient Goals and CMS Choice Patient states their goals for this hospitalization and ongoing recovery are:: Needs Inpatient PSY bed.  Discharge Plan and Services Additional resources added to the After Visit Summary for                  Social Drivers of Health (SDOH) Interventions SDOH Screenings   Food Insecurity: Patient Unable To Answer (02/01/2024)  Housing: Unknown (02/01/2024)  Transportation Needs: Patient Unable To Answer (02/01/2024)  Utilities: Patient Unable To Answer (02/01/2024)  Depression (PHQ2-9): Medium Risk (12/19/2023)  Social Connections: Socially Isolated (04/19/2023)  Tobacco Use: High Risk (01/31/2024)     Readmission Risk Interventions    05/28/2023    9:03 AM  Readmission Risk Prevention Plan  Transportation Screening Complete  HRI or Home Care Consult Complete  Social Work Consult for Recovery Care Planning/Counseling Complete  Palliative Care Screening Not Applicable  Medication Review Oceanographer) Complete

## 2024-02-04 NOTE — Plan of Care (Signed)

## 2024-02-04 NOTE — Progress Notes (Signed)
" °  Pt admitted today following a OD on Seroquel . . Pt reports she did not made a mistake an took to much medication, she did not want to hurt herself. Skin check WNL  Pr denies SI/HI/AVH.  Pt denies pain and vitals stable. Pt offered food and hydration.   Pt oriented to unit and Q 15 min checks.       "

## 2024-02-04 NOTE — Discharge Summary (Signed)
 " Physician Discharge Summary   Patient: Holly Mendez MRN: [id number] DOB: 24-Nov-1975  Admit date:     01/31/2024  Discharge date: 02/04/24  Discharge Physician: Alm Katharin Schneider   PCP: Rosalynn Camie CROME, MD   Recommendations at discharge:   Please follow up with primary care provider within 1-2 weeks  Please repeat BMP and CBC in one week   Hospital Course: 49 year old female with a history of hypertension, GERD, opioid dependence with chronic pain, IBS, anxiety/depression presenting with intentional drug overdose.  The patient.  Is currently cephalopathic.  She is unable to provide a significant history.  History is obtained from review of the medical record.  The patient had an argument with her significant other.  After the argument, the police were called to the scene and it was reported by officers on scene that the patient took a handful of Seroquel  tablets.  EMS was activated.  The patient was lethargic.  Narcan  was given, but the patient remained somnolent in the emergency department.  Poison control was contacted and recommended the following: Check magnesium  level and maintain between 2-2.5, keep potassium between 4-4.5, repeat EKG in 6 hours due to increased risk for torsades.  Further supportive therapies for CNS depression, QTc prolongation and seizures to include benzos and barbiturates and for hypertension -fluids and norepinephrine as needed.  Minimum observation to be 6 hours and to call for any changes in patient condition.   In the ED, the patient had soft blood pressure.  Oxygen saturation was 94% on 1 L.  WBC 6.1, hemoglobin 12.1, platelets 234.  Sodium 142, potassium 4.1, bicarbonate 27, serum creatinine 0.73.  AST 24, ALT 28, alk phosphatase 88, total bilirubin 0.4.  UDS was positive for benzodiazepines.  EKG showed sinus rhythm with no ST or T wave changes.  UA was negative for pyuria.  Alcohol level, salicylate, and acetaminophen  levels were negative. The patient was admitted  for further evaluation and treatment of a drug overdose. The patient was monitored closely in the ICU.  She did not have any concerning dysrhythmias.  Her electrolytes were optimized.  She remained hemodynamically stable.  With next 24 hours, her mentation gradually improved.  On 02/02/2024, her mental status returned to baseline and she was alert and oriented x 4.  She was deemed to be medically stable.  Telepsychiatry was consulted. They recommended transfer to inpatient [facility].  Assessment and Plan:  Intentional drug overdose -Patient overdosed on Seroquel  (medication bottle of expired (12/2023) Seroquel  was empty)  - Psychiatry consult when patient is more alert and medically stable - Optimize electrolytes - Monitor for hypotension - Started IV fluids initally - 02/02/24--patient's mental status returned back to baseline.  Awake and conversant and A&O x 4 - she is Medically Stable, medically cleared - consult telepsychiatry>>recommended inpatient [facility] - currently denies SI/HI   Acute toxic encephalopathy - 02/02/24--resolved; mental status back to baseline - CT brain--neg - UA negative for pyuria - 02/02/24--patient's mental status returned back to baseline.  Awake and conversant and A&O x 3 - she is Medically Stable, medically cleared - consult telepsychiatry   Anxiety/depression -Restarted Depakote  -Restarted Caplyta  -Restarted Klonopin    Opioid dependence - PDMP reviewed - Oxycodone  5 mg, #120, last refill 01/08/2024, 12/05/2023 - Clonazepam  0.5 mg, #60, last refill 11/08/2023, 09/16/2023   GERD - She is chronically on Nexium  at home - Continue pantoprazole    Class III obesity - BMI 40.72 - Lifestyle modification   Consultants: TTS Procedures performed: none  Disposition: [facility]  Diet recommendation:  Regular diet DISCHARGE MEDICATION: Allergies as of 02/04/2024       Reactions   Lyrica  [pregabalin ] Swelling   Swelling of hands and feet   Zithromax [azithromycin] Other  (See Comments)   Severe stomach cramping   Celebrex  [celecoxib ] Rash   Nabumetone  Swelling, Dermatitis, Rash   Naproxen  Swelling   Norvasc  [amlodipine  Besylate] Other (See Comments)   Mouth irritation         Medication List     STOP taking these medications    cloNIDine  0.1 MG tablet Commonly known as: CATAPRES    estradiol  1 MG tablet Commonly known as: ESTRACE    metoCLOPramide  10 MG tablet Commonly known as: REGLAN    QUEtiapine  200 MG tablet Commonly known as: SEROquel        TAKE these medications    cetirizine  10 MG tablet Commonly known as: ZYRTEC  Take 1 tablet (10 mg total) by mouth in the morning.   clonazePAM  0.5 MG tablet Commonly known as: KLONOPIN  Take 0.5 mg by mouth 2 (two) times daily.   diclofenac  Sodium 1 % Gel Commonly known as: Voltaren  Arthritis Pain Apply 2 g topically 4 (four) times daily.   divalproex  250 MG DR tablet Commonly known as: Depakote  Take 1 tablet (250 mg total) by mouth at bedtime.   esomeprazole  40 MG capsule Commonly known as: NEXIUM  TAKE 1 CAPSULE(40 MG) BY MOUTH TWICE DAILY BEFORE A MEAL   lumateperone  tosylate 42 MG capsule Commonly known as: Caplyta  Take 1 capsule (42 mg total) by mouth daily.   Narcan  4 MG/0.1ML Liqd nasal spray kit Generic drug: naloxone  Place 1 spray into the nose daily as needed (overdose).   ondansetron  4 MG disintegrating tablet Commonly known as: ZOFRAN -ODT Take 1 tablet (4 mg total) by mouth 3 (three) times daily before meals. Prn nausea   oxyCODONE  5 MG immediate release tablet Commonly known as: Oxy IR/ROXICODONE  Take 5 mg by mouth 4 (four) times daily as needed.   polyethylene glycol powder 17 GM/SCOOP powder Commonly known as: GLYCOLAX /MIRALAX  Take 17 g by mouth 2 (two) times daily as needed.   Symbicort  80-4.5 MCG/ACT inhaler Generic drug: budesonide -formoterol  INHALE 2 PUFFS INTO THE LUNGS TWICE DAILY AS NEEDED.   Ventolin  HFA 108 (90 Base) MCG/ACT inhaler Generic drug:  albuterol  INHALE TWO PUFFS INTO THE LUNGS EVERY 6 HOURS AS NEEDED WHEEZING OR SHORTNESS OF BREATH        Discharge Exam: Filed Weights   01/31/24 2003  Weight: 111 kg   HEENT:  [place]/AT, No thrush, no icterus CV:  RRR, no rub, no S3, no S4 Lung:  CTA, no wheeze, no rhonchi Abd:  soft/+BS, NT Ext:  No edema, no lymphangitis, no synovitis, no rash   Condition at discharge: stable  The results of significant diagnostics from this hospitalization (including imaging, microbiology, ancillary and laboratory) are listed below for reference.   Imaging Studies: CT HEAD WO CONTRAST ( ) Result Date: 02/01/2024 EXAM: CT HEAD WITHOUT CONTRAST 02/01/2024 06:10:55 PM TECHNIQUE: CT of the head was performed without the administration of intravenous contrast. Automated exposure control, iterative reconstruction, and/or weight based adjustment of the mA/kV was utilized to reduce the radiation dose to as low as reasonably achievable. COMPARISON: MRI head 10/30/2014. CLINICAL HISTORY: Mental status change, unknown cause. FINDINGS: BRAIN AND VENTRICLES: No acute hemorrhage. No evidence of acute infarct. No hydrocephalus. No extra-axial collection. No mass effect or midline shift. Basal ganglia calcifications. Partially empty sella. ORBITS: Left phthisis bulbi. SINUSES: No acute abnormality. SOFT TISSUES AND SKULL:  No acute soft tissue abnormality. No skull fracture. LIMITATIONS/ARTIFACTS: Motion limited study. IMPRESSION: 1. No acute intracranial abnormality. Electronically signed by: Donnice Mania MD 02/01/2024 07:14 PM EST RP Workstation: [id number]    Microbiology: Results for orders placed or performed during the hospital encounter of 01/31/24  Culture, blood (Routine X 2) w Reflex to ID Panel     Status: None (Preliminary result)   Collection Time: 02/01/24  8:18 AM   Specimen: BLOOD  Result Value Ref Range Status   Specimen Description BLOOD BLOOD LEFT ARM  Final   Special Requests   Final     BOTTLES DRAWN AEROBIC ONLY Blood Culture adequate volume   Culture   Final    NO GROWTH 3 DAYS Performed at [facility], [address], [place], [place] [address]    Report Status PENDING  Incomplete  Culture, blood (Routine X 2) w Reflex to ID Panel     Status: None (Preliminary result)   Collection Time: 02/01/24  8:18 AM   Specimen: BLOOD  Result Value Ref Range Status   Specimen Description BLOOD BLOOD LEFT HAND  Final   Special Requests   Final    BOTTLES DRAWN AEROBIC ONLY Blood Culture adequate volume   Culture   Final    NO GROWTH 3 DAYS Performed at [facility], [address] [place], [place] [address]    Report Status PENDING  Incomplete  MRSA Next Gen by PCR, Nasal     Status: None   Collection Time: 02/01/24  1:27 PM   Specimen: Nasal Mucosa; Nasal Swab  Result Value Ref Range Status   MRSA by PCR Next Gen NOT DETECTED NOT DETECTED Final    Comment: (NOTE) The GeneXpert MRSA Assay (FDA approved for NASAL specimens only), is one component of a comprehensive MRSA colonization surveillance program. It is not intended to diagnose MRSA infection nor to guide or monitor treatment for MRSA infections. Test performance is not FDA approved in patients less than 67 years old. Performed at [facility], [address] [place], [place] [address]    *Note: Due to a large number of results and/or encounters for the requested time period, some results have not been displayed. A complete set of results can be found in Results Review.    Labs: CBC: Recent Labs  Lab 01/31/24 2013 02/01/24 0549 02/02/24 0323  WBC 6.1 4.7 7.5  HGB 12.1 11.8* 11.6*  HCT 36.1 35.9* 35.9*  MCV 86.0 88.4 87.8  PLT 234 213 235   Basic Metabolic Panel: Recent Labs  Lab 01/31/24 2007 01/31/24 2013 02/01/24 0549 02/02/24 0323  NA  --  140 142 142  K  --  3.5 4.1 3.9  CL  --  103 105 108  CO2  --  21* 27 27  GLUCOSE  --  134* 120* 99  BUN  --  17 12 11   CREATININE  --  0.76 0.73 0.89   CALCIUM   --  9.1 9.1 9.1  MG 2.0  --  2.4 2.3  PHOS  --   --  4.3  --    Liver Function Tests: Recent Labs  Lab 01/31/24 2013 02/01/24 0549 02/02/24 0323  AST 32 24 23  ALT 31 28 27   ALKPHOS 100 88 99  BILITOT <0.2 0.4 0.3  PROT 7.7 7.3 7.4  ALBUMIN 4.2 4.1 4.1   CBG: Recent Labs  Lab 01/31/24 2016 02/01/24 0732  GLUCAP 148* 117*    Discharge time spent: greater than 30  minutes.  Signed: Alm Schneider, MD [facility] 02/04/2024 "

## 2024-02-04 NOTE — Telephone Encounter (Signed)
**  After Hours/ Emergency Line Call**  Received a page to call [phone]) - [phone].  Patient: Holly Mendez  Caller: Self  Confirmed name & DOB of patient with caller  Subjective:     The patient is currently admitted at [facility] Per chart review she is awaiting psych placement and has been IVC'd and she wanted to speak with Dr. Rosalynn about this I advised this is not something we can typically intervene on but I would forward this to Dr. Rosalynn  I would not recommend to the admitting team that this hold her from continuing the current plan of care  -- Will forward to PCP.  Payton Coward, MD [facility], PGY-3

## 2024-02-04 NOTE — TOC Progression Note (Signed)
 Transition of Care [facility]) - Progression Note    Patient Details  Name: Holly Mendez MRN: [id number] Date of Birth: Sep 23, 1975  Transition of Care [facility]) CM/SW Contact  Sharlyne Stabs, RN Phone Number: 02/04/2024, 10:24 AM  Clinical Narrative:   Per Counselor note patient need inpatient psy hospital. Referrals sent in the hub. ICPM following for offers.     Expected Discharge Plan:  (Inpatient PSY) Barriers to Discharge: Psych Bed not available    Expected Discharge Plan and Services  Inpatient PSY     Social Drivers of Health (SDOH) Interventions SDOH Screenings   Food Insecurity: Patient Unable To Answer (02/01/2024)  Housing: Unknown (02/01/2024)  Transportation Needs: Patient Unable To Answer (02/01/2024)  Utilities: Patient Unable To Answer (02/01/2024)  Depression (PHQ2-9): Medium Risk (12/19/2023)  Social Connections: Socially Isolated (04/19/2023)  Tobacco Use: High Risk (01/31/2024)    Readmission Risk Interventions    05/28/2023    9:03 AM  Readmission Risk Prevention Plan  Transportation Screening Complete  HRI or Home Care Consult Complete  Social Work Consult for Recovery Care Planning/Counseling Complete  Palliative Care Screening Not Applicable  Medication Review Oceanographer) Complete

## 2024-02-04 NOTE — ED Triage Notes (Signed)
 Pt Holly Mendez PD with IVC paperwork. Pt is denying HI/SI. Pt states that she was recently admitted for SI, and reports not being SI.

## 2024-02-04 NOTE — ED Provider Notes (Signed)
 " [place] EMERGENCY DEPARTMENT AT [facility] Provider Note   CSN: [id number] Arrival date & time: 02/04/24  1859     Patient presents with: ivc   Holly Mendez is a 49 y.o. female.   49 year old presented emergency department by EMS with IVC paperwork.  It appears she was admitted at [facility] for suicidal ideation with overdose.  Discharged today was post to go to behavioral health.  Was brought to the wrong facility per nursing staff.  Patient has no specific complaints currently.  Brought with IVC paperwork        Prior to Admission medications  Medication Sig Start Date End Date Taking? Authorizing Provider  cetirizine  (ZYRTEC ) 10 MG tablet Take 1 tablet (10 mg total) by mouth in the morning. 11/21/23   Rosalynn Camie CROME, MD  clonazePAM  (KLONOPIN ) 0.5 MG tablet Take 0.5 mg by mouth 2 (two) times daily. 07/20/23   [provider]  diclofenac  Sodium (VOLTAREN  ARTHRITIS PAIN) 1 % GEL Apply 2 g topically 4 (four) times daily. 06/13/23   Rosalynn Camie CROME, MD  divalproex  (DEPAKOTE ) 250 MG DR tablet Take 1 tablet (250 mg total) by mouth at bedtime. 11/21/23   Rosalynn Camie CROME, MD  esomeprazole  (NEXIUM ) 40 MG capsule TAKE 1 CAPSULE(40 MG) BY MOUTH TWICE DAILY BEFORE A MEAL 12/10/23   May, Deanna J, NP  lumateperone  tosylate (CAPLYTA ) 42 MG capsule Take 1 capsule (42 mg total) by mouth daily. 11/21/23   Rosalynn Camie CROME, MD  NARCAN  4 MG/0.1ML LIQD nasal spray kit Place 1 spray into the nose daily as needed (overdose). 11/21/23   Rosalynn Camie CROME, MD  ondansetron  (ZOFRAN -ODT) 4 MG disintegrating tablet Take 1 tablet (4 mg total) by mouth 3 (three) times daily before meals. Prn nausea 11/21/23   Rosalynn Camie CROME, MD  oxyCODONE  (OXY IR/ROXICODONE ) 5 MG immediate release tablet Take 5 mg by mouth 4 (four) times daily as needed. 12/04/23   [provider]  polyethylene glycol powder (GLYCOLAX /MIRALAX ) 17 GM/SCOOP powder Take 17 g by mouth 2 (two) times daily as needed. 07/09/23    Rosalynn Camie CROME, MD  SYMBICORT  80-4.5 MCG/ACT inhaler INHALE 2 PUFFS INTO THE LUNGS TWICE DAILY AS NEEDED. 11/21/23   Rosalynn Camie CROME, MD  VENTOLIN  HFA 108 [phone] Base) MCG/ACT inhaler INHALE TWO PUFFS INTO THE LUNGS EVERY 6 HOURS AS NEEDED WHEEZING OR SHORTNESS OF BREATH 11/21/23   Rosalynn Camie CROME, MD    Allergies: Lyrica  [pregabalin ], Zithromax [azithromycin], Celebrex  [celecoxib ], Nabumetone , Naproxen , and Norvasc  [amlodipine  besylate]    Review of Systems  Updated Vital Signs BP (!) 146/105   Pulse (!) 101   Temp 98.2 F (36.8 C) (Oral)   Resp 18   LMP 08/31/2018   SpO2 98%   Physical Exam Vitals and nursing note reviewed.  Constitutional:      General: She is not in acute distress.    Appearance: She is obese. She is not toxic-appearing.  HENT:     Nose: Nose normal.  Eyes:     Conjunctiva/sclera: Conjunctivae normal.  Cardiovascular:     Rate and Rhythm: Normal rate.  Pulmonary:     Effort: Pulmonary effort is normal.  Skin:    General: Skin is warm.     Capillary Refill: Capillary refill takes less than 2 seconds.  Neurological:     Mental Status: She is alert and oriented to person, place, and time.  Psychiatric:        Mood and Affect:  Mood normal.        Behavior: Behavior normal.     (all labs ordered are listed, but only abnormal results are displayed) Labs Reviewed  COMPREHENSIVE METABOLIC PANEL WITH GFR - Abnormal; Notable for the following components:      Result Value   Glucose, Bld 119 (*)    Total Protein 8.8 (*)    All other components within normal limits  URINE DRUG SCREEN - Abnormal; Notable for the following components:   Benzodiazepines POSITIVE (*)    All other components within normal limits  ETHANOL  CBC    EKG: None  Radiology: No results found.   Procedures   Medications Ordered in the ED - No data to display                                  Medical Decision Making Is a 49 year old female presenting emergency department by  mistake to the behavioral health.  Was discharged from the hospital at [facility] and was supposed to go to behavioral health per charge nurse.  Vital signs reassuring.  She has no specific complaints.  Labs reviewed without significant complaint.  Will send to behavioral health.  Charge nurse has confirmed that patient is supposed to go to behavioral health and they are holding a bed for her.  Amount and/or Complexity of Data Reviewed Labs: ordered.      Final diagnoses:  None    ED Discharge Orders     None          Neysa Caron PARAS, DO 02/04/24 2051  "

## 2024-02-04 NOTE — Tx Team (Signed)
 Initial Treatment Plan 02/04/2024 11:34 PM Holly Mendez FMW:[id number]    PATIENT STRESSORS: Financial difficulties   Occupational concerns     PATIENT STRENGTHS: Motivation for treatment/growth  Supportive family/friends    PATIENT IDENTIFIED PROBLEMS: OD on home meds    I made a mistake I was not trying to hurt myself                 DISCHARGE CRITERIA:  Ability to meet basic life and health needs Motivation to continue treatment in a less acute level of care  PRELIMINARY DISCHARGE PLAN: Return to previous living arrangement Return to previous work or school arrangements  PATIENT/FAMILY INVOLVEMENT: This treatment plan has been presented to and reviewed with the patient, Holly Mendez, The patient and family have been given the opportunity to ask questions and make suggestions.  Holly LOISE Pyo, RN 02/04/2024, 11:34 PM

## 2024-02-04 NOTE — BHH Group Notes (Signed)
 BHH Group Notes:  (Nursing/MHT/Case Management/Adjunct)  Date:  02/04/2024  Time:  10:35 PM  Type of Therapy:  Group Therapy  Participation Level:  Did Not Attend  Participation Quality:  Did not attend   Affect:  Did not attend   Cognitive:  Did not attend  Insight:  None  Engagement in Group:  Did not attend   Modes of Intervention:  Did not attend   Summary of Progress/Problems: Patient did not attend the evening A.A.speaker'Mendez meeting.   Holly Mendez 02/04/2024, 10:35 PM

## 2024-02-04 NOTE — ED Notes (Signed)
 Pt belongings placed in hall d patient belongings section. 1 bag labeled.

## 2024-02-05 ENCOUNTER — Telehealth: Payer: Self-pay | Admitting: Family Medicine

## 2024-02-05 MED ORDER — QUETIAPINE FUMARATE 100 MG PO TABS
100.0000 mg | ORAL_TABLET | Freq: Every day | ORAL | Status: DC
  Administered 2024-02-06: 100 mg via ORAL
  Filled 2024-02-05: qty 1

## 2024-02-05 MED ORDER — LOPERAMIDE HCL 2 MG PO CAPS
4.0000 mg | ORAL_CAPSULE | ORAL | Status: AC | PRN
  Administered 2024-02-05: 4 mg via ORAL
  Filled 2024-02-05: qty 2

## 2024-02-05 MED ORDER — QUETIAPINE FUMARATE 50 MG PO TABS
50.0000 mg | ORAL_TABLET | Freq: Every day | ORAL | Status: AC
  Administered 2024-02-05: 50 mg via ORAL
  Filled 2024-02-05: qty 1

## 2024-02-05 MED ORDER — ONDANSETRON 4 MG PO TBDP
4.0000 mg | ORAL_TABLET | ORAL | Status: AC | PRN
  Administered 2024-02-05: 4 mg via ORAL
  Filled 2024-02-05: qty 1

## 2024-02-05 MED ORDER — CLONIDINE HCL 0.1 MG PO TABS: 0.1000 mg | ORAL_TABLET | Freq: Four times a day (QID) | ORAL | Status: DC | PRN

## 2024-02-05 MED ORDER — CLONIDINE HCL 0.1 MG PO TABS
0.1000 mg | ORAL_TABLET | Freq: Every day | ORAL | Status: DC
  Administered 2024-02-05 – 2024-02-07 (×3): 0.1 mg via ORAL
  Filled 2024-02-05 (×3): qty 1

## 2024-02-05 MED ORDER — PANTOPRAZOLE SODIUM 40 MG PO TBEC
40.0000 mg | DELAYED_RELEASE_TABLET | Freq: Two times a day (BID) | ORAL | Status: DC
  Administered 2024-02-05 – 2024-02-07 (×4): 40 mg via ORAL
  Filled 2024-02-05 (×4): qty 1
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
## 2024-02-05 NOTE — BHH Suicide Risk Assessment (Addendum)
 Suicide Risk Assessment  Admission Assessment    [facility] Admission Suicide Risk Assessment   Nursing information obtained from:    Demographic factors:  Low socioeconomic status Current Mental Status:  NA Loss Factors:  Financial problems / change in socioeconomic status Historical Factors:  NA Risk Reduction Factors:  NA  Total Time spent with patient: 1.5 hours Principal Problem: MDD (major depressive disorder), severe (HCC) Diagnosis:  Principal Problem:   MDD (major depressive disorder), severe (HCC)  Subjective Data: Holly Mendez is a 49 year old female with psychiatric history significant for bipolar disorder who initially presented to [facility] via EMS following an alleged intentional overdose involving prescription medication. Per EMS and law enforcement reports, the patient was observed ingesting a handful of Seroquel  tablets, and police were dispatched after receiving multiple calls reporting that the patient was making suicidal threats. She was admitted to the medical floor for monitoring and treatment and was subsequently medically cleared. Given the circumstances of the overdose, reported suicidal threats, and concern for patient safety, she was transferred to the [facility] for inpatient psychiatric hospitalization to ensure safety, stabilization, and further evaluation. The patient has no prior psychiatric hospitalizations, no prior suicide attempts, and no history of self-harming behaviors. Her medical history is significant for hypertension, GERD, chronic pancreatitis, irritable bowel syndrome, opioid dependence with chronic pain, and a prosthetic left eye.    Per IVC Petition: Suicide attempt with overdose of prescription meds  Continued Clinical Symptoms:  Alcohol Use Disorder Identification Test Final Score (AUDIT): 0 The Alcohol Use Disorders Identification Test, Guidelines for Use in Primary Care, Second Edition.  [facility]). Score between 0-7:  no or low risk or alcohol related problems. Score between 8-15:  moderate risk of alcohol related problems. Score between 16-19:  high risk of alcohol related problems. Score 20 or above:  warrants further diagnostic evaluation for alcohol dependence and treatment.   CLINICAL FACTORS:   Bipolar Disorder:   Depressive phase More than one psychiatric diagnosis Previous Psychiatric Diagnoses and Treatments Medical Diagnoses and Treatments/Surgeries   Musculoskeletal: Strength & Muscle Tone: within normal limits Gait & Station: normal Patient leans: N/A  Psychiatric Specialty Exam:  Presentation  General Appearance: Appropriate for Environment; Casual  Eye Contact:Good  Speech:Clear and Coherent  Speech Volume:Normal  Handedness:No data recorded  Mood and Affect  Mood:Euthymic  Affect:Congruent   Thought Process  Thought Processes:Coherent; Linear  Descriptions of Associations:Intact  Orientation:Full (Time, Place and Person)  Thought Content:Logical  History of Schizophrenia/Schizoaffective disorder:No  Duration of Psychotic Symptoms:No data recorded Hallucinations:Hallucinations: None  Ideas of Reference:None  Suicidal Thoughts:Suicidal Thoughts: No  Homicidal Thoughts:Homicidal Thoughts: No   Sensorium  Memory:Immediate Good; Recent Good; Remote Good  Judgment:Fair  Insight:Fair   Executive Functions  Concentration:Good  Attention Span:Good  Recall:Good  Fund of Knowledge:Good  Language:Good   Psychomotor Activity  Psychomotor Activity:Psychomotor Activity: Normal   Assets  Assets:Desire for Improvement; Housing; Resilience; Social Support   Sleep  Sleep:Sleep: Good    Physical Exam: Physical Exam ROS Blood pressure (!) 155/98, pulse (!) 119, temperature 97.8 F (36.6 C), resp. rate 18, height 5' 4 (1.626 m), weight 108.9 kg, last menstrual period 08/31/2018, SpO2 100%. Body mass  index is 41.2 kg/m.   COGNITIVE FEATURES THAT CONTRIBUTE TO RISK:  None    SUICIDE RISK:   Severe:  Frequent, intense, and enduring suicidal ideation, specific plan, no subjective intent, but some objective markers of intent (i.e., choice of lethal method), the method  is accessible, some limited preparatory behavior, evidence of impaired self-control, severe dysphoria/symptomatology, multiple risk factors present, and few if any protective factors, particularly a lack of social support.  PLAN OF CARE: See H&P for assessment and plan.  I certify that inpatient services furnished can reasonably be expected to improve the patient's condition.   Blair Chiquita Hint, NP 02/05/2024, 3:53 PM

## 2024-02-05 NOTE — Plan of Care (Signed)
   Problem: Coping: Goal: Ability to verbalize frustrations and anger appropriately will improve Outcome: Progressing Goal: Ability to demonstrate self-control will improve Outcome: Progressing   Problem: Safety: Goal: Periods of time without injury will increase Outcome: Progressing

## 2024-02-05 NOTE — H&P (Signed)
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 treatment -- Patient's case to be discussed in multi-disciplinary team meeting -- Observation Level: q15 minute checks -- Vital signs:  q12 hours -- Precautions: suicide, elopement, and assault   2. Psychiatric Treatment:  -- Start Seroquel  50 mg tonight and increase to 100 mg tomorrow -- Continue Depakote  DR 250 mg nightly for mood stabilization -- Discontinue Caplyta  due to a lack of therapeutic benefit and affordability -- Hydroxyzine  25 mg oral, 3 times daily as needed, anxiety -- Trazodone  50 mg, oral, daily at bedtime as needed, sleep -- Haldol  BH Agitation Protocol (See MAR)                   3. Medical Issues  Being Addressed:        # GERD -- Continue Protonix  EC 40 mg twice daily -- Zofran  ODT 4 mg oral for 1 dose for nausea -- Imodium  4 mg oral for 1 dose for loose stools  # HTN  -- Restart clonidine  0.1 mg oral daily -- Clonidine  0.1 mg oral as needed (see MAR for parameters)  -- Continue Voltaren  topical gel 4 times daily as needed for shoulder/neck pain   4. Labs  -- CBC: Unremarkable -- CMP: Blood glucose 119, Total protein 8.8, otherwise normal -- Ethanol: <15 -- TSH and Free T4 (02/01/24): Normal -- UDS: + Benzodiazepines -- Urine Pregnancy: Not ordered  -- QT/QTc: 359/447  New labs ordered: Vitamin D , hemoglobin A1c, lipid panel.   -- The risks/benefits/side-effects/alternatives to this medication were discussed in detail with the patient and time was given for questions. The patient consents to medication trial.  -- FDA -- Metabolic profile and EKG monitoring obtained while on an atypical antipsychotic (BMI: Lipid Panel: HbgA1c: QTc:)  -- Encouraged patient to participate in unit milieu and in scheduled group therapies  -- Short Term Goals: Ability to identify changes in lifestyle to reduce recurrence of condition will improve, Ability to verbalize feelings will improve, Ability to disclose and discuss suicidal ideas, Ability to demonstrate self-control will improve, Ability to identify and develop effective coping behaviors will improve, Ability to maintain clinical measurements within normal limits will improve, Compliance with prescribed medications will improve, and Ability to identify triggers associated with substance abuse/mental health issues will improve -- Long Term Goals: Improvement in symptoms so as ready for discharge     5. Discharge Planning:  -- Social work and case management to assist with discharge planning and identification of hospital follow-up needs prior to discharge -- Estimated LOS: 5-7 days -- Discharge Concerns: Need to establish a safety plan;  Medication compliance and effectiveness -- Discharge  Goals: Return home with outpatient referrals for mental health follow-up including medication management/psychotherapy        Physician Treatment Plan for Primary Diagnosis: Bipolar disorder (HCC) Long Term Goal(s): Improvement in symptoms so as ready for discharge  Short Term Goals: Ability to identify changes in lifestyle to reduce recurrence of condition will improve, Ability to verbalize feelings will improve, Ability to disclose and discuss suicidal ideas, Ability to demonstrate self-control will improve, Ability to identify and develop effective coping behaviors will improve, Ability to maintain clinical measurements within normal limits will improve, Compliance with prescribed medications will improve, and Ability to identify triggers associated with substance abuse/mental health issues will improve    I certify that inpatient services furnished can reasonably be expected to improve the patient's condition.    Blair Chiquita Hint, NP 1/27/202610:59 PM     [1]  Social History Tobacco Use  Smoking Status Every Day   Types: Cigarettes   Passive exposure: Past  Smokeless Tobacco Never  [2]  Allergies Allergen Reactions   Lyrica  [Pregabalin ] Swelling    Swelling of hands and feet   Zithromax [Azithromycin] Other (See Comments)    Severe stomach cramping   Celebrex  [Celecoxib ] Rash   Nabumetone  Swelling, Dermatitis and Rash   Naproxen  Swelling   Norvasc  [Amlodipine  Besylate] Other (See Comments)    Mouth irritation    "

## 2024-02-05 NOTE — Plan of Care (Signed)
   Problem: Education: Goal: Knowledge of [place] [facility] Education information/materials will improve Outcome: Progressing Goal: Emotional status will improve Outcome: Not Progressing Goal: Mental status will improve Outcome: Not Progressing

## 2024-02-05 NOTE — Telephone Encounter (Signed)
 Phone call on 02/04/2024 She called our after hours line and spoke with Dr. Romelle. He notified me she was in hospital and upset, possibly being admitted to [facility]. I called her and spoke with her at [facility]. Tried to discuss situation and reassure her.

## 2024-02-05 NOTE — Group Note (Signed)
 Date:  02/05/2024 Time:  9:17 PM  Group Topic/Focus:  Wrap-Up Group:   The focus of this group is to help patients review their daily goal of treatment and discuss progress on daily workbooks.      Additional Comments:  Pt was encouraged to attend, however the Pt did not attend.   Holly Mendez [place] 02/05/2024, 9:17 PM

## 2024-02-05 NOTE — Group Note (Signed)
 Recreation Therapy Group Note   Group Topic:Animal Assisted Therapy   Group Date: 02/05/2024 Start Time: 9051 End Time: 1028 Facilitators: Corran Lalone-McCall, LRT,CTRS Location: [address] Dayroom   Animal-Assisted Activity (AAA) Program Checklist/Progress Notes Patient Eligibility Criteria Checklist & Daily Group note for Rec Tx Intervention  AAA/T Program Assumption of Risk Form signed by Patient/ or Parent Legal Guardian Yes  Patient understands his/her participation is voluntary Yes  Behavioral Response:    Education: Charity Fundraiser, Appropriate Animal Interaction   Education Outcome: Acknowledges education.    Affect/Mood: N/A   Participation Level: Did not attend    Clinical Observations/Individualized Feedback:      Plan: Continue to engage patient in RT group sessions 2-3x/week.   Matthewjames Petrasek-McCall, LRT,CTRS 02/05/2024 12:42 PM

## 2024-02-05 NOTE — Group Note (Signed)
 LCSW Group Therapy Note   Group Date: 02/05/2024 Start Time: 1100 End Time: 1200   Participation:  did not attend  Type of Therapy:  Group Therapy  Topic:  Stronger Together:  Building Healthy Relationships  Objective:  To explore loneliness, boundaries, and safe ways to build relationships.  Goals: Recognize healthy vs. unhealthy relationships. Learn safe ways to connect with others. Strengthen communication and murphy oil.  Summary:  Participants discussed loneliness, healthy connections, and setting boundaries. They explored safe ways to meet people and shared personal experiences. Key insights were reinforced through discussion and quotes.  Therapeutic Modalities Used: Cognitive Behavioral Therapy (CBT) Elements - Identifying unhealthy relationship patterns, challenging negative thoughts about connection. Dialectical Behavior Therapy (DBT) Elements - Interpersonal effectiveness, setting and maintaining boundaries. Supportive Group Therapy - Peer discussion, shared experiences, and emotional validation.   Denae Zulueta O Everlene Cunning, LCSW 02/05/2024  12:19 PM

## 2024-02-05 NOTE — Progress Notes (Signed)
(  Sleep Hours) - 6.0  (Any PRNs that were needed, meds refused, or side effects to meds)- Hydroxyzine  & Trazodone  PRN  (Any disturbances and when (visitation, over night)- none  (Concerns raised by the patient)- none  (SI/HI/AVH)- denies

## 2024-02-05 NOTE — Group Note (Signed)
 Date:  02/05/2024 Time:  4:13 PM  Group Topic/Focus: Drug and alcohol substance abuse Diagnosis Education:   The focus of this group is to discuss the major disorders that patients maybe diagnosed with.  Group discusses the importance of knowing what one's diagnosis is so that one can understand treatment and better advocate for oneself.    Participation Level:  Did Not Attend  Holly Mendez 02/05/2024, 4:13 PM

## 2024-02-05 NOTE — Group Note (Signed)
 Date:  02/05/2024 Time:  9:46 AM  Group Topic/Focus: Goals and orientation Goals Group:   The focus of this group is to help patients establish daily goals to achieve during treatment and discuss how the patient can incorporate goal setting into their daily lives to aide in recovery. Orientation:   The focus of this group is to educate the patient on the purpose and policies of crisis stabilization and provide a format to answer questions about their admission.  The group details unit policies and expectations of patients while admitted.    Participation Level:  Active  Participation Quality:  Appropriate  Affect:  Appropriate  Cognitive:  Alert  Insight: Appropriate  Engagement in Group:  Engaged  Modes of Intervention:  Discussion and Orientation  Additional Comments:    Holly Mendez 02/05/2024, 9:46 AM

## 2024-02-05 NOTE — Group Note (Signed)
 Date:  02/05/2024 Time:  12:42 PM  Group Topic/Focus: Social work- Investment Banker, Corporate Together:  Building Healthy Relationships  Building Self Esteem:   The Focus of this group is helping patients become aware of the effects of self-esteem on their lives, the things they and others do that enhance or undermine their self-esteem, seeing the relationship between their level of self-esteem and the choices they make and learning ways to enhance self-esteem.    Participation Level:  Did Not Attend  Dolores CHRISTELLA Fredericks 02/05/2024, 12:42 PM

## 2024-02-05 NOTE — Group Note (Signed)
 Date:  02/05/2024 Time:  10:20 AM  Group Topic/Focus: Pet Therapy  Self Care:   The focus of this group is to help patients understand the importance of self-care in order to improve or restore emotional, physical, spiritual, interpersonal, and financial health.    Participation Level:  Did Not Attend   Holly Mendez 02/05/2024, 10:20 AM

## 2024-02-05 NOTE — Group Note (Signed)
 Date:  02/05/2024 Time:  5:54 PM  Group Topic/Focus:  Wellness Toolbox:   The focus of this group is to discuss various aspects of wellness, balancing those aspects and exploring ways to increase the ability to experience wellness.  Patients will create a wellness toolbox for use upon discharge.    Participation Level:  Active  Participation Quality:  Attentive  Affect:  Appropriate  Cognitive:  Alert  Insight: Good  Engagement in Group:  Engaged  Modes of Intervention:  Socialization  Additional Comments:    Holly Mendez 02/05/2024, 5:54 PM

## 2024-02-06 ENCOUNTER — Encounter (HOSPITAL_COMMUNITY): Payer: Self-pay

## 2024-02-06 ENCOUNTER — Ambulatory Visit: Payer: MEDICAID | Admitting: Family Medicine

## 2024-02-06 LAB — LIPID PANEL
Cholesterol: 203 mg/dL — ABNORMAL HIGH (ref 0–200)
HDL: 50 mg/dL
LDL Cholesterol: 124 mg/dL — ABNORMAL HIGH (ref 0–99)
Total CHOL/HDL Ratio: 4.1 ratio
Triglycerides: 145 mg/dL
VLDL: 29 mg/dL (ref 0–40)

## 2024-02-06 LAB — CULTURE, BLOOD (ROUTINE X 2)
Culture: NO GROWTH
Culture: NO GROWTH
Special Requests: ADEQUATE
Special Requests: ADEQUATE

## 2024-02-06 NOTE — Group Note (Signed)
 Recreation Therapy Group Note   Group Topic:Problem Solving  Group Date: 02/06/2024 Start Time: 0932 End Time: 1001 Facilitators: Javeria Briski-McCall, LRT,CTRS Location: [address] Dayroom   Group Topic: Communication, Team Building, Problem Solving  Goal Area(s) Addresses:  Patient will effectively work with peer towards shared goal.  Patient will identify skills used to make activity successful.  Patient will identify how skills used during activity can be used to reach post d/c goals.   Behavioral Response:   Intervention: STEM Activity  Activity: Landing Pad. In teams of 3-5, patients were given 12 plastic drinking straws and an equal length of masking tape. Using the materials provided, patients were asked to build a landing pad to catch a golf ball dropped from approximately 5 feet in the air. All materials were required to be used by the team in their design. LRT facilitated post-activity discussion.  Education: Pharmacist, Community, Scientist, Physiological, Discharge Planning   Education Outcome: Acknowledges education/In group clarification offered/Needs additional education.    Affect/Mood: N/A   Participation Level: Did not attend    Clinical Observations/Individualized Feedback:      Plan: Continue to engage patient in RT group sessions 2-3x/week.   Kewana Sanon-McCall, LRT,CTRS  02/06/2024 12:56 PM

## 2024-02-06 NOTE — Group Note (Signed)
 Date:  02/06/2024 Time:  11:03 AM  Recreational Therapy   Participation Level:  Did Not Attend    Holly Mendez 02/06/2024, 11:03 AM

## 2024-02-06 NOTE — [facility] Group Notes (Signed)
 [facility] Group Notes:  (Nursing/MHT/Case Management/Adjunct)  Date:  02/06/2024  Time:  9:34 PM  Type of Therapy:  NA Group  Participation Level:  Did Not Attend  Participation Quality:    Affect:    Cognitive:    Insight:    Engagement in Group:    Modes of Intervention:    Summary of Progress/Problems:  Holly Mendez 02/06/2024, 9:34 PM

## 2024-02-06 NOTE — Plan of Care (Signed)
   Problem: Safety: Goal: Periods of time without injury will increase Outcome: Progressing

## 2024-02-06 NOTE — Progress Notes (Signed)
(  Sleep Hours) - 7.5 (Any PRNs that were needed, meds refused, or side effects to meds)- PRN Tylenol , PRN Hydroxyzine , and PRN Trazodone  (Any disturbances and when (visitation, over night)- None (Concerns raised by the patient)- None (SI/HI/AVH)- Denies. Contracts for safety

## 2024-02-06 NOTE — Group Note (Signed)
 Date:  02/06/2024 Time:  3:44 PM  Group Topic/Focus:   Pharmacy Group  Pt did attend pharmacy group  Holly Mendez 02/06/2024, 3:44 PM

## 2024-02-06 NOTE — [facility] Group Notes (Signed)
 Adult Psychoeducational Group Note  Date:  02/06/2024 Time:  5:37 PM  Group Topic/Focus:  Wellness Toolbox:   The focus of this group is to discuss various aspects of wellness, balancing those aspects and exploring ways to increase the ability to experience wellness.  Patients will create a wellness toolbox for use upon discharge.  Participation Level:  Active  Participation Quality:  Appropriate  Affect:  Appropriate  Cognitive:  Alert  Insight: Appropriate  Engagement in Group:  None  Modes of Intervention:  Activity  Additional Comments:    Elouise Wolm Morel 02/06/2024, 5:37 PM

## 2024-02-06 NOTE — Progress Notes (Signed)

## 2024-02-06 NOTE — Progress Notes (Signed)
 Holly Mendez  Type of Note: Resources  CSW provided list of food pantry/options in [place]; and [place], [place] areas for both food and electricity/bill assistance.  CSW informed pt that she must apply for some assistance with the DSS office in [place] to be eligible.  Signed: DERICK BLANCH, LCSW 02/06/2024 2:55 PM

## 2024-02-06 NOTE — Plan of Care (Signed)
   Problem: Activity: Goal: Interest or engagement in activities will improve Outcome: Progressing Goal: Sleeping patterns will improve Outcome: Progressing

## 2024-02-06 NOTE — BH IP Treatment Plan (Signed)
 Interdisciplinary Treatment and Diagnostic Plan Update  02/06/2024 Time of Session: 1005 AM Holly Mendez MRN: [id number]  Principal Diagnosis: Bipolar disorder [facility])  Secondary Diagnoses: Principal Problem:   Bipolar disorder (HCC) Active Problems:   Essential hypertension   GERD (gastroesophageal reflux disease)   Current Medications:  Current Facility-Administered Medications  Medication Dose Route Frequency Provider Last Rate Last Admin   acetaminophen  (TYLENOL ) tablet 650 mg  650 mg Oral Q6H PRN Coleman, Carolyn H, NP   650 mg at 02/06/24 0919   alum & mag hydroxide-simeth (MAALOX/MYLANTA) 200-200-20 MG/5ML suspension 30 mL  30 mL Oral Q4H PRN Coleman, Carolyn H, NP       Chlorhexidine  Gluconate Cloth 2 % PADS 6 each  6 each Topical Q0600 Mardy Elveria DEL, NP       cloNIDine  (CATAPRES ) tablet 0.1 mg  0.1 mg Oral Daily Bennett, Christal H, NP   0.1 mg at 02/06/24 9167   cloNIDine  (CATAPRES ) tablet 0.1 mg  0.1 mg Oral Q6H PRN Bennett, Christal H, NP       diclofenac  Sodium (VOLTAREN ) 1 % topical gel 4 g  4 g Topical QID PRN Coleman, Carolyn H, NP   4 g at 02/05/24 1023   haloperidol  (HALDOL ) tablet 5 mg  5 mg Oral TID PRN Mardy Elveria DEL, NP       And   diphenhydrAMINE  (BENADRYL ) capsule 50 mg  50 mg Oral TID PRN Mardy Elveria DEL, NP       haloperidol  lactate (HALDOL ) injection 5 mg  5 mg Intramuscular TID PRN Mardy Elveria DEL, NP       And   diphenhydrAMINE  (BENADRYL ) injection 50 mg  50 mg Intramuscular TID PRN Mardy Elveria DEL, NP       And   LORazepam  (ATIVAN ) injection 2 mg  2 mg Intramuscular TID PRN Coleman, Carolyn H, NP       haloperidol  lactate (HALDOL ) injection 10 mg  10 mg Intramuscular TID PRN Mardy Elveria DEL, NP       And   diphenhydrAMINE  (BENADRYL ) injection 50 mg  50 mg Intramuscular TID PRN Mardy Elveria DEL, NP       And   LORazepam  (ATIVAN ) injection 2 mg  2 mg Intramuscular TID PRN Coleman, Carolyn H, NP       divalproex  (DEPAKOTE ) DR  tablet 250 mg  250 mg Oral QHS Coleman, Carolyn H, NP   250 mg at 02/05/24 2117   hydrOXYzine  (ATARAX ) tablet 25 mg  25 mg Oral TID PRN Coleman, Carolyn H, NP   25 mg at 02/05/24 2117   magnesium  hydroxide (MILK OF MAGNESIA) suspension 30 mL  30 mL Oral Daily PRN Mardy Elveria DEL, NP       pantoprazole  (PROTONIX ) EC tablet 40 mg  40 mg Oral BID Bennett, Christal H, NP   40 mg at 02/06/24 9356   QUEtiapine  (SEROQUEL ) tablet 100 mg  100 mg Oral QHS Bennett, Christal H, NP       traZODone  (DESYREL ) tablet 50 mg  50 mg Oral QHS PRN Coleman, Carolyn H, NP   50 mg at 02/05/24 2117   PTA Medications: Medications Prior to Admission  Medication Sig Dispense Refill Last Dose/Taking   cloNIDine  (CATAPRES  - DOSED IN MG/24 HR) 0.1 mg/24hr patch Place 0.1 mg onto the skin once a week.   Taking   cloNIDine  (CATAPRES ) 0.1 MG tablet Take 0.1 mg by mouth 2 (two) times daily.   Taking   divalproex  (DEPAKOTE ) 125 MG DR  tablet Take 125 mg by mouth daily.   Taking   estradiol  (ESTRACE ) 1 MG tablet Take 1 mg by mouth daily.   Taking   metoCLOPramide  (REGLAN ) 10 MG tablet Take 10 mg by mouth every 6 (six) hours as needed for nausea.   Taking As Needed   QUEtiapine  (SEROQUEL ) 200 MG tablet Take 200 mg by mouth 2 (two) times daily.   Taking   SYMBICORT  80-4.5 MCG/ACT inhaler INHALE 2 PUFFS INTO THE LUNGS TWICE DAILY AS NEEDED. 10.2 g 12 Past Month   cetirizine  (ZYRTEC ) 10 MG tablet Take 1 tablet (10 mg total) by mouth in the morning. 90 tablet 3    clonazePAM  (KLONOPIN ) 0.5 MG tablet Take 0.5 mg by mouth 2 (two) times daily.      esomeprazole  (NEXIUM ) 40 MG capsule TAKE 1 CAPSULE(40 MG) BY MOUTH TWICE DAILY BEFORE A MEAL 60 capsule 2    lumateperone  tosylate (CAPLYTA ) 42 MG capsule Take 1 capsule (42 mg total) by mouth daily.      oxyCODONE  (OXY IR/ROXICODONE ) 5 MG immediate release tablet Take 5 mg by mouth 4 (four) times daily as needed.      VENTOLIN  HFA 108 (90 Base) MCG/ACT inhaler INHALE TWO PUFFS INTO THE LUNGS  EVERY 6 HOURS AS NEEDED WHEEZING OR SHORTNESS OF BREATH 18 g 12     Patient Stressors: Financial difficulties   Occupational concerns    Patient Strengths: Motivation for treatment/growth  Supportive family/friends   Treatment Modalities: Medication Management, Group therapy, Case management,  1 to 1 session with clinician, Psychoeducation, Recreational therapy.   Physician Treatment Plan for Primary Diagnosis: Bipolar disorder (HCC) Long Term Goal(s): Improvement in symptoms so as ready for discharge   Short Term Goals: Ability to identify changes in lifestyle to reduce recurrence of condition will improve Ability to verbalize feelings will improve Ability to disclose and discuss suicidal ideas Ability to demonstrate self-control will improve Ability to identify and develop effective coping behaviors will improve Ability to maintain clinical measurements within normal limits will improve Compliance with prescribed medications will improve Ability to identify triggers associated with substance abuse/mental health issues will improve  Medication Management: Evaluate patient's response, side effects, and tolerance of medication regimen.  Therapeutic Interventions: 1 to 1 sessions, Unit Group sessions and Medication administration.  Evaluation of Outcomes: Progressing  Physician Treatment Plan for Secondary Diagnosis: Principal Problem:   Bipolar disorder (HCC) Active Problems:   Essential hypertension   GERD (gastroesophageal reflux disease)  Long Term Goal(s): Improvement in symptoms so as ready for discharge   Short Term Goals: Ability to identify changes in lifestyle to reduce recurrence of condition will improve Ability to verbalize feelings will improve Ability to disclose and discuss suicidal ideas Ability to demonstrate self-control will improve Ability to identify and develop effective coping behaviors will improve Ability to maintain clinical measurements within  normal limits will improve Compliance with prescribed medications will improve Ability to identify triggers associated with substance abuse/mental health issues will improve     Medication Management: Evaluate patient's response, side effects, and tolerance of medication regimen.  Therapeutic Interventions: 1 to 1 sessions, Unit Group sessions and Medication administration.  Evaluation of Outcomes: Progressing   RN Treatment Plan for Primary Diagnosis: Bipolar disorder (HCC) Long Term Goal(s): Knowledge of disease and therapeutic regimen to maintain health will improve  Short Term Goals: Ability to verbalize feelings will improve and Ability to disclose and discuss suicidal ideas  Medication Management: RN will administer medications as ordered by provider,  will assess and evaluate patient's response and provide education to patient for prescribed medication. RN will report any adverse and/or side effects to prescribing provider.  Therapeutic Interventions: 1 on 1 counseling sessions, Psychoeducation, Medication administration, Evaluate responses to treatment, Monitor vital signs and CBGs as ordered, Perform/monitor CIWA, COWS, AIMS and Fall Risk screenings as ordered, Perform wound care treatments as ordered.  Evaluation of Outcomes: Progressing   LCSW Treatment Plan for Primary Diagnosis: Bipolar disorder (HCC) Long Term Goal(s): Safe transition to appropriate next level of care at discharge, Engage patient in therapeutic group addressing interpersonal concerns.  Short Term Goals: Engage patient in aftercare planning with referrals and resources, Identify triggers associated with mental health/substance abuse issues, and Increase skills for wellness and recovery  Therapeutic Interventions: Assess for all discharge needs, 1 to 1 time with Social worker, Explore available resources and support systems, Assess for adequacy in community support network, Educate family and significant  other(s) on suicide prevention, Complete Psychosocial Assessment, Interpersonal group therapy.  Evaluation of Outcomes: Progressing   Progress in Treatment: Attending groups: No. Participating in groups: No. Taking medication as prescribed: Yes. Toleration medication: Yes. Family/Significant other contact made: No, will contact:  dani Ozell Dys [phone] Patient understands diagnosis: Yes. Discussing patient identified problems/goals with staff: Yes. Medical problems stabilized or resolved: Yes. Denies suicidal/homicidal ideation: Yes. Issues/concerns per patient self-inventory: No.  New problem(s) identified: No, Describe:  none  New Short Term/Long Term Goal(s): medication stabilization, elimination of SI thoughts, development of comprehensive mental wellness plan.    Patient Goals:  I needed my meds adjusted and I need to keep them organized when I get back home tomorrow  Discharge Plan or Barriers: Patient recently admitted. CSW will continue to follow and assess for appropriate referrals and possible discharge planning.    Reason for Continuation of Hospitalization: Anxiety Depression Medication stabilization  Estimated Length of Stay: 1-2 more days  Last 3 [place] Suicide Severity Risk Score: Flowsheet Row [facility] (Current) [facility] Most recent reading at 02/04/2024 11:00 PM ED from 02/04/2024 in [facility] Department at [facility] Most recent reading at 02/04/2024  7:27 PM ED to Hosp-Admission (Discharged) from 01/31/2024 in [facility] Most recent reading at 01/31/2024  8:07 PM  C-SSRS RISK CATEGORY No Risk No Risk High Risk    Last PHQ 2/9 Scores:    12/19/2023    9:23 AM 11/21/2023    9:23 AM 05/02/2023    9:27 AM  Depression screen PHQ 2/9  Decreased Interest 2 0 0  Down, Depressed, Hopeless 1 0 0  PHQ - 2 Score 3 0 0  Altered sleeping 2 2 3    Tired, decreased energy 0 2 3  Change in appetite 1 0 1  Feeling bad or failure about yourself  0 0 0  Trouble concentrating 2 0 1  Moving slowly or fidgety/restless 0 0 0  Suicidal thoughts 0 0 0  PHQ-9 Score 8 4 8    Difficult doing work/chores Very difficult Somewhat difficult      Data saved with a previous flowsheet row definition    Scribe for Treatment Team: Jenkins LULLA Primer, LCSWA 02/06/2024 11:50 AM
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
## 2024-02-06 NOTE — Group Note (Signed)
 Date:  02/06/2024 Time:  10:33 AM  Group Topic/Focus:  Goals Group:   The focus of this group is to help patients establish daily goals to achieve during treatment and discuss how the patient can incorporate goal setting into their daily lives to aide in recovery. Orientation:   The focus of this group is to educate the patient on the purpose and policies of crisis stabilization and provide a format to answer questions about their admission.  The group details unit policies and expectations of patients while admitted.    Participation Level:  Did Not Attend   Holly Mendez Mars 02/06/2024, 10:33 AM

## 2024-02-07 ENCOUNTER — Encounter: Payer: Self-pay | Admitting: Family Medicine

## 2024-02-07 DIAGNOSIS — Z609 Problem related to social environment, unspecified: Secondary | ICD-10-CM | POA: Insufficient documentation

## 2024-02-07 DIAGNOSIS — F1911 Other psychoactive substance abuse, in remission: Secondary | ICD-10-CM | POA: Insufficient documentation

## 2024-02-07 LAB — HEMOGLOBIN A1C
Hgb A1c MFr Bld: 6.2 % — ABNORMAL HIGH (ref 4.8–5.6)
Mean Plasma Glucose: 131.24 mg/dL

## 2024-02-07 LAB — VITAMIN D 25 HYDROXY (VIT D DEFICIENCY, FRACTURES): Vit D, 25-Hydroxy: 17.3 ng/mL — ABNORMAL LOW (ref 30–100)

## 2024-02-07 MED ORDER — HYDROXYZINE HCL 25 MG PO TABS: 25.0000 mg | ORAL_TABLET | Freq: Three times a day (TID) | ORAL | 0 refills | Status: AC | PRN

## 2024-02-07 MED ORDER — DICLOFENAC SODIUM 1 % EX GEL: 4.0000 g | Freq: Four times a day (QID) | CUTANEOUS | Status: AC | PRN

## 2024-02-07 MED ORDER — TRAZODONE HCL 50 MG PO TABS: 50.0000 mg | ORAL_TABLET | Freq: Every evening | ORAL | 0 refills | Status: AC | PRN

## 2024-02-07 MED ORDER — DIVALPROEX SODIUM 250 MG PO DR TAB: 250.0000 mg | DELAYED_RELEASE_TABLET | Freq: Every day | ORAL | 0 refills | Status: AC

## 2024-02-07 MED ORDER — QUETIAPINE FUMARATE 100 MG PO TABS: 100.0000 mg | ORAL_TABLET | Freq: Every day | ORAL | 0 refills | Status: AC

## 2024-02-07 NOTE — Group Note (Signed)
 Date:  02/07/2024 Time:  9:45 AM  Group Topic/Focus: Goals and orientation Goals Group:   The focus of this group is to help patients establish daily goals to achieve during treatment and discuss how the patient can incorporate goal setting into their daily lives to aide in recovery. Orientation:   The focus of this group is to educate the patient on the purpose and policies of crisis stabilization and provide a format to answer questions about their admission.  The group details unit policies and expectations of patients while admitted.    Participation Level:  Did Not Attend   Holly Mendez 02/07/2024, 9:45 AM
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
## 2024-02-07 NOTE — Plan of Care (Signed)
" °  Problem: Education: Goal: Knowledge of [place] [facility] Education information/materials will improve Outcome: Adequate for Discharge Goal: Emotional status will improve Outcome: Adequate for Discharge Goal: Mental status will improve Outcome: Adequate for Discharge Goal: Verbalization of understanding the information provided will improve Outcome: Adequate for Discharge   Problem: Activity: Goal: Interest or engagement in activities will improve Outcome: Adequate for Discharge Goal: Sleeping patterns will improve Outcome: Adequate for Discharge   Problem: Coping: Goal: Ability to verbalize frustrations and anger appropriately will improve Outcome: Adequate for Discharge Goal: Ability to demonstrate self-control will improve Outcome: Adequate for Discharge   Problem: Health Behavior/Discharge Planning: Goal: Identification of resources available to assist in meeting health care needs will improve Outcome: Adequate for Discharge Goal: Compliance with treatment plan for underlying cause of condition will improve Outcome: Adequate for Discharge   Problem: Physical Regulation: Goal: Ability to maintain clinical measurements within normal limits will improve Outcome: Adequate for Discharge   Problem: Safety: Goal: Periods of time without injury will increase Outcome: Adequate for Discharge   Patient discharged to home this shift. Care plan complete.  "

## 2024-02-07 NOTE — Progress Notes (Signed)
 Patient ID: Holly Mendez, female   DOB: May 26, 1975, 49 y.o.   MRN: [id number] Patient discharged to home/self care. Patient denies SI, HI, and AVH upon discharge. Patient acknowledged understanding of all discharge instructions and receipt of her personal belongings. Patient was transported via taxi.

## 2024-02-07 NOTE — Group Note (Signed)
 Date:  02/07/2024 Time:  9:52 AM  Group Topic/Focus: Nutrient group Healthy Communication:   The focus of this group is to discuss communication, barriers to communication, as well as healthy ways to communicate with others.    Participation Level:  Did Not Attend   Holly Mendez 02/07/2024, 9:52 AM

## 2024-02-07 NOTE — Progress Notes (Signed)
" °  [facility] Adult Case Management Discharge Plan :  Will you be returning to the same living situation after discharge:  Yes,  Pt returning to home/apartment. At discharge, do you have transportation home?: Yes,  Pt will be provided with transportation via taxi voucher. Do you have the ability to pay for your medications: Yes,  Pt has medical insurance.  Release of information consent forms completed and in the chart;  Patient's signature needed at discharge.  Patient to Follow up at:  Follow-up Information     [facility]. Go on 02/08/2024.   Why: You have a hospital follow up assessment on 02/08/24 at 9:00 am, to obtain appointments for therapy and medication management services. Contact information: [address] [place] [address] [phone]         [facility] Follow up on 02/13/2024.   Why: Please call your provider on 02/13/24 at 9:00 am to schedule an appointment for medication management services with the [facility] location. Contact information: [address] [place] [address] [place] [address] [phone]                 Next level of care provider has access to [facility] Link:no  Safety Planning and Suicide Prevention discussed: Yes,  Completed with Michael McClinton, fiancee.     Has patient been referred to the Quitline?: Patient refused referral for treatment  Patient has been referred for addiction treatment: Patient refused referral for treatment.  Derick JONELLE Blanch, LCSW 02/07/2024, 8:52 AM "

## 2024-02-07 NOTE — [facility] Suicide Risk Assessment (Signed)
 Suicide Risk Assessment  Discharge Assessment    [employer] [facility] [employer] Discharge Suicide Risk Assessment   Principal Problem: Bipolar disorder [facility]) Discharge Diagnoses: Principal Problem:   Bipolar disorder (HCC) Active Problems:   Essential hypertension   GERD (gastroesophageal reflux disease)  Total Time spent with patient: 45 minutes  Musculoskeletal: Strength & Muscle Tone: within normal limits Gait & Station: normal Patient leans: N/A  Psychiatric Specialty Exam  Presentation  General Appearance:  Appropriate for Environment; Casual; Fairly Groomed  Eye Contact: Good  Speech: Clear and Coherent; Normal Rate  Speech Volume: Normal  Handedness:Right   Mood and Affect  Mood: Euthymic  Duration of Depression Symptoms: Greater than two weeks  Affect: Appropriate; Congruent   Thought Process  Thought Processes: Coherent; Goal Directed; Linear  Descriptions of Associations:Intact  Orientation:Full (Time, Place and Person)  Thought Content:Logical  History of Schizophrenia/Schizoaffective disorder:No  Duration of Psychotic Symptoms:No data recorded Hallucinations:Hallucinations: None  Ideas of Reference:None  Suicidal Thoughts:Suicidal Thoughts: No  Homicidal Thoughts:Homicidal Thoughts: No   Sensorium  Memory: Immediate Good; Recent Good; Remote Good  Judgment: Good  Insight: Good   Executive Functions  Concentration: Good  Attention Span: Good  Recall: Good  Fund of Knowledge: Fair  Language: Good  Psychomotor Activity  Psychomotor Activity:Psychomotor Activity: Normal   Assets  Assets: Communication Skills; Desire for Improvement; Housing; Physical Health; Resilience; Social Support   Sleep  Sleep:Sleep: Good  Estimated Sleeping Duration (Last 24 Hours): 6.00-7.50 hours  Physical Exam: See H&P.  Blood pressure 113/81, pulse 96, temperature 97.8 F (36.6 C), resp. rate 18, height 5' 4 (1.626 m), weight 108.9 kg, last  menstrual period 08/31/2018, SpO2 99%. Body mass index is 41.2 kg/m.  Mental Status Per Nursing Assessment::   On Admission:  NA  Demographic Factors:  Adolescent or young adult and Low socioeconomic status  Loss Factors: NA  Historical Factors: NA  Risk Reduction Factors:   Sense of responsibility to family, Living with another person, especially a relative, Positive social support, Positive therapeutic relationship, and Positive coping skills or problem solving skills  Continued Clinical Symptoms:  Previous Psychiatric Diagnoses and Treatments  Cognitive Features That Contribute To Risk:  Thought constriction (tunnel vision)    Suicide Risk:  Minimal: No identifiable suicidal ideation.  Patients presenting with no risk factors but with morbid ruminations; may be classified as minimal risk based on the severity of the depressive symptoms   Follow-up Information     Services, [facility]. Go on 02/08/2024.   Why: You have a hospital follow up assessment on 02/08/24 at 9:00 am, to obtain appointments for therapy and medication management services. Contact information: [address] [place] [address] [phone]         [facility] Follow up on 02/13/2024.   Why: Please call your provider on 02/13/24 at 9:00 am to schedule an appointment for medication management services with the [facility] location. Contact information: [address] [place] [address] [address] [address] [place] [address] [phone]                Plan Of Care/Follow-up recommendations:  See the discharge recommendation above.  Mac Bolster, NP, pmhnp, fnp-bc. 02/07/2024, 11:01 AM

## 2024-02-07 NOTE — [facility] Suicide Risk Assessment (Signed)
 [facility] INPATIENT:  Family/Significant Other Suicide Prevention Education  Suicide Prevention Education:  Education Completed; Michael McClinton (fiancee),  (name of family member/significant other) has been identified by the patient as the family member/significant other with whom the patient will be residing, and identified as the person(s) who will aid the patient in the event of a mental health crisis (suicidal ideations/suicide attempt).  With written consent from the patient, the family member/significant other has been provided the following suicide prevention education, prior to the and/or following the discharge of the patient.  The suicide prevention education provided includes the following: Suicide risk factors Suicide prevention and interventions National Suicide Hotline telephone number [facility] assessment telephone number [facility] Emergency Assistance 911 [facility] and/or Residential Mobile Crisis Unit telephone number  Request made of family/significant other to: Remove weapons (e.g., guns, rifles, knives), all items previously/currently identified as safety concern.   Remove drugs/medications (over-the-counter, prescriptions, illicit drugs), all items previously/currently identified as a safety concern.  The family member/significant other verbalizes understanding of the suicide prevention education information provided.  The family member/significant other agrees to remove the items of safety concern listed above.  Derick JONELLE Blanch, LCSW 02/07/2024, 8:55 AM

## 2024-02-14 ENCOUNTER — Ambulatory Visit: Payer: MEDICAID | Admitting: Gastroenterology
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
## 2024-02-20 ENCOUNTER — Ambulatory Visit: Payer: MEDICAID | Admitting: Orthopaedic Surgery
